# Patient Record
Sex: Female | Born: 1935 | Race: White | Hispanic: No | Smoking: Former smoker
Health system: Southern US, Community
[De-identification: ages and names within clinical notes are randomized; demographics above are authoritative.]

## PROBLEM LIST (undated history)

## (undated) DIAGNOSIS — K219 Gastro-esophageal reflux disease without esophagitis: Secondary | ICD-10-CM

## (undated) DIAGNOSIS — G609 Hereditary and idiopathic neuropathy, unspecified: Secondary | ICD-10-CM

## (undated) DIAGNOSIS — Z85828 Personal history of other malignant neoplasm of skin: Secondary | ICD-10-CM

## (undated) DIAGNOSIS — M1990 Unspecified osteoarthritis, unspecified site: Secondary | ICD-10-CM

## (undated) DIAGNOSIS — K819 Cholecystitis, unspecified: Secondary | ICD-10-CM

## (undated) DIAGNOSIS — H269 Unspecified cataract: Secondary | ICD-10-CM

## (undated) DIAGNOSIS — T4145XA Adverse effect of unspecified anesthetic, initial encounter: Secondary | ICD-10-CM

## (undated) DIAGNOSIS — I499 Cardiac arrhythmia, unspecified: Secondary | ICD-10-CM

## (undated) HISTORY — DX: Hereditary and idiopathic neuropathy, unspecified: G60.9

## (undated) HISTORY — DX: Gastro-esophageal reflux disease without esophagitis: K21.9

## (undated) HISTORY — DX: Personal history of other malignant neoplasm of skin: Z85.828

## (undated) HISTORY — DX: Unspecified osteoarthritis, unspecified site: M19.90

## (undated) HISTORY — PX: TONSILLECTOMY: SUR1361

## (undated) HISTORY — PX: ABDOMINAL HYSTERECTOMY: SHX81

## (undated) HISTORY — PX: BREAST CYST ASPIRATION: SHX578

---

## 1997-12-13 ENCOUNTER — Other Ambulatory Visit: Admission: RE | Admit: 1997-12-13 | Discharge: 1997-12-13 | Payer: Self-pay | Admitting: Gynecology

## 1998-12-19 ENCOUNTER — Other Ambulatory Visit: Admission: RE | Admit: 1998-12-19 | Discharge: 1998-12-19 | Payer: Self-pay | Admitting: Gynecology

## 1999-12-17 ENCOUNTER — Encounter: Payer: Self-pay | Admitting: Gynecology

## 1999-12-17 ENCOUNTER — Encounter: Admission: RE | Admit: 1999-12-17 | Discharge: 1999-12-17 | Payer: Self-pay | Admitting: Gynecology

## 1999-12-30 ENCOUNTER — Other Ambulatory Visit: Admission: RE | Admit: 1999-12-30 | Discharge: 1999-12-30 | Payer: Self-pay | Admitting: Gynecology

## 2000-12-19 ENCOUNTER — Encounter: Payer: Self-pay | Admitting: Gynecology

## 2000-12-19 ENCOUNTER — Encounter: Admission: RE | Admit: 2000-12-19 | Discharge: 2000-12-19 | Payer: Self-pay | Admitting: Gynecology

## 2001-01-04 ENCOUNTER — Other Ambulatory Visit: Admission: RE | Admit: 2001-01-04 | Discharge: 2001-01-04 | Payer: Self-pay | Admitting: Gynecology

## 2001-12-05 ENCOUNTER — Encounter: Admission: RE | Admit: 2001-12-05 | Discharge: 2001-12-05 | Payer: Self-pay | Admitting: Gynecology

## 2001-12-05 ENCOUNTER — Encounter: Payer: Self-pay | Admitting: Gynecology

## 2002-01-02 ENCOUNTER — Ambulatory Visit (HOSPITAL_COMMUNITY): Admission: RE | Admit: 2002-01-02 | Discharge: 2002-01-02 | Payer: Self-pay | Admitting: Gastroenterology

## 2002-01-08 ENCOUNTER — Other Ambulatory Visit: Admission: RE | Admit: 2002-01-08 | Discharge: 2002-01-08 | Payer: Self-pay | Admitting: Gynecology

## 2002-02-06 ENCOUNTER — Ambulatory Visit (HOSPITAL_COMMUNITY): Admission: RE | Admit: 2002-02-06 | Discharge: 2002-02-06 | Payer: Self-pay | Admitting: Gastroenterology

## 2002-12-11 ENCOUNTER — Encounter: Payer: Self-pay | Admitting: Gynecology

## 2002-12-11 ENCOUNTER — Encounter: Admission: RE | Admit: 2002-12-11 | Discharge: 2002-12-11 | Payer: Self-pay | Admitting: Gynecology

## 2002-12-14 ENCOUNTER — Encounter: Payer: Self-pay | Admitting: Gynecology

## 2002-12-14 ENCOUNTER — Encounter: Admission: RE | Admit: 2002-12-14 | Discharge: 2002-12-14 | Payer: Self-pay | Admitting: Gynecology

## 2003-06-21 ENCOUNTER — Encounter (INDEPENDENT_AMBULATORY_CARE_PROVIDER_SITE_OTHER): Payer: Self-pay | Admitting: Specialist

## 2003-06-21 ENCOUNTER — Observation Stay (HOSPITAL_COMMUNITY): Admission: RE | Admit: 2003-06-21 | Discharge: 2003-06-22 | Payer: Self-pay | Admitting: Gynecology

## 2004-01-02 ENCOUNTER — Encounter: Admission: RE | Admit: 2004-01-02 | Discharge: 2004-01-02 | Payer: Self-pay | Admitting: Gynecology

## 2004-01-30 ENCOUNTER — Other Ambulatory Visit: Admission: RE | Admit: 2004-01-30 | Discharge: 2004-01-30 | Payer: Self-pay | Admitting: Gynecology

## 2005-01-06 ENCOUNTER — Encounter: Admission: RE | Admit: 2005-01-06 | Discharge: 2005-01-06 | Payer: Self-pay | Admitting: Gynecology

## 2005-01-22 ENCOUNTER — Encounter: Admission: RE | Admit: 2005-01-22 | Discharge: 2005-01-22 | Payer: Self-pay | Admitting: Gynecology

## 2005-04-26 DIAGNOSIS — H269 Unspecified cataract: Secondary | ICD-10-CM

## 2005-04-26 HISTORY — DX: Unspecified cataract: H26.9

## 2005-04-26 HISTORY — PX: EYE SURGERY: SHX253

## 2005-07-28 ENCOUNTER — Encounter: Admission: RE | Admit: 2005-07-28 | Discharge: 2005-07-28 | Payer: Self-pay | Admitting: Gynecology

## 2006-01-18 ENCOUNTER — Encounter: Admission: RE | Admit: 2006-01-18 | Discharge: 2006-01-18 | Payer: Self-pay | Admitting: Gynecology

## 2006-02-08 ENCOUNTER — Other Ambulatory Visit: Admission: RE | Admit: 2006-02-08 | Discharge: 2006-02-08 | Payer: Self-pay | Admitting: Gynecology

## 2006-05-11 ENCOUNTER — Ambulatory Visit: Payer: Self-pay | Admitting: Internal Medicine

## 2007-01-24 ENCOUNTER — Encounter: Admission: RE | Admit: 2007-01-24 | Discharge: 2007-01-24 | Payer: Self-pay | Admitting: Gynecology

## 2007-01-31 ENCOUNTER — Ambulatory Visit: Payer: Self-pay | Admitting: Family Medicine

## 2007-01-31 DIAGNOSIS — H8110 Benign paroxysmal vertigo, unspecified ear: Secondary | ICD-10-CM | POA: Insufficient documentation

## 2008-01-30 ENCOUNTER — Encounter: Admission: RE | Admit: 2008-01-30 | Discharge: 2008-01-30 | Payer: Self-pay | Admitting: Gynecology

## 2008-03-20 ENCOUNTER — Encounter: Admission: RE | Admit: 2008-03-20 | Discharge: 2008-03-20 | Payer: Self-pay | Admitting: Gastroenterology

## 2008-04-11 ENCOUNTER — Ambulatory Visit: Payer: Self-pay | Admitting: Internal Medicine

## 2008-04-11 LAB — CONVERTED CEMR LAB
BUN: 11 mg/dL (ref 6–23)
CO2: 20 meq/L (ref 19–32)
Calcium: 9 mg/dL (ref 8.4–10.5)
Chloride: 101 meq/L (ref 96–112)
Creatinine, Ser: 0.71 mg/dL (ref 0.40–1.20)
Glucose, Bld: 101 mg/dL — ABNORMAL HIGH (ref 70–99)
Potassium: 4.9 meq/L (ref 3.5–5.3)
Sodium: 137 meq/L (ref 135–145)

## 2008-08-07 ENCOUNTER — Ambulatory Visit: Payer: Self-pay | Admitting: Family Medicine

## 2008-08-07 DIAGNOSIS — J209 Acute bronchitis, unspecified: Secondary | ICD-10-CM | POA: Insufficient documentation

## 2009-01-30 ENCOUNTER — Encounter: Admission: RE | Admit: 2009-01-30 | Discharge: 2009-01-30 | Payer: Self-pay | Admitting: Gynecology

## 2009-02-11 ENCOUNTER — Encounter: Payer: Self-pay | Admitting: Internal Medicine

## 2009-02-12 ENCOUNTER — Encounter: Payer: Self-pay | Admitting: *Deleted

## 2009-02-12 LAB — CONVERTED CEMR LAB
Cholesterol: 259 mg/dL
Direct LDL: 153 mg/dL
Triglycerides: 195 mg/dL

## 2009-02-20 ENCOUNTER — Encounter: Payer: Self-pay | Admitting: Internal Medicine

## 2009-02-26 ENCOUNTER — Encounter: Payer: Self-pay | Admitting: *Deleted

## 2009-06-27 ENCOUNTER — Encounter: Payer: Self-pay | Admitting: Internal Medicine

## 2009-07-14 ENCOUNTER — Encounter: Admission: RE | Admit: 2009-07-14 | Discharge: 2009-07-14 | Payer: Self-pay | Admitting: Gastroenterology

## 2009-09-09 ENCOUNTER — Telehealth: Payer: Self-pay | Admitting: Internal Medicine

## 2010-02-03 ENCOUNTER — Encounter: Admission: RE | Admit: 2010-02-03 | Discharge: 2010-02-03 | Payer: Self-pay | Admitting: Gynecology

## 2010-02-10 ENCOUNTER — Encounter: Admission: RE | Admit: 2010-02-10 | Discharge: 2010-02-10 | Payer: Self-pay | Admitting: Gynecology

## 2010-05-04 ENCOUNTER — Ambulatory Visit
Admission: RE | Admit: 2010-05-04 | Discharge: 2010-05-04 | Payer: Self-pay | Source: Home / Self Care | Attending: Internal Medicine | Admitting: Internal Medicine

## 2010-05-15 ENCOUNTER — Ambulatory Visit
Admission: RE | Admit: 2010-05-15 | Discharge: 2010-05-15 | Payer: Self-pay | Source: Home / Self Care | Attending: Internal Medicine | Admitting: Internal Medicine

## 2010-05-15 DIAGNOSIS — M6740 Ganglion, unspecified site: Secondary | ICD-10-CM | POA: Insufficient documentation

## 2010-05-15 DIAGNOSIS — M1990 Unspecified osteoarthritis, unspecified site: Secondary | ICD-10-CM | POA: Insufficient documentation

## 2010-05-15 DIAGNOSIS — M25676 Stiffness of unspecified foot, not elsewhere classified: Secondary | ICD-10-CM

## 2010-05-15 DIAGNOSIS — M25673 Stiffness of unspecified ankle, not elsewhere classified: Secondary | ICD-10-CM | POA: Insufficient documentation

## 2010-05-16 ENCOUNTER — Encounter: Payer: Self-pay | Admitting: Gynecology

## 2010-05-17 ENCOUNTER — Encounter: Payer: Self-pay | Admitting: Gynecology

## 2010-05-20 ENCOUNTER — Encounter: Payer: Self-pay | Admitting: Internal Medicine

## 2010-05-26 NOTE — Assessment & Plan Note (Signed)
Summary: pT GOT UP TODAY UNEASY ONLEGS/DIZZY/NN   Vital Signs:  Patient Profile:   77 Years Old Female Weight:      150 pounds Temp:     98.5 degrees F oral Pulse rate:   68 / minute Pulse rhythm:   regular BP sitting:   132 / 74  (left arm) Cuff size:   regular  Vitals Entered By: Alfred Levins, CMA (January 31, 2007 11:46 AM)                 Chief Complaint:  dizzy yesterday and today and some nausea.  History of Present Illness: 2 days of dizziness when she moves, is fine when she is still. Gets a little nauseated. No fever or HA.  Current Allergies: ! TERRAMYCIN     Review of Systems      See HPI   Physical Exam  General:     Well-developed,well-nourished,in no acute distress; alert,appropriate and cooperative throughout examination Head:     Normocephalic and atraumatic without obvious abnormalities. No apparent alopecia or balding. Eyes:     No corneal or conjunctival inflammation noted. EOMI. Perrla. Funduscopic exam benign, without hemorrhages, exudates or papilledema. Vision grossly normal. Ears:     External ear exam shows no significant lesions or deformities.  Otoscopic examination reveals clear canals, tympanic membranes are intact bilaterally without bulging, retraction, inflammation or discharge. Hearing is grossly normal bilaterally. Neurologic:     No cranial nerve deficits noted. Station and gait are normal. Plantar reflexes are down-going bilaterally. DTRs are symmetrical throughout. Sensory, motor and coordinative functions appear intact.    Impression & Recommendations:  Problem # 1:  VERTIGO, BENIGN PAROXYSMAL POSITION (ICD-386.11)  Complete Medication List: 1)  Celebrex 200 Mg Caps (Celecoxib) .Marland Kitchen.. 1 by mouth once daily 2)  Pepcid 40 Mg Tabs (Famotidine) .Marland Kitchen.. 1 by mouth two times a day 3)  Vivelle-dot 0.0375 Mg/24hr Pttw (Estradiol) .... Once daily 4)  Multivitamins Tabs (Multiple vitamin) .Marland Kitchen.. 1 by mouth once daily   Patient  Instructions: 1)  Please schedule a follow-up appointment as needed.    ]

## 2010-05-26 NOTE — Assessment & Plan Note (Signed)
Summary: coughing up yellow muscus/jls   Vital Signs:  Patient profile:   77 year old female Weight:      158 pounds Temp:     97.9 degrees F oral BP sitting:   128 / 80  (left arm) Cuff size:   regular  Vitals Entered By: Alfred Levins, CMA (August 07, 2008 11:13 AM) CC: cough, congestion   History of Present Illness: Here with her husband since they both have a had cough. They just returned from a cruise to the [place] yesterday. One week ago she developed this cough which produces yellow sputum. No chest pains or fever or ST.   Allergies: 1)  ! Terramycin  Past History:  Past Medical History:    Reviewed history from 12/12/2006 and no changes required:    UTI's  Review of Systems  The patient denies anorexia, fever, weight loss, weight gain, vision loss, decreased hearing, hoarseness, chest pain, syncope, dyspnea on exertion, peripheral edema, headaches, hemoptysis, abdominal pain, melena, hematochezia, severe indigestion/heartburn, hematuria, incontinence, genital sores, muscle weakness, suspicious skin lesions, transient blindness, difficulty walking, depression, unusual weight change, abnormal bleeding, enlarged lymph nodes, angioedema, breast masses, and testicular masses.    Physical Exam  General:  Well-developed,well-nourished,in no acute distress; alert,appropriate and cooperative throughout examination Head:  Normocephalic and atraumatic without obvious abnormalities. No apparent alopecia or balding. Eyes:  No corneal or conjunctival inflammation noted. EOMI. Perrla. Funduscopic exam benign, without hemorrhages, exudates or papilledema. Vision grossly normal. Ears:  External ear exam shows no significant lesions or deformities.  Otoscopic examination reveals clear canals, tympanic membranes are intact bilaterally without bulging, retraction, inflammation or discharge. Hearing is grossly normal bilaterally. Nose:  External nasal examination shows no deformity or  inflammation. Nasal mucosa are pink and moist without lesions or exudates. Mouth:  Oral mucosa and oropharynx without lesions or exudates.  Teeth in good repair. Neck:  No deformities, masses, or tenderness noted. Lungs:  Normal respiratory effort, chest expands symmetrically. Lungs are clear to auscultation, no crackles or wheezes.   Impression & Recommendations:  Problem # 1:  ACUTE BRONCHITIS (ICD-466.0)  Her updated medication list for this problem includes:    Zithromax Z-pak 250 Mg Tabs (Azithromycin) .Marland Kitchen... As directed    Tussionex Pennkinetic Er 8-10 Mg/16ml Lqcr (Chlorpheniramine-hydrocodone) .Marland Kitchen... 1 tsp q 4 hours as needed cough  Complete Medication List: 1)  Celebrex 200 Mg Caps (Celecoxib) .Marland Kitchen.. 1 by mouth once daily 2)  Pepcid 40 Mg Tabs (Famotidine) .Marland Kitchen.. 1 by mouth two times a day 3)  Vivelle-dot 0.0375 Mg/24hr Pttw (Estradiol) .... Once daily 4)  Multivitamins Tabs (Multiple vitamin) .Marland Kitchen.. 1 by mouth once daily 5)  Zithromax Z-pak 250 Mg Tabs (Azithromycin) .... As directed 6)  Tussionex Pennkinetic Er 8-10 Mg/55ml Lqcr (Chlorpheniramine-hydrocodone) .Marland Kitchen.. 1 tsp q 4 hours as needed cough  Patient Instructions: 1)  Please schedule a follow-up appointment as needed .  Prescriptions: TUSSIONEX PENNKINETIC ER 8-10 MG/5ML LQCR (CHLORPHENIRAMINE-HYDROCODONE) 1 tsp q 4 hours as needed cough  #240 ml x 0   Entered and Authorized by:   Nelwyn Salisbury MD   Signed by:   Nelwyn Salisbury MD on 08/07/2008   Method used:   Print then Give to Patient   RxID:   [id number] Z-PAK 250 MG TABS (AZITHROMYCIN) as directed  #1 x 0   Entered and Authorized by:   Nelwyn Salisbury MD   Signed by:   Nelwyn Salisbury MD on 08/07/2008   Method used:  Print then Give to Patient   RxID:   [id number]

## 2010-05-26 NOTE — Miscellaneous (Signed)
Summary: labs done by Dr. Nicholas Lose  Clinical Lists Changes  Observations: Added new observation of LDL DIR: 153 mg/dL (29/56/2130 8:65) Added new observation of TRIGLYC TOT: 195 mg/dL (78/46/9629 5:28) Added new observation of CHOLESTEROL: 259 mg/dL (41/32/4401 0:27)

## 2010-05-26 NOTE — Progress Notes (Signed)
Summary: cholesterol recommendations  Phone Note Call from Patient   Caller: Patient Call For: Bensimhon Summary of Call: Pt had cholesterol labwork drawn at Dr Nicholas Lose office and results faxed to [place] per Dr Bensimhon's request.  Pt unsure what Dr [name] wants pt to do, d/c Simvastating d/t leg cramping and aching.  Dr [name] requested lab results be faxed and he would monitor and rx medications.  Pt has not heard from anyone about these results.  Results scanned in EMR 06/27/09.  Please advise.   Initial call taken by: Cloyde Reams RN,  Sep 09, 2009 10:15 AM  Follow-up for Phone Call        LDL way up. unable to tolerate simva. start crestor 5. Dolores Patty, MD, [facility]  Sep 09, 2009 7:16 PM      Appended Document: cholesterol recommendations Attempted to contact pt and advise of Dr Bensimhon's recommendations. LMOM TCB.  EWJ  Appended Document: cholesterol recommendations Called swpoke with pt, aware of Dr Bensimhon's recommendations, will call Dr Nicholas Lose to get him to Rx.  EWJ

## 2010-05-26 NOTE — Letter (Signed)
Summary: Gynecology-Dr. Nicholas Lose  Gynecology-Dr. Lomax   Imported By: Maryln Gottron 02/27/2009 13:32:38  _____________________________________________________________________  External Attachment:    Type:   Image     Comment:   External Document

## 2010-05-28 NOTE — Letter (Signed)
Summary: PATIENT HX FORM  PATIENT HX FORM   Imported By: Georgian Co 05/20/2010 09:53:41  _____________________________________________________________________  External Attachment:    Type:   Image     Comment:   External Document

## 2010-05-28 NOTE — Assessment & Plan Note (Signed)
Summary: feet issues//ccm/pt rescd per dr//ccm   Vital Signs:  Patient profile:   77 year old female Height:      65 inches Weight:      155 pounds BMI:     25.89 Temp:     98.7 degrees F oral Pulse rate:   78 / minute BP sitting:   120 / 70  (left arm) Cuff size:   regular  Vitals Entered By: Romualdo Bolk, CMA (AAMA) (May 15, 2010 11:52 AM) CC: bilateral feet stiffness that is worse during the night x 6 months. Pt also has a cyst on rt wrist.   History of Present Illness: Susan Ford comes in today   as a SDA work in for the above problem. Her last visit was me was before 2008. She's been getting her routine care through Dr. Nicholas Lose her OB/GYN. She's been doing fairly well but wanted an opinion on her feet.  Lomax   treating her with Celebrex for arthritis fingers thum and back .Marland Kitchen...and has seen Dr Priscille Kluver for back  paoin  arthritis    She had an  MRI.  and was told there was no surgical problem. She has a remote history of injury to coccyx bones.  GERD: she takesPepcidAc  40 two times a day every day.  needs to take every day  HRT: she's on hormone replacement and slowly going down on the dose. She comes in today because of concern on the six-month problem with her feet and is gotten worse. She describes it as stiffness in her toes makes it difficult to walk when she gets up. However there is no balance problems specific numbness following injury or pain. No burning. She has no history of diabetes. she wears reasonably good's shoes and uses no treatment for this. Takes vitamins d c  Has non tender wrist on right wrist  nor months  no numbness or weakness.     Preventive Screening-Counseling & Management  Alcohol-Tobacco     Smoking Status: quit  Caffeine-Diet-Exercise     Does Patient Exercise: yes     Times/week: 3  Hep-HIV-STD-Contraception     Sun Exposure-Excessive: no  Safety-Violence-Falls     Firearms in the Home: no firearms in the home     Smoke  Detectors: yes     Fall Risk: no   Current Medications (verified): 1)  Celebrex 200 Mg  Caps (Celecoxib) .Marland Kitchen.. 1 By Mouth Once Daily 2)  Pepcid 40 Mg  Tabs (Famotidine) .Marland Kitchen.. 1 By Mouth Two Times A Day 3)  Vivelle-Dot 0.0375 Mg/24hr  Pttw (Estradiol) .... Once Daily 4)  Multivitamins   Tabs (Multiple Vitamin) .Marland Kitchen.. 1 By Mouth Once Daily 5)  Calcium 1500 Mg Tabs (Calcium Carbonate) 6)  Fish Oil 1000 Mg Caps (Omega-3 Fatty Acids) 7)  Vitamin D 400 Unit Tabs (Cholecalciferol) 8)  Vitamin C 500 Mg  Tabs (Ascorbic Acid)  Allergies (verified): 1)  ! Terramycin  Past History:  Family History: Last updated: 05/15/2010 Family History of Alcoholism/Addiction Family History of Cardiovascular disorder Mom died of Shy drager age 52 Father died compl cations of surgery age 61   Social History: Last updated: 05/15/2010 Retired Charity fundraiser  Married Former Smoker Alcohol use-yes Drug use-no Regular exercise-yes  Hhof 2 no pets.   Past Medical History: G3 P3  UTI's GERD  Endoscopy     no ulcer of barrets. Arthritis DJD  Past Surgical History: Hysterectomy benign reason  Past History:  Care Management: Gastroenterology:medoff.  Gynecology:Lomax  HAs been doing Primary care   Family History: Family History of Alcoholism/Addiction Family History of Cardiovascular disorder Mom died of Shy drager age 30 Father died compl cations of surgery age 97   Social History: Retired Charity fundraiser  Married Former Smoker Alcohol use-yes Drug use-no Regular exercise-yes  Hhof 2 no pets.  Fall Risk:  no  Sun Exposure-Excessive:  no  Review of Systems  The patient denies anorexia, fever, weight loss, weight gain, vision loss, decreased hearing, hoarseness, chest pain, syncope, dyspnea on exertion, peripheral edema, prolonged cough, headaches, hemoptysis, abdominal pain, melena, hematochezia, severe indigestion/heartburn, transient blindness, depression, abnormal bleeding, enlarged lymph nodes, and  angioedema.    Physical Exam  General:  Well-developed,well-nourished,in no acute distress; alert,appropriate and cooperative throughout examination Head:  normocephalic, atraumatic, and no abnormalities observed.   Eyes:  PERRL, EOMs full, conjunctiva clear  Ears:  R ear normal and L ear normal.   Neck:  No deformities, masses, or tenderness noted. Lungs:  Normal respiratory effort, chest expands symmetrically. Lungs are clear to auscultation, no crackles or wheezes. Heart:  Normal rate and regular rhythm. S1 and S2 normal without gallop, murmur, click, rub or other extra sounds. Msk:  righ tsrist with 1. 5 cm ganaglion wrist. Pulses:  pulses intact without delay   Extremities:  no clubbing cyanosis or edema  toes  middle toes with slight hammer deformity but flexible and nl rom and strength  Neurologic:  Pt is A&Ox3,affect,speech,memory,attention,&motor skills appear intact. alert & oriented X3, cranial nerves II-XII intact, strength normal in all extremities, gait normal, and DTRs symmetrical and normal.  sense to monofilament is normal. Decrease vibration sense in both distal feet  all toeds and distal foot    Skin:  no ulcers or unusual calluses on feet  Cervical Nodes:  No lymphadenopathy noted Psych:  Oriented X3, good eye contact, not anxious appearing, and not depressed appearing.   patient  entry hx updated and reviewed   Impression & Recommendations:  Problem # 1:  STIFFNESS OF JOINT NEC ANKLE AND FOOT (ICD-719.57) toes    with decreased  vibratory sense and poss early hammer toe issues .... no other neuropathy  issues  and rest of exam unrevealing ... she  has been cared for by gyne for her arthtritis and apparently  had rheumatic disease rulled out .  I dont have those records as this appt is a work in  request today.   need labs     and b12  to be done in the near future  anc consider consult with foot and ankle specialist   Problem # 2:  GANGLION CYST, WRIST, RIGHT  (ICD-727.41) observe    no symptoms   Complete Medication List: 1)  Celebrex 200 Mg Caps (Celecoxib) .Marland Kitchen.. 1 by mouth once daily 2)  Pepcid 40 Mg Tabs (Famotidine) .Marland Kitchen.. 1 by mouth two times a day 3)  Vivelle-dot 0.0375 Mg/24hr Pttw (Estradiol) .... Once daily 4)  Multivitamins Tabs (Multiple vitamin) .Marland Kitchen.. 1 by mouth once daily 5)  Calcium 1500 Mg Tabs (Calcium carbonate) 6)  Fish Oil 1000 Mg Caps (Omega-3 fatty acids) 7)  Vitamin D 400 Unit Tabs (Cholecalciferol) 8)  Vitamin C 500 Mg Tabs (Ascorbic acid)  Other Orders: Tdap => 79yrs IM (04540) Admin 1st Vaccine (98119)  Patient Instructions: 1)  sign a release to get a copy of the MRI report 2)  also  any labs done per Dr Nicholas Lose .    3)  request he make sure does  B12 level and folic acid level. as well as blood sugar to r/o diabetes.  and thyroid test.  4)  Consider seeing  foot specialist    ortho .    good foot wear.   5)  You also have a ganglion cyst  that  if not bothering you could leave alone.   Orders Added: 1)  Tdap => 48yrs IM [90715] 2)  Admin 1st Vaccine [90471] 3)  Est. Patient Level IV [04540]   Immunizations Administered:  Tetanus Vaccine:    Vaccine Type: Tdap    Site: right deltoid    Mfr: GlaxoSmithKline    Dose: 0.5 ml    Route: IM    Given by: Romualdo Bolk, CMA (AAMA)    Exp. Date: 02/13/2012    Lot #: [id number]   Immunizations Administered:  Tetanus Vaccine:    Vaccine Type: Tdap    Site: right deltoid    Mfr: GlaxoSmithKline    Dose: 0.5 ml    Route: IM    Given by: Romualdo Bolk, CMA (AAMA)    Exp. Date: 02/13/2012    Lot #: [id number]

## 2010-05-28 NOTE — Assessment & Plan Note (Signed)
Summary: COUGH AND  CONGESTION//SLM   Vital Signs:  Patient profile:   77 year old female Weight:      152 pounds Pulse rate:   74 / minute BP sitting:   118 / 78  (left arm)  Vitals Entered By: Kyung Rudd, CMA (May 04, 2010 10:12 AM) CC: productive cough x 1 week. Yellow phlem yesterday. been taking mucinex dm   CC:  productive cough x 1 week. Yellow phlem yesterday. been taking mucinex dm.  History of Present Illness: Patient presents to clinic as a workin for evaluation of cough. Notes 8d h/o cough productive for yellow sputum and nasal congestion/drainage. Attempting otc medication such as mucinex without significant improvement. +sick exposure. No alleviating or exacerbating factors.  Denies dyspnea, wheezing, f/c.   Current Medications (verified): 1)  Celebrex 200 Mg  Caps (Celecoxib) .Marland Kitchen.. 1 By Mouth Once Daily 2)  Pepcid 40 Mg  Tabs (Famotidine) .Marland Kitchen.. 1 By Mouth Two Times A Day 3)  Vivelle-Dot 0.0375 Mg/24hr  Pttw (Estradiol) .... Once Daily 4)  Multivitamins   Tabs (Multiple Vitamin) .Marland Kitchen.. 1 By Mouth Once Daily  Allergies (verified): 1)  ! Terramycin  Past History:  Past medical, surgical, family and social histories (including risk factors) reviewed for relevance to current acute and chronic problems.  Past Medical History: Reviewed history from 12/12/2006 and no changes required. UTI's  Past Surgical History: Reviewed history from 12/12/2006 and no changes required. Hysterectomy  Family History: Reviewed history from 12/12/2006 and no changes required. Family History of Alcoholism/Addiction Family History of Cardiovascular disorder  Social History: Reviewed history from 12/12/2006 and no changes required. Retired Married Former Smoker Alcohol use-yes Drug use-no Regular exercise-yes  Review of Systems      See HPI General:  Denies chills, fever, and sweats. Eyes:  Denies eye irritation, eye pain, and red eye. ENT:  Complains of nasal  congestion and postnasal drainage; denies ear discharge, earache, and sore throat. Resp:  Complains of cough and sputum productive; denies coughing up blood, shortness of breath, and wheezing.  Physical Exam  General:  Well-developed,well-nourished,in no acute distress; alert,appropriate and cooperative throughout examination Head:  Normocephalic and atraumatic without obvious abnormalities. No apparent alopecia or balding. Eyes:  pupils equal, pupils round, corneas and lenses clear, and no injection.   Ears:  External ear exam shows no significant lesions or deformities.  Otoscopic examination reveals clear canals, tympanic membranes are intact bilaterally without bulging, retraction, inflammation or discharge. Hearing is grossly normal bilaterally. Nose:  External nasal examination shows no deformity or inflammation. Nasal mucosa are pink and moist without lesions or exudates. Mouth:  Oral mucosa and oropharynx without lesions or exudates.  Teeth in good repair. Lungs:  Normal respiratory effort, chest expands symmetrically. Lungs are clear to auscultation, no crackles or wheezes. Heart:  Normal rate and regular rhythm. S1 and S2 normal without gallop, murmur, click, rub or other extra sounds. Skin:  turgor normal, color normal, and no rashes.     Impression & Recommendations:  Problem # 1:  URI (ICD-465.9) Assessment New Continue otc symptomatic tx (examples given.) Given abx prescription to hold. Begin if no improvement of symptoms after 48 hours. Followup if no improvement or worsening.  Complete Medication List: 1)  Celebrex 200 Mg Caps (Celecoxib) .Marland Kitchen.. 1 by mouth once daily 2)  Pepcid 40 Mg Tabs (Famotidine) .Marland Kitchen.. 1 by mouth two times a day 3)  Vivelle-dot 0.0375 Mg/24hr Pttw (Estradiol) .... Once daily 4)  Multivitamins Tabs (Multiple vitamin) .Marland KitchenMarland KitchenMarland Kitchen  1 by mouth once daily 5)  Zithromax Z-pak 250 Mg Tabs (Azithromycin) .... As directed Prescriptions: ZITHROMAX Z-PAK 250 MG TABS  (AZITHROMYCIN) as directed  #1 x 0   Entered and Authorized by:   Edwyna Perfect MD   Signed by:   Edwyna Perfect MD on 05/04/2010   Method used:   Print then Give to Patient   RxID:   [id number]    Orders Added: 1)  Est. Patient Level III [14782]

## 2010-06-02 ENCOUNTER — Telehealth: Payer: Self-pay | Admitting: Internal Medicine

## 2010-06-02 NOTE — Telephone Encounter (Signed)
Pt called and said that Dr Nicholas Lose is req pt to have extra test done. There are 4 test, b-12 lvl, folic acid lvl, blood sugar and thyroid test. Pt says that need code #'s for these test.  Pls call pt and give the codes or call Dr Nicholas Lose office [phone].

## 2010-06-02 NOTE — Telephone Encounter (Signed)
Per Dr. Fabian Sharp- Use the diagnosis of 356.9, 719.57, 715.90 and 386.11.

## 2010-06-02 NOTE — Telephone Encounter (Signed)
Rx sent to Dr. Alphonse Guild

## 2010-06-09 ENCOUNTER — Telehealth: Payer: Self-pay | Admitting: *Deleted

## 2010-06-09 NOTE — Telephone Encounter (Signed)
Dr. Nicholas Lose did not get the fax for the labs.  Please refax.

## 2010-06-09 NOTE — Telephone Encounter (Signed)
Rx faxed to Dr. Marolyn Hammock office

## 2010-07-06 ENCOUNTER — Ambulatory Visit (INDEPENDENT_AMBULATORY_CARE_PROVIDER_SITE_OTHER): Payer: Medicare Other | Admitting: Internal Medicine

## 2010-07-06 ENCOUNTER — Encounter: Payer: Self-pay | Admitting: Internal Medicine

## 2010-07-06 VITALS — BP 128/80 | HR 124 | Temp 98.4°F | Ht 65.0 in | Wt 150.0 lb

## 2010-07-06 DIAGNOSIS — S300XXA Contusion of lower back and pelvis, initial encounter: Secondary | ICD-10-CM

## 2010-07-06 DIAGNOSIS — R05 Cough: Secondary | ICD-10-CM

## 2010-07-06 DIAGNOSIS — R059 Cough, unspecified: Secondary | ICD-10-CM

## 2010-07-06 DIAGNOSIS — W19XXXA Unspecified fall, initial encounter: Secondary | ICD-10-CM

## 2010-07-06 DIAGNOSIS — R58 Hemorrhage, not elsewhere classified: Secondary | ICD-10-CM

## 2010-07-06 DIAGNOSIS — M79609 Pain in unspecified limb: Secondary | ICD-10-CM

## 2010-07-06 DIAGNOSIS — Z9181 History of falling: Secondary | ICD-10-CM

## 2010-07-06 NOTE — Patient Instructions (Signed)
Fall prevention  Cold and time if hip  Is an issue  Then see dr Steva Colder think the cough if related to the fall .  Call if fever etc.

## 2010-07-06 NOTE — Progress Notes (Signed)
Subjective:    Patient ID: Susan Ford, female    DOB: 1929-08-01, 77 y.o.   MRN: [id number]  HPI Patient comes in today with husband for acute visit. Over the weekend two days ago she tripped over a step because she wasn't paying attention and fell forward over the step onto her left side. She was able to get up with no loss of consciousness they had pain on her left arm hip area and left breast. She denied loss of consciousness chest pain or shortness of breath or arrhythmia. She used hydrocodone that she had left over as needed a half for pain however since that time she developed a dry cough and was horse per day and is wondering our opinion if it is related or important. She denies shortness of breath or pleurisy. Denies any popping in her rib area she did use heat on her bruise on her left thigh.   In regard to her arthritis she is seeing Dr. Renae Fickle who is recommended baclofen half a pill and to begin on gabapentin. She asked advice  About theses meds  didn't take this after her fall cushioned was unclear about whether she should or not.  Past Medical History  Diagnosis Date  . GERD (gastroesophageal reflux disease)     Had endoscopy no history of Barrett's  . Arthritis    Past Surgical History  Procedure Date  . Abdominal hysterectomy     Nine reasons    reports that she has quit smoking. She does not have any smokeless tobacco history on file. She reports that she drinks alcohol. She reports that she does not use illicit drugs. family history includes Alcohol abuse in an unspecified family member; Heart disease in an unspecified family member; and Neurodegenerative disease in her mother. Allergies  Allergen Reactions  . Oxytetracycline     Review of Systems New shortness of breath chest pain except near her bruise fever chills change in vision hearing hemoptysis bleeding lymph nodes inability to walk. She denies headache or head injury    Objective:   Physical Exam In nad   Wd wn   Able to get up on table without difficultyAnd as no assistance.  HEENT: Normocephalic ;atraumatic , Eyes;  PERRL, EOMs  Full, lids and conjunctiva clear,,Ears: no deformities, canals nl, TM landmarks normal, Nose: no deformity or discharge  Mouth : OP clear without lesion or edema . Chest:  Clear to A&P without wheezes rales or rhonchi CV:  S1-S2 no gallops or murmurs peripheral perfusion is normal  Perfusion appears normal and no clubbing cyanosis or demob extremities Fading bruise over left UOQ breast left and no bpny tenderness  LEft thigh  With a fairly large subcutaneous ecchymoses without fluctuated about 8 cm. Good range of motion and no antalgic gait back seems stable. Neuro no gross deficits. Alert oriented no tremor no weakness       Assessment & Plan:  Fall  This appeared to be a simple trip is not a neurological cardiovascular cause discussed fall prevention and distraction Contusion Left by buttocks. Discussed future use cold or nothing not eat for acute contusion Cough not related  To above. This is probably either allergy or a viral infection alarm symptoms discussed to return. Under rx for Back and  Now on low dose baclofen and to add    100 gabapentin .   Tonight  counseld with ?s    About potential side effects And sedation with medications.  More than 50%  of visit  Was spent in counseling  25 minutes

## 2010-07-09 ENCOUNTER — Encounter: Payer: Self-pay | Admitting: Internal Medicine

## 2010-07-21 ENCOUNTER — Encounter: Payer: Self-pay | Admitting: Internal Medicine

## 2010-08-14 ENCOUNTER — Encounter: Payer: Self-pay | Admitting: Internal Medicine

## 2010-09-11 NOTE — Op Note (Signed)
NAME:  Susan Ford, Susan Ford                     ACCOUNT NO.:  [id number]   MEDICAL RECORD NO.:  [id number]                   PATIENT TYPE:  AMB   LOCATION:  DAY                                  FACILITY:  [facility]   PHYSICIAN:  Gretta Cool, M.D.              DATE OF BIRTH:  [birth year]   DATE OF PROCEDURE:  06/21/2003  DATE OF DISCHARGE:                                 OPERATIVE REPORT   PREOPERATIVE DIAGNOSES:  Atypical endometrial hyperplasia.   POSTOPERATIVE DIAGNOSES:  Atypical endometrial hyperplasia.   PROCEDURE:  Laparoscopic assisted hysterectomy, bilateral salpingo-  oophorectomy.   SURGEON:  Gretta Cool, M.D.   ASSISTANT:  Andres Ege, M.D.   ANESTHESIA:  General oral tracheal.   PROCEDURE:  Laparoscopically assisted vaginal hysterectomy, bilateral  salpingo-oophorectomy.   DESCRIPTION OF PROCEDURE:  Under excellent general anesthesia with the  patient prepped and draped in lithotomy position in [place] stirrups with a  Hulka tenaculum applied to the cervix and her bladder drained, a  subumbilical incision was made and the Veress cannula introduced. After  adequate pneumoperitoneum, the laparoscope trocar was introduced and pelvic  organs visualized. There was no evidence of other intraabdominal pathology.  Both ovaries appeared quite atrophic.  The tubes were then elevated so as to  allow cautery of the infundibulopelvic vessels with tripolar forceps.  The  pedicle was progressively resected to eventually include the round ligament  and the parametrial structures down to the level of the uterine vessels. At  this point, attention was turned to the vaginal portion of the procedure.  The patient repositioned for that portion. The cervix was grasped with a  single tooth tenaculum and filtrated with Xylocaine 1% with epinephrine.  The mucosa was then incised and the bladder pushed off the lower uterine  segment. The cul-de-sac was then entered and the  uterosacral and cardinal  ligaments progressively clamped, cut, sutured and tied with #0 Vicryl.  At  this point, the vesicovaginal plica was entered and Deaver placed beneath  the bladder.  The uterine vessels were then clamped, cut, sutured and tied  with #0 Vicryl.  At this point, the vaginal portion of the procedure was  communicated to the previous transection by laparoscopic assisted technique  and the uterus removed.  At this point, the pedicles were examined and there  was no evidence of bleeding. The peritoneum was closed, anterior peritoneum,  lateral pedicles, cul-de-sac by pursestring closure. At this point, a  uterosacral cardinal colposuspension was performed so as to suspend the  vaginal cuff and secure the cuff fascia to the angle of the vagina.  The  remainder of the envelope of fascia anteriorly and posteriorly was then  sutured anterior to posterior with interrupted subcuticular closures of 2-0  Vicryl.  A moderate enterocele was corrected by elevation of the fascia  posteriorly and reattachment to the vaginal cuff and angles of the vagina.  At the end of the  procedure, the cuff was very  well supported and  completely approximated. The procedure was terminated without complication  and patient repositioned for the abdominal portion of the procedure again.  The abdomen was reinflated with carbon dioxide and the pelvis irrigated with  lactated Ringer's.  Next the pedicles were examined, there was no bleeding.  The gas was then allowed to escape and instruments removed.  The incisions  were then infiltrated with Marcaine 0.5% and the fascia closed with deep  suture of 6-0 Vicryl at the 10 mm port.  The skin was closed with  interrupted sutures of 5-0 Vicryl and Steri-Strips.  At this point, the  procedure was terminated without complications.  The patient returned to the  recovery room in excellent condition.                                               Gretta Cool, M.D.    CWL/MEDQ  D:  06/21/2003  T:  06/21/2003  Job:  [id number]

## 2010-09-11 NOTE — H&P (Signed)
NAME:  Susan Ford, Susan Ford                     ACCOUNT NO.:  [id number]   MEDICAL RECORD NO.:  [id number]                   PATIENT TYPE:  AMB   LOCATION:  DAY                                  FACILITY:  [facility]   PHYSICIAN:  Gretta Cool, M.D.              DATE OF BIRTH:  1929/11/19   DATE OF ADMISSION:  06/21/2003  DATE OF DISCHARGE:                                HISTORY & PHYSICAL   CHIEF COMPLAINT:  Persistent endometrial hyperplasia.   HISTORY OF PRESENT ILLNESS:  Ms. Seltzer is a 77 year old, G3, P3 on  hormone replacement with some thickening of endometrium and intermittent  spotting. On ultrasound examination, she was found to have a very thickened  endometrium. She subsequently underwent D&C in the office and was found to  have complex hyperplasia with focal atypia.  She is now admitted for  definitive therapy. She has had extensive sampling with complete D&C rather  than simple sampling with little risk that she may have more than the D&C  has already determined. She is now admitted for definitive therapy by  laparoscopy assisted hysterectomy versus total thermal bilateral salpingo-  oophorectomy.   PAST MEDICAL HISTORY:  Usual childhood disease without sequelae.  Medical  illnesses none.   CURRENT MEDICATIONS:  HRT only.   PAST SURGICAL HISTORY:  None.   HOSPITALIZATIONS:  Only for childbirth.   FAMILY HISTORY:  Father died after surgery for nephrectomy.  Mother died of  a neuromuscular disease of unknown origin.  One sister is living and well.  No other known familial tendency.   REVIEW OF SYMPTOMS:  HEENT:  Denies symptoms.  __________  RESPIRATORY:  Denies asthma, cough, bronchitis, shortness of breath. GI/GU:  Denies  frequency, urgency, dysuria, change in bowel habits, food intolerance.   PHYSICAL EXAMINATION:  GENERAL:  Well-developed, well-nourished, tall white  female approximately ideal weight.  HEENT:  Pupils equal and reactive to light and  accommodation. Fundi not  examined. Oropharynx clear.  NECK:  Supple without mass or thyroid enlargement.  CHEST:  Clear P to A.  BREASTS:  Without mass, nodes or nipple discharge.  ABDOMEN:  Soft, __________ without mass or organomegaly.  PELVIC:  External genitalia normal female, vagina clean and rugose with good  estrogen effect.  The cervix is parous clean.  Uterus is normal size, shape  and contour.  Adnexa clear. Her uterus is reasonably mobile and considered  reasonable for laparoscopy assisted approach.  Rectovaginal exam confirms.  She has some minimal enterocele.  EXTREMITIES:  Negative.  NEUROLOGIC:  Physiologic.   IMPRESSION:  Atypical endometrial hyperplasia. Precancerous risk.   PLAN:  1. Laparoscopy assisted hysterectomy and bilateral salpingo-oophorectomy     verus total abdominal hysterectomy/bilateral salpingo-oophorectomy.  2. Chronic constipation.  Gretta Cool, M.D.    CWL/MEDQ  D:  06/21/2003  T:  06/21/2003  Job:  [id number]

## 2010-09-11 NOTE — Assessment & Plan Note (Signed)
[facility] OFFICE NOTE   Susan Ford, Susan Ford                  MRN:          [id number]  DATE:05/11/2006                            DOB:          [birth year]    CHIEF COMPLAINT:  New patient.  Acute cough, shortness of breath,  fatigue.   HISTORY OF PRESENT ILLNESS:  Susan Ford is a 77 year old, remote ex-  smoker, married, retired Astronomer., who comes in today for a first-time  visit.  She does not have a primary care physician in this area, but Dr.  Timoteo Gaul sees her husband.  She was in her general state of reasonably good  health, when she was on a cruise last week, had the onset of cough and  upper respiratory infection type symptoms, was given Thera-Flu type  medicines and felt a little bit better.  Although she had a persistent  cough, a day or two ago, she started to feel better.  However,  yesterday, last night, she had extreme fatigue, myalgias and shortness  of breath, associated with her dry, hacking cough.  She has no nausea or  vomiting, but feels like she could with her cough and has tried over-the-  counters, such as Coricidin, without significant help.  She does have  some purulent nasal discharge, but no significant face pain, a little  bit of blood from the discharge at times, but does not really think she  has a sinus infection.   PAST MEDICAL HISTORY:  See data base.  Hysterectomy, 2006.  Chicken pox  as a child.  UTIs.  Degenerative joint disease, on Celebrex.  Gravida 3,  para 3.  Last Pap October 2007.  LMP 1980.  Colonoscopy 2006.   MEDICATIONS:  1. Celebrex 200 a day.  2. Pepcid AQ 400 b.i.d.  3. Multivitamins.  4. Calcium.  5. Vivelle-Dot 0.0375.   DRUG ALLERGIES:  Terramycin.   FAMILY HISTORY:  Positive for heart disease in father.  A parent with  alcohol/drug problem.  Otherwise noncontributory.   REVIEW OF SYSTEMS:  She does not have a history of asthma or COPD.  Review of  systems as above, or as HPI.   SOCIAL HISTORY:  Originally from [place].  Trained at [employer].  Social  alcohol.  No history of tobacco.  Stopped in the 60's after ten years of  smoking.  Rest, see data base.  She did not have a flu shot this year.   OBJECTIVE:  Weight 157 pounds, temperature 99.2, pulse 105 and regular,  pulse-ox 96% on room air.  Blood pressure 120/80.  This is a well-  developed, well-nourished, 77 year old, who appears mildly ill, in no  acute distress, but is moderately hoarse and looks like she feels tired.  There is no acute dyspnea, increased respiratory rate at rest or  increased work of breathing.  Her color is good.  HEENT:  Normocephalic.  TM clear.  Eyes anicteric.  Nares:  +2  turbinates, without obvious discharge.  Face is nontender.  NECK:  Without masses, thyromegaly or bruit.  CHEST:  Breath sounds equal, but coarse, but no specific  rales or  wheezes are noted.  HEART:  Regular rhythm, no gallops or murmurs.  EXTREMITIES:  Negative clubbing, cyanosis or edema of extremities.  NEUROLOGIC:  Grossly intact.  She is articulate, ambulatory.   IMPRESSION:  Cough, malaise, shortness of breath, associated with  respiratory tract infection.  I worry about secondary pneumonia, versus  Mycoplasma, especially in her age group.  Although I suppose this  certainly could be viral, I would have expected her to not be relapsing  over a week into the illness.  I have discussed this with her, have  empirically begun azithromycin, Hycotuss 6 ounces 1-2 teaspoons every 4-  6 hours p.r.n. cough, and we will send her to [facility] to  get a chest x-ray, call report and basically plan followup after that as  appropriate.     Neta Mends. Panosh, MD  Electronically Signed    WKP/MedQ  DD: 05/11/2006  DT: 05/11/2006  Job #: [id number]

## 2010-10-20 ENCOUNTER — Encounter: Payer: Self-pay | Admitting: Internal Medicine

## 2010-10-20 ENCOUNTER — Ambulatory Visit (INDEPENDENT_AMBULATORY_CARE_PROVIDER_SITE_OTHER): Payer: Medicare Other | Admitting: Internal Medicine

## 2010-10-20 VITALS — BP 120/70 | HR 66 | Wt 148.5 lb

## 2010-10-20 DIAGNOSIS — L089 Local infection of the skin and subcutaneous tissue, unspecified: Secondary | ICD-10-CM | POA: Insufficient documentation

## 2010-10-20 DIAGNOSIS — IMO0002 Reserved for concepts with insufficient information to code with codable children: Secondary | ICD-10-CM

## 2010-10-20 MED ORDER — CEPHALEXIN 500 MG PO CAPS: 500.0000 mg | ORAL_CAPSULE | Freq: Three times a day (TID) | ORAL | Status: AC

## 2010-10-20 NOTE — Progress Notes (Signed)
  Subjective:    Patient ID: Susan Ford, female    DOB: 17-Apr-1930, 77 y.o.   MRN: [id number]  HPI Patient comes in today with husband for an acute visit. Injury June 23rd   Ran into cart at [facility] because she didn't see it and had an abrasion on her  Left lower leg.  It bled a good bit and she Cleaned with etoh and covered and then antibiotic ointment. Not kept it covered the whole time. l   . Yesterday noted increasing redness and swelling. No fever or streaking.  Fever and vomiting with   Terramycin.  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  Her tetanus shot is up to date given January 2012  Review of Systems Negative fever swollen glands systemic symptoms history of resistant skin germs.  Past history family history social history reviewed in the electronic medical record.     Objective:   Physical Exam WDWN in nad looks well. Here with husband  Left leg with about 3 cm triangular abrasion avulsion lesion with some yellowish base. There is some redness about 2-3 cm around this with some slight swelling. No sharp edge or streaking. Her gait is within normal limits. Area was dressed with antibiotic ointment and Telfa pad.       Assessment & Plan:  Avulsion abrasion with secondary infection early.  Allergic to oxytetracycline.  Discussed local care keeping covered wound healing treatment for infection and follow up. She is not high risk except for age.

## 2010-10-20 NOTE — Patient Instructions (Signed)
Take antibiotic and if get nausea from this can decrease to 2 x per day Gentle cleaning and antibiotic ointment like polysporin and keep covered with non stick  Dressing like telfa. Healing may take weeks but redness and swelling should be improved within  3-5 days. Call if worse or fails to heal correctly

## 2010-12-02 ENCOUNTER — Other Ambulatory Visit: Payer: Self-pay | Admitting: Gynecology

## 2010-12-02 DIAGNOSIS — Z1231 Encounter for screening mammogram for malignant neoplasm of breast: Secondary | ICD-10-CM

## 2010-12-25 ENCOUNTER — Other Ambulatory Visit: Payer: Self-pay | Admitting: Neurological Surgery

## 2010-12-25 ENCOUNTER — Ambulatory Visit
Admission: RE | Admit: 2010-12-25 | Discharge: 2010-12-25 | Disposition: A | Discharge status: Home / Self Care | Payer: Medicare Other | Source: Ambulatory Visit | Attending: Neurological Surgery | Admitting: Neurological Surgery

## 2010-12-25 DIAGNOSIS — M48061 Spinal stenosis, lumbar region without neurogenic claudication: Secondary | ICD-10-CM

## 2011-01-29 ENCOUNTER — Emergency Department (HOSPITAL_COMMUNITY)
Admission: EM | Admit: 2011-01-29 | Discharge: 2011-01-29 | Disposition: A | Discharge status: Home / Self Care | Payer: Medicare Other | Attending: Emergency Medicine | Admitting: Emergency Medicine

## 2011-01-29 ENCOUNTER — Telehealth: Payer: Self-pay | Admitting: Family Medicine

## 2011-01-29 ENCOUNTER — Ambulatory Visit: Payer: Medicare Other | Admitting: Family Medicine

## 2011-01-29 DIAGNOSIS — E871 Hypo-osmolality and hyponatremia: Secondary | ICD-10-CM | POA: Insufficient documentation

## 2011-01-29 DIAGNOSIS — R339 Retention of urine, unspecified: Secondary | ICD-10-CM | POA: Insufficient documentation

## 2011-01-29 LAB — COMPREHENSIVE METABOLIC PANEL
ALT: 14 U/L (ref 0–35)
AST: 16 U/L (ref 0–37)
Albumin: 3.5 g/dL (ref 3.5–5.2)
Alkaline Phosphatase: 58 U/L (ref 39–117)
BUN: 11 mg/dL (ref 6–23)
CO2: 26 mEq/L (ref 19–32)
Calcium: 9.1 mg/dL (ref 8.4–10.5)
Chloride: 91 mEq/L — ABNORMAL LOW (ref 96–112)
Creatinine, Ser: 0.52 mg/dL (ref 0.50–1.10)
GFR calc Af Amer: >90 mL/min (ref >90)
GFR calc non Af Amer: 89 mL/min — ABNORMAL LOW (ref >90)
Glucose, Bld: 105 mg/dL — ABNORMAL HIGH (ref 70–99)
Potassium: 4 mEq/L (ref 3.5–5.1)
Sodium: 127 mEq/L — ABNORMAL LOW (ref 135–145)
Total Bilirubin: 0.5 mg/dL (ref 0.3–1.2)
Total Protein: 6.6 g/dL (ref 6.0–8.3)

## 2011-01-29 LAB — URINALYSIS, ROUTINE W REFLEX MICROSCOPIC
Bilirubin Urine: NEGATIVE
Glucose, UA: NEGATIVE mg/dL
Hgb urine dipstick: NEGATIVE
Ketones, ur: NEGATIVE mg/dL
Leukocytes, UA: NEGATIVE
Nitrite: NEGATIVE
Protein, ur: NEGATIVE mg/dL
Specific Gravity, Urine: 1.019 (ref 1.005–1.030)
Urobilinogen, UA: 0.2 mg/dL (ref 0.0–1.0)
pH: 6.5 (ref 5.0–8.0)

## 2011-01-29 LAB — DIFFERENTIAL
Basophils Absolute: 0.1 10*3/uL (ref 0.0–0.1)
Basophils Relative: 1 % (ref 0–1)
Eosinophils Absolute: 0.3 10*3/uL (ref 0.0–0.7)
Eosinophils Relative: 3 % (ref 0–5)
Lymphocytes Relative: 25 % (ref 12–46)
Lymphs Abs: 2 10*3/uL (ref 0.7–4.0)
Monocytes Absolute: 0.7 10*3/uL (ref 0.1–1.0)
Monocytes Relative: 8 % (ref 3–12)
Neutro Abs: 4.9 10*3/uL (ref 1.7–7.7)
Neutrophils Relative %: 62 % (ref 43–77)

## 2011-01-29 LAB — CBC
HCT: 35.8 % — ABNORMAL LOW (ref 36.0–46.0)
Hemoglobin: 12.3 g/dL (ref 12.0–15.0)
MCH: 31.1 pg (ref 26.0–34.0)
MCHC: 34.4 g/dL (ref 30.0–36.0)
MCV: 90.4 fL (ref 78.0–100.0)
Platelets: 243 10*3/uL (ref 150–400)
RBC: 3.96 MIL/uL (ref 3.87–5.11)
RDW: 12.3 % (ref 11.5–15.5)
WBC: 7.9 10*3/uL (ref 4.0–10.5)

## 2011-01-29 NOTE — Telephone Encounter (Signed)
I spoke with pt and advised to go to ER for this, per Dr. Clent Ridges.

## 2011-02-08 ENCOUNTER — Ambulatory Visit
Admission: RE | Admit: 2011-02-08 | Discharge: 2011-02-08 | Disposition: A | Discharge status: Home / Self Care | Payer: Medicare Other | Source: Ambulatory Visit | Attending: Gynecology | Admitting: Gynecology

## 2011-02-08 DIAGNOSIS — Z1231 Encounter for screening mammogram for malignant neoplasm of breast: Secondary | ICD-10-CM

## 2011-03-29 ENCOUNTER — Encounter: Payer: Self-pay | Admitting: Internal Medicine

## 2011-03-29 ENCOUNTER — Ambulatory Visit (INDEPENDENT_AMBULATORY_CARE_PROVIDER_SITE_OTHER): Payer: Medicare Other | Admitting: Internal Medicine

## 2011-03-29 VITALS — BP 130/80 | HR 108 | Temp 98.2°F | Wt 142.0 lb

## 2011-03-29 DIAGNOSIS — J22 Unspecified acute lower respiratory infection: Secondary | ICD-10-CM

## 2011-03-29 DIAGNOSIS — G629 Polyneuropathy, unspecified: Secondary | ICD-10-CM

## 2011-03-29 DIAGNOSIS — R05 Cough: Secondary | ICD-10-CM

## 2011-03-29 DIAGNOSIS — R059 Cough, unspecified: Secondary | ICD-10-CM

## 2011-03-29 MED ORDER — AZITHROMYCIN 250 MG PO TABS: 250.0000 mg | ORAL_TABLET | ORAL | Status: AC

## 2011-03-29 NOTE — Patient Instructions (Signed)
This could be a vrial bronchitis  But since you are getting worse at 8 days can treat empirically for bacterial  Cause. Either way should be better in a week. Call or seek care  if  persistent or progressive or alarm symptoms as discussed. fevr  Shortness of breath etc.

## 2011-03-29 NOTE — Progress Notes (Signed)
  Subjective:    Patient ID: Susan Ford, female    DOB: January 12, 1930, 77 y.o.   MRN: [id number]  HPI  Patient comes in today for SDA  For acute problem evaluation. She comes with her husband today Onset 8 days ago with  Cough  Dry to lose and now Getting worse and doesn't feel well.  NO fever byt myalagiafatigue .Marland KitchenCongested.  No facepain NVD  husbnad had a severe rti forlonged    No hemoptysis .    No med for this now .  Takes pain meds for neuropathy if needed,  Under care Dr Sandria Manly at present.    Review of Systems No fever sweats VD new rash falling uti sx  Allergy sx  Rest as per  hpi  Has neuropathy  Under rx   No bleeding syncope sob   Past history family history social history reviewed in the electronic medical record. Flu shot    Objective:   Physical Exam WDWN in NAD  quiet respirations; mildly congested  somewhat hoarse. Non toxic . HEENT: Normocephalic ;atraumatic , Eyes;  PERRL, EOMs  Full, lids and conjunctiva clear,,Ears: no deformities, canals nl, TM landmarks normal, Nose: no deformity or discharge but congested;face minimally tender Mouth : OP clear without lesion or edema . Neck: Supple without adenopathy or masses or bruits Chest:  Clear to A&P without wheezes rales or rhonchi CV:  S1-S2 no gallops or murmurs peripheral perfusion is normal Skin :nl perfusion and no acute rashes         Assessment & Plan:  Prolonged resp infection cough with some risk ? Viral vs atypicals   May benefit from added rx   Options  discussed   Can add antibiotic for above but    Risk benefit of medication discussed.  sx suppoti ve rx in the meantime  Call or seek care  if  persistent or progressive or alarm symptoms as discussed.

## 2011-04-03 DIAGNOSIS — R05 Cough: Secondary | ICD-10-CM | POA: Insufficient documentation

## 2011-04-03 DIAGNOSIS — J22 Unspecified acute lower respiratory infection: Secondary | ICD-10-CM | POA: Insufficient documentation

## 2011-04-03 DIAGNOSIS — R059 Cough, unspecified: Secondary | ICD-10-CM | POA: Insufficient documentation

## 2011-04-03 DIAGNOSIS — G629 Polyneuropathy, unspecified: Secondary | ICD-10-CM | POA: Insufficient documentation

## 2011-05-03 DIAGNOSIS — G609 Hereditary and idiopathic neuropathy, unspecified: Secondary | ICD-10-CM | POA: Diagnosis not present

## 2011-05-04 ENCOUNTER — Telehealth: Payer: Self-pay | Admitting: Internal Medicine

## 2011-05-04 NOTE — Telephone Encounter (Signed)
Need info from dr Sandria Manly and reason dx code for reason and then needs to be done at an office visit .

## 2011-05-04 NOTE — Telephone Encounter (Signed)
Pt called back to check on status of getting an EKG schd. Pls advise.

## 2011-05-04 NOTE — Telephone Encounter (Signed)
Spoke to pt- she has a referral form that states neuropathy, Pt will bring in form. Pt is aware that she will have to wait.

## 2011-05-04 NOTE — Telephone Encounter (Signed)
Pt stated she saw Dr Sandria Manly yesterday doc is recommending pt to have EKG due to medications she is currently taken (neurotin) Dr Sandria Manly will switch pt to nortriptyline. Can I sch pt for EKG only?

## 2011-05-05 ENCOUNTER — Encounter: Payer: Self-pay | Admitting: Internal Medicine

## 2011-05-05 ENCOUNTER — Ambulatory Visit (INDEPENDENT_AMBULATORY_CARE_PROVIDER_SITE_OTHER): Payer: Medicare Other | Admitting: Internal Medicine

## 2011-05-05 VITALS — BP 130/80 | HR 78 | Wt 150.0 lb

## 2011-05-05 DIAGNOSIS — Z79899 Other long term (current) drug therapy: Secondary | ICD-10-CM | POA: Diagnosis not present

## 2011-05-05 DIAGNOSIS — G609 Hereditary and idiopathic neuropathy, unspecified: Secondary | ICD-10-CM

## 2011-05-05 DIAGNOSIS — T797XXA Traumatic subcutaneous emphysema, initial encounter: Secondary | ICD-10-CM | POA: Diagnosis not present

## 2011-05-05 DIAGNOSIS — G629 Polyneuropathy, unspecified: Secondary | ICD-10-CM | POA: Insufficient documentation

## 2011-05-05 DIAGNOSIS — Z136 Encounter for screening for cardiovascular disorders: Secondary | ICD-10-CM

## 2011-05-05 NOTE — Patient Instructions (Addendum)
We'll send a copy of the EKG to Dr. Sandria Manly.  I am not sure why you had the swelling in your face.  The crunchiness in your neck feels like  Air   Or subcutaneous emphysema Unsure why you have this. Call tomorrow and seek care immediately if gets worse or any shortness of breath.

## 2011-05-05 NOTE — Progress Notes (Signed)
  Subjective:    Patient ID: Susan Ford, female    DOB: 07/21/1929, 78 y.o.   MRN: [id number]  HPI Patient comes in today with husband because she requires an EKG to be done for her neurologist Dr. love before he puts her on nortriptyline for her peripheral neuropathy. She is to be weaning on her Neurontin and change over period. She denies any chest pain shortness of breath or significant palpitations.  Upon questioning about health update she states that she did have a dental procedure done on her upper molars a crown days ago and was in the dentist chair for 3-4 hours. At that time her face swelled up without itching since that time has gone down but does have some neck soreness and throat soreness. There was no bleeding fever or unusual procedure otherwise. She had received local anesthesia.   Review of Systems No chest pain shortness of breath nausea vomiting coughing she does have slight hoarseness no fever no unusual rash no peripheral neuropathy as stated  Past history family history social history reviewed in the electronic medical record.     Objective:   Physical Exam WDWN in and Heent  [place] eys clear ears nl inspection. Op clear and no edema She has a faint look of edema on her lower face but no specific redness or rash Tongue is midline Neck is mildly tender a.c. area no nodes are noted there is a crunchy crepitus that goes along the sternocleidomastoid and jaw line down into the supraclavicular supra-sternal area. There is no significant edema redness or pain. Chest:  Clear to A&P without wheezes rales or rhonchi CV:  S1-S2 no gallops or murmurs peripheral perfusion is normal Abdomen:  Sof,t normal bowel sounds without hepatosplenomegaly, no guarding rebound or masses no CVA tenderness No clubbing cyanosis or edema EKG NSR intervals nor but R SR in v1 qrs is .9    Examined also with Dr Clent Ridges    Assessment & Plan:  High-risk medication peripheral neuropathy. EKG done  no alarm findings on this  Incidental finding probable subcutaneous emphysema in the jaw and neck area without associated pulmonary symptoms or cardiac symptoms.  Apparent ocurrence after a dental procedure. With local anesthesia.  No history of increased thoracic pressure events Cannot explain why she has these findings but she looks very clinically stable upon deep questioning this is improving but still persistent. Consideration of imaging however I'm not sure what this will tell us at this time. We'll follow her closely phone contact tomorrow if her swelling gets worse or any kind of cardiovascular or  pulmonary symptoms she should contact us or other care emergently. This information was shared with patient and her husband.

## 2011-05-06 ENCOUNTER — Telehealth: Payer: Self-pay | Admitting: *Deleted

## 2011-05-06 DIAGNOSIS — Z961 Presence of intraocular lens: Secondary | ICD-10-CM | POA: Diagnosis not present

## 2011-05-06 NOTE — Telephone Encounter (Signed)
Calling pt to see how she was doing. Left message for pt to call back.

## 2011-05-07 DIAGNOSIS — M25579 Pain in unspecified ankle and joints of unspecified foot: Secondary | ICD-10-CM | POA: Diagnosis not present

## 2011-05-07 NOTE — Telephone Encounter (Signed)
Pt called and her glands are fine.

## 2011-05-16 NOTE — Telephone Encounter (Signed)
i am assuming  You mean that the crunchiness in the neck and tenderness is better . Please have her follow up if  This is not the case

## 2011-05-17 NOTE — Telephone Encounter (Signed)
Called pt and pt states that the crunchiness in her neck and tenderness have improved a lot.  Pt states the crunchiness improved the following day after her appt.  Pt aware to follow up if needed.

## 2011-05-31 DIAGNOSIS — M25579 Pain in unspecified ankle and joints of unspecified foot: Secondary | ICD-10-CM | POA: Diagnosis not present

## 2011-06-07 DIAGNOSIS — M47817 Spondylosis without myelopathy or radiculopathy, lumbosacral region: Secondary | ICD-10-CM | POA: Diagnosis not present

## 2011-06-07 DIAGNOSIS — IMO0002 Reserved for concepts with insufficient information to code with codable children: Secondary | ICD-10-CM | POA: Diagnosis not present

## 2011-06-28 DIAGNOSIS — M25579 Pain in unspecified ankle and joints of unspecified foot: Secondary | ICD-10-CM | POA: Diagnosis not present

## 2011-06-28 DIAGNOSIS — M48061 Spinal stenosis, lumbar region without neurogenic claudication: Secondary | ICD-10-CM | POA: Diagnosis not present

## 2011-06-29 DIAGNOSIS — R7989 Other specified abnormal findings of blood chemistry: Secondary | ICD-10-CM | POA: Diagnosis not present

## 2011-07-26 DIAGNOSIS — M79609 Pain in unspecified limb: Secondary | ICD-10-CM | POA: Diagnosis not present

## 2011-07-26 DIAGNOSIS — M25579 Pain in unspecified ankle and joints of unspecified foot: Secondary | ICD-10-CM | POA: Diagnosis not present

## 2011-08-02 DIAGNOSIS — G609 Hereditary and idiopathic neuropathy, unspecified: Secondary | ICD-10-CM | POA: Diagnosis not present

## 2011-09-13 DIAGNOSIS — G544 Lumbosacral root disorders, not elsewhere classified: Secondary | ICD-10-CM | POA: Diagnosis not present

## 2011-09-13 DIAGNOSIS — G63 Polyneuropathy in diseases classified elsewhere: Secondary | ICD-10-CM | POA: Diagnosis not present

## 2011-09-23 DIAGNOSIS — M48061 Spinal stenosis, lumbar region without neurogenic claudication: Secondary | ICD-10-CM | POA: Diagnosis not present

## 2011-10-05 DIAGNOSIS — M47817 Spondylosis without myelopathy or radiculopathy, lumbosacral region: Secondary | ICD-10-CM | POA: Diagnosis not present

## 2011-10-05 DIAGNOSIS — M48061 Spinal stenosis, lumbar region without neurogenic claudication: Secondary | ICD-10-CM | POA: Diagnosis not present

## 2011-10-13 DIAGNOSIS — M48061 Spinal stenosis, lumbar region without neurogenic claudication: Secondary | ICD-10-CM | POA: Diagnosis not present

## 2011-11-08 ENCOUNTER — Emergency Department (HOSPITAL_COMMUNITY)
Admission: EM | Admit: 2011-11-08 | Discharge: 2011-11-08 | Disposition: A | Discharge status: Home / Self Care | Payer: Medicare Other | Attending: Emergency Medicine | Admitting: Emergency Medicine

## 2011-11-08 ENCOUNTER — Encounter (HOSPITAL_COMMUNITY): Payer: Self-pay | Admitting: Family Medicine

## 2011-11-08 DIAGNOSIS — K219 Gastro-esophageal reflux disease without esophagitis: Secondary | ICD-10-CM | POA: Insufficient documentation

## 2011-11-08 DIAGNOSIS — IMO0002 Reserved for concepts with insufficient information to code with codable children: Secondary | ICD-10-CM | POA: Diagnosis not present

## 2011-11-08 DIAGNOSIS — Z23 Encounter for immunization: Secondary | ICD-10-CM

## 2011-11-08 DIAGNOSIS — Z7982 Long term (current) use of aspirin: Secondary | ICD-10-CM | POA: Diagnosis not present

## 2011-11-08 DIAGNOSIS — Z79899 Other long term (current) drug therapy: Secondary | ICD-10-CM | POA: Insufficient documentation

## 2011-11-08 DIAGNOSIS — S80819A Abrasion, unspecified lower leg, initial encounter: Secondary | ICD-10-CM

## 2011-11-08 MED ORDER — TETANUS-DIPHTH-ACELL PERTUSSIS 5-2.5-18.5 LF-MCG/0.5 IM SUSP
0.5000 mL | Freq: Once | INTRAMUSCULAR | Status: AC
  Administered 2011-11-08: 0.5 mL via INTRAMUSCULAR
  Filled 2011-11-08: qty 0.5

## 2011-11-08 NOTE — ED Provider Notes (Signed)
Medical screening examination/treatment/procedure(s) were performed by non-physician practitioner and as supervising physician I was immediately available for consultation/collaboration.  Flint Melter, MD 11/08/11 [phone]

## 2011-11-08 NOTE — ED Provider Notes (Signed)
History     CSN: [id number]  Arrival date & time 11/08/11  1616   First MD Initiated Contact with Patient 11/08/11 1626      Chief Complaint  Patient presents with  . Extremity Laceration    (Consider location/radiation/quality/duration/timing/severity/associated sxs/prior treatment) HPI Comments: Patient comes in today requesting a tetanus shot.  Last tetanus was 14 years ago.  She reports that just prior to arrival she scraped her right lower leg on a wire fence.  She now has a 3cm abrasion to the right lower leg.  Area not actively bleeding.  She reports that she cleaned the area with soap and water and applied Neosporin prior to arrival.  She denies any numbness or tingling.  She denies pain.    The history is provided by the patient.    Past Medical History  Diagnosis Date  . GERD (gastroesophageal reflux disease)     Had endoscopy no history of Barrett's  . Arthritis     Past Surgical History  Procedure Date  . Abdominal hysterectomy     Nine reasons    Family History  Problem Relation Age of Onset  . Neurodegenerative disease Mother     Franco Nones Drager died at 92  . Alcohol abuse    . Heart disease      History  Substance Use Topics  . Smoking status: Former Games developer  . Smokeless tobacco: Not on file  . Alcohol Use: Yes    OB History    Grav Para Term Preterm Abortions TAB SAB Ect Mult Living                  Review of Systems  Constitutional: Negative for fever and chills.  Gastrointestinal: Negative for nausea and vomiting.  Musculoskeletal: Negative for gait problem.  Skin: Positive for wound.  Neurological: Negative for numbness.    Allergies  Oxytetracycline  Home Medications   Current Outpatient Rx  Name Route Sig Dispense Refill  . VITAMIN C 500 MG PO TABS Oral Take 500 mg by mouth daily.      . ASPIRIN 81 MG PO CHEW Oral Chew 81 mg by mouth daily.      Marland Kitchen CALCIUM CITRATE-VITAMIN D 315-200 MG-UNIT PO TABS Oral Take 1 tablet by mouth daily.       . CELECOXIB 200 MG PO CAPS Oral Take 200 mg by mouth daily.      Marland Kitchen ESTRADIOL 0.0375 MG/24HR TD PTTW Transdermal Place 1 patch onto the skin 2 (two) times a week.      Marland Kitchen FAMOTIDINE 40 MG PO TABS Oral Take 40 mg by mouth every morning.     Marland Kitchen OMEGA-3 FATTY ACIDS 1000 MG PO CAPS Oral Take 1 g by mouth daily.     Marland Kitchen GABAPENTIN 600 MG PO TABS Oral Take 900 mg by mouth 4 (four) times daily. Take 1 and 1/2 tablets by mouth 4 times daily.    Marland Kitchen ONE-DAILY MULTI VITAMINS PO TABS Oral Take 1 tablet by mouth daily.      Marland Kitchen SIMVASTATIN 40 MG PO TABS Oral Take 40 mg by mouth at bedtime.        BP 121/76  Pulse 108  Temp 98.3 F (36.8 C) (Oral)  Resp 18  SpO2 95%  Physical Exam  Nursing note and vitals reviewed. Constitutional: She appears well-developed and well-nourished. No distress.  HENT:  Head: Normocephalic and atraumatic.  Cardiovascular: Normal rate, regular rhythm and normal heart sounds.   Pulses:  Dorsalis pedis pulses are 2+ on the right side, and 2+ on the left side.  Pulmonary/Chest: Effort normal and breath sounds normal. No respiratory distress. She has no wheezes.  Musculoskeletal: Normal range of motion. She exhibits no edema and no tenderness.  Neurological: She is alert. No sensory deficit. Gait normal.  Skin: Skin is warm and dry. She is not diaphoretic. No erythema.     Psychiatric: She has a normal mood and affect.    ED Course  Procedures (including critical care time)  Labs Reviewed - No data to display No results found.   1. Abrasion of leg   2. Need for tetanus booster       MDM  Patient comes in today requesting a tetanus shot.  Last tetanus 14 years ago.  She has a small abrasion to her leg that did not require sutures or dermabond.  Neurovascularly intact.        Pascal Lux Christine, PA-C 11/08/11 1733

## 2011-11-08 NOTE — ED Notes (Signed)
Pt reports she was working in the yard today placing a wire fence around some flowers and scraped her right lower leg. Reports hasn't had a tetanus shot in over 14 years. NAD noted at this time.

## 2011-11-09 DIAGNOSIS — M79609 Pain in unspecified limb: Secondary | ICD-10-CM | POA: Diagnosis not present

## 2011-11-16 DIAGNOSIS — S82409A Unspecified fracture of shaft of unspecified fibula, initial encounter for closed fracture: Secondary | ICD-10-CM | POA: Diagnosis not present

## 2011-11-19 DIAGNOSIS — M79609 Pain in unspecified limb: Secondary | ICD-10-CM | POA: Diagnosis not present

## 2011-11-19 DIAGNOSIS — M25569 Pain in unspecified knee: Secondary | ICD-10-CM | POA: Diagnosis not present

## 2011-12-02 DIAGNOSIS — IMO0002 Reserved for concepts with insufficient information to code with codable children: Secondary | ICD-10-CM | POA: Diagnosis not present

## 2011-12-02 DIAGNOSIS — S8410XA Injury of peroneal nerve at lower leg level, unspecified leg, initial encounter: Secondary | ICD-10-CM | POA: Diagnosis not present

## 2011-12-02 DIAGNOSIS — M4800 Spinal stenosis, site unspecified: Secondary | ICD-10-CM | POA: Diagnosis not present

## 2011-12-02 DIAGNOSIS — G609 Hereditary and idiopathic neuropathy, unspecified: Secondary | ICD-10-CM | POA: Diagnosis not present

## 2011-12-24 DIAGNOSIS — G63 Polyneuropathy in diseases classified elsewhere: Secondary | ICD-10-CM | POA: Diagnosis not present

## 2011-12-24 DIAGNOSIS — G544 Lumbosacral root disorders, not elsewhere classified: Secondary | ICD-10-CM | POA: Diagnosis not present

## 2012-01-04 DIAGNOSIS — M25579 Pain in unspecified ankle and joints of unspecified foot: Secondary | ICD-10-CM | POA: Diagnosis not present

## 2012-01-06 DIAGNOSIS — M48061 Spinal stenosis, lumbar region without neurogenic claudication: Secondary | ICD-10-CM | POA: Diagnosis not present

## 2012-01-07 ENCOUNTER — Other Ambulatory Visit: Payer: Self-pay | Admitting: Neurological Surgery

## 2012-01-07 DIAGNOSIS — M48061 Spinal stenosis, lumbar region without neurogenic claudication: Secondary | ICD-10-CM

## 2012-01-10 ENCOUNTER — Other Ambulatory Visit: Payer: Self-pay | Admitting: Gynecology

## 2012-01-10 DIAGNOSIS — Z1231 Encounter for screening mammogram for malignant neoplasm of breast: Secondary | ICD-10-CM

## 2012-01-14 ENCOUNTER — Ambulatory Visit
Admission: RE | Admit: 2012-01-14 | Discharge: 2012-01-14 | Disposition: A | Discharge status: Home / Self Care | Payer: Medicare Other | Source: Ambulatory Visit | Attending: Neurological Surgery | Admitting: Neurological Surgery

## 2012-01-14 DIAGNOSIS — M48061 Spinal stenosis, lumbar region without neurogenic claudication: Secondary | ICD-10-CM | POA: Diagnosis not present

## 2012-01-18 DIAGNOSIS — Z23 Encounter for immunization: Secondary | ICD-10-CM | POA: Diagnosis not present

## 2012-01-18 DIAGNOSIS — M25579 Pain in unspecified ankle and joints of unspecified foot: Secondary | ICD-10-CM | POA: Diagnosis not present

## 2012-01-18 DIAGNOSIS — M48061 Spinal stenosis, lumbar region without neurogenic claudication: Secondary | ICD-10-CM | POA: Diagnosis not present

## 2012-01-19 DIAGNOSIS — IMO0002 Reserved for concepts with insufficient information to code with codable children: Secondary | ICD-10-CM | POA: Diagnosis not present

## 2012-01-19 DIAGNOSIS — M47817 Spondylosis without myelopathy or radiculopathy, lumbosacral region: Secondary | ICD-10-CM | POA: Diagnosis not present

## 2012-01-21 ENCOUNTER — Other Ambulatory Visit: Payer: Self-pay | Admitting: Neurological Surgery

## 2012-01-25 NOTE — Pre-Procedure Instructions (Signed)
20 Susan Ford  01/25/2012   Your procedure is scheduled on:  Tuesday, October 8th.  Report to [facility] at 7:15 AM.  Call this number if you have problems the morning of surgery: [phone]   Remember:   Do not eat food or drink any liquid:After Midnight.     Take these medicines the morning of surgery with A SIP OF WATER: Famotidine (Pepcid), Gabapentin (Neurontin).   Do not take any Aspirin, Coumadin , Plavgix, Effient, NSAIDs or Herbal medications.     Do not wear jewelry, make-up or nail polish.  Do not wear lotions, powders, or perfumes. You may wear deodorant.  Do not shave 48 hours prior to surgery. Men may shave face and neck.  Do not bring valuables to the hospital.  Contacts, dentures or bridgework may not be worn into surgery.  Leave suitcase in the car. After surgery it may be brought to your room.  For patients admitted to the hospital, checkout time is 11:00 AM the day of discharge.   Patients discharged the day of surgery will not be allowed to drive home.  Name and phone number of your driver: NA  Special Instructions: Shower using CHG 2 nights before surgery and the night before surgery.  If you shower the day of surgery use CHG.  Use special wash - you have one bottle of CHG for all showers.  You should use approximately 1/3 of the bottle for each shower.   Please read over the following fact sheets that you were given: Pain Booklet, Coughing and Deep Breathing and Surgical Site Infection Prevention

## 2012-01-26 ENCOUNTER — Encounter (HOSPITAL_COMMUNITY): Payer: Self-pay | Admitting: Vascular Surgery

## 2012-01-26 ENCOUNTER — Encounter (HOSPITAL_COMMUNITY)
Admission: RE | Admit: 2012-01-26 | Discharge: 2012-01-26 | Disposition: A | Discharge status: Home / Self Care | Payer: Medicare Other | Source: Ambulatory Visit | Attending: Neurological Surgery | Admitting: Neurological Surgery

## 2012-01-26 ENCOUNTER — Encounter (HOSPITAL_COMMUNITY): Payer: Self-pay

## 2012-01-26 DIAGNOSIS — Z01811 Encounter for preprocedural respiratory examination: Secondary | ICD-10-CM | POA: Diagnosis not present

## 2012-01-26 HISTORY — DX: Unspecified cataract: H26.9

## 2012-01-26 LAB — BASIC METABOLIC PANEL
BUN: 12 mg/dL (ref 6–23)
CO2: 25 mEq/L (ref 19–32)
Calcium: 9.4 mg/dL (ref 8.4–10.5)
Chloride: 98 mEq/L (ref 96–112)
Creatinine, Ser: 0.64 mg/dL (ref 0.50–1.10)
GFR calc Af Amer: >90 mL/min (ref >90)
GFR calc non Af Amer: 82 mL/min — ABNORMAL LOW (ref >90)
Glucose, Bld: 105 mg/dL — ABNORMAL HIGH (ref 70–99)
Potassium: 4.4 mEq/L (ref 3.5–5.1)
Sodium: 134 mEq/L — ABNORMAL LOW (ref 135–145)

## 2012-01-26 LAB — CBC
HCT: 39.4 % (ref 36.0–46.0)
Hemoglobin: 13.5 g/dL (ref 12.0–15.0)
MCH: 31.8 pg (ref 26.0–34.0)
MCHC: 34.3 g/dL (ref 30.0–36.0)
MCV: 92.7 fL (ref 78.0–100.0)
Platelets: 235 10*3/uL (ref 150–400)
RBC: 4.25 MIL/uL (ref 3.87–5.11)
RDW: 12.3 % (ref 11.5–15.5)
WBC: 7.3 10*3/uL (ref 4.0–10.5)

## 2012-01-26 LAB — SURGICAL PCR SCREEN
MRSA, PCR: NEGATIVE
Staphylococcus aureus: NEGATIVE

## 2012-01-26 MED ORDER — CEFAZOLIN SODIUM-DEXTROSE 2-3 GM-% IV SOLR: 2.0000 g | INTRAVENOUS | Status: DC

## 2012-01-26 NOTE — Consult Note (Signed)
Anesthesia Chart Review:  Patient is a 78 year old female scheduled for left L4-5, L5-S1 laminectomy, foraminotomy, microscope on 02/01/12.  History includes former smoker, GERD, arthritis, peripheral neuropathy (Dr. Sandria Manly), hysterectomy, tonsillectomy.  She is a retired Engineer, civil (consulting).  PCP is Dr. Berniece Andreas.  She had facial swelling following a dental procedure done under local anesthesia.  She saw her PCP who thought it was probably due to subcutaneous emphysema and did not require specific treatment.      She requests that Dr. Michelle Piper be her Anesthesiologist as he was recommended to her by two different individuals.  I have notified OR scheduling and will notify Dr. Michelle Piper.  EKG on 05/05/11 showed NSR, RSR in V1.   CXR on 01/26/12 showed no active disease.  Labs acceptable.  Of note, she has requested that her husband be allowed to stay overnight with her during her hospitalization.  He uses [facility].  Per Shanda Bumps at Dr. Verlee Rossetti office, she will likely go to [facility].  I have notified [facility].    Shonna Chock, PA-C

## 2012-02-01 ENCOUNTER — Inpatient Hospital Stay (HOSPITAL_COMMUNITY)
Admission: RE | Admit: 2012-02-01 | Discharge: 2012-02-02 | DRG: 491 | Disposition: A | Discharge status: Home / Self Care | Payer: Medicare Other | Source: Ambulatory Visit | Attending: Neurological Surgery | Admitting: Neurological Surgery

## 2012-02-01 ENCOUNTER — Encounter (HOSPITAL_COMMUNITY): Payer: Self-pay | Admitting: Vascular Surgery

## 2012-02-01 ENCOUNTER — Ambulatory Visit (HOSPITAL_COMMUNITY): Payer: Medicare Other | Admitting: Vascular Surgery

## 2012-02-01 ENCOUNTER — Encounter (HOSPITAL_COMMUNITY): Payer: Self-pay | Admitting: *Deleted

## 2012-02-01 ENCOUNTER — Encounter (HOSPITAL_COMMUNITY)
Admission: RE | Disposition: A | Discharge status: Home / Self Care | Payer: Self-pay | Source: Ambulatory Visit | Attending: Neurological Surgery

## 2012-02-01 ENCOUNTER — Ambulatory Visit (HOSPITAL_COMMUNITY): Payer: Medicare Other

## 2012-02-01 DIAGNOSIS — M5137 Other intervertebral disc degeneration, lumbosacral region: Secondary | ICD-10-CM | POA: Diagnosis not present

## 2012-02-01 DIAGNOSIS — K219 Gastro-esophageal reflux disease without esophagitis: Secondary | ICD-10-CM | POA: Diagnosis not present

## 2012-02-01 DIAGNOSIS — M5126 Other intervertebral disc displacement, lumbar region: Principal | ICD-10-CM | POA: Diagnosis present

## 2012-02-01 DIAGNOSIS — Z888 Allergy status to other drugs, medicaments and biological substances status: Secondary | ICD-10-CM | POA: Diagnosis not present

## 2012-02-01 DIAGNOSIS — Z79899 Other long term (current) drug therapy: Secondary | ICD-10-CM

## 2012-02-01 DIAGNOSIS — M47817 Spondylosis without myelopathy or radiculopathy, lumbosacral region: Secondary | ICD-10-CM | POA: Diagnosis not present

## 2012-02-01 DIAGNOSIS — G709 Myoneural disorder, unspecified: Secondary | ICD-10-CM | POA: Diagnosis not present

## 2012-02-01 DIAGNOSIS — M129 Arthropathy, unspecified: Secondary | ICD-10-CM | POA: Diagnosis present

## 2012-02-01 DIAGNOSIS — Z90710 Acquired absence of both cervix and uterus: Secondary | ICD-10-CM | POA: Diagnosis not present

## 2012-02-01 DIAGNOSIS — Z7982 Long term (current) use of aspirin: Secondary | ICD-10-CM | POA: Diagnosis not present

## 2012-02-01 DIAGNOSIS — H269 Unspecified cataract: Secondary | ICD-10-CM | POA: Diagnosis present

## 2012-02-01 DIAGNOSIS — Z9089 Acquired absence of other organs: Secondary | ICD-10-CM | POA: Diagnosis not present

## 2012-02-01 DIAGNOSIS — Z87891 Personal history of nicotine dependence: Secondary | ICD-10-CM

## 2012-02-01 DIAGNOSIS — M48061 Spinal stenosis, lumbar region without neurogenic claudication: Secondary | ICD-10-CM

## 2012-02-01 DIAGNOSIS — IMO0002 Reserved for concepts with insufficient information to code with codable children: Secondary | ICD-10-CM | POA: Diagnosis not present

## 2012-02-01 HISTORY — PX: LUMBAR LAMINECTOMY/DECOMPRESSION MICRODISCECTOMY: SHX5026

## 2012-02-01 SURGERY — LUMBAR LAMINECTOMY/DECOMPRESSION MICRODISCECTOMY 2 LEVELS
Anesthesia: General | Site: Back | Laterality: Left | Wound class: Clean

## 2012-02-01 MED ORDER — MIDAZOLAM HCL 5 MG/5ML IJ SOLN
INTRAMUSCULAR | Status: DC | PRN
  Administered 2012-02-01 (×2): 1 mg via INTRAVENOUS

## 2012-02-01 MED ORDER — ONDANSETRON HCL 4 MG/2ML IJ SOLN: 4.0000 mg | INTRAMUSCULAR | Status: DC | PRN

## 2012-02-01 MED ORDER — THROMBIN 5000 UNITS EX SOLR
CUTANEOUS | Status: DC | PRN
  Administered 2012-02-01 (×2): 5000 [IU] via TOPICAL

## 2012-02-01 MED ORDER — MEPERIDINE HCL 25 MG/ML IJ SOLN: 6.2500 mg | INTRAMUSCULAR | Status: DC | PRN

## 2012-02-01 MED ORDER — HYDROCODONE-ACETAMINOPHEN 5-325 MG PO TABS
1.0000 | ORAL_TABLET | ORAL | Status: DC | PRN
  Administered 2012-02-01 – 2012-02-02 (×2): 2 via ORAL
  Filled 2012-02-01 (×2): qty 2

## 2012-02-01 MED ORDER — KETOROLAC TROMETHAMINE 15 MG/ML IJ SOLN
15.0000 mg | Freq: Three times a day (TID) | INTRAMUSCULAR | Status: DC
  Administered 2012-02-01 – 2012-02-02 (×3): 15 mg via INTRAVENOUS
  Filled 2012-02-01 (×4): qty 1

## 2012-02-01 MED ORDER — DEXTROSE 5 % IV SOLN
INTRAVENOUS | Status: DC | PRN
  Administered 2012-02-01: 10:00:00 via INTRAVENOUS

## 2012-02-01 MED ORDER — HYDROMORPHONE HCL PF 1 MG/ML IJ SOLN: 0.2500 mg | INTRAMUSCULAR | Status: DC | PRN

## 2012-02-01 MED ORDER — PHENOL 1.4 % MT LIQD: 1.0000 | OROMUCOSAL | Status: DC | PRN

## 2012-02-01 MED ORDER — LIDOCAINE-EPINEPHRINE 1 %-1:100000 IJ SOLN
INTRAMUSCULAR | Status: DC | PRN
  Administered 2012-02-01: 5 mL via INTRADERMAL

## 2012-02-01 MED ORDER — SODIUM CHLORIDE 0.9 % IJ SOLN
3.0000 mL | Freq: Two times a day (BID) | INTRAMUSCULAR | Status: DC
  Administered 2012-02-01: 3 mL via INTRAVENOUS

## 2012-02-01 MED ORDER — NEOSTIGMINE METHYLSULFATE 1 MG/ML IJ SOLN
INTRAMUSCULAR | Status: DC | PRN
  Administered 2012-02-01: 4 mg via INTRAVENOUS

## 2012-02-01 MED ORDER — ALUM & MAG HYDROXIDE-SIMETH 200-200-20 MG/5ML PO SUSP: 30.0000 mL | Freq: Four times a day (QID) | ORAL | Status: DC | PRN

## 2012-02-01 MED ORDER — DOCUSATE SODIUM 100 MG PO CAPS
100.0000 mg | ORAL_CAPSULE | Freq: Two times a day (BID) | ORAL | Status: DC
  Administered 2012-02-01 (×2): 100 mg via ORAL
  Filled 2012-02-01 (×3): qty 1

## 2012-02-01 MED ORDER — ONDANSETRON HCL 4 MG/2ML IJ SOLN
INTRAMUSCULAR | Status: DC | PRN
  Administered 2012-02-01: 4 mg via INTRAVENOUS

## 2012-02-01 MED ORDER — OXYCODONE HCL 5 MG PO TABS: 5.0000 mg | ORAL_TABLET | Freq: Once | ORAL | Status: DC | PRN

## 2012-02-01 MED ORDER — LIDOCAINE HCL (CARDIAC) 20 MG/ML IV SOLN
INTRAVENOUS | Status: DC | PRN
  Administered 2012-02-01: 100 mg via INTRAVENOUS

## 2012-02-01 MED ORDER — ACETAMINOPHEN 325 MG PO TABS: 650.0000 mg | ORAL_TABLET | ORAL | Status: DC | PRN

## 2012-02-01 MED ORDER — LACTATED RINGERS IV SOLN
INTRAVENOUS | Status: DC | PRN
  Administered 2012-02-01 (×2): via INTRAVENOUS

## 2012-02-01 MED ORDER — BACITRACIN 50000 UNITS IM SOLR
INTRAMUSCULAR | Status: AC
  Filled 2012-02-01: qty 1

## 2012-02-01 MED ORDER — 0.9 % SODIUM CHLORIDE (POUR BTL) OPTIME
TOPICAL | Status: DC | PRN
  Administered 2012-02-01: 1000 mL

## 2012-02-01 MED ORDER — DIAZEPAM 5 MG PO TABS: 5.0000 mg | ORAL_TABLET | Freq: Four times a day (QID) | ORAL | Status: DC | PRN

## 2012-02-01 MED ORDER — MORPHINE SULFATE 2 MG/ML IJ SOLN
1.0000 mg | INTRAMUSCULAR | Status: DC | PRN
  Administered 2012-02-01: 2 mg via INTRAVENOUS
  Filled 2012-02-01: qty 1

## 2012-02-01 MED ORDER — GABAPENTIN 600 MG PO TABS
600.0000 mg | ORAL_TABLET | Freq: Four times a day (QID) | ORAL | Status: DC
  Administered 2012-02-01: 600 mg via ORAL
  Filled 2012-02-01 (×7): qty 1

## 2012-02-01 MED ORDER — GLYCOPYRROLATE 0.2 MG/ML IJ SOLN
INTRAMUSCULAR | Status: DC | PRN
Start: 2012-02-01 — End: 2012-02-01
  Administered 2012-02-01: 0.6 mg via INTRAVENOUS

## 2012-02-01 MED ORDER — SODIUM CHLORIDE 0.9 % IV SOLN: 250.0000 mL | INTRAVENOUS | Status: DC

## 2012-02-01 MED ORDER — SIMVASTATIN 40 MG PO TABS
40.0000 mg | ORAL_TABLET | Freq: Every day | ORAL | Status: DC
  Filled 2012-02-01 (×2): qty 1

## 2012-02-01 MED ORDER — ACETAMINOPHEN 650 MG RE SUPP: 650.0000 mg | RECTAL | Status: DC | PRN

## 2012-02-01 MED ORDER — MENTHOL 3 MG MT LOZG
1.0000 | LOZENGE | OROMUCOSAL | Status: DC | PRN
  Filled 2012-02-01: qty 9

## 2012-02-01 MED ORDER — CEFAZOLIN SODIUM-DEXTROSE 2-3 GM-% IV SOLR
INTRAVENOUS | Status: AC
  Administered 2012-02-01: 2 g via INTRAVENOUS
  Filled 2012-02-01: qty 50

## 2012-02-01 MED ORDER — FENTANYL CITRATE 0.05 MG/ML IJ SOLN
INTRAMUSCULAR | Status: DC | PRN
  Administered 2012-02-01 (×2): 50 ug via INTRAVENOUS
  Administered 2012-02-01: 100 ug via INTRAVENOUS

## 2012-02-01 MED ORDER — SODIUM CHLORIDE 0.9 % IR SOLN
Status: DC | PRN
  Administered 2012-02-01: 11:00:00

## 2012-02-01 MED ORDER — BUPIVACAINE HCL (PF) 0.5 % IJ SOLN
INTRAMUSCULAR | Status: DC | PRN
  Administered 2012-02-01: 20 mL
  Administered 2012-02-01: 5 mL

## 2012-02-01 MED ORDER — SODIUM CHLORIDE 0.9 % IJ SOLN: 3.0000 mL | INTRAMUSCULAR | Status: DC | PRN

## 2012-02-01 MED ORDER — HEMOSTATIC AGENTS (NO CHARGE) OPTIME
TOPICAL | Status: DC | PRN
  Administered 2012-02-01: 1 via TOPICAL

## 2012-02-01 MED ORDER — OXYCODONE HCL 5 MG/5ML PO SOLN: 5.0000 mg | Freq: Once | ORAL | Status: DC | PRN

## 2012-02-01 MED ORDER — MAGNESIUM HYDROXIDE 400 MG/5ML PO SUSP: 30.0000 mL | Freq: Every day | ORAL | Status: DC | PRN

## 2012-02-01 MED ORDER — ONDANSETRON HCL 4 MG/2ML IJ SOLN: 4.0000 mg | Freq: Once | INTRAMUSCULAR | Status: DC | PRN

## 2012-02-01 MED ORDER — SODIUM CHLORIDE 0.9 % IV SOLN
INTRAVENOUS | Status: AC
  Filled 2012-02-01: qty 500

## 2012-02-01 MED ORDER — ROCURONIUM BROMIDE 100 MG/10ML IV SOLN
INTRAVENOUS | Status: DC | PRN
  Administered 2012-02-01: 50 mg via INTRAVENOUS

## 2012-02-01 MED ORDER — PROPOFOL 10 MG/ML IV BOLUS
INTRAVENOUS | Status: DC | PRN
  Administered 2012-02-01: 130 mg via INTRAVENOUS

## 2012-02-01 MED ORDER — SENNA 8.6 MG PO TABS
1.0000 | ORAL_TABLET | Freq: Two times a day (BID) | ORAL | Status: DC
  Administered 2012-02-01 (×2): 8.6 mg via ORAL
  Filled 2012-02-01 (×3): qty 1

## 2012-02-01 MED ORDER — FAMOTIDINE 40 MG PO TABS
40.0000 mg | ORAL_TABLET | Freq: Every morning | ORAL | Status: DC
  Administered 2012-02-01: 40 mg via ORAL
  Filled 2012-02-01 (×2): qty 1

## 2012-02-01 SURGICAL SUPPLY — 51 items
ADH SKN CLS APL DERMABOND .7 (GAUZE/BANDAGES/DRESSINGS) ×1
BAG DECANTER FOR FLEXI CONT (MISCELLANEOUS) ×2 IMPLANT
BLADE SURG ROTATE 9660 (MISCELLANEOUS) IMPLANT
BUR ACORN 6.0 (BURR) ×1 IMPLANT
BUR MATCHSTICK NEURO 3.0 LAGG (BURR) ×2 IMPLANT
CANISTER SUCTION 2500CC (MISCELLANEOUS) ×2 IMPLANT
CLOTH BEACON ORANGE TIMEOUT ST (SAFETY) ×1 IMPLANT
CONT SPEC 4OZ CLIKSEAL STRL BL (MISCELLANEOUS) ×2 IMPLANT
DECANTER SPIKE VIAL GLASS SM (MISCELLANEOUS) ×2 IMPLANT
DERMABOND ADVANCED (GAUZE/BANDAGES/DRESSINGS) ×1
DERMABOND ADVANCED .7 DNX12 (GAUZE/BANDAGES/DRESSINGS) ×1 IMPLANT
DRAPE LAPAROTOMY 100X72X124 (DRAPES) ×2 IMPLANT
DRAPE MICROSCOPE LEICA (MISCELLANEOUS) ×1 IMPLANT
DRAPE POUCH INSTRU U-SHP 10X18 (DRAPES) ×2 IMPLANT
DRAPE PROXIMA HALF (DRAPES) IMPLANT
DURAPREP 26ML APPLICATOR (WOUND CARE) ×2 IMPLANT
ELECT REM PT RETURN 9FT ADLT (ELECTROSURGICAL) ×2
ELECTRODE REM PT RTRN 9FT ADLT (ELECTROSURGICAL) ×1 IMPLANT
GAUZE SPONGE 4X4 16PLY XRAY LF (GAUZE/BANDAGES/DRESSINGS) IMPLANT
GLOVE BIOGEL M 8.0 STRL (GLOVE) ×1 IMPLANT
GLOVE BIOGEL PI IND STRL 8.5 (GLOVE) ×1 IMPLANT
GLOVE BIOGEL PI INDICATOR 8.5 (GLOVE) ×2
GLOVE ECLIPSE 8.5 STRL (GLOVE) ×2 IMPLANT
GLOVE EXAM NITRILE LRG STRL (GLOVE) IMPLANT
GLOVE EXAM NITRILE MD LF STRL (GLOVE) IMPLANT
GLOVE EXAM NITRILE XL STR (GLOVE) IMPLANT
GLOVE EXAM NITRILE XS STR PU (GLOVE) IMPLANT
GLOVE SURG SS PI 8.0 STRL IVOR (GLOVE) ×2 IMPLANT
GOWN BRE IMP SLV AUR LG STRL (GOWN DISPOSABLE) ×1 IMPLANT
GOWN BRE IMP SLV AUR XL STRL (GOWN DISPOSABLE) IMPLANT
GOWN STRL REIN 2XL LVL4 (GOWN DISPOSABLE) ×3 IMPLANT
KIT BASIN OR (CUSTOM PROCEDURE TRAY) ×2 IMPLANT
KIT ROOM TURNOVER OR (KITS) ×2 IMPLANT
NDL SPNL 20GX3.5 QUINCKE YW (NEEDLE) IMPLANT
NEEDLE HYPO 22GX1.5 SAFETY (NEEDLE) ×2 IMPLANT
NEEDLE SPNL 20GX3.5 QUINCKE YW (NEEDLE) ×2 IMPLANT
NS IRRIG 1000ML POUR BTL (IV SOLUTION) ×2 IMPLANT
PACK LAMINECTOMY NEURO (CUSTOM PROCEDURE TRAY) ×2 IMPLANT
PAD ARMBOARD 7.5X6 YLW CONV (MISCELLANEOUS) ×6 IMPLANT
PATTIES SURGICAL .5 X1 (DISPOSABLE) ×1 IMPLANT
RUBBERBAND STERILE (MISCELLANEOUS) ×2 IMPLANT
SPONGE GAUZE 4X4 12PLY (GAUZE/BANDAGES/DRESSINGS) ×1 IMPLANT
SPONGE SURGIFOAM ABS GEL SZ50 (HEMOSTASIS) ×2 IMPLANT
SUT VIC AB 1 CT1 18XBRD ANBCTR (SUTURE) ×1 IMPLANT
SUT VIC AB 1 CT1 8-18 (SUTURE) ×2
SUT VIC AB 2-0 CP2 18 (SUTURE) ×2 IMPLANT
SUT VIC AB 3-0 SH 8-18 (SUTURE) ×2 IMPLANT
SYR 20ML ECCENTRIC (SYRINGE) ×2 IMPLANT
TOWEL OR 17X24 6PK STRL BLUE (TOWEL DISPOSABLE) ×2 IMPLANT
TOWEL OR 17X26 10 PK STRL BLUE (TOWEL DISPOSABLE) ×2 IMPLANT
WATER STERILE IRR 1000ML POUR (IV SOLUTION) ×2 IMPLANT

## 2012-02-01 NOTE — Anesthesia Postprocedure Evaluation (Signed)
Anesthesia Post Note  Patient: Susan Ford  Procedure(s) Performed: Procedure(s) (LRB): LUMBAR LAMINECTOMY/DECOMPRESSION MICRODISCECTOMY 2 LEVELS (Left)  Anesthesia type: general  Patient location: PACU  Post pain: Pain level controlled  Post assessment: Patient's Cardiovascular Status Stable  Last Vitals:  Filed Vitals:   02/01/12 1136  BP: 134/77  Pulse: 103  Temp:   Resp: 29    Post vital signs: Reviewed and stable  Level of consciousness: sedated  Complications: No apparent anesthesia complications

## 2012-02-01 NOTE — Op Note (Signed)
Preoperative diagnosis: Spondylosis L4-5, L5-S1 with left lumbar radiculopathy L4-L5 and S1 Postoperative diagnosis: Spondylosis L4-5 L5-S1 with left lumbar radiculopathy L4, L5 and S1 Procedure: Laminotomy and foraminotomies L4-5 L5-S1 left with operating microscope microdissection technique, discectomy L4-5 left Surgeon: Barnett Abu Assistant: Hilda Lias M.D. Anesthesia: Gen. endotracheal Indications: Patient is a 78 year old individual is had significant back and left lower stomach pain she is failed all manner of conservative treatment including epidural steroid injections physical therapy nonsteroidal anti-inflammatories. She's had progressive degenerative changes at L4-5 and L5-S1, there is been suggested that she may benefit from surgical decompression and she is now being taken to the operating room to undergo this procedure  Procedure: Patient was brought to the operating room supine on a stretcher after the smooth induction of general endotracheal anesthesia she was turned prone the back was shaved prepped with alcohol and DuraPrep and draped in a sterile fashion. Needle localization at L5 was performed with a singular x-ray being obtained and then a vertical incision was created over this region and carried down to the lumbar dorsal fascia. The lumbar dorsal fascia was opened on left side of the midline. Subperiosteal dissection was performed to expose the interlaminar space at L4-5 and L5-S1. A second localizing radiograph confirmed each of the spaces with a probe in each. Then laminotomies were created removing the inferior margin of the lamina of L4 out to and including the facet joint at L4-L5 similar process was carried out at L5-S1 yellow ligament was thickened and redundant and it was lifted in each of these areas to expose the common dural tube.  At L4-5 laminotomy was enlarged and the superior portion was dissected out to expose the exit foramen for the L4 nerve root superiorly  redundant ligamentous material was removed from this region to allow for decompression into the foramen for the L4 nerve root inferiorly there was noted to be a significant ridge of disc material. This was dissected in a foraminotomy was created below this for the L5 nerve root. The ridge of the disc however was quite prominent there are some adhesions in this region these were released. A discectomy at L4-5 was then performed. The disc space was not able to be entered. In the end however, the L5 nerve root was well decompressed.  Attention was then turned to L5-S1 were laminotomy was enlarged superiorly the path for the L5 nerve root was blocked by a portion of the superior articular process from S1. This was dissected using a 2 mm upgoing Kerrison punch. The path of the L5 nerve root in the foraminal zone was therefore decompressed. The S1 nerve root was decompressed and the foramen with a simple foraminotomies. Further exploration yielded a small ridge of the disc at L5-S1 but this did not appear to impede the path of the S1 nerve root. Hemostasis was then carefully obtained.  The retractor was removed the microscope was removed as all the former dissection was done with the use of the operating microscope and the help of Dr. Jeral Fruit who provided retraction and exposure of the exiting nerve roots. Lumbar dorsal fascia was closed with #1 Vicryl in an interrupted fashion 2-0 Vicryl was used in the subcutaneous tissues and 3-0 Vicryl was used subcuticularly blood loss was estimated at 50 cc. The patient was returned to the recovery room in stable condition.

## 2012-02-01 NOTE — Progress Notes (Signed)
Patient ID: Susan Ford, female   DOB: November 27, 1929, 78 y.o.   MRN: [id number] Vital signs are stable. Patient is ambulatory. Leg is feeling much better however she is having back soreness. We'll spend overnight in hospital. Incision is clean and dry.

## 2012-02-01 NOTE — Anesthesia Procedure Notes (Signed)
Procedure Name: Intubation Date/Time: 02/01/2012 10:02 AM Performed by: Darcey Nora B Pre-anesthesia Checklist: Patient identified, Emergency Drugs available, Suction available and Patient being monitored Patient Re-evaluated:Patient Re-evaluated prior to inductionOxygen Delivery Method: Circle system utilized Preoxygenation: Pre-oxygenation with 100% oxygen Intubation Type: IV induction Ventilation: Mask ventilation without difficulty Laryngoscope Size: Mac and 3 Grade View: Grade I Tube type: Oral Tube size: 7.5 mm Number of attempts: 1 Airway Equipment and Method: Stylet Placement Confirmation: ETT inserted through vocal cords under direct vision,  breath sounds checked- equal and bilateral and positive ETCO2 Secured at: 20 (cm at teeth) cm Tube secured with: Tape Dental Injury: Teeth and Oropharynx as per pre-operative assessment

## 2012-02-01 NOTE — Preoperative (Signed)
Beta Blockers   Reason not to administer Beta Blockers:Not Applicable 

## 2012-02-01 NOTE — H&P (Signed)
Susan Ford is an 78 y.o. female.   Chief Complaint: Back pain left lower extremity pain HPI: Azyiah Sallee   #[id number] DOB: 1929/12/03 Mac[place] returns to the office today. She has been having a persistent left lumbar radicular pain and a new MRI was performed. The MRI demonstrates that there has been some advancement of the degenerative process at L5-S1.  This involves the central canal and both lateral recesses; however, her symptoms are primarily and only left-sided.  I note that the L5 foramen on the left side along with the L4 foramen on the left side is severely stenotic secondary to the degenerative changes there.    I indicated to Makeda that to fix this fully would require a decompression and a fusion of the lower two lumbar spinal levels and this is a substantial undertaking; however, in a more limited fashion we could do foraminotomies for the L4 and the L5 nerve root on that left side alone.  The risk here is that it may destabilize the spine such that she starts to get symptoms on the opposite side.  I believe though a careful laminotomy and foraminotomy should go a long way to give her relief of her worst left-sided radicular pain, which I believe involves both left L4 and the L5 nerve roots.  I indicated that we would do this via a laminotomy and foraminotomy on the left side alone.  The main risks of the surgery are those as with any other surgery, those of spinal fluid leak and the potential for nerve damage; however, in terms of the caliber of the surgery, it is something that I believe that she should be able to tolerate with minimal risks otherwise. We will plan on scheduling the surgical intervention at the earliest convenience.  Jamine has not responded well to epidural steroid injections in the past and she has been suffering with this pain for a considerable length of time.  Past Medical History  Diagnosis Date  . GERD (gastroesophageal reflux disease)     Had endoscopy no  history of Barrett's  . Arthritis   . Neuromuscular disorder   . Cataract 2007    bilateral cat ext     Past Surgical History  Procedure Date  . Abdominal hysterectomy     Nine reasons  . Tonsillectomy     Family History  Problem Relation Age of Onset  . Neurodegenerative disease Mother     Franco Nones Drager died at 6  . Alcohol abuse    . Heart disease     Social History:  reports that she has quit smoking. She does not have any smokeless tobacco history on file. She reports that she drinks about 4.2 ounces of alcohol per week. She reports that she uses illicit drugs (Hydrocodone).  Allergies:  Allergies  Allergen Reactions  . Oxytetracycline     Medications Prior to Admission  Medication Sig Dispense Refill  . ascorbic acid (VITAMIN C) 1000 MG tablet Take 1,000 mg by mouth daily.      Marland Kitchen aspirin 81 MG chewable tablet Chew 81 mg by mouth daily.        . calcium citrate-vitamin D (CITRACAL+D) 315-200 MG-UNIT per tablet Take 1 tablet by mouth daily.        . celecoxib (CELEBREX) 200 MG capsule Take 200 mg by mouth daily.        Marland Kitchen estradiol (VIVELLE-DOT) 0.025 MG/24HR Place 1 patch onto the skin 2 (two) times a week.      Marland Kitchen  famotidine (PEPCID) 40 MG tablet Take 40 mg by mouth every morning.       . gabapentin (NEURONTIN) 600 MG tablet Take 600 mg by mouth 4 (four) times daily.       Marland Kitchen HYDROcodone-ibuprofen (VICOPROFEN) 7.5-200 MG per tablet Take 1 tablet by mouth every 8 (eight) hours as needed. For pain      . Multiple Vitamin (MULTIVITAMIN) tablet Take 1 tablet by mouth daily.        . simvastatin (ZOCOR) 40 MG tablet Take 40 mg by mouth at bedtime.          No results found for this or any previous visit (from the past 48 hour(s)). No results found.  Review of Systems  HENT: Negative.   Eyes: Negative.   Respiratory: Negative.   Cardiovascular: Negative.   Gastrointestinal: Negative.   Genitourinary: Negative.   Musculoskeletal: Positive for back pain.  Skin: Negative.    Neurological: Positive for sensory change, focal weakness and weakness.  Endo/Heme/Allergies: Negative.   Psychiatric/Behavioral: Negative.     Blood pressure 148/90, pulse 91, temperature 98 F (36.7 C), temperature source Oral, resp. rate 18, SpO2 97.00%. Physical Exam  Constitutional: She is oriented to person, place, and time. She appears well-developed and well-nourished.  HENT:  Head: Normocephalic and atraumatic.  Eyes: Conjunctivae normal and EOM are normal. Pupils are equal, round, and reactive to light.  Neck: Normal range of motion. Neck supple.  Cardiovascular: Normal rate, regular rhythm and normal heart sounds.   Respiratory: Effort normal and breath sounds normal.  GI: Soft. Bowel sounds are normal.  Musculoskeletal:       Mild weakness and gastroc and tibialis anterior on left positive straight leg raising on left side at 45  Neurological: She is alert and oriented to person, place, and time. She has normal reflexes. She displays normal reflexes. No cranial nerve deficit.  Skin: Skin is warm and dry.  Psychiatric: She has a normal mood and affect. Her behavior is normal. Judgment and thought content normal.     Assessment/Plan Spondylosis L4-5 L5-S1 with left lumbar radiculopathy  Laminotomies foraminotomies for L4 and L5 nerve roots on the left with operating  Duayne Brideau J 02/01/2012, 9:33 AM

## 2012-02-01 NOTE — Assessment & Plan Note (Signed)
Pt denies this problem

## 2012-02-01 NOTE — Transfer of Care (Signed)
Immediate Anesthesia Transfer of Care Note  Patient: Susan Ford  Procedure(s) Performed: Procedure(s) (LRB) with comments: LUMBAR LAMINECTOMY/DECOMPRESSION MICRODISCECTOMY 2 LEVELS (Left) - Left Lumbar Four-Five Lumbar Five-Sacral One Laminectomies/Foraminotomies/Microscope  Patient Location: PACU  Anesthesia Type: General  Level of Consciousness: awake, alert , oriented and patient cooperative  Airway & Oxygen Therapy: Patient Spontanous Breathing and Patient connected to nasal cannula oxygen  Post-op Assessment: Report given to PACU RN, Post -op Vital signs reviewed and stable and Patient moving all extremities  Post vital signs: Reviewed and stable  Complications: No apparent anesthesia complications

## 2012-02-01 NOTE — Assessment & Plan Note (Signed)
Pt denies this problem 

## 2012-02-01 NOTE — Anesthesia Preprocedure Evaluation (Addendum)
Anesthesia Evaluation  Patient identified by MRN, date of birth, ID band Patient awake    Reviewed: Allergy & Precautions, H&P , NPO status , Patient's Chart, lab work & pertinent test results, reviewed documented beta blocker date and time   Airway Mallampati: I TM Distance: >3 FB Neck ROM: Full    Dental  (+) Teeth Intact and Dental Advisory Given   Pulmonary          Cardiovascular     Neuro/Psych    GI/Hepatic GERD-  Medicated and Controlled,  Endo/Other    Renal/GU      Musculoskeletal   Abdominal   Peds  Hematology   Anesthesia Other Findings   Reproductive/Obstetrics                         Anesthesia Physical Anesthesia Plan  ASA: II  Anesthesia Plan: General   Post-op Pain Management:    Induction: Intravenous  Airway Management Planned: Oral ETT  Additional Equipment:   Intra-op Plan:   Post-operative Plan: Extubation in OR  Informed Consent: I have reviewed the patients History and Physical, chart, labs and discussed the procedure including the risks, benefits and alternatives for the proposed anesthesia with the patient or authorized representative who has indicated his/her understanding and acceptance.     Plan Discussed with: CRNA and Surgeon  Anesthesia Plan Comments:         Anesthesia Quick Evaluation

## 2012-02-02 DIAGNOSIS — IMO0002 Reserved for concepts with insufficient information to code with codable children: Secondary | ICD-10-CM | POA: Diagnosis not present

## 2012-02-02 DIAGNOSIS — M47817 Spondylosis without myelopathy or radiculopathy, lumbosacral region: Secondary | ICD-10-CM | POA: Diagnosis not present

## 2012-02-02 MED ORDER — OXYCODONE-ACETAMINOPHEN 5-325 MG PO TABS: 1.0000 | ORAL_TABLET | ORAL | Status: DC | PRN

## 2012-02-02 MED ORDER — DIAZEPAM 5 MG PO TABS: 5.0000 mg | ORAL_TABLET | Freq: Four times a day (QID) | ORAL | Status: DC | PRN

## 2012-02-02 NOTE — Discharge Summary (Signed)
Physician Discharge Summary  Patient ID: [name] MRN: [id number] DOB/AGE: [birth year] 78 y.o.  Admit date: 02/01/2012 Discharge date: 02/02/2012  Admission Diagnoses: Lumbar spondylosis and radiculopathy L4-5 and L5-S1 on left  Discharge Diagnoses: Susan Ford spondylosis and radiculopathy L4-5 L5-S1 on left, herniated nucleus pulposus L4-5 Active Problems:  * No active hospital problems. *    Discharged Condition: good  Hospital Course: Patient was admitted to undergo surgical decompression at L4-5 and L5-S1. She tolerated the surgery well. She's been ambulating and is doing so independently. She is discharged home.  Consults: None  Significant Diagnostic Studies: MRI of lumbar spine demonstrating spondylosis L4-5 and L5-S1  Treatments: surgery: Laminotomy foraminotomies L4-5 L5-S1 left microdiscectomy L4-5 left  Discharge Exam: Blood pressure 111/70, pulse 75, temperature 99 F (37.2 C), temperature source Oral, resp. rate 16, SpO2 92.00%. Motor function 5 out of 5 in iliopsoas quadriceps tibialis anterior and gastrocs on the left incision clean and dry  Disposition: 01-Home or Self Care  Discharge Orders    Future Appointments: Provider: Department: Dept Phone: Center:   02/15/2012 12:50 PM [facility] [phone] [facility]     Future Orders Please Complete By Expires   Diet - low sodium heart healthy      Increase activity slowly      Discharge instructions      Comments:   Okay to shower. Do not apply salves or appointments to incision. No heavy lifting with the upper extremities greater than 15 pounds. May resume driving when not requiring pain medication and patient feels comfortable with doing so.   Call MD for:  redness, tenderness, or signs of infection (pain, swelling, redness, odor or green/yellow discharge around incision site)      Call MD for:  severe uncontrolled pain      Call MD for:  temperature >100.4          Medication List       As of 02/02/2012  9:23 AM    TAKE these medications         ascorbic acid 1000 MG tablet   Commonly known as: VITAMIN C   Take 1,000 mg by mouth daily.      aspirin 81 MG chewable tablet   Chew 81 mg by mouth daily.      calcium citrate-vitamin D 315-200 MG-UNIT per tablet   Commonly known as: CITRACAL+D   Take 1 tablet by mouth daily.      celecoxib 200 MG capsule   Commonly known as: CELEBREX   Take 200 mg by mouth daily.      diazepam 5 MG tablet   Commonly known as: VALIUM   Take 1 tablet (5 mg total) by mouth every 6 (six) hours as needed (Muscle spasm).      estradiol 0.025 MG/24HR   Commonly known as: VIVELLE-DOT   Place 1 patch onto the skin 2 (two) times a week.      famotidine 40 MG tablet   Commonly known as: PEPCID   Take 40 mg by mouth every morning.      gabapentin 600 MG tablet   Commonly known as: NEURONTIN   Take 600 mg by mouth 4 (four) times daily.      HYDROcodone-ibuprofen 7.5-200 MG per tablet   Commonly known as: VICOPROFEN   Take 1 tablet by mouth every 8 (eight) hours as needed. For pain      multivitamin tablet   Take 1 tablet by mouth daily.  oxyCODONE-acetaminophen 5-325 MG per tablet   Commonly known as: PERCOCET/ROXICET   Take 1 tablet by mouth every 4 (four) hours as needed for pain.      simvastatin 40 MG tablet   Commonly known as: ZOCOR   Take 40 mg by mouth at bedtime.         SignedStefani Dama 02/02/2012, 9:23 AM

## 2012-02-02 NOTE — Progress Notes (Signed)
Pt given D/C instructions with Rx's, verbal understanding given. Pt D/C'd home via wheelchair @ 1020 per MD order. Vaughan Garfinkle, RN 

## 2012-02-02 NOTE — Evaluation (Signed)
Occupational Therapy Evaluation Patient Details Name: Susan Ford MRN: [id number] DOB: 1930/02/18 Today's Date: 02/02/2012 Time: 5621-3086 OT Time Calculation (min): 13 min  OT Assessment / Plan / Recommendation Clinical Impression  Pt s/p L4-S1 decompression/microdiscectomy. Able to perform ADLs and functional mobility at supervision/mod I level. All education completed. No further acute OT needs.    OT Assessment  Patient does not need any further OT services    Follow Up Recommendations  No OT follow up    Barriers to Discharge      Equipment Recommendations  None recommended by OT    Recommendations for Other Services    Frequency       Precautions / Restrictions Precautions Precautions: Back Precaution Booklet Issued: No Precaution Comments: Educated on 3/3 back precautions.   Pertinent Vitals/Pain See vitals    ADL  Eating/Feeding: Performed;Independent Where Assessed - Eating/Feeding: Edge of bed Upper Body Dressing: Performed;Independent Where Assessed - Upper Body Dressing: Unsupported sitting Lower Body Dressing: Performed;Modified independent Where Assessed - Lower Body Dressing: Unsupported sitting Toilet Transfer: Simulated;Supervision/safety Toilet Transfer Method: Sit to Barista:  (bed to ambulate in hallway and return to bed) Tub/Shower Transfer: Simulated;Supervision/safety Tub/Shower Transfer Method: Science writer: Walk in shower Transfers/Ambulation Related to ADLs: supervision for safety ADL Comments: Educated pt on back precautions and techniques to maintain precautions during ADL activity. Pt able to don/doff socks with ankles crossed over knees.     OT Diagnosis:    OT Problem List:   OT Treatment Interventions:     OT Goals    Visit Information  Last OT Received On: 02/02/12 Assistance Needed: +1    Subjective Data      Prior Functioning     Home Living Lives With:  Spouse Available Help at Discharge: Family;Available 24 hours/day Type of Home: House Home Access: Stairs to enter [employer] of Steps: 16 Entrance Stairs-Rails: Right Home Layout: One level Bathroom Shower/Tub: Walk-in shower;Door [employer] Toilet: Standard Home Adaptive Equipment: Straight cane;Shower chair with back Prior Function Level of Independence: Independent Able to Take Stairs?: Yes Driving: Yes Vocation: Retired Musician: No difficulties         Vision/Perception     Cognition  Overall Cognitive Status: Appears within functional limits for tasks assessed/performed Arousal/Alertness: Awake/alert Orientation Level: Appears intact for tasks assessed Behavior During Session: [facility] for tasks performed    Extremity/Trunk Assessment Right Upper Extremity Assessment RUE ROM/Strength/Tone: Within functional levels RUE Sensation: WFL - Light Touch;WFL - Proprioception RUE Coordination: WFL - gross/fine motor Left Upper Extremity Assessment LUE ROM/Strength/Tone: Within functional levels LUE Sensation: WFL - Light Touch;WFL - Proprioception LUE Coordination: WFL - gross/fine motor     Mobility Bed Mobility Bed Mobility: Rolling Right;Rolling Left;Right Sidelying to Sit;Sit to Sidelying Right Rolling Right: 6: Modified independent (Device/Increase time) Rolling Left: 6: Modified independent (Device/Increase time) Right Sidelying to Sit: 5: Supervision Sit to Sidelying Right: 5: Supervision Details for Bed Mobility Assistance: VC for technique and sequencing Transfers Transfers: Sit to Stand;Stand to Sit Sit to Stand: 6: Modified independent (Device/Increase time);From bed Stand to Sit: 6: Modified independent (Device/Increase time);To bed     Shoulder Instructions     Exercise     Balance     End of Session OT - End of Session Equipment Utilized During Treatment:  (none) Activity Tolerance: Patient tolerated treatment  well Patient left: in bed;with call bell/phone within reach;with family/visitor present Nurse Communication: Mobility status  GO    02/02/2012 Laural Benes,  Saverio Danker OTR/L Pager [phone] Office [phone]  Cipriano Mile 02/02/2012, 2:59 PM

## 2012-02-02 NOTE — Evaluation (Signed)
Physical Therapy Evaluation Patient Details Name: Susan Ford MRN: [id number] DOB: 01-11-1930 Today's Date: 02/02/2012 Time: 4098-1191 PT Time Calculation (min): 13 min  PT Assessment / Plan / Recommendation Clinical Impression  Pt is an 78 y/o female admitted s/p L4-S1 decompression/microdiscectomy. Pt at modified independent to supervision level for all mobility. No further PT needs identified with education complete. Signing off without follow-up needed. Thanks.    PT Assessment  Patent does not need any further PT services    Follow Up Recommendations  No PT follow up    Does the patient have the potential to tolerate intense rehabilitation      Barriers to Discharge        Equipment Recommendations  None recommended by PT    Recommendations for Other Services     Frequency      Precautions / Restrictions Precautions Precautions: Back Precaution Booklet Issued: No Precaution Comments: Educated on 3/3 back precautions. Restrictions Weight Bearing Restrictions: No   Pertinent Vitals/Pain None      Mobility  Bed Mobility Bed Mobility: Rolling Right;Rolling Left;Right Sidelying to Sit;Sit to Sidelying Right Rolling Right: 6: Modified independent (Device/Increase time) Rolling Left: 6: Modified independent (Device/Increase time) Right Sidelying to Sit: 5: Supervision;With rails;HOB flat Sit to Sidelying Right: 5: Supervision;With rail;HOB flat Details for Bed Mobility Assistance: Verbal cues for safest sequence to protect back. Transfers Transfers: Sit to Stand;Stand to Sit Sit to Stand: 6: Modified independent (Device/Increase time) Stand to Sit: 6: Modified independent (Device/Increase time) Ambulation/Gait Ambulation/Gait Assistance: 5: Supervision Ambulation Distance (Feet): 200 Feet Assistive device: None Ambulation/Gait Assistance Details: Verbal cues for safety and extended trunk to tall posture. Gait Pattern: Step-through pattern;Trunk  flexed Stairs: Yes Stairs Assistance: 5: Supervision Stairs Assistance Details (indicate cue type and reason): Verbal cues for safety and to take time to prevent falls. Stair Management Technique: One rail Right;Alternating pattern;Forwards Number of Stairs: 12  Wheelchair Mobility Wheelchair Mobility: No    Shoulder Instructions     Exercises     PT Diagnosis:    PT Problem List:   PT Treatment Interventions:     PT Goals    Visit Information  Last PT Received On: 02/02/12 Assistance Needed: +1 PT/OT Co-Evaluation/Treatment: Yes    Subjective Data  Subjective: "I get to go home today." Patient Stated Goal: Go home.   Prior Functioning  Home Living Lives With: Spouse Available Help at Discharge: Family;Available 24 hours/day Type of Home: House Home Access: Stairs to enter [employer] of Steps: 16 Entrance Stairs-Rails: Right Home Layout: One level Bathroom Shower/Tub: Walk-in shower;Door [employer] Toilet: Standard Home Adaptive Equipment: Straight cane Prior Function Level of Independence: Independent Able to Take Stairs?: Yes Driving: Yes Vocation: Retired Musician: No difficulties    Cognition  Overall Cognitive Status: Appears within functional limits for tasks assessed/performed Arousal/Alertness: Awake/alert Orientation Level: Appears intact for tasks assessed Behavior During Session: [facility] for tasks performed    Extremity/Trunk Assessment Right Upper Extremity Assessment RUE ROM/Strength/Tone: Within functional levels RUE Sensation: WFL - Light Touch RUE Coordination: WFL - gross/fine motor Left Upper Extremity Assessment LUE ROM/Strength/Tone: Within functional levels LUE Sensation: WFL - Light Touch LUE Coordination: WFL - gross/fine motor Right Lower Extremity Assessment RLE ROM/Strength/Tone: Within functional levels RLE Sensation: WFL - Light Touch RLE Coordination: WFL - gross/fine motor Left Lower Extremity  Assessment LLE ROM/Strength/Tone: Within functional levels LLE Sensation: WFL - Light Touch LLE Coordination: WFL - gross/fine motor Trunk Assessment Trunk Assessment: Kyphotic   Balance Balance Balance  Assessed: No  End of Session PT - End of Session Equipment Utilized During Treatment: Gait belt Activity Tolerance: Patient tolerated treatment well Patient left: in bed;with call bell/phone within reach;with family/visitor present Nurse Communication: Mobility status  GP     Cephus Shelling 02/02/2012, 9:38 AM  02/02/2012 Cephus Shelling, PT, DPT [phone]

## 2012-02-04 ENCOUNTER — Encounter (HOSPITAL_COMMUNITY): Payer: Self-pay | Admitting: Neurological Surgery

## 2012-02-04 ENCOUNTER — Ambulatory Visit (HOSPITAL_COMMUNITY)
Admission: RE | Admit: 2012-02-04 | Discharge: 2012-02-04 | Disposition: A | Discharge status: Home / Self Care | Payer: Medicare Other | Source: Ambulatory Visit | Attending: Neurological Surgery | Admitting: Neurological Surgery

## 2012-02-04 DIAGNOSIS — M79609 Pain in unspecified limb: Secondary | ICD-10-CM | POA: Diagnosis not present

## 2012-02-04 DIAGNOSIS — R52 Pain, unspecified: Secondary | ICD-10-CM | POA: Diagnosis not present

## 2012-02-04 DIAGNOSIS — M7989 Other specified soft tissue disorders: Secondary | ICD-10-CM | POA: Diagnosis not present

## 2012-02-04 DIAGNOSIS — R609 Edema, unspecified: Secondary | ICD-10-CM | POA: Insufficient documentation

## 2012-02-04 NOTE — Progress Notes (Signed)
Left:  No evidence of DVT, superficial thrombosis, or Baker's cyst.  Right:  Negative for DVT in the common femoral vein.  

## 2012-02-09 ENCOUNTER — Ambulatory Visit: Payer: Medicare Other

## 2012-02-15 ENCOUNTER — Ambulatory Visit
Admission: RE | Admit: 2012-02-15 | Discharge: 2012-02-15 | Disposition: A | Discharge status: Home / Self Care | Payer: Medicare Other | Source: Ambulatory Visit | Attending: Gynecology | Admitting: Gynecology

## 2012-02-15 DIAGNOSIS — Z1231 Encounter for screening mammogram for malignant neoplasm of breast: Secondary | ICD-10-CM

## 2012-02-29 DIAGNOSIS — S8010XA Contusion of unspecified lower leg, initial encounter: Secondary | ICD-10-CM | POA: Diagnosis not present

## 2012-02-29 DIAGNOSIS — M25579 Pain in unspecified ankle and joints of unspecified foot: Secondary | ICD-10-CM | POA: Diagnosis not present

## 2012-02-29 DIAGNOSIS — M48061 Spinal stenosis, lumbar region without neurogenic claudication: Secondary | ICD-10-CM | POA: Diagnosis not present

## 2012-03-13 DIAGNOSIS — M25579 Pain in unspecified ankle and joints of unspecified foot: Secondary | ICD-10-CM | POA: Diagnosis not present

## 2012-04-05 DIAGNOSIS — M25579 Pain in unspecified ankle and joints of unspecified foot: Secondary | ICD-10-CM | POA: Diagnosis not present

## 2012-04-06 DIAGNOSIS — Z0000 Encounter for general adult medical examination without abnormal findings: Secondary | ICD-10-CM | POA: Diagnosis not present

## 2012-04-06 DIAGNOSIS — R350 Frequency of micturition: Secondary | ICD-10-CM | POA: Diagnosis not present

## 2012-04-06 DIAGNOSIS — E7800 Pure hypercholesterolemia, unspecified: Secondary | ICD-10-CM | POA: Diagnosis not present

## 2012-04-06 DIAGNOSIS — R7989 Other specified abnormal findings of blood chemistry: Secondary | ICD-10-CM | POA: Diagnosis not present

## 2012-04-06 DIAGNOSIS — N8184 Pelvic muscle wasting: Secondary | ICD-10-CM | POA: Diagnosis not present

## 2012-04-13 DIAGNOSIS — IMO0002 Reserved for concepts with insufficient information to code with codable children: Secondary | ICD-10-CM | POA: Diagnosis not present

## 2012-05-03 DIAGNOSIS — M25579 Pain in unspecified ankle and joints of unspecified foot: Secondary | ICD-10-CM | POA: Diagnosis not present

## 2012-05-08 DIAGNOSIS — Z961 Presence of intraocular lens: Secondary | ICD-10-CM | POA: Diagnosis not present

## 2012-06-05 DIAGNOSIS — R350 Frequency of micturition: Secondary | ICD-10-CM | POA: Diagnosis not present

## 2012-06-07 DIAGNOSIS — M25579 Pain in unspecified ankle and joints of unspecified foot: Secondary | ICD-10-CM | POA: Diagnosis not present

## 2012-06-23 DIAGNOSIS — G609 Hereditary and idiopathic neuropathy, unspecified: Secondary | ICD-10-CM | POA: Diagnosis not present

## 2012-07-03 DIAGNOSIS — M25579 Pain in unspecified ankle and joints of unspecified foot: Secondary | ICD-10-CM | POA: Diagnosis not present

## 2012-07-07 ENCOUNTER — Other Ambulatory Visit: Payer: Self-pay | Admitting: Dermatology

## 2012-07-07 DIAGNOSIS — L851 Acquired keratosis [keratoderma] palmaris et plantaris: Secondary | ICD-10-CM | POA: Diagnosis not present

## 2012-07-07 DIAGNOSIS — L570 Actinic keratosis: Secondary | ICD-10-CM | POA: Diagnosis not present

## 2012-07-12 DIAGNOSIS — IMO0002 Reserved for concepts with insufficient information to code with codable children: Secondary | ICD-10-CM | POA: Diagnosis not present

## 2012-07-13 ENCOUNTER — Telehealth: Payer: Self-pay | Admitting: Neurology

## 2012-07-13 MED ORDER — GABAPENTIN 600 MG PO TABS: 600.0000 mg | ORAL_TABLET | Freq: Four times a day (QID) | ORAL | Status: DC

## 2012-07-13 NOTE — Telephone Encounter (Signed)
Pt called LM that she is having side effect of (nausea, belching, bloating), itching, abominal pain since yesterday evening after taking the tegretol 100mg  po tid (with meals).  She did not take this morning and states that she is better.  She stopped the gabapentin (after weaning down on 07-11-12).  She relayed that she does have spastic colon.  No fever.  Had taken lyrica and had nausea with this.  Please advise on what to do next.   She has appt with you 09-22-12.

## 2012-07-13 NOTE — Telephone Encounter (Signed)
I have called her, she complains of bilateral lower extremity and feet pain, she is off gabapentin now, she used to take 600 mg one and half tablets 4 times a day, carbamazepine causing itching, she is no longer taking it, I have advised her to continue hydrocodone half to one tablet as needed at previously prescribed,  I will just restart gabapentin, new prescription 600 mg, titrating as instructed, up to one tablet 4 times a day

## 2012-07-14 ENCOUNTER — Ambulatory Visit (INDEPENDENT_AMBULATORY_CARE_PROVIDER_SITE_OTHER): Payer: Medicare Other | Admitting: Internal Medicine

## 2012-07-14 ENCOUNTER — Encounter: Payer: Self-pay | Admitting: Internal Medicine

## 2012-07-14 VITALS — BP 124/76 | HR 78 | Temp 98.9°F | Wt 151.0 lb

## 2012-07-14 DIAGNOSIS — G609 Hereditary and idiopathic neuropathy, unspecified: Secondary | ICD-10-CM

## 2012-07-14 DIAGNOSIS — R110 Nausea: Secondary | ICD-10-CM | POA: Diagnosis not present

## 2012-07-14 DIAGNOSIS — R350 Frequency of micturition: Secondary | ICD-10-CM | POA: Diagnosis not present

## 2012-07-14 DIAGNOSIS — Z79899 Other long term (current) drug therapy: Secondary | ICD-10-CM | POA: Diagnosis not present

## 2012-07-14 DIAGNOSIS — G629 Polyneuropathy, unspecified: Secondary | ICD-10-CM

## 2012-07-14 LAB — CBC WITH DIFFERENTIAL/PLATELET
Basophils Absolute: 0.1 10*3/uL (ref 0.0–0.1)
Basophils Relative: 1.1 % (ref 0.0–3.0)
Eosinophils Absolute: 0.2 10*3/uL (ref 0.0–0.7)
Eosinophils Relative: 3.1 % (ref 0.0–5.0)
HCT: 39.5 % (ref 36.0–46.0)
Hemoglobin: 13.5 g/dL (ref 12.0–15.0)
Lymphocytes Relative: 18.4 % (ref 12.0–46.0)
Lymphs Abs: 1.2 10*3/uL (ref 0.7–4.0)
MCHC: 34.2 g/dL (ref 30.0–36.0)
MCV: 90.7 fl (ref 78.0–100.0)
Monocytes Absolute: 0.4 10*3/uL (ref 0.1–1.0)
Monocytes Relative: 5.8 % (ref 3.0–12.0)
Neutro Abs: 4.8 10*3/uL (ref 1.4–7.7)
Neutrophils Relative %: 71.6 % (ref 43.0–77.0)
Platelets: 235 10*3/uL (ref 150.0–400.0)
RBC: 4.35 Mil/uL (ref 3.87–5.11)
RDW: 12.6 % (ref 11.5–14.6)
WBC: 6.8 10*3/uL (ref 4.5–10.5)

## 2012-07-14 LAB — POCT URINALYSIS DIPSTICK
Bilirubin, UA: NEGATIVE
Blood, UA: NEGATIVE
Glucose, UA: NEGATIVE
Ketones, UA: NEGATIVE
Leukocytes, UA: NEGATIVE
Nitrite, UA: NEGATIVE
Protein, UA: NEGATIVE
Spec Grav, UA: 1.015
Urobilinogen, UA: 0.2
pH, UA: 7

## 2012-07-14 LAB — HEPATIC FUNCTION PANEL
ALT: 16 U/L (ref 0–35)
AST: 23 U/L (ref 0–37)
Albumin: 4.2 g/dL (ref 3.5–5.2)
Alkaline Phosphatase: 50 U/L (ref 39–117)
Bilirubin, Direct: 0.1 mg/dL (ref 0.0–0.3)
Total Bilirubin: 0.9 mg/dL (ref 0.3–1.2)
Total Protein: 6.9 g/dL (ref 6.0–8.3)

## 2012-07-14 LAB — BASIC METABOLIC PANEL
BUN: 11 mg/dL (ref 6–23)
CO2: 23 mEq/L (ref 19–32)
Calcium: 8.8 mg/dL (ref 8.4–10.5)
Chloride: 98 mEq/L (ref 96–112)
Creatinine, Ser: 0.6 mg/dL (ref 0.4–1.2)
GFR: 98.18 mL/min (ref >60.00)
Glucose, Bld: 108 mg/dL — ABNORMAL HIGH (ref 70–99)
Potassium: 4.2 mEq/L (ref 3.5–5.1)
Sodium: 129 mEq/L — ABNORMAL LOW (ref 135–145)

## 2012-07-14 LAB — TSH: TSH: 0.69 u[IU]/mL (ref 0.35–5.50)

## 2012-07-14 MED ORDER — PROMETHAZINE HCL 12.5 MG PO TABS: 12.5000 mg | ORAL_TABLET | Freq: Four times a day (QID) | ORAL | Status: DC | PRN

## 2012-07-14 NOTE — Progress Notes (Signed)
Chief Complaint  Patient presents with  . Nausea    Started on Tegretol on Wednesday.  Believes the nausea is coming from the new med.  Has not had any medication since Wednesday.  . Urinary Frequency    HPI: Patient comes in today for SDA for  new problem evaluation. Comes in today with husband because of the above problem. She had been weaning off the Neurontin her neurologist and begin Tegretol 2 days ago. However she began to have extreme nausea stopped the Tegretol but still has extreme nausea since that time. Has burping but no chest pain shortness of breath cough;does have some chills but no fever. She's also noticed most recently having urinary frequency with no dysuria or flank pain.Marland Kitchen  Neurologist has her going back on the Neurontin. Hasn't started it yet. ROS: See pertinent positives and negatives per HPI. No diarrhea and no active vomiting no specific abdominal pain neurologic changes or falling.  Past Medical History  Diagnosis Date  . GERD (gastroesophageal reflux disease)     Had endoscopy no history of Barrett's  . Arthritis   . Neuromuscular disorder   . Cataract 2007    bilateral cat ext     Family History  Problem Relation Age of Onset  . Neurodegenerative disease Mother     Franco Nones Drager died at 30  . Alcohol abuse    . Heart disease      History   Social History  . Marital Status: Married    Spouse Name: N/A    Number of Children: N/A  . Years of Education: N/A   Social History Main Topics  . Smoking status: Former Games developer  . Smokeless tobacco: None  . Alcohol Use: 4.2 oz/week    7 Glasses of wine per week  . Drug Use: Yes    Special: Hydrocodone  . Sexually Active: Yes   Other Topics Concern  . None   Social History Narrative   Retired Charity fundraiser    married   HH of 2    Regular exercise former smoker   [id number]    Outpatient Encounter Prescriptions as of 07/14/2012  Medication Sig Dispense Refill  . ascorbic acid (VITAMIN C) 1000 MG tablet Take 1,000  mg by mouth daily.      Marland Kitchen aspirin 81 MG chewable tablet Chew 81 mg by mouth daily.        . calcium citrate-vitamin D (CITRACAL+D) 315-200 MG-UNIT per tablet Take 1 tablet by mouth daily.        . celecoxib (CELEBREX) 200 MG capsule Take 200 mg by mouth daily.        Marland Kitchen estradiol (VIVELLE-DOT) 0.025 MG/24HR Place 1 patch onto the skin 2 (two) times a week.      . famotidine (PEPCID) 40 MG tablet Take 40 mg by mouth every morning.       Marland Kitchen HYDROcodone-ibuprofen (VICOPROFEN) 7.5-200 MG per tablet Take 1 tablet by mouth every 8 (eight) hours as needed. For pain      . Multiple Vitamin (MULTIVITAMIN) tablet Take 1 tablet by mouth daily.        . simvastatin (ZOCOR) 40 MG tablet Take 40 mg by mouth at bedtime.        . carbamazepine (TEGRETOL) 100 MG chewable tablet Chew 100 mg by mouth 3 (three) times daily.      . promethazine (PHENERGAN) 12.5 MG tablet Take 1 tablet (12.5 mg total) by mouth every 6 (six) hours as needed for nausea. Can take  25 mg if needed  20 tablet  0  . [DISCONTINUED] diazepam (VALIUM) 5 MG tablet Take 1 tablet (5 mg total) by mouth every 6 (six) hours as needed (Muscle spasm).  30 tablet  0  . [DISCONTINUED] gabapentin (NEURONTIN) 600 MG tablet Take 1 tablet (600 mg total) by mouth 4 (four) times daily.  120 tablet  6  . [DISCONTINUED] oxyCODONE-acetaminophen (ROXICET) 5-325 MG per tablet Take 1 tablet by mouth every 4 (four) hours as needed for pain.  40 tablet  0   No facility-administered encounter medications on file as of 07/14/2012.    EXAM:  BP 124/76  Pulse 78  Temp(Src) 98.9 F (37.2 C) (Oral)  Wt 151 lb (68.493 kg)  BMI 25.91 kg/m2  SpO2 98%  Body mass index is 25.91 kg/(m^2).  GENERAL: vitals reviewed and listed above, alert, oriented, appears well hydrated and in no acute distress  HEENT: atraumatic, conjunctiva  clear, no obvious abnormalities on inspection of external nose and ears OP : no lesion edema or exudate   NECK: no obvious masses on inspection  palpation no adenopathy  LUNGS: clear to auscultation bilaterally, no wheezes, rales or rhonchi, good air movement  CV: HRRR, no clubbing cyanosis or  peripheral edema nl cap refill  Abdomen soft no organomegaly guarding or rebound no masses are noted no CVA pain. Skin no acute color changes bleeding. MS: moves all extremities without noticeable focal  abnormality ambulatory walks with a cane to steady herself but is independent  PSYCH: pleasant and cooperative, no obvious depression or anxiety  ASSESSMENT AND PLAN:  Discussed the following assessment and plan:  Nausea alone - Plan: carbamazepine (TEGRETOL) 100 MG chewable tablet, POC Urinalysis Dipstick, Basic metabolic panel, CBC with Differential, Hepatic function panel, TSH, Urine culture, Urine culture  Urinary frequency - Plan: carbamazepine (TEGRETOL) 100 MG chewable tablet, POC Urinalysis Dipstick, Basic metabolic panel, CBC with Differential, Hepatic function panel, TSH, Urine culture, Urine culture  Neuropathy, peripheral - Plan: carbamazepine (TEGRETOL) 100 MG chewable tablet, POC Urinalysis Dipstick, Basic metabolic panel, CBC with Differential, Hepatic function panel, TSH  High risk medication use - Plan: carbamazepine (TEGRETOL) 100 MG chewable tablet, POC Urinalysis Dipstick, Basic metabolic panel, CBC with Differential, Hepatic function panel, TSH Expectant management caution with Phenergan low-dose contact them about labs. Followup if persistent and progressive without obvious cause. Supportive hydration. -Patient advised to return or notify health care team  if symptoms worsen or persist or new concerns arise.  Patient Instructions  This could be a stomach virus that runs its course and  Try to keep hydrated .   dont thibk its the tegretol after one dose  But agree with ou neurologist plan of going back on the gabapentin,  Can use phenergan  As needed with  Caution  Grogginess.   Will notify you  of labs when  available.    Neta Mends. Brieann Osinski M.D.

## 2012-07-14 NOTE — Patient Instructions (Signed)
This could be a stomach virus that runs its course and  Try to keep hydrated .   dont thibk its the tegretol after one dose  But agree with ou neurologist plan of going back on the gabapentin,  Can use phenergan  As needed with  Caution  Grogginess.   Will notify you  of labs when available.

## 2012-07-16 LAB — URINE CULTURE: Colony Count: 15000

## 2012-08-02 ENCOUNTER — Other Ambulatory Visit: Payer: Self-pay | Admitting: Family Medicine

## 2012-08-02 DIAGNOSIS — E871 Hypo-osmolality and hyponatremia: Secondary | ICD-10-CM

## 2012-08-07 ENCOUNTER — Other Ambulatory Visit (INDEPENDENT_AMBULATORY_CARE_PROVIDER_SITE_OTHER): Payer: Medicare Other

## 2012-08-07 ENCOUNTER — Other Ambulatory Visit: Payer: Medicare Other

## 2012-08-07 DIAGNOSIS — E871 Hypo-osmolality and hyponatremia: Secondary | ICD-10-CM

## 2012-08-07 LAB — BASIC METABOLIC PANEL
BUN: 9 mg/dL (ref 6–23)
CO2: 26 mEq/L (ref 19–32)
Calcium: 8.8 mg/dL (ref 8.4–10.5)
Chloride: 101 mEq/L (ref 96–112)
Creatinine, Ser: 0.6 mg/dL (ref 0.4–1.2)
GFR: 108.17 mL/min (ref >60.00)
Glucose, Bld: 93 mg/dL (ref 70–99)
Potassium: 4.6 mEq/L (ref 3.5–5.1)
Sodium: 134 mEq/L — ABNORMAL LOW (ref 135–145)

## 2012-09-22 ENCOUNTER — Ambulatory Visit (INDEPENDENT_AMBULATORY_CARE_PROVIDER_SITE_OTHER): Payer: Medicare Other | Admitting: Neurology

## 2012-09-22 ENCOUNTER — Encounter: Payer: Self-pay | Admitting: Neurology

## 2012-09-22 ENCOUNTER — Ambulatory Visit: Payer: Self-pay | Admitting: Neurology

## 2012-09-22 VITALS — BP 135/72 | HR 72 | Ht 63.0 in | Wt 148.0 lb

## 2012-09-22 DIAGNOSIS — G609 Hereditary and idiopathic neuropathy, unspecified: Secondary | ICD-10-CM | POA: Insufficient documentation

## 2012-09-22 MED ORDER — GABAPENTIN 600 MG PO TABS: 600.0000 mg | ORAL_TABLET | Freq: Four times a day (QID) | ORAL | Status: DC

## 2012-09-22 MED ORDER — HYDROCODONE-IBUPROFEN 7.5-200 MG PO TABS: 1.0000 | ORAL_TABLET | Freq: Three times a day (TID) | ORAL | Status: DC | PRN

## 2012-09-22 NOTE — Progress Notes (Signed)
HPI:  79year-old right-handed white married  female with a history of pain occurring in the toes for which he saw Dr. Milly Jakob 04/2010.She notes burning on the bottoms of her feet and her toes feel stiff.  She was a patient of Dr Sandria Manly,   MRI of the lumbar spine 12/25/2010 shows diffuse spondylitic changes and progression of degenerative changes at L3-4 and L4-5 and modest broad disc bulge at L5-S1. There is evidence of neuroforaminal  stenosis on the left at L4-5. She has pain in her feet and aching from her knees down. She has difficulty walking, but it  is better in the  mornings. The pain is better if she sits down and props up her legs. She is on hydrocodone once daily which causes constipation. She has to lie down because of fatigue later in the afternoon about 4:00 PM. She tries to do her exercises and work earlier in the morning. She does gardenening. Activity makes her pain worse. She denies symptoms in her hands.   She tried Lyrica which caused abdominal cramps,  Cymbalta which caused nausea. She had side effects from nortriptyline 20 mg per night. She is on gabapentin 600, 1-1/2  tablets 4 times per day and complains of memory loss on the medication and now questions any improvement in pain. She uses a cane, but not in her house.   She has no prior history of diabetes mellitus or family history of neuropathy. Glucose 87, hemoglobin A1c 5.7 06/29/2011. She denies postural dizziness and bowel or bladder symptomatology. Her pain is worse in the mornings. Her left leg aches when she stands on her feet  She has a history of injury to her left shin and  sharp, burning and stinging pain in her left leg at the site of the  injury extending up her leg. Dr. Renae Fickle  suspected a superficial peroneal nerve contusion or entrapment. When she is off her feet or sitting down it does not hurt.  She has no pain in the left leg at night when she lies down for 8 hours   It is made worse by walking and resolves in  approximately 1 hour after she sits down.  There is no change with cough, sneeze, or Valsalva maneuver. Her pain in  the feet and legs is  8/10,  She has no back pain radiating down her leg. She does have back pain.  She underwent an epidural steroid injection 10/2011 with improvement in her back pain.   She is independent in activities of daily living including driving a car, cooking,cleaning, shopping, and doing laundry. she believes gabapentin is no longer help. She is using hydrocodone acetaminophen 5/300. When she first gets out of bed in the morning she has shooting pains lasting seconds. The arch of her feet feel stiff and painful. She complains of loss of memory on gabapentin..   She underwent EMG/NCV 12/24/2011 showing progression of peripheral neuropathy versus a prior study 09/13/2011. There was also a  mild overlying acute L5 radiculopathy on the left. She underwent  surgical decompression at L4-5 and L5-S1 with bilateral foraminotomies and microdiscectomy on the left at L4-5 02/01/2012 Dr. Danielle Dess, Surgery helped her.  She still complains of pain at low back pain, waist line, going down her leg, at both leg.  She denies bowel/bladder incontinence.  UPDATE May 2014: Her symptoms has stable, she is tolerating current medications, including gabapentin 600 mg 4 times a day, Celexa 200 mg every day, hydrocodone 7.5/200  1-2 tablets  each day,  There was no significant side effect, she continued to have mild low back pain,  Review of Systems  Out of a complete 14 system review, the patient complains of only the following symptoms, and all other reviewed systems are negative.   Constitutional:   N/A Cardiovascular:  N/A Ear/Nose/Throat:  N/A Skin: N/A Eyes: N/A Respiratory: N/A Gastroitestinal: N/A    Hematology/Lymphatic:  N/A Endocrine:  N/A Musculoskeletal: Low back pain Allergy/Immunology: N/A Neurological: bilateral feet paresthesia Psychiatric:    N/A      Physical Exam   General:  Well developed  white female Neurologic Exam  Mental Status: Alert and oriented to time, place, and person.  Follows one, two and three step commands. Cranial Nerves: Visual fields are full to count fingers examination.  Discs flat.  Status post cataract surgery bilaterally. Extraocular movements full.    Hearing intact.  Air conduction greater than bone conduction.  No facial asymmetry.  Tongue midline, uvula midline, and gags present.  Sternocleidomastoid and trapezius testing normal. Motor: 5/5 strength proximally and distally in the upper and lower extremities except weakness in the intrinsic muscles of both feet  No evidence of proximal, pronator, or distal drift.  No focal atrophy and no fasciculations seen. Decreased strength in the intrinsic muscles of the toes. Hammertoes. Sensory: Decreased vibration,touch, joint position, and pinprick in the lower extremities. No focal decreased sensation in the left L5 dermatome. Joint position is worse in the right foot then the left. Decreased 2-point discrimination in both hands.  Coordination:  Outstretched hand and arm tremor.  Intact finger-to-nose, heel-to-shin, and rapid alternating movements.  Gait and Station: Can toe walk, heel walk, but cannot tandem gait. She leans forward, brace her lumbar paraspinal muscles. Reflexes:   DTRs 2+ and equal except absent ankle reflexes.  Plantar responses downgoing.  Assessment and plan:  79 years old Caucasian female, with past medical history of peripheral neuropathy, lumbar stenosis, radiculopathy, status post decompression at L4-5, L5-S1 October 2013,  1 continue current medications, 2. I have suggested physical therapy, she does not want to have it,.  moderate exercise, back stretching exercise, 3. RTC in one year,

## 2012-09-24 ENCOUNTER — Encounter: Payer: Self-pay | Admitting: Neurology

## 2012-10-05 DIAGNOSIS — M47817 Spondylosis without myelopathy or radiculopathy, lumbosacral region: Secondary | ICD-10-CM | POA: Diagnosis not present

## 2012-10-05 DIAGNOSIS — IMO0002 Reserved for concepts with insufficient information to code with codable children: Secondary | ICD-10-CM | POA: Diagnosis not present

## 2012-10-18 DIAGNOSIS — L821 Other seborrheic keratosis: Secondary | ICD-10-CM | POA: Diagnosis not present

## 2012-10-18 DIAGNOSIS — L570 Actinic keratosis: Secondary | ICD-10-CM | POA: Diagnosis not present

## 2012-10-18 DIAGNOSIS — L819 Disorder of pigmentation, unspecified: Secondary | ICD-10-CM | POA: Diagnosis not present

## 2012-10-18 DIAGNOSIS — Z85828 Personal history of other malignant neoplasm of skin: Secondary | ICD-10-CM | POA: Diagnosis not present

## 2012-11-20 DIAGNOSIS — M461 Sacroiliitis, not elsewhere classified: Secondary | ICD-10-CM | POA: Diagnosis not present

## 2013-01-01 DIAGNOSIS — IMO0002 Reserved for concepts with insufficient information to code with codable children: Secondary | ICD-10-CM | POA: Diagnosis not present

## 2013-01-08 ENCOUNTER — Other Ambulatory Visit: Payer: Self-pay

## 2013-01-08 DIAGNOSIS — Z1231 Encounter for screening mammogram for malignant neoplasm of breast: Secondary | ICD-10-CM

## 2013-01-12 DIAGNOSIS — Z23 Encounter for immunization: Secondary | ICD-10-CM | POA: Diagnosis not present

## 2013-02-19 ENCOUNTER — Ambulatory Visit
Admission: RE | Admit: 2013-02-19 | Discharge: 2013-02-19 | Disposition: A | Discharge status: Home / Self Care | Payer: Medicare Other | Source: Ambulatory Visit

## 2013-02-19 DIAGNOSIS — Z1231 Encounter for screening mammogram for malignant neoplasm of breast: Secondary | ICD-10-CM

## 2013-03-05 DIAGNOSIS — Z23 Encounter for immunization: Secondary | ICD-10-CM | POA: Diagnosis not present

## 2013-03-29 ENCOUNTER — Other Ambulatory Visit: Payer: Self-pay | Admitting: Neurology

## 2013-03-29 MED ORDER — HYDROCODONE-IBUPROFEN 7.5-200 MG PO TABS: 1.0000 | ORAL_TABLET | Freq: Three times a day (TID) | ORAL | Status: DC | PRN

## 2013-03-30 NOTE — Telephone Encounter (Signed)
Called patient to inform her that her prescription was ready to be picked up and if she has any other problems, questions or concerns to call the office. Patient verbalized understanding.

## 2013-05-04 DIAGNOSIS — E785 Hyperlipidemia, unspecified: Secondary | ICD-10-CM | POA: Diagnosis not present

## 2013-05-04 DIAGNOSIS — Z0000 Encounter for general adult medical examination without abnormal findings: Secondary | ICD-10-CM | POA: Diagnosis not present

## 2013-05-04 DIAGNOSIS — Z79890 Hormone replacement therapy (postmenopausal): Secondary | ICD-10-CM | POA: Diagnosis not present

## 2013-05-04 DIAGNOSIS — Z01419 Encounter for gynecological examination (general) (routine) without abnormal findings: Secondary | ICD-10-CM | POA: Diagnosis not present

## 2013-05-04 DIAGNOSIS — R82998 Other abnormal findings in urine: Secondary | ICD-10-CM | POA: Diagnosis not present

## 2013-05-04 DIAGNOSIS — N8189 Other female genital prolapse: Secondary | ICD-10-CM | POA: Diagnosis not present

## 2013-05-21 DIAGNOSIS — Z961 Presence of intraocular lens: Secondary | ICD-10-CM | POA: Diagnosis not present

## 2013-06-14 ENCOUNTER — Other Ambulatory Visit: Payer: Self-pay | Admitting: Neurology

## 2013-06-14 NOTE — Telephone Encounter (Signed)
Pt called in requesting that her hydrocodone prescription be refilled.  Please call when it is ready for pick up.  Thank you.

## 2013-06-15 MED ORDER — HYDROCODONE-IBUPROFEN 7.5-200 MG PO TABS: 1.0000 | ORAL_TABLET | Freq: Three times a day (TID) | ORAL | Status: DC | PRN

## 2013-06-18 ENCOUNTER — Telehealth: Payer: Self-pay | Admitting: Neurology

## 2013-06-18 NOTE — Telephone Encounter (Signed)
Called patient's son Ronalee Belts to inform him that the patient Rx was ready to be picked up at the front desk and if the patient has any other problems, questions or concerns to call the office. Patient's son verbalized understanding.

## 2013-06-18 NOTE — Telephone Encounter (Signed)
[name] called to req. of picking up pt's prescription HYDROcodone-ibuprofen (VICOPROFEN) 7.5-200 MG per tablet. They stated they called earlier but the prescription is not up front. They are at [facility], pt's husband passed away and while they were in town they would like to pick this up. Please call son Ronalee Belts at [phone] when this is ready for pick-up. Thanks

## 2013-06-18 NOTE — Telephone Encounter (Signed)
Called patient's son to inform him that the patient's Rx was ready to be picked up at the front desk and if the patient has any other problems, questions or concerns to call the office. Patient's son verbalized understanding.

## 2013-06-18 NOTE — Telephone Encounter (Signed)
[name] authorized this on Friday.  I will forward message to [facility] to follow up on request.  It may be pending a signature since written Rx is required.

## 2013-08-13 ENCOUNTER — Other Ambulatory Visit: Payer: Self-pay | Admitting: Neurology

## 2013-08-13 NOTE — Telephone Encounter (Signed)
Pt called to request a written refill on her HYDROcodone-ibuprofen (VICOPROFEN) 7.5-200 MG per tablet please call pt when this is ready for p/u. Thanks

## 2013-08-14 ENCOUNTER — Telehealth: Payer: Self-pay | Admitting: *Deleted

## 2013-08-14 MED ORDER — HYDROCODONE-IBUPROFEN 7.5-200 MG PO TABS: 1.0000 | ORAL_TABLET | Freq: Three times a day (TID) | ORAL | Status: DC | PRN

## 2013-08-14 NOTE — Telephone Encounter (Signed)
I called patient back earlier today and advised Rx was pending MD signature.  (Please see previous note.  I spoke with patient at 8:46 this morning).  I called back again.  Patient is aware we will call her when Rx is ready for pick up.

## 2013-08-14 NOTE — Telephone Encounter (Signed)
Patient calling to check on status of her hydrocodone refill, states she has not heard anything back yet. Please return call, patient states it is okay to leave detailed voicemail message.

## 2013-08-14 NOTE — Telephone Encounter (Signed)
Pt has called 4x regarding her refillHYDROcodone-ibuprofen (VICOPROFEN) 7.5-200 MG per tablet.  She's very upset and would like a return call asap...thanks

## 2013-08-15 ENCOUNTER — Telehealth: Payer: Self-pay | Admitting: Neurology

## 2013-08-15 NOTE — Telephone Encounter (Signed)
Called pt to inform her that her Rx was ready to be picked up at the front desk and if she has any other problems, questions or concerns to call the office. Pt verbalized understanding. °

## 2013-08-15 NOTE — Telephone Encounter (Signed)
Susan Ford has contacted the patient regarding Rx being ready this morning.

## 2013-08-15 NOTE — Telephone Encounter (Signed)
Pt has already been called to pick up her Rx and has been scheduled for f/u appt.

## 2013-08-15 NOTE — Telephone Encounter (Signed)
Pt has called back again demanding her medication and I advised her Janett Billow will be calling pt when ready for p/u. I did advise pt that she needed to schedule an annual, scheduled an apt with pt. Please call pt for p/u. Thanks

## 2013-09-03 ENCOUNTER — Other Ambulatory Visit: Payer: Self-pay | Admitting: Neurology

## 2013-09-07 ENCOUNTER — Other Ambulatory Visit: Payer: Self-pay | Admitting: Neurology

## 2013-10-04 ENCOUNTER — Telehealth: Payer: Self-pay | Admitting: Neurology

## 2013-10-04 NOTE — Telephone Encounter (Signed)
Called pt to explain to her that Dr. Krista Blue has not prescribed this medication for the pt and that Dr. Regis Bill has. I advised the pt to call her PCP to request a refill of the Celebrex 200 mg. I also advised the pt that if she has any other problems, questions or concerns to call the office. Pt verbalized understanding. FYI

## 2013-10-04 NOTE — Telephone Encounter (Signed)
Patient requesting Dr. Krista Blue prescribe her Celebrex 200 mg once daily (generic is okay) - Please call to advise.

## 2013-10-05 ENCOUNTER — Telehealth: Payer: Self-pay | Admitting: Family Medicine

## 2013-10-05 NOTE — Telephone Encounter (Signed)
Patient called and left a message on my machine for me to return her call.

## 2013-10-05 NOTE — Telephone Encounter (Signed)
Spoke to the pt.  She would like for Dr. Regis Bill to take over her Cymbalta prescription origanally prescribed by Dr. Ubaldo Glassing.  Advised the pt come in and talk to Dr. Regis Bill.  She has not been seen in over 1 year.  Transferred to scheduling to make a 30 minute appt.

## 2013-10-12 ENCOUNTER — Other Ambulatory Visit: Payer: Self-pay | Admitting: Neurology

## 2013-10-12 MED ORDER — HYDROCODONE-IBUPROFEN 7.5-200 MG PO TABS: 1.0000 | ORAL_TABLET | Freq: Three times a day (TID) | ORAL | Status: DC | PRN

## 2013-10-12 NOTE — Telephone Encounter (Signed)
Request forwarded to provider for approval  

## 2013-10-12 NOTE — Telephone Encounter (Signed)
Pt called to request a written prescription for her HYDROcodone-ibuprofen (VICOPROFEN) 7.5-200 MG per tablet, please call pt when ready for pickup. Thanks

## 2013-10-16 ENCOUNTER — Telehealth: Payer: Self-pay | Admitting: Neurology

## 2013-10-16 NOTE — Telephone Encounter (Signed)
Called pt to inform her that her Rx was ready to be picked up at the front desk and if she has any other problems, questions or concerns to call the office. Pt verbalized understanding. °

## 2013-10-16 NOTE — Telephone Encounter (Signed)
Pt called back very upset, states she called last Friday and this is Tuesday and wants to know when is her medicine going to be approved? I did advise pt this has been forwarded to Dr. Krista Blue for approval. Please call pt back when ready for pick up. Thanks

## 2013-10-23 ENCOUNTER — Encounter: Payer: Self-pay | Admitting: Internal Medicine

## 2013-10-23 ENCOUNTER — Ambulatory Visit (INDEPENDENT_AMBULATORY_CARE_PROVIDER_SITE_OTHER): Payer: Medicare Other | Admitting: Internal Medicine

## 2013-10-23 VITALS — BP 120/72 | HR 78 | Temp 98.4°F | Ht 63.0 in | Wt 142.0 lb

## 2013-10-23 DIAGNOSIS — Z23 Encounter for immunization: Secondary | ICD-10-CM

## 2013-10-23 DIAGNOSIS — E785 Hyperlipidemia, unspecified: Secondary | ICD-10-CM | POA: Insufficient documentation

## 2013-10-23 DIAGNOSIS — Z79899 Other long term (current) drug therapy: Secondary | ICD-10-CM | POA: Diagnosis not present

## 2013-10-23 DIAGNOSIS — Z634 Disappearance and death of family member: Secondary | ICD-10-CM

## 2013-10-23 DIAGNOSIS — Z789 Other specified health status: Secondary | ICD-10-CM

## 2013-10-23 DIAGNOSIS — G629 Polyneuropathy, unspecified: Secondary | ICD-10-CM

## 2013-10-23 DIAGNOSIS — M1990 Unspecified osteoarthritis, unspecified site: Secondary | ICD-10-CM

## 2013-10-23 DIAGNOSIS — M129 Arthropathy, unspecified: Secondary | ICD-10-CM | POA: Diagnosis not present

## 2013-10-23 DIAGNOSIS — G609 Hereditary and idiopathic neuropathy, unspecified: Secondary | ICD-10-CM

## 2013-10-23 HISTORY — DX: Hyperlipidemia, unspecified: E78.5

## 2013-10-23 LAB — HEPATIC FUNCTION PANEL
ALT: 14 U/L (ref 0–35)
AST: 22 U/L (ref 0–37)
Albumin: 4.1 g/dL (ref 3.5–5.2)
Alkaline Phosphatase: 54 U/L (ref 39–117)
Bilirubin, Direct: 0 mg/dL (ref 0.0–0.3)
Total Bilirubin: 0.6 mg/dL (ref 0.2–1.2)
Total Protein: 7.1 g/dL (ref 6.0–8.3)

## 2013-10-23 LAB — BASIC METABOLIC PANEL
BUN: 13 mg/dL (ref 6–23)
CO2: 24 mEq/L (ref 19–32)
Calcium: 9.2 mg/dL (ref 8.4–10.5)
Chloride: 100 mEq/L (ref 96–112)
Creatinine, Ser: 0.7 mg/dL (ref 0.4–1.2)
GFR: 87.97 mL/min (ref >60.00)
Glucose, Bld: 109 mg/dL — ABNORMAL HIGH (ref 70–99)
Potassium: 4.8 mEq/L (ref 3.5–5.1)
Sodium: 133 mEq/L — ABNORMAL LOW (ref 135–145)

## 2013-10-23 LAB — CBC WITH DIFFERENTIAL/PLATELET
Basophils Absolute: 0.1 10*3/uL (ref 0.0–0.1)
Basophils Relative: 1.3 % (ref 0.0–3.0)
Eosinophils Absolute: 0.4 10*3/uL (ref 0.0–0.7)
Eosinophils Relative: 5.1 % — ABNORMAL HIGH (ref 0.0–5.0)
HCT: 39.2 % (ref 36.0–46.0)
Hemoglobin: 13.4 g/dL (ref 12.0–15.0)
Lymphocytes Relative: 25.1 % (ref 12.0–46.0)
Lymphs Abs: 2.2 10*3/uL (ref 0.7–4.0)
MCHC: 34.1 g/dL (ref 30.0–36.0)
MCV: 91.2 fl (ref 78.0–100.0)
Monocytes Absolute: 0.5 10*3/uL (ref 0.1–1.0)
Monocytes Relative: 6.1 % (ref 3.0–12.0)
Neutro Abs: 5.4 10*3/uL (ref 1.4–7.7)
Neutrophils Relative %: 62.4 % (ref 43.0–77.0)
Platelets: 245 10*3/uL (ref 150.0–400.0)
RBC: 4.3 Mil/uL (ref 3.87–5.11)
RDW: 13.2 % (ref 11.5–15.5)
WBC: 8.6 10*3/uL (ref 4.0–10.5)

## 2013-10-23 LAB — LIPID PANEL
Cholesterol: 179 mg/dL (ref 0–200)
HDL: 70.1 mg/dL (ref >39.00)
LDL Cholesterol: 68 mg/dL (ref 0–99)
NonHDL: 108.9
Total CHOL/HDL Ratio: 3
Triglycerides: 203 mg/dL — ABNORMAL HIGH (ref 0.0–149.0)
VLDL: 40.6 mg/dL — ABNORMAL HIGH (ref 0.0–40.0)

## 2013-10-23 LAB — TSH: TSH: 0.76 u[IU]/mL (ref 0.35–4.50)

## 2013-10-23 MED ORDER — CELECOXIB 200 MG PO CAPS: 200.0000 mg | ORAL_CAPSULE | Freq: Every day | ORAL | Status: DC | PRN

## 2013-10-23 NOTE — Progress Notes (Signed)
Chief Complaint  Patient presents with  . Arthritis    medication management    HPI: Patient comes in today for  Visit   Last seen   40 11  80 years old Caucasian female, with past medical history of peripheral neuropathy, lumbar stenosis, radiculopathy, status post decompression at L4-5, L5-S1 October 2013,  Seen [name] in May and told to have pcp prescribe this medication.   Carried dx of peripheral neuropathy  And has been on celebrex  from her gyne dr Valorie Roosevelt has since retired . arthritis and hands and back .  Taking once a day 200 .mg  X 6 years.  No known side effects  Husband  Died in [place] and dealing with grief.   She was also caretaker.  Sons and daughter help belong to [employer].  On lipid med per gyne? On HRT per gyne   Health Maintenance  Topic Date Due  . Colonoscopy  06/29/1981  . Zostavax  06/30/1991  . Pneumococcal Polysaccharide Vaccine Age 7 And Over  06/29/1996  . Influenza Vaccine  11/24/2013  . Tetanus/tdap  11/07/2021   Health Maintenance Review ROS:  GEN/ HEENT: No fever, significant weight changes sweats headaches vision problems hearing changes, CV/ PULM; No chest pain shortness of breath cough, syncope,edema  change in exercise tolerance. GI /GU: No adominal pain, vomiting, change in bowel habits. No blood in the stool. No significant GU symptoms. SKIN/HEME: ,no acute skin rashes suspicious lesions or bleeding. No lymphadenopathy, nodules, masses.  NEURO/ PSYCH:  No neurologic signs such as weakness numbness. No depression anxiety. IMM/ Allergy: No unusual infections.  Allergy .   REST of 12 system review negative except as per HPI   Past Medical History  Diagnosis Date  . GERD (gastroesophageal reflux disease)     Had endoscopy no history of Barrett's  . Arthritis   . Cataract 2007    bilateral cat ext   . Unspecified hereditary and idiopathic peripheral neuropathy     Family History  Problem Relation Age of Onset  .  Neurodegenerative disease Mother     Mikey Bussing Drager died at 72  . Alcohol abuse    . Heart disease      History   Social History  . Marital Status: Married    Spouse Name: Gwyndolyn Saxon    Number of Children: N/A  . Years of Education: N/A   Occupational History  . Retired     Therapist, sports   Social History Main Topics  . Smoking status: Never Smoker   . Smokeless tobacco: None  . Alcohol Use: 4.2 oz/week    7 Glasses of wine per week  . Drug Use: No  . Sexual Activity: Yes   Other Topics Concern  . None   Social History Narrative   Retired Therapist, sports    married   Maria Antonia of 2    Regular exercise former smoker   [id number]          Outpatient Encounter Prescriptions as of 10/23/2013  Medication Sig  . ascorbic acid (VITAMIN C) 1000 MG tablet Take 1,000 mg by mouth daily.  Marland Kitchen aspirin 81 MG chewable tablet Chew 81 mg by mouth daily.    . calcium citrate-vitamin D (CITRACAL+D) 315-200 MG-UNIT per tablet Take 1 tablet by mouth daily.    . celecoxib (CELEBREX) 200 MG capsule Take 1 capsule (200 mg total) by mouth daily as needed (arthritis pain).  Marland Kitchen estradiol (VIVELLE-DOT) 0.025 MG/24HR Place 1 patch onto the  skin once a week.   . famotidine (PEPCID) 40 MG tablet Take 40 mg by mouth every morning.   . gabapentin (NEURONTIN) 600 MG tablet Take 1 tablet (600 mg total) by mouth 4 (four) times daily.  Marland Kitchen HYDROcodone-ibuprofen (VICOPROFEN) 7.5-200 MG per tablet Take 1 tablet by mouth every 8 (eight) hours as needed. For pain  . Multiple Vitamin (MULTIVITAMIN) tablet Take 1 tablet by mouth daily.    . simvastatin (ZOCOR) 20 MG tablet Take 20 mg by mouth at bedtime.  . [DISCONTINUED] celecoxib (CELEBREX) 200 MG capsule Take 200 mg by mouth daily.    . [DISCONTINUED] gabapentin (NEURONTIN) 600 MG tablet TAKE 1 TABLET (600 MG TOTAL) BY MOUTH 4 (FOUR) TIMES DAILY.  . [DISCONTINUED] gabapentin (NEURONTIN) 600 MG tablet TAKE 1 TABLET (600 MG TOTAL) BY MOUTH 4 (FOUR) TIMES DAILY.  . [DISCONTINUED] simvastatin (ZOCOR) 40 MG  tablet Take 40 mg by mouth at bedtime.      EXAM:  BP 120/72  Pulse 78  Temp(Src) 98.4 F (36.9 C) (Oral)  Ht 5\' 3"  (1.6 m)  Wt 142 lb (64.411 kg)  BMI 25.16 kg/m2  Body mass index is 25.16 kg/(m^2).  Physical Exam: Vital signs reviewed SHF:WYOV is a well-developed well-nourished alert cooperative    who appearsr stated age in no acute distress.  HEENT: normocephalic atraumatic , Eyes: PERRL EOM's full, conjunctiva clear, Nares: paten,t no deformity discharge or tenderness., Ears: no deformity EAC's clear TMs with normal landmarksNECK: supple without masses, thyromegaly or bruits. CHEST/PULM:  Clear to auscultation and percussion breath sounds equal no wheeze , rales or rhonchi.CV: PMI is nondisplaced, S1 S2 no gallops, murmurs, rubs. Peripheral pulses are presentnoo JVD .  ABDOMEN: Bowel sounds normal nontender  No guard or rebound, no hepato splenomegal no CVA tenderness.  Extremtities:  No clubbing cyanosis or edema, no acute joint swelling or redness no focal atrophy  OA changes  NEURO:  Oriented x3, cranial nerves 3-12 appear to be intact, no obvious focal weakness,gait within normal limit scan get u on table carefully  SKIN: No acute rashes normal turgor, color, no bruising or petechiae. PSYCH: Oriented, good eye contact, no obvious depression anxiety, cognition and judgment appear normal.  Appropriately  Affect when discussing husbands illness and death ( heart) LN: no cervical  adenopathy    ASSESSMENT AND PLAN:  Discussed the following assessment and plan:  Arthritis - Plan: Basic metabolic panel, TSH, CBC with Differential, Lipid panel, Hepatic function panel  Neuropathy, peripheral - Plan: Basic metabolic panel, TSH, CBC with Differential, Lipid panel, Hepatic function panel  High risk medication use - risk bewnefot  at thist time benefot more than risk and monitor renal functiontec.  - Plan: Basic metabolic panel, TSH, CBC with Differential, Lipid panel, Hepatic  function panel  DEGENERATIVE JOINT DISEASE - Plan: Basic metabolic panel, TSH, CBC with Differential, Lipid panel, Hepatic function panel  Hyperlipidemia - Plan: Basic metabolic panel, TSH, CBC with Differential, Lipid panel, Hepatic function panel  Need for prophylactic vaccination against Streptococcus pneumoniae (pneumococcus) - Plan: Pneumococcal conjugate vaccine 13-valent  Recent bereavement  Patient Care Team: Burnis Medin, MD as PCP - General Patient Instructions  celebrex has  Risk of bad effects   To kidneys but at this time continue if benefit of activities of you daily life are helpful. absolutely today  ROV in 6 months or as needed .   Standley Brooking. Cleatis Fandrich M.D.   Lab Results  Component Value Date   WBC 8.6 10/23/2013  HGB 13.4 10/23/2013   HCT 39.2 10/23/2013   PLT 245.0 10/23/2013   GLUCOSE 109* 10/23/2013   CHOL 179 10/23/2013   TRIG 203.0* 10/23/2013   HDL 70.10 10/23/2013   LDLDIRECT 153 02/12/2009   LDLCALC 68 10/23/2013   ALT 14 10/23/2013   AST 22 10/23/2013   NA 133* 10/23/2013   K 4.8 10/23/2013   CL 100 10/23/2013   CREATININE 0.7 10/23/2013   BUN 13 10/23/2013   CO2 24 10/23/2013   TSH 0.76 10/23/2013   Total visit 74mins > 50% spent counseling and coordinating care   Medication grief and loss etc   Expectant management.

## 2013-10-23 NOTE — Progress Notes (Signed)
Pre visit review using our clinic review tool, if applicable. No additional management support is needed unless otherwise documented below in the visit note. 

## 2013-10-23 NOTE — Patient Instructions (Addendum)
celebrex has  Risk of bad effects   To kidneys but at this time continue if benefit of activities of you daily life are helpful. absolutely today  ROV in 6 months or as needed .

## 2013-10-25 DIAGNOSIS — M1990 Unspecified osteoarthritis, unspecified site: Secondary | ICD-10-CM | POA: Insufficient documentation

## 2013-10-25 DIAGNOSIS — Z634 Disappearance and death of family member: Secondary | ICD-10-CM | POA: Insufficient documentation

## 2013-10-31 ENCOUNTER — Other Ambulatory Visit: Payer: Self-pay | Admitting: Dermatology

## 2013-10-31 DIAGNOSIS — D239 Other benign neoplasm of skin, unspecified: Secondary | ICD-10-CM | POA: Diagnosis not present

## 2013-10-31 DIAGNOSIS — Z85828 Personal history of other malignant neoplasm of skin: Secondary | ICD-10-CM | POA: Diagnosis not present

## 2013-10-31 DIAGNOSIS — L821 Other seborrheic keratosis: Secondary | ICD-10-CM | POA: Diagnosis not present

## 2013-10-31 DIAGNOSIS — L820 Inflamed seborrheic keratosis: Secondary | ICD-10-CM | POA: Diagnosis not present

## 2013-10-31 DIAGNOSIS — C44519 Basal cell carcinoma of skin of other part of trunk: Secondary | ICD-10-CM | POA: Diagnosis not present

## 2013-11-07 DIAGNOSIS — K6289 Other specified diseases of anus and rectum: Secondary | ICD-10-CM | POA: Diagnosis not present

## 2013-11-07 DIAGNOSIS — K602 Anal fissure, unspecified: Secondary | ICD-10-CM | POA: Diagnosis not present

## 2013-12-03 ENCOUNTER — Other Ambulatory Visit: Payer: Self-pay | Admitting: Neurology

## 2013-12-03 MED ORDER — HYDROCODONE-IBUPROFEN 7.5-200 MG PO TABS
1.0000 | ORAL_TABLET | Freq: Three times a day (TID) | ORAL | Status: DC | PRN
Start: 2013-12-03 — End: 2014-01-04

## 2013-12-03 NOTE — Telephone Encounter (Signed)
Called pt to inform her that her Rx was ready to be picked up at the front desk and if she has any other problems, questions or concerns to call the office. Pt verbalized understanding. °

## 2013-12-03 NOTE — Telephone Encounter (Signed)
Patient has an appt scheduled next month.  Request forwarded to provider for approval.

## 2013-12-03 NOTE — Telephone Encounter (Signed)
Patient requesting Rx refill for HYDROcodone-ibuprofen (VICOPROFEN) 7.5-200 MG per tablet.  Leaving for vacation Thursday and would like to pick up before Thursday.  Please call anytime when ready for pick up.  Thanks

## 2013-12-12 ENCOUNTER — Ambulatory Visit: Payer: PRIVATE HEALTH INSURANCE | Admitting: Neurology

## 2013-12-19 DIAGNOSIS — K5900 Constipation, unspecified: Secondary | ICD-10-CM | POA: Diagnosis not present

## 2014-01-04 ENCOUNTER — Ambulatory Visit (INDEPENDENT_AMBULATORY_CARE_PROVIDER_SITE_OTHER): Payer: Medicare Other | Admitting: Neurology

## 2014-01-04 ENCOUNTER — Encounter (INDEPENDENT_AMBULATORY_CARE_PROVIDER_SITE_OTHER): Payer: Self-pay

## 2014-01-04 ENCOUNTER — Encounter: Payer: Self-pay | Admitting: Neurology

## 2014-01-04 VITALS — BP 121/75 | HR 74 | Ht 63.0 in | Wt 136.0 lb

## 2014-01-04 DIAGNOSIS — E785 Hyperlipidemia, unspecified: Secondary | ICD-10-CM

## 2014-01-04 DIAGNOSIS — G589 Mononeuropathy, unspecified: Secondary | ICD-10-CM | POA: Diagnosis not present

## 2014-01-04 DIAGNOSIS — G629 Polyneuropathy, unspecified: Secondary | ICD-10-CM

## 2014-01-04 DIAGNOSIS — M48061 Spinal stenosis, lumbar region without neurogenic claudication: Secondary | ICD-10-CM

## 2014-01-04 MED ORDER — HYDROCODONE-IBUPROFEN 7.5-200 MG PO TABS: 1.0000 | ORAL_TABLET | Freq: Three times a day (TID) | ORAL | Status: DC | PRN

## 2014-01-04 MED ORDER — GABAPENTIN 600 MG PO TABS: 600.0000 mg | ORAL_TABLET | Freq: Four times a day (QID) | ORAL | Status: DC

## 2014-01-04 NOTE — Progress Notes (Signed)
HPI:  80 year-old right-handed white female, come in for yearly followup to refill her medications. Hydrocodone/ibuprofen 7.5/200 every 8 hours as needed, she gets 60 tablets each month, Neurontin 600 mg 4 times a day, she also taking Celebrex 200 mg daily, getting prescription from her primary care Dr. Regis Bill.  She was a patient of Dr. Erling Cruz, last visit was May 2014   She had a history of lumbar decompression surgery, presenting with chronic low back pain, mild gait difficulty due to pain.  MRI of the lumbar spine 12/25/2010 shows diffuse spondylitic changes and progression of degenerative changes at L3-4 and L4-5 and modest broad disc bulge at L5-S1. There is evidence of neuroforaminal  stenosis on the left at L4-5. She has pain in her feet and aching from her knees down.  EMG/NCV 12/24/2011 showing progression of peripheral neuropathy versus a prior study 09/13/2011. There was also a  mild overlying acute L5 radiculopathy on the left. She underwent  surgical decompression at L4-5 and L5-S1 with bilateral foraminotomies and microdiscectomy on the left at L4-5 02/01/2012 Dr. Ellene Route, Surgery helped her.  She still complains of pain at low back pain, waist line, going down her leg, at both leg.  She denies bowel/bladder incontinence   She has difficulty walking, because of low back pain, worst pain in the morning, she usually take her hydrocodone early morning, then napping for a while, then she can function again.  Previously she has tried Lyrica which caused abdominal cramps,  Cymbalta, caused nausea. She had side effects from nortriptyline 20 mg per night. She is on gabapentin 600, 1-1/2  tablets 4 times per day and complains of memory loss on the medication and now questions any improvement in pain. She uses a cane, but not in her house.   She underwent an epidural steroid injection 10/2011 with improvement in her back pain.   She is independent in activities of daily living including driving a car,  cooking,cleaning, shopping, and doing laundry.     Review of Systems  Out of a complete 14 system review, the patient complains of only the following symptoms, and all other reviewed systems are negative.   Chronic low back pain      Physical Exam  General:  Well developed  white female Neurologic Exam  Mental Status: Alert and oriented to time, place, and person.  Follows one, two and three step commands. Cranial Nerves: Visual fields are full to count fingers examination.  Discs flat.  Status post cataract surgery bilaterally. Extraocular movements full.    Hearing intact.  Air conduction greater than bone conduction.  No facial asymmetry.  Tongue midline, uvula midline, and gags present.  Sternocleidomastoid and trapezius testing normal. Motor: 5/5 strength proximally and distally in the upper and lower extremities except weakness in the intrinsic muscles of both feet  No evidence of proximal, pronator, or distal drift.  No focal atrophy and no fasciculations seen. Decreased strength in the intrinsic muscles of the toes. Hammertoes. Sensory: Decreased vibration,touch, joint position, and pinprick in the lower extremities. No focal decreased sensation in the left L5 dermatome. Joint position is worse in the right foot then the left. Decreased 2-point discrimination in both hands.  Coordination:  Outstretched hand and arm tremor.  Intact finger-to-nose, heel-to-shin, and rapid alternating movements.  Gait and Station: Can toe walk, heel walk, but cannot tandem gait. She leans forward, brace her lumbar paraspinal muscles. Reflexes:   DTRs 2+ and equal except absent ankle reflexes.  Plantar responses  downgoing.  Assessment and plan:  80 years old Caucasian female, with past medical history of peripheral neuropathy, lumbar stenosis, radiculopathy, status post decompression at L4-5, L5-S1 October 2013,  1 continue current medications, refill her hydrocodone/ibuprofen 7.5/200 mg 60 tablets each  month, gabapentin 600mg  130 tablets each month 2. I have suggested physical therapy, she does not want to have it,.  moderate exercise, back stretching exercise, 3. Return to clinic in one year

## 2014-01-10 DIAGNOSIS — Z23 Encounter for immunization: Secondary | ICD-10-CM | POA: Diagnosis not present

## 2014-01-17 ENCOUNTER — Other Ambulatory Visit: Payer: Self-pay

## 2014-01-17 DIAGNOSIS — Z1231 Encounter for screening mammogram for malignant neoplasm of breast: Secondary | ICD-10-CM

## 2014-02-11 ENCOUNTER — Other Ambulatory Visit: Payer: Self-pay | Admitting: Neurology

## 2014-02-11 NOTE — Telephone Encounter (Signed)
Patient requesting Rx refill for HYDROcodone-ibuprofen (VICOPROFEN) 7.5-200 MG per tablet.  Please call when ready for pick up.

## 2014-02-11 NOTE — Telephone Encounter (Signed)
Request forwarded to provider for approval  

## 2014-02-12 MED ORDER — HYDROCODONE-IBUPROFEN 7.5-200 MG PO TABS: 1.0000 | ORAL_TABLET | Freq: Three times a day (TID) | ORAL | Status: DC | PRN

## 2014-02-14 NOTE — Telephone Encounter (Signed)
I called the patient to let her know that Dr. Krista Blue is signing her Rx and that it is ready for her to pick up.  The patient states that she will be in the office first thing tomorrow morning to pick up her Rx.

## 2014-02-14 NOTE — Telephone Encounter (Signed)
Patient calling to check on the status of this message, please return call and advise, states that she only has one pill left for today.

## 2014-02-14 NOTE — Telephone Encounter (Signed)
I called back.  Got no answer.  Left message.  We will be sure to contact her as soon as written Rx has been signed and is ready for pick up.

## 2014-02-20 ENCOUNTER — Encounter (HOSPITAL_COMMUNITY): Payer: Self-pay | Admitting: Emergency Medicine

## 2014-02-20 ENCOUNTER — Telehealth: Payer: Self-pay | Admitting: Internal Medicine

## 2014-02-20 DIAGNOSIS — K8042 Calculus of bile duct with acute cholecystitis without obstruction: Secondary | ICD-10-CM | POA: Diagnosis not present

## 2014-02-20 DIAGNOSIS — I4891 Unspecified atrial fibrillation: Secondary | ICD-10-CM | POA: Diagnosis present

## 2014-02-20 DIAGNOSIS — M1990 Unspecified osteoarthritis, unspecified site: Secondary | ICD-10-CM | POA: Diagnosis present

## 2014-02-20 DIAGNOSIS — R1110 Vomiting, unspecified: Secondary | ICD-10-CM | POA: Diagnosis not present

## 2014-02-20 DIAGNOSIS — Z79899 Other long term (current) drug therapy: Secondary | ICD-10-CM | POA: Diagnosis not present

## 2014-02-20 DIAGNOSIS — I319 Disease of pericardium, unspecified: Secondary | ICD-10-CM | POA: Diagnosis not present

## 2014-02-20 DIAGNOSIS — I313 Pericardial effusion (noninflammatory): Secondary | ICD-10-CM | POA: Diagnosis present

## 2014-02-20 DIAGNOSIS — K810 Acute cholecystitis: Secondary | ICD-10-CM | POA: Diagnosis not present

## 2014-02-20 DIAGNOSIS — R1084 Generalized abdominal pain: Secondary | ICD-10-CM | POA: Diagnosis not present

## 2014-02-20 DIAGNOSIS — K8030 Calculus of bile duct with cholangitis, unspecified, without obstruction: Secondary | ICD-10-CM | POA: Diagnosis not present

## 2014-02-20 DIAGNOSIS — K8000 Calculus of gallbladder with acute cholecystitis without obstruction: Secondary | ICD-10-CM | POA: Diagnosis not present

## 2014-02-20 DIAGNOSIS — R932 Abnormal findings on diagnostic imaging of liver and biliary tract: Secondary | ICD-10-CM | POA: Diagnosis not present

## 2014-02-20 DIAGNOSIS — I4892 Unspecified atrial flutter: Secondary | ICD-10-CM | POA: Diagnosis not present

## 2014-02-20 DIAGNOSIS — N390 Urinary tract infection, site not specified: Secondary | ICD-10-CM | POA: Diagnosis present

## 2014-02-20 DIAGNOSIS — I483 Typical atrial flutter: Secondary | ICD-10-CM | POA: Diagnosis not present

## 2014-02-20 DIAGNOSIS — K219 Gastro-esophageal reflux disease without esophagitis: Secondary | ICD-10-CM | POA: Diagnosis present

## 2014-02-20 DIAGNOSIS — R109 Unspecified abdominal pain: Secondary | ICD-10-CM | POA: Diagnosis not present

## 2014-02-20 DIAGNOSIS — Z888 Allergy status to other drugs, medicaments and biological substances status: Secondary | ICD-10-CM

## 2014-02-20 DIAGNOSIS — K567 Ileus, unspecified: Secondary | ICD-10-CM | POA: Diagnosis not present

## 2014-02-20 DIAGNOSIS — K830 Cholangitis: Secondary | ICD-10-CM | POA: Diagnosis not present

## 2014-02-20 DIAGNOSIS — K8051 Calculus of bile duct without cholangitis or cholecystitis with obstruction: Secondary | ICD-10-CM | POA: Diagnosis not present

## 2014-02-20 DIAGNOSIS — R7989 Other specified abnormal findings of blood chemistry: Secondary | ICD-10-CM | POA: Diagnosis present

## 2014-02-20 DIAGNOSIS — D649 Anemia, unspecified: Secondary | ICD-10-CM | POA: Diagnosis present

## 2014-02-20 DIAGNOSIS — G609 Hereditary and idiopathic neuropathy, unspecified: Secondary | ICD-10-CM | POA: Diagnosis present

## 2014-02-20 DIAGNOSIS — R945 Abnormal results of liver function studies: Secondary | ICD-10-CM | POA: Diagnosis not present

## 2014-02-20 DIAGNOSIS — K859 Acute pancreatitis, unspecified: Secondary | ICD-10-CM | POA: Diagnosis present

## 2014-02-20 DIAGNOSIS — Z7982 Long term (current) use of aspirin: Secondary | ICD-10-CM

## 2014-02-20 DIAGNOSIS — K8050 Calculus of bile duct without cholangitis or cholecystitis without obstruction: Secondary | ICD-10-CM | POA: Diagnosis not present

## 2014-02-20 DIAGNOSIS — I484 Atypical atrial flutter: Secondary | ICD-10-CM | POA: Diagnosis not present

## 2014-02-20 DIAGNOSIS — R1013 Epigastric pain: Secondary | ICD-10-CM | POA: Diagnosis not present

## 2014-02-20 LAB — COMPREHENSIVE METABOLIC PANEL
ALT: 15 U/L (ref 0–35)
AST: 20 U/L (ref 0–37)
Albumin: 4.3 g/dL (ref 3.5–5.2)
Alkaline Phosphatase: 81 U/L (ref 39–117)
Anion gap: 17 — ABNORMAL HIGH (ref 5–15)
BUN: 18 mg/dL (ref 6–23)
CO2: 25 mEq/L (ref 19–32)
Calcium: 10.4 mg/dL (ref 8.4–10.5)
Chloride: 94 mEq/L — ABNORMAL LOW (ref 96–112)
Creatinine, Ser: 0.71 mg/dL (ref 0.50–1.10)
GFR calc Af Amer: >90 mL/min (ref >90)
GFR calc non Af Amer: 78 mL/min — ABNORMAL LOW (ref >90)
Glucose, Bld: 147 mg/dL — ABNORMAL HIGH (ref 70–99)
Potassium: 4 mEq/L (ref 3.7–5.3)
Sodium: 136 mEq/L — ABNORMAL LOW (ref 137–147)
Total Bilirubin: 0.4 mg/dL (ref 0.3–1.2)
Total Protein: 7.5 g/dL (ref 6.0–8.3)

## 2014-02-20 LAB — CBC WITH DIFFERENTIAL/PLATELET
Basophils Absolute: 0.1 10*3/uL (ref 0.0–0.1)
Basophils Relative: 1 % (ref 0–1)
Eosinophils Absolute: 0.3 10*3/uL (ref 0.0–0.7)
Eosinophils Relative: 3 % (ref 0–5)
HCT: 41.3 % (ref 36.0–46.0)
Hemoglobin: 14.3 g/dL (ref 12.0–15.0)
Lymphocytes Relative: 17 % (ref 12–46)
Lymphs Abs: 2.1 10*3/uL (ref 0.7–4.0)
MCH: 30.3 pg (ref 26.0–34.0)
MCHC: 34.6 g/dL (ref 30.0–36.0)
MCV: 87.5 fL (ref 78.0–100.0)
Monocytes Absolute: 0.7 10*3/uL (ref 0.1–1.0)
Monocytes Relative: 6 % (ref 3–12)
Neutro Abs: 9.2 10*3/uL — ABNORMAL HIGH (ref 1.7–7.7)
Neutrophils Relative %: 73 % (ref 43–77)
Platelets: 259 10*3/uL (ref 150–400)
RBC: 4.72 MIL/uL (ref 3.87–5.11)
RDW: 12.2 % (ref 11.5–15.5)
WBC: 12.4 10*3/uL — ABNORMAL HIGH (ref 4.0–10.5)

## 2014-02-20 LAB — I-STAT TROPONIN, ED: Troponin i, poc: 0 ng/mL (ref 0.00–0.08)

## 2014-02-20 LAB — LIPASE, BLOOD: Lipase: 66 U/L — ABNORMAL HIGH (ref 11–59)

## 2014-02-20 MED ORDER — PNEUMOCOCCAL 13-VAL CONJ VACC IM SUSP: 0.5000 mL | Freq: Once | INTRAMUSCULAR | Status: DC

## 2014-02-20 MED ORDER — FENTANYL CITRATE 0.05 MG/ML IJ SOLN
50.0000 ug | Freq: Once | INTRAMUSCULAR | Status: AC
  Administered 2014-02-20: 50 ug via INTRAVENOUS
  Filled 2014-02-20: qty 2

## 2014-02-20 NOTE — Telephone Encounter (Signed)
Left a message on home phone, per the pt, informing her that the rx has been sent to the pharmacy.

## 2014-02-20 NOTE — Telephone Encounter (Signed)
Pt needs rx for prevnar 13 vaccine fax to [phone]  [facility] [address] . Please call pt once rx has been fax ok to leave message on machine

## 2014-02-20 NOTE — ED Notes (Signed)
Pt. reports epigastric pain with emesis onset this evening , denies diarrhea. No fever or chills.

## 2014-02-21 ENCOUNTER — Encounter (HOSPITAL_COMMUNITY): Payer: Self-pay | Admitting: Radiology

## 2014-02-21 ENCOUNTER — Ambulatory Visit: Payer: Medicare Other

## 2014-02-21 ENCOUNTER — Observation Stay (HOSPITAL_COMMUNITY): Payer: Medicare Other

## 2014-02-21 ENCOUNTER — Encounter (HOSPITAL_COMMUNITY): Admission: EM | Disposition: A | Discharge status: Home Health | Payer: Self-pay | Source: Home / Self Care

## 2014-02-21 ENCOUNTER — Observation Stay (HOSPITAL_COMMUNITY): Payer: Medicare Other | Admitting: Anesthesiology

## 2014-02-21 ENCOUNTER — Inpatient Hospital Stay (HOSPITAL_COMMUNITY)
Admission: EM | Admit: 2014-02-21 | Discharge: 2014-02-27 | DRG: 417 | Disposition: A | Discharge status: Home Health | Payer: Medicare Other | Attending: General Surgery | Admitting: General Surgery

## 2014-02-21 ENCOUNTER — Encounter (HOSPITAL_COMMUNITY): Payer: Medicare Other | Admitting: Anesthesiology

## 2014-02-21 ENCOUNTER — Emergency Department (HOSPITAL_COMMUNITY): Payer: Medicare Other

## 2014-02-21 DIAGNOSIS — I4892 Unspecified atrial flutter: Secondary | ICD-10-CM | POA: Diagnosis not present

## 2014-02-21 DIAGNOSIS — K8066 Calculus of gallbladder and bile duct with acute and chronic cholecystitis without obstruction: Secondary | ICD-10-CM | POA: Diagnosis not present

## 2014-02-21 DIAGNOSIS — K8019 Calculus of gallbladder with other cholecystitis with obstruction: Secondary | ICD-10-CM | POA: Diagnosis not present

## 2014-02-21 DIAGNOSIS — M1990 Unspecified osteoarthritis, unspecified site: Secondary | ICD-10-CM | POA: Diagnosis not present

## 2014-02-21 DIAGNOSIS — K819 Cholecystitis, unspecified: Secondary | ICD-10-CM

## 2014-02-21 DIAGNOSIS — IMO0001 Reserved for inherently not codable concepts without codable children: Secondary | ICD-10-CM

## 2014-02-21 DIAGNOSIS — K8042 Calculus of bile duct with acute cholecystitis without obstruction: Secondary | ICD-10-CM | POA: Diagnosis not present

## 2014-02-21 DIAGNOSIS — I313 Pericardial effusion (noninflammatory): Secondary | ICD-10-CM | POA: Diagnosis not present

## 2014-02-21 DIAGNOSIS — K8012 Calculus of gallbladder with acute and chronic cholecystitis without obstruction: Secondary | ICD-10-CM | POA: Diagnosis not present

## 2014-02-21 DIAGNOSIS — R109 Unspecified abdominal pain: Secondary | ICD-10-CM

## 2014-02-21 DIAGNOSIS — K810 Acute cholecystitis: Secondary | ICD-10-CM | POA: Diagnosis not present

## 2014-02-21 DIAGNOSIS — K8030 Calculus of bile duct with cholangitis, unspecified, without obstruction: Secondary | ICD-10-CM | POA: Diagnosis not present

## 2014-02-21 DIAGNOSIS — T8859XA Other complications of anesthesia, initial encounter: Secondary | ICD-10-CM

## 2014-02-21 DIAGNOSIS — R1084 Generalized abdominal pain: Secondary | ICD-10-CM

## 2014-02-21 DIAGNOSIS — K859 Acute pancreatitis, unspecified: Secondary | ICD-10-CM | POA: Diagnosis not present

## 2014-02-21 DIAGNOSIS — I480 Paroxysmal atrial fibrillation: Secondary | ICD-10-CM

## 2014-02-21 DIAGNOSIS — I499 Cardiac arrhythmia, unspecified: Secondary | ICD-10-CM

## 2014-02-21 DIAGNOSIS — K219 Gastro-esophageal reflux disease without esophagitis: Secondary | ICD-10-CM | POA: Diagnosis not present

## 2014-02-21 DIAGNOSIS — K8000 Calculus of gallbladder with acute cholecystitis without obstruction: Secondary | ICD-10-CM | POA: Diagnosis present

## 2014-02-21 HISTORY — DX: Cholecystitis, unspecified: K81.9

## 2014-02-21 HISTORY — DX: Other complications of anesthesia, initial encounter: T88.59XA

## 2014-02-21 HISTORY — DX: Cardiac arrhythmia, unspecified: I49.9

## 2014-02-21 HISTORY — PX: CHOLECYSTECTOMY: SHX55

## 2014-02-21 LAB — URINE MICROSCOPIC-ADD ON

## 2014-02-21 LAB — CBC
HCT: 37.6 % (ref 36.0–46.0)
Hemoglobin: 13 g/dL (ref 12.0–15.0)
MCH: 30.5 pg (ref 26.0–34.0)
MCHC: 34.6 g/dL (ref 30.0–36.0)
MCV: 88.3 fL (ref 78.0–100.0)
Platelets: 223 10*3/uL (ref 150–400)
RBC: 4.26 MIL/uL (ref 3.87–5.11)
RDW: 12.5 % (ref 11.5–15.5)
WBC: 15.6 10*3/uL — ABNORMAL HIGH (ref 4.0–10.5)

## 2014-02-21 LAB — URINALYSIS, ROUTINE W REFLEX MICROSCOPIC
Bilirubin Urine: NEGATIVE
Glucose, UA: NEGATIVE mg/dL
Hgb urine dipstick: NEGATIVE
Ketones, ur: 15 mg/dL — AB
Nitrite: POSITIVE — AB
Protein, ur: NEGATIVE mg/dL
Specific Gravity, Urine: 1.013 (ref 1.005–1.030)
Urobilinogen, UA: 0.2 mg/dL (ref 0.0–1.0)
pH: 7.5 (ref 5.0–8.0)

## 2014-02-21 LAB — SURGICAL PCR SCREEN
MRSA, PCR: NEGATIVE
Staphylococcus aureus: NEGATIVE

## 2014-02-21 LAB — CREATININE, SERUM
Creatinine, Ser: 0.66 mg/dL (ref 0.50–1.10)
GFR calc Af Amer: >90 mL/min (ref >90)
GFR calc non Af Amer: 80 mL/min — ABNORMAL LOW (ref >90)

## 2014-02-21 SURGERY — LAPAROSCOPIC CHOLECYSTECTOMY WITH INTRAOPERATIVE CHOLANGIOGRAM
Anesthesia: General | Site: Abdomen

## 2014-02-21 MED ORDER — HYDROMORPHONE HCL 1 MG/ML IJ SOLN
0.5000 mg | INTRAMUSCULAR | Status: DC | PRN
  Administered 2014-02-21 – 2014-02-24 (×8): 1 mg via INTRAVENOUS
  Administered 2014-02-24 (×2): 2 mg via INTRAVENOUS
  Administered 2014-02-24 – 2014-02-25 (×3): 1 mg via INTRAVENOUS
  Filled 2014-02-21 (×2): qty 1
  Filled 2014-02-21: qty 2
  Filled 2014-02-21 (×8): qty 1
  Filled 2014-02-21: qty 2
  Filled 2014-02-21: qty 1

## 2014-02-21 MED ORDER — NEOSTIGMINE METHYLSULFATE 10 MG/10ML IV SOLN
INTRAVENOUS | Status: AC
  Filled 2014-02-21: qty 1

## 2014-02-21 MED ORDER — DEXTROSE 5 % IV SOLN
1.0000 g | Freq: Once | INTRAVENOUS | Status: AC
  Administered 2014-02-21: 1 g via INTRAVENOUS
  Filled 2014-02-21: qty 10

## 2014-02-21 MED ORDER — BUPIVACAINE-EPINEPHRINE (PF) 0.25% -1:200000 IJ SOLN
INTRAMUSCULAR | Status: AC
  Filled 2014-02-21: qty 30

## 2014-02-21 MED ORDER — GLYCOPYRROLATE 0.2 MG/ML IJ SOLN
INTRAMUSCULAR | Status: AC
  Filled 2014-02-21: qty 2

## 2014-02-21 MED ORDER — ONDANSETRON HCL 4 MG/2ML IJ SOLN
4.0000 mg | Freq: Four times a day (QID) | INTRAMUSCULAR | Status: DC | PRN
  Administered 2014-02-21 – 2014-02-24 (×3): 4 mg via INTRAVENOUS
  Filled 2014-02-21 (×4): qty 2

## 2014-02-21 MED ORDER — HYDROMORPHONE HCL 1 MG/ML IJ SOLN
0.2500 mg | INTRAMUSCULAR | Status: DC | PRN
  Administered 2014-02-21 (×2): 0.5 mg via INTRAVENOUS
  Administered 2014-02-21: 0.25 mg via INTRAVENOUS
  Administered 2014-02-21: 0.5 mg via INTRAVENOUS
  Administered 2014-02-21: 0.25 mg via INTRAVENOUS

## 2014-02-21 MED ORDER — CEFTRIAXONE SODIUM 1 G IJ SOLR
1.0000 g | Freq: Once | INTRAMUSCULAR | Status: AC
  Administered 2014-02-21: 1 g via INTRAVENOUS
  Filled 2014-02-21: qty 10

## 2014-02-21 MED ORDER — ROCURONIUM BROMIDE 100 MG/10ML IV SOLN
INTRAVENOUS | Status: DC | PRN
  Administered 2014-02-21: 30 mg via INTRAVENOUS

## 2014-02-21 MED ORDER — OXYCODONE HCL 5 MG/5ML PO SOLN: 5.0000 mg | Freq: Once | ORAL | Status: DC | PRN

## 2014-02-21 MED ORDER — SODIUM CHLORIDE 0.9 % IV SOLN
INTRAVENOUS | Status: DC | PRN
  Administered 2014-02-21: 11:00:00

## 2014-02-21 MED ORDER — BUPIVACAINE-EPINEPHRINE 0.25% -1:200000 IJ SOLN
INTRAMUSCULAR | Status: DC | PRN
  Administered 2014-02-21: 14 mL

## 2014-02-21 MED ORDER — ENOXAPARIN SODIUM 40 MG/0.4ML ~~LOC~~ SOLN
40.0000 mg | SUBCUTANEOUS | Status: DC
Start: 2014-02-22 — End: 2014-02-21
  Filled 2014-02-21: qty 0.4

## 2014-02-21 MED ORDER — ONDANSETRON HCL 4 MG/2ML IJ SOLN
4.0000 mg | Freq: Once | INTRAMUSCULAR | Status: AC
  Administered 2014-02-21: 4 mg via INTRAVENOUS
  Filled 2014-02-21: qty 2

## 2014-02-21 MED ORDER — LIDOCAINE HCL (CARDIAC) 20 MG/ML IV SOLN
INTRAVENOUS | Status: DC | PRN
  Administered 2014-02-21: 50 mg via INTRAVENOUS

## 2014-02-21 MED ORDER — FAMOTIDINE IN NACL 20-0.9 MG/50ML-% IV SOLN
20.0000 mg | Freq: Once | INTRAVENOUS | Status: AC
  Administered 2014-02-21: 20 mg via INTRAVENOUS
  Filled 2014-02-21: qty 50

## 2014-02-21 MED ORDER — HYDROMORPHONE HCL 1 MG/ML IJ SOLN
0.5000 mg | INTRAMUSCULAR | Status: DC | PRN
  Administered 2014-02-21 – 2014-02-23 (×2): 0.5 mg via INTRAVENOUS
  Filled 2014-02-21 (×2): qty 1

## 2014-02-21 MED ORDER — OXYCODONE HCL 5 MG PO TABS: 5.0000 mg | ORAL_TABLET | Freq: Once | ORAL | Status: DC | PRN

## 2014-02-21 MED ORDER — HEMOSTATIC AGENTS (NO CHARGE) OPTIME
TOPICAL | Status: DC | PRN
  Administered 2014-02-21: 1

## 2014-02-21 MED ORDER — SODIUM CHLORIDE 0.9 % IV BOLUS (SEPSIS)
1000.0000 mL | Freq: Once | INTRAVENOUS | Status: AC
  Administered 2014-02-21: 1000 mL via INTRAVENOUS

## 2014-02-21 MED ORDER — PROPOFOL 10 MG/ML IV BOLUS
INTRAVENOUS | Status: AC
  Filled 2014-02-21: qty 20

## 2014-02-21 MED ORDER — IOHEXOL 300 MG/ML  SOLN
25.0000 mL | Freq: Once | INTRAMUSCULAR | Status: AC | PRN
  Administered 2014-02-21: 25 mL via ORAL

## 2014-02-21 MED ORDER — PHENYLEPHRINE 40 MCG/ML (10ML) SYRINGE FOR IV PUSH (FOR BLOOD PRESSURE SUPPORT)
PREFILLED_SYRINGE | INTRAVENOUS | Status: AC
  Filled 2014-02-21: qty 20

## 2014-02-21 MED ORDER — ALUM & MAG HYDROXIDE-SIMETH 200-200-20 MG/5ML PO SUSP
30.0000 mL | Freq: Once | ORAL | Status: AC
  Administered 2014-02-21: 30 mL via ORAL
  Filled 2014-02-21: qty 30

## 2014-02-21 MED ORDER — ARTIFICIAL TEARS OP OINT
TOPICAL_OINTMENT | OPHTHALMIC | Status: AC
  Filled 2014-02-21: qty 3.5

## 2014-02-21 MED ORDER — SODIUM CHLORIDE 0.9 % IR SOLN
Status: DC | PRN
  Administered 2014-02-21 (×3): 1000 mL

## 2014-02-21 MED ORDER — ROCURONIUM BROMIDE 50 MG/5ML IV SOLN
INTRAVENOUS | Status: AC
  Filled 2014-02-21: qty 1

## 2014-02-21 MED ORDER — IOHEXOL 300 MG/ML  SOLN
100.0000 mL | Freq: Once | INTRAMUSCULAR | Status: AC | PRN
  Administered 2014-02-21: 100 mL via INTRAVENOUS

## 2014-02-21 MED ORDER — HYDROCODONE-ACETAMINOPHEN 5-325 MG PO TABS
1.0000 | ORAL_TABLET | ORAL | Status: DC | PRN
  Administered 2014-02-21 – 2014-02-22 (×3): 2 via ORAL
  Administered 2014-02-23: 1 via ORAL
  Filled 2014-02-21 (×2): qty 2
  Filled 2014-02-21: qty 1
  Filled 2014-02-21: qty 2

## 2014-02-21 MED ORDER — HYDROMORPHONE HCL 1 MG/ML IJ SOLN
INTRAMUSCULAR | Status: AC
  Filled 2014-02-21: qty 1

## 2014-02-21 MED ORDER — CIPROFLOXACIN IN D5W 400 MG/200ML IV SOLN
400.0000 mg | Freq: Two times a day (BID) | INTRAVENOUS | Status: DC
  Administered 2014-02-21 – 2014-02-22 (×2): 400 mg via INTRAVENOUS
  Filled 2014-02-21 (×4): qty 200

## 2014-02-21 MED ORDER — FENTANYL CITRATE 0.05 MG/ML IJ SOLN
INTRAMUSCULAR | Status: AC
  Filled 2014-02-21: qty 5

## 2014-02-21 MED ORDER — DEXTROSE 5 % IV SOLN
1.0000 g | INTRAVENOUS | Status: AC
  Administered 2014-02-21: 1 g via INTRAVENOUS
  Filled 2014-02-21: qty 1

## 2014-02-21 MED ORDER — DEXTROSE IN LACTATED RINGERS 5 % IV SOLN
INTRAVENOUS | Status: DC
  Administered 2014-02-22 – 2014-02-23 (×4): via INTRAVENOUS

## 2014-02-21 MED ORDER — LACTATED RINGERS IV SOLN
INTRAVENOUS | Status: DC | PRN
  Administered 2014-02-21: 1000 mL
  Administered 2014-02-21 (×2): via INTRAVENOUS

## 2014-02-21 MED ORDER — FENTANYL CITRATE 0.05 MG/ML IJ SOLN
INTRAMUSCULAR | Status: DC | PRN
  Administered 2014-02-21 (×5): 50 ug via INTRAVENOUS

## 2014-02-21 MED ORDER — MORPHINE SULFATE 4 MG/ML IJ SOLN
4.0000 mg | Freq: Once | INTRAMUSCULAR | Status: AC
  Administered 2014-02-21: 4 mg via INTRAVENOUS
  Filled 2014-02-21: qty 1

## 2014-02-21 MED ORDER — 0.9 % SODIUM CHLORIDE (POUR BTL) OPTIME
TOPICAL | Status: DC | PRN
  Administered 2014-02-21: 1000 mL

## 2014-02-21 MED ORDER — GLYCOPYRROLATE 0.2 MG/ML IJ SOLN
INTRAMUSCULAR | Status: DC | PRN
  Administered 2014-02-21: 0.4 mg via INTRAVENOUS

## 2014-02-21 MED ORDER — GABAPENTIN 600 MG PO TABS
600.0000 mg | ORAL_TABLET | Freq: Four times a day (QID) | ORAL | Status: DC
  Administered 2014-02-21 – 2014-02-27 (×18): 600 mg via ORAL
  Filled 2014-02-21 (×31): qty 1

## 2014-02-21 MED ORDER — ONDANSETRON HCL 4 MG/2ML IJ SOLN
INTRAMUSCULAR | Status: DC | PRN
Start: 2014-02-21 — End: 2014-02-21
  Administered 2014-02-21: 4 mg via INTRAVENOUS

## 2014-02-21 MED ORDER — PROMETHAZINE HCL 25 MG/ML IJ SOLN: 6.2500 mg | INTRAMUSCULAR | Status: DC | PRN

## 2014-02-21 MED ORDER — PROPOFOL 10 MG/ML IV BOLUS
INTRAVENOUS | Status: DC | PRN
Start: 2014-02-21 — End: 2014-02-21
  Administered 2014-02-21: 100 mg via INTRAVENOUS

## 2014-02-21 MED ORDER — NEOSTIGMINE METHYLSULFATE 10 MG/10ML IV SOLN
INTRAVENOUS | Status: DC | PRN
  Administered 2014-02-21: 3 mg via INTRAVENOUS

## 2014-02-21 MED ORDER — ONDANSETRON HCL 4 MG/2ML IJ SOLN
INTRAMUSCULAR | Status: AC
  Filled 2014-02-21: qty 2

## 2014-02-21 MED ORDER — DEXAMETHASONE SODIUM PHOSPHATE 4 MG/ML IJ SOLN
INTRAMUSCULAR | Status: DC | PRN
  Administered 2014-02-21: 4 mg via INTRAVENOUS

## 2014-02-21 SURGICAL SUPPLY — 47 items
ADH SKN CLS APL DERMABOND .7 (GAUZE/BANDAGES/DRESSINGS) ×1
APPLIER CLIP 5 13 M/L LIGAMAX5 (MISCELLANEOUS) ×2
APR CLP MED LRG 5 ANG JAW (MISCELLANEOUS) ×1
BAG SPEC RTRVL LRG 6X4 10 (ENDOMECHANICALS) ×1
CANISTER SUCTION 2500CC (MISCELLANEOUS) ×2 IMPLANT
CHLORAPREP W/TINT 26ML (MISCELLANEOUS) ×2 IMPLANT
CLIP APPLIE 5 13 M/L LIGAMAX5 (MISCELLANEOUS) IMPLANT
CLSR STERI-STRIP ANTIMIC 1/2X4 (GAUZE/BANDAGES/DRESSINGS) ×1 IMPLANT
COVER MAYO STAND STRL (DRAPES) ×2 IMPLANT
COVER SURGICAL LIGHT HANDLE (MISCELLANEOUS) ×2 IMPLANT
DERMABOND ADVANCED (GAUZE/BANDAGES/DRESSINGS) ×1
DERMABOND ADVANCED .7 DNX12 (GAUZE/BANDAGES/DRESSINGS) ×1 IMPLANT
DRAPE C-ARM 42X72 X-RAY (DRAPES) ×2 IMPLANT
DRAPE LAPAROSCOPIC ABDOMINAL (DRAPES) ×2 IMPLANT
DRAPE UTILITY 15X26 W/TAPE STR (DRAPE) ×4 IMPLANT
DRSG TEGADERM 2-3/8X2-3/4 SM (GAUZE/BANDAGES/DRESSINGS) ×5 IMPLANT
ELECT REM PT RETURN 9FT ADLT (ELECTROSURGICAL) ×2
ELECTRODE REM PT RTRN 9FT ADLT (ELECTROSURGICAL) ×1 IMPLANT
GLOVE BIO SURGEON STRL SZ 6.5 (GLOVE) ×3 IMPLANT
GLOVE BIO SURGEON STRL SZ7 (GLOVE) ×1 IMPLANT
GLOVE BIOGEL PI IND STRL 7.0 (GLOVE) IMPLANT
GLOVE BIOGEL PI IND STRL 8 (GLOVE) ×1 IMPLANT
GLOVE BIOGEL PI INDICATOR 7.0 (GLOVE) ×1
GLOVE BIOGEL PI INDICATOR 8 (GLOVE) ×1
GLOVE ECLIPSE 7.5 STRL STRAW (GLOVE) ×2 IMPLANT
GOWN STRL REUS W/ TWL LRG LVL3 (GOWN DISPOSABLE) ×3 IMPLANT
GOWN STRL REUS W/TWL 2XL LVL3 (GOWN DISPOSABLE) ×1 IMPLANT
GOWN STRL REUS W/TWL LRG LVL3 (GOWN DISPOSABLE) ×6
HEMOSTAT SNOW SURGICEL 2X4 (HEMOSTASIS) ×1 IMPLANT
KIT BASIN OR (CUSTOM PROCEDURE TRAY) ×2 IMPLANT
KIT ROOM TURNOVER OR (KITS) ×2 IMPLANT
NS IRRIG 1000ML POUR BTL (IV SOLUTION) ×2 IMPLANT
PAD ARMBOARD 7.5X6 YLW CONV (MISCELLANEOUS) ×2 IMPLANT
POUCH SPECIMEN RETRIEVAL 10MM (ENDOMECHANICALS) ×1 IMPLANT
SCISSORS LAP 5X35 DISP (ENDOMECHANICALS) ×2 IMPLANT
SET CHOLANGIOGRAPH 5 50 .035 (SET/KITS/TRAYS/PACK) ×2 IMPLANT
SET IRRIG TUBING LAPAROSCOPIC (IRRIGATION / IRRIGATOR) ×2 IMPLANT
SLEEVE ENDOPATH XCEL 5M (ENDOMECHANICALS) ×2 IMPLANT
SPECIMEN JAR SMALL (MISCELLANEOUS) ×2 IMPLANT
SUT MNCRL AB 4-0 PS2 18 (SUTURE) ×2 IMPLANT
TOWEL OR 17X24 6PK STRL BLUE (TOWEL DISPOSABLE) ×2 IMPLANT
TOWEL OR 17X26 10 PK STRL BLUE (TOWEL DISPOSABLE) ×2 IMPLANT
TRAY LAPAROSCOPIC (CUSTOM PROCEDURE TRAY) ×2 IMPLANT
TROCAR XCEL BLUNT TIP 100MML (ENDOMECHANICALS) ×2 IMPLANT
TROCAR XCEL NON-BLD 11X100MML (ENDOMECHANICALS) IMPLANT
TROCAR XCEL NON-BLD 5MMX100MML (ENDOMECHANICALS) ×2 IMPLANT
TUBING INSUFFLATION (TUBING) ×2 IMPLANT

## 2014-02-21 NOTE — ED Provider Notes (Signed)
CSN: [id number]     Arrival date & time 02/20/14  2244 History   First MD Initiated Contact with Patient 02/21/14 0013     Chief Complaint  Patient presents with  . 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 Patient states it is mid epigastric and constant. This began around 7 PM when she experienced indigestion. She took Pepcid and times without any relief. The passes normally relieves her pain. Patient describes it as a burning sensation without radiation. She's had emesis 5 nonbloody. She denies any other abdominal pain, changes in her urine or bowel movements. Had no fevers or recent infections. Nothing makes her pain worse. Patient denies any irregular vaginal bleeding. She has no further complaints.  10 Systems reviewed and are negative for acute change except as noted in the HPI.     Past Medical History  Diagnosis Date  . GERD (gastroesophageal reflux disease)     Had endoscopy no history of Barrett's  . Arthritis   . Cataract 2007    bilateral cat ext   . Unspecified hereditary and idiopathic peripheral neuropathy    Past Surgical History  Procedure Laterality Date  . Abdominal hysterectomy      Nine reasons  . Tonsillectomy    . Lumbar laminectomy/decompression microdiscectomy  02/01/2012    Procedure: LUMBAR LAMINECTOMY/DECOMPRESSION MICRODISCECTOMY 2 LEVELS;  Surgeon: Kristeen Miss, MD;  Location: [facility];  Service: Neurosurgery;  Laterality: Left;  Left Lumbar Four-Five Lumbar Five-Sacral One Laminectomies/Foraminotomies/Microscope   Family History  Problem Relation Age of Onset  . Neurodegenerative disease Mother     Mikey Bussing Drager died at 26  . Alcohol abuse    . Heart disease     History  Substance Use Topics  . Smoking status: Never Smoker   .  Smokeless tobacco: Never Used  . Alcohol Use: 4.2 oz/week    7 Glasses of wine per week   OB History   Grav Para Term Preterm Abortions TAB SAB Ect Mult Living                 Review of Systems    Allergies  Lyrica and Oxytetracycline  Home Medications   Prior to Admission medications   Medication Sig Start Date End Date Taking? Authorizing Provider  ascorbic acid (VITAMIN C) 1000 MG tablet Take 1,000 mg by mouth daily.    Historical Provider, MD  aspirin 81 MG tablet Take 81 mg by mouth daily.    Historical Provider, MD  calcium citrate-vitamin D (CITRACAL+D) 315-200 MG-UNIT per tablet Take 1 tablet by mouth daily.      Historical Provider, MD  celecoxib (CELEBREX) 200 MG capsule Take 1 capsule (200 mg total) by mouth daily as needed (arthritis pain). 10/23/13   Burnis Medin, MD  estradiol (VIVELLE-DOT) 0.025 MG/24HR Place 1 patch onto the skin once a week.     Historical Provider, MD  famotidine (PEPCID) 40 MG tablet Take 40 mg by mouth every morning.     Historical Provider, MD  gabapentin (NEURONTIN) 600 MG tablet Take 1 tablet (600 mg total) by mouth 4 (four) times daily. 01/04/14   Marcial Pacas, MD  HYDROcodone-ibuprofen (VICOPROFEN) 7.5-200 MG per tablet Take 1 tablet by mouth every 8 (eight) hours as needed. For pain 02/12/14   Marcial Pacas, MD  Multiple Vitamin (MULTIVITAMIN) tablet  Take 1 tablet by mouth daily.      Historical Provider, MD  pneumococcal 13-valent conjugate vaccine (PREVNAR 13) SUSP injection Inject 0.5 mLs into the muscle once. 02/20/14   Burnis Medin, MD  simvastatin (ZOCOR) 20 MG tablet Take 20 mg by mouth at bedtime.    Historical Provider, MD   BP 166/60  Pulse 56  Resp 18  SpO2 100% Physical Exam  Nursing note and vitals reviewed. Constitutional: She is oriented to person, place, and time. She appears well-developed and well-nourished. She appears distressed.  HENT:  Head: Normocephalic and atraumatic.  Nose: Nose normal.  Mouth/Throat: Oropharynx  is clear and moist. No oropharyngeal exudate.  Eyes: Conjunctivae and EOM are normal. Pupils are equal, round, and reactive to light. No scleral icterus.  Neck: Normal range of motion. Neck supple. No JVD present. No tracheal deviation present. No thyromegaly present.  Cardiovascular: Normal rate, regular rhythm and normal heart sounds.  Exam reveals no gallop and no friction rub.   No murmur heard. Pulmonary/Chest: Effort normal and breath sounds normal. No respiratory distress. She has no wheezes. She exhibits no tenderness.  Abdominal: Soft. Bowel sounds are normal. She exhibits no distension and no mass. There is tenderness. There is no rebound and no guarding.  Mid epigastric tenderness to palpation.  Musculoskeletal: Normal range of motion. She exhibits no edema and no tenderness.  Lymphadenopathy:    She has no cervical adenopathy.  Neurological: She is alert and oriented to person, place, and time. No cranial nerve deficit. She exhibits normal muscle tone.  Skin: Skin is warm and dry. No rash noted. She is not diaphoretic. No erythema. No pallor.    ED Course  Procedures (including critical care time) Labs Review Labs Reviewed  CBC WITH DIFFERENTIAL - Abnormal; Notable for the following:    WBC 12.4 (*)    Neutro Abs 9.2 (*)    All other components within normal limits  COMPREHENSIVE METABOLIC PANEL - Abnormal; Notable for the following:    Sodium 136 (*)    Chloride 94 (*)    Glucose, Bld 147 (*)    GFR calc non Af Amer 78 (*)    Anion gap 17 (*)    All other components within normal limits  LIPASE, BLOOD - Abnormal; Notable for the following:    Lipase 66 (*)    All other components within normal limits  URINALYSIS, ROUTINE W REFLEX MICROSCOPIC - Abnormal; Notable for the following:    APPearance CLOUDY (*)    Ketones, ur 15 (*)    Nitrite POSITIVE (*)    Leukocytes, UA SMALL (*)    All other components within normal limits  URINE MICROSCOPIC-ADD ON - Abnormal;  Notable for the following:    Bacteria, UA MANY (*)    Casts GRANULAR CAST (*)    All other components within normal limits  CBC - Abnormal; Notable for the following:    WBC 15.6 (*)    All other components within normal limits  CREATININE, SERUM - Abnormal; Notable for the following:    GFR calc non Af Amer 80 (*)    All other components within normal limits  URINE CULTURE  I-STAT TROPOININ, ED    Imaging Review Ct Abdomen Pelvis W Contrast  02/21/2014   CLINICAL DATA:  Epigastric pain with emesis.  EXAM: CT ABDOMEN AND PELVIS WITH CONTRAST  TECHNIQUE: Multidetector CT imaging of the abdomen and pelvis was performed using the standard protocol following bolus administration of  intravenous contrast.  CONTRAST:  130mL OMNIPAQUE IOHEXOL 300 MG/ML  SOLN  COMPARISON:  07/14/2009  FINDINGS: BODY WALL: Unremarkable.  LOWER CHEST: Small sliding-type hiatal hernia. There is nonspecific reticulation in the lower lungs.  ABDOMEN/PELVIS:  Liver: The liver has a lobulated surface and there is mild widening of the hepatic fissures. The surface nodularity is new or increased from 2011. No focal lesion.  Biliary: Gallbladder wall thickening and pericholecystic edema. There are stones layering within the gallbladder. The cystic duct has avid mucosal enhancement and likely contains stones. Best seen on reformatted imaging there is moderate suspicion for a 3 mm stone in the distal common bile duct.  Pancreas: No ductal enlargement or inflammatory change.  Spleen: Unremarkable.  Adrenals: Unremarkable.  Kidneys and ureters: No hydronephrosis or stone.  Bladder: Unremarkable.  Reproductive: Unremarkable.  Bowel: No bowel obstruction. Distal colonic diverticulosis. No pericecal inflammation. Small sliding-type hiatal hernia. Duodenal diverticula are present and uninflamed.  Retroperitoneum: No mass or adenopathy.  Peritoneum: No ascites or pneumoperitoneum.  Vascular: No acute abnormality.  OSSEOUS: Severe and diffuse  degenerative disc change Remote appearing T11 compression fracture.  IMPRESSION: 1. Findings consistent with acute cholecystitis. 2. Moderate suspicion for choledocholithiasis. 3. Morphologic changes suggesting cirrhosis. Correlate for risk factors.   Electronically Signed   By: Jorje Guild M.D.   On: 02/21/2014 03:38     EKG Interpretation None    MUSE not working: NSR, rate 70, normal intervals and axis, NSCLT  MDM   Final diagnoses:  None    Patient symptoms emergency department for abdominal pain, she states is consistent with her GERD. Medications she tried at home has not relieved her pain. She was given Zofran, famotidine, Maalox emergency department. Patient continues to have abdominal tenderness and will obtain CT of the abdomen for further evaluation.  CT scan reveals acute cholecystitis. Patient was given 2 g of IV ceftriaxone and was placed nothing by mouth. General surgery was consulted and will make the patient for operative treatment. Should and family are amenable with plan.   Everlene Balls, MD 02/21/14 0630

## 2014-02-21 NOTE — Anesthesia Postprocedure Evaluation (Signed)
  Anesthesia Post-op Note  Patient: Susan Ford  Procedure(s) Performed: Procedure(s): LAPAROSCOPIC CHOLECYSTECTOMY WITH INTRAOPERATIVE CHOLANGIOGRAM (N/A)  Patient Location: PACU  Anesthesia Type:General  Level of Consciousness: awake, alert  and oriented  Airway and Oxygen Therapy: Patient Spontanous Breathing  Post-op Pain: none  Post-op Assessment: Post-op Vital signs reviewed  Post-op Vital Signs: Reviewed  Last Vitals:  Filed Vitals:   02/21/14 1332  BP: 117/73  Pulse: 85  Temp:   Resp: 15    Complications: No apparent anesthesia complications

## 2014-02-21 NOTE — Op Note (Addendum)
OPERATIVE REPORT  DATE OF OPERATION: 02/21/2014  PATIENT:  Susan Ford  80 y.o. female  PRE-OPERATIVE DIAGNOSIS:  Cholecystitis  POST-OPERATIVE DIAGNOSIS:  Cholecystitis with CBD stones  PROCEDURE:  Procedure(s): LAPAROSCOPIC CHOLECYSTECTOMY WITH INTRAOPERATIVE CHOLANGIOGRAM  SURGEON:  Surgeon(s): Doreen Salvage, MD  ASSISTANT: None  ANESTHESIA:   general  EBL: 100 ml  BLOOD ADMINISTERED: none  DRAINS: none   SPECIMEN:  Source of Specimen:  Gallbladder and stones  COUNTS CORRECT:  YES  PROCEDURE DETAILS: The patient was taken to the operating room and placed on the table in the supine position.  After an adequate endotracheal anesthetic was administered, the patient was prepped with ChloroPrep, and then draped in the usual manner exposing the entire abdomen laterally, inferiorly and up  to the costal margins.  After a proper timeout was performed including identifying the patient and the procedure to be performed, a infraumbilical 9.7DZ midline incision was made using a #15 blade.  This was taken down to the fascia which was then incised with a #15 blade.  The edges of the fascia were tented up with Kocher clamps as the preperitoneal space was penetrated with a Kelly clamp into the peritoneum.  Once this was done, a pursestring suture of 0 Vicryl was passed around the fascial opening.  This was subsequently used to secure the [facility] cannula which was passed into the peritoneal cavity.  Once the [facility] cannula was in place, carbon dioxide gas was insufflated into the peritoneal cavity up to a maximal intra-abdominal pressure of 53mm Hg.The laparoscope, with attached camera and light source, was passed into the peritoneal cavity to visualize the direct insertion of two right upper quadrant 58mm cannulas, and a sup-xiphoid 40mm cannula.  Once all cannulas were in place, the dissection was begun.  Two ratcheted graspers were attached to the dome and infundibulum of the gallbladder  and retracted towards the anterior abdominal wall and the right upper quadrant.  Using cautery attached to a dissecting forceps, the peritoneum overlaying the triangle of Chalot and the hepatoduodenal triangle was dissected away exposing the cystic duct and the cystic artery.  The cystic artery was clipped proximally and distally then transected.  A clip was placed on the gallbladder side of the cystic duct, then a cholecystodochotomy made using the laparoscopic scissors.  Through the cholecystodochotomy a Cook catheter was passed to performed a cholangiogram.  The cholangiogram showed good proximal filling, no flow into the duodenum, distal CBD filling defects, but no ductal dilatation..  Once the cholangiogram was completed, the [facility] catheter was removed, and the distal cystic duct was clipped multiple times then transected between proximal and distal clips.  The gallbladder was then dissected out of the hepatic bed without event.  It was retrieved from the abdomen (using an EndoCatch bag) without event.  The liver capsul tore from adhesions while lifting the gallbladder.  This tear occurred on the inferior, right posterior surface of the live.  Bleeding was controlled with electrocautery and SurgiCel Snow.  Once the gallbladder was removed, the bed was inspected for hemostasis.  Once excellent hemostasis was obtained all gas and fluids were aspirated from above the liver, then the cannulas were removed.  The infraumbilical incision was closed using the pursestring suture which was in place.  0.25% bupivicaine with epinephrine was injected at all sites.  All 55mm or greater cannula sites were close using a running subcuticular stitch of 4-0 Monocryl.  5.25mm cannula sites were closed with Dermabond only.Steri-Strips and Tagaderm were used  to complete the dressings at all sites.  At this point all needle, sponge, and instrument counts were correct.The patient was awakened from anesthesia and taken to the  PACU in stable condition.      PATIENT DISPOSITION:  PACU - hemodynamically stable.   Nery Frappier, JAY 10/29/201512:23 PM

## 2014-02-21 NOTE — Consult Note (Signed)
[facility] CON[facility] Reason for consult: CBD stone Referring Physician: Dr. [name] is an 80 y.o. female.  HPI: she came to the emergency room with severe epigastric pain and CT scan showed thickened gallbladder with stones and did raise a question of possible CBD stone. She underwent laparoscopic cholecystectomy today. IOC showed stones in common.. Were asked to see her regarding possible ERCP. The patient's lab revealed totally normal liver test with the normal bilirubin alkaline phosphatase. Lipase was slightly elevated 66. Patient is currently still having some pain since she has returned from the recovery room.  Past Medical History  Diagnosis Date  . GERD (gastroesophageal reflux disease)     Had endoscopy no history of Barrett's  . Arthritis   . Cataract 2007    bilateral cat ext   . Unspecified hereditary and idiopathic peripheral neuropathy   . Cholecystitis     Past Surgical History  Procedure Laterality Date  . Abdominal hysterectomy      Nine reasons  . Tonsillectomy    . Lumbar laminectomy/decompression microdiscectomy  02/01/2012    Procedure: LUMBAR LAMINECTOMY/DECOMPRESSION MICRODISCECTOMY 2 LEVELS;  Surgeon: Kristeen Miss, MD;  Location: [facility];  Service: Neurosurgery;  Laterality: Left;  Left Lumbar Four-Five Lumbar Five-Sacral One Laminectomies/Foraminotomies/Microscope    Family History  Problem Relation Age of Onset  . Neurodegenerative disease Mother     Mikey Bussing Drager died at 66  . Alcohol abuse    . Heart disease      Social History:  reports that she has never smoked. She has never used smokeless tobacco. She reports that she drinks about 4.2 ounces of alcohol per week. She reports that she does not use illicit drugs.  Allergies:  Allergies  Allergen Reactions  . Lyrica [Pregabalin] Other (See Comments)    "High Fever"   . Oxytetracycline Other (See Comments)    "High Fever"    Medications; Prior to Admission  medications   Medication Sig Start Date End Date Taking? Authorizing Provider  ascorbic acid (VITAMIN C) 1000 MG tablet Take 1,000 mg by mouth daily.   Yes Historical Provider, MD  aspirin 81 MG tablet Take 81 mg by mouth daily.   Yes Historical Provider, MD  calcium citrate-vitamin D (CITRACAL+D) 315-200 MG-UNIT per tablet Take 1 tablet by mouth daily.     Yes Historical Provider, MD  celecoxib (CELEBREX) 200 MG capsule Take 1 capsule (200 mg total) by mouth daily as needed (arthritis pain). 10/23/13  Yes Burnis Medin, MD  estradiol (VIVELLE-DOT) 0.025 MG/24HR Place 1 patch onto the skin once a week. On Saturday   Yes Historical Provider, MD  famotidine (PEPCID) 40 MG tablet Take 40 mg by mouth every morning.    Yes Historical Provider, MD  gabapentin (NEURONTIN) 600 MG tablet Take 1 tablet (600 mg total) by mouth 4 (four) times daily. 01/04/14  Yes Marcial Pacas, MD  HYDROcodone-ibuprofen (VICOPROFEN) 7.5-200 MG per tablet Take 1 tablet by mouth every 8 (eight) hours as needed. For pain 02/12/14  Yes Marcial Pacas, MD  Multiple Vitamin (MULTIVITAMIN) tablet Take 1 tablet by mouth daily.    Yes Historical Provider, MD  simvastatin (ZOCOR) 20 MG tablet Take 20 mg by mouth at bedtime.   Yes Historical Provider, MD  pneumococcal 13-valent conjugate vaccine (PREVNAR 13) SUSP injection Inject 0.5 mLs into the muscle once. 02/20/14   Burnis Medin, MD   . ciprofloxacin  400 mg Intravenous Q12H  . gabapentin  600 mg Oral  QID   PRN Meds HYDROcodone-acetaminophen, HYDROmorphone (DILAUDID) injection, HYDROmorphone (DILAUDID) injection, ondansetron Results for orders placed during the hospital encounter of 02/21/14 (from the past 48 hour(s))  CBC WITH DIFFERENTIAL     Status: Abnormal   Collection Time    02/20/14 10:53 PM      Result Value Ref Range   WBC 12.4 (*) 4.0 - 10.5 K/uL   RBC 4.72  3.87 - 5.11 MIL/uL   Hemoglobin 14.3  12.0 - 15.0 g/dL   HCT 41.3  36.0 - 46.0 %   MCV 87.5  78.0 - 100.0 fL    MCH 30.3  26.0 - 34.0 pg   MCHC 34.6  30.0 - 36.0 g/dL   RDW 12.2  11.5 - 15.5 %   Platelets 259  150 - 400 K/uL   Neutrophils Relative % 73  43 - 77 %   Neutro Abs 9.2 (*) 1.7 - 7.7 K/uL   Lymphocytes Relative 17  12 - 46 %   Lymphs Abs 2.1  0.7 - 4.0 K/uL   Monocytes Relative 6  3 - 12 %   Monocytes Absolute 0.7  0.1 - 1.0 K/uL   Eosinophils Relative 3  0 - 5 %   Eosinophils Absolute 0.3  0.0 - 0.7 K/uL   Basophils Relative 1  0 - 1 %   Basophils Absolute 0.1  0.0 - 0.1 K/uL  COMPREHENSIVE METABOLIC PANEL     Status: Abnormal   Collection Time    02/20/14 10:53 PM      Result Value Ref Range   Sodium 136 (*) 137 - 147 mEq/L   Potassium 4.0  3.7 - 5.3 mEq/L   Chloride 94 (*) 96 - 112 mEq/L   CO2 25  19 - 32 mEq/L   Glucose, Bld 147 (*) 70 - 99 mg/dL   BUN 18  6 - 23 mg/dL   Creatinine, Ser 0.71  0.50 - 1.10 mg/dL   Calcium 10.4  8.4 - 10.5 mg/dL   Total Protein 7.5  6.0 - 8.3 g/dL   Albumin 4.3  3.5 - 5.2 g/dL   AST 20  0 - 37 U/L   ALT 15  0 - 35 U/L   Alkaline Phosphatase 81  39 - 117 U/L   Total Bilirubin 0.4  0.3 - 1.2 mg/dL   GFR calc non Af Amer 78 (*) >90 mL/min   GFR calc Af Amer >90  >90 mL/min   Comment: (NOTE)     The eGFR has been calculated using the CKD EPI equation.     This calculation has not been validated in all clinical situations.     eGFR's persistently <90 mL/min signify possible Chronic Kidney     Disease.   Anion gap 17 (*) 5 - 15  LIPASE, BLOOD     Status: Abnormal   Collection Time    02/20/14 10:53 PM      Result Value Ref Range   Lipase 66 (*) 11 - 59 U/L  I-STAT TROPOININ, ED     Status: None   Collection Time    02/20/14 11:10 PM      Result Value Ref Range   Troponin i, poc 0.00  0.00 - 0.08 ng/mL   Comment 3            Comment: Due to the release kinetics of cTnI,     a negative result within the first hours     of the onset of symptoms does  not rule out     myocardial infarction with certainty.     If myocardial infarction is  still suspected,     repeat the test at appropriate intervals.  URINALYSIS, ROUTINE W REFLEX MICROSCOPIC     Status: Abnormal   Collection Time    02/21/14 12:26 AM      Result Value Ref Range   Color, Urine YELLOW  YELLOW   APPearance CLOUDY (*) CLEAR   Specific Gravity, Urine 1.013  1.005 - 1.030   pH 7.5  5.0 - 8.0   Glucose, UA NEGATIVE  NEGATIVE mg/dL   Hgb urine dipstick NEGATIVE  NEGATIVE   Bilirubin Urine NEGATIVE  NEGATIVE   Ketones, ur 15 (*) NEGATIVE mg/dL   Protein, ur NEGATIVE  NEGATIVE mg/dL   Urobilinogen, UA 0.2  0.0 - 1.0 mg/dL   Nitrite POSITIVE (*) NEGATIVE   Leukocytes, UA SMALL (*) NEGATIVE  URINE MICROSCOPIC-ADD ON     Status: Abnormal   Collection Time    02/21/14 12:26 AM      Result Value Ref Range   Squamous Epithelial / LPF RARE  RARE   WBC, UA 7-10  <3 WBC/hpf   RBC / HPF 0-2  <3 RBC/hpf   Bacteria, UA MANY (*) RARE   Casts GRANULAR CAST (*) NEGATIVE  CBC     Status: Abnormal   Collection Time    02/21/14  5:05 AM      Result Value Ref Range   WBC 15.6 (*) 4.0 - 10.5 K/uL   RBC 4.26  3.87 - 5.11 MIL/uL   Hemoglobin 13.0  12.0 - 15.0 g/dL   HCT 37.6  36.0 - 46.0 %   MCV 88.3  78.0 - 100.0 fL   MCH 30.5  26.0 - 34.0 pg   MCHC 34.6  30.0 - 36.0 g/dL   RDW 12.5  11.5 - 15.5 %   Platelets 223  150 - 400 K/uL  CREATININE, SERUM     Status: Abnormal   Collection Time    02/21/14  5:05 AM      Result Value Ref Range   Creatinine, Ser 0.66  0.50 - 1.10 mg/dL   GFR calc non Af Amer 80 (*) >90 mL/min   GFR calc Af Amer >90  >90 mL/min   Comment: (NOTE)     The eGFR has been calculated using the CKD EPI equation.     This calculation has not been validated in all clinical situations.     eGFR's persistently <90 mL/min signify possible Chronic Kidney     Disease.  SURGICAL PCR SCREEN     Status: None   Collection Time    02/21/14  7:33 AM      Result Value Ref Range   MRSA, PCR NEGATIVE  NEGATIVE   Staphylococcus aureus NEGATIVE  NEGATIVE    Comment:            The Xpert SA Assay (FDA     approved for NASAL specimens     in patients over 14 years of age),     is one component of     a comprehensive surveillance     program.  Test performance has     been validated by [employer] for patients greater     than or equal to 37 year old.     It is not intended     to diagnose infection nor to     guide or  monitor treatment.    Dg Cholangiogram Operative  02/21/2014   CLINICAL DATA:  Cholecystitis.  EXAM: INTRAOPERATIVE CHOLANGIOGRAM  TECHNIQUE: Cholangiographic images from the C-arm fluoroscopic device were submitted for interpretation post-operatively. Please see the procedural report for the amount of contrast and the fluoroscopy time utilized.  COMPARISON:  CT scan of same day.  FINDINGS: Under fluoroscopy, contrast was injected through cannulated cystic duct remnant. Filling of the intrahepatic and extrahepatic biliary ducts is noted. Multiple rounded filling defects are noted in the distal common bile duct concerning for gallstones. These appear to be completely obstructive as no filling of the duodenum is noted.  IMPRESSION: Residual stones are noted in the distal common bile duct resulting in obstruction of the common bile duct. No filling of the duodenum is noted.   Electronically Signed   By: Sabino Dick M.D.   On: 02/21/2014 12:08   Ct Abdomen Pelvis W Contrast  02/21/2014   CLINICAL DATA:  Epigastric pain with emesis.  EXAM: CT ABDOMEN AND PELVIS WITH CONTRAST  TECHNIQUE: Multidetector CT imaging of the abdomen and pelvis was performed using the standard protocol following bolus administration of intravenous contrast.  CONTRAST:  165m OMNIPAQUE IOHEXOL 300 MG/ML  SOLN  COMPARISON:  07/14/2009  FINDINGS: BODY WALL: Unremarkable.  LOWER CHEST: Small sliding-type hiatal hernia. There is nonspecific reticulation in the lower lungs.  ABDOMEN/PELVIS:  Liver: The liver has a lobulated surface and there is mild widening of the  hepatic fissures. The surface nodularity is new or increased from 2011. No focal lesion.  Biliary: Gallbladder wall thickening and pericholecystic edema. There are stones layering within the gallbladder. The cystic duct has avid mucosal enhancement and likely contains stones. Best seen on reformatted imaging there is moderate suspicion for a 3 mm stone in the distal common bile duct.  Pancreas: No ductal enlargement or inflammatory change.  Spleen: Unremarkable.  Adrenals: Unremarkable.  Kidneys and ureters: No hydronephrosis or stone.  Bladder: Unremarkable.  Reproductive: Unremarkable.  Bowel: No bowel obstruction. Distal colonic diverticulosis. No pericecal inflammation. Small sliding-type hiatal hernia. Duodenal diverticula are present and uninflamed.  Retroperitoneum: No mass or adenopathy.  Peritoneum: No ascites or pneumoperitoneum.  Vascular: No acute abnormality.  OSSEOUS: Severe and diffuse degenerative disc change Remote appearing T11 compression fracture.  IMPRESSION: 1. Findings consistent with acute cholecystitis. 2. Moderate suspicion for choledocholithiasis. 3. Morphologic changes suggesting cirrhosis. Correlate for risk factors.   Electronically Signed   By: JJorje GuildM.D.   On: 02/21/2014 03:38               Blood pressure 141/78, pulse 105, temperature 97.5 F (36.4 C), temperature source Oral, resp. rate 18, height _0  (1.626 m), weight 63.322 kg (139 lb 9.6 oz), SpO2 93.00%.  Physical exam:   General--patient was quite sleepy and groggy postop Heart-- regular rate and rhythm without murmurs or gallops Lungs--clear Abdomen-- slightly distended and quiet with epigastric tenderness   Assessment: 1.  CBD stone. Suggested by CT as well as IOC. Liver test completely normal at this time. Stone does not appear to be impacted. She seems to be having the expected amount of postoperative pain.   Plan: the patient will need ERCP and stone extraction. She is somewhat  sleepy but her family is in the room with her and I explain the procedures to them all. Tomorrow schedule is full. We'll suggest repeating her labs in the morning and see how she is doing. She is feeling quite well in labs from  a normal we can arrange elective ERCP and stone removal next week. If her labs pop-up and she is still having pain, we will arrange ERCP late tomorrow or Saturday.    Susan Ford JR,Chandni Gagan L 02/21/2014, 5:41 PM

## 2014-02-21 NOTE — ED Notes (Signed)
Pt in CT.

## 2014-02-21 NOTE — Progress Notes (Signed)
Patient less symptomatic now, but has some RUQ pain.  NPO except sips with  Meds.  For surgery today.  Susan Ford. [name], MD, [place] [phone] [facility]

## 2014-02-21 NOTE — Progress Notes (Signed)
UR completed 

## 2014-02-21 NOTE — H&P (Signed)
Susan Ford is an 80 y.o. female.   Chief Complaint: abdominal pain HPI: Asked to see pt at request of Dr Ani for abdominal pain.  Location epigastrium  Severe started 24 hours ago constant ache.  Nothing brought it on. No radiation  Nothing made it better. CT shows thickened GB with stones and possible CBD stone.   Past Medical History  Diagnosis Date  . GERD (gastroesophageal reflux disease)     Had endoscopy no history of Barrett's  . Arthritis   . Cataract 2007    bilateral cat ext   . Unspecified hereditary and idiopathic peripheral neuropathy     Past Surgical History  Procedure Laterality Date  . Abdominal hysterectomy      Nine reasons  . Tonsillectomy    . Lumbar laminectomy/decompression microdiscectomy  02/01/2012    Procedure: LUMBAR LAMINECTOMY/DECOMPRESSION MICRODISCECTOMY 2 LEVELS;  Surgeon: Kristeen Miss, MD;  Location: [facility];  Service: Neurosurgery;  Laterality: Left;  Left Lumbar Four-Five Lumbar Five-Sacral One Laminectomies/Foraminotomies/Microscope    Family History  Problem Relation Age of Onset  . Neurodegenerative disease Mother     Mikey Bussing Drager died at 44  . Alcohol abuse    . Heart disease     Social History:  reports that she has never smoked. She has never used smokeless tobacco. She reports that she drinks about 4.2 ounces of alcohol per week. She reports that she does not use illicit drugs.  Allergies:  Allergies  Allergen Reactions  . Lyrica [Pregabalin] Other (See Comments)    "High Fever"   . Oxytetracycline Other (See Comments)    "High Fever"     (Not in a hospital admission)  Results for orders placed during the hospital encounter of 02/21/14 (from the past 48 hour(s))  CBC WITH DIFFERENTIAL     Status: Abnormal   Collection Time    02/20/14 10:53 PM      Result Value Ref Range   WBC 12.4 (*) 4.0 - 10.5 K/uL   RBC 4.72  3.87 - 5.11 MIL/uL   Hemoglobin 14.3  12.0 - 15.0 g/dL   HCT 41.3  36.0 - 46.0 %   MCV 87.5  78.0  - 100.0 fL   MCH 30.3  26.0 - 34.0 pg   MCHC 34.6  30.0 - 36.0 g/dL   RDW 12.2  11.5 - 15.5 %   Platelets 259  150 - 400 K/uL   Neutrophils Relative % 73  43 - 77 %   Neutro Abs 9.2 (*) 1.7 - 7.7 K/uL   Lymphocytes Relative 17  12 - 46 %   Lymphs Abs 2.1  0.7 - 4.0 K/uL   Monocytes Relative 6  3 - 12 %   Monocytes Absolute 0.7  0.1 - 1.0 K/uL   Eosinophils Relative 3  0 - 5 %   Eosinophils Absolute 0.3  0.0 - 0.7 K/uL   Basophils Relative 1  0 - 1 %   Basophils Absolute 0.1  0.0 - 0.1 K/uL  COMPREHENSIVE METABOLIC PANEL     Status: Abnormal   Collection Time    02/20/14 10:53 PM      Result Value Ref Range   Sodium 136 (*) 137 - 147 mEq/L   Potassium 4.0  3.7 - 5.3 mEq/L   Chloride 94 (*) 96 - 112 mEq/L   CO2 25  19 - 32 mEq/L   Glucose, Bld 147 (*) 70 - 99 mg/dL   BUN 18  6 - 23  mg/dL   Creatinine, Ser 0.71  0.50 - 1.10 mg/dL   Calcium 10.4  8.4 - 10.5 mg/dL   Total Protein 7.5  6.0 - 8.3 g/dL   Albumin 4.3  3.5 - 5.2 g/dL   AST 20  0 - 37 U/L   ALT 15  0 - 35 U/L   Alkaline Phosphatase 81  39 - 117 U/L   Total Bilirubin 0.4  0.3 - 1.2 mg/dL   GFR calc non Af Amer 78 (*) >90 mL/min   GFR calc Af Amer >90  >90 mL/min   Comment: (NOTE)     The eGFR has been calculated using the CKD EPI equation.     This calculation has not been validated in all clinical situations.     eGFR's persistently <90 mL/min signify possible Chronic Kidney     Disease.   Anion gap 17 (*) 5 - 15  LIPASE, BLOOD     Status: Abnormal   Collection Time    02/20/14 10:53 PM      Result Value Ref Range   Lipase 66 (*) 11 - 59 U/L  I-STAT TROPOININ, ED     Status: None   Collection Time    02/20/14 11:10 PM      Result Value Ref Range   Troponin i, poc 0.00  0.00 - 0.08 ng/mL   Comment 3            Comment: Due to the release kinetics of cTnI,     a negative result within the first hours     of the onset of symptoms does not rule out     myocardial infarction with certainty.     If myocardial  infarction is still suspected,     repeat the test at appropriate intervals.  URINALYSIS, ROUTINE W REFLEX MICROSCOPIC     Status: Abnormal   Collection Time    02/21/14 12:26 AM      Result Value Ref Range   Color, Urine YELLOW  YELLOW   APPearance CLOUDY (*) CLEAR   Specific Gravity, Urine 1.013  1.005 - 1.030   pH 7.5  5.0 - 8.0   Glucose, UA NEGATIVE  NEGATIVE mg/dL   Hgb urine dipstick NEGATIVE  NEGATIVE   Bilirubin Urine NEGATIVE  NEGATIVE   Ketones, ur 15 (*) NEGATIVE mg/dL   Protein, ur NEGATIVE  NEGATIVE mg/dL   Urobilinogen, UA 0.2  0.0 - 1.0 mg/dL   Nitrite POSITIVE (*) NEGATIVE   Leukocytes, UA SMALL (*) NEGATIVE  URINE MICROSCOPIC-ADD ON     Status: Abnormal   Collection Time    02/21/14 12:26 AM      Result Value Ref Range   Squamous Epithelial / LPF RARE  RARE   WBC, UA 7-10  <3 WBC/hpf   RBC / HPF 0-2  <3 RBC/hpf   Bacteria, UA MANY (*) RARE   Casts GRANULAR CAST (*) NEGATIVE   Ct Abdomen Pelvis W Contrast  02/21/2014   CLINICAL DATA:  Epigastric pain with emesis.  EXAM: CT ABDOMEN AND PELVIS WITH CONTRAST  TECHNIQUE: Multidetector CT imaging of the abdomen and pelvis was performed using the standard protocol following bolus administration of intravenous contrast.  CONTRAST:  157m OMNIPAQUE IOHEXOL 300 MG/ML  SOLN  COMPARISON:  07/14/2009  FINDINGS: BODY WALL: Unremarkable.  LOWER CHEST: Small sliding-type hiatal hernia. There is nonspecific reticulation in the lower lungs.  ABDOMEN/PELVIS:  Liver: The liver has a lobulated surface and there is mild widening of  the hepatic fissures. The surface nodularity is new or increased from 2011. No focal lesion.  Biliary: Gallbladder wall thickening and pericholecystic edema. There are stones layering within the gallbladder. The cystic duct has avid mucosal enhancement and likely contains stones. Best seen on reformatted imaging there is moderate suspicion for a 3 mm stone in the distal common bile duct.  Pancreas: No ductal  enlargement or inflammatory change.  Spleen: Unremarkable.  Adrenals: Unremarkable.  Kidneys and ureters: No hydronephrosis or stone.  Bladder: Unremarkable.  Reproductive: Unremarkable.  Bowel: No bowel obstruction. Distal colonic diverticulosis. No pericecal inflammation. Small sliding-type hiatal hernia. Duodenal diverticula are present and uninflamed.  Retroperitoneum: No mass or adenopathy.  Peritoneum: No ascites or pneumoperitoneum.  Vascular: No acute abnormality.  OSSEOUS: Severe and diffuse degenerative disc change Remote appearing T11 compression fracture.  IMPRESSION: 1. Findings consistent with acute cholecystitis. 2. Moderate suspicion for choledocholithiasis. 3. Morphologic changes suggesting cirrhosis. Correlate for risk factors.   Electronically Signed   By: Jorje Guild M.D.   On: 02/21/2014 03:38    Review of Systems  Constitutional: Negative.   HENT: Negative.   Eyes: Negative.   Respiratory: Negative.   Cardiovascular: Negative.   Gastrointestinal: Positive for nausea, vomiting and abdominal pain.  Genitourinary: Negative.   Musculoskeletal: Positive for back pain.  Skin: Negative.   Neurological: Negative.   Endo/Heme/Allergies: Negative.   Psychiatric/Behavioral: Negative.     Blood pressure 138/63, pulse 90, temperature 97.3 F (36.3 C), temperature source Oral, resp. rate 18, SpO2 96.00%. Physical Exam  Constitutional: She is oriented to person, place, and time. She appears well-developed and well-nourished. No distress.  HENT:  Head: Normocephalic and atraumatic.  Eyes: Pupils are equal, round, and reactive to light. No scleral icterus.  Neck: Normal range of motion. Neck supple.  Cardiovascular: Normal rate and regular rhythm.   Respiratory: Effort normal and breath sounds normal.  GI: There is tenderness in the right upper quadrant and epigastric area.  Musculoskeletal: Normal range of motion.  Neurological: She is alert and oriented to person, place, and  time.  Skin: Skin is warm and dry.  Psychiatric: She has a normal mood and affect. Her behavior is normal. Judgment and thought content normal.     Assessment/Plan Acute cholecystitis Mild GS pancreatitis Possible CBD stone Mild UTI Admit for ABX.  Cholecystectomy per Dr Esmond Plants A. 02/21/2014, 5:07 AM

## 2014-02-21 NOTE — ED Notes (Signed)
Dr.Oni aware of pain

## 2014-02-21 NOTE — ED Notes (Signed)
Called CT - pt finished contrast 

## 2014-02-21 NOTE — Anesthesia Preprocedure Evaluation (Addendum)
Anesthesia Evaluation  Patient identified by MRN, date of birth, ID band Patient awake    Reviewed: Allergy & Precautions, H&P , NPO status , Patient's Chart, lab work & pertinent test results  Airway Mallampati: III  TM Distance: >3 FB Neck ROM: Full    Dental  (+) Teeth Intact, Dental Advisory Given   Pulmonary neg pulmonary ROS,          Cardiovascular negative cardio ROS      Neuro/Psych negative neurological ROS  negative psych ROS   GI/Hepatic Neg liver ROS, GERD-  ,  Endo/Other  negative endocrine ROS  Renal/GU negative Renal ROS     Musculoskeletal  (+) Arthritis -,   Abdominal   Peds  Hematology negative hematology ROS (+)   Anesthesia Other Findings   Reproductive/Obstetrics                            Anesthesia Physical Anesthesia Plan  ASA: II  Anesthesia Plan: General   Post-op Pain Management:    Induction: Intravenous  Airway Management Planned: Oral ETT  Additional Equipment:   Intra-op Plan:   Post-operative Plan: Extubation in OR  Informed Consent: I have reviewed the patients History and Physical, chart, labs and discussed the procedure including the risks, benefits and alternatives for the proposed anesthesia with the patient or authorized representative who has indicated his/her understanding and acceptance.   Dental advisory given  Plan Discussed with: CRNA and Surgeon  Anesthesia Plan Comments:         Anesthesia Quick Evaluation

## 2014-02-21 NOTE — ED Notes (Signed)
Pt has returned from CT.  

## 2014-02-21 NOTE — Transfer of Care (Signed)
Immediate Anesthesia Transfer of Care Note  Patient: Susan Ford  Procedure(s) Performed: Procedure(s): LAPAROSCOPIC CHOLECYSTECTOMY WITH INTRAOPERATIVE CHOLANGIOGRAM (N/A)  Patient Location: PACU  Anesthesia Type:General  Level of Consciousness: awake, alert  and oriented  Airway & Oxygen Therapy: Patient Spontanous Breathing and Patient connected to face mask oxygen  Post-op Assessment: Report given to PACU RN  Post vital signs: Reviewed and stable  Complications: No apparent anesthesia complications

## 2014-02-22 ENCOUNTER — Encounter (HOSPITAL_COMMUNITY): Payer: Self-pay | Admitting: General Surgery

## 2014-02-22 DIAGNOSIS — K8000 Calculus of gallbladder with acute cholecystitis without obstruction: Secondary | ICD-10-CM | POA: Diagnosis not present

## 2014-02-22 DIAGNOSIS — I313 Pericardial effusion (noninflammatory): Secondary | ICD-10-CM | POA: Diagnosis not present

## 2014-02-22 DIAGNOSIS — R1084 Generalized abdominal pain: Secondary | ICD-10-CM | POA: Diagnosis not present

## 2014-02-22 DIAGNOSIS — K8030 Calculus of bile duct with cholangitis, unspecified, without obstruction: Secondary | ICD-10-CM | POA: Diagnosis not present

## 2014-02-22 DIAGNOSIS — M1990 Unspecified osteoarthritis, unspecified site: Secondary | ICD-10-CM | POA: Diagnosis present

## 2014-02-22 DIAGNOSIS — R932 Abnormal findings on diagnostic imaging of liver and biliary tract: Secondary | ICD-10-CM | POA: Diagnosis not present

## 2014-02-22 DIAGNOSIS — K859 Acute pancreatitis, unspecified: Secondary | ICD-10-CM | POA: Diagnosis not present

## 2014-02-22 DIAGNOSIS — K830 Cholangitis: Secondary | ICD-10-CM | POA: Diagnosis not present

## 2014-02-22 DIAGNOSIS — I484 Atypical atrial flutter: Secondary | ICD-10-CM | POA: Diagnosis not present

## 2014-02-22 DIAGNOSIS — K8050 Calculus of bile duct without cholangitis or cholecystitis without obstruction: Secondary | ICD-10-CM | POA: Diagnosis not present

## 2014-02-22 DIAGNOSIS — R945 Abnormal results of liver function studies: Secondary | ICD-10-CM | POA: Diagnosis not present

## 2014-02-22 DIAGNOSIS — Z7982 Long term (current) use of aspirin: Secondary | ICD-10-CM | POA: Diagnosis not present

## 2014-02-22 DIAGNOSIS — R7989 Other specified abnormal findings of blood chemistry: Secondary | ICD-10-CM | POA: Diagnosis present

## 2014-02-22 DIAGNOSIS — Z79899 Other long term (current) drug therapy: Secondary | ICD-10-CM | POA: Diagnosis not present

## 2014-02-22 DIAGNOSIS — K8042 Calculus of bile duct with acute cholecystitis without obstruction: Secondary | ICD-10-CM | POA: Diagnosis not present

## 2014-02-22 DIAGNOSIS — K219 Gastro-esophageal reflux disease without esophagitis: Secondary | ICD-10-CM | POA: Diagnosis present

## 2014-02-22 DIAGNOSIS — R109 Unspecified abdominal pain: Secondary | ICD-10-CM | POA: Diagnosis present

## 2014-02-22 DIAGNOSIS — Z888 Allergy status to other drugs, medicaments and biological substances status: Secondary | ICD-10-CM | POA: Diagnosis not present

## 2014-02-22 DIAGNOSIS — I483 Typical atrial flutter: Secondary | ICD-10-CM | POA: Diagnosis not present

## 2014-02-22 DIAGNOSIS — I319 Disease of pericardium, unspecified: Secondary | ICD-10-CM | POA: Diagnosis not present

## 2014-02-22 DIAGNOSIS — N390 Urinary tract infection, site not specified: Secondary | ICD-10-CM | POA: Diagnosis present

## 2014-02-22 DIAGNOSIS — D649 Anemia, unspecified: Secondary | ICD-10-CM | POA: Diagnosis present

## 2014-02-22 DIAGNOSIS — K8051 Calculus of bile duct without cholangitis or cholecystitis with obstruction: Secondary | ICD-10-CM | POA: Diagnosis not present

## 2014-02-22 DIAGNOSIS — I4892 Unspecified atrial flutter: Secondary | ICD-10-CM | POA: Diagnosis not present

## 2014-02-22 DIAGNOSIS — G609 Hereditary and idiopathic neuropathy, unspecified: Secondary | ICD-10-CM | POA: Diagnosis present

## 2014-02-22 DIAGNOSIS — K567 Ileus, unspecified: Secondary | ICD-10-CM | POA: Diagnosis not present

## 2014-02-22 DIAGNOSIS — I4891 Unspecified atrial fibrillation: Secondary | ICD-10-CM | POA: Diagnosis present

## 2014-02-22 LAB — CBC WITH DIFFERENTIAL/PLATELET
Basophils Absolute: 0 10*3/uL (ref 0.0–0.1)
Basophils Relative: 0 % (ref 0–1)
Eosinophils Absolute: 0 10*3/uL (ref 0.0–0.7)
Eosinophils Relative: 0 % (ref 0–5)
HCT: 36.1 % (ref 36.0–46.0)
Hemoglobin: 12.1 g/dL (ref 12.0–15.0)
Lymphocytes Relative: 8 % — ABNORMAL LOW (ref 12–46)
Lymphs Abs: 1.2 10*3/uL (ref 0.7–4.0)
MCH: 30 pg (ref 26.0–34.0)
MCHC: 33.5 g/dL (ref 30.0–36.0)
MCV: 89.6 fL (ref 78.0–100.0)
Monocytes Absolute: 0.6 10*3/uL (ref 0.1–1.0)
Monocytes Relative: 4 % (ref 3–12)
Neutro Abs: 13.5 10*3/uL — ABNORMAL HIGH (ref 1.7–7.7)
Neutrophils Relative %: 88 % — ABNORMAL HIGH (ref 43–77)
Platelets: 194 10*3/uL (ref 150–400)
RBC: 4.03 MIL/uL (ref 3.87–5.11)
RDW: 12.9 % (ref 11.5–15.5)
WBC: 15.3 10*3/uL — ABNORMAL HIGH (ref 4.0–10.5)

## 2014-02-22 LAB — COMPREHENSIVE METABOLIC PANEL
ALT: 302 U/L — ABNORMAL HIGH (ref 0–35)
AST: 257 U/L — ABNORMAL HIGH (ref 0–37)
Albumin: 2.9 g/dL — ABNORMAL LOW (ref 3.5–5.2)
Alkaline Phosphatase: 83 U/L (ref 39–117)
Anion gap: 12 (ref 5–15)
BUN: 12 mg/dL (ref 6–23)
CO2: 23 mEq/L (ref 19–32)
Calcium: 8.2 mg/dL — ABNORMAL LOW (ref 8.4–10.5)
Chloride: 99 mEq/L (ref 96–112)
Creatinine, Ser: 0.57 mg/dL (ref 0.50–1.10)
GFR calc Af Amer: >90 mL/min (ref >90)
GFR calc non Af Amer: 84 mL/min — ABNORMAL LOW (ref >90)
Glucose, Bld: 160 mg/dL — ABNORMAL HIGH (ref 70–99)
Potassium: 4 mEq/L (ref 3.7–5.3)
Sodium: 134 mEq/L — ABNORMAL LOW (ref 137–147)
Total Bilirubin: 0.7 mg/dL (ref 0.3–1.2)
Total Protein: 5.9 g/dL — ABNORMAL LOW (ref 6.0–8.3)

## 2014-02-22 LAB — LIPASE, BLOOD: Lipase: 13 U/L (ref 11–59)

## 2014-02-22 LAB — PROTIME-INR
INR: 1.21 (ref 0.00–1.49)
Prothrombin Time: 15.4 seconds — ABNORMAL HIGH (ref 11.6–15.2)

## 2014-02-22 MED ORDER — CEFTRIAXONE SODIUM 2 G IJ SOLR
2.0000 g | INTRAMUSCULAR | Status: DC
  Administered 2014-02-22 – 2014-02-26 (×4): 2 g via INTRAVENOUS
  Filled 2014-02-22 (×7): qty 2

## 2014-02-22 NOTE — Progress Notes (Signed)
[facility] PROGRESS NOTE Subjective patient still having pain in nausea. Still afebrile pulse rate elevated  Objective: Vital signs in last 24 hours: Temp:  [97.5 F (36.4 C)-99 F (37.2 C)] 99 F (37.2 C) (10/30 0559) Pulse Rate:  [62-106] 106 (10/30 0559) Resp:  [12-21] 18 (10/29 1722) BP: (116-151)/(56-79) 122/63 mmHg (10/30 0559) SpO2:  [79 %-97 %] 92 % (10/30 0559) Last BM Date: 02/20/14  Intake/Output from previous day: 10/29 0701 - 10/30 0700 In: 3233 [P.O.:310; I.V.:2923] Out: 275 [Urine:250; Blood:25] Intake/Output this shift:    PE: General--patient appears uncomfortable Heart-- regular rate and rhythm without murmurs or gallops Lungs--clear Abdomen-- few bowel sounds still quite tender  Lab Results:  Recent Labs  02/20/14 2253 02/21/14 0505 02/22/14 0428  WBC 12.4* 15.6* 15.3*  HGB 14.3 13.0 12.1  HCT 41.3 37.6 36.1  PLT 259 223 194   BMET  Recent Labs  02/20/14 2253 02/21/14 0505 02/22/14 0428  NA 136*  --  134*  K 4.0  --  4.0  CL 94*  --  99  CO2 25  --  23  CREATININE 0.71 0.66 0.57   LFT  Recent Labs  02/20/14 2253 02/22/14 0428  PROT 7.5 5.9*  AST 20 257*  ALT 15 302*  ALKPHOS 81 83  BILITOT 0.4 0.7   PT/INR No results found for this basename: LABPROT, INR,  in the last 72 hours PANCREAS  Recent Labs  02/20/14 2253 02/22/14 0428  LIPASE 66* 13         Studies/Results: Dg Cholangiogram Operative  02/21/2014   CLINICAL DATA:  Cholecystitis.  EXAM: INTRAOPERATIVE CHOLANGIOGRAM  TECHNIQUE: Cholangiographic images from the C-arm fluoroscopic device were submitted for interpretation post-operatively. Please see the procedural report for the amount of contrast and the fluoroscopy time utilized.  COMPARISON:  CT scan of same day.  FINDINGS: Under fluoroscopy, contrast was injected through cannulated cystic duct remnant. Filling of the intrahepatic and extrahepatic biliary ducts is noted. Multiple rounded filling  defects are noted in the distal common bile duct concerning for gallstones. These appear to be completely obstructive as no filling of the duodenum is noted.  IMPRESSION: Residual stones are noted in the distal common bile duct resulting in obstruction of the common bile duct. No filling of the duodenum is noted.   Electronically Signed   By: Sabino Dick M.D.   On: 02/21/2014 12:08   Ct Abdomen Pelvis W Contrast  02/21/2014   CLINICAL DATA:  Epigastric pain with emesis.  EXAM: CT ABDOMEN AND PELVIS WITH CONTRAST  TECHNIQUE: Multidetector CT imaging of the abdomen and pelvis was performed using the standard protocol following bolus administration of intravenous contrast.  CONTRAST:  139mL OMNIPAQUE IOHEXOL 300 MG/ML  SOLN  COMPARISON:  07/14/2009  FINDINGS: BODY WALL: Unremarkable.  LOWER CHEST: Small sliding-type hiatal hernia. There is nonspecific reticulation in the lower lungs.  ABDOMEN/PELVIS:  Liver: The liver has a lobulated surface and there is mild widening of the hepatic fissures. The surface nodularity is new or increased from 2011. No focal lesion.  Biliary: Gallbladder wall thickening and pericholecystic edema. There are stones layering within the gallbladder. The cystic duct has avid mucosal enhancement and likely contains stones. Best seen on reformatted imaging there is moderate suspicion for a 3 mm stone in the distal common bile duct.  Pancreas: No ductal enlargement or inflammatory change.  Spleen: Unremarkable.  Adrenals: Unremarkable.  Kidneys and ureters: No hydronephrosis or stone.  Bladder: Unremarkable.  Reproductive: Unremarkable.  Bowel: No bowel obstruction. Distal colonic diverticulosis. No pericecal inflammation. Small sliding-type hiatal hernia. Duodenal diverticula are present and uninflamed.  Retroperitoneum: No mass or adenopathy.  Peritoneum: No ascites or pneumoperitoneum.  Vascular: No acute abnormality.  OSSEOUS: Severe and diffuse degenerative disc change Remote appearing  T11 compression fracture.  IMPRESSION: 1. Findings consistent with acute cholecystitis. 2. Moderate suspicion for choledocholithiasis. 3. Morphologic changes suggesting cirrhosis. Correlate for risk factors.   Electronically Signed   By: Jorje Guild M.D.   On: 02/21/2014 03:38    Medications: I have reviewed the patient's current medications.  Assessment/Plan: 1. CBD stone with elevated liver tests and WBC elevated. Appears patient clearly is developing biliary obstruction. She is on IV Cipro and will continue that. Long discussion with her about ERCP. We discussed this yesterday with her family and discussed again this morning. We will keep her NPO except for ice chips and hopefully get this scheduled today or 1st thing in the morning. We're trying to coordinate with anesthesia. Dr. Paulita Fujita will be performing the procedure. All of these things including the risk of bleeding pancreatitis etc. have been discussed with the patient.   Ruford Dudzinski JR,Vashon Riordan L 02/22/2014, 8:44 AM

## 2014-02-22 NOTE — Progress Notes (Signed)
[facility] Progress Note  1 Day Post-Op  Subjective: Pt doing much better, more alert.  Son and daughter in law at bedside.  No N/V, NPO for possible ERCP today.  Mobilizing OOB.    Objective: Vital signs in last 24 hours: Temp:  [97.5 F (36.4 C)-99 F (37.2 C)] 99 F (37.2 C) (10/30 0559) Pulse Rate:  [62-106] 106 (10/30 0559) Resp:  [12-21] 18 (10/29 1722) BP: (116-151)/(56-79) 122/63 mmHg (10/30 0559) SpO2:  [79 %-97 %] 92 % (10/30 0559) Last BM Date: 02/20/14  Intake/Output from previous day: 10/29 0701 - 10/30 0700 In: 3233 [P.O.:310; I.V.:2923] Out: 275 [Urine:250; Blood:25] Intake/Output this shift:    PE: Gen:  Alert, NAD, pleasant Abd: Soft, tender in epigastrium/RUQ, +BS, no HSM, incisions C/D/I,    Lab Results:   Recent Labs  02/21/14 0505 02/22/14 0428  WBC 15.6* 15.3*  HGB 13.0 12.1  HCT 37.6 36.1  PLT 223 194   BMET  Recent Labs  02/20/14 2253 02/21/14 0505 02/22/14 0428  NA 136*  --  134*  K 4.0  --  4.0  CL 94*  --  99  CO2 25  --  23  GLUCOSE 147*  --  160*  BUN 18  --  12  CREATININE 0.71 0.66 0.57  CALCIUM 10.4  --  8.2*   PT/INR No results found for this basename: LABPROT, INR,  in the last 72 hours CMP     Component Value Date/Time   NA 134* 02/22/2014 0428   K 4.0 02/22/2014 0428   CL 99 02/22/2014 0428   CO2 23 02/22/2014 0428   GLUCOSE 160* 02/22/2014 0428   BUN 12 02/22/2014 0428   CREATININE 0.57 02/22/2014 0428   CALCIUM 8.2* 02/22/2014 0428   PROT 5.9* 02/22/2014 0428   ALBUMIN 2.9* 02/22/2014 0428   AST 257* 02/22/2014 0428   ALT 302* 02/22/2014 0428   ALKPHOS 83 02/22/2014 0428   BILITOT 0.7 02/22/2014 0428   GFRNONAA 84* 02/22/2014 0428   GFRAA >90 02/22/2014 0428   Lipase     Component Value Date/Time   LIPASE 13 02/22/2014 0428       Studies/Results: Dg Cholangiogram Operative  02/21/2014   CLINICAL DATA:  Cholecystitis.  EXAM: INTRAOPERATIVE CHOLANGIOGRAM  TECHNIQUE:  Cholangiographic images from the C-arm fluoroscopic device were submitted for interpretation post-operatively. Please see the procedural report for the amount of contrast and the fluoroscopy time utilized.  COMPARISON:  CT scan of same day.  FINDINGS: Under fluoroscopy, contrast was injected through cannulated cystic duct remnant. Filling of the intrahepatic and extrahepatic biliary ducts is noted. Multiple rounded filling defects are noted in the distal common bile duct concerning for gallstones. These appear to be completely obstructive as no filling of the duodenum is noted.  IMPRESSION: Residual stones are noted in the distal common bile duct resulting in obstruction of the common bile duct. No filling of the duodenum is noted.   Electronically Signed   By: Sabino Dick M.D.   On: 02/21/2014 12:08   Ct Abdomen Pelvis W Contrast  02/21/2014   CLINICAL DATA:  Epigastric pain with emesis.  EXAM: CT ABDOMEN AND PELVIS WITH CONTRAST  TECHNIQUE: Multidetector CT imaging of the abdomen and pelvis was performed using the standard protocol following bolus administration of intravenous contrast.  CONTRAST:  14mL OMNIPAQUE IOHEXOL 300 MG/ML  SOLN  COMPARISON:  07/14/2009  FINDINGS: BODY WALL: Unremarkable.  LOWER CHEST: Small sliding-type hiatal hernia. There is nonspecific reticulation in  the lower lungs.  ABDOMEN/PELVIS:  Liver: The liver has a lobulated surface and there is mild widening of the hepatic fissures. The surface nodularity is new or increased from 2011. No focal lesion.  Biliary: Gallbladder wall thickening and pericholecystic edema. There are stones layering within the gallbladder. The cystic duct has avid mucosal enhancement and likely contains stones. Best seen on reformatted imaging there is moderate suspicion for a 3 mm stone in the distal common bile duct.  Pancreas: No ductal enlargement or inflammatory change.  Spleen: Unremarkable.  Adrenals: Unremarkable.  Kidneys and ureters: No  hydronephrosis or stone.  Bladder: Unremarkable.  Reproductive: Unremarkable.  Bowel: No bowel obstruction. Distal colonic diverticulosis. No pericecal inflammation. Small sliding-type hiatal hernia. Duodenal diverticula are present and uninflamed.  Retroperitoneum: No mass or adenopathy.  Peritoneum: No ascites or pneumoperitoneum.  Vascular: No acute abnormality.  OSSEOUS: Severe and diffuse degenerative disc change Remote appearing T11 compression fracture.  IMPRESSION: 1. Findings consistent with acute cholecystitis. 2. Moderate suspicion for choledocholithiasis. 3. Morphologic changes suggesting cirrhosis. Correlate for risk factors.   Electronically Signed   By: Jorje Guild M.D.   On: 02/21/2014 03:38    Anti-infectives: Anti-infectives   Start     Dose/Rate Route Frequency Ordered Stop   02/21/14 0800  [MAR Hold]  cefOXitin (MEFOXIN) 1 g in dextrose 5 % 50 mL IVPB     (On MAR Hold since 02/21/14 0945)   1 g 100 mL/hr over 30 Minutes Intravenous On call to O.R. 02/21/14 0748 02/21/14 1034   02/21/14 0600  ciprofloxacin (CIPRO) IVPB 400 mg     400 mg 200 mL/hr over 60 Minutes Intravenous Every 12 hours 02/21/14 0513     02/21/14 0345  cefTRIAXone (ROCEPHIN) 1 g in dextrose 5 % 50 mL IVPB     1 g 100 mL/hr over 30 Minutes Intravenous  Once 02/21/14 0337 02/21/14 0558   02/21/14 0115  cefTRIAXone (ROCEPHIN) 1 g in dextrose 5 % 50 mL IVPB     1 g 100 mL/hr over 30 Minutes Intravenous  Once 02/21/14 0111 02/21/14 0315       Assessment/Plan Cholecystitis with CBD stone POD #1 s/p lap chole with IOC Choledocholithiasis Leukocytosis Transaminitis  Plan: 1.  GI's Dr. Oletta Lamas following, Dr. Paulita Fujita planning ERCP today or tomorrow am 2.  NPO, pain control, antiemetics 3.  IV antibiotics (Cipro Day 2), consider switching to Rocephin which has better coverage for e.coli 4.  Ambulate and IS 5.  SCD's and dvt proph on hold for possible ERCP today    LOS: 1 day    DORT,  Shariq Puig 02/22/2014, 8:55 AM Pager: [phone]

## 2014-02-22 NOTE — Progress Notes (Signed)
Ercp scheduled at 8 am tomorrow tenatively  Will broaden AB coverage due to cholangitis

## 2014-02-22 NOTE — Progress Notes (Signed)
For ERCP today.  Susan Ford. [name], MD, [place] [phone] [facility]

## 2014-02-23 ENCOUNTER — Encounter (HOSPITAL_COMMUNITY): Payer: Self-pay | Admitting: Certified Registered Nurse Anesthetist

## 2014-02-23 ENCOUNTER — Encounter (HOSPITAL_COMMUNITY): Admission: EM | Disposition: A | Discharge status: Home Health | Payer: Medicare Other | Source: Home / Self Care

## 2014-02-23 ENCOUNTER — Inpatient Hospital Stay (HOSPITAL_COMMUNITY): Payer: Medicare Other | Admitting: Certified Registered Nurse Anesthetist

## 2014-02-23 ENCOUNTER — Encounter (HOSPITAL_COMMUNITY): Payer: Medicare Other | Admitting: Certified Registered Nurse Anesthetist

## 2014-02-23 DIAGNOSIS — I4892 Unspecified atrial flutter: Secondary | ICD-10-CM | POA: Diagnosis not present

## 2014-02-23 LAB — CBC WITH DIFFERENTIAL/PLATELET
Basophils Absolute: 0 10*3/uL (ref 0.0–0.1)
Basophils Relative: 0 % (ref 0–1)
Eosinophils Absolute: 0 10*3/uL (ref 0.0–0.7)
Eosinophils Relative: 0 % (ref 0–5)
HCT: 35.9 % — ABNORMAL LOW (ref 36.0–46.0)
Hemoglobin: 12.1 g/dL (ref 12.0–15.0)
Lymphocytes Relative: 8 % — ABNORMAL LOW (ref 12–46)
Lymphs Abs: 1 10*3/uL (ref 0.7–4.0)
MCH: 30.5 pg (ref 26.0–34.0)
MCHC: 33.7 g/dL (ref 30.0–36.0)
MCV: 90.4 fL (ref 78.0–100.0)
Monocytes Absolute: 0.8 10*3/uL (ref 0.1–1.0)
Monocytes Relative: 7 % (ref 3–12)
Neutro Abs: 10.6 10*3/uL — ABNORMAL HIGH (ref 1.7–7.7)
Neutrophils Relative %: 85 % — ABNORMAL HIGH (ref 43–77)
Platelets: 198 10*3/uL (ref 150–400)
RBC: 3.97 MIL/uL (ref 3.87–5.11)
RDW: 13.1 % (ref 11.5–15.5)
WBC: 12.4 10*3/uL — ABNORMAL HIGH (ref 4.0–10.5)

## 2014-02-23 LAB — COMPREHENSIVE METABOLIC PANEL
ALT: 147 U/L — ABNORMAL HIGH (ref 0–35)
AST: 55 U/L — ABNORMAL HIGH (ref 0–37)
Albumin: 2.5 g/dL — ABNORMAL LOW (ref 3.5–5.2)
Alkaline Phosphatase: 115 U/L (ref 39–117)
Anion gap: 12 (ref 5–15)
BUN: 11 mg/dL (ref 6–23)
CO2: 26 mEq/L (ref 19–32)
Calcium: 8.4 mg/dL (ref 8.4–10.5)
Chloride: 95 mEq/L — ABNORMAL LOW (ref 96–112)
Creatinine, Ser: 0.57 mg/dL (ref 0.50–1.10)
GFR calc Af Amer: >90 mL/min (ref >90)
GFR calc non Af Amer: 84 mL/min — ABNORMAL LOW (ref >90)
Glucose, Bld: 124 mg/dL — ABNORMAL HIGH (ref 70–99)
Potassium: 3.8 mEq/L (ref 3.7–5.3)
Sodium: 133 mEq/L — ABNORMAL LOW (ref 137–147)
Total Bilirubin: 0.4 mg/dL (ref 0.3–1.2)
Total Protein: 5.9 g/dL — ABNORMAL LOW (ref 6.0–8.3)

## 2014-02-23 LAB — LIPASE, BLOOD: Lipase: 20 U/L (ref 11–59)

## 2014-02-23 LAB — URINE CULTURE: Colony Count: >=100000

## 2014-02-23 SURGERY — Surgical Case

## 2014-02-23 MED ORDER — DILTIAZEM HCL 100 MG IV SOLR
5.0000 mg/h | INTRAVENOUS | Status: DC
  Administered 2014-02-23 – 2014-02-25 (×3): 5 mg/h via INTRAVENOUS
  Filled 2014-02-23 (×5): qty 100

## 2014-02-23 MED ORDER — DILTIAZEM LOAD VIA INFUSION
10.0000 mg | Freq: Once | INTRAVENOUS | Status: AC
  Administered 2014-02-23: 10 mg via INTRAVENOUS
  Filled 2014-02-23: qty 10

## 2014-02-23 MED ORDER — SODIUM CHLORIDE 0.9 % IV SOLN: INTRAVENOUS | Status: DC

## 2014-02-23 NOTE — Progress Notes (Signed)
Patient ID: Susan Ford, female   DOB: 1929/06/29, 80 y.o.   MRN: [id number]  General Surgery - [facility] - Progress Note  POD# 2  Subjective: Patient awake, NPO this AM.  Awaiting ERCP per GI this AM.  Mild abdominal pain.  Objective: Vital signs in last 24 hours: Temp:  [98.4 F (36.9 C)-99.6 F (37.6 C)] 98.4 F (36.9 C) (10/31 0619) Pulse Rate:  [88-104] 88 (10/31 0619) Resp:  [17-18] 17 (10/31 0619) BP: (123-132)/(60-70) 128/70 mmHg (10/31 0619) SpO2:  [90 %-93 %] 93 % (10/31 0619) Last BM Date: 02/20/14  Intake/Output from previous day: 10/30 0701 - 10/31 0700 In: 1650 [I.V.:1600; IV Piggyback:50] Out: -   Exam: HEENT - clear, not icteric Neck - soft Chest - clear bilaterally Cor - RRR, no murmur Abd - mild distension; wounds clear and dry with dressings Ext - no significant edema Neuro - grossly intact, no focal deficits  Lab Results:   Recent Labs  02/21/14 0505 02/22/14 0428  WBC 15.6* 15.3*  HGB 13.0 12.1  HCT 37.6 36.1  PLT 223 194     Recent Labs  02/20/14 2253 02/21/14 0505 02/22/14 0428  NA 136*  --  134*  K 4.0  --  4.0  CL 94*  --  99  CO2 25  --  23  GLUCOSE 147*  --  160*  BUN 18  --  12  CREATININE 0.71 0.66 0.57  CALCIUM 10.4  --  8.2*    Studies/Results: Dg Cholangiogram Operative  02/21/2014   CLINICAL DATA:  Cholecystitis.  EXAM: INTRAOPERATIVE CHOLANGIOGRAM  TECHNIQUE: Cholangiographic images from the C-arm fluoroscopic device were submitted for interpretation post-operatively. Please see the procedural report for the amount of contrast and the fluoroscopy time utilized.  COMPARISON:  CT scan of same day.  FINDINGS: Under fluoroscopy, contrast was injected through cannulated cystic duct remnant. Filling of the intrahepatic and extrahepatic biliary ducts is noted. Multiple rounded filling defects are noted in the distal common bile duct concerning for gallstones. These appear to be completely  obstructive as no filling of the duodenum is noted.  IMPRESSION: Residual stones are noted in the distal common bile duct resulting in obstruction of the common bile duct. No filling of the duodenum is noted.   Electronically Signed   By: Sabino Dick M.D.   On: 02/21/2014 12:08    Assessment / Plan: 1.  Status post lap chole  ERCP today for CBD stones per GI  NPO for procedure  Abx per GI  Earnstine Regal, MD, [facility] Office: [phone]  02/23/2014

## 2014-02-23 NOTE — Progress Notes (Signed)
Procedure canceled per DR Ola Spurr and Dr Paulita Fujita.

## 2014-02-23 NOTE — Consult Note (Signed)
CARDIOLOGY CONSULT NOTE   Patient ID: Susan Ford MRN: [id number], DOB/AGE: 1929-06-12   Admit date: 02/21/2014 Date of Consult: 02/23/2014   Primary Physician: Lottie Dawson, MD Primary Cardiologist: none  Pt. Profile  80 year old woman went to atrial flutter fibrillation with rapid ventricular response earlier today while awaiting ERCP  Problem List  Past Medical History  Diagnosis Date  . GERD (gastroesophageal reflux disease)     Had endoscopy no history of Barrett's  . Arthritis   . Cataract 2007    bilateral cat ext   . Unspecified hereditary and idiopathic peripheral neuropathy   . Cholecystitis     Past Surgical History  Procedure Laterality Date  . Abdominal hysterectomy      Nine reasons  . Tonsillectomy    . Lumbar laminectomy/decompression microdiscectomy  02/01/2012    Procedure: LUMBAR LAMINECTOMY/DECOMPRESSION MICRODISCECTOMY 2 LEVELS;  Surgeon: Kristeen Miss, MD;  Location: [facility];  Service: Neurosurgery;  Laterality: Left;  Left Lumbar Four-Five Lumbar Five-Sacral One Laminectomies/Foraminotomies/Microscope  . Cholecystectomy N/A 02/21/2014    Procedure: LAPAROSCOPIC CHOLECYSTECTOMY WITH INTRAOPERATIVE CHOLANGIOGRAM;  Surgeon: Doreen Salvage, MD;  Location: [place];  Service: General;  Laterality: N/A;     Allergies  Allergies  Allergen Reactions  . Lyrica [Pregabalin] Other (See Comments)    "High Fever"   . Oxytetracycline Other (See Comments)    "High Fever"    HPI   This pleasant 80 year old woman went into atrial flutter fibrillation while in the endoscopy suite awaiting ERCP.  The patient does not have any prior history of known atrial flutter fibrillation.  She states that today she was not aware of her heart being out of rhythm.  The patient does not have any history of ischemic heart disease.  She denies any history of diabetes or hypertension or previous stroke. She is familiar with atrial fibrillation because her husband  died of what she referred to as amiodarone toxicity of the lung.  Inpatient Medications  . cefTRIAXone (ROCEPHIN)  IV  2 g Intravenous Q24H  . gabapentin  600 mg Oral QID    Family History Family History  Problem Relation Age of Onset  . Neurodegenerative disease Mother     Mikey Bussing Drager died at 75  . Alcohol abuse    . Heart disease       Social History History   Social History  . Marital Status: Married    Spouse Name: Gwyndolyn Saxon    Number of Children: N/A  . Years of Education: N/A   Occupational History  . Retired     Therapist, sports   Social History Main Topics  . Smoking status: Never Smoker   . Smokeless tobacco: Never Used  . Alcohol Use: 4.2 oz/week    7 Glasses of wine per week  . Drug Use: No  . Sexual Activity: Yes   Other Topics Concern  . Not on file   Social History Narrative   Retired Therapist, sports    married   [place] of 2    Regular exercise former smoker   G3P3           Review of Systems  General:  No chills, fever, night sweats or weight changes.  Cardiovascular:  No chest pain, dyspnea on exertion, edema, orthopnea, palpitations, paroxysmal nocturnal dyspnea. Dermatological: No rash, lesions/masses Respiratory: No cough, dyspnea Urologic: No hematuria, dysuria Abdominal:   No nausea, vomiting, diarrhea, bright red blood per rectum, melena, or hematemesis Neurologic:  No visual changes, wkns, changes in mental  status. All other systems reviewed and are otherwise negative except as noted above.  Physical Exam  Blood pressure 120/58, pulse 101, temperature 99.5 F (37.5 C), temperature source Oral, resp. rate 16, height 5\' 4"  (1.626 m), weight 139 lb 9.6 oz (63.322 kg), SpO2 93.00%.  General: Pleasant, NAD Psych: Normal affect. Neuro: Alert and oriented X 3. Moves all extremities spontaneously. HEENT: Normal  Neck: Supple without bruits or JVD. Lungs:  Resp regular and unlabored, CTA. Heart: RRR no s3, s4, or murmurs. Abdomen: Soft, mildly tender,  non-distended, BS + x 4.  Laparoscopy incisions appear to be healing well. Extremities: No clubbing, cyanosis or edema. DP/PT/Radials 2+ and equal bilaterally.  Labs  No results found for this basename: CKTOTAL, CKMB, TROPONINI,  in the last 72 hours Lab Results  Component Value Date   WBC 12.4* 02/23/2014   HGB 12.1 02/23/2014   HCT 35.9* 02/23/2014   MCV 90.4 02/23/2014   PLT 198 02/23/2014     Recent Labs Lab 02/23/14 1313  NA 133*  K 3.8  CL 95*  CO2 26  BUN 11  CREATININE 0.57  CALCIUM 8.4  PROT 5.9*  BILITOT 0.4  ALKPHOS 115  ALT 147*  AST 55*  GLUCOSE 124*   Lab Results  Component Value Date   CHOL 179 10/23/2013   HDL 70.10 10/23/2013   LDLCALC 68 10/23/2013   TRIG 203.0* 10/23/2013   No results found for this basename: DDIMER    Radiology/Studies  Dg Cholangiogram Operative  02/21/2014   CLINICAL DATA:  Cholecystitis.  EXAM: INTRAOPERATIVE CHOLANGIOGRAM  TECHNIQUE: Cholangiographic images from the C-arm fluoroscopic device were submitted for interpretation post-operatively. Please see the procedural report for the amount of contrast and the fluoroscopy time utilized.  COMPARISON:  CT scan of same day.  FINDINGS: Under fluoroscopy, contrast was injected through cannulated cystic duct remnant. Filling of the intrahepatic and extrahepatic biliary ducts is noted. Multiple rounded filling defects are noted in the distal common bile duct concerning for gallstones. These appear to be completely obstructive as no filling of the duodenum is noted.  IMPRESSION: Residual stones are noted in the distal common bile duct resulting in obstruction of the common bile duct. No filling of the duodenum is noted.   Electronically Signed   By: Sabino Dick M.D.   On: 02/21/2014 12:08   Ct Abdomen Pelvis W Contrast  02/21/2014   CLINICAL DATA:  Epigastric pain with emesis.  EXAM: CT ABDOMEN AND PELVIS WITH CONTRAST  TECHNIQUE: Multidetector CT imaging of the abdomen and pelvis was  performed using the standard protocol following bolus administration of intravenous contrast.  CONTRAST:  13mL OMNIPAQUE IOHEXOL 300 MG/ML  SOLN  COMPARISON:  07/14/2009  FINDINGS: BODY WALL: Unremarkable.  LOWER CHEST: Small sliding-type hiatal hernia. There is nonspecific reticulation in the lower lungs.  ABDOMEN/PELVIS:  Liver: The liver has a lobulated surface and there is mild widening of the hepatic fissures. The surface nodularity is new or increased from 2011. No focal lesion.  Biliary: Gallbladder wall thickening and pericholecystic edema. There are stones layering within the gallbladder. The cystic duct has avid mucosal enhancement and likely contains stones. Best seen on reformatted imaging there is moderate suspicion for a 3 mm stone in the distal common bile duct.  Pancreas: No ductal enlargement or inflammatory change.  Spleen: Unremarkable.  Adrenals: Unremarkable.  Kidneys and ureters: No hydronephrosis or stone.  Bladder: Unremarkable.  Reproductive: Unremarkable.  Bowel: No bowel obstruction. Distal colonic diverticulosis. No pericecal inflammation.  Small sliding-type hiatal hernia. Duodenal diverticula are present and uninflamed.  Retroperitoneum: No mass or adenopathy.  Peritoneum: No ascites or pneumoperitoneum.  Vascular: No acute abnormality.  OSSEOUS: Severe and diffuse degenerative disc change Remote appearing T11 compression fracture.  IMPRESSION: 1. Findings consistent with acute cholecystitis. 2. Moderate suspicion for choledocholithiasis. 3. Morphologic changes suggesting cirrhosis. Correlate for risk factors.   Electronically Signed   By: Jorje Guild M.D.   On: 02/21/2014 03:38    ECG  Atrial flutter with variable A-V block Cannot rule out Anterior infarct , age undetermined Abnormal ECG Normal sinus rhythm NO LONGER PRESENT Confirmed by Claiborne Billings MD, Marcello Moores ([id number]) on 02/23/2014 12:17:40 PM  ASSESSMENT AND PLAN  1.  Paroxysmal atrial flutter fibrillation 2.  Recent  laparoscopic cholecystectomy with retained common duct stones awaiting ERCP 3.  Elevated LFTs  Disposition: The patient has gone back into normal sinus rhythm on IV diltiazem.  We will continue IV diltiazem overnight and while undergoing ERCP and then postoperatively transition her to oral diltiazem.  We will check an echocardiogram for left ventricular function. Okay to plan for ERCP on 02/24/14 morning. Her chadssvasc score is 63 (female sex, age greater than 42) but with no other vascular or cardiac risk factors we will not commit her to long-term anticoagulation at this point.  After discharge home she would benefit from a 30 day event monitor to look for additional evidence of atrial fibrillation.  Today's episode may have been secondary to preoperative stress.   Signed, Darlin Coco, MD  02/23/2014, 3:07 PM

## 2014-02-23 NOTE — Progress Notes (Signed)
Patient ID: Susan Ford, female   DOB: 1929/12/15, 80 y.o.   MRN: [id number]  [facility]  Patient with new onset of atrial flutter in holding area of endoscopy prior to ERCP.  No previous history of arrhythmia.  Patient comfortable in holding area.  Awake and oriented.  Family at bedside.  On monitor with rate 148.  Will order cardizem infusion stat.  Transfer to telemetry unit.  Cardiology consult requested.  Earnstine Regal, MD, [facility] Office: [phone]

## 2014-02-23 NOTE — Progress Notes (Signed)
Subjective: New onset tachycardia. Mild abdominal pain.  Objective: Vital signs in last 24 hours: Temp:  [98.3 F (36.8 C)-99.6 F (37.6 C)] 98.3 F (36.8 C) (10/31 0940) Pulse Rate:  [88-104] 88 (10/31 0619) Resp:  [17-18] 17 (10/31 0619) BP: (119-132)/(60-70) 119/68 mmHg (10/31 0940) SpO2:  [88 %-93 %] 88 % (10/31 0940) Weight change:  Last BM Date: 02/21/14  PE: GEN:  NAD HEENT:  Sclera anicteric ABD:  Mild diffuse tenderness, active bowel sounds.  Lab Results: CBC    Component Value Date/Time   WBC 15.3* 02/22/2014 0428   RBC 4.03 02/22/2014 0428   HGB 12.1 02/22/2014 0428   HCT 36.1 02/22/2014 0428   PLT 194 02/22/2014 0428   MCV 89.6 02/22/2014 0428   MCH 30.0 02/22/2014 0428   MCHC 33.5 02/22/2014 0428   RDW 12.9 02/22/2014 0428   LYMPHSABS 1.2 02/22/2014 0428   MONOABS 0.6 02/22/2014 0428   EOSABS 0.0 02/22/2014 0428   BASOSABS 0.0 02/22/2014 0428   CMP     Component Value Date/Time   NA 134* 02/22/2014 0428   K 4.0 02/22/2014 0428   CL 99 02/22/2014 0428   CO2 23 02/22/2014 0428   GLUCOSE 160* 02/22/2014 0428   BUN 12 02/22/2014 0428   CREATININE 0.57 02/22/2014 0428   CALCIUM 8.2* 02/22/2014 0428   PROT 5.9* 02/22/2014 0428   ALBUMIN 2.9* 02/22/2014 0428   AST 257* 02/22/2014 0428   ALT 302* 02/22/2014 0428   ALKPHOS 83 02/22/2014 0428   BILITOT 0.7 02/22/2014 0428   GFRNONAA 84* 02/22/2014 0428   GFRAA >90 02/22/2014 0428   Assessment:  1.  Bile duct stones. 2.  Elevated LFTs. 3.  New onset atrial flutter.  Plan:  1.  Continue antibiotics and analgesics as needed. 2.  Hold off on ERCP for today, given her new onset rapid atrial fibrillation/flutter. 3.  Cardiology consult requested for management of atrial fibrillation. 4.  Repeat LFTs. 5.  Clear liquids OK, NPO after midnight. 6.  Tentative plan to retry ERCP tomorrow morning 02/24/14, if her cardiac condition stabilizes.   Landry Dyke 02/23/2014, 10:50 AM

## 2014-02-24 ENCOUNTER — Encounter (HOSPITAL_COMMUNITY): Admission: EM | Disposition: A | Discharge status: Home Health | Payer: Self-pay | Source: Home / Self Care

## 2014-02-24 ENCOUNTER — Inpatient Hospital Stay (HOSPITAL_COMMUNITY): Payer: Medicare Other

## 2014-02-24 ENCOUNTER — Inpatient Hospital Stay (HOSPITAL_COMMUNITY): Payer: Medicare Other | Admitting: Anesthesiology

## 2014-02-24 ENCOUNTER — Encounter (HOSPITAL_COMMUNITY): Payer: Self-pay | Admitting: Anesthesiology

## 2014-02-24 DIAGNOSIS — I484 Atypical atrial flutter: Secondary | ICD-10-CM

## 2014-02-24 DIAGNOSIS — I319 Disease of pericardium, unspecified: Secondary | ICD-10-CM

## 2014-02-24 DIAGNOSIS — K8000 Calculus of gallbladder with acute cholecystitis without obstruction: Secondary | ICD-10-CM

## 2014-02-24 HISTORY — PX: ERCP: SHX5425

## 2014-02-24 SURGERY — ERCP, WITH INTERVENTION IF INDICATED
Anesthesia: General

## 2014-02-24 MED ORDER — LIDOCAINE HCL (CARDIAC) 20 MG/ML IV SOLN
INTRAVENOUS | Status: DC | PRN
  Administered 2014-02-24: 20 mg via INTRAVENOUS

## 2014-02-24 MED ORDER — MIDAZOLAM HCL 2 MG/2ML IJ SOLN: 0.5000 mg | Freq: Once | INTRAMUSCULAR | Status: DC | PRN

## 2014-02-24 MED ORDER — CIPROFLOXACIN IN D5W 400 MG/200ML IV SOLN
INTRAVENOUS | Status: AC
  Filled 2014-02-24: qty 200

## 2014-02-24 MED ORDER — OXYCODONE HCL 5 MG/5ML PO SOLN: 5.0000 mg | Freq: Once | ORAL | Status: DC | PRN

## 2014-02-24 MED ORDER — PROPOFOL 10 MG/ML IV BOLUS
INTRAVENOUS | Status: DC | PRN
  Administered 2014-02-24: 100 mg via INTRAVENOUS

## 2014-02-24 MED ORDER — ONDANSETRON HCL 4 MG/2ML IJ SOLN
INTRAMUSCULAR | Status: DC | PRN
  Administered 2014-02-24: 4 mg via INTRAVENOUS

## 2014-02-24 MED ORDER — PROMETHAZINE HCL 25 MG/ML IJ SOLN: 6.2500 mg | INTRAMUSCULAR | Status: DC | PRN

## 2014-02-24 MED ORDER — CIPROFLOXACIN IN D5W 400 MG/200ML IV SOLN
INTRAVENOUS | Status: DC | PRN
  Administered 2014-02-24: 400 mg via INTRAVENOUS

## 2014-02-24 MED ORDER — GLUCAGON HCL RDNA (DIAGNOSTIC) 1 MG IJ SOLR
INTRAMUSCULAR | Status: AC
  Filled 2014-02-24: qty 1

## 2014-02-24 MED ORDER — SUCCINYLCHOLINE CHLORIDE 20 MG/ML IJ SOLN
INTRAMUSCULAR | Status: DC | PRN
  Administered 2014-02-24: 100 mg via INTRAVENOUS

## 2014-02-24 MED ORDER — DEXAMETHASONE SODIUM PHOSPHATE 4 MG/ML IJ SOLN
INTRAMUSCULAR | Status: DC | PRN
  Administered 2014-02-24: 4 mg via INTRAVENOUS

## 2014-02-24 MED ORDER — SODIUM CHLORIDE 0.9 % IV SOLN
INTRAVENOUS | Status: DC | PRN
  Administered 2014-02-24: 09:00:00 via INTRAVENOUS

## 2014-02-24 MED ORDER — GLUCAGON HCL RDNA (DIAGNOSTIC) 1 MG IJ SOLR
INTRAMUSCULAR | Status: DC | PRN
  Administered 2014-02-24 (×4): .5 mg via INTRAVENOUS

## 2014-02-24 MED ORDER — POTASSIUM CHLORIDE IN NACL 20-0.9 MEQ/L-% IV SOLN
INTRAVENOUS | Status: DC
  Administered 2014-02-25: 16:00:00 via INTRAVENOUS
  Filled 2014-02-24 (×3): qty 1000

## 2014-02-24 MED ORDER — FENTANYL CITRATE 0.05 MG/ML IJ SOLN: 25.0000 ug | INTRAMUSCULAR | Status: DC | PRN

## 2014-02-24 MED ORDER — OXYCODONE HCL 5 MG PO TABS: 5.0000 mg | ORAL_TABLET | Freq: Once | ORAL | Status: DC | PRN

## 2014-02-24 MED ORDER — MEPERIDINE HCL 100 MG/ML IJ SOLN: 6.2500 mg | INTRAMUSCULAR | Status: DC | PRN

## 2014-02-24 MED ORDER — DEXTROSE 5 % IV SOLN
10.0000 mg | INTRAVENOUS | Status: DC | PRN
  Administered 2014-02-24: 25 ug/min via INTRAVENOUS

## 2014-02-24 MED ORDER — SODIUM CHLORIDE 0.9 % IV SOLN
INTRAVENOUS | Status: DC | PRN
  Administered 2014-02-24: 11:00:00

## 2014-02-24 MED ORDER — FENTANYL CITRATE 0.05 MG/ML IJ SOLN
INTRAMUSCULAR | Status: DC | PRN
  Administered 2014-02-24: 100 ug via INTRAVENOUS
  Administered 2014-02-24 (×2): 50 ug via INTRAVENOUS

## 2014-02-24 SURGICAL SUPPLY — 2 items
Avanix biliary stent 8.5F-5 cm ×1 IMPLANT
pancreatic stent 5fr-3cm Hobb Medical ×1 IMPLANT

## 2014-02-24 NOTE — Progress Notes (Signed)
1 Day Post-Op  Subjective: She looks tired and is a little bloated on O2.  I can't tell what her real I/O is but she has some rales and fluid is saying she is 5.9 liters up.    Objective: Vital signs in last 24 hours: Temp:  [98.3 F (36.8 C)-99.5 F (37.5 C)] 98.8 F (37.1 C) (11/01 0456) Pulse Rate:  [101-138] 108 (11/01 0456) Resp:  [16-18] 18 (11/01 0456) BP: (110-139)/(58-74) 139/74 mmHg (11/01 0456) SpO2:  [88 %-94 %] 94 % (11/01 0456) Last BM Date: 02/21/14 PO 360 recorded Afebrile, VSS HR down in the 80's currently/irregular  Improving LFT's WBC improving IOC  -  Residual stones in distal CBD Intake/Output from previous day: 10/31 0701 - 11/01 0700 In: 410 [P.O.:360; IV Piggyback:50] Out: -  Intake/Output this shift:    General appearance: alert, cooperative and no distress Resp: rales in base  GI: soft, but distended some.  sites are OK  Lab Results:   Recent Labs  02/22/14 0428 02/23/14 1313  WBC 15.3* 12.4*  HGB 12.1 12.1  HCT 36.1 35.9*  PLT 194 198    BMET  Recent Labs  02/22/14 0428 02/23/14 1313  NA 134* 133*  K 4.0 3.8  CL 99 95*  CO2 23 26  GLUCOSE 160* 124*  BUN 12 11  CREATININE 0.57 0.57  CALCIUM 8.2* 8.4   PT/INR  Recent Labs  02/22/14 1140  LABPROT 15.4*  INR 1.21     Recent Labs Lab 02/20/14 2253 02/22/14 0428 02/23/14 1313  AST 20 257* 55*  ALT 15 302* 147*  ALKPHOS 81 83 115  BILITOT 0.4 0.7 0.4  PROT 7.5 5.9* 5.9*  ALBUMIN 4.3 2.9* 2.5*     Lipase     Component Value Date/Time   LIPASE 20 02/23/2014 1313     Studies/Results: No results found.  Medications: . cefTRIAXone (ROCEPHIN)  IV  2 g Intravenous Q24H  . gabapentin  600 mg Oral QID   . sodium chloride    . dextrose 5% lactated ringers 100 mL/hr at 02/23/14 0552  . diltiazem (CARDIZEM) infusion 5 mg/hr (02/24/14 0159)    Assessment/Plan 1.  Cholecystitis with CBD stones LAPAROSCOPIC CHOLECYSTECTOMY WITH INTRAOPERATIVE CHOLANGIOGRAM,  02/21/2014, Susan Salvage, MD.   [place] showing CBD stones 2.  Aflutter with RVR prior to ERCP 3.  GERD 4.  Unspecified hereditary and idiopathic peripheral neuropathy   Plan:  She looks like she is back in SR.  Still on  cardizem low dose.  She also looks a bit wet to me.  I will defer this to Cardiology, for now.  i will slow down her IV fluids.  She has a 2D echo ordered but I don't see a result yet.  Hopefull we can get the ERCP today.  She is still on Rocephin.     LOS: 3 days    Susan Ford 02/24/2014

## 2014-02-24 NOTE — H&P (View-Only) (Signed)
Subjective: New onset tachycardia. Mild abdominal pain.  Objective: Vital signs in last 24 hours: Temp:  [98.3 F (36.8 C)-99.6 F (37.6 C)] 98.3 F (36.8 C) (10/31 0940) Pulse Rate:  [88-104] 88 (10/31 0619) Resp:  [17-18] 17 (10/31 0619) BP: (119-132)/(60-70) 119/68 mmHg (10/31 0940) SpO2:  [88 %-93 %] 88 % (10/31 0940) Weight change:  Last BM Date: 02/21/14  PE: GEN:  NAD HEENT:  Sclera anicteric ABD:  Mild diffuse tenderness, active bowel sounds.  Lab Results: CBC    Component Value Date/Time   WBC 15.3* 02/22/2014 0428   RBC 4.03 02/22/2014 0428   HGB 12.1 02/22/2014 0428   HCT 36.1 02/22/2014 0428   PLT 194 02/22/2014 0428   MCV 89.6 02/22/2014 0428   MCH 30.0 02/22/2014 0428   MCHC 33.5 02/22/2014 0428   RDW 12.9 02/22/2014 0428   LYMPHSABS 1.2 02/22/2014 0428   MONOABS 0.6 02/22/2014 0428   EOSABS 0.0 02/22/2014 0428   BASOSABS 0.0 02/22/2014 0428   CMP     Component Value Date/Time   NA 134* 02/22/2014 0428   K 4.0 02/22/2014 0428   CL 99 02/22/2014 0428   CO2 23 02/22/2014 0428   GLUCOSE 160* 02/22/2014 0428   BUN 12 02/22/2014 0428   CREATININE 0.57 02/22/2014 0428   CALCIUM 8.2* 02/22/2014 0428   PROT 5.9* 02/22/2014 0428   ALBUMIN 2.9* 02/22/2014 0428   AST 257* 02/22/2014 0428   ALT 302* 02/22/2014 0428   ALKPHOS 83 02/22/2014 0428   BILITOT 0.7 02/22/2014 0428   GFRNONAA 84* 02/22/2014 0428   GFRAA >90 02/22/2014 0428   Assessment:  1.  Bile duct stones. 2.  Elevated LFTs. 3.  New onset atrial flutter.  Plan:  1.  Continue antibiotics and analgesics as needed. 2.  Hold off on ERCP for today, given her new onset rapid atrial fibrillation/flutter. 3.  Cardiology consult requested for management of atrial fibrillation. 4.  Repeat LFTs. 5.  Clear liquids OK, NPO after midnight. 6.  Tentative plan to retry ERCP tomorrow morning 02/24/14, if her cardiac condition stabilizes.   Landry Dyke 02/23/2014, 10:50 AM

## 2014-02-24 NOTE — Progress Notes (Signed)
  Echocardiogram 2D Echocardiogram has been performed.  Darlina Sicilian M 02/24/2014, 3:46 PM

## 2014-02-24 NOTE — Plan of Care (Signed)
Problem: Discharge Progression Outcomes Goal: Pain controlled with appropriate interventions Outcome: Progressing     

## 2014-02-24 NOTE — Progress Notes (Signed)
Patient ID: Susan Ford, female   DOB: 01/14/1930, 80 y.o.   MRN: [id number]   Patient Name: Susan Ford Date of Encounter: 02/24/2014     Principal Problem:   Acute calculous cholecystitis Active Problems:   Atrial flutter    SUBJECTIVE  S/p ERCP doing well and maintaining NSR.  CURRENT MEDS . cefTRIAXone (ROCEPHIN)  IV  2 g Intravenous Q24H  . gabapentin  600 mg Oral QID    OBJECTIVE  Filed Vitals:   02/24/14 1115 02/24/14 1130 02/24/14 1145 02/24/14 1200  BP: 129/56 136/66 130/69   Pulse: 96 92 97   Temp:    98.2 F (36.8 C)  TempSrc:      Resp: 17 17 21 14   Height:      Weight:      SpO2: 97% 96% 93%     Intake/Output Summary (Last 24 hours) at 02/24/14 1246 Last data filed at 02/24/14 1200  Gross per 24 hour  Intake   1160 ml  Output    250 ml  Net    910 ml   Filed Weights   02/21/14 0700  Weight: 139 lb 9.6 oz (63.322 kg)    PHYSICAL EXAM  General: Pleasant, NAD. Neuro: Alert and oriented X 3. Moves all extremities spontaneously. Psych: Normal affect. HEENT:  Normal  Neck: Supple without bruits or JVD. Lungs:  Resp regular and unlabored, CTA. Heart: RRR no s3, s4, or murmurs. Abdomen: Soft, non-tender, non-distended, BS + x 4.  Extremities: No clubbing, cyanosis or edema. DP/PT/Radials 2+ and equal bilaterally.  Accessory Clinical Findings  CBC  Recent Labs  02/22/14 0428 02/23/14 1313  WBC 15.3* 12.4*  NEUTROABS 13.5* 10.6*  HGB 12.1 12.1  HCT 36.1 35.9*  MCV 89.6 90.4  PLT 194 923   Basic Metabolic Panel  Recent Labs  02/22/14 0428 02/23/14 1313  NA 134* 133*  K 4.0 3.8  CL 99 95*  CO2 23 26  GLUCOSE 160* 124*  BUN 12 11  CREATININE 0.57 0.57  CALCIUM 8.2* 8.4   Liver Function Tests  Recent Labs  02/22/14 0428 02/23/14 1313  AST 257* 55*  ALT 302* 147*  ALKPHOS 83 115  BILITOT 0.7 0.4  PROT 5.9* 5.9*  ALBUMIN 2.9* 2.5*    Recent Labs  02/22/14 0428 02/23/14 1313  LIPASE 13 20   Cardiac  Enzymes No results for input(s): CKTOTAL, CKMB, CKMBINDEX, TROPONINI in the last 72 hours. BNP Invalid input(s): POCBNP D-Dimer No results for input(s): DDIMER in the last 72 hours. Hemoglobin A1C No results for input(s): HGBA1C in the last 72 hours. Fasting Lipid Panel No results for input(s): CHOL, HDL, LDLCALC, TRIG, CHOLHDL, LDLDIRECT in the last 72 hours. Thyroid Function Tests No results for input(s): TSH, T4TOTAL, T3FREE, THYROIDAB in the last 72 hours.  Invalid input(s): FREET3  TELE  NSR/ST with PAC's  Radiology/Studies  Dg Cholangiogram Operative  02/21/2014   CLINICAL DATA:  Cholecystitis.  EXAM: INTRAOPERATIVE CHOLANGIOGRAM  TECHNIQUE: Cholangiographic images from the C-arm fluoroscopic device were submitted for interpretation post-operatively. Please see the procedural report for the amount of contrast and the fluoroscopy time utilized.  COMPARISON:  CT scan of same day.  FINDINGS: Under fluoroscopy, contrast was injected through cannulated cystic duct remnant. Filling of the intrahepatic and extrahepatic biliary ducts is noted. Multiple rounded filling defects are noted in the distal common bile duct concerning for gallstones. These appear to be completely obstructive as no filling of the duodenum is noted.  IMPRESSION:  Residual stones are noted in the distal common bile duct resulting in obstruction of the common bile duct. No filling of the duodenum is noted.   Electronically Signed   By: Sabino Dick M.D.   On: 02/21/2014 12:08   Ct Abdomen Pelvis W Contrast  02/21/2014   CLINICAL DATA:  Epigastric pain with emesis.  EXAM: CT ABDOMEN AND PELVIS WITH CONTRAST  TECHNIQUE: Multidetector CT imaging of the abdomen and pelvis was performed using the standard protocol following bolus administration of intravenous contrast.  CONTRAST:  130mL OMNIPAQUE IOHEXOL 300 MG/ML  SOLN  COMPARISON:  07/14/2009  FINDINGS: BODY WALL: Unremarkable.  LOWER CHEST: Small sliding-type hiatal  hernia. There is nonspecific reticulation in the lower lungs.  ABDOMEN/PELVIS:  Liver: The liver has a lobulated surface and there is mild widening of the hepatic fissures. The surface nodularity is new or increased from 2011. No focal lesion.  Biliary: Gallbladder wall thickening and pericholecystic edema. There are stones layering within the gallbladder. The cystic duct has avid mucosal enhancement and likely contains stones. Best seen on reformatted imaging there is moderate suspicion for a 3 mm stone in the distal common bile duct.  Pancreas: No ductal enlargement or inflammatory change.  Spleen: Unremarkable.  Adrenals: Unremarkable.  Kidneys and ureters: No hydronephrosis or stone.  Bladder: Unremarkable.  Reproductive: Unremarkable.  Bowel: No bowel obstruction. Distal colonic diverticulosis. No pericecal inflammation. Small sliding-type hiatal hernia. Duodenal diverticula are present and uninflamed.  Retroperitoneum: No mass or adenopathy.  Peritoneum: No ascites or pneumoperitoneum.  Vascular: No acute abnormality.  OSSEOUS: Severe and diffuse degenerative disc change Remote appearing T11 compression fracture.  IMPRESSION: 1. Findings consistent with acute cholecystitis. 2. Moderate suspicion for choledocholithiasis. 3. Morphologic changes suggesting cirrhosis. Correlate for risk factors.   Electronically Signed   By: Jorje Guild M.D.   On: 02/21/2014 03:38   Dg Ercp Biliary & Pancreatic Ducts  02/24/2014   CLINICAL DATA:  Choledocholithiasis.  EXAM: ERCP  TECHNIQUE: Multiple spot images obtained with the fluoroscopic device and submitted for interpretation post-procedure.  COMPARISON:  02/21/2014  FINDINGS: Images show passage of a guidewire and balloon catheter within the common bile duct. Final images show placement of a stent within the common bile duct.  IMPRESSION: Balloon extraction of common duct calculi, with subsequent placement of common bile duct stent.  These images were submitted for  radiologic interpretation only. Please see the procedural report for the amount of contrast and the fluoroscopy time utilized.   Electronically Signed   By: Earle Gell M.D.   On: 02/24/2014 12:02    ASSESSMENT AND PLAN 1.atrial fib with an RVR 2. S/p ERCP and prior chole Rec: continue IV cardizem until she is taking solids, then would reevaluate the need for any meds for atrial fib prevention.  Rithika Seel,M.D.  02/24/2014 12:46 PM

## 2014-02-24 NOTE — Interval H&P Note (Signed)
History and Physical Interval Note:  02/24/2014 8:16 AM  Susan Ford  has presented today for surgery, with the diagnosis of cholangitis  The various methods of treatment have been discussed with the patient and family. After consideration of risks, benefits and other options for treatment, the patient has consented to  Procedure(s): ENDOSCOPIC RETROGRADE CHOLANGIOPANCREATOGRAPHY (ERCP) (N/A) as a surgical intervention .  The patient's history has been reviewed, patient examined, no change in status, stable for surgery.  I have reviewed the patient's chart and labs.  Questions were answered to the patient's satisfaction.     Meredeth Furber M  Assessment:  1.  Bile duct stones. 2.  Elevated LFTs, downtrending.  Plan:  1.  ERCP for anticipated bile duct stone removal. 2.  Risks (up to and including bleeding, infection, perforation, pancreatitis that can be complicated by infected necrosis and death), benefits (removal of stones, alleviating blockage, decreasing risk of cholangitis or choledocholithiasis-related pancreatitis), and alternatives (watchful waiting, percutaneous transhepatic cholangiography) of ERCP were explained to patient/family in detail and patient elects to proceed.

## 2014-02-24 NOTE — Transfer of Care (Signed)
Immediate Anesthesia Transfer of Care Note  Patient: Susan Ford  Procedure(s) Performed: Procedure(s): ENDOSCOPIC RETROGRADE CHOLANGIOPANCREATOGRAPHY (ERCP) (N/A)  Patient Location: PACU  Anesthesia Type:General  Level of Consciousness: awake, alert , oriented and patient cooperative  Airway & Oxygen Therapy: Patient Spontanous Breathing and Patient connected to nasal cannula oxygen  Post-op Assessment: Report given to PACU RN, Post -op Vital signs reviewed and stable and Patient moving all extremities  Post vital signs: Reviewed and stable  Complications: No apparent anesthesia complications

## 2014-02-24 NOTE — Anesthesia Preprocedure Evaluation (Addendum)
Anesthesia Evaluation  Patient identified by MRN, date of birth, ID band Patient awake    Reviewed: Allergy & Precautions, H&P , NPO status , Patient's Chart, lab work & pertinent test results  History of Anesthesia Complications Negative for: history of anesthetic complications  Airway Mallampati: II  TM Distance: >3 FB Neck ROM: Full    Dental  (+) Teeth Intact, Dental Advisory Given   Pulmonary former smoker,  breath sounds clear to auscultation        Cardiovascular + dysrhythmias (converted by diltiazem) Atrial Fibrillation Rhythm:Regular Rate:Tachycardia  Cardiology consult yesterday for new onset Afib/flutter: converted with diltiazem, OK for surgery    Neuro/Psych Chronic back pain vertigo    GI/Hepatic Neg liver ROS, GERD-  Medicated and Controlled,  Endo/Other  negative endocrine ROS  Renal/GU negative Renal ROS     Musculoskeletal   Abdominal   Peds  Hematology negative hematology ROS (+)   Anesthesia Other Findings   Reproductive/Obstetrics                            Anesthesia Physical Anesthesia Plan  ASA: III  Anesthesia Plan: General   Post-op Pain Management:    Induction: Intravenous  Airway Management Planned: Oral ETT  Additional Equipment:   Intra-op Plan:   Post-operative Plan: Extubation in OR  Informed Consent: I have reviewed the patients History and Physical, chart, labs and discussed the procedure including the risks, benefits and alternatives for the proposed anesthesia with the patient or authorized representative who has indicated his/her understanding and acceptance.   Dental advisory given  Plan Discussed with: CRNA and Surgeon  Anesthesia Plan Comments: (Plan routine monitors, GETA)        Anesthesia Quick Evaluation

## 2014-02-24 NOTE — Plan of Care (Signed)
Problem: Phase II Progression Outcomes Goal: Discharge plan established Outcome: Completed/Met Date Met:  02/24/14

## 2014-02-24 NOTE — Op Note (Signed)
[facility] [place], [id number]   ERCP PROCEDURE REPORT        EXAM DATE: 02/24/2014  PATIENT NAME:          Susan Ford, Susan Ford          MR #: [id number] BIRTHDATE:       05-25-1929     VISIT #:     [id number] ATTENDING:     Arta Silence, MD     STATUS:     inpatient REFERRING MD:       [facility]  INDICATIONS:  The patient is a 80 yr old female here for an ERCP due to abnormal intraoperative cholangiogram (CBD stones); elevated LFTs. PROCEDURE PERFORMED:     ERCP with sphincterotomy/papillotomy ERCP with stent placement MEDICATIONS:     General endotracheal anesthesia; Cipro 400 mg IV; glucagon IV  CONSENT: The patient understands the risks and benefits of the procedure and understands that these risks include, but are not limited to: sedation, allergic reaction, infection, perforation and/or bleeding. Alternative means of evaluation and treatment include, among others: physical exam, x-rays, and/or surgical intervention. The patient elects to proceed with this endoscopic procedure.  DESCRIPTION OF PROCEDURE and FINDINGS: Informed was verified, confirmed and timeout was successfully executed by the treatment team. With the patient in left semi-prone position, medications were administered intravenously.The Pentax Ercp Scope [id number] was passed from the mouth into the esophagus and further advanced from the esophagus into the stomach. From stomach scope was directed to the second portion of the duodenum.  Major papilla was aligned with the duodenoscope. The scope position was confirmed fluoroscopically.  Ampulla was located along the margin of a large diverticulum.  Wire advancement initially only to pancreatic duct; accordingly a 5Fr, 3cm pigtail pancreatic duct stent was placed.  With time, deep biliary cannulation was achieved over the stent; this was challenging since the wire preferentially went into the  cystic duct remnant and since the patient's distal CBD was very corkscrew-shaped.  Eventually however deep biliary access was achieved.  Bile duct about 15mm.  Distal bile duct had what appeared to be a couple wedged distal stones.  Biliary sphincterotomy was performed.  Using balloon and basket catheters, I was unable to remove these wedged stones.  As a result, a 8.5 Fr, 5-cm long plastic biliary stent was placed to complete the procedure.      ADVERSE EVENT:     None IMPRESSIONS:     As above.  PD stent placed.  Wedged distal bile duct stones, unable to be removed, CBD stent placed.   RECOMMENDATIONS:     1.  Watch for potential complications of procedure. 2.  Avoid NSAID x 3 days post-procedure. 3.  Clear liquids post-procedure, advancing as tolerated. 4.  Repeat ERCP in a couple months, with cholangioscopy available, with hopes of definitive duct clearance. 5.  [facility] will follow. REPEAT EXAM:   ___________________________________ Arta Silence, MD eSigned:  Arta Silence, MD 02/24/2014 10:49 AM   cc:

## 2014-02-24 NOTE — Anesthesia Postprocedure Evaluation (Signed)
  Anesthesia Post-op Note  Patient: Susan Ford  Procedure(s) Performed: Procedure(s): ENDOSCOPIC RETROGRADE CHOLANGIOPANCREATOGRAPHY (ERCP) (N/A)  Patient Location: PACU  Anesthesia Type:General  Level of Consciousness: awake, alert , oriented and patient cooperative  Airway and Oxygen Therapy: Patient Spontanous Breathing  Post-op Pain: none  Post-op Assessment: Post-op Vital signs reviewed, Patient's Cardiovascular Status Stable, Respiratory Function Stable, Patent Airway, No signs of Nausea or vomiting and Pain level controlled  Post-op Vital Signs: Reviewed and stable  Last Vitals:  Filed Vitals:   02/24/14 1200  BP:   Pulse:   Temp: 36.8 C  Resp: 14    Complications: possible eye injury/irritation of Right eye, ketorolac drops prescribed

## 2014-02-25 ENCOUNTER — Encounter (HOSPITAL_COMMUNITY): Payer: Self-pay | Admitting: Gastroenterology

## 2014-02-25 DIAGNOSIS — I483 Typical atrial flutter: Secondary | ICD-10-CM

## 2014-02-25 LAB — COMPREHENSIVE METABOLIC PANEL
ALT: 79 U/L — ABNORMAL HIGH (ref 0–35)
AST: 27 U/L (ref 0–37)
Albumin: 2.4 g/dL — ABNORMAL LOW (ref 3.5–5.2)
Alkaline Phosphatase: 131 U/L — ABNORMAL HIGH (ref 39–117)
Anion gap: 11 (ref 5–15)
BUN: 12 mg/dL (ref 6–23)
CO2: 24 mEq/L (ref 19–32)
Calcium: 7.9 mg/dL — ABNORMAL LOW (ref 8.4–10.5)
Chloride: 99 mEq/L (ref 96–112)
Creatinine, Ser: 0.56 mg/dL (ref 0.50–1.10)
GFR calc Af Amer: >90 mL/min (ref >90)
GFR calc non Af Amer: 85 mL/min — ABNORMAL LOW (ref >90)
Glucose, Bld: 128 mg/dL — ABNORMAL HIGH (ref 70–99)
Potassium: 4.2 mEq/L (ref 3.7–5.3)
Sodium: 134 mEq/L — ABNORMAL LOW (ref 137–147)
Total Bilirubin: 0.4 mg/dL (ref 0.3–1.2)
Total Protein: 5.8 g/dL — ABNORMAL LOW (ref 6.0–8.3)

## 2014-02-25 LAB — LIPASE, BLOOD: Lipase: 24 U/L (ref 11–59)

## 2014-02-25 LAB — CBC
HCT: 32.4 % — ABNORMAL LOW (ref 36.0–46.0)
Hemoglobin: 10.9 g/dL — ABNORMAL LOW (ref 12.0–15.0)
MCH: 29.8 pg (ref 26.0–34.0)
MCHC: 33.6 g/dL (ref 30.0–36.0)
MCV: 88.5 fL (ref 78.0–100.0)
Platelets: 239 10*3/uL (ref 150–400)
RBC: 3.66 MIL/uL — ABNORMAL LOW (ref 3.87–5.11)
RDW: 12.7 % (ref 11.5–15.5)
WBC: 7.2 10*3/uL (ref 4.0–10.5)

## 2014-02-25 MED ORDER — HYDROCODONE-ACETAMINOPHEN 7.5-325 MG PO TABS
1.0000 | ORAL_TABLET | Freq: Four times a day (QID) | ORAL | Status: DC | PRN
  Administered 2014-02-25 – 2014-02-27 (×4): 1 via ORAL
  Filled 2014-02-25 (×4): qty 1

## 2014-02-25 MED ORDER — HYDROMORPHONE HCL 1 MG/ML IJ SOLN: 0.5000 mg | INTRAMUSCULAR | Status: DC | PRN

## 2014-02-25 MED ORDER — ACETAMINOPHEN 325 MG PO TABS: 650.0000 mg | ORAL_TABLET | Freq: Four times a day (QID) | ORAL | Status: DC | PRN

## 2014-02-25 NOTE — Evaluation (Signed)
Physical Therapy Evaluation Patient Details Name: Susan Ford MRN: [id number] DOB: 11/30/1929 Today's Date: 02/25/2014   History of Present Illness  Pt admitted with cholecystitis s/p ERCP  Clinical Impression  Pt with decreased ability with transfers, gait, functional mobility and balance. Pt requires assist for transfer and gait at this time with use of RW. Pt states son can assist at home and if he confirms then home with HHPT is viable but if not then ST-SNF recommended. Pt will benefit from acute therapy to maximize mobility, balance, gait and safety to decrease burden of care.     Follow Up Recommendations Home health PT;Supervision for mobility/OOB    Equipment Recommendations  None recommended by PT    Recommendations for Other Services       Precautions / Restrictions Precautions Precautions: Fall      Mobility  Bed Mobility Overal bed mobility: Needs Assistance Bed Mobility: Sit to Supine       Sit to supine: Min guard   General bed mobility comments: cues for sequence with increased time  Transfers Overall transfer level: Needs assistance   Transfers: Sit to/from Stand Sit to Stand: Min assist         General transfer comment: cues for hand placement with assist for anterior translation and elevation from surface  Ambulation/Gait Ambulation/Gait assistance: Min assist Ambulation Distance (Feet): 200 Feet Assistive device: Rolling walker (2 wheeled) Gait Pattern/deviations: Step-through pattern;Decreased stride length;Trunk flexed   Gait velocity interpretation: Below normal speed for age/gender General Gait Details: mod cues throughout for posture, position in RW and looking up  Stairs            Wheelchair Mobility    Modified Rankin (Stroke Patients Only)       Balance Overall balance assessment: Needs assistance   Sitting balance-Leahy Scale: Good       Standing balance-Leahy Scale: Poor                                Pertinent Vitals/Pain Pain Assessment: No/denies pain    Home Living Family/patient expects to be discharged to:: Private residence Living Arrangements: Alone Available Help at Discharge: Family;Available 24 hours/day Type of Home: House Home Access: Stairs to enter   [employer] of Steps: pt has steps in with stair lift Home Layout: One level Home Equipment: Walker - 2 wheels;Cane - single point;Bedside commode;Shower seat;Wheelchair - manual Additional Comments: walk in shower. Pt spouse passed in February 2015 and she was the caregiver for him prior    Prior Function Level of Independence: Independent               Hand Dominance        Extremity/Trunk Assessment   Upper Extremity Assessment: Generalized weakness           Lower Extremity Assessment: Generalized weakness      Cervical / Trunk Assessment: Kyphotic  Communication   Communication: No difficulties  Cognition Arousal/Alertness: Awake/alert Behavior During Therapy: WFL for tasks assessed/performed Overall Cognitive Status: Within Functional Limits for tasks assessed                      General Comments      Exercises        Assessment/Plan    PT Assessment Patient needs continued PT services  PT Diagnosis Generalized weakness;Difficulty walking   PT Problem List Decreased strength;Decreased activity tolerance;Decreased balance;Decreased mobility;Decreased knowledge of  use of DME  PT Treatment Interventions Gait training;DME instruction;Functional mobility training;Therapeutic activities;Therapeutic exercise;Patient/family education   PT Goals (Current goals can be found in the Care Plan section) Acute Rehab PT Goals Patient Stated Goal: be able to care for myself PT Goal Formulation: With patient Time For Goal Achievement: 03/11/14 Potential to Achieve Goals: Good    Frequency Min 3X/week   Barriers to discharge   pt states son can come and stay  with her 24hrs/day    Co-evaluation               End of Session   Activity Tolerance: Patient tolerated treatment well Patient left: in bed;with call bell/phone within reach;with family/visitor present Nurse Communication: Mobility status         Time: 7588-3254 PT Time Calculation (min): 22 min   Charges:   PT Evaluation $Initial PT Evaluation Tier I: 1 Procedure PT Treatments $Gait Training: 8-22 mins   PT G CodesMelford Aase 02/25/2014, 1:50 PM Elwyn Reach, [place]

## 2014-02-25 NOTE — Progress Notes (Signed)
    Subjective: Some pain  Objective: Vital signs in last 24 hours: Temp:  [97.8 F (36.6 C)-98.6 F (37 C)] 98.6 F (37 C) (11/02 0556) Pulse Rate:  [88-110] 88 (11/02 0556) Resp:  [13-21] 18 (11/02 0556) BP: (123-136)/(56-72) 130/65 mmHg (11/02 0556) SpO2:  [90 %-99 %] 93 % (11/02 0556) Last BM Date: 02/21/14  Intake/Output from previous day: 11/01 0701 - 11/02 0700 In: 1830 [P.O.:480; I.V.:1350] Out: 250 [Urine:250] Intake/Output this shift:    Medications Current Facility-Administered Medications  Medication Dose Route Frequency Provider Last Rate Last Dose  . 0.9 % NaCl with KCl 20 mEq/ L  infusion   Intravenous Continuous Earnstine Regal, PA-C 50 mL/hr at 02/25/14 0600    . acetaminophen (TYLENOL) tablet 650 mg  650 mg Oral Q6H PRN Earnstine Regal, PA-C      . cefTRIAXone (ROCEPHIN) 2 g in dextrose 5 % 50 mL IVPB  2 g Intravenous Q24H Winfield Cunas., MD   2 g at 02/23/14 1235  . diltiazem (CARDIZEM) 100 mg in dextrose 5 % 100 mL (1 mg/mL) infusion  5-15 mg/hr Intravenous Continuous Armandina Gemma, MD 5 mL/hr at 02/25/14 0600 5 mg/hr at 02/25/14 0600  . gabapentin (NEURONTIN) tablet 600 mg  600 mg Oral QID Doreen Salvage, MD   600 mg at 02/24/14 2008  . HYDROcodone-acetaminophen (NORCO) 7.5-325 MG per tablet 1 tablet  1 tablet Oral Q6H PRN Earnstine Regal, PA-C      . HYDROmorphone (DILAUDID) injection 0.5-1 mg  0.5-1 mg Intravenous Q3H PRN Earnstine Regal, PA-C      . ondansetron [facility]) injection 4 mg  4 mg Intravenous Q6H PRN Erroll Luna, MD   4 mg at 02/24/14 2008    PE: General appearance: alert, cooperative and no distress Lungs: clear to auscultation bilaterally Heart: regular rate and rhythm, S1, S2 normal, no murmur, click, rub or gallop Abdomen: +BS Extremities: No LEE Pulses: 2+ and symmetric Skin: Warm and dry Neurologic: Grossly normal  Lab Results:   Recent Labs  02/23/14 1313 02/25/14 0358  WBC 12.4* 7.2  HGB 12.1 10.9*  HCT 35.9* 32.4*    PLT 198 239   BMET  Recent Labs  02/23/14 1313 02/25/14 0358  NA 133* 134*  K 3.8 4.2  CL 95* 99  CO2 26 24  GLUCOSE 124* 128*  BUN 11 12  CREATININE 0.57 0.56  CALCIUM 8.4 7.9*   PT/INR  Recent Labs  02/22/14 1140  LABPROT 15.4*  INR 1.21    Assessment/Plan  Active Problems:   Acute calculous cholecystitis  POD #1 ERCP with sphincterotomy/papillotomy. ERCP with stent placement   Atrial fib Maintaining NSR.  On IV cardizem 5mg /hr.  Change to PO when ok with surgery.  Per Dr. Mare Ferrari, no anticoagulation.  Chadsvac 3.  30 day event monitor after DC.  EF 55-60% with normal wall motion.  Moderate TR.  PA pressure 64mmHg. Small pericardial effusion.  May need repeat echo in a month.    Anemia  Hgb dropped from 12.1 to 10.9.     LOS: 4 days    HAGER, BRYAN PA-C 02/25/2014 8:47 AM  Patient examined chart reviewed Agree with above  Oral cardizem when taking PO Follow HB  Jenkins Rouge

## 2014-02-25 NOTE — Progress Notes (Signed)
Pt was educated regarding benefits of ambulation, however continues to refuse to ambulate in hall.  Will continue to monitor.

## 2014-02-25 NOTE — Progress Notes (Signed)
[facility] Progress Note  Subjective: Patient having mild abdominal pain, tolerating soft diet  Objective: Vital signs in last 24 hours: Temp:  [97.8 F (36.6 C)-98.6 F (37 C)] 98.6 F (37 C) (11/02 0556) Pulse Rate:  [88-110] 88 (11/02 0556) Resp:  [13-21] 18 (11/02 0556) BP: (123-136)/(56-72) 130/65 mmHg (11/02 0556) SpO2:  [90 %-99 %] 93 % (11/02 0556) Weight change:    PF:XTKWIOX soft, no icterus  Lab Results: Results for orders placed or performed during the hospital encounter of 02/21/14 (from the past 24 hour(s))  CBC     Status: Abnormal   Collection Time: 02/25/14  3:58 AM  Result Value Ref Range   WBC 7.2 4.0 - 10.5 K/uL   RBC 3.66 (L) 3.87 - 5.11 MIL/uL   Hemoglobin 10.9 (L) 12.0 - 15.0 g/dL   HCT 32.4 (L) 36.0 - 46.0 %   MCV 88.5 78.0 - 100.0 fL   MCH 29.8 26.0 - 34.0 pg   MCHC 33.6 30.0 - 36.0 g/dL   RDW 12.7 11.5 - 15.5 %   Platelets 239 150 - 400 K/uL  Comprehensive metabolic panel     Status: Abnormal   Collection Time: 02/25/14  3:58 AM  Result Value Ref Range   Sodium 134 (L) 137 - 147 mEq/L   Potassium 4.2 3.7 - 5.3 mEq/L   Chloride 99 96 - 112 mEq/L   CO2 24 19 - 32 mEq/L   Glucose, Bld 128 (H) 70 - 99 mg/dL   BUN 12 6 - 23 mg/dL   Creatinine, Ser 0.56 0.50 - 1.10 mg/dL   Calcium 7.9 (L) 8.4 - 10.5 mg/dL   Total Protein 5.8 (L) 6.0 - 8.3 g/dL   Albumin 2.4 (L) 3.5 - 5.2 g/dL   AST 27 0 - 37 U/L   ALT 79 (H) 0 - 35 U/L   Alkaline Phosphatase 131 (H) 39 - 117 U/L   Total Bilirubin 0.4 0.3 - 1.2 mg/dL   GFR calc non Af Amer 85 (L) >90 mL/min   GFR calc Af Amer >90 >90 mL/min   Anion gap 11 5 - 15  Lipase, blood     Status: None   Collection Time: 02/25/14  3:58 AM  Result Value Ref Range   Lipase 24 11 - 59 U/L    Studies/Results: Dg Ercp Biliary & Pancreatic Ducts  02/24/2014   CLINICAL DATA:  Choledocholithiasis.  EXAM: ERCP  TECHNIQUE: Multiple spot images obtained with the fluoroscopic device and submitted for  interpretation post-procedure.  COMPARISON:  02/21/2014  FINDINGS: Images show passage of a guidewire and balloon catheter within the common bile duct. Final images show placement of a stent within the common bile duct.  IMPRESSION: Balloon extraction of common duct calculi, with subsequent placement of common bile duct stent.  These images were submitted for radiologic interpretation only. Please see the procedural report for the amount of contrast and the fluoroscopy time utilized.   Electronically Signed   By: Earle Gell M.D.   On: 02/24/2014 12:02      Assessment: 1. Common bile duct stone status post ERCP with stent placement with retained stones.LFTs nearly normal 2. New-onset atrial fibrillation  Plan: 1. Will leave stent in 2 or 3 months, allow sphincterotomy to heal, and then repeat ERCP attempt possibly using a spyglass technology to fragment stones as needed. This will need to be arranged with Dr. Paulita Fujita at the time of discharge. We'll continue to follow with you.    BDZHG,DJME  C 02/25/2014, 8:55 AM

## 2014-02-25 NOTE — Progress Notes (Signed)
1 Day Post-Op  Subjective: Telem looks like sinus.  She is still using dilaudid for pain relief and feel pretty weak. Has not been up and around except to go to [place].  She lives alone, but has some children who are going to help with her care after she goes home.  Objective: Vital signs in last 24 hours: Temp:  [97.8 F (36.6 C)-98.6 F (37 C)] 98.6 F (37 C) (11/02 0556) Pulse Rate:  [88-110] 88 (11/02 0556) Resp:  [13-21] 18 (11/02 0556) BP: (123-144)/(56-89) 130/65 mmHg (11/02 0556) SpO2:  [90 %-99 %] 93 % (11/02 0556) Last BM Date: 02/21/14 480 PO Diet: soft Afebrile, VSS LFT's improving WBC is normal Intake/Output from previous day: 11/01 0701 - 11/02 0700 In: 1830 [P.O.:480; I.V.:1350] Out: 250 [Urine:250] Intake/Output this shift:    General appearance: alert, cooperative and no distress Resp: clear to auscultation bilaterally GI: soft,  sore, brusing on abdomen.  Tolerating diet.  Lab Results:   Recent Labs  02/23/14 1313 02/25/14 0358  WBC 12.4* 7.2  HGB 12.1 10.9*  HCT 35.9* 32.4*  PLT 198 239    BMET  Recent Labs  02/23/14 1313 02/25/14 0358  NA 133* 134*  K 3.8 4.2  CL 95* 99  CO2 26 24  GLUCOSE 124* 128*  BUN 11 12  CREATININE 0.57 0.56  CALCIUM 8.4 7.9*   PT/INR  Recent Labs  02/22/14 1140  LABPROT 15.4*  INR 1.21     Recent Labs Lab 02/20/14 2253 02/22/14 0428 02/23/14 1313 02/25/14 0358  AST 20 257* 55* 27  ALT 15 302* 147* 79*  ALKPHOS 81 83 115 131*  BILITOT 0.4 0.7 0.4 0.4  PROT 7.5 5.9* 5.9* 5.8*  ALBUMIN 4.3 2.9* 2.5* 2.4*     Lipase     Component Value Date/Time   LIPASE 24 02/25/2014 0358     Studies/Results: Dg Ercp Biliary & Pancreatic Ducts  02/24/2014   CLINICAL DATA:  Choledocholithiasis.  EXAM: ERCP  TECHNIQUE: Multiple spot images obtained with the fluoroscopic device and submitted for interpretation post-procedure.  COMPARISON:  02/21/2014  FINDINGS: Images show passage of a guidewire and balloon  catheter within the common bile duct. Final images show placement of a stent within the common bile duct.  IMPRESSION: Balloon extraction of common duct calculi, with subsequent placement of common bile duct stent.  These images were submitted for radiologic interpretation only. Please see the procedural report for the amount of contrast and the fluoroscopy time utilized.   Electronically Signed   By: Earle Gell M.D.   On: 02/24/2014 12:02    Medications: . cefTRIAXone (ROCEPHIN)  IV  2 g Intravenous Q24H  . gabapentin  600 mg Oral QID   . 0.9 % NaCl with KCl 20 mEq / L 50 mL/hr at 02/25/14 0600  . diltiazem (CARDIZEM) infusion 5 mg/hr (02/25/14 0600)   Prior to Admission medications   Medication Sig Start Date End Date Taking? Authorizing Provider  ascorbic acid (VITAMIN C) 1000 MG tablet Take 1,000 mg by mouth daily.   Yes Historical Provider, MD  aspirin 81 MG tablet Take 81 mg by mouth daily.   Yes Historical Provider, MD  calcium citrate-vitamin D (CITRACAL+D) 315-200 MG-UNIT per tablet Take 1 tablet by mouth daily.     Yes Historical Provider, MD  celecoxib (CELEBREX) 200 MG capsule Take 1 capsule (200 mg total) by mouth daily as needed (arthritis pain). 10/23/13  Yes Burnis Medin, MD  estradiol (VIVELLE-DOT) 0.025  MG/24HR Place 1 patch onto the skin once a week. On Saturday   Yes Historical Provider, MD  famotidine (PEPCID) 40 MG tablet Take 40 mg by mouth every morning.    Yes Historical Provider, MD  gabapentin (NEURONTIN) 600 MG tablet Take 1 tablet (600 mg total) by mouth 4 (four) times daily. 01/04/14  Yes Marcial Pacas, MD  HYDROcodone-ibuprofen (VICOPROFEN) 7.5-200 MG per tablet Take 1 tablet by mouth every 8 (eight) hours as needed. For pain 02/12/14  Yes Marcial Pacas, MD  Multiple Vitamin (MULTIVITAMIN) tablet Take 1 tablet by mouth daily.    Yes Historical Provider, MD  simvastatin (ZOCOR) 20 MG tablet Take 20 mg by mouth at bedtime.   Yes Historical Provider, MD  pneumococcal  13-valent conjugate vaccine (PREVNAR 13) SUSP injection Inject 0.5 mLs into the muscle once. 02/20/14   Burnis Medin, MD    Assessment/Plan 1. Cholecystitis with CBD stones  A)  LAPAROSCOPIC CHOLECYSTECTOMY WITH INTRAOPERATIVE  CHOLANGIOGRAM, 02/21/2014, Doreen Salvage, MD.   [facility] showing CBD stones  B)  S/p  ERCP with sphincterotomy/papillotomy, ERCP with stent placement, Arta Silence, MD, 02/24/2014 . 2. Aflutter with RVR prior to ERCP 3. GERD 4. Unspecified hereditary and idiopathic peripheral neuropathy 5.  Chronic back and lower extremity neuropathy.   Plan:  Decrease fluids, work on mobility and pain control with PO meds.  I don't think the I/O is right. She is still on Cardizem drip, and will defer to them to adjust her medicines to PO's. Dr. Paulita Fujita does not want her on NSAIDS for 3 day.  She has chronic pain and as the list above shows she takes a fair amount of this at home before surgery. I have increased her Norco to the 7.5/325 tablets, and ask PT to help and be sure she is safe to go home. Check weight.    LOS: 4 days    Angy Swearengin 02/25/2014

## 2014-02-26 LAB — COMPREHENSIVE METABOLIC PANEL
ALT: 59 U/L — ABNORMAL HIGH (ref 0–35)
AST: 20 U/L (ref 0–37)
Albumin: 2.5 g/dL — ABNORMAL LOW (ref 3.5–5.2)
Alkaline Phosphatase: 110 U/L (ref 39–117)
Anion gap: 11 (ref 5–15)
BUN: 11 mg/dL (ref 6–23)
CO2: 25 mEq/L (ref 19–32)
Calcium: 8.3 mg/dL — ABNORMAL LOW (ref 8.4–10.5)
Chloride: 101 mEq/L (ref 96–112)
Creatinine, Ser: 0.54 mg/dL (ref 0.50–1.10)
GFR calc Af Amer: >90 mL/min (ref >90)
GFR calc non Af Amer: 86 mL/min — ABNORMAL LOW (ref >90)
Glucose, Bld: 103 mg/dL — ABNORMAL HIGH (ref 70–99)
Potassium: 3.6 mEq/L — ABNORMAL LOW (ref 3.7–5.3)
Sodium: 137 mEq/L (ref 137–147)
Total Bilirubin: 0.4 mg/dL (ref 0.3–1.2)
Total Protein: 5.8 g/dL — ABNORMAL LOW (ref 6.0–8.3)

## 2014-02-26 LAB — CBC
HCT: 34.2 % — ABNORMAL LOW (ref 36.0–46.0)
Hemoglobin: 11.7 g/dL — ABNORMAL LOW (ref 12.0–15.0)
MCH: 30.2 pg (ref 26.0–34.0)
MCHC: 34.2 g/dL (ref 30.0–36.0)
MCV: 88.4 fL (ref 78.0–100.0)
Platelets: 286 10*3/uL (ref 150–400)
RBC: 3.87 MIL/uL (ref 3.87–5.11)
RDW: 13 % (ref 11.5–15.5)
WBC: 8.3 10*3/uL (ref 4.0–10.5)

## 2014-02-26 MED ORDER — BISACODYL 10 MG RE SUPP
10.0000 mg | Freq: Once | RECTAL | Status: AC
Start: 2014-02-26 — End: 2014-02-26
  Administered 2014-02-26: 10 mg via RECTAL
  Filled 2014-02-26: qty 1

## 2014-02-26 MED ORDER — POLYETHYLENE GLYCOL 3350 17 G PO PACK
17.0000 g | PACK | Freq: Every day | ORAL | Status: DC
  Administered 2014-02-26: 17 g via ORAL
  Filled 2014-02-26 (×2): qty 1

## 2014-02-26 MED ORDER — KETOROLAC TROMETHAMINE 0.5 % OP SOLN
1.0000 [drp] | Freq: Four times a day (QID) | OPHTHALMIC | Status: DC | PRN
  Filled 2014-02-26: qty 3

## 2014-02-26 MED ORDER — DILTIAZEM HCL ER COATED BEADS 120 MG PO CP24
120.0000 mg | ORAL_CAPSULE | Freq: Every day | ORAL | Status: DC
  Administered 2014-02-26 – 2014-02-27 (×2): 120 mg via ORAL
  Filled 2014-02-26 (×2): qty 1

## 2014-02-26 NOTE — Progress Notes (Signed)
Patient ID: Susan Ford, female   DOB: 14-Sep-1929, 80 y.o.   MRN: [id number] 2 Days Post-Op  Subjective: Pt states she is passing flatus, but no BM since last Wednesday.  Tolerating solid diet with no nausea.  Pain controlled.    Objective: Vital signs in last 24 hours: Temp:  [98.1 F (36.7 C)-98.3 F (36.8 C)] 98.3 F (36.8 C) (11/03 0352) Pulse Rate:  [85-94] 94 (11/03 0352) Resp:  [18] 18 (11/03 0352) BP: (123-149)/(65-80) 149/80 mmHg (11/03 0352) SpO2:  [95 %] 95 % (11/03 0352) Weight:  [143 lb 9.6 oz (65.137 kg)] 143 lb 9.6 oz (65.137 kg) (11/03 0500) Last BM Date: 02/21/14  Intake/Output from previous day: 11/02 0701 - 11/03 0700 In: 240 [P.O.:240] Out: -  Intake/Output this shift:    PE: Abd: soft, minimal distention, +BS, appropriately tender, incisions c/d/i   Lab Results:   Recent Labs  02/25/14 0358 02/26/14 0508  WBC 7.2 8.3  HGB 10.9* 11.7*  HCT 32.4* 34.2*  PLT 239 286   BMET  Recent Labs  02/25/14 0358 02/26/14 0508  NA 134* 137  K 4.2 3.6*  CL 99 101  CO2 24 25  GLUCOSE 128* 103*  BUN 12 11  CREATININE 0.56 0.54  CALCIUM 7.9* 8.3*   PT/INR No results for input(s): LABPROT, INR in the last 72 hours. CMP     Component Value Date/Time   NA 137 02/26/2014 0508   K 3.6* 02/26/2014 0508   CL 101 02/26/2014 0508   CO2 25 02/26/2014 0508   GLUCOSE 103* 02/26/2014 0508   BUN 11 02/26/2014 0508   CREATININE 0.54 02/26/2014 0508   CALCIUM 8.3* 02/26/2014 0508   PROT 5.8* 02/26/2014 0508   ALBUMIN 2.5* 02/26/2014 0508   AST 20 02/26/2014 0508   ALT 59* 02/26/2014 0508   ALKPHOS 110 02/26/2014 0508   BILITOT 0.4 02/26/2014 0508   GFRNONAA 86* 02/26/2014 0508   GFRAA >90 02/26/2014 0508   Lipase     Component Value Date/Time   LIPASE 24 02/25/2014 0358       Studies/Results: Dg Ercp Biliary & Pancreatic Ducts  02/24/2014   CLINICAL DATA:  Choledocholithiasis.  EXAM: ERCP  TECHNIQUE: Multiple spot images obtained with the  fluoroscopic device and submitted for interpretation post-procedure.  COMPARISON:  02/21/2014  FINDINGS: Images show passage of a guidewire and balloon catheter within the common bile duct. Final images show placement of a stent within the common bile duct.  IMPRESSION: Balloon extraction of common duct calculi, with subsequent placement of common bile duct stent.  These images were submitted for radiologic interpretation only. Please see the procedural report for the amount of contrast and the fluoroscopy time utilized.   Electronically Signed   By: Earle Gell M.D.   On: 02/24/2014 12:02    Anti-infectives: Anti-infectives    Start     Dose/Rate Route Frequency Ordered Stop   02/22/14 1200  cefTRIAXone (ROCEPHIN) 2 g in dextrose 5 % 50 mL IVPB     2 g100 mL/hr over 30 Minutes Intravenous Every 24 hours 02/22/14 1133     02/21/14 0800  [MAR Hold]  cefOXitin (MEFOXIN) 1 g in dextrose 5 % 50 mL IVPB     (MAR Hold since 02/21/14 0945)   1 g100 mL/hr over 30 Minutes Intravenous On call to O.R. 02/21/14 0748 02/21/14 1034   02/21/14 0600  ciprofloxacin (CIPRO) IVPB 400 mg  Status:  Discontinued     400 mg200 mL/hr over  60 Minutes Intravenous Every 12 hours 02/21/14 0513 02/22/14 1637   02/21/14 0345  cefTRIAXone (ROCEPHIN) 1 g in dextrose 5 % 50 mL IVPB     1 g100 mL/hr over 30 Minutes Intravenous  Once 02/21/14 0337 02/21/14 0558   02/21/14 0115  cefTRIAXone (ROCEPHIN) 1 g in dextrose 5 % 50 mL IVPB     1 g100 mL/hr over 30 Minutes Intravenous  Once 02/21/14 0111 02/21/14 0315       Assessment/Plan  1. POD 5, s/p lap chole 2. S/p ERCP with stent placement for choledocholithiasis 3. New onset a flutter 4. Deconditioning  Plan: 1. HH PT arranged for patient 2. cardizem drip to stop today and convert to oral  3. Patient wants to have a BM prior to going home.  Will give miralax today as as well as a dulcolax suppository.  Plan for dc home tomorrow.  LOS: 5 days    Bartt Gonzaga  E 02/26/2014, 9:27 AM Pager: [phone]

## 2014-02-26 NOTE — Progress Notes (Signed)
Patient ID: Susan Ford, female   DOB: 25-Apr-1930, 80 y.o.   MRN: [id number]    Subjective: Some pain but better taking PO well   Objective: Vital signs in last 24 hours: Temp:  [98.1 F (36.7 C)-98.3 F (36.8 C)] 98.3 F (36.8 C) (11/03 0352) Pulse Rate:  [85-94] 94 (11/03 0352) Resp:  [18] 18 (11/03 0352) BP: (123-149)/(65-80) 149/80 mmHg (11/03 0352) SpO2:  [95 %] 95 % (11/03 0352) Weight:  [65.137 kg (143 lb 9.6 oz)] 65.137 kg (143 lb 9.6 oz) (11/03 0500) Last BM Date: 02/21/14  Intake/Output from previous day: 11/02 0701 - 11/03 0700 In: 240 [P.O.:240] Out: -  Intake/Output this shift:    Medications Current Facility-Administered Medications  Medication Dose Route Frequency Provider Last Rate Last Dose  . 0.9 % NaCl with KCl 20 mEq/ L  infusion   Intravenous Continuous Earnstine Regal, PA-C 10 mL/hr at 02/25/14 1616    . acetaminophen (TYLENOL) tablet 650 mg  650 mg Oral Q6H PRN Earnstine Regal, PA-C      . cefTRIAXone (ROCEPHIN) 2 g in dextrose 5 % 50 mL IVPB  2 g Intravenous Q24H Winfield Cunas., MD   2 g at 02/25/14 1156  . diltiazem (CARDIZEM) 100 mg in dextrose 5 % 100 mL (1 mg/mL) infusion  5-15 mg/hr Intravenous Continuous Armandina Gemma, MD 5 mL/hr at 02/25/14 1647 5 mg/hr at 02/25/14 1647  . gabapentin (NEURONTIN) tablet 600 mg  600 mg Oral QID Doreen Salvage, MD   600 mg at 02/25/14 2014  . HYDROcodone-acetaminophen (NORCO) 7.5-325 MG per tablet 1 tablet  1 tablet Oral Q6H PRN Earnstine Regal, PA-C   1 tablet at 02/25/14 2014  . HYDROmorphone (DILAUDID) injection 0.5-1 mg  0.5-1 mg Intravenous Q3H PRN Earnstine Regal, PA-C      . ketorolac (ACULAR) 0.5 % ophthalmic solution 1 drop  1 drop Both Eyes QID PRN E. Annye Asa, MD      . ondansetron [facility]) injection 4 mg  4 mg Intravenous Q6H PRN Erroll Luna, MD   4 mg at 02/24/14 2008    PE: General appearance: alert, cooperative and no distress Lungs: clear to auscultation bilaterally Heart:  regular rate and rhythm, S1, S2 normal, no murmur, click, rub or gallop Abdomen: +BS Extremities: No LEE Pulses: 2+ and symmetric Skin: Warm and dry Neurologic: Grossly normal  Lab Results:   Recent Labs  02/23/14 1313 02/25/14 0358 02/26/14 0508  WBC 12.4* 7.2 8.3  HGB 12.1 10.9* 11.7*  HCT 35.9* 32.4* 34.2*  PLT 198 239 286   BMET  Recent Labs  02/23/14 1313 02/25/14 0358 02/26/14 0508  NA 133* 134* 137  K 3.8 4.2 3.6*  CL 95* 99 101  CO2 26 24 25   GLUCOSE 124* 128* 103*  BUN 11 12 11   CREATININE 0.57 0.56 0.54  CALCIUM 8.4 7.9* 8.3*   PT/INR No results for input(s): LABPROT, INR in the last 72 hours.  Assessment/Plan  Active Problems:   Acute calculous cholecystitis   ERCP with sphincterotomy/papillotomy. ERCP with stent placement   Atrial fib Maintaining NSR.  Change to PO cardizem   Per Dr. Mare Ferrari, no anticoagulation.  Chadsvac 3.  30 day event monitor after DC.  EF 55-60% with normal wall motion.  Moderate TR.  PA pressure 81mmHg. Small pericardial effusion.  May need repeat echo in a month.    Anemia  Hgb dropped from 12.1 to 10.9.     LOS: 5 days  Jenkins Rouge

## 2014-02-26 NOTE — Care Management Note (Signed)
    Page 1 of 2   02/27/2014     2:13:41 PM CARE MANAGEMENT NOTE 02/27/2014  Patient:  Susan Ford, Susan Ford   Account Number:  [id number]  Date Initiated:  02/26/2014  Documentation initiated by:  Donna Snooks  Subjective/Objective Assessment:   Pt adm on 02/20/14 with Afib, cholecystitis.  PTA, pt resided at home alone and was independent.     Action/Plan:   Pt plans to dc home with son and dtr in law in [place] until Monday of next week.   Anticipated DC Date:  02/27/2014   Anticipated DC Plan:  [place]  CM consult      [facility] Choice  HOME HEALTH   Choice offered to / List presented to:  C-1 Patient        [place] arranged  HH-2 PT      [place]   Status of service:  Completed, signed off Medicare Important Message given?  YES (If response is "NO", the following Medicare IM given date fields will be blank) Date Medicare IM given:  02/25/2014 Medicare IM given by:  Sada Mazzoni Date Additional Medicare IM given:   Additional Medicare IM given by:    Discharge Disposition:  [place]  Per UR Regulation:  Reviewed for med. necessity/level of care/duration of stay  If discussed at [place] of Stay Meetings, dates discussed:   02/26/2014    Comments:  02/27/14 Ellan Lambert, RN,BSN [phone] Pt for dc home today.  She chooses [place] for [facility] needs; referral to [place], per pt choice.  Start of care 11/9 or 11/10, as pt will be out of town until then.  02/26/14 Ellan Lambert, RN, BSN [phone] PT recommending [place] follow up at dc, and order written by MD.  Pt agreeable to this, but states she does not want services to start until she returns to [facility] on Monday, 11/9. She states she has a RW, and will be walking with family while in [place].  Pt given list of [place] providers for [place] for review.  Will follow up with pt later today or in AM with her [facility] choice.

## 2014-02-26 NOTE — Plan of Care (Signed)
Problem: Phase II Progression Outcomes Goal: Tolerating diet Outcome: Progressing     

## 2014-02-27 ENCOUNTER — Ambulatory Visit: Payer: Medicare Other

## 2014-02-27 MED ORDER — HYDROCODONE-ACETAMINOPHEN 7.5-325 MG PO TABS: 1.0000 | ORAL_TABLET | Freq: Four times a day (QID) | ORAL | Status: DC | PRN

## 2014-02-27 MED ORDER — FLEET ENEMA 7-19 GM/118ML RE ENEM
1.0000 | ENEMA | Freq: Once | RECTAL | Status: DC
  Filled 2014-02-27: qty 1

## 2014-02-27 MED ORDER — DILTIAZEM HCL ER COATED BEADS 120 MG PO CP24: 120.0000 mg | ORAL_CAPSULE | Freq: Every day | ORAL | Status: DC

## 2014-02-27 NOTE — Discharge Summary (Signed)
[facility] Discharge Summary   Patient ID: MACAYLA EKDAHL MRN: [id number] DOB/AGE: August 01, 1929 80 y.o.  Admit date: 02/21/2014 Discharge date: 02/27/2014   Admitting Diagnosis: Cholelithiasis Choledocholithiasis Cholecystitis New onset A.Flutter   Discharge Diagnosis Patient Active Problem List   Diagnosis Date Noted  . Atrial flutter 02/23/2014  . Acute calculous cholecystitis 02/21/2014  . Spinal stenosis of lumbar region 01/04/2014  . Recent bereavement 10/25/2013  . Arthritis 10/25/2013  . Hyperlipidemia 10/23/2013  . Unspecified hereditary and idiopathic peripheral neuropathy   . Nausea alone 07/14/2012  . Neuropathy, peripheral 05/05/2011  . High risk medication use 05/05/2011  . Subcutaneous emphysema 05/05/2011  . Acute respiratory infection 04/03/2011  . Cough 04/03/2011  . Neuropathy 04/03/2011  . DEGENERATIVE JOINT DISEASE 05/15/2010  . STIFFNESS OF JOINT NEC ANKLE AND FOOT 05/15/2010  . GANGLION CYST, WRIST, RIGHT 05/15/2010  . VERTIGO, BENIGN PAROXYSMAL POSITION 01/31/2007    Consultants Dr. Mare Ferrari  Imaging: No results found.  Procedures Dr. Hulen Skains (02/21/14) - Laparoscopic Cholecystectomy with [facility]   Hospital Course:  80 y/o white female presented to [facility] with abdominal pain. Location epigastrium Severe started 24 hours ago constant ache. Nothing brought it on. No radiation Nothing made it better. CT shows thickened GB with stones and possible CBD stone.   Patient was admitted and underwent procedure listed above.  Tolerated procedure well and was transferred to the floor.  During her surgery she was found to have choledocholithiasis thus GI was consulted and they completed an ERCP on 02/24/14.  No stones were removed thus a sphincterotomy/papillotomy and stent placement was done.  Her LFT's have trended down to normal or near normal.  Diet was advanced as tolerated.  She had a mild post-operative ileus, which soon resolved.  On  POD #5, the patient was voiding well, tolerating diet, ambulating well, pain well controlled, vital signs stable, incisions c/d/i and felt stable for discharge home.  Patient will follow up in our office in 2-3 weeks and knows to call with questions or concerns.  She will need follow up with Dr. Paulita Fujita since she currently has a biliary stent placed.  Also she will need close follow up with Dr. Mare Ferrari her cardiologist.  They have recommended that she not be sent home on any anticoagulation.  She will have a 30 day event monitor, and will likely need repeat echo in 1 month.  She will discharge home on cardizem PO for her AFIB.      Physical Exam: General:  Alert, NAD, pleasant, comfortable Abd:  Soft, ND, mild tenderness, incisions C/D/I     Medication List    STOP taking these medications        HYDROcodone-ibuprofen 7.5-200 MG per tablet  Commonly known as:  VICOPROFEN     pneumococcal 13-valent conjugate vaccine Susp injection  Commonly known as:  PREVNAR 13      TAKE these medications        ascorbic acid 1000 MG tablet  Commonly known as:  VITAMIN C  Take 1,000 mg by mouth daily.     aspirin 81 MG tablet  Take 81 mg by mouth daily.     calcium citrate-vitamin D 315-200 MG-UNIT per tablet  Commonly known as:  CITRACAL+D  Take 1 tablet by mouth daily.     celecoxib 200 MG capsule  Commonly known as:  CELEBREX  Take 1 capsule (200 mg total) by mouth daily as needed (arthritis pain).     diltiazem 120 MG 24 hr capsule  Commonly known as:  CARDIZEM CD  Take 1 capsule (120 mg total) by mouth daily.     estradiol 0.025 MG/24HR  Commonly known as:  VIVELLE-DOT  Place 1 patch onto the skin once a week. On Saturday     famotidine 40 MG tablet  Commonly known as:  PEPCID  Take 40 mg by mouth every morning.     gabapentin 600 MG tablet  Commonly known as:  NEURONTIN  Take 1 tablet (600 mg total) by mouth 4 (four) times daily.     HYDROcodone-acetaminophen 7.5-325 MG per  tablet  Commonly known as:  NORCO  Take 1 tablet by mouth every 6 (six) hours as needed for moderate pain.     multivitamin tablet  Take 1 tablet by mouth daily.     simvastatin 20 MG tablet  Commonly known as:  ZOCOR  Take 20 mg by mouth at bedtime.         Follow-up Information    Follow up with WYATT, JAY, MD. Schedule an appointment as soon as possible for a visit in 3 weeks.   Specialty:  General Surgery   Why:  For post-operation check, the office should call to set up an appointment time/date.   Contact information:   [address] [place] [address] [phone]       Follow up with Lottie Dawson, MD.   Specialties:  Internal Medicine, Pediatrics   Why:  For post-hospital follow up   Contact information:   [place] [address] [phone]       Follow up with Darlin Coco, MD In 1 week.   Specialty:  Cardiology   Why:  For post-hospital follow up   Contact information:   [address] [place] [address] [phone]       Follow up with Landry Dyke, MD. Schedule an appointment as soon as possible for a visit in 2 weeks.   Specialty:  Gastroenterology   Why:  For post-procedure check   Contact information:   [address] [place] [address]       Signed: Excell Seltzer [facility] [phone]  02/27/2014, 11:18 AM

## 2014-02-27 NOTE — Discharge Instructions (Signed)

## 2014-02-27 NOTE — Progress Notes (Signed)
IV and tele monitor d/c at this time; pt given d/c instructions and prescriptions; pt to d/c home with son; will cont. To monitor.

## 2014-02-27 NOTE — Progress Notes (Signed)
[facility] Progress Note  Subjective: Patient without abdominal pain LFTs normal  Objective: Vital signs in last 24 hours: Temp:  [97.7 F (36.5 C)-99 F (37.2 C)] 97.7 F (36.5 C) (11/04 0939) Pulse Rate:  [67-90] 76 (11/04 0939) Resp:  [18] 18 (11/04 0939) BP: (135-152)/(66-74) 149/74 mmHg (11/04 0939) SpO2:  [95 %-97 %] 97 % (11/04 0939) Weight:  [64.501 kg (142 lb 3.2 oz)] 64.501 kg (142 lb 3.2 oz) (11/04 0540) Weight change: -0.635 kg (-1 lb 6.4 oz)   MM:[id number]  Lab Results: No results found for this or any previous visit (from the past 24 hour(s)).  Studies/Results: No results found.    Assessment: Common bile duct stones, that is post sphincterotomy and stent placement Plan: Patient states she is to be discharged today. Follow-up arranged with Dr. Paulita Fujita for eventual repeat ERCP and attempt at removing remaining stones.Marland Kitchen    Susan Ford C 02/27/2014, 11:34 AM

## 2014-03-05 DIAGNOSIS — M4806 Spinal stenosis, lumbar region: Secondary | ICD-10-CM | POA: Diagnosis not present

## 2014-03-05 DIAGNOSIS — M1990 Unspecified osteoarthritis, unspecified site: Secondary | ICD-10-CM | POA: Diagnosis not present

## 2014-03-05 DIAGNOSIS — G609 Hereditary and idiopathic neuropathy, unspecified: Secondary | ICD-10-CM | POA: Diagnosis not present

## 2014-03-05 DIAGNOSIS — I4892 Unspecified atrial flutter: Secondary | ICD-10-CM | POA: Diagnosis not present

## 2014-03-05 DIAGNOSIS — K8080 Other cholelithiasis without obstruction: Secondary | ICD-10-CM | POA: Diagnosis not present

## 2014-03-05 DIAGNOSIS — Z48815 Encounter for surgical aftercare following surgery on the digestive system: Secondary | ICD-10-CM | POA: Diagnosis not present

## 2014-03-07 ENCOUNTER — Telehealth: Payer: Self-pay | Admitting: Internal Medicine

## 2014-03-07 NOTE — Telephone Encounter (Signed)
Pharm needs a diagnostic code for pt's prevnar 13 in order to administer. [facility]/ [address]

## 2014-03-08 DIAGNOSIS — I4892 Unspecified atrial flutter: Secondary | ICD-10-CM | POA: Diagnosis not present

## 2014-03-08 DIAGNOSIS — M1990 Unspecified osteoarthritis, unspecified site: Secondary | ICD-10-CM | POA: Diagnosis not present

## 2014-03-08 DIAGNOSIS — G609 Hereditary and idiopathic neuropathy, unspecified: Secondary | ICD-10-CM | POA: Diagnosis not present

## 2014-03-08 DIAGNOSIS — M4806 Spinal stenosis, lumbar region: Secondary | ICD-10-CM | POA: Diagnosis not present

## 2014-03-08 DIAGNOSIS — Z48815 Encounter for surgical aftercare following surgery on the digestive system: Secondary | ICD-10-CM | POA: Diagnosis not present

## 2014-03-08 DIAGNOSIS — K8080 Other cholelithiasis without obstruction: Secondary | ICD-10-CM | POA: Diagnosis not present

## 2014-03-11 ENCOUNTER — Encounter: Payer: Self-pay | Admitting: Cardiology

## 2014-03-11 ENCOUNTER — Ambulatory Visit (INDEPENDENT_AMBULATORY_CARE_PROVIDER_SITE_OTHER): Payer: Medicare Other | Admitting: Cardiology

## 2014-03-11 VITALS — BP 110/68 | HR 92 | Ht 64.0 in | Wt 136.5 lb

## 2014-03-11 DIAGNOSIS — Z48815 Encounter for surgical aftercare following surgery on the digestive system: Secondary | ICD-10-CM | POA: Diagnosis not present

## 2014-03-11 DIAGNOSIS — Z23 Encounter for immunization: Secondary | ICD-10-CM | POA: Diagnosis not present

## 2014-03-11 DIAGNOSIS — K8080 Other cholelithiasis without obstruction: Secondary | ICD-10-CM | POA: Diagnosis not present

## 2014-03-11 DIAGNOSIS — I4892 Unspecified atrial flutter: Secondary | ICD-10-CM

## 2014-03-11 DIAGNOSIS — M1990 Unspecified osteoarthritis, unspecified site: Secondary | ICD-10-CM | POA: Diagnosis not present

## 2014-03-11 DIAGNOSIS — M4806 Spinal stenosis, lumbar region: Secondary | ICD-10-CM | POA: Diagnosis not present

## 2014-03-11 DIAGNOSIS — G609 Hereditary and idiopathic neuropathy, unspecified: Secondary | ICD-10-CM | POA: Diagnosis not present

## 2014-03-11 NOTE — Addendum Note (Signed)
Addended by: Janett Labella A on: 03/11/2014 04:25 PM   Modules accepted: Orders

## 2014-03-11 NOTE — Telephone Encounter (Signed)
Spoke to the pt.  Informed the pt that she has had the Prevnar vaccine on 10/23/13.  She informed me that she also had one today at [facility] on [address].  Noted in the patient's chart.

## 2014-03-11 NOTE — Patient Instructions (Signed)
Your physician has recommended that you wear an event monitor for 30 days . Event monitors are medical devices that record the heart's electrical activity. Doctors most often [place] these monitors to diagnose arrhythmias. Arrhythmias are problems with the speed or rhythm of the heartbeat. The monitor is a small, portable device. You can wear one while you do your normal daily activities. This is usually used to diagnose what is causing palpitations/syncope (passing out).  Your physician recommends that you schedule a follow-up appointment in: 5 weeks with Dr. Mare Ferrari or an extender.

## 2014-03-11 NOTE — Progress Notes (Signed)
03/11/2014 Susan Ford   1929/04/29  [id number]  Primary Physician Lottie Dawson, MD Primary Cardiologist: Dr.Brackbill  HPI:  The patient is an 80 year old female, fairly new to our practice. She was seen recently by Dr. Mare Ferrari in consultation during a recent admission to [facility] where she was treated for choledocholithiasis. While undergoing treatment, she went into atrial flutter with a rapid ventricular response. She was placed on IV diltiazem and converted to normal sinus rhythm. A 2-D echocardiogram was obtained revealing normal systolic function with an estimated ejection fraction of 55-60%. Wall motion was normal without abnormality. She was eventually transitioned to PO Cardizem. Despite an elevated CHADS VASC score of 3 (female sex and age > 89), it was felt that she would not need long-term anticoagulation, as it was felt that her arrhythmia was likely subsequent to her acute illness. However, prior to discharge, it was recommended that she be sent home with a 30 day event monitor to evaluate for recurrent arrhythmias (this was not arranged). She was discharged home on 120 mg of diltiazem and 81 mg of aspirin.  She presents today for post hospital follow-up. She states that she has done fairly well since being discharged from the hospital. She denies any awareness of palpitations, however she states that she was also completely asymptomatic during the time that she was in atrial fibrillation while in the hospital. She denies chest pain, dyspnea, syncope/near-syncope. She has had no further abdominal pain after undergoing cholecystectomy. She reports full medication compliance. Her EKG today demonstrates NSR. HR 92 bpm.    Current Outpatient Prescriptions  Medication Sig Dispense Refill  . ascorbic acid (VITAMIN C) 1000 MG tablet Take 1,000 mg by mouth daily.    Marland Kitchen aspirin 81 MG tablet Take 81 mg by mouth daily.    . calcium citrate-vitamin D (CITRACAL+D)  315-200 MG-UNIT per tablet Take 1 tablet by mouth daily.      . celecoxib (CELEBREX) 200 MG capsule Take 1 capsule (200 mg total) by mouth daily as needed (arthritis pain). 30 capsule 6  . diltiazem (CARDIZEM CD) 120 MG 24 hr capsule Take 1 capsule (120 mg total) by mouth daily. 30 capsule 0  . estradiol (VIVELLE-DOT) 0.025 MG/24HR Place 1 patch onto the skin once a week. On Saturday    . famotidine (PEPCID) 40 MG tablet Take 40 mg by mouth every morning.     . gabapentin (NEURONTIN) 600 MG tablet Take 1 tablet (600 mg total) by mouth 4 (four) times daily. 130 tablet 11  . HYDROcodone-acetaminophen (NORCO) 7.5-325 MG per tablet Take 1 tablet by mouth every 6 (six) hours as needed for moderate pain. 40 tablet 0  . Multiple Vitamin (MULTIVITAMIN) tablet Take 1 tablet by mouth daily.     . simvastatin (ZOCOR) 20 MG tablet Take 20 mg by mouth at bedtime.     No current facility-administered medications for this visit.    Allergies  Allergen Reactions  . Lyrica [Pregabalin] Other (See Comments)    "High Fever"   . Oxytetracycline Other (See Comments)    "High Fever"    History   Social History  . Marital Status: Married    Spouse Name: Gwyndolyn Saxon    Number of Children: N/A  . Years of Education: N/A   Occupational History  . Retired     Therapist, sports   Social History Main Topics  . Smoking status: Never Smoker   . Smokeless tobacco: Never Used  . Alcohol Use: 4.2 oz/week  7 Glasses of wine per week  . Drug Use: No  . Sexual Activity: Yes   Other Topics Concern  . Not on file   Social History Narrative   Retired Therapist, sports    married   [place] of 2    Regular exercise former smoker   G3P3           Review of Systems: General: negative for chills, fever, night sweats or weight changes.  Cardiovascular: negative for chest pain, dyspnea on exertion, edema, orthopnea, palpitations, paroxysmal nocturnal dyspnea or shortness of breath Dermatological: negative for rash Respiratory: negative for  cough or wheezing Urologic: negative for hematuria Abdominal: negative for nausea, vomiting, diarrhea, bright red blood per rectum, melena, or hematemesis Neurologic: negative for visual changes, syncope, or dizziness All other systems reviewed and are otherwise negative except as noted above.    Blood pressure 110/68, pulse 92, height 5\' 4"  (1.626 m), weight 136 lb 8 oz (61.916 kg).  General appearance: alert, cooperative and no distress Neck: no carotid bruit and no JVD Lungs: clear to auscultation bilaterally Heart: regular rate and rhythm, S1, S2 normal, no murmur, click, rub or gallop Extremities: no LEE Pulses: 2+ and symmetric Skin: warm and dry Neurologic: Grossly normal  EKG Normal sinus rhythm. Heart rate 92 bpm.  ASSESSMENT AND PLAN:   1. Atrial flutter: patient was recently documented to have atrial flutter with RVR in the setting of acute illness (choledocholithiasis). She converted to normal sinus rhythm in the hospital with IV Cardizem. Her EKG today demonstrates normal sinus rhythm. She denies any awareness of arrhythmias since discharge, however she also admits that she was also completely asymptomatic at the time that she was noted to be in atrial flutter. It was recommended prior to discharge that she go home with a 30 day event monitor to evaluate for recurrent arrhythmias, however this was not arranged. We will arrange for a 30 day event monitor today. For now, continue current medical regimen, 120 mg of Cardizem daily as well as 81 mg of aspirin daily. If cardiac event monitoring captures any recurrent atrial flutter/fibrillation, she will need to be fully anticoagulated with either warfarin or a NOAC, given a CHADS VASC score of 3 (female sex and age > 13).  PLAN  Continue daily Cardizem and aspirin. Will arrange for 30 day event monitor. Follow-up with either Dr. Mare Ferrari or an extender in 4-5 weeks for reassessment.  SIMMONS, BRITTAINYPA-C 03/11/2014 3:09 PM

## 2014-03-15 DIAGNOSIS — G609 Hereditary and idiopathic neuropathy, unspecified: Secondary | ICD-10-CM | POA: Diagnosis not present

## 2014-03-15 DIAGNOSIS — K8080 Other cholelithiasis without obstruction: Secondary | ICD-10-CM | POA: Diagnosis not present

## 2014-03-15 DIAGNOSIS — M1990 Unspecified osteoarthritis, unspecified site: Secondary | ICD-10-CM | POA: Diagnosis not present

## 2014-03-15 DIAGNOSIS — M4806 Spinal stenosis, lumbar region: Secondary | ICD-10-CM | POA: Diagnosis not present

## 2014-03-15 DIAGNOSIS — Z48815 Encounter for surgical aftercare following surgery on the digestive system: Secondary | ICD-10-CM | POA: Diagnosis not present

## 2014-03-15 DIAGNOSIS — I4892 Unspecified atrial flutter: Secondary | ICD-10-CM | POA: Diagnosis not present

## 2014-03-26 ENCOUNTER — Other Ambulatory Visit: Payer: Self-pay | Admitting: Cardiology

## 2014-03-28 DIAGNOSIS — K8050 Calculus of bile duct without cholangitis or cholecystitis without obstruction: Secondary | ICD-10-CM | POA: Diagnosis not present

## 2014-04-01 ENCOUNTER — Telehealth: Payer: Self-pay | Admitting: Cardiology

## 2014-04-01 ENCOUNTER — Ambulatory Visit: Payer: Medicare Other | Admitting: Internal Medicine

## 2014-04-01 NOTE — Telephone Encounter (Signed)
Patient called, needed clarification on whether to put event monitor in mail slot or UPS box. Instructed pt to put in UPS box. No other concerns stated by pt.

## 2014-04-01 NOTE — Telephone Encounter (Signed)
Pt wants to know what to do with her monitor? She said the nurse said to drop it in the mail box,instruction on the bag said send by UPS.Which one is correct?

## 2014-04-01 NOTE — Telephone Encounter (Signed)
Called, no answer.

## 2014-04-02 DIAGNOSIS — M47817 Spondylosis without myelopathy or radiculopathy, lumbosacral region: Secondary | ICD-10-CM | POA: Diagnosis not present

## 2014-04-08 ENCOUNTER — Ambulatory Visit (INDEPENDENT_AMBULATORY_CARE_PROVIDER_SITE_OTHER): Payer: Medicare Other | Admitting: Internal Medicine

## 2014-04-08 ENCOUNTER — Encounter: Payer: Self-pay | Admitting: Internal Medicine

## 2014-04-08 VITALS — BP 124/74 | Temp 98.3°F | Ht 63.0 in | Wt 136.1 lb

## 2014-04-08 DIAGNOSIS — Z79899 Other long term (current) drug therapy: Secondary | ICD-10-CM

## 2014-04-08 DIAGNOSIS — E785 Hyperlipidemia, unspecified: Secondary | ICD-10-CM | POA: Diagnosis not present

## 2014-04-08 DIAGNOSIS — D649 Anemia, unspecified: Secondary | ICD-10-CM | POA: Insufficient documentation

## 2014-04-08 DIAGNOSIS — G629 Polyneuropathy, unspecified: Secondary | ICD-10-CM

## 2014-04-08 DIAGNOSIS — Z634 Disappearance and death of family member: Secondary | ICD-10-CM | POA: Diagnosis not present

## 2014-04-08 DIAGNOSIS — F4321 Adjustment disorder with depressed mood: Secondary | ICD-10-CM | POA: Diagnosis not present

## 2014-04-08 DIAGNOSIS — M1990 Unspecified osteoarthritis, unspecified site: Secondary | ICD-10-CM

## 2014-04-08 NOTE — Patient Instructions (Addendum)
Condolences on loss of  Husband . Check into shingles vaccine ( Zostavax) reimbursement or cost to you  and can return at any time if call ahead for injection.  No labs needed today    Nl renal function .   No change in other meds   Lab Results  Component Value Date   CREATININE 0.54 02/26/2014   Wellness visit  And meds in 6 months or as needed

## 2014-04-08 NOTE — Progress Notes (Signed)
Pre visit review using our clinic review tool, if applicable. No additional management support is needed unless otherwise documented below in the visit note.  Chief Complaint  Patient presents with  . Follow-up    HPI: Susan Ford 80 y.o.  Fu meds  But  recnetly had acute illness  Had cholycystectomy and very bad    Here for fu of this . Remove this   Stent  Had  Aflutter  In hospital and  On cardizem to decide   On rx ,  Doing better now  Has event monitor on  ROS: See pertinent positives and negatives per HPI.no cp sob feels sad  Husband  Had pulm  Se of amiodarone.  collapsed suddenly witnessed and she did CPS And died from this se. passes away this year.  Past Medical History  Diagnosis Date  . GERD (gastroesophageal reflux disease)     Had endoscopy no history of Barrett's  . Arthritis   . Cataract 2007    bilateral cat ext   . Unspecified hereditary and idiopathic peripheral neuropathy   . Cholecystitis     Family History  Problem Relation Age of Onset  . Neurodegenerative disease Mother     Mikey Bussing Drager died at 72  . Alcohol abuse    . Heart disease      History   Social History  . Marital Status: Married    Spouse Name: Gwyndolyn Saxon    Number of Children: N/A  . Years of Education: N/A   Occupational History  . Retired     Therapist, sports   Social History Main Topics  . Smoking status: Never Smoker   . Smokeless tobacco: Never Used  . Alcohol Use: 4.2 oz/week    7 Glasses of wine per week  . Drug Use: No  . Sexual Activity: Yes   Other Topics Concern  . Not on file   Social History Narrative   Retired Therapist, sports    widowed since    Spring  15    [facility]    Regular exercise former smoker   G3P3   From [place] originally           Outpatient Encounter Prescriptions as of 04/08/2014  Medication Sig  . ascorbic acid (VITAMIN C) 1000 MG tablet Take 1,000 mg by mouth daily.  Marland Kitchen aspirin 81 MG tablet Take 81 mg by mouth daily.  . calcium citrate-vitamin D  (CITRACAL+D) 315-200 MG-UNIT per tablet Take 1 tablet by mouth daily.    . celecoxib (CELEBREX) 200 MG capsule Take 1 capsule (200 mg total) by mouth daily as needed (arthritis pain).  Marland Kitchen diltiazem (CARDIZEM CD) 120 MG 24 hr capsule TAKE ONE CAPSULE BY MOUTH EVERY DAY  . estradiol (VIVELLE-DOT) 0.025 MG/24HR Place 1 patch onto the skin once a week. On Saturday  . famotidine (PEPCID) 40 MG tablet Take 40 mg by mouth every morning.   . gabapentin (NEURONTIN) 600 MG tablet Take 1 tablet (600 mg total) by mouth 4 (four) times daily.  Marland Kitchen HYDROcodone-acetaminophen (NORCO) 7.5-325 MG per tablet Take 1 tablet by mouth every 6 (six) hours as needed for moderate pain.  . Multiple Vitamin (MULTIVITAMIN) tablet Take 1 tablet by mouth daily.   . simvastatin (ZOCOR) 20 MG tablet Take 20 mg by mouth at bedtime.    EXAM:  BP 124/74 mmHg  Temp(Src) 98.3 F (36.8 C) (Oral)  Ht 5\' 3"  (1.6 m)  Wt 136 lb 1.6 oz (61.735 kg)  BMI 24.12  kg/m2  Body mass index is 24.12 kg/(m^2).  GENERAL: vitals reviewed and listed above, alert, oriented, appears well hydrated and in no acute distress HEENT: atraumatic, conjunctiva  clear, no obvious abnormalities on inspection of external nose and ears OP : no lesion edema or exudate  NECK: no obvious masses on inspection palpation  LUNGS: clear to auscultation bilaterally, no wheezes, rales or rhonchi, good air movement CV: HRRR, no clubbing cyanosis or  peripheral edema nl cap refill  Abdomen:  Sof,t normal bowel sounds without hepatosplenomegaly, no guarding rebound or masses no CVA tenderness MS: moves all extremities without noticeable focal  abnormality PSYCH: pleasant and cooperative, no obvious depression or anxiety appropriate for the situation Lab Results  Component Value Date   WBC 8.3 02/26/2014   HGB 11.7* 02/26/2014   HCT 34.2* 02/26/2014   PLT 286 02/26/2014   GLUCOSE 103* 02/26/2014   CHOL 179 10/23/2013   TRIG 203.0* 10/23/2013   HDL 70.10 10/23/2013    LDLDIRECT 153 02/12/2009   LDLCALC 68 10/23/2013   ALT 59* 02/26/2014   AST 20 02/26/2014   NA 137 02/26/2014   K 3.6* 02/26/2014   CL 101 02/26/2014   CREATININE 0.54 02/26/2014   BUN 11 02/26/2014   CO2 25 02/26/2014   TSH 0.76 10/23/2013   INR 1.21 02/22/2014   BP Readings from Last 3 Encounters:  04/08/14 124/74  03/11/14 110/68  02/27/14 149/74    ASSESSMENT AND PLAN:  Discussed the following assessment and plan:  Arthritis  High risk medication use  Neuropathy, peripheral  Bereavement due to life event  Death of husband  Hyperlipidemia Disc med risk benefot and  Counseled. About loss  meds etc  Good support system  -Patient advised to return or notify health care team  if symptoms worsen ,persist or new concerns arise.  Patient Instructions   Condolences on loss of  Husband . Check into shingles vaccine ( Zostavax) reimbursement or cost to you  and can return at any time if call ahead for injection.  No labs needed today    Nl renal function .   No change in other meds   Lab Results  Component Value Date   CREATININE 0.54 02/26/2014   Wellness visit  And meds in 6 months or as needed    Standley Brooking. [name] M.D.  Total visit 39mins > 50% spent counseling and coordinating care

## 2014-04-09 ENCOUNTER — Telehealth: Payer: Self-pay | Admitting: Cardiology

## 2014-04-09 NOTE — Telephone Encounter (Signed)
Returned call to patient she stated she has been wearing monitor since 03/06/14.Advised her 30 days is up she can mail monitor back.

## 2014-04-09 NOTE — Telephone Encounter (Signed)
Pt said she had a monitor put on here.She have been wearing it 5 weeks,she wants to know if she can take it off? Please call before 11:00 if possible.She is Dr Mare Ferrari pt,but she said she had her monitor put on here.

## 2014-04-11 ENCOUNTER — Encounter: Payer: Self-pay | Admitting: Internal Medicine

## 2014-04-11 DIAGNOSIS — Z634 Disappearance and death of family member: Secondary | ICD-10-CM | POA: Insufficient documentation

## 2014-04-16 ENCOUNTER — Ambulatory Visit: Payer: Medicare Other | Admitting: Physician Assistant

## 2014-04-25 ENCOUNTER — Other Ambulatory Visit: Payer: Self-pay | Admitting: Neurology

## 2014-04-30 DIAGNOSIS — M545 Low back pain: Secondary | ICD-10-CM | POA: Diagnosis not present

## 2014-04-30 DIAGNOSIS — M47817 Spondylosis without myelopathy or radiculopathy, lumbosacral region: Secondary | ICD-10-CM | POA: Diagnosis not present

## 2014-05-06 ENCOUNTER — Encounter: Payer: Self-pay | Admitting: Cardiology

## 2014-05-06 ENCOUNTER — Ambulatory Visit (INDEPENDENT_AMBULATORY_CARE_PROVIDER_SITE_OTHER): Payer: Medicare Other | Admitting: Cardiology

## 2014-05-06 VITALS — BP 110/66 | HR 104 | Ht 64.0 in | Wt 135.0 lb

## 2014-05-06 DIAGNOSIS — I4892 Unspecified atrial flutter: Secondary | ICD-10-CM

## 2014-05-06 DIAGNOSIS — G629 Polyneuropathy, unspecified: Secondary | ICD-10-CM

## 2014-05-06 DIAGNOSIS — M545 Low back pain: Secondary | ICD-10-CM | POA: Diagnosis not present

## 2014-05-06 DIAGNOSIS — M47817 Spondylosis without myelopathy or radiculopathy, lumbosacral region: Secondary | ICD-10-CM | POA: Diagnosis not present

## 2014-05-06 NOTE — Progress Notes (Signed)
Susan Ford Date of Birth:  1929/12/28 [name] [address] 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  I had seen her during her recent hospitalization for cholelithiasis.  While hospitalized she had brief atrial flutter which occurred postoperatively and resolved while she was in the hospital.  She has no prior history of any atrial arrhythmias.  She did not require anticoagulation at discharge.  She had a subsequent outpatient 30 day monitor which did not show any recurrent atrial flutter or fibrillation.  She remains in normal sinus rhythm.  She denies any chest pain or shortness of breath.  Current Outpatient Prescriptions  Medication Sig Dispense Refill  . ascorbic acid (VITAMIN C) 1000 MG tablet Take 1,000 mg by mouth daily.    Marland Kitchen aspirin 81 MG tablet Take 81 mg by mouth daily.    . calcium citrate-vitamin D (CITRACAL+D) 315-200 MG-UNIT per tablet Take 1 tablet by mouth daily.      . celecoxib (CELEBREX) 200 MG capsule Take 1 capsule (200 mg total) by mouth daily as needed (arthritis pain). 30 capsule 6  . diltiazem (CARDIZEM CD) 120 MG 24 hr capsule TAKE ONE CAPSULE BY MOUTH EVERY DAY 30 capsule 1  . estradiol (VIVELLE-DOT) 0.025 MG/24HR Place 1 patch onto the skin once a week. On Saturday    . famotidine (PEPCID) 40 MG tablet Take 40 mg by mouth every morning.     . gabapentin (NEURONTIN) 600 MG tablet TAKE 1 TABLET BY MOUTH 4 TIMES A DAY 360 tablet 2  . HYDROcodone-acetaminophen (NORCO) 7.5-325 MG per tablet Take 1 tablet by mouth every 6 (six) hours as needed for moderate pain. 40 tablet 0  . Multiple Vitamin (MULTIVITAMIN) tablet Take 1 tablet by mouth daily.     . simvastatin (ZOCOR) 20 MG tablet Take 20 mg by mouth at bedtime.     No current facility-administered medications for this visit.    Allergies    Allergen Reactions  . Lyrica [Pregabalin] Other (See Comments)    "High Fever"   . Oxytetracycline Other (See Comments)    "High Fever"    Patient Active Problem List   Diagnosis Date Noted  . Death of husband 07-May-2014  . Bereavement due to life event 07-May-2014  . Absolute anemia 04/08/2014  . Atrial flutter 02/23/2014  . Acute calculous cholecystitis 02/21/2014  . Spinal stenosis of lumbar region 01/04/2014  . Recent bereavement 10/25/2013  . Arthritis 10/25/2013  . Hyperlipidemia 10/23/2013  . Unspecified hereditary and idiopathic peripheral neuropathy   . Nausea alone 07/14/2012  . Neuropathy, peripheral 05/05/2011  . High risk medication use 05/05/2011  . Subcutaneous emphysema 05/05/2011  . Acute respiratory infection 04/03/2011  . Cough 04/03/2011  . Neuropathy 04/03/2011  . DEGENERATIVE JOINT DISEASE 05/15/2010  . STIFFNESS OF JOINT NEC ANKLE AND FOOT 05/15/2010  . GANGLION CYST, WRIST, RIGHT 05/15/2010  . VERTIGO, BENIGN PAROXYSMAL POSITION 01/31/2007    History  Smoking status  . Never Smoker   Smokeless tobacco  . Never Used    History  Alcohol Use  . 4.2 oz/week  . 7 Glasses of wine per week    Family History  Problem Relation Age of Onset  . Neurodegenerative disease Mother     Mikey Bussing Drager died at 52  .  Alcohol abuse    . Heart disease      Review of Systems: Constitutional: no fever chills diaphoresis or fatigue or change in weight.  Head and neck: no hearing loss, no epistaxis, no photophobia or visual disturbance. Respiratory: No cough, shortness of breath or wheezing. Cardiovascular: No chest pain peripheral edema, palpitations. Gastrointestinal: No abdominal distention, no abdominal pain, no change in bowel habits hematochezia or melena. Genitourinary: No dysuria, no frequency, no urgency, no nocturia. Musculoskeletal:No arthralgias, no back pain, no gait disturbance or myalgias. Neurological: No dizziness, no headaches, no numbness,  no seizures, no syncope, no weakness, no tremors. Hematologic: No lymphadenopathy, no easy bruising. Psychiatric: No confusion, no hallucinations, no sleep disturbance.   Wt Readings from Last 3 Encounters:  05/06/14 135 lb (61.236 kg)  04/08/14 136 lb 1.6 oz (61.735 kg)  03/11/14 136 lb 8 oz (61.916 kg)    Physical Exam: Filed Vitals:   05/06/14 1536  BP: 110/66  Pulse: 104   the general appearance reveals a well-developed well-nourished woman in no distress. The patient appears to be in no distress.  Head and neck exam reveals that the pupils are equal and reactive.  The extraocular movements are full.  There is no scleral icterus.  Mouth and pharynx are benign.  No lymphadenopathy.  No carotid bruits.  The jugular venous pressure is normal.  Thyroid is not enlarged or tender.  Chest is clear to percussion and auscultation.  No rales or rhonchi.  Expansion of the chest is symmetrical.  Heart reveals no abnormal lift or heave.  First and second heart sounds are normal.  There is no murmur gallop rub or click.  The abdomen is soft and nontender.  Bowel sounds are normoactive.  There is no hepatosplenomegaly or mass.  There are no abdominal bruits.  Extremities reveal no phlebitis or edema.  Pedal pulses are good.  There is no cyanosis or clubbing.  Neurologic exam is normal strength and no lateralizing weakness.  No sensory deficits.  Integument reveals no rash  EKG today shows normal sinus rhythm at 100/min  and no ischemic changes.  Assessment / Plan: Perioperative atrial flutter, resolved Severe idiopathic peripheral neuropathy, on gabapentin Sinus tachycardia Bereavement due to death of husband from side effects of amiodarone.  Disposition: Continue current medication.  Recheck in one year for follow-up office visit.  I have recommended that she continue to take diltiazem CD 120 mg one daily and she is tolerating it without side effects.

## 2014-05-06 NOTE — Patient Instructions (Signed)
Your physician recommends that you continue on your current medications as directed. Please refer to the Current Medication list given to you today.  Your physician wants you to follow-up in: 1 year ov You will receive a reminder letter in the mail two months in advance. If you don't receive a letter, please call our office to schedule the follow-up appointment.  

## 2014-05-07 ENCOUNTER — Ambulatory Visit
Admission: RE | Admit: 2014-05-07 | Discharge: 2014-05-07 | Disposition: A | Discharge status: Home / Self Care | Payer: Medicare Other | Source: Ambulatory Visit

## 2014-05-07 DIAGNOSIS — Z1231 Encounter for screening mammogram for malignant neoplasm of breast: Secondary | ICD-10-CM | POA: Diagnosis not present

## 2014-05-08 ENCOUNTER — Other Ambulatory Visit: Payer: Self-pay | Admitting: Obstetrics and Gynecology

## 2014-05-08 DIAGNOSIS — R928 Other abnormal and inconclusive findings on diagnostic imaging of breast: Secondary | ICD-10-CM

## 2014-05-15 DIAGNOSIS — N8189 Other female genital prolapse: Secondary | ICD-10-CM | POA: Diagnosis not present

## 2014-05-15 DIAGNOSIS — Z79890 Hormone replacement therapy (postmenopausal): Secondary | ICD-10-CM | POA: Diagnosis not present

## 2014-05-15 DIAGNOSIS — Z01419 Encounter for gynecological examination (general) (routine) without abnormal findings: Secondary | ICD-10-CM | POA: Diagnosis not present

## 2014-05-15 DIAGNOSIS — Z0000 Encounter for general adult medical examination without abnormal findings: Secondary | ICD-10-CM | POA: Diagnosis not present

## 2014-05-21 ENCOUNTER — Encounter (HOSPITAL_COMMUNITY): Payer: Self-pay | Admitting: *Deleted

## 2014-05-21 DIAGNOSIS — M545 Low back pain: Secondary | ICD-10-CM | POA: Diagnosis not present

## 2014-05-21 DIAGNOSIS — M47817 Spondylosis without myelopathy or radiculopathy, lumbosacral region: Secondary | ICD-10-CM | POA: Diagnosis not present

## 2014-05-24 ENCOUNTER — Ambulatory Visit
Admission: RE | Admit: 2014-05-24 | Discharge: 2014-05-24 | Disposition: A | Discharge status: Home / Self Care | Payer: Medicare Other | Source: Ambulatory Visit | Attending: Obstetrics and Gynecology | Admitting: Obstetrics and Gynecology

## 2014-05-24 ENCOUNTER — Other Ambulatory Visit: Payer: Self-pay | Admitting: Cardiology

## 2014-05-24 DIAGNOSIS — R921 Mammographic calcification found on diagnostic imaging of breast: Secondary | ICD-10-CM | POA: Diagnosis not present

## 2014-05-24 DIAGNOSIS — R928 Other abnormal and inconclusive findings on diagnostic imaging of breast: Secondary | ICD-10-CM

## 2014-05-27 ENCOUNTER — Other Ambulatory Visit: Payer: Self-pay | Admitting: Physical Medicine and Rehabilitation

## 2014-05-27 DIAGNOSIS — M545 Low back pain: Secondary | ICD-10-CM

## 2014-05-28 ENCOUNTER — Other Ambulatory Visit: Payer: Self-pay | Admitting: Gastroenterology

## 2014-05-28 DIAGNOSIS — K8050 Calculus of bile duct without cholangitis or cholecystitis without obstruction: Secondary | ICD-10-CM | POA: Diagnosis not present

## 2014-05-28 NOTE — Addendum Note (Signed)
Addended by: Catalea Labrecque on: 05/28/2014 12:24 PM   Modules accepted: Orders  

## 2014-05-29 ENCOUNTER — Encounter (HOSPITAL_COMMUNITY)
Admission: RE | Disposition: A | Discharge status: Home / Self Care | Payer: Self-pay | Source: Ambulatory Visit | Attending: Gastroenterology

## 2014-05-29 ENCOUNTER — Ambulatory Visit (HOSPITAL_COMMUNITY): Payer: Medicare Other | Admitting: Anesthesiology

## 2014-05-29 ENCOUNTER — Encounter (HOSPITAL_COMMUNITY): Payer: Self-pay

## 2014-05-29 ENCOUNTER — Ambulatory Visit (HOSPITAL_COMMUNITY): Payer: Medicare Other

## 2014-05-29 ENCOUNTER — Ambulatory Visit (HOSPITAL_COMMUNITY)
Admission: RE | Admit: 2014-05-29 | Discharge: 2014-05-29 | Disposition: A | Discharge status: Home / Self Care | Payer: Medicare Other | Source: Ambulatory Visit | Attending: Gastroenterology | Admitting: Gastroenterology

## 2014-05-29 DIAGNOSIS — K219 Gastro-esophageal reflux disease without esophagitis: Secondary | ICD-10-CM | POA: Insufficient documentation

## 2014-05-29 DIAGNOSIS — Z87891 Personal history of nicotine dependence: Secondary | ICD-10-CM | POA: Insufficient documentation

## 2014-05-29 DIAGNOSIS — K828 Other specified diseases of gallbladder: Secondary | ICD-10-CM | POA: Diagnosis not present

## 2014-05-29 DIAGNOSIS — K8050 Calculus of bile duct without cholangitis or cholecystitis without obstruction: Secondary | ICD-10-CM | POA: Diagnosis not present

## 2014-05-29 DIAGNOSIS — R933 Abnormal findings on diagnostic imaging of other parts of digestive tract: Secondary | ICD-10-CM | POA: Diagnosis not present

## 2014-05-29 DIAGNOSIS — I4891 Unspecified atrial fibrillation: Secondary | ICD-10-CM | POA: Diagnosis not present

## 2014-05-29 DIAGNOSIS — M1990 Unspecified osteoarthritis, unspecified site: Secondary | ICD-10-CM | POA: Diagnosis not present

## 2014-05-29 HISTORY — DX: Adverse effect of unspecified anesthetic, initial encounter: T41.45XA

## 2014-05-29 HISTORY — PX: SPYGLASS CHOLANGIOSCOPY: SHX5441

## 2014-05-29 HISTORY — DX: Cardiac arrhythmia, unspecified: I49.9

## 2014-05-29 HISTORY — PX: ENDOSCOPIC RETROGRADE CHOLANGIOPANCREATOGRAPHY (ERCP) WITH PROPOFOL: SHX5810

## 2014-05-29 SURGERY — ENDOSCOPIC RETROGRADE CHOLANGIOPANCREATOGRAPHY (ERCP) WITH PROPOFOL
Anesthesia: General

## 2014-05-29 MED ORDER — PROPOFOL 10 MG/ML IV BOLUS
INTRAVENOUS | Status: DC | PRN
  Administered 2014-05-29: 130 mg via INTRAVENOUS

## 2014-05-29 MED ORDER — PROPOFOL 10 MG/ML IV BOLUS
INTRAVENOUS | Status: AC
  Filled 2014-05-29: qty 20

## 2014-05-29 MED ORDER — GLUCAGON HCL RDNA (DIAGNOSTIC) 1 MG IJ SOLR
INTRAMUSCULAR | Status: AC
  Filled 2014-05-29: qty 1

## 2014-05-29 MED ORDER — FENTANYL CITRATE 0.05 MG/ML IJ SOLN
INTRAMUSCULAR | Status: DC | PRN
  Administered 2014-05-29: 25 ug via INTRAVENOUS

## 2014-05-29 MED ORDER — LIDOCAINE HCL (CARDIAC) 20 MG/ML IV SOLN
INTRAVENOUS | Status: AC
  Filled 2014-05-29: qty 5

## 2014-05-29 MED ORDER — FENTANYL CITRATE 0.05 MG/ML IJ SOLN
INTRAMUSCULAR | Status: AC
  Filled 2014-05-29: qty 2

## 2014-05-29 MED ORDER — PHENYLEPHRINE HCL 10 MG/ML IJ SOLN
INTRAMUSCULAR | Status: DC | PRN
  Administered 2014-05-29: 80 ug via INTRAVENOUS

## 2014-05-29 MED ORDER — CIPROFLOXACIN IN D5W 400 MG/200ML IV SOLN
400.0000 mg | Freq: Two times a day (BID) | INTRAVENOUS | Status: DC
  Administered 2014-05-29: 400 mg via INTRAVENOUS

## 2014-05-29 MED ORDER — SODIUM CHLORIDE 0.9 % IV SOLN: INTRAVENOUS | Status: DC

## 2014-05-29 MED ORDER — CIPROFLOXACIN IN D5W 400 MG/200ML IV SOLN
INTRAVENOUS | Status: AC
  Filled 2014-05-29: qty 200

## 2014-05-29 MED ORDER — SODIUM CHLORIDE 0.9 % IV SOLN
INTRAVENOUS | Status: DC | PRN
  Administered 2014-05-29: 20 mL

## 2014-05-29 MED ORDER — SUCCINYLCHOLINE CHLORIDE 20 MG/ML IJ SOLN
INTRAMUSCULAR | Status: DC | PRN
  Administered 2014-05-29: 100 mg via INTRAVENOUS

## 2014-05-29 MED ORDER — ONDANSETRON HCL 4 MG/2ML IJ SOLN
INTRAMUSCULAR | Status: DC | PRN
  Administered 2014-05-29: 4 mg via INTRAVENOUS

## 2014-05-29 MED ORDER — LIDOCAINE HCL (CARDIAC) 20 MG/ML IV SOLN
INTRAVENOUS | Status: DC | PRN
  Administered 2014-05-29: 50 mg via INTRAVENOUS

## 2014-05-29 MED ORDER — LACTATED RINGERS IV SOLN
INTRAVENOUS | Status: DC
  Administered 2014-05-29: 1000 mL via INTRAVENOUS

## 2014-05-29 MED ORDER — PHENYLEPHRINE 40 MCG/ML (10ML) SYRINGE FOR IV PUSH (FOR BLOOD PRESSURE SUPPORT)
PREFILLED_SYRINGE | INTRAVENOUS | Status: AC
  Filled 2014-05-29: qty 10

## 2014-05-29 MED ORDER — DEXAMETHASONE SODIUM PHOSPHATE 10 MG/ML IJ SOLN
INTRAMUSCULAR | Status: DC | PRN
  Administered 2014-05-29: 10 mg via INTRAVENOUS

## 2014-05-29 NOTE — Discharge Instructions (Signed)
Endoscopic Retrograde Cholangiopancreatography (ERCP), Care After Refer to this sheet in the next few weeks. These instructions provide you with information on caring for yourself after your procedure. Your health care provider may also give you more specific instructions. Your treatment has been planned according to current medical practices, but problems sometimes occur. Call your health care provider if you have any problems or questions after your procedure.  WHAT TO EXPECT AFTER THE PROCEDURE  After your procedure, it is typical to feel:   Soreness in your throat.   Sick to your stomach (nauseous).   Bloated.  Dizzy.   Fatigued. HOME CARE INSTRUCTIONS  Have a friend or family member stay with you for the first 24 hours after your procedure.  Start taking your usual medicines and eating normally as soon as you feel well enough to do so or as directed by your health care provider. SEEK MEDICAL CARE IF:  You have abdominal pain.   You develop signs of infection, such as:   Chills.   Feeling unwell.  SEEK IMMEDIATE MEDICAL CARE IF:  You have difficulty swallowing.  You have worsening throat, chest, or abdominal pain.  You vomit.  You have bloody or very black stools.  You have a fever. Document Released: 01/31/2013 Document Reviewed: 01/31/2013 [facility] Patient Information 2015 [place], [place]. This information is not intended to replace advice given to you by your health care provider. Make sure you discuss any questions you have with your health care provider.    General Anesthesia, Care After Refer to this sheet in the next few weeks. These instructions provide you with information on caring for yourself after your procedure. Your health care provider may also give you more specific instructions. Your treatment has been planned according to current medical practices, but problems sometimes occur. Call your health care provider if you have any problems or  questions after your procedure. WHAT TO EXPECT AFTER THE PROCEDURE After the procedure, it is typical to experience:  Sleepiness.  Nausea and vomiting. HOME CARE INSTRUCTIONS  For the first 24 hours after general anesthesia:  Have a responsible person with you.  Do not drive a car. If you are alone, do not take public transportation.  Do not drink alcohol.  Do not take medicine that has not been prescribed by your health care provider.  Do not sign important papers or make important decisions.  You may resume a normal diet and activities as directed by your health care provider.  Change bandages (dressings) as directed.  If you have questions or problems that seem related to general anesthesia, call the hospital and ask for the anesthetist or anesthesiologist on call. SEEK MEDICAL CARE IF:  You have nausea and vomiting that continue the day after anesthesia.  You develop a rash. SEEK IMMEDIATE MEDICAL CARE IF:   You have difficulty breathing.  You have chest pain.  You have any allergic problems. Document Released: 07/19/2000 Document Revised: 04/17/2013 Document Reviewed: 10/26/2012 [facility] Patient Information 2015 [place], [place]. This information is not intended to replace advice given to you by your health care provider. Make sure you discuss any questions you have with your health care provider.

## 2014-05-29 NOTE — Anesthesia Procedure Notes (Signed)
Procedure Name: Intubation Date/Time: 05/29/2014 11:26 AM Performed by: Dimas Millin, Thamar Holik F Pre-anesthesia Checklist: Patient identified, Emergency Drugs available, Suction available, Patient being monitored and Timeout performed Patient Re-evaluated:Patient Re-evaluated prior to inductionOxygen Delivery Method: Circle system utilized Preoxygenation: Pre-oxygenation with 100% oxygen Intubation Type: IV induction Ventilation: Mask ventilation without difficulty Laryngoscope Size: Mac and 3 Grade View: Grade I Tube type: Oral Tube size: 7.0 mm Number of attempts: 1 Placement Confirmation: ETT inserted through vocal cords under direct vision,  positive ETCO2 and breath sounds checked- equal and bilateral Secured at: 22 cm Tube secured with: Tape Dental Injury: Teeth and Oropharynx as per pre-operative assessment

## 2014-05-29 NOTE — Op Note (Signed)
[facility] [place], [id number]   ERCP PROCEDURE REPORT        EXAM DATE: 05/29/2014  PATIENT NAME:          Susan, Ford          MR #: [id number] BIRTHDATE:       Apr 24, 1930     VISIT #:     [phone] ATTENDING:     Arta Silence, MD     STATUS:     outpatient REFERRING MD:       Burnis Medin, M.D.  INDICATIONS:  The patient is a 81 yr old female here for an ERCP due to bile duct stones. PROCEDURE PERFORMED:     ERCP with foreign body removal ERCP with sphincterotomy/papillotomy MEDICATIONS:     General endotracheal anesthesia and ciprofloxacin 400 mg IV  CONSENT: The patient understands the risks and benefits of the procedure and understands that these risks include, but are not limited to: sedation, allergic reaction, infection, perforation and/or bleeding. Alternative means of evaluation and treatment include, among others: physical exam, x-rays, and/or surgical intervention. The patient elects to proceed with this endoscopic procedure.  DESCRIPTION OF PROCEDURE: Informed was verified, confirmed and timeout was successfully executed by the treatment team. With the patient in left semi-prone position, medications were administered intravenously.The side-viewing duodenoscope      was passed from the mouth into the esophagus and further advanced from the esophagus into the stomach. From stomach scope was directed to the second portion of the duodenum.  Major papilla was aligned with the duodenoscope. The scope position was confirmed fluoroscopically.  Medium-sized periampullary diverticulum was seen.  Previously placed pancreatic stent had migrated out since last procedure, as was the intention. Previoulsy placed bile duct stent was in appropriate position, and was removed with snare.  Deep biliary access was readily obtained. Bile duct was about 49mm in maximal diameter.  Post-cholecystectomy. Possible residual distal  filling defect was seen, so the biliary sphincterotomy was slightly extended with blended current without immediate complication.  Balloon and basket catheters trawled the duct several times, without stone extraction.  Post-occlusion cholangiogram showed no obvious residual filling defects.  Bile flow through the ampulla was brisk post-procedure.  Pancreatogram was not obtained, intentionally.      ADVERSE EVENT:     None IMPRESSIONS:     As above.  No evidence of residual bile duct stones.  RECOMMENDATONS:     1.  Watch for potential complications. 2.  Advance diet as tolerated. 3.  Follow-up with [facility] on as-needed basis.    _________________________________ Arta Silence, MD eSigned:  Arta Silence, MD 05/29/2014 12:19 PM  cc:

## 2014-05-29 NOTE — Transfer of Care (Signed)
Immediate Anesthesia Transfer of Care Note  Patient: Susan Ford  Procedure(s) Performed: Procedure(s): ENDOSCOPIC RETROGRADE CHOLANGIOPANCREATOGRAPHY (ERCP) WITH PROPOFOL (N/A) SPYGLASS CHOLANGIOSCOPY (N/A)  Patient Location: PACU  Anesthesia Type:General  Level of Consciousness: sedated  Airway & Oxygen Therapy: Patient Spontanous Breathing and Patient connected to face mask oxygen  Post-op Assessment: Report given to RN and Post -op Vital signs reviewed and stable  Post vital signs: Reviewed and stable  Last Vitals:  Filed Vitals:   05/29/14 1004  BP: 126/71  Pulse: 94  Temp: 36.6 C  Resp: 14    Complications: No apparent anesthesia complications

## 2014-05-29 NOTE — Anesthesia Postprocedure Evaluation (Signed)
  Anesthesia Post-op Note  Patient: Susan Ford  Procedure(s) Performed: Procedure(s) (LRB): ENDOSCOPIC RETROGRADE CHOLANGIOPANCREATOGRAPHY (ERCP) WITH PROPOFOL (N/A) SPYGLASS CHOLANGIOSCOPY (N/A)  Patient Location: PACU  Anesthesia Type: General  Level of Consciousness: awake and alert   Airway and Oxygen Therapy: Patient Spontanous Breathing  Post-op Pain: mild  Post-op Assessment: Post-op Vital signs reviewed, Patient's Cardiovascular Status Stable, Respiratory Function Stable, Patent Airway and No signs of Nausea or vomiting  Last Vitals:  Filed Vitals:   05/29/14 1250  BP: 129/68  Pulse: 71  Temp:   Resp: 14    Post-op Vital Signs: stable   Complications: No apparent anesthesia complications

## 2014-05-29 NOTE — Anesthesia Preprocedure Evaluation (Signed)
Anesthesia Evaluation  Patient identified by MRN, date of birth, ID band Patient awake    Reviewed: Allergy & Precautions, NPO status , Patient's Chart, lab work & pertinent test results  History of Anesthesia Complications (+) history of anesthetic complications  Airway Mallampati: II  TM Distance: >3 FB Neck ROM: Full    Dental no notable dental hx.    Pulmonary neg pulmonary ROS, former smoker,  breath sounds clear to auscultation  Pulmonary exam normal       Cardiovascular + dysrhythmias Atrial Fibrillation Rhythm:Regular Rate:Normal     Neuro/Psych  Neuromuscular disease negative psych ROS   GI/Hepatic Neg liver ROS, GERD-  Medicated,  Endo/Other  negative endocrine ROS  Renal/GU negative Renal ROS  negative genitourinary   Musculoskeletal  (+) Arthritis -,   Abdominal   Peds negative pediatric ROS (+)  Hematology  (+) anemia ,   Anesthesia Other Findings   Reproductive/Obstetrics negative OB ROS                             Anesthesia Physical Anesthesia Plan  ASA: III  Anesthesia Plan: General   Post-op Pain Management:    Induction: Intravenous  Airway Management Planned: Oral ETT  Additional Equipment:   Intra-op Plan:   Post-operative Plan: Extubation in OR  Informed Consent: I have reviewed the patients History and Physical, chart, labs and discussed the procedure including the risks, benefits and alternatives for the proposed anesthesia with the patient or authorized representative who has indicated his/her understanding and acceptance.   Dental advisory given  Plan Discussed with: CRNA  Anesthesia Plan Comments:         Anesthesia Quick Evaluation

## 2014-05-29 NOTE — H&P (Signed)
Patient interval history reviewed.  Patient examined again.  There has been no change from documented H/P dated 05/28/14 (scanned into chart from our office) except as documented above.  Assessment:  1.  Bile duct stones.  Plan:  1.  Endoscopic retrograde cholangiopancreatography with attempted bile duct stone clearance. 2.  Risks (up to and including bleeding, infection, perforation, pancreatitis that can be complicated by infected necrosis and death), benefits (removal of stones, alleviating blockage, decreasing risk of cholangitis or choledocholithiasis-related pancreatitis), and alternatives (watchful waiting, percutaneous transhepatic cholangiography) of ERCP were explained to patient/family in detail and patient elects to proceed.

## 2014-05-30 ENCOUNTER — Encounter (HOSPITAL_COMMUNITY): Payer: Self-pay | Admitting: Gastroenterology

## 2014-06-05 ENCOUNTER — Telehealth: Payer: Self-pay | Admitting: Internal Medicine

## 2014-06-05 MED ORDER — SIMVASTATIN 20 MG PO TABS: 20.0000 mg | ORAL_TABLET | Freq: Every day | ORAL | Status: DC

## 2014-06-05 NOTE — Telephone Encounter (Signed)
Sent to the pharmacy by e-scribe. 

## 2014-06-05 NOTE — Telephone Encounter (Signed)
Pt request refill of the following: simvastatin (ZOCOR) 20 MG tablet      Phamacy: [facility] [address]

## 2014-06-06 ENCOUNTER — Ambulatory Visit
Admission: RE | Admit: 2014-06-06 | Discharge: 2014-06-06 | Disposition: A | Discharge status: Home / Self Care | Payer: Medicare Other | Source: Ambulatory Visit | Attending: Physical Medicine and Rehabilitation | Admitting: Physical Medicine and Rehabilitation

## 2014-06-06 DIAGNOSIS — M4806 Spinal stenosis, lumbar region: Secondary | ICD-10-CM | POA: Diagnosis not present

## 2014-06-06 DIAGNOSIS — M545 Low back pain: Secondary | ICD-10-CM

## 2014-06-06 DIAGNOSIS — M47816 Spondylosis without myelopathy or radiculopathy, lumbar region: Secondary | ICD-10-CM | POA: Diagnosis not present

## 2014-06-06 DIAGNOSIS — M5126 Other intervertebral disc displacement, lumbar region: Secondary | ICD-10-CM | POA: Diagnosis not present

## 2014-06-10 ENCOUNTER — Other Ambulatory Visit: Payer: Self-pay | Admitting: Internal Medicine

## 2014-06-10 NOTE — Telephone Encounter (Signed)
Sent to the pharmacy by e-scribe. 

## 2014-06-11 DIAGNOSIS — Z961 Presence of intraocular lens: Secondary | ICD-10-CM | POA: Diagnosis not present

## 2014-06-13 DIAGNOSIS — M47817 Spondylosis without myelopathy or radiculopathy, lumbosacral region: Secondary | ICD-10-CM | POA: Diagnosis not present

## 2014-06-13 DIAGNOSIS — M545 Low back pain: Secondary | ICD-10-CM | POA: Diagnosis not present

## 2014-06-21 DIAGNOSIS — M4306 Spondylolysis, lumbar region: Secondary | ICD-10-CM | POA: Diagnosis not present

## 2014-06-21 DIAGNOSIS — M545 Low back pain: Secondary | ICD-10-CM | POA: Diagnosis not present

## 2014-06-24 DIAGNOSIS — M545 Low back pain: Secondary | ICD-10-CM | POA: Diagnosis not present

## 2014-06-24 DIAGNOSIS — M4306 Spondylolysis, lumbar region: Secondary | ICD-10-CM | POA: Diagnosis not present

## 2014-06-27 DIAGNOSIS — M4306 Spondylolysis, lumbar region: Secondary | ICD-10-CM | POA: Diagnosis not present

## 2014-06-27 DIAGNOSIS — M545 Low back pain: Secondary | ICD-10-CM | POA: Diagnosis not present

## 2014-07-04 DIAGNOSIS — M4306 Spondylolysis, lumbar region: Secondary | ICD-10-CM | POA: Diagnosis not present

## 2014-07-04 DIAGNOSIS — M545 Low back pain: Secondary | ICD-10-CM | POA: Diagnosis not present

## 2014-07-08 DIAGNOSIS — M545 Low back pain: Secondary | ICD-10-CM | POA: Diagnosis not present

## 2014-07-08 DIAGNOSIS — M4306 Spondylolysis, lumbar region: Secondary | ICD-10-CM | POA: Diagnosis not present

## 2014-07-10 DIAGNOSIS — M4306 Spondylolysis, lumbar region: Secondary | ICD-10-CM | POA: Diagnosis not present

## 2014-07-10 DIAGNOSIS — M545 Low back pain: Secondary | ICD-10-CM | POA: Diagnosis not present

## 2014-07-15 DIAGNOSIS — M545 Low back pain: Secondary | ICD-10-CM | POA: Diagnosis not present

## 2014-07-15 DIAGNOSIS — M961 Postlaminectomy syndrome, not elsewhere classified: Secondary | ICD-10-CM | POA: Diagnosis not present

## 2014-07-15 DIAGNOSIS — M4726 Other spondylosis with radiculopathy, lumbar region: Secondary | ICD-10-CM | POA: Diagnosis not present

## 2014-07-15 DIAGNOSIS — M4306 Spondylolysis, lumbar region: Secondary | ICD-10-CM | POA: Diagnosis not present

## 2014-07-17 DIAGNOSIS — M545 Low back pain: Secondary | ICD-10-CM | POA: Diagnosis not present

## 2014-07-17 DIAGNOSIS — M4306 Spondylolysis, lumbar region: Secondary | ICD-10-CM | POA: Diagnosis not present

## 2014-07-25 DIAGNOSIS — M4306 Spondylolysis, lumbar region: Secondary | ICD-10-CM | POA: Diagnosis not present

## 2014-07-25 DIAGNOSIS — M545 Low back pain: Secondary | ICD-10-CM | POA: Diagnosis not present

## 2014-08-12 DIAGNOSIS — M545 Low back pain: Secondary | ICD-10-CM | POA: Diagnosis not present

## 2014-08-12 DIAGNOSIS — G609 Hereditary and idiopathic neuropathy, unspecified: Secondary | ICD-10-CM | POA: Diagnosis not present

## 2014-08-12 DIAGNOSIS — M47816 Spondylosis without myelopathy or radiculopathy, lumbar region: Secondary | ICD-10-CM | POA: Diagnosis not present

## 2014-08-14 ENCOUNTER — Ambulatory Visit (INDEPENDENT_AMBULATORY_CARE_PROVIDER_SITE_OTHER): Payer: Medicare Other | Admitting: Neurology

## 2014-08-14 ENCOUNTER — Encounter: Payer: Self-pay | Admitting: Neurology

## 2014-08-14 VITALS — BP 129/72 | HR 92 | Ht 64.0 in | Wt 136.0 lb

## 2014-08-14 DIAGNOSIS — M4806 Spinal stenosis, lumbar region: Secondary | ICD-10-CM | POA: Diagnosis not present

## 2014-08-14 DIAGNOSIS — G609 Hereditary and idiopathic neuropathy, unspecified: Secondary | ICD-10-CM | POA: Diagnosis not present

## 2014-08-14 DIAGNOSIS — G589 Mononeuropathy, unspecified: Secondary | ICD-10-CM | POA: Diagnosis not present

## 2014-08-14 DIAGNOSIS — G629 Polyneuropathy, unspecified: Secondary | ICD-10-CM | POA: Diagnosis not present

## 2014-08-14 DIAGNOSIS — E785 Hyperlipidemia, unspecified: Secondary | ICD-10-CM | POA: Diagnosis not present

## 2014-08-14 DIAGNOSIS — D649 Anemia, unspecified: Secondary | ICD-10-CM

## 2014-08-14 DIAGNOSIS — M48061 Spinal stenosis, lumbar region without neurogenic claudication: Secondary | ICD-10-CM

## 2014-08-14 NOTE — Progress Notes (Signed)
PATIENT: Susan Ford DOB: [birth year]  HISTORICAL  Susan Ford is a 81 year-old right-handed white female,She was a patient of Dr. Erling Cruz, last visit was May 2014   She had a history of lumbar decompression surgery, presenting with chronic low back pain, mild gait difficulty due to pain.  MRI of the lumbar spine 12/25/2010 shows diffuse spondylitic changes and progression of degenerative changes at L3-4 and L4-5 and modest broad disc bulge at L5-S1. There is evidence of neuroforaminal  stenosis on the left at L4-5. She has pain in her feet and aching from her knees down.  EMG/NCV 12/24/2011 showing progression of peripheral neuropathy versus a prior study 09/13/2011. There was also a  mild overlying acute L5 radiculopathy on the left. She underwent  surgical decompression at L4-5 and L5-S1 with bilateral foraminotomies and microdiscectomy on the left at L4-5 02/01/2012 by Dr. Ellene Route, Surgery helped her.  She still complains of pain at low back pain, waist line, going down her leg, at both leg.  She denies bowel/bladder incontinence   She has difficulty walking, because of low back pain, worst pain in the morning, she usually take her hydrocodone early morning, then napping for a while, then she can function again.  Previously she has tried Lyrica which caused abdominal cramps,  Cymbalta, caused nausea. She had side effects from nortriptyline 20 mg per night. She is on gabapentin 600, 1-1/2  tablets 4 times per day and complains of memory loss on the medication and now questions any improvement in pain. She uses a cane, but not in her house.   She underwent an epidural steroid injection 10/2011 with improvement in her back pain.   She is independent in activities of daily living including driving a car, cooking,cleaning, shopping, and doing laundry.     UPDATE April 20th 2016: Last epidural injection was in 2015, she still has a lot of low back pain, she was put on nortriptyline 25  mg qhs, neurontin 600mg  qid, hydrocodone prn, celebrex 200mg  daily.  We have reviewed MRI lumbar in Feb 2016: Multilevel degenerative disc disease, with mild spinal stenosis, facet arthropathy, mild to moderate foraminal stenosis at different level She complains of constant low back pian, bilatera legs achy pain, triggered by standing for just 10 minutes, she has to sit down resting after standing or walking for short period of time  She is taking Gabapentin 600 mg 4 times a day, Celexa 200 mg every day, recently started nortriptyline 25 mg every night hydrocodone as needed previously tried Cymbalta, without helping her symptoms, untolerable side effect  REVIEW OF SYSTEMS: Full 14 system review of systems performed and notable only for achy muscles, bilateral lower extremity deep achy pain  ALLERGIES: Allergies  Allergen Reactions  . Lyrica [Pregabalin] Other (See Comments)    "High Fever"   . Oxytetracycline Other (See Comments)    "High Fever" caused by terramycin    HOME MEDICATIONS: Current Outpatient Prescriptions  Medication Sig Dispense Refill  . ascorbic acid (VITAMIN C) 1000 MG tablet Take 1,000 mg by mouth daily after lunch.     Marland Kitchen aspirin 81 MG tablet Take 81 mg by mouth daily after lunch.     . calcium citrate-vitamin D (CITRACAL+D) 315-200 MG-UNIT per tablet Take 1 tablet by mouth 2 (two) times daily.     . celecoxib (CELEBREX) 200 MG capsule TAKE 1 CAPSULE (200 MG TOTAL) BY MOUTH DAILY AS NEEDED (ARTHRITIS PAIN). 30 capsule 3  . diltiazem (CARDIZEM CD)  120 MG 24 hr capsule TAKE ONE CAPSULE BY MOUTH EVERY DAY 30 capsule 11  . estradiol (VIVELLE-DOT) 0.025 MG/24HR Place 1 patch onto the skin once a week. On Saturday    . famotidine (PEPCID) 40 MG tablet Take 40 mg by mouth daily at 3 pm.     . gabapentin (NEURONTIN) 600 MG tablet TAKE 1 TABLET BY MOUTH 4 TIMES A DAY 360 tablet 2  . HYDROcodone-acetaminophen (NORCO) 7.5-325 MG per tablet Take 1 tablet by mouth every 6 (six)  hours as needed for moderate pain. 40 tablet 0  . Multiple Vitamin (MULTIVITAMIN) tablet Take 1 tablet by mouth daily after lunch.     . nortriptyline (PAMELOR) 25 MG capsule Take 25 mg by mouth at bedtime.    . simvastatin (ZOCOR) 20 MG tablet Take 1 tablet (20 mg total) by mouth at bedtime. 30 tablet 4   No current facility-administered medications for this visit.    PAST MEDICAL HISTORY: Past Medical History  Diagnosis Date  . GERD (gastroesophageal reflux disease)     Had endoscopy no history of Barrett's  . Arthritis   . Cataract 2007    bilateral cat ext   . Unspecified hereditary and idiopathic peripheral neuropathy   . Cholecystitis   . Dysrhythmia 02-21-2014    after cholecystectomy had atrial fib for a few hours, none since  . Complication of anesthesia Feb 21, 2014    atrial fib after cholecystectomy, lasted a few hours, none since    PAST SURGICAL HISTORY: Past Surgical History  Procedure Laterality Date  . Lumbar laminectomy/decompression microdiscectomy  02/01/2012    Procedure: LUMBAR LAMINECTOMY/DECOMPRESSION MICRODISCECTOMY 2 LEVELS;  Surgeon: Kristeen Miss, MD;  Location: [place] [facility];  Service: Neurosurgery;  Laterality: Left;  Left Lumbar Four-Five Lumbar Five-Sacral One Laminectomies/Foraminotomies/Microscope  . Cholecystectomy N/A 02/21/2014    Procedure: LAPAROSCOPIC CHOLECYSTECTOMY WITH INTRAOPERATIVE CHOLANGIOGRAM;  Surgeon: Doreen Salvage, MD;  Location: [place];  Service: General;  Laterality: N/A;  . Ercp N/A 02/24/2014    Procedure: ENDOSCOPIC RETROGRADE CHOLANGIOPANCREATOGRAPHY (ERCP);  Surgeon: Arta Silence, MD;  Location: [facility];  Service: Endoscopy;  Laterality: N/A;  . Eye surgery Bilateral 2007    both eyes cataracts with lens replacments  . Tonsillectomy  age 28  . Abdominal hysterectomy  15 yrs ago    complete  . Endoscopic retrograde cholangiopancreatography (ercp) with propofol N/A 05/29/2014    Procedure: ENDOSCOPIC RETROGRADE  CHOLANGIOPANCREATOGRAPHY (ERCP) WITH PROPOFOL;  Surgeon: Arta Silence, MD;  Location: [facility];  Service: Endoscopy;  Laterality: N/A;  . Spyglass cholangioscopy N/A 05/29/2014    Procedure: UUVOZDGU CHOLANGIOSCOPY;  Surgeon: Arta Silence, MD;  Location: [facility];  Service: Endoscopy;  Laterality: N/A;    FAMILY HISTORY: Family History  Problem Relation Age of Onset  . Neurodegenerative disease Mother     Mikey Bussing Drager died at 63  . Alcohol abuse    . Heart disease      SOCIAL HISTORY:  History   Social History  . Marital Status: Widowed    Spouse Name: Gwyndolyn Saxon  . Number of Children: N/A  . Years of Education: N/A   Occupational History  . Retired     Therapist, sports   Social History Main Topics  . Smoking status: Former Smoker -- 0.25 packs/day for 7 years    Types: Cigarettes    Quit date: 04/27/1963  . Smokeless tobacco: Never Used  . Alcohol Use: 4.2 oz/week    7 Glasses of wine per week  Comment: 1 glass of wine daily  . Drug Use: No  . Sexual Activity: Yes   Other Topics Concern  . Not on file   Social History Narrative   Retired Therapist, sports    widowed since    Spring  15    [facility] of [facility]    Regular exercise former smoker   G3P3   From [place] originally            PHYSICAL EXAM   Filed Vitals:   08/14/14 1050  BP: 129/72  Pulse: 92  Height: 5\' 4"  (1.626 m)  Weight: 136 lb (61.689 kg)    Not recorded      Body mass index is 23.33 kg/(m^2).  PHYSICAL EXAMNIATION:  Gen: NAD, conversant, well nourised, obese, well groomed                     Cardiovascular: Regular rate rhythm, no peripheral edema, warm, nontender. Eyes: Conjunctivae clear without exudates or hemorrhage Neck: Supple, no carotid bruise. Pulmonary: Clear to auscultation bilaterally   NEUROLOGICAL EXAM:  MENTAL STATUS: Speech:    Speech is normal; fluent and spontaneous with normal comprehension.  Cognition:    The patient is oriented to person, place, and time;     recent and  remote memory intact;     language fluent;     normal attention, concentration,     fund of knowledge.  CRANIAL NERVES: CN II: Visual fields are full to confrontation. Fundoscopic exam is normal with sharp discs and no vascular changes. Venous pulsations are present bilaterally. Pupils are 4 mm and briskly reactive to light. Visual acuity is 20/20 bilaterally. CN III, IV, VI: extraocular movement are normal. No ptosis. CN V: Facial sensation is intact to pinprick in all 3 divisions bilaterally. Corneal responses are intact.  CN VII: Face is symmetric with normal eye closure and smile. CN VIII: Hearing is normal to rubbing fingers CN IX, X: Palate elevates symmetrically. Phonation is normal. CN XI: Head turning and shoulder shrug are intact CN XII: Tongue is midline with normal movements and no atrophy.  MOTOR: There is no pronator drift of out-stretched arms. Muscle bulk and tone are normal. Muscle strength is normal.   Shoulder abduction Shoulder external rotation Elbow flexion Elbow extension Wrist flexion Wrist extension Finger abduction Hip flexion Knee flexion Knee extension Ankle dorsi flexion Ankle plantar flexion  R 5 5 5 5 5 5 5 5 5 5 5 5   L 5 5 5 5 5 5 5 5 5 5 5 5     REFLEXES: Reflexes are 2+ and symmetric at the biceps, triceps, knees, and ankles. Plantar responses are flexor.  SENSORY: Light touch, pinprick, position sense, and vibration sense are intact in fingers and toes.  COORDINATION: Rapid alternating movements and fine finger movements are intact. There is no dysmetria on finger-to-nose and heel-knee-shin. There are no abnormal or extraneous movements.   GAIT/STANCE: Mild antalgic, cautious Romberg is absent.   DIAGNOSTIC DATA (LABS, IMAGING, TESTING) - I reviewed patient records, labs, notes, testing and imaging myself where available.  Lab Results  Component Value Date   WBC 8.3 02/26/2014   HGB 11.7* 02/26/2014   HCT 34.2* 02/26/2014   MCV 88.4  02/26/2014   PLT 286 02/26/2014      Component Value Date/Time   NA 137 02/26/2014 0508   K 3.6* 02/26/2014 0508   CL 101 02/26/2014 0508   CO2 25 02/26/2014 0508   GLUCOSE 103* 02/26/2014 1941  BUN 11 02/26/2014 0508   CREATININE 0.54 02/26/2014 0508   CALCIUM 8.3* 02/26/2014 0508   PROT 5.8* 02/26/2014 0508   ALBUMIN 2.5* 02/26/2014 0508   AST 20 02/26/2014 0508   ALT 59* 02/26/2014 0508   ALKPHOS 110 02/26/2014 0508   BILITOT 0.4 02/26/2014 0508   GFRNONAA 86* 02/26/2014 0508   GFRAA >90 02/26/2014 0508   Lab Results  Component Value Date   CHOL 179 10/23/2013   HDL 70.10 10/23/2013   LDLCALC 68 10/23/2013   LDLDIRECT 153 02/12/2009   TRIG 203.0* 10/23/2013   CHOLHDL 3 10/23/2013   No results found for: HGBA1C No results found for: VITAMINB12 Lab Results  Component Value Date   TSH 0.76 10/23/2013      ASSESSMENT AND PLAN  ICESS BERTONI is a 81 y.o. 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 Return to clinic in 3 months      Marcial Pacas, M.D. Ph.D.  [facility] [address], [place], [place] [address] Ph: [phone] Fax: [phone]

## 2014-08-18 LAB — IRON AND TIBC
Iron Saturation: 47 % (ref 15–55)
Iron: 133 ug/dL (ref 27–139)
Total Iron Binding Capacity: 285 ug/dL (ref 250–450)
UIBC: 152 ug/dL (ref 118–369)

## 2014-08-18 LAB — VITAMIN B12: Vitamin B-12: 541 pg/mL (ref 211–946)

## 2014-08-18 LAB — FERRITIN: Ferritin: 103 ng/mL (ref 15–150)

## 2014-08-18 LAB — VITAMIN D 1,25 DIHYDROXY
Vitamin D 1, 25 (OH)2 Total: 22 pg/mL
Vitamin D2 1, 25 (OH)2: <10 pg/mL
Vitamin D3 1, 25 (OH)2: 22 pg/mL

## 2014-09-09 ENCOUNTER — Telehealth: Payer: Self-pay | Admitting: Internal Medicine

## 2014-09-09 NOTE — Telephone Encounter (Signed)
Error/njr °

## 2014-09-10 DIAGNOSIS — G609 Hereditary and idiopathic neuropathy, unspecified: Secondary | ICD-10-CM | POA: Diagnosis not present

## 2014-09-10 DIAGNOSIS — M545 Low back pain: Secondary | ICD-10-CM | POA: Diagnosis not present

## 2014-09-10 DIAGNOSIS — M47816 Spondylosis without myelopathy or radiculopathy, lumbar region: Secondary | ICD-10-CM | POA: Diagnosis not present

## 2014-10-08 ENCOUNTER — Other Ambulatory Visit: Payer: Self-pay | Admitting: Internal Medicine

## 2014-10-08 DIAGNOSIS — M545 Low back pain: Secondary | ICD-10-CM | POA: Diagnosis not present

## 2014-10-08 DIAGNOSIS — M79606 Pain in leg, unspecified: Secondary | ICD-10-CM | POA: Diagnosis not present

## 2014-10-08 DIAGNOSIS — M47816 Spondylosis without myelopathy or radiculopathy, lumbar region: Secondary | ICD-10-CM | POA: Diagnosis not present

## 2014-10-08 DIAGNOSIS — G609 Hereditary and idiopathic neuropathy, unspecified: Secondary | ICD-10-CM | POA: Diagnosis not present

## 2014-10-08 NOTE — Telephone Encounter (Signed)
Sent to the pharmacy by e-scribe. 

## 2014-10-15 DIAGNOSIS — M961 Postlaminectomy syndrome, not elsewhere classified: Secondary | ICD-10-CM | POA: Diagnosis not present

## 2014-10-15 DIAGNOSIS — M545 Low back pain: Secondary | ICD-10-CM | POA: Diagnosis not present

## 2014-11-14 DIAGNOSIS — M545 Low back pain: Secondary | ICD-10-CM | POA: Diagnosis not present

## 2014-11-14 DIAGNOSIS — M961 Postlaminectomy syndrome, not elsewhere classified: Secondary | ICD-10-CM | POA: Diagnosis not present

## 2014-11-14 DIAGNOSIS — G609 Hereditary and idiopathic neuropathy, unspecified: Secondary | ICD-10-CM | POA: Diagnosis not present

## 2014-12-10 ENCOUNTER — Other Ambulatory Visit: Payer: Self-pay | Admitting: Neurology

## 2014-12-12 DIAGNOSIS — M961 Postlaminectomy syndrome, not elsewhere classified: Secondary | ICD-10-CM | POA: Diagnosis not present

## 2014-12-12 DIAGNOSIS — M545 Low back pain: Secondary | ICD-10-CM | POA: Diagnosis not present

## 2014-12-20 DIAGNOSIS — Z85828 Personal history of other malignant neoplasm of skin: Secondary | ICD-10-CM | POA: Diagnosis not present

## 2014-12-20 DIAGNOSIS — D2272 Melanocytic nevi of left lower limb, including hip: Secondary | ICD-10-CM | POA: Diagnosis not present

## 2014-12-20 DIAGNOSIS — C44519 Basal cell carcinoma of skin of other part of trunk: Secondary | ICD-10-CM | POA: Diagnosis not present

## 2014-12-20 DIAGNOSIS — D1801 Hemangioma of skin and subcutaneous tissue: Secondary | ICD-10-CM | POA: Diagnosis not present

## 2014-12-20 DIAGNOSIS — L570 Actinic keratosis: Secondary | ICD-10-CM | POA: Diagnosis not present

## 2014-12-20 DIAGNOSIS — L821 Other seborrheic keratosis: Secondary | ICD-10-CM | POA: Diagnosis not present

## 2014-12-20 DIAGNOSIS — D2271 Melanocytic nevi of right lower limb, including hip: Secondary | ICD-10-CM | POA: Diagnosis not present

## 2015-01-08 ENCOUNTER — Ambulatory Visit: Payer: Medicare Other | Admitting: Neurology

## 2015-01-14 ENCOUNTER — Ambulatory Visit (INDEPENDENT_AMBULATORY_CARE_PROVIDER_SITE_OTHER): Payer: Medicare Other | Admitting: Neurology

## 2015-01-14 ENCOUNTER — Encounter: Payer: Self-pay | Admitting: Neurology

## 2015-01-14 ENCOUNTER — Other Ambulatory Visit: Payer: Self-pay | Admitting: Internal Medicine

## 2015-01-14 VITALS — BP 129/71 | HR 99 | Ht 64.0 in | Wt 138.0 lb

## 2015-01-14 DIAGNOSIS — R269 Unspecified abnormalities of gait and mobility: Secondary | ICD-10-CM | POA: Diagnosis not present

## 2015-01-14 DIAGNOSIS — G629 Polyneuropathy, unspecified: Secondary | ICD-10-CM | POA: Diagnosis not present

## 2015-01-14 DIAGNOSIS — M4806 Spinal stenosis, lumbar region: Secondary | ICD-10-CM | POA: Diagnosis not present

## 2015-01-14 DIAGNOSIS — M48061 Spinal stenosis, lumbar region without neurogenic claudication: Secondary | ICD-10-CM

## 2015-01-14 NOTE — Progress Notes (Signed)
Chief Complaint  Patient presents with  . Peripheral Neuropathy    Says her bilateral feet feel stiff, especially at night.  . Back Pain    Reports back pain is tolerable with limited physical activities and frequent rest breaks.      PATIENT: Susan Ford DOB: [birth year]  HISTORICAL  Susan Ford is a 81 year-old right-handed white female,She was a patient of Dr. Erling Cruz, last visit was May 2014   She had a history of lumbar decompression surgery, presenting with chronic low back pain, mild gait difficulty due to pain.  MRI of the lumbar spine 12/25/2010 shows diffuse spondylitic changes and progression of degenerative changes at L3-4 and L4-5 and modest broad disc bulge at L5-S1. There is evidence of neuroforaminal  stenosis on the left at L4-5. She has pain in her feet and aching from her knees down.  EMG/NCV 12/24/2011 showing progression of peripheral neuropathy versus a prior study 09/13/2011. There was also a  mild overlying acute L5 radiculopathy on the left. She underwent  surgical decompression at L4-5 and L5-S1 with bilateral foraminotomies and microdiscectomy on the left at L4-5 02/01/2012 by Dr. Ellene Route, Surgery helped her.  She still complains of pain at low back pain, waist line, going down her leg, at both leg.  She denies bowel/bladder incontinence   She has difficulty walking, because of low back pain, worst pain in the morning, she usually take her hydrocodone early morning, then napping for a while, then she can function again.  Previously she has tried Lyrica which caused abdominal cramps,  Cymbalta, caused nausea. She had side effects from nortriptyline 20 mg per night. She is on gabapentin 600, 1-1/2  tablets 4 times per day and complains of memory loss on the medication and now questions any improvement in pain. She uses a cane, but not in her house.   She underwent an epidural steroid injection 10/2011 with improvement in her back pain.   She is independent  in activities of daily living including driving a car, cooking,cleaning, shopping, and doing laundry.     UPDATE April 20th 2016: Last epidural injection was in 2015, she still has a lot of low back pain, she was put on nortriptyline 25 mg qhs, neurontin 600mg  qid, hydrocodone prn, celebrex 200mg  daily.  We have reviewed MRI lumbar in Feb 2016: Multilevel degenerative disc disease, with mild spinal stenosis, facet arthropathy, mild to moderate foraminal stenosis at different level She complains of constant low back pian, bilatera legs achy pain, triggered by standing for just 10 minutes, she has to sit down resting after standing or walking for short period of time  She is taking Gabapentin 600 mg 4 times a day, Celexa 200 mg every day, recently started nortriptyline 25 mg every night hydrocodone as needed previously tried Cymbalta, without helping her symptoms, untolerable side effect  UPDATE Jan 14 2015: She is alone at visit, she still takes celebrex 200mg  bid, Neurontin 600 mg 4 times a day, she could stand for 10 mintes, then has to sit down because of her low back pain, she also complains of intermittent bilateral lower extremity deep achy pain,  She rarely needs narcotics now, denies bowel and bladder incontinence, no bilateral lower extremity numbness, no significant weakness.    REVIEW OF SYSTEMS: Full 14 system review of systems performed and notable only for achy muscles, bilateral lower extremity deep achy pain  ALLERGIES: Allergies  Allergen Reactions  . Lyrica [Pregabalin] Other (See Comments)    "  High Fever"   . Oxytetracycline Other (See Comments)    "High Fever" caused by terramycin    HOME MEDICATIONS: Current Outpatient Prescriptions  Medication Sig Dispense Refill  . ascorbic acid (VITAMIN C) 1000 MG tablet Take 1,000 mg by mouth daily after lunch.     Marland Kitchen aspirin 81 MG tablet Take 81 mg by mouth daily after lunch.     . calcium citrate-vitamin D (CITRACAL+D) 315-200  MG-UNIT per tablet Take 1 tablet by mouth 2 (two) times daily.     . celecoxib (CELEBREX) 200 MG capsule TAKE 1 CAPSULE (200 MG TOTAL) BY MOUTH DAILY AS NEEDED (ARTHRITIS PAIN). (Patient taking differently: Taking one capsule twice daily.) 30 capsule 3  . diltiazem (CARDIZEM CD) 120 MG 24 hr capsule TAKE ONE CAPSULE BY MOUTH EVERY DAY 30 capsule 11  . estradiol (VIVELLE-DOT) 0.025 MG/24HR Place 1 patch onto the skin once a week. On Saturday    . famotidine (PEPCID) 40 MG tablet Take 40 mg by mouth daily at 3 pm.     . gabapentin (NEURONTIN) 600 MG tablet TAKE 1 TABLET BY MOUTH 4 TIMES A DAY 360 tablet 0  . HYDROcodone-acetaminophen (NORCO) 7.5-325 MG per tablet Take 1 tablet by mouth every 6 (six) hours as needed for moderate pain. 40 tablet 0  . Multiple Vitamin (MULTIVITAMIN) tablet Take 1 tablet by mouth daily after lunch.     . simvastatin (ZOCOR) 20 MG tablet Take 1 tablet (20 mg total) by mouth at bedtime. 30 tablet 4   No current facility-administered medications for this visit.    PAST MEDICAL HISTORY: Past Medical History  Diagnosis Date  . GERD (gastroesophageal reflux disease)     Had endoscopy no history of Barrett's  . Arthritis   . Cataract 2007    bilateral cat ext   . Unspecified hereditary and idiopathic peripheral neuropathy   . Cholecystitis   . Dysrhythmia 02-21-2014    after cholecystectomy had atrial fib for a few hours, none since  . Complication of anesthesia Feb 21, 2014    atrial fib after cholecystectomy, lasted a few hours, none since    PAST SURGICAL HISTORY: Past Surgical History  Procedure Laterality Date  . Lumbar laminectomy/decompression microdiscectomy  02/01/2012    Procedure: LUMBAR LAMINECTOMY/DECOMPRESSION MICRODISCECTOMY 2 LEVELS;  Surgeon: Kristeen Miss, MD;  Location: [place] [facility];  Service: Neurosurgery;  Laterality: Left;  Left Lumbar Four-Five Lumbar Five-Sacral One Laminectomies/Foraminotomies/Microscope  . Cholecystectomy N/A 02/21/2014      Procedure: LAPAROSCOPIC CHOLECYSTECTOMY WITH INTRAOPERATIVE CHOLANGIOGRAM;  Surgeon: Doreen Salvage, MD;  Location: [facility];  Service: General;  Laterality: N/A;  . Ercp N/A 02/24/2014    Procedure: ENDOSCOPIC RETROGRADE CHOLANGIOPANCREATOGRAPHY (ERCP);  Surgeon: Arta Silence, MD;  Location: [facility];  Service: Endoscopy;  Laterality: N/A;  . Eye surgery Bilateral 2007    both eyes cataracts with lens replacments  . Tonsillectomy  age 16  . Abdominal hysterectomy  15 yrs ago    complete  . Endoscopic retrograde cholangiopancreatography (ercp) with propofol N/A 05/29/2014    Procedure: ENDOSCOPIC RETROGRADE CHOLANGIOPANCREATOGRAPHY (ERCP) WITH PROPOFOL;  Surgeon: Arta Silence, MD;  Location: [facility];  Service: Endoscopy;  Laterality: N/A;  . Spyglass cholangioscopy N/A 05/29/2014    Procedure: [id number] CHOLANGIOSCOPY;  Surgeon: Arta Silence, MD;  Location: [facility];  Service: Endoscopy;  Laterality: N/A;    FAMILY HISTORY: Family History  Problem Relation Age of Onset  . Neurodegenerative disease Mother     Mikey Bussing Drager died at 61  .  Alcohol abuse    . Heart disease      SOCIAL HISTORY:  Social History   Social History  . Marital Status: Widowed    Spouse Name: Gwyndolyn Saxon  . Number of Children: N/A  . Years of Education: N/A   Occupational History  . Retired     Therapist, sports   Social History Main Topics  . Smoking status: Former Smoker -- 0.25 packs/day for 7 years    Types: Cigarettes    Quit date: 04/27/1963  . Smokeless tobacco: Never Used  . Alcohol Use: 4.2 oz/week    7 Glasses of wine per week     Comment: 1 glass of wine daily  . Drug Use: No  . Sexual Activity: Yes   Other Topics Concern  . Not on file   Social History Narrative   Retired Therapist, sports    widowed since    Spring  15    [facility] of [facility]    Regular exercise former smoker   G3P3   From [place] originally            PHYSICAL EXAM   Filed Vitals:   01/14/15 1417  BP: 129/71  Pulse: 99  Height: 5\' 4"   (1.626 m)  Weight: 138 lb (62.596 kg)    Not recorded      Body mass index is 23.68 kg/(m^2).  PHYSICAL EXAMNIATION:  Gen: NAD, conversant, well nourised, obese, well groomed                     Cardiovascular: Regular rate rhythm, no peripheral edema, warm, nontender. Eyes: Conjunctivae clear without exudates or hemorrhage Neck: Supple, no carotid bruise. Pulmonary: Clear to auscultation bilaterally   NEUROLOGICAL EXAM:  MENTAL STATUS: Speech:    Speech is normal; fluent and spontaneous with normal comprehension.  Cognition:    The patient is oriented to person, place, and time;     recent and remote memory intact;     language fluent;     normal attention, concentration,     fund of knowledge.  CRANIAL NERVES: CN II: Visual fields are full to confrontation. Pupil equal round reactive to light CN III, IV, VI: extraocular movement are normal. No ptosis. CN V: Facial sensation is intact to pinprick in all 3 divisions bilaterally. Corneal responses are intact.  CN VII: Face is symmetric with normal eye closure and smile. CN VIII: Hearing is normal to rubbing fingers CN IX, X: Palate elevates symmetrically. Phonation is normal. CN XI: Head turning and shoulder shrug are intact CN XII: Tongue is midline with normal movements and no atrophy.  MOTOR: There is no pronator drift of out-stretched arms. Muscle bulk and tone are normal. Muscle strength is normal.   Shoulder abduction Shoulder external rotation Elbow flexion Elbow extension Wrist flexion Wrist extension Finger abduction Hip flexion Knee flexion Knee extension Ankle dorsi flexion Ankle plantar flexion  R 5 5 5 5 5 5 5 5 5 5 5 5   L 5 5 5 5 5 5 5 5 5 5 5 5     REFLEXES: Reflexes are 2+ and symmetric at the biceps, triceps, knees, and ankles. Plantar responses are flexor.  SENSORY: Light touch, pinprick, position sense, and vibration sense are intact in fingers and toes.  COORDINATION: Rapid alternating movements  and fine finger movements are intact. There is no dysmetria on finger-to-nose and heel-knee-shin.   GAIT/STANCE: Mild antalgic, cautious, scoliosis Romberg is absent.   DIAGNOSTIC DATA (LABS, IMAGING, TESTING) - I  reviewed patient records, labs, notes, testing and imaging myself where available.  Lab Results  Component Value Date   WBC 8.3 02/26/2014   HGB 11.7* 02/26/2014   HCT 34.2* 02/26/2014   MCV 88.4 02/26/2014   PLT 286 02/26/2014      Component Value Date/Time   NA 137 02/26/2014 0508   K 3.6* 02/26/2014 0508   CL 101 02/26/2014 0508   CO2 25 02/26/2014 0508   GLUCOSE 103* 02/26/2014 0508   BUN 11 02/26/2014 0508   CREATININE 0.54 02/26/2014 0508   CALCIUM 8.3* 02/26/2014 0508   PROT 5.8* 02/26/2014 0508   ALBUMIN 2.5* 02/26/2014 0508   AST 20 02/26/2014 0508   ALT 59* 02/26/2014 0508   ALKPHOS 110 02/26/2014 0508   BILITOT 0.4 02/26/2014 0508   GFRNONAA 86* 02/26/2014 0508   GFRAA >90 02/26/2014 0508   Lab Results  Component Value Date   CHOL 179 10/23/2013   HDL 70.10 10/23/2013   LDLCALC 68 10/23/2013   LDLDIRECT 153 02/12/2009   TRIG 203.0* 10/23/2013   CHOLHDL 3 10/23/2013   No results found for: HGBA1C Lab Results  Component Value Date   [id number] 941 08/14/2014   Lab Results  Component Value Date   TSH 0.76 10/23/2013      ASSESSMENT AND PLAN  SARAIH LORTON is a 81 y.o. female  with past medical history of lumbar degenerative disc disease, status post decompression, now with persistent low back pain, bilateral lower extremity deep achy pain, on polypharmacy treatment,Celebrex 200 mg a day, hydrocodone as needed, gabapentin 600 mg 4 times   Chronic low back pain, lumbar radiculopathy  Continue gabapentin 600 mg 4 times a day  Celebrex 200 mg twice a day  Return to clinic as needed   Marcial Pacas, M.D. Ph.D.  [facility] [address] [place], [place] [address] Ph: [phone] Fax: [phone]

## 2015-01-14 NOTE — Telephone Encounter (Signed)
Sent to the pharmacy by e-scribe. 

## 2015-01-15 DIAGNOSIS — Z23 Encounter for immunization: Secondary | ICD-10-CM | POA: Diagnosis not present

## 2015-01-29 ENCOUNTER — Ambulatory Visit (INDEPENDENT_AMBULATORY_CARE_PROVIDER_SITE_OTHER): Payer: Medicare Other | Admitting: Internal Medicine

## 2015-01-29 ENCOUNTER — Encounter: Payer: Self-pay | Admitting: Internal Medicine

## 2015-01-29 VITALS — BP 134/76 | Temp 98.4°F | Ht 61.5 in | Wt 138.2 lb

## 2015-01-29 DIAGNOSIS — Z79899 Other long term (current) drug therapy: Secondary | ICD-10-CM | POA: Diagnosis not present

## 2015-01-29 DIAGNOSIS — E785 Hyperlipidemia, unspecified: Secondary | ICD-10-CM

## 2015-01-29 DIAGNOSIS — D509 Iron deficiency anemia, unspecified: Secondary | ICD-10-CM | POA: Diagnosis not present

## 2015-01-29 DIAGNOSIS — Z0000 Encounter for general adult medical examination without abnormal findings: Secondary | ICD-10-CM | POA: Diagnosis not present

## 2015-01-29 DIAGNOSIS — M48061 Spinal stenosis, lumbar region without neurogenic claudication: Secondary | ICD-10-CM

## 2015-01-29 DIAGNOSIS — G629 Polyneuropathy, unspecified: Secondary | ICD-10-CM | POA: Diagnosis not present

## 2015-01-29 DIAGNOSIS — M4806 Spinal stenosis, lumbar region: Secondary | ICD-10-CM

## 2015-01-29 DIAGNOSIS — Z13820 Encounter for screening for osteoporosis: Secondary | ICD-10-CM

## 2015-01-29 DIAGNOSIS — E2839 Other primary ovarian failure: Secondary | ICD-10-CM

## 2015-01-29 DIAGNOSIS — Z8679 Personal history of other diseases of the circulatory system: Secondary | ICD-10-CM

## 2015-01-29 LAB — HEPATIC FUNCTION PANEL
ALT: 11 U/L (ref 0–35)
AST: 16 U/L (ref 0–37)
Albumin: 4.1 g/dL (ref 3.5–5.2)
Alkaline Phosphatase: 54 U/L (ref 39–117)
Bilirubin, Direct: 0.1 mg/dL (ref 0.0–0.3)
Total Bilirubin: 0.6 mg/dL (ref 0.2–1.2)
Total Protein: 6.3 g/dL (ref 6.0–8.3)

## 2015-01-29 LAB — LIPID PANEL
Cholesterol: 159 mg/dL (ref 0–200)
HDL: 67.9 mg/dL (ref >39.00)
LDL Cholesterol: 70 mg/dL (ref 0–99)
NonHDL: 91.06
Total CHOL/HDL Ratio: 2
Triglycerides: 105 mg/dL (ref 0.0–149.0)
VLDL: 21 mg/dL (ref 0.0–40.0)

## 2015-01-29 LAB — BASIC METABOLIC PANEL
BUN: 11 mg/dL (ref 6–23)
CO2: 28 mEq/L (ref 19–32)
Calcium: 9.5 mg/dL (ref 8.4–10.5)
Chloride: 101 mEq/L (ref 96–112)
Creatinine, Ser: 0.68 mg/dL (ref 0.40–1.20)
GFR: 87.7 mL/min (ref >60.00)
Glucose, Bld: 95 mg/dL (ref 70–99)
Potassium: 5.2 mEq/L — ABNORMAL HIGH (ref 3.5–5.1)
Sodium: 136 mEq/L (ref 135–145)

## 2015-01-29 LAB — CBC WITH DIFFERENTIAL/PLATELET
Basophils Absolute: 0.1 10*3/uL (ref 0.0–0.1)
Basophils Relative: 1.2 % (ref 0.0–3.0)
Eosinophils Absolute: 0.5 10*3/uL (ref 0.0–0.7)
Eosinophils Relative: 6.4 % — ABNORMAL HIGH (ref 0.0–5.0)
HCT: 39.7 % (ref 36.0–46.0)
Hemoglobin: 12.9 g/dL (ref 12.0–15.0)
Lymphocytes Relative: 27 % (ref 12.0–46.0)
Lymphs Abs: 2 10*3/uL (ref 0.7–4.0)
MCHC: 32.5 g/dL (ref 30.0–36.0)
MCV: 94.9 fl (ref 78.0–100.0)
Monocytes Absolute: 0.5 10*3/uL (ref 0.1–1.0)
Monocytes Relative: 6.9 % (ref 3.0–12.0)
Neutro Abs: 4.4 10*3/uL (ref 1.4–7.7)
Neutrophils Relative %: 58.5 % (ref 43.0–77.0)
Platelets: 274 10*3/uL (ref 150.0–400.0)
RBC: 4.18 Mil/uL (ref 3.87–5.11)
RDW: 12.9 % (ref 11.5–15.5)
WBC: 7.5 10*3/uL (ref 4.0–10.5)

## 2015-01-29 LAB — TSH: TSH: 1.04 u[IU]/mL (ref 0.35–4.50)

## 2015-01-29 MED ORDER — CELECOXIB 200 MG PO CAPS: ORAL_CAPSULE | ORAL | Status: DC

## 2015-01-29 NOTE — Patient Instructions (Addendum)
Will notify you  of labs when available.  Advise every 6 months lab monitoring for kidney function because of taking daily celebrex   Get dexa scan appt  in the next year to look for fracture risk   yearly flu vaccine   Attention to fall prevention  It appears that you may have  had the prevnar 13 (Baby pneumococcal vaccine) x 2   Assume you have pneumovax at age 81  Confirm with [facility] what you had there.    Adult vaccines due  Topic Date Due  . TETANUS/TDAP  11/07/2021  . ZOSTAVAX  Completed   'ROV in 6 months  Bmp pre visit  Or at visit   Health Maintenance, Female Adopting a healthy lifestyle and getting preventive care can go a long way to promote health and wellness. Talk with your health care provider about what schedule of regular examinations is right for you. This is a good chance for you to check in with your provider about disease prevention and staying healthy. In between checkups, there are plenty of things you can do on your own. Experts have done a lot of research about which lifestyle changes and preventive measures are most likely to keep you healthy. Ask your health care provider for more information. WEIGHT AND DIET  Eat a healthy diet  Be sure to include plenty of vegetables, fruits, low-fat dairy products, and lean protein.  Do not eat a lot of foods high in solid fats, added sugars, or salt.  Get regular exercise. This is one of the most important things you can do for your health.  Most adults should exercise for at least 150 minutes each week. The exercise should increase your heart rate and make you sweat (moderate-intensity exercise).  Most adults should also do strengthening exercises at least twice a week. This is in addition to the moderate-intensity exercise.  Maintain a healthy weight  Body mass index (BMI) is a measurement that can be used to identify possible weight problems. It estimates body fat based on height and weight. Your health care  provider can help determine your BMI and help you achieve or maintain a healthy weight.  For females 72 years of age and older:   A BMI below 18.5 is considered underweight.  A BMI of 18.5 to 24.9 is normal.  A BMI of 25 to 29.9 is considered overweight.  A BMI of 30 and above is considered obese.  Watch levels of cholesterol and blood lipids  You should start having your blood tested for lipids and cholesterol at 79 years of age, then have this test every 5 years.  You may need to have your cholesterol levels checked more often if:  Your lipid or cholesterol levels are high.  You are older than 79 years of age.  You are at high risk for heart disease.  CANCER SCREENING   Lung Cancer  Lung cancer screening is recommended for adults 31-22 years old who are at high risk for lung cancer because of a history of smoking.  A yearly low-dose CT scan of the lungs is recommended for people who:  Currently smoke.  Have quit within the past 15 years.  Have at least a 30-pack-year history of smoking. A pack year is smoking an average of one pack of cigarettes a day for 1 year.  Yearly screening should continue until it has been 15 years since you quit.  Yearly screening should stop if you develop a health problem that  would prevent you from having lung cancer treatment.  Breast Cancer  Practice breast self-awareness. This means understanding how your breasts normally appear and feel.  It also means doing regular breast self-exams. Let your health care provider know about any changes, no matter how small.  If you are in your 20s or 30s, you should have a clinical breast exam (CBE) by a health care provider every 1-3 years as part of a regular health exam.  If you are 80 or older, have a CBE every year. Also consider having a breast X-ray (mammogram) every year.  If you have a family history of breast cancer, talk to your health care provider about genetic screening.  If you  are at high risk for breast cancer, talk to your health care provider about having an MRI and a mammogram every year.  Breast cancer gene (BRCA) assessment is recommended for women who have family members with BRCA-related cancers. BRCA-related cancers include:  Breast.  Ovarian.  Tubal.  Peritoneal cancers.  Results of the assessment will determine the need for genetic counseling and BRCA1 and BRCA2 testing. Cervical Cancer Your health care provider may recommend that you be screened regularly for cancer of the pelvic organs (ovaries, uterus, and vagina). This screening involves a pelvic examination, including checking for microscopic changes to the surface of your cervix (Pap test). You may be encouraged to have this screening done every 3 years, beginning at age 81.  For women ages 80-65, health care providers may recommend pelvic exams and Pap testing every 3 years, or they may recommend the Pap and pelvic exam, combined with testing for human papilloma virus (HPV), every 5 years. Some types of HPV increase your risk of cervical cancer. Testing for HPV may also be done on women of any age with unclear Pap test results.  Other health care providers may not recommend any screening for nonpregnant women who are considered low risk for pelvic cancer and who do not have symptoms. Ask your health care provider if a screening pelvic exam is right for you.  If you have had past treatment for cervical cancer or a condition that could lead to cancer, you need Pap tests and screening for cancer for at least 20 years after your treatment. If Pap tests have been discontinued, your risk factors (such as having a new sexual partner) need to be reassessed to determine if screening should resume. Some women have medical problems that increase the chance of getting cervical cancer. In these cases, your health care provider may recommend more frequent screening and Pap tests. Colorectal Cancer  This type of  cancer can be detected and often prevented.  Routine colorectal cancer screening usually begins at 79 years of age and continues through 79 years of age.  Your health care provider may recommend screening at an earlier age if you have risk factors for colon cancer.  Your health care provider may also recommend using home test kits to check for hidden blood in the stool.  A small camera at the end of a tube can be used to examine your colon directly (sigmoidoscopy or colonoscopy). This is done to check for the earliest forms of colorectal cancer.  Routine screening usually begins at age 66.  Direct examination of the colon should be repeated every 5-10 years through 79 years of age. However, you may need to be screened more often if early forms of precancerous polyps or small growths are found. Skin Cancer  Check your skin from  head to toe regularly.  Tell your health care provider about any new moles or changes in moles, especially if there is a change in a mole's shape or color.  Also tell your health care provider if you have a mole that is larger than the size of a pencil eraser.  Always use sunscreen. Apply sunscreen liberally and repeatedly throughout the day.  Protect yourself by wearing long sleeves, pants, a wide-brimmed hat, and sunglasses whenever you are outside. HEART DISEASE, DIABETES, AND HIGH BLOOD PRESSURE   High blood pressure causes heart disease and increases the risk of stroke. High blood pressure is more likely to develop in:  People who have blood pressure in the high end of the normal range (130-139/85-89 mm Hg).  People who are overweight or obese.  People who are African American.  If you are 60-72 years of age, have your blood pressure checked every 3-5 years. If you are 44 years of age or older, have your blood pressure checked every year. You should have your blood pressure measured twice--once when you are at a hospital or clinic, and once when you are  not at a hospital or clinic. Record the average of the two measurements. To check your blood pressure when you are not at a hospital or clinic, you can use:  An automated blood pressure machine at a pharmacy.  A home blood pressure monitor.  If you are between 44 years and 82 years old, ask your health care provider if you should take aspirin to prevent strokes.  Have regular diabetes screenings. This involves taking a blood sample to check your fasting blood sugar level.  If you are at a normal weight and have a low risk for diabetes, have this test once every three years after 79 years of age.  If you are overweight and have a high risk for diabetes, consider being tested at a younger age or more often. PREVENTING INFECTION  Hepatitis B  If you have a higher risk for hepatitis B, you should be screened for this virus. You are considered at high risk for hepatitis B if:  You were born in a country where hepatitis B is common. Ask your health care provider which countries are considered high risk.  Your parents were born in a high-risk country, and you have not been immunized against hepatitis B (hepatitis B vaccine).  You have HIV or AIDS.  You use needles to inject street drugs.  You live with someone who has hepatitis B.  You have had sex with someone who has hepatitis B.  You get hemodialysis treatment.  You take certain medicines for conditions, including cancer, organ transplantation, and autoimmune conditions. Hepatitis C  Blood testing is recommended for:  Everyone born from [birth year] through 1963.  Anyone with known risk factors for hepatitis C. Sexually transmitted infections (STIs)  You should be screened for sexually transmitted infections (STIs) including gonorrhea and chlamydia if:  You are sexually active and are younger than 79 years of age.  You are older than 79 years of age and your health care provider tells you that you are at risk for this type of  infection.  Your sexual activity has changed since you were last screened and you are at an increased risk for chlamydia or gonorrhea. Ask your health care provider if you are at risk.  If you do not have HIV, but are at risk, it may be recommended that you take a prescription medicine daily to prevent HIV  infection. This is called pre-exposure prophylaxis (PrEP). You are considered at risk if:  You are sexually active and do not regularly use condoms or know the HIV status of your partner(s).  You take drugs by injection.  You are sexually active with a partner who has HIV. Talk with your health care provider about whether you are at high risk of being infected with HIV. If you choose to begin PrEP, you should first be tested for HIV. You should then be tested every 3 months for as long as you are taking PrEP.  PREGNANCY   If you are premenopausal and you may become pregnant, ask your health care provider about preconception counseling.  If you may become pregnant, take 400 to 800 micrograms (mcg) of folic acid every day.  If you want to prevent pregnancy, talk to your health care provider about birth control (contraception). OSTEOPOROSIS AND MENOPAUSE   Osteoporosis is a disease in which the bones lose minerals and strength with aging. This can result in serious bone fractures. Your risk for osteoporosis can be identified using a bone density scan.  If you are 85 years of age or older, or if you are at risk for osteoporosis and fractures, ask your health care provider if you should be screened.  Ask your health care provider whether you should take a calcium or vitamin D supplement to lower your risk for osteoporosis.  Menopause may have certain physical symptoms and risks.  Hormone replacement therapy may reduce some of these symptoms and risks. Talk to your health care provider about whether hormone replacement therapy is right for you.  HOME CARE INSTRUCTIONS   Schedule regular  health, dental, and eye exams.  Stay current with your immunizations.   Do not use any tobacco products including cigarettes, chewing tobacco, or electronic cigarettes.  If you are pregnant, do not drink alcohol.  If you are breastfeeding, limit how much and how often you drink alcohol.  Limit alcohol intake to no more than 1 drink per day for nonpregnant women. One drink equals 12 ounces of beer, 5 ounces of wine, or 1 ounces of hard liquor.  Do not use street drugs.  Do not share needles.  Ask your health care provider for help if you need support or information about quitting drugs.  Tell your health care provider if you often feel depressed.  Tell your health care provider if you have ever been abused or do not feel safe at home.   This information is not intended to replace advice given to you by your health care provider. Make sure you discuss any questions you have with your health care provider.   Document Released: 10/26/2010 Document Revised: 05/03/2014 Document Reviewed: 03/14/2013 [employer] Interactive Patient Education [employer].

## 2015-01-29 NOTE — Progress Notes (Signed)
Pre visit review using our clinic review tool, if applicable. No additional management support is needed unless otherwise documented below in the visit note.  Chief Complaint  Patient presents with  . Medicare Wellness    HPI: Susan Ford 81 y.o. comes in today for Preventive Medicare wellness visit . And Chronic disease management medication Since last visit.  She has seen neurology diagnosed with neuropathy and lumbar degenerative disc disease status post decompression. microcytic anemia given Celebrex as needed hydrocodone and gabapentin. Last  Hydrocodone months .   Once a day  celebrex seems to help.  hasnt tried off   Takes pepcid every day to help gi.  Lipids med no se of med   To continue   On Cardizem:  For 1  Year so far  Because of  1 hours  For post   hosp [facility] sugery  To see cards tpo decide dc med or not    No fracture or  fam hx of osteoporosis    No dexa in recnet past  Used to see dr Ubaldo Glassing and then dr Mesinger last few years   Ask for [place] to take over any gyne needs meds etc. .   Had skin cancer back to have fu   Health Maintenance  Topic Date Due  . DEXA SCAN  06/29/1996  . PNA vac Low Risk Adult (2 of 2 - PPSV23) 03/12/2015  . INFLUENZA VACCINE  11/25/2015  . TETANUS/TDAP  11/07/2021  . ZOSTAVAX  Completed   Health Maintenance Review LIFESTYLE:  TADno Sugar beverages:no Sleep:7 hours     MEDICARE DOCUMENT QUESTIONS  TO SCAN   Hearing:   Vision:  No limitations at present . Last eye check UTD  Safety:  Has smoke detector and wears seat belts.  No firearms. No excess sun exposure. Sees dentist regularly.  Falls: no  Advance directive :  Reviewed  Has one.  Memory: Felt to be good  , no concern from her or her family.  Depression: No anhedonia unusual crying or depressive symptoms  Nutrition: Eats well balanced diet; adequate calcium and vitamin D. No swallowing chewing problems.  Injury: no major injuries in the last six  months.  Other healthcare providers:  Reviewed today .  Social: widowed . No pets.   Preventive parameters: up-to-date  Reviewed   ADLS:   There are no problems or need for assistance  driving, feeding, obtaining food, dressing, toileting and bathing, managing money using phone. She is independent.  EXERCISE/ HABITS  Per week   No tobacco    etoh   ROS: having buzzing in ears   GEN/ HEENT: No fever, significant weight changes sweats headaches vision problems hearing changes, CV/ PULM; No chest pain shortness of breath cough, syncope,edema  change in exercise tolerance. GI /GU: No adominal pain, vomiting, change in bowel habits. No blood in the stool. No significant GU symptoms. SKIN/HEME: ,no acute skin  or bleeding. No lymphadenopathy, nodules, masses.  NEURO/ PSYCH:  Neuro[pathy stable wals falt footed . No depression anxiety. IMM/ Allergy: No unusual infections.  Allergy .   REST of 12 system review negative except as per HPI   Past Medical History  Diagnosis Date  . GERD (gastroesophageal reflux disease)     Had endoscopy no history of Barrett's  . Arthritis   . Cataract 2007    bilateral cat ext   . Unspecified hereditary and idiopathic peripheral neuropathy   . Cholecystitis   . Dysrhythmia 02-21-2014  after cholecystectomy had atrial fib for a few hours, none since  . Complication of anesthesia Feb 21, 2014    atrial fib after cholecystectomy, lasted a few hours, none since  . Hx of nonmelanoma skin cancer     Family History  Problem Relation Age of Onset  . Neurodegenerative disease Mother     Mikey Bussing Drager died at 58  . Alcohol abuse    . Heart disease      Social History   Social History  . Marital Status: Widowed    Spouse Name: Gwyndolyn Saxon  . Number of Children: N/A  . Years of Education: N/A   Occupational History  . Retired     Therapist, sports   Social History Main Topics  . Smoking status: Former Smoker -- 0.25 packs/day for 7 years    Types: Cigarettes     Quit date: 04/27/1963  . Smokeless tobacco: Never Used  . Alcohol Use: 4.2 oz/week    7 Glasses of wine per week     Comment: 1 glass of wine daily  . Drug Use: No  . Sexual Activity: Yes   Other Topics Concern  . None   Social History Narrative   Retired Therapist, sports    widowed since    Spring  15    [facility]    Regular exercise former smoker   G3P3   From [place] originally           Outpatient Encounter Prescriptions as of 01/29/2015  Medication Sig  . ascorbic acid (VITAMIN C) 1000 MG tablet Take 1,000 mg by mouth daily after lunch.   Marland Kitchen aspirin 81 MG tablet Take 81 mg by mouth daily after lunch.   . baclofen (LIORESAL) 10 MG tablet Take 10 mg by mouth daily.  . calcium citrate-vitamin D (CITRACAL+D) 315-200 MG-UNIT per tablet Take 1 tablet by mouth 2 (two) times daily.   . celecoxib (CELEBREX) 200 MG capsule TAKE 1 CAPSULE (200 MG TOTAL) BY MOUTH DAILY AS NEEDED (ARTHRITIS PAIN).  Marland Kitchen diltiazem (CARDIZEM CD) 120 MG 24 hr capsule TAKE ONE CAPSULE BY MOUTH EVERY DAY  . estradiol (VIVELLE-DOT) 0.025 MG/24HR Place 1 patch onto the skin once a week. On Saturday  . famotidine (PEPCID) 40 MG tablet Take 40 mg by mouth daily at 3 pm.   . gabapentin (NEURONTIN) 600 MG tablet TAKE 1 TABLET BY MOUTH 4 TIMES A DAY  . HYDROcodone-acetaminophen (NORCO) 7.5-325 MG per tablet Take 1 tablet by mouth every 6 (six) hours as needed for moderate pain.  . Multiple Vitamin (MULTIVITAMIN) tablet Take 1 tablet by mouth daily after lunch.   . simvastatin (ZOCOR) 20 MG tablet TAKE 1 TABLET (20 MG TOTAL) BY MOUTH AT BEDTIME.  . [DISCONTINUED] celecoxib (CELEBREX) 200 MG capsule TAKE 1 CAPSULE (200 MG TOTAL) BY MOUTH DAILY AS NEEDED (ARTHRITIS PAIN). (Patient taking differently: Taking one capsule twice daily.)   No facility-administered encounter medications on file as of 01/29/2015.    EXAM:  BP 134/76 mmHg  Temp(Src) 98.4 F (36.9 C) (Oral)  Ht 5' 1.5" (1.562 m)  Wt 138 lb 3.2 oz (62.687 kg)  BMI 25.69  kg/m2  Body mass index is 25.69 kg/(m^2).  Physical Exam: Vital signs reviewed RDE:YCXK is a well-developed well-nourished alert cooperative   who appears stated age in no acute distress.  Walks flat footed  HEENT: normocephalic atraumatic , Eyes: PERRL EOM's full, conjunctiva clear, Nares: paten,t no deformity discharge or tenderness., Ears: no deformity EAC's clear TMs with  normal landmarks. Mouth: clear OP, no lesions, edema.  Moist mucous membranes. Dentition in adequate repair. NECK: supple without masses, thyromegaly or bruits. CHEST/PULM:  Clear to auscultation and percussion breath sounds equal no wheeze , rales or rhonchi. No chest wall deformities or tenderness.Breast: normal by inspection . No dimpling, discharge, masses, tenderness or discharge . CV: PMI is nondisplaced, S1 S2 no gallops, murmurs, rubs. Peripheral pulses are full without delay.No JVD .  ABDOMEN: Bowel sounds normal nontender  No guard or rebound, no hepato splenomegal no CVA tenderness.   Extremtities:  No clubbing cyanosis or edema, no acute joint swelling or redness no focal atrophy NEURO:  Oriented x3, cranial nerves 3-12 appear to be intact, no obvious focal weakness,gait  Flat footed  limits  SKIN: No acute rashes normal turgor, color, no bruising or petechiae. healeing skin bx siter  PSYCH: Oriented, good eye contact, no obvious depression anxiety, cognition and judgment appear normal. LN: no cervical axillary inguinal adenopathy No noted deficits in memory, attention, and speech.   Lab Results  Component Value Date   WBC 8.3 02/26/2014   HGB 11.7* 02/26/2014   HCT 34.2* 02/26/2014   PLT 286 02/26/2014   GLUCOSE 103* 02/26/2014   CHOL 179 10/23/2013   TRIG 203.0* 10/23/2013   HDL 70.10 10/23/2013   LDLDIRECT 153 02/12/2009   LDLCALC 68 10/23/2013   ALT 59* 02/26/2014   AST 20 02/26/2014   NA 137 02/26/2014   K 3.6* 02/26/2014   CL 101 02/26/2014   CREATININE 0.54 02/26/2014   BUN 11 02/26/2014    CO2 25 02/26/2014   TSH 0.76 10/23/2013   INR 1.21 02/22/2014   BP Readings from Last 3 Encounters:  01/29/15 134/76  01/14/15 129/71  08/14/14 129/72    ASSESSMENT AND PLAN:  Discussed the following assessment and plan:  Medicare annual wellness visit, subsequent  Hyperlipidemia - Plan: Basic metabolic panel, Hepatic function panel, Lipid panel, TSH, CBC with Differential/Platelet  Neuropathy ([place]) - Plan: Basic metabolic panel, Hepatic function panel, Lipid panel, TSH, CBC with Differential/Platelet  Spinal stenosis of lumbar region - Plan: Basic metabolic panel, Hepatic function panel, Lipid panel, TSH, CBC with Differential/Platelet  Microcytic anemia - Plan: Basic metabolic panel, Hepatic function panel, Lipid panel, TSH, CBC with Differential/Platelet  Medication management - Plan: Basic metabolic panel, Hepatic function panel, Lipid panel, TSH, CBC with Differential/Platelet  Osteoporosis screening - Plan: DG Bone Density  Estrogen deficiency - Plan: DG Bone Density  Hx of atrial flutter - peri op on cardizem   no sx fu with cards   High risk medication use anemai nl iron studies   Cox 2 high risk med advise every 6 months  Renal function limit use if needed  Get hearing checked elsewhere  Fu if buzzing progressive  Patient Care Team: Burnis Medin, MD as PCP - General Amy Martinique, MD as Consulting Physician (Dermatology) Marcial Pacas, MD as Consulting Physician (Neurology)  Patient Instructions    Will notify you  of labs when available.  Advise every 6 months lab monitoring for kidney function because of taking daily celebrex   Get dexa scan appt  in the next year to look for fracture risk   yearly flu vaccine   Attention to fall prevention  It appears that you may have  had the prevnar 13 (Baby pneumococcal vaccine) x 2   Assume you have pneumovax at age 5  [facility].    Adult vaccines due  Topic Date Due  . TETANUS/TDAP   11/07/2021  . ZOSTAVAX  Completed   'ROV in 6 months  Bmp pre visit  Or at visit   Health Maintenance, Female Adopting a healthy lifestyle and getting preventive care can go a long way to promote health and wellness. Talk with your health care provider about what schedule of regular examinations is right for you. This is a good chance for you to check in with your provider about disease prevention and staying healthy. In between checkups, there are plenty of things you can do on your own. Experts have done a lot of research about which lifestyle changes and preventive measures are most likely to keep you healthy. Ask your health care provider for more information. WEIGHT AND DIET  Eat a healthy diet  Be sure to include plenty of vegetables, fruits, low-fat dairy products, and lean protein.  Do not eat a lot of foods high in solid fats, added sugars, or salt.  Get regular exercise. This is one of the most important things you can do for your health.  Most adults should exercise for at least 150 minutes each week. The exercise should increase your heart rate and make you sweat (moderate-intensity exercise).  Most adults should also do strengthening exercises at least twice a week. This is in addition to the moderate-intensity exercise.  Maintain a healthy weight  Body mass index (BMI) is a measurement that can be used to identify possible weight problems. It estimates body fat based on height and weight. Your health care provider can help determine your BMI and help you achieve or maintain a healthy weight.  For females 84 years of age and older:   A BMI below 18.5 is considered underweight.  A BMI of 18.5 to 24.9 is normal.  A BMI of 25 to 29.9 is considered overweight.  A BMI of 30 and above is considered obese.  Watch levels of cholesterol and blood lipids  You should start having your blood tested for lipids and cholesterol at 79 years of age, then have this test every 5  years.  You may need to have your cholesterol levels checked more often if:  Your lipid or cholesterol levels are high.  You are older than 79 years of age.  You are at high risk for heart disease.  CANCER SCREENING   Lung Cancer  Lung cancer screening is recommended for adults 88-56 years old who are at high risk for lung cancer because of a history of smoking.  A yearly low-dose CT scan of the lungs is recommended for people who:  Currently smoke.  Have quit within the past 15 years.  Have at least a 30-pack-year history of smoking. A pack year is smoking an average of one pack of cigarettes a day for 1 year.  Yearly screening should continue until it has been 15 years since you quit.  Yearly screening should stop if you develop a health problem that would prevent you from having lung cancer treatment.  Breast Cancer  Practice breast self-awareness. This means understanding how your breasts normally appear and feel.  It also means doing regular breast self-exams. Let your health care provider know about any changes, no matter how small.  If you are in your 20s or 30s, you should have a clinical breast exam (CBE) by a health care provider every 1-3 years as part of a regular health exam.  If you are 48 or older, have a CBE every year. Also  consider having a breast X-ray (mammogram) every year.  If you have a family history of breast cancer, talk to your health care provider about genetic screening.  If you are at high risk for breast cancer, talk to your health care provider about having an MRI and a mammogram every year.  Breast cancer gene (BRCA) assessment is recommended for women who have family members with BRCA-related cancers. BRCA-related cancers include:  Breast.  Ovarian.  Tubal.  Peritoneal cancers.  Results of the assessment will determine the need for genetic counseling and BRCA1 and BRCA2 testing. Cervical Cancer Your health care provider may  recommend that you be screened regularly for cancer of the pelvic organs (ovaries, uterus, and vagina). This screening involves a pelvic examination, including checking for microscopic changes to the surface of your cervix (Pap test). You may be encouraged to have this screening done every 3 years, beginning at age 53.  For women ages 66-65, health care providers may recommend pelvic exams and Pap testing every 3 years, or they may recommend the Pap and pelvic exam, combined with testing for human papilloma virus (HPV), every 5 years. Some types of HPV increase your risk of cervical cancer. Testing for HPV may also be done on women of any age with unclear Pap test results.  Other health care providers may not recommend any screening for nonpregnant women who are considered low risk for pelvic cancer and who do not have symptoms. Ask your health care provider if a screening pelvic exam is right for you.  If you have had past treatment for cervical cancer or a condition that could lead to cancer, you need Pap tests and screening for cancer for at least 20 years after your treatment. If Pap tests have been discontinued, your risk factors (such as having a new sexual partner) need to be reassessed to determine if screening should resume. Some women have medical problems that increase the chance of getting cervical cancer. In these cases, your health care provider may recommend more frequent screening and Pap tests. Colorectal Cancer  This type of cancer can be detected and often prevented.  Routine colorectal cancer screening usually begins at 79 years of age and continues through 79 years of age.  Your health care provider may recommend screening at an earlier age if you have risk factors for colon cancer.  Your health care provider may also recommend using home test kits to check for hidden blood in the stool.  A small camera at the end of a tube can be used to examine your colon directly  (sigmoidoscopy or colonoscopy). This is done to check for the earliest forms of colorectal cancer.  Routine screening usually begins at age 12.  Direct examination of the colon should be repeated every 5-10 years through 79 years of age. However, you may need to be screened more often if early forms of precancerous polyps or small growths are found. Skin Cancer  Check your skin from head to toe regularly.  Tell your health care provider about any new moles or changes in moles, especially if there is a change in a mole's shape or color.  Also tell your health care provider if you have a mole that is larger than the size of a pencil eraser.  Always use sunscreen. Apply sunscreen liberally and repeatedly throughout the day.  Protect yourself by wearing long sleeves, pants, a wide-brimmed hat, and sunglasses whenever you are outside. HEART DISEASE, DIABETES, AND HIGH BLOOD PRESSURE   High  blood pressure causes heart disease and increases the risk of stroke. High blood pressure is more likely to develop in:  People who have blood pressure in the high end of the normal range (130-139/85-89 mm Hg).  People who are overweight or obese.  People who are African American.  If you are 72-47 years of age, have your blood pressure checked every 3-5 years. If you are 72 years of age or older, have your blood pressure checked every year. You should have your blood pressure measured twice--once when you are at a hospital or clinic, and once when you are not at a hospital or clinic. Record the average of the two measurements. To check your blood pressure when you are not at a hospital or clinic, you can use:  An automated blood pressure machine at a pharmacy.  A home blood pressure monitor.  If you are between 65 years and 4 years old, ask your health care provider if you should take aspirin to prevent strokes.  Have regular diabetes screenings. This involves taking a blood sample to check your  fasting blood sugar level.  If you are at a normal weight and have a low risk for diabetes, have this test once every three years after 79 years of age.  If you are overweight and have a high risk for diabetes, consider being tested at a younger age or more often. PREVENTING INFECTION  Hepatitis B  If you have a higher risk for hepatitis B, you should be screened for this virus. You are considered at high risk for hepatitis B if:  You were born in a country where hepatitis B is common. Ask your health care provider which countries are considered high risk.  Your parents were born in a high-risk country, and you have not been immunized against hepatitis B (hepatitis B vaccine).  You have HIV or AIDS.  You use needles to inject street drugs.  You live with someone who has hepatitis B.  You have had sex with someone who has hepatitis B.  You get hemodialysis treatment.  You take certain medicines for conditions, including cancer, organ transplantation, and autoimmune conditions. Hepatitis C  Blood testing is recommended for:  Everyone born from [birth year] through 1963.  Anyone with known risk factors for hepatitis C. Sexually transmitted infections (STIs)  You should be screened for sexually transmitted infections (STIs) including gonorrhea and chlamydia if:  You are sexually active and are younger than 79 years of age.  You are older than 79 years of age and your health care provider tells you that you are at risk for this type of infection.  Your sexual activity has changed since you were last screened and you are at an increased risk for chlamydia or gonorrhea. Ask your health care provider if you are at risk.  If you do not have HIV, but are at risk, it may be recommended that you take a prescription medicine daily to prevent HIV infection. This is called pre-exposure prophylaxis (PrEP). You are considered at risk if:  You are sexually active and do not regularly use condoms or  know the HIV status of your partner(s).  You take drugs by injection.  You are sexually active with a partner who has HIV. Talk with your health care provider about whether you are at high risk of being infected with HIV. If you choose to begin PrEP, you should first be tested for HIV. You should then be tested every 3 months for as long  as you are taking PrEP.  PREGNANCY   If you are premenopausal and you may become pregnant, ask your health care provider about preconception counseling.  If you may become pregnant, take 400 to 800 micrograms (mcg) of folic acid every day.  If you want to prevent pregnancy, talk to your health care provider about birth control (contraception). OSTEOPOROSIS AND MENOPAUSE   Osteoporosis is a disease in which the bones lose minerals and strength with aging. This can result in serious bone fractures. Your risk for osteoporosis can be identified using a bone density scan.  If you are 34 years of age or older, or if you are at risk for osteoporosis and fractures, ask your health care provider if you should be screened.  Ask your health care provider whether you should take a calcium or vitamin D supplement to lower your risk for osteoporosis.  Menopause may have certain physical symptoms and risks.  Hormone replacement therapy may reduce some of these symptoms and risks. Talk to your health care provider about whether hormone replacement therapy is right for you.  HOME CARE INSTRUCTIONS   Schedule regular health, dental, and eye exams.  Stay current with your immunizations.   Do not use any tobacco products including cigarettes, chewing tobacco, or electronic cigarettes.  If you are pregnant, do not drink alcohol.  If you are breastfeeding, limit how much and how often you drink alcohol.  Limit alcohol intake to no more than 1 drink per day for nonpregnant women. One drink equals 12 ounces of beer, 5 ounces of wine, or 1 ounces of hard liquor.  Do  not use street drugs.  Do not share needles.  Ask your health care provider for help if you need support or information about quitting drugs.  Tell your health care provider if you often feel depressed.  Tell your health care provider if you have ever been abused or do not feel safe at home.   This information is not intended to replace advice given to you by your health care provider. Make sure you discuss any questions you have with your health care provider.   Document Released: 10/26/2010 Document Revised: 05/03/2014 Document Reviewed: 03/14/2013 Elsevier Interactive Patient Education 2016 [place]. Laniqua Torrens M.D.

## 2015-02-10 ENCOUNTER — Other Ambulatory Visit: Payer: Self-pay | Admitting: Internal Medicine

## 2015-02-10 ENCOUNTER — Ambulatory Visit (INDEPENDENT_AMBULATORY_CARE_PROVIDER_SITE_OTHER)
Admission: RE | Admit: 2015-02-10 | Discharge: 2015-02-10 | Disposition: A | Discharge status: Home / Self Care | Payer: Medicare Other | Source: Ambulatory Visit | Attending: Internal Medicine | Admitting: Internal Medicine

## 2015-02-10 ENCOUNTER — Telehealth: Payer: Self-pay | Admitting: Internal Medicine

## 2015-02-10 DIAGNOSIS — E2839 Other primary ovarian failure: Secondary | ICD-10-CM | POA: Diagnosis not present

## 2015-02-10 DIAGNOSIS — Z13820 Encounter for screening for osteoporosis: Secondary | ICD-10-CM

## 2015-02-10 NOTE — Telephone Encounter (Signed)
Left a message for a return call.

## 2015-02-10 NOTE — Telephone Encounter (Signed)
Sent to the pharmacy by e-scribe. 

## 2015-02-10 NOTE — Telephone Encounter (Signed)
Spoke to the pt.  Informed her that her prescription is at the pharmacy but it is too early to fill.  Too get an exact date on when it will be able to be filled, she will need to call and speak to the pharmacist.  Pt stated she is leaving out of town on the 20th and needs to pick up her prescription.  Advised she call and speak to the pharmacist.

## 2015-02-10 NOTE — Telephone Encounter (Signed)
Ok to refill x 1 year 

## 2015-02-10 NOTE — Telephone Encounter (Signed)
Pt needs refill on celebrex 200 mg [facility] [address]

## 2015-04-04 ENCOUNTER — Other Ambulatory Visit: Payer: Self-pay | Admitting: *Deleted

## 2015-04-04 ENCOUNTER — Telehealth: Payer: Self-pay | Admitting: Cardiology

## 2015-04-04 MED ORDER — DILTIAZEM HCL ER COATED BEADS 120 MG PO CP24: 120.0000 mg | ORAL_CAPSULE | Freq: Every day | ORAL | Status: DC

## 2015-04-04 NOTE — Telephone Encounter (Signed)
cardizem refilled sent to [facility] for advice on baclofen.

## 2015-04-04 NOTE — Telephone Encounter (Signed)
New message   *STAT* If patient is at the pharmacy, call can be transferred to refill team.   1. Which medications need to be refilled? (please list name of each medication and dose if known)   1. diltiazem (CARDIZEM CD) 120 MG 24 hr capsule 2. baclofen (LIORESAL) 10 MG tablet   2. Which pharmacy/location (including street and city if local pharmacy) is medication to be sent to? [facility] on [address] [phone]  3. Do they need a 30 day or 90 day supply? 30 day scripts

## 2015-04-04 NOTE — Telephone Encounter (Signed)
Spoke with patient and  Dr. Mare Ferrari did not Rx Baclofen, advised to contact prescribing physician

## 2015-04-07 ENCOUNTER — Other Ambulatory Visit: Payer: Self-pay | Admitting: Neurology

## 2015-04-29 ENCOUNTER — Other Ambulatory Visit: Payer: Self-pay

## 2015-04-29 DIAGNOSIS — Z1231 Encounter for screening mammogram for malignant neoplasm of breast: Secondary | ICD-10-CM

## 2015-05-12 ENCOUNTER — Other Ambulatory Visit: Payer: Self-pay | Admitting: Cardiology

## 2015-05-14 ENCOUNTER — Ambulatory Visit: Payer: Medicare Other

## 2015-05-28 ENCOUNTER — Telehealth: Payer: Self-pay | Admitting: Internal Medicine

## 2015-05-28 NOTE — Telephone Encounter (Signed)
Patient said her pharmacist said she should only be taking 10mg  of Simvastatin rather than 20mg  because she takes Celebrex.  She want's to know if the new prescription can be sent to her pharmacist.

## 2015-05-28 NOTE — Telephone Encounter (Signed)
There can be theoretical med interaction with simvastatin and  cardizem not celebrex   Can  send in simvastatin 10 mg per day disp90 refill x 2

## 2015-05-29 MED ORDER — SIMVASTATIN 10 MG PO TABS: 10.0000 mg | ORAL_TABLET | Freq: Every day | ORAL | Status: DC

## 2015-05-29 NOTE — Telephone Encounter (Signed)
Pt is aware.  

## 2015-05-29 NOTE — Telephone Encounter (Signed)
New medication sent to the pharmacy.  Left a message for a return call.  Pt needs to be notified.

## 2015-05-30 ENCOUNTER — Ambulatory Visit
Admission: RE | Admit: 2015-05-30 | Discharge: 2015-05-30 | Disposition: A | Discharge status: Home / Self Care | Payer: Medicare Other | Source: Ambulatory Visit

## 2015-05-30 DIAGNOSIS — Z1231 Encounter for screening mammogram for malignant neoplasm of breast: Secondary | ICD-10-CM

## 2015-06-04 ENCOUNTER — Telehealth: Payer: Self-pay | Admitting: Internal Medicine

## 2015-06-04 MED ORDER — ESTRADIOL 0.025 MG/24HR TD PTTW: 1.0000 | MEDICATED_PATCH | TRANSDERMAL | Status: DC

## 2015-06-04 NOTE — Telephone Encounter (Signed)
Ok to refill  Until next visit

## 2015-06-04 NOTE — Telephone Encounter (Signed)
Pt request refill  estradiol (VIVELLE-DOT) 0.025 MG/24HR  Pt would like Dr Regis Bill to take this med over for her. Records were sent from Dr Willis Modena, but she no  longer needs a OBGYN.  [place]

## 2015-06-04 NOTE — Telephone Encounter (Signed)
Sent to the pharmacy by e-scribe. 

## 2015-06-06 ENCOUNTER — Encounter: Payer: Self-pay | Admitting: Cardiology

## 2015-06-06 ENCOUNTER — Ambulatory Visit (INDEPENDENT_AMBULATORY_CARE_PROVIDER_SITE_OTHER): Payer: Medicare Other | Admitting: Cardiology

## 2015-06-06 VITALS — BP 126/74 | HR 84 | Ht 64.0 in | Wt 139.1 lb

## 2015-06-06 DIAGNOSIS — M545 Low back pain, unspecified: Secondary | ICD-10-CM

## 2015-06-06 DIAGNOSIS — G609 Hereditary and idiopathic neuropathy, unspecified: Secondary | ICD-10-CM

## 2015-06-06 DIAGNOSIS — I4892 Unspecified atrial flutter: Secondary | ICD-10-CM

## 2015-06-06 NOTE — Patient Instructions (Signed)
Medication Instructions:  STOP DILTIAZEM   Labwork: NONE  Testing/Procedures: NONE  Follow-Up: Your physician wants you to follow-up in: [place] will receive a reminder letter in the mail two months in advance. If you don't receive a letter, please call our office to schedule the follow-up appointment.  If you need a refill on your cardiac medications before your next appointment, please call your pharmacy.

## 2015-06-06 NOTE — Progress Notes (Signed)
Cardiology Office Note   Date:  06/06/2015   ID:  Susan Ford, DOB 19-Jun-1929, MRN [id number]  PCP:  Lottie Dawson, MD  Cardiologist: Darlin Coco MD  No chief complaint on file.     History of Present Illness: Susan Ford is a 82 y.o. female who presents for One-year follow-up office visit  She is a widow.  Her husband died of amiodarone toxicity.  She is a retired Equities trader.  She worked in Teacher, English as a foreign language. I had seen her during A previous hospitalization for cholelithiasis. While hospitalized she had brief atrial flutter which occurred postoperatively and resolved while she was in the hospital. She has no prior history of any atrial arrhythmias. She did not require anticoagulation at discharge. She had a subsequent outpatient 30 day monitor which did not show any recurrent atrial flutter or fibrillation. She remains in normal sinus rhythm. She denies any chest pain or shortness of breath.We had kept her on low dose diltiazem CD 120 mg daily for the past year.  She reminds me that we had told her that we would stop the diltiazem today if she remained in normal sinus rhythm.  She reports that she has had no arrhythmias over the past year.  She has been feeling well.  No chest pain.  No palpitations. She is troubled with low back pain secondary to 2 bulging disks.  She has seen Dr. Ellene Route.  She has had surgery and has continued to have difficulty.  Dr. Ellene Route has offered her a much or extensive operation which would last 7 hours and she has declined that.  She has received some injections by Dr. Ernestina Patches. 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  Past Medical History  Diagnosis Date  . GERD (gastroesophageal reflux disease)     Had endoscopy no history of Barrett's  . Arthritis   . Cataract 2007    bilateral cat ext   . Unspecified hereditary and idiopathic peripheral neuropathy   . Cholecystitis   . Dysrhythmia 02-21-2014   after cholecystectomy had atrial fib for a few hours, none since  . Complication of anesthesia Feb 21, 2014    atrial fib after cholecystectomy, lasted a few hours, none since  . Hx of nonmelanoma skin cancer     Past Surgical History  Procedure Laterality Date  . Lumbar laminectomy/decompression microdiscectomy  02/01/2012    Procedure: LUMBAR LAMINECTOMY/DECOMPRESSION MICRODISCECTOMY 2 LEVELS;  Surgeon: Kristeen Miss, MD;  Location: [facility];  Service: Neurosurgery;  Laterality: Left;  Left Lumbar Four-Five Lumbar Five-Sacral One Laminectomies/Foraminotomies/Microscope  . Cholecystectomy N/A 02/21/2014    Procedure: LAPAROSCOPIC CHOLECYSTECTOMY WITH INTRAOPERATIVE CHOLANGIOGRAM;  Surgeon: Doreen Salvage, MD;  Location: [facility];  Service: General;  Laterality: N/A;  . Ercp N/A 02/24/2014    Procedure: ENDOSCOPIC RETROGRADE CHOLANGIOPANCREATOGRAPHY (ERCP);  Surgeon: Arta Silence, MD;  Location: [facility];  Service: Endoscopy;  Laterality: N/A;  . Eye surgery Bilateral 2007    both eyes cataracts with lens replacments  . Tonsillectomy  age 43  . Abdominal hysterectomy  15 yrs ago    complete  . Endoscopic retrograde cholangiopancreatography (ercp) with propofol N/A 05/29/2014    Procedure: ENDOSCOPIC RETROGRADE CHOLANGIOPANCREATOGRAPHY (ERCP) WITH PROPOFOL;  Surgeon: Arta Silence, MD;  Location: [facility];  Service: Endoscopy;  Laterality: N/A;  . Spyglass cholangioscopy N/A 05/29/2014    Procedure: [id number] CHOLANGIOSCOPY;  Surgeon: Arta Silence, MD;  Location: [facility];  Service: Endoscopy;  Laterality: N/A;     Current  Outpatient Prescriptions  Medication Sig Dispense Refill  . ascorbic acid (VITAMIN C) 1000 MG tablet Take 1,000 mg by mouth daily after lunch.     Marland Kitchen aspirin 81 MG tablet Take 81 mg by mouth daily after lunch.     . calcium citrate-vitamin D (CITRACAL+D) 315-200 MG-UNIT per tablet Take 1 tablet by mouth daily.     . celecoxib (CELEBREX) 200 MG capsule Take 200 mg  by mouth daily.    Marland Kitchen estradiol (VIVELLE-DOT) 0.025 MG/24HR Place 1 patch onto the skin once a week. On Saturday 8 patch 2  . famotidine (PEPCID) 40 MG tablet Take 40 mg by mouth daily at 3 pm.     . gabapentin (NEURONTIN) 600 MG tablet TAKE 1 TABLET BY MOUTH 4 TIMES A DAY 360 tablet 1  . HYDROcodone-acetaminophen (NORCO) 7.5-325 MG per tablet Take 1 tablet by mouth every 6 (six) hours as needed for moderate pain. 40 tablet 0  . Multiple Vitamin (MULTIVITAMIN) tablet Take 1 tablet by mouth daily after lunch.     . simvastatin (ZOCOR) 10 MG tablet Take 1 tablet (10 mg total) by mouth at bedtime. 90 tablet 2   No current facility-administered medications for this visit.    Allergies:   Lyrica and Oxytetracycline    Social History:  The patient  reports that she quit smoking about 52 years ago. Her smoking use included Cigarettes. She has a 1.75 pack-year smoking history. She has never used smokeless tobacco. She reports that she drinks about 4.2 oz of alcohol per week. She reports that she does not use illicit drugs.   Family History:  The patient's family history includes Neurodegenerative disease in her mother.    ROS:  Please see the history of present illness.   Otherwise, review of systems are positive for none.   All other systems are reviewed and negative.    PHYSICAL EXAM: VS:  BP 126/74 mmHg  Pulse 84  Ht 5\' 4"  (1.626 m)  Wt 139 lb 1.9 oz (63.104 kg)  BMI 23.87 kg/m2 , BMI Body mass index is 23.87 kg/(m^2). GEN: Well nourished, well developed, in no acute distress HEENT: normal Neck: no JVD, carotid bruits, or masses Cardiac: RRR; no murmurs, rubs, or gallops,no edema  Respiratory:  clear to auscultation bilaterally, normal work of breathing GI: soft, nontender, nondistended, + BS MS: no deformity or atrophy Skin: warm and dry, no rash Neuro:  Strength and sensation are intact Psych: euthymic mood, full affect   EKG:  EKG is ordered today. The ekg ordered today  demonstrates Normal sinus rhythm at 84 bpm.  No ischemic changes.  Incomplete right bundle branch block.  Since prior tracing of 05/06/14, heart rate is slower.   Recent Labs: 01/29/2015: ALT 11; BUN 11; Creatinine, Ser 0.68; Hemoglobin 12.9; Platelets 274.0; Potassium 5.2*; Sodium 136; TSH 1.04    Lipid Panel    Component Value Date/Time   CHOL 159 01/29/2015 1033   TRIG 105.0 01/29/2015 1033   HDL 67.90 01/29/2015 1033   CHOLHDL 2 01/29/2015 1033   VLDL 21.0 01/29/2015 1033   LDLCALC 70 01/29/2015 1033   LDLDIRECT 153 02/12/2009      Wt Readings from Last 3 Encounters:  06/06/15 139 lb 1.9 oz (63.104 kg)  01/29/15 138 lb 3.2 oz (62.687 kg)  01/14/15 138 lb (62.596 kg)         ASSESSMENT AND PLAN:  1. Perioperative atrial flutter, resolved.No recurrent arrhythmias over the past year. 2. Severe idiopathic peripheral neuropathy,  on gabapentin 3. Sinus tachycardia,Resolved 4.  Severe low back pain followed by Dr. Ellene Route      Current medicines are reviewed at length with the patient today.  The patient has concerns regarding medicines.  The following changes have been made:  She may stop her diltiazem CD now.  She will monitor her blood pressure at home.  If her blood pressure rises, she may need to go back on diltiazem or another antihypertensive.  Labs/ tests ordered today include:  No orders of the defined types were placed in this encounter.     Disposition:   Return in one year for follow-up office visit and EKG with Dr. Marlou Porch.  Berna Spare MD 06/06/2015 12:55 PM    [address], [place], [place]  [address] Phone: [phone]; Fax: [phone]

## 2015-06-11 ENCOUNTER — Other Ambulatory Visit: Payer: Self-pay | Admitting: Cardiology

## 2015-06-13 DIAGNOSIS — Z961 Presence of intraocular lens: Secondary | ICD-10-CM | POA: Diagnosis not present

## 2015-07-17 DIAGNOSIS — R2681 Unsteadiness on feet: Secondary | ICD-10-CM | POA: Diagnosis not present

## 2015-07-17 DIAGNOSIS — M47816 Spondylosis without myelopathy or radiculopathy, lumbar region: Secondary | ICD-10-CM | POA: Diagnosis not present

## 2015-07-17 DIAGNOSIS — M545 Low back pain: Secondary | ICD-10-CM | POA: Diagnosis not present

## 2015-07-17 DIAGNOSIS — G609 Hereditary and idiopathic neuropathy, unspecified: Secondary | ICD-10-CM | POA: Diagnosis not present

## 2015-07-22 DIAGNOSIS — M545 Low back pain: Secondary | ICD-10-CM | POA: Diagnosis not present

## 2015-07-22 DIAGNOSIS — M4306 Spondylolysis, lumbar region: Secondary | ICD-10-CM | POA: Diagnosis not present

## 2015-07-24 DIAGNOSIS — M545 Low back pain: Secondary | ICD-10-CM | POA: Diagnosis not present

## 2015-07-24 DIAGNOSIS — M4306 Spondylolysis, lumbar region: Secondary | ICD-10-CM | POA: Diagnosis not present

## 2015-07-30 DIAGNOSIS — M545 Low back pain: Secondary | ICD-10-CM | POA: Diagnosis not present

## 2015-07-30 DIAGNOSIS — M4306 Spondylolysis, lumbar region: Secondary | ICD-10-CM | POA: Diagnosis not present

## 2015-08-01 DIAGNOSIS — M4306 Spondylolysis, lumbar region: Secondary | ICD-10-CM | POA: Diagnosis not present

## 2015-08-01 DIAGNOSIS — M545 Low back pain: Secondary | ICD-10-CM | POA: Diagnosis not present

## 2015-08-05 DIAGNOSIS — M4306 Spondylolysis, lumbar region: Secondary | ICD-10-CM | POA: Diagnosis not present

## 2015-08-05 DIAGNOSIS — M545 Low back pain: Secondary | ICD-10-CM | POA: Diagnosis not present

## 2015-08-13 DIAGNOSIS — M4306 Spondylolysis, lumbar region: Secondary | ICD-10-CM | POA: Diagnosis not present

## 2015-08-13 DIAGNOSIS — M545 Low back pain: Secondary | ICD-10-CM | POA: Diagnosis not present

## 2015-08-15 DIAGNOSIS — M545 Low back pain: Secondary | ICD-10-CM | POA: Diagnosis not present

## 2015-08-15 DIAGNOSIS — M4306 Spondylolysis, lumbar region: Secondary | ICD-10-CM | POA: Diagnosis not present

## 2015-08-21 ENCOUNTER — Ambulatory Visit: Payer: Medicare Other | Admitting: Neurology

## 2015-09-27 ENCOUNTER — Other Ambulatory Visit: Payer: Self-pay | Admitting: Internal Medicine

## 2015-09-30 ENCOUNTER — Other Ambulatory Visit: Payer: Self-pay | Admitting: General Practice

## 2015-10-01 ENCOUNTER — Other Ambulatory Visit: Payer: Self-pay | Admitting: General Practice

## 2015-10-01 DIAGNOSIS — M545 Low back pain, unspecified: Secondary | ICD-10-CM

## 2015-10-01 MED ORDER — CELECOXIB 200 MG PO CAPS: 200.0000 mg | ORAL_CAPSULE | Freq: Every day | ORAL | Status: DC

## 2015-10-10 ENCOUNTER — Other Ambulatory Visit: Payer: Self-pay | Admitting: Neurology

## 2015-10-15 ENCOUNTER — Ambulatory Visit (INDEPENDENT_AMBULATORY_CARE_PROVIDER_SITE_OTHER): Payer: Medicare Other | Admitting: Nurse Practitioner

## 2015-10-15 ENCOUNTER — Telehealth: Payer: Self-pay | Admitting: *Deleted

## 2015-10-15 ENCOUNTER — Encounter: Payer: Self-pay | Admitting: Nurse Practitioner

## 2015-10-15 VITALS — BP 108/64 | HR 89 | Ht 64.0 in | Wt 135.0 lb

## 2015-10-15 DIAGNOSIS — R413 Other amnesia: Secondary | ICD-10-CM

## 2015-10-15 DIAGNOSIS — M4806 Spinal stenosis, lumbar region: Secondary | ICD-10-CM | POA: Diagnosis not present

## 2015-10-15 DIAGNOSIS — G609 Hereditary and idiopathic neuropathy, unspecified: Secondary | ICD-10-CM | POA: Diagnosis not present

## 2015-10-15 DIAGNOSIS — E785 Hyperlipidemia, unspecified: Secondary | ICD-10-CM | POA: Diagnosis not present

## 2015-10-15 DIAGNOSIS — M48061 Spinal stenosis, lumbar region without neurogenic claudication: Secondary | ICD-10-CM

## 2015-10-15 NOTE — Telephone Encounter (Signed)
I attempted to call pt back.  Could not get thru.  I attempted several times since and busy signal.

## 2015-10-15 NOTE — Progress Notes (Signed)
[facility]  PATIENT: Susan Ford DOB: 04/17/1930   REASON FOR VISIT: Follow-up for peripheral neuropathy, spinal stenosis lumbar region abnormality of gait HISTORY FROM: Patient    HISTORY OF PRESENT ILLNESS:Susan Ford is a 82 year-old right-handed white female,She was a patient of Dr. Erling Cruz, last visit was May 2014   She had a history of lumbar decompression surgery, presenting with chronic low back pain, mild gait difficulty due to pain.  MRI of the lumbar spine 12/25/2010 shows diffuse spondylitic changes and progression of degenerative changes at L3-4 and L4-5 and modest broad disc bulge at L5-S1. There is evidence of neuroforaminal stenosis on the left at L4-5. She has pain in her feet and aching from her knees down.  EMG/NCV 12/24/2011 showing progression of peripheral neuropathy versus a prior study 09/13/2011. There was also a mild overlying acute L5 radiculopathy on the left. She underwent surgical decompression at L4-5 and L5-S1 with bilateral foraminotomies and microdiscectomy on the left at L4-5 02/01/2012 by Dr. Ellene Route, Surgery helped her. She still complains of pain at low back pain, waist line, going down her leg, at both leg.  She denies bowel/bladder incontinence  She has difficulty walking, because of low back pain, worst pain in the morning, she usually take her hydrocodone early morning, then napping for a while, then she can function again.  Previously she has tried Lyrica which caused abdominal cramps, Cymbalta, caused nausea. She had side effects from nortriptyline 20 mg per night. She is on gabapentin 600, 1-1/2 tablets 4 times per day and complains of memory loss on the medication and now questions any improvement in pain. She uses a cane, but not in her house.   She underwent an epidural steroid injection 10/2011 with improvement in her back pain.   She is independent in activities of daily living including driving a car,  cooking,cleaning, shopping, and doing laundry.   UPDATE April 20th 2016:YY Last epidural injection was in 2015, she still has a lot of low back pain, she was put on nortriptyline 25 mg qhs, neurontin 600mg  qid, hydrocodone prn, celebrex 200mg  daily.  We have reviewed MRI lumbar in Feb 2016: Multilevel degenerative disc disease, with mild spinal stenosis, facet arthropathy, mild to moderate foraminal stenosis at different level She complains of constant low back pian, bilatera legs achy pain, triggered by standing for just 10 minutes, she has to sit down resting after standing or walking for short period of time  She is taking Gabapentin 600 mg 4 times a day, Celexa 200 mg every day, recently started nortriptyline 25 mg every night hydrocodone as needed previously tried Cymbalta, without helping her symptoms, untolerable side effect  UPDATE Jan 14 2015:YY She is alone at visit, she still takes celebrex 200mg  bid, Neurontin 600 mg 4 times a day, she could stand for 10 mintes, then has to sit down because of her low back pain, she also complains of intermittent bilateral lower extremity deep achy pain, She rarely needs narcotics now, denies bowel and bladder incontinence, no bilateral lower extremity numbness UPDATE 10/15/2015. Susan Ford is a 82 y.o. year old female who has new complaint of  memory issues for about 6 months.  The patient does do the finances in the home.  The patient does drive. She denies any difficulty with this and no motor vehicle accidents in the recent years.  The patient does  cook.  No safety issues identified. The patient is able to perform own ADL's.  The patient is able to distribute her own medications.  The patients bladder and bowel are  under good control.   There have been no hallucinations. She complains with short-term memory loss, misplacing her keys and forgetting where they are. She has misplaced other things. She claims her sons has started noticing things  as well and they come in from [place]. She is concerned regarding this. In addition she returns for follow-up of her spinal stenosis and neuropathy. She remains on gabapentin and Celebrex. She rarely takes narcotics. She has failed Cymbalta in the past. She claims she can only stand for about 10 minutes and down because of her low back pain. She returns for reevaluation   REVIEW OF SYSTEMS: Full 14 system review of systems performed and notable only for those listed, all others are neg:  Constitutional: neg  Cardiovascular: neg Ear/Nose/Throat: neg  Skin: neg Eyes: neg Respiratory: neg Gastroitestinal: neg  Hematology/Lymphatic: neg  Endocrine: neg Musculoskeletal: Chronic back pain Allergy/Immunology: neg Neurological: Memory loss Psychiatric: neg Sleep : neg   ALLERGIES: Allergies  Allergen Reactions  . Lyrica [Pregabalin] Other (See Comments)    "High Fever"   . Oxytetracycline Other (See Comments)    "High Fever" caused by terramycin    HOME MEDICATIONS: Outpatient Prescriptions Prior to Visit  Medication Sig Dispense Refill  . ascorbic acid (VITAMIN C) 1000 MG tablet Take 1,000 mg by mouth daily after lunch.     Marland Kitchen aspirin 81 MG tablet Take 81 mg by mouth daily after lunch.     . calcium citrate-vitamin D (CITRACAL+D) 315-200 MG-UNIT per tablet Take 1 tablet by mouth daily.     . celecoxib (CELEBREX) 200 MG capsule Take 1 capsule (200 mg total) by mouth daily. 90 capsule 0  . estradiol (VIVELLE-DOT) 0.025 MG/24HR Place 1 patch onto the skin once a week. On Saturday 8 patch 2  . famotidine (PEPCID) 40 MG tablet Take 40 mg by mouth daily at 3 pm.     . gabapentin (NEURONTIN) 600 MG tablet TAKE 1 TABLET BY MOUTH 4 TIMES A DAY 360 tablet 1  . HYDROcodone-acetaminophen (NORCO) 7.5-325 MG per tablet Take 1 tablet by mouth every 6 (six) hours as needed for moderate pain. 40 tablet 0  . Multiple Vitamin (MULTIVITAMIN) tablet Take 1 tablet by mouth daily after lunch.     .  simvastatin (ZOCOR) 10 MG tablet Take 1 tablet (10 mg total) by mouth at bedtime. 90 tablet 2   No facility-administered medications prior to visit.    PAST MEDICAL HISTORY: Past Medical History  Diagnosis Date  . GERD (gastroesophageal reflux disease)     Had endoscopy no history of Barrett's  . Arthritis   . Cataract 2007    bilateral cat ext   . Unspecified hereditary and idiopathic peripheral neuropathy   . Cholecystitis   . Dysrhythmia 02-21-2014    after cholecystectomy had atrial fib for a few hours, none since  . Complication of anesthesia Feb 21, 2014    atrial fib after cholecystectomy, lasted a few hours, none since  . Hx of nonmelanoma skin cancer     PAST SURGICAL HISTORY: Past Surgical History  Procedure Laterality Date  . Lumbar laminectomy/decompression microdiscectomy  02/01/2012    Procedure: LUMBAR LAMINECTOMY/DECOMPRESSION MICRODISCECTOMY 2 LEVELS;  Surgeon: Kristeen Miss, MD;  Location: [facility];  Service: Neurosurgery;  Laterality: Left;  Left Lumbar Four-Five Lumbar Five-Sacral One Laminectomies/Foraminotomies/Microscope  . Cholecystectomy N/A 02/21/2014    Procedure: LAPAROSCOPIC CHOLECYSTECTOMY WITH INTRAOPERATIVE  CHOLANGIOGRAM;  Surgeon: Doreen Salvage, MD;  Location: [facility];  Service: General;  Laterality: N/A;  . Ercp N/A 02/24/2014    Procedure: ENDOSCOPIC RETROGRADE CHOLANGIOPANCREATOGRAPHY (ERCP);  Surgeon: Arta Silence, MD;  Location: [facility] END[facility]COPY;  Service: Endoscopy;  Laterality: N/A;  . Eye surgery Bilateral 2007    both eyes cataracts with lens replacments  . Tonsillectomy  age 17  . Abdominal hysterectomy  15 yrs ago    complete  . Endoscopic retrograde cholangiopancreatography (ercp) with propofol N/A 05/29/2014    Procedure: ENDOSCOPIC RETROGRADE CHOLANGIOPANCREATOGRAPHY (ERCP) WITH PROPOFOL;  Surgeon: Arta Silence, MD;  Location: [facility];  Service: Endoscopy;  Laterality: N/A;  . Spyglass cholangioscopy N/A 05/29/2014    Procedure:  [id number] CHOLANGIOSCOPY;  Surgeon: Arta Silence, MD;  Location: [facility];  Service: Endoscopy;  Laterality: N/A;    FAMILY HISTORY: Family History  Problem Relation Age of Onset  . Neurodegenerative disease Mother     Mikey Bussing Drager died at 25  . Alcohol abuse    . Heart disease      SOCIAL HISTORY: Social History   Social History  . Marital Status: Widowed    Spouse Name: Gwyndolyn Saxon  . Number of Children: N/A  . Years of Education: N/A   Occupational History  . Retired     Therapist, sports   Social History Main Topics  . Smoking status: Former Smoker -- 0.25 packs/day for 7 years    Types: Cigarettes    Quit date: 04/27/1963  . Smokeless tobacco: Never Used  . Alcohol Use: 4.2 oz/week    7 Glasses of wine per week     Comment: 1 glass of wine daily  . Drug Use: No  . Sexual Activity: Yes   Other Topics Concern  . Not on file   Social History Narrative   Retired Therapist, sports    widowed since    Spring  17    [facility] of [facility]    Regular exercise former smoker   G3P3   From [place] originally            PHYSICAL EXAM  Filed Vitals:   10/15/15 1337  BP: 108/64  Pulse: 89  Height: 5\' 4"  (1.626 m)  Weight: 135 lb (61.236 kg)   Body mass index is 23.16 kg/(m^2). Gen: NAD, conversant, well nourised, obese, well groomed  Cardiovascular: Regular rate rhythm,  Neck: Supple, no carotid bruit. NEUROLOGICAL EXAM:  MENTAL STATUS: Speech:  Speech is normal; fluent and spontaneous with normal comprehension.  Cognition:MMSE 26/30  The patient is oriented to person, place, and time;   missed 2 of 3 recall items;   language fluent;   missed 2 items in calculation CRANIAL NERVES: CN II: Visual fields are full to confrontation. Pupil equal round reactive to light CN III, IV, VI: extraocular movement are normal. No ptosis. CN V: Facial sensation is intact to pinprick in all 3 divisions bilaterally.   CN VII: Face is symmetric with normal eye closure and  smile. CN VIII: Hearing is normal to rubbing fingers CN IX, X: Palate elevates symmetrically. Phonation is normal. CN XI: Head turning and shoulder shrug are intact CN XII: Tongue is midline with normal movements and no atrophy. Motor: normal bulk and tone, full strength in the BUE, BLE, fine finger movements normal, no pronator drift. No focal weakness Sensory: normal and symmetric to light touch, pinprick, and  Vibration,   Coordination: finger-nose-finger, heel-to-shin bilaterally, no dysmetria Reflexes: Symmetric upper and lower plantar responses were flexor bilaterally. Gait  and Station: Mild antalgic cautious gait, no assistive device, Romberg absent DIAGNOSTIC DATA (LABS, IMAGING, TESTING) - I reviewed patient records, labs, notes, testing and imaging myself where available.  Lab Results  Component Value Date   WBC 7.5 01/29/2015   HGB 12.9 01/29/2015   HCT 39.7 01/29/2015   MCV 94.9 01/29/2015   PLT 274.0 01/29/2015      Component Value Date/Time   NA 136 01/29/2015 1033   K 5.2* 01/29/2015 1033   CL 101 01/29/2015 1033   CO2 28 01/29/2015 1033   GLUCOSE 95 01/29/2015 1033   BUN 11 01/29/2015 1033   CREATININE 0.68 01/29/2015 1033   CALCIUM 9.5 01/29/2015 1033   PROT 6.3 01/29/2015 1033   ALBUMIN 4.1 01/29/2015 1033   AST 16 01/29/2015 1033   ALT 11 01/29/2015 1033   ALKPHOS 54 01/29/2015 1033   BILITOT 0.6 01/29/2015 1033   GFRNONAA 86* 02/26/2014 0508   GFRAA >90 02/26/2014 0508   Lab Results  Component Value Date   CHOL 159 01/29/2015   HDL 67.90 01/29/2015   LDLCALC 70 01/29/2015   LDLDIRECT 153 02/12/2009   TRIG 105.0 01/29/2015   CHOLHDL 2 01/29/2015    Lab Results  Component Value Date   VITAMINB12 541 08/14/2014   Lab Results  Component Value Date   TSH 1.04 01/29/2015      ASSESSMENT AND PLAN EULINE MCCORY is a 82 y.o. female with past medical history of lumbar degenerative disc disease, status post decompression, now with  persistent low back pain, bilateral lower extremity deep achy pain, on polypharmacy treatment,Celebrex 200 mg a day, hydrocodone as needed, gabapentin 600 mg 4 times . She has a new complaint of short-term memory loss going on for about 6 months now.  Continue gabapentin 600 mg 4 times a day for now Celebrex 200 mg twice a day Labs for memory loss today MRI of the brain to be scheduled F/U in 2 months with Dr. Krista Blue to review resultsVst time 81 min Susan Ford, [place], [facility], [facility] [address], [place], [place] [address] [phone]

## 2015-10-15 NOTE — Patient Instructions (Signed)
Continue gabapentin 600 mg 4 times a day for now Celebrex 200 mg twice a day Labs for memory loss MRI of the brain to be scheduled F/U in 2 months with Dr. Krista Blue to review results

## 2015-10-15 NOTE — Telephone Encounter (Signed)
Pt called, has a question about the Celebrex 200 mg twice a day.   Please advise [phone]

## 2015-10-16 LAB — BASIC METABOLIC PANEL
BUN/Creatinine Ratio: 18 (ref 12–28)
BUN: 12 mg/dL (ref 8–27)
CO2: 25 mmol/L (ref 18–29)
Calcium: 9.4 mg/dL (ref 8.7–10.3)
Chloride: 96 mmol/L (ref 96–106)
Creatinine, Ser: 0.66 mg/dL (ref 0.57–1.00)
GFR calc Af Amer: 94 mL/min/{1.73_m2} (ref >59)
GFR calc non Af Amer: 81 mL/min/{1.73_m2} (ref >59)
Glucose: 109 mg/dL — ABNORMAL HIGH (ref 65–99)
Potassium: 4.7 mmol/L (ref 3.5–5.2)
Sodium: 134 mmol/L (ref 134–144)

## 2015-10-16 LAB — DEMENTIA PANEL
Homocysteine: 10.7 umol/L (ref 0.0–15.0)
RPR Ser Ql: NONREACTIVE
TSH: 1.21 u[IU]/mL (ref 0.450–4.500)
Vitamin B-12: 495 pg/mL (ref 211–946)

## 2015-10-16 LAB — SEDIMENTATION RATE: Sed Rate: 3 mm/hr (ref 0–40)

## 2015-10-16 LAB — C-REACTIVE PROTEIN: CRP: 0.6 mg/L (ref 0.0–4.9)

## 2015-10-16 NOTE — Telephone Encounter (Signed)
Attempted to call pt again.  Could not get thru.

## 2015-10-16 NOTE — Telephone Encounter (Signed)
Med sheet is current

## 2015-10-16 NOTE — Telephone Encounter (Signed)
I called and (it asked for robo call code).  I placed code , then just beeped, (almost like fax).  I could not leave message.

## 2015-10-16 NOTE — Telephone Encounter (Signed)
I called and spoke to pt.   On her AVS it stated celebrex 200mg  po bid.  She thought this might be an error.  She has been taking only once daily, and wants to keep it that way.  FYI.

## 2015-10-16 NOTE — Progress Notes (Signed)
I have reviewed and agreed above plan. 

## 2015-10-17 ENCOUNTER — Telehealth: Payer: Self-pay | Admitting: *Deleted

## 2015-10-17 ENCOUNTER — Encounter: Payer: Self-pay | Admitting: *Deleted

## 2015-10-17 NOTE — Telephone Encounter (Signed)
I spoke to pt after made letter re: her lab results.   I mailed copy of results as well as telling her about results.

## 2015-10-17 NOTE — Telephone Encounter (Signed)
I called pt, she has robo caller block.  I could not get thru and LM.  Will mail results.

## 2015-10-17 NOTE — Telephone Encounter (Signed)
-----   Message from Dennie Bible, NP sent at 10/16/2015  4:48 PM EDT ----- Labs drawn for reversiable causes of memory loss returned normal. Please call

## 2015-10-31 ENCOUNTER — Ambulatory Visit
Admission: RE | Admit: 2015-10-31 | Discharge: 2015-10-31 | Disposition: A | Discharge status: Home / Self Care | Payer: Medicare Other | Source: Ambulatory Visit | Attending: Nurse Practitioner | Admitting: Nurse Practitioner

## 2015-10-31 ENCOUNTER — Telehealth: Payer: Self-pay | Admitting: Neurology

## 2015-10-31 DIAGNOSIS — R2689 Other abnormalities of gait and mobility: Secondary | ICD-10-CM | POA: Diagnosis not present

## 2015-10-31 DIAGNOSIS — R413 Other amnesia: Secondary | ICD-10-CM

## 2015-10-31 NOTE — Telephone Encounter (Signed)
Patient paged the oncall said we have been trying to call her, I do not see any recent phone messages in the notes. Please call her back on Monday thanks.

## 2015-11-03 NOTE — Telephone Encounter (Signed)
Pt called back today, said she had several calls on Friday after the office closed from the clinis. Sts she had MRI on Friday and is concerned it was regarding the results. Sts her phone is programmed not to take nuisance call. I called her number and it will not take the clinic number. She is asking if she can be called from a cell phone number. There is not documentation that a call has made to her and she is aware of this.

## 2015-11-03 NOTE — Telephone Encounter (Signed)
Attempted to reach patient - unable to connect call.

## 2015-11-03 NOTE — Telephone Encounter (Signed)
Spoke to patient - she is aware of lab results and will wait to hear back about her MRI results.

## 2015-11-03 NOTE — Telephone Encounter (Signed)
Pt called said she disconnected the "nuisiance calls" program so there will not be a problem in the future to call her.

## 2015-11-04 NOTE — Telephone Encounter (Signed)
Spoke to patient - she would like to get her MRI results.  She will be away from her phone tomorrow from 11-2 but it is okay to leave a detailed message on her phone or call outside of those hours.

## 2015-11-04 NOTE — Telephone Encounter (Signed)
Patient called back regarding results. Please call [phone].

## 2015-11-05 ENCOUNTER — Telehealth: Payer: Self-pay | Admitting: *Deleted

## 2015-11-05 NOTE — Telephone Encounter (Signed)
I called and spoke to pt and relayed that her MRI was without acute findings.  She verbalized understanding.  Has f/u appt with dr. Krista Blue on 12-31-15 and will go over more specifics.

## 2015-11-05 NOTE — Telephone Encounter (Signed)
-----   Message from Dennie Bible, NP sent at 10/31/2015 12:26 PM EDT ----- MRI of the brain without acute findings. Please follow up with Dr. Krista Blue as planned

## 2015-11-15 ENCOUNTER — Other Ambulatory Visit: Payer: Self-pay | Admitting: Internal Medicine

## 2015-11-17 ENCOUNTER — Telehealth: Payer: Self-pay | Admitting: Family Medicine

## 2015-11-17 NOTE — Telephone Encounter (Signed)
Pt needs a medicare wellness in Oct 2017.  Please help the pt to make this fasting appointment.  Thanks!

## 2015-11-17 NOTE — Telephone Encounter (Signed)
Sent to the pharmacy by e-scribe.  Message sent to scheduling.  Pt due for medicare wellness in Oct. 17.

## 2015-11-18 NOTE — Telephone Encounter (Signed)
lmom for pt to call back

## 2015-11-26 NOTE — Telephone Encounter (Signed)
lmom for pt to call back

## 2015-11-28 NOTE — Telephone Encounter (Signed)
Pt has been sch

## 2015-12-31 ENCOUNTER — Ambulatory Visit (INDEPENDENT_AMBULATORY_CARE_PROVIDER_SITE_OTHER): Payer: Medicare Other | Admitting: Neurology

## 2015-12-31 ENCOUNTER — Encounter: Payer: Self-pay | Admitting: Neurology

## 2015-12-31 VITALS — BP 121/71 | HR 97 | Ht 64.0 in | Wt 135.0 lb

## 2015-12-31 DIAGNOSIS — R413 Other amnesia: Secondary | ICD-10-CM | POA: Diagnosis not present

## 2015-12-31 DIAGNOSIS — G629 Polyneuropathy, unspecified: Secondary | ICD-10-CM

## 2015-12-31 MED ORDER — MEMANTINE HCL 10 MG PO TABS
10.0000 mg | ORAL_TABLET | Freq: Two times a day (BID) | ORAL | 11 refills | Status: DC
Start: 2015-12-31 — End: 2016-01-29

## 2015-12-31 NOTE — Progress Notes (Signed)
[facility]  PATIENT: Susan Ford DOB: [birth year]   REASON FOR VISIT: Follow-up for peripheral neuropathy, spinal stenosis lumbar region abnormality of gait HISTORY FROM: Patient    HISTORY OF PRESENT ILLNESS:Susan Ford is a 82 year-old right-handed white female,She was a patient of Dr. Erling Cruz, last visit was May 2014   She had a history of lumbar decompression surgery, presenting with chronic low back pain, mild gait difficulty due to pain.  MRI of the lumbar spine 12/25/2010 shows diffuse spondylitic changes and progression of degenerative changes at L3-4 and L4-5 and modest broad disc bulge at L5-S1. There is evidence of neuroforaminal stenosis on the left at L4-5. She has pain in her feet and aching from her knees down.  EMG/NCV 12/24/2011 showing progression of peripheral neuropathy versus a prior study 09/13/2011. There was also a mild overlying acute L5 radiculopathy on the left. She underwent surgical decompression at L4-5 and L5-S1 with bilateral foraminotomies and microdiscectomy on the left at L4-5 02/01/2012 by Dr. Ellene Route, Surgery helped her. She still complains of pain at low back pain, waist line, going down her leg, at both leg.  She denies bowel/bladder incontinence  She has difficulty walking, because of low back pain, worst pain in the morning, she usually take her hydrocodone early morning, then napping for a while, then she can function again.  Previously she has tried Lyrica which caused abdominal cramps, Cymbalta, caused nausea. She had side effects from nortriptyline 20 mg per night. She is on gabapentin 600, 1-1/2 tablets 4 times per day and complains of memory loss on the medication and now questions any improvement in pain. She uses a cane, but not in her house.   She underwent an epidural steroid injection 10/2011 with improvement in her back pain.   She is independent in activities of daily living including driving a car,  cooking,cleaning, shopping, and doing laundry.   UPDATE April 20th 2016:YY Last epidural injection was in 2015, she still has a lot of low back pain, she was put on nortriptyline 25 mg qhs, neurontin 61m qid, hydrocodone prn, celebrex 2087mdaily.  We have reviewed MRI lumbar in Feb 2016: Multilevel degenerative disc disease, with mild spinal stenosis, facet arthropathy, mild to moderate foraminal stenosis at different level She complains of constant low back pian, bilatera legs achy pain, triggered by standing for just 10 minutes, she has to sit down resting after standing or walking for short period of time  She is taking Gabapentin 600 mg 4 times a day, Celexa 200 mg every day, recently started nortriptyline 25 mg every night hydrocodone as needed previously tried Cymbalta, without helping her symptoms, untolerable side effect  UPDATE Jan 14 2015:  She is alone at visit, she still takes celebrex 20050mid, Neurontin 600 mg 4 times a day, she could stand for 10 mintes, then has to sit down because of her low back pain, she also complains of intermittent bilateral lower extremity deep achy pain, She rarely needs narcotics now, denies bowel and bladder incontinence, no bilateral lower extremity numbness  UPDATE Sept 6th 2017: Last clinical visit was with CarHoyle Sauer June 2017, she is concerned about her memory loss, Laboratory evaluation in June 2017 showed normal TSH, B12, ESR, negative RPR, mild elevated glucose 109 on BMP, I have personally reviewed MRI of the brain in July 2017, moderate generalized atrophy, mild supratentorium small vessel disease,  She lives alone, still drives, but reported she got lost sometimes, today's Mini-Mental status  examination 27 out of 30  REVIEW OF SYSTEMS: Full 14 system review of systems performed and notable only for those listed, all others are neg:  Memory loss   ALLERGIES: Allergies  Allergen Reactions  . Lyrica [Pregabalin] Other (See Comments)     "High Fever"   . Oxytetracycline Other (See Comments)    "High Fever" caused by terramycin    HOME MEDICATIONS: Outpatient Medications Prior to Visit  Medication Sig Dispense Refill  . ascorbic acid (VITAMIN C) 1000 MG tablet Take 1,000 mg by mouth daily after lunch.     Marland Kitchen aspirin 81 MG tablet Take 81 mg by mouth daily after lunch.     . calcium citrate-vitamin D (CITRACAL+D) 315-200 MG-UNIT per tablet Take 1 tablet by mouth daily.     . celecoxib (CELEBREX) 200 MG capsule Take 1 capsule (200 mg total) by mouth daily. 90 capsule 0  . estradiol (CLIMARA - DOSED IN MG/24 HR) 0.025 mg/24hr patch PLACE 1 PATCH ONTO THE SKIN ONCE A WEEK. ON SATURDAY 8 patch 1  . gabapentin (NEURONTIN) 600 MG tablet TAKE 1 TABLET BY MOUTH 4 TIMES A DAY 360 tablet 1  . HYDROcodone-acetaminophen (NORCO) 7.5-325 MG per tablet Take 1 tablet by mouth every 6 (six) hours as needed for moderate pain. 40 tablet 0  . Multiple Vitamin (MULTIVITAMIN) tablet Take 1 tablet by mouth daily after lunch.     . simvastatin (ZOCOR) 10 MG tablet Take 1 tablet (10 mg total) by mouth at bedtime. 90 tablet 2  . famotidine (PEPCID) 40 MG tablet Take 40 mg by mouth daily at 3 pm.      No facility-administered medications prior to visit.     PAST MEDICAL HISTORY: Past Medical History:  Diagnosis Date  . Arthritis   . Cataract 2007   bilateral cat ext   . Cholecystitis   . Complication of anesthesia Feb 21, 2014   atrial fib after cholecystectomy, lasted a few hours, none since  . Dysrhythmia 02-21-2014   after cholecystectomy had atrial fib for a few hours, none since  . GERD (gastroesophageal reflux disease)    Had endoscopy no history of Barrett's  . Hx of nonmelanoma skin cancer   . Unspecified hereditary and idiopathic peripheral neuropathy     PAST SURGICAL HISTORY: Past Surgical History:  Procedure Laterality Date  . ABDOMINAL HYSTERECTOMY  15 yrs ago   complete  . CHOLECYSTECTOMY N/A 02/21/2014   Procedure:  LAPAROSCOPIC CHOLECYSTECTOMY WITH INTRAOPERATIVE CHOLANGIOGRAM;  Surgeon: Doreen Salvage, MD;  Location: [place];  Service: General;  Laterality: N/A;  . ENDOSCOPIC RETROGRADE CHOLANGIOPANCREATOGRAPHY (ERCP) WITH PROPOFOL N/A 05/29/2014   Procedure: ENDOSCOPIC RETROGRADE CHOLANGIOPANCREATOGRAPHY (ERCP) WITH PROPOFOL;  Surgeon: Arta Silence, MD;  Location: [facility];  Service: Endoscopy;  Laterality: N/A;  . ERCP N/A 02/24/2014   Procedure: ENDOSCOPIC RETROGRADE CHOLANGIOPANCREATOGRAPHY (ERCP);  Surgeon: Arta Silence, MD;  Location: [facility];  Service: Endoscopy;  Laterality: N/A;  . EYE SURGERY Bilateral 2007   both eyes cataracts with lens replacments  . LUMBAR LAMINECTOMY/DECOMPRESSION MICRODISCECTOMY  02/01/2012   Procedure: LUMBAR LAMINECTOMY/DECOMPRESSION MICRODISCECTOMY 2 LEVELS;  Surgeon: Kristeen Miss, MD;  Location: [facility];  Service: Neurosurgery;  Laterality: Left;  Left Lumbar Four-Five Lumbar Five-Sacral One Laminectomies/Foraminotomies/Microscope  . SPYGLASS CHOLANGIOSCOPY N/A 05/29/2014   Procedure: SPYGLASS CHOLANGIOSCOPY;  Surgeon: Arta Silence, MD;  Location: [facility];  Service: Endoscopy;  Laterality: N/A;  . TONSILLECTOMY  age 46    FAMILY HISTORY: Family History  Problem Relation Age of Onset  .  Neurodegenerative disease Mother     Mikey Bussing Drager died at 58  . Alcohol abuse    . Heart disease      SOCIAL HISTORY: Social History   Social History  . Marital status: Widowed    Spouse name: Gwyndolyn Saxon  . Number of children: N/A  . Years of education: N/A   Occupational History  . Retired     Therapist, sports   Social History Main Topics  . Smoking status: Former Smoker    Packs/day: 0.25    Years: 7.00    Types: Cigarettes    Quit date: 04/27/1963  . Smokeless tobacco: Never Used  . Alcohol use 4.2 oz/week    7 Glasses of wine per week     Comment: 1 glass of wine daily  . Drug use: No  . Sexual activity: Yes   Other Topics Concern  . Not on file   Social History  Narrative   Retired Therapist, sports    widowed since    Spring  15    [facility] of 2    Regular exercise former smoker   G3P3   From [place] originally            PHYSICAL EXAM  Vitals:   12/31/15 1435  BP: 121/71  Pulse: 97  Weight: 135 lb (61.2 kg)  Height: '5\' 4"'  (1.626 m)   Body mass index is 23.17 kg/m. Gen: NAD, conversant, well nourised, obese, well groomed  Cardiovascular: Regular rate rhythm,  Neck: Supple, no carotid bruit. NEUROLOGICAL EXAM:  MENTAL STATUS: Speech:    Speech is normal; fluent and spontaneous with normal comprehension.  Cognition: Mini-Mental Status Examination 27/30     Orientation to time, place and person     Recent and remote memory: She missed 3/3 recalls     Normal Attention span and concentration     Normal Language, naming, repeating,spontaneous speech     Fund of knowledge    CRANIAL NERVES: CN II: Visual fields are full to confrontation. Pupil equal round reactive to light CN III, IV, VI: extraocular movement are normal. No ptosis. CN V: Facial sensation is intact to pinprick in all 3 divisions bilaterally.   CN VII: Face is symmetric with normal eye closure and smile. CN VIII: Hearing is normal to rubbing fingers CN IX, X: Palate elevates symmetrically. Phonation is normal. CN XI: Head turning and shoulder shrug are intact CN XII: Tongue is midline with normal movements and no atrophy. Motor: normal bulk and tone, full strength in the BUE, BLE, fine finger movements normal, no pronator drift. No focal weakness Sensory: normal and symmetric to light touch, pinprick, and  Vibration,   Coordination: finger-nose-finger, heel-to-shin bilaterally, no dysmetria Reflexes: Symmetric upper and lower plantar responses were flexor bilaterally. Gait and Station: Mild antalgic cautious gait, no assistive device, Romberg absent  DIAGNOSTIC DATA (LABS, IMAGING, TESTING) - I reviewed patient records, labs, notes, testing and imaging  myself where available.  Lab Results  Component Value Date   WBC 7.5 01/29/2015   HGB 12.9 01/29/2015   HCT 39.7 01/29/2015   MCV 94.9 01/29/2015   PLT 274.0 01/29/2015      Component Value Date/Time   NA 134 10/15/2015 1432   K 4.7 10/15/2015 1432   CL 96 10/15/2015 1432   CO2 25 10/15/2015 1432   GLUCOSE 109 (H) 10/15/2015 1432   GLUCOSE 95 01/29/2015 1033   BUN 12 10/15/2015 1432   CREATININE 0.66 10/15/2015 1432   CALCIUM 9.4 10/15/2015 1432  PROT 6.3 01/29/2015 1033   ALBUMIN 4.1 01/29/2015 1033   AST 16 01/29/2015 1033   ALT 11 01/29/2015 1033   ALKPHOS 54 01/29/2015 1033   BILITOT 0.6 01/29/2015 1033   GFRNONAA 81 10/15/2015 1432   GFRAA 94 10/15/2015 1432   Lab Results  Component Value Date   CHOL 159 01/29/2015   HDL 67.90 01/29/2015   LDLCALC 70 01/29/2015   LDLDIRECT 153 02/12/2009   TRIG 105.0 01/29/2015   CHOLHDL 2 01/29/2015    Lab Results  Component Value Date   HIPRKSYS57 334 10/15/2015   Lab Results  Component Value Date   TSH 1.210 10/15/2015      ASSESSMENT AND PLAN CELESTER MORGAN is a 82y.o. female Mild cognitive impairment  Mini-Mental Status Examination 27/30 today  Evidence of at least moderate generalized atrophy, mild supratentorium small vessel disease  I have encouraged her continue have moderate exercise,  Keep daily aspirin  Add on Namenda 10 mg twice a day  Follow-up appointment with her child in 6 months,  Chronic low back pain, lumbar radiculopathy, mild gait abnormality  She has been on gabapentin 600 mg 4 times a day for a while she may consider cutting back to lower dose,  Susan Ford, M.D. Ph.D.  [facility] [place], [place] [address] Phone: [phone] Fax:      [phone]

## 2016-01-05 ENCOUNTER — Telehealth: Payer: Self-pay | Admitting: Neurology

## 2016-01-05 NOTE — Telephone Encounter (Signed)
Patient is calling stating she is having problems with medication memantine (NAMENDA) 10 MG tablet. She has been dizzy since taking this medication and is not very stable walking. Please calll and discuss.

## 2016-01-05 NOTE — Telephone Encounter (Signed)
Spoke to patient - she started feeling dizzy after starting Namenda 10mg , BID.  She is going to try 5mg , BID for 1-2 weeks then attempt to increase the dose again.  She is hopeful this medication will be beneficial.  She will call back if symptoms persist.

## 2016-01-05 NOTE — Telephone Encounter (Signed)
Reviewed, agree with the change

## 2016-01-06 ENCOUNTER — Other Ambulatory Visit: Payer: Self-pay | Admitting: *Deleted

## 2016-01-06 MED ORDER — MEMANTINE HCL 5 MG PO TABS: 5.0000 mg | ORAL_TABLET | Freq: Two times a day (BID) | ORAL | 0 refills | Status: DC

## 2016-01-06 NOTE — Telephone Encounter (Signed)
Pt called said she tried to cut the mediation in half as instructed but it falls apart. She is wanting to know if she could get RX for 5mg  sent to [facility]. She is wanting to pick it up today, she is going out of town today. She is requesting a phone call when RX is called to pharmacy

## 2016-01-06 NOTE — Telephone Encounter (Signed)
Lower dose Namenda sent to pharmacy. She is taking Namenda 5mg , BID with the goal of titrating her dose to 10mg , BID.

## 2016-01-12 ENCOUNTER — Other Ambulatory Visit: Payer: Self-pay | Admitting: Internal Medicine

## 2016-01-12 DIAGNOSIS — M545 Low back pain, unspecified: Secondary | ICD-10-CM

## 2016-01-13 DIAGNOSIS — Z23 Encounter for immunization: Secondary | ICD-10-CM | POA: Diagnosis not present

## 2016-01-14 NOTE — Telephone Encounter (Signed)
Sent to the pharmacy by e-scribe for 90 days.  Pt has upcoming cpx on 02/18/16

## 2016-01-15 ENCOUNTER — Ambulatory Visit: Payer: Medicare Other | Admitting: Nurse Practitioner

## 2016-01-28 NOTE — Progress Notes (Signed)
Pre visit review using our clinic review tool, if applicable. No additional management support is needed unless otherwise documented below in the visit note.  Chief Complaint  Patient presents with  . Rash    Itchy/Trunk of body, hand and scalp    HPI: Susan Ford 82 y.o.  Problem based appt  Itching and rash   Onset of namenda a month ago and stopped after days cause of side effect   Itching started  7- 10 days later  Off med .   Now on trunk body hips and under arms.  Used benadryl cream    .   Last a bit .  Nothing else.  No hx of same   .  Began on back Has been persistent and been difficult using topical over-the-counter Benadryl with minimal help. No swelling cough no new medicines otherwise uses Dove soap no other topicals.  Being seen neuro for neuropathy  Sx  And mild ci doesn't think her cognitive impairment is significant enough at her age to be on the medicine. ROS: See pertinent positives and negatives per HPI. No cough fever.  Past Medical History:  Diagnosis Date  . Arthritis   . Cataract 2007   bilateral cat ext   . Cholecystitis   . Complication of anesthesia Feb 21, 2014   atrial fib after cholecystectomy, lasted a few hours, none since  . Dysrhythmia 02-21-2014   after cholecystectomy had atrial fib for a few hours, none since  . GERD (gastroesophageal reflux disease)    Had endoscopy no history of Barrett's  . Hx of nonmelanoma skin cancer   . Unspecified hereditary and idiopathic peripheral neuropathy     Family History  Problem Relation Age of Onset  . Neurodegenerative disease Mother     Mikey Bussing Drager died at 56  . Alcohol abuse    . Heart disease      Social History   Social History  . Marital status: Widowed    Spouse name: Gwyndolyn Saxon  . Number of children: N/A  . Years of education: N/A   Occupational History  . Retired     Therapist, sports   Social History Main Topics  . Smoking status: Former Smoker    Packs/day: 0.25    Years: 7.00   Types: Cigarettes    Quit date: 04/27/1963  . Smokeless tobacco: Never Used  . Alcohol use 4.2 oz/week    7 Glasses of wine per week     Comment: 1 glass of wine daily  . Drug use: No  . Sexual activity: Yes   Other Topics Concern  . 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 ascorbic acid (VITAMIN C) 1000 MG tablet Take 1,000 mg by mouth daily after lunch.     Marland Kitchen aspirin 81 MG tablet Take 81 mg by mouth daily after lunch.     . calcium citrate-vitamin D (CITRACAL+D) 315-200 MG-UNIT per tablet Take 1 tablet by mouth daily.     . celecoxib (CELEBREX) 200 MG capsule TAKE 1 CAPSULE (200 MG TOTAL) BY MOUTH DAILY. 90 capsule 0  . estradiol (CLIMARA - DOSED IN MG/24 HR) 0.025 mg/24hr patch PLACE 1 PATCH  ONTO THE SKIN ONCE A WEEK. ON SATURDAY 8 patch 1  . famotidine (PEPCID) 20 MG tablet Take 20 mg by mouth daily.    Marland Kitchen gabapentin (NEURONTIN) 600 MG tablet TAKE 1 TABLET BY MOUTH 4 TIMES A DAY 360 tablet 1  . Multiple Vitamin (MULTIVITAMIN) tablet Take 1 tablet by mouth daily after lunch.     . simvastatin (ZOCOR) 10 MG tablet Take 1 tablet (10 mg total) by mouth at bedtime. 90 tablet 2  . HYDROcodone-acetaminophen (NORCO) 7.5-325 MG per tablet Take 1 tablet by mouth every 6 (six) hours as needed for moderate pain. 40 tablet 0  . memantine (NAMENDA) 10 MG tablet Take 1 tablet (10 mg total) by mouth 2 (two) times daily. 60 tablet 11  . memantine (NAMENDA) 5 MG tablet Take 1 tablet (5 mg total) by mouth 2 (two) times daily. 60 tablet 0   No facility-administered medications prior to visit.      EXAM:  BP 138/74 (BP Location: Right Arm, Patient Position: Sitting, Cuff Size: Normal)   Temp 98.3 F (36.8 C) (Oral)   Ht 5\' 4"  (1.626 m)   Wt 134 lb 6.4 oz (61 kg)   BMI 23.07 kg/m   Body mass  index is 23.07 kg/m.  GENERAL: vitals reviewed and listed above, alert, oriented, appears well hydrated and in no acute distress  HEENT: atraumatic, conjunctiva  clear, no obvious abnormalities on inspection of external nose and ears OP : no lesion edema or exudate  NECK: no obvious masses on inspection palpation  LUNGS: clear to auscultation bilaterally, no wheezes, rales or rhonchi, good air movement CV: HRRR, no clubbing cyanosis or  peripheral edema nl cap refill  skin dry excoriated dermatographia scratches upper back under breasts right lateral pelvis not on face or hands. No vesicles or crusting. No pustules or satellite lesions. MS: moves all extremities without noticeable focal  abnormality PSYCH: pleasant and cooperative, no obvious depression or anxiety BP Readings from Last 3 Encounters:  01/29/16 138/74  12/31/15 121/71  10/15/15 108/64    ASSESSMENT AND PLAN:  Discussed the following assessment and plan:  Pruritic rash I doubt this is from [place] but it was correct to stop the medicine. Reminds me of intertrigo and winter eczema although in the truncal region. No systemic symptoms obvious. Short-term antihistamine prednisone taper local care follow-up if persistent progressive. Cautioned side effects of medicines discussed. -Patient advised to return or notify health care team  if symptoms worsen ,persist or new concerns arise.  Patient Instructions  You can take the prednisone taper to decrease itching and allergic reaction. If you're improving you can taper more quickly than the instructions. Use non-irritating moisturizer may help the dry skin and thus the itching. Avoid scratching if this makes itching worse. Cool to lukewarm bath showers are better than hot which will make the itching worse. Until better try taking over-the-counter Zyrtec plain generic  certrizine which is an antihistamine to decrease itching also. Stronger cortisone cream for local areas not on the  face.   Let us know if you're not improving over the next number of days. Or if it's recurring.     Standley Brooking. Catalia Massett M.D.

## 2016-01-29 ENCOUNTER — Encounter: Payer: Self-pay | Admitting: Internal Medicine

## 2016-01-29 ENCOUNTER — Ambulatory Visit (INDEPENDENT_AMBULATORY_CARE_PROVIDER_SITE_OTHER): Payer: Medicare Other | Admitting: Internal Medicine

## 2016-01-29 VITALS — BP 138/74 | Temp 98.3°F | Ht 64.0 in | Wt 134.4 lb

## 2016-01-29 DIAGNOSIS — L282 Other prurigo: Secondary | ICD-10-CM

## 2016-01-29 MED ORDER — PREDNISONE 10 MG PO TABS: ORAL_TABLET | ORAL | 0 refills | Status: DC

## 2016-01-29 MED ORDER — TRIAMCINOLONE ACETONIDE 0.1 % EX CREA: 1.0000 "application " | TOPICAL_CREAM | Freq: Two times a day (BID) | CUTANEOUS | 0 refills | Status: DC

## 2016-01-29 NOTE — Patient Instructions (Addendum)
You can take the prednisone taper to decrease itching and allergic reaction. If you're improving you can taper more quickly than the instructions. Use non-irritating moisturizer may help the dry skin and thus the itching. Avoid scratching if this makes itching worse. Cool to lukewarm bath showers are better than hot which will make the itching worse. Until better try taking over-the-counter Zyrtec plain generic  certrizine which is an antihistamine to decrease itching also. Stronger cortisone cream for local areas not on the face.   Let us know if you're not improving over the next number of days. Or if it's recurring.

## 2016-02-11 DIAGNOSIS — L270 Generalized skin eruption due to drugs and medicaments taken internally: Secondary | ICD-10-CM | POA: Diagnosis not present

## 2016-02-11 DIAGNOSIS — D692 Other nonthrombocytopenic purpura: Secondary | ICD-10-CM | POA: Diagnosis not present

## 2016-02-11 DIAGNOSIS — H61001 Unspecified perichondritis of right external ear: Secondary | ICD-10-CM | POA: Diagnosis not present

## 2016-02-11 DIAGNOSIS — L570 Actinic keratosis: Secondary | ICD-10-CM | POA: Diagnosis not present

## 2016-02-11 DIAGNOSIS — Z85828 Personal history of other malignant neoplasm of skin: Secondary | ICD-10-CM | POA: Diagnosis not present

## 2016-02-11 DIAGNOSIS — L821 Other seborrheic keratosis: Secondary | ICD-10-CM | POA: Diagnosis not present

## 2016-02-17 NOTE — Progress Notes (Signed)
Pre visit review using our clinic review tool, if applicable. No additional management support is needed unless otherwise documented below in the visit note.  Chief Complaint  Patient presents with  . Medicare Wellness    HPI: Susan Ford 82 y.o. comes in today for Preventive Medicare wellness visit .Since last visit. Gall bladder    Last year.  nrutopsthy  On gabapentin  celebrex  Daily  :     orig [place]  Back arhtiritsi  And hands .  pepcid once a day  Careful foods  Does have urinary frequency but no specific UTI symptoms.  Health Maintenance  Topic Date Due  . TETANUS/TDAP  11/07/2021  . INFLUENZA VACCINE  Addressed  . DEXA SCAN  Completed  . ZOSTAVAX  Completed  . PNA vac Low Risk Adult  Completed   Health Maintenance Review LIFESTYLE:  TAD  ocass  Wine  No tobacco rd  Sugar beverages: ocass once a day sweet   Tea   Sleep:   About 6    MEDICARE DOCUMENT QUESTIONS  TO SCAN   Hearing:  Ok  Little off   Vision:  No limitations at present . Last eye check UTD  Safety:  Has smoke detector and wears seat belts.  No firearms. No excess sun exposure. Sees dentist regularly.  Falls:  no  Advance directive :  Reviewed  Has one.  Memory: Felt to be good  , no concern from her or her family. So so   Depression: No anhedonia unusual crying or depressive symptoms  Nutrition: Eats well balanced diet; adequate calcium and vitamin D. No swallowing chewing problems.  Injury: no major injuries in the last six months.  Other healthcare providers:  Reviewed today .  Social:  hh of 1  Widowed gets out social No pets.   Preventive parameters: up-to-date  Reviewed   ADLS:   There are no problems or need for assistance  driving, feeding, obtaining food, dressing, toileting and bathing, managing money using phone. She is independent.doesn drive at night    ROS:  GEN/ HEENT: No fever, significant weight changes sweats headaches vision problems hearing changes, CV/  PULM; No chest pain shortness of breath cough, syncope,edema  change in exercise tolerance. GI /GU: No adominal pain, vomiting, change in bowel habits. No blood in the stool. No significant GU symptoms. SKIN/HEME: ,no acute skin rashes suspicious lesions or bleeding. No lymphadenopathy, nodules, masses.  NEURO/ PSYCH:  No neurologic signs such as weakness numbness. No depression anxiety. IMM/ Allergy: No unusual infections.  Allergy .   REST of 12 system review negative except as per HPI   Past Medical History:  Diagnosis Date  . Arthritis   . Cataract 2007   bilateral cat ext   . Cholecystitis   . Complication of anesthesia Feb 21, 2014   atrial fib after cholecystectomy, lasted a few hours, none since  . Dysrhythmia 02-21-2014   after cholecystectomy had atrial fib for a few hours, none since  . GERD (gastroesophageal reflux disease)    Had endoscopy no history of Barrett's  . Hx of nonmelanoma skin cancer   . Unspecified hereditary and idiopathic peripheral neuropathy     Family History  Problem Relation Age of Onset  . Neurodegenerative disease Mother     Mikey Bussing Drager died at 23  . Alcohol abuse    . Heart disease      Social History   Social History  . Marital status: Widowed  Spouse name: Gwyndolyn Saxon  . Number of children: N/A  . Years of education: N/A   Occupational History  . Retired     Therapist, sports   Social History Main Topics  . Smoking status: Former Smoker    Packs/day: 0.25    Years: 7.00    Types: Cigarettes    Quit date: 04/27/1963  . Smokeless tobacco: Never Used  . Alcohol use 4.2 oz/week    7 Glasses of wine per week     Comment: 1 glass of wine daily  . Drug use: No  . Sexual activity: Yes   Other Topics Concern  . Not on file   Social History Narrative   Retired Therapist, sports    widowed since    Spring  15    [facility] of 2    Regular exercise former smoker   G3P3   From [place] originally           Outpatient Encounter Prescriptions as of 02/18/2016    Medication Sig  . ascorbic acid (VITAMIN C) 1000 MG tablet Take 1,000 mg by mouth daily after lunch.   Marland Kitchen aspirin 81 MG tablet Take 81 mg by mouth daily after lunch.   . calcium citrate-vitamin D (CITRACAL+D) 315-200 MG-UNIT per tablet Take 1 tablet by mouth daily.   . celecoxib (CELEBREX) 200 MG capsule TAKE 1 CAPSULE (200 MG TOTAL) BY MOUTH DAILY.  Marland Kitchen estradiol (CLIMARA - DOSED IN MG/24 HR) 0.025 mg/24hr patch PLACE 1 PATCH ONTO THE SKIN ONCE A WEEK. ON SATURDAY  . famotidine (PEPCID) 20 MG tablet Take 20 mg by mouth daily.  Marland Kitchen gabapentin (NEURONTIN) 600 MG tablet TAKE 1 TABLET BY MOUTH 4 TIMES A DAY  . Multiple Vitamin (MULTIVITAMIN) tablet Take 1 tablet by mouth daily after lunch.   . simvastatin (ZOCOR) 10 MG tablet Take 1 tablet (10 mg total) by mouth at bedtime.  . triamcinolone cream (KENALOG) 0.1 % Apply 1 application topically 2 (two) times daily.  . [DISCONTINUED] predniSONE (DELTASONE) 10 MG tablet Take pills per day,6,6,6,4,4,4,2,2,2,1,1,1   No facility-administered encounter medications on file as of 02/18/2016.     EXAM:  BP 140/72 (BP Location: Left Arm, Patient Position: Sitting, Cuff Size: Normal)   Temp 98.1 F (36.7 C) (Oral)   Ht 5' 1.5" (1.562 m)   Wt 136 lb 3.2 oz (61.8 kg)   BMI 25.32 kg/m   Body mass index is 25.32 kg/m.  Physical Exam: Vital signs reviewed [id number] is a well-developed well-nourished alert cooperative   who appears stated age in no acute distress.  HEENT: normocephalic atraumatic , Eyes: PERRL EOM's full, conjunctiva clear, Nares: paten,t no deformity discharge or tenderness., Ears: no deformity EAC's clear TMs with normal landmarks. Mouth: clear OP, no lesions, edema.  Moist mucous membranes. Dentition in adequate repair. NECK: supple without masses, thyromegaly or bruits. CHEST/PULM:  Clear to auscultation and percussion breath sounds equal no wheeze , rales or rhonchi. No chest wall deformities or tenderness.mild kyphosis Breast: normal by  inspection . No dimpling, discharge, masses, tenderness or discharge . CV: PMI is nondisplaced, S1 S2 no gallops, murmurs, rubs. Peripheral pulses are full without delay.No JVD .  ABDOMEN: Bowel sounds normal nontender  No guard or rebound, no hepato splenomegal no CVA tenderness.   Extremtities:  No clubbing cyanosis or edema, no acute joint swelling or redness no focal atrophy NEURO:  Oriented x3, cranial nerves 3-12 appear to be intact, no obvious focal weakness,gait within normal limits no abnormal reflexes or asymmetrical  feet look good   SKIN: No acute rashes normal turgor, color, no bruising or petechiae. PSYCH: Oriented, good eye contact, no obvious depression anxiety, cognition and judgment appear normal. LN: no cervical axillary inguinal adenopathy No noted deficits in memory, attention, and speech. Obvious   ASSESSMENT AND PLAN:  Discussed the following assessment and plan:  Medicare annual wellness visit, subsequent  Medication management  Nocturia - Plan: Basic metabolic panel, Lipid panel, TSH, Hepatic function panel, CBC with Differential/Platelet, POCT Urinalysis Dipstick (Automated), Hemoglobin A1c, CANCELED: CBC with Differential/Platelet  Spinal stenosis of lumbar region, unspecified whether neurogenic claudication present  Arthritis - celebrex helps  hasnt tried to limit yet  - Plan: Basic metabolic panel, Lipid panel, TSH, Hepatic function panel, CBC with Differential/Platelet, POCT Urinalysis Dipstick (Automated), Hemoglobin A1c, CANCELED: CBC with Differential/Platelet  Neuropathy (HCC) - Plan: Basic metabolic panel, Lipid panel, TSH, Hepatic function panel, CBC with Differential/Platelet, POCT Urinalysis Dipstick (Automated), Hemoglobin A1c, CANCELED: CBC with Differential/Platelet  Hyperlipidemia, unspecified hyperlipidemia type - Plan: Basic metabolic panel, Lipid panel, TSH, Hepatic function panel, CBC with Differential/Platelet, POCT Urinalysis Dipstick  (Automated), Hemoglobin A1c, CANCELED: CBC with Differential/Platelet  Hyperglycemia - Plan: Basic metabolic panel, Lipid panel, TSH, Hepatic function panel, CBC with Differential/Platelet, POCT Urinalysis Dipstick (Automated), Hemoglobin A1c, CANCELED: CBC with Differential/Platelet  Need for 23-polyvalent pneumococcal polysaccharide vaccine - Plan: Pneumococcal polysaccharide vaccine 23-valent greater than or equal to 2yo subcutaneous/IM Discussed risk benefit of medication including low-dose HRT and Celebrex. She is aware and will limit use as she feels needed. She appears to be functioning quite well this time with her current medication profile. Patient Care Team: Burnis Medin, MD as PCP - General Amy Martinique, MD as Consulting Physician (Dermatology) Marcial Pacas, MD as Consulting Physician (Neurology)  Patient Instructions  Continue lifestyle intervention healthy eating and activity  . Will notify you  of labs when available.  Try decreasing celebrex use to as needed 1-2 weeks at a time or other Get adequate calcium vit d in diet or supplements .   Continue fall prevention.  ROV in 6 months med check     .  Susan Ford M.D.

## 2016-02-18 ENCOUNTER — Ambulatory Visit (INDEPENDENT_AMBULATORY_CARE_PROVIDER_SITE_OTHER): Payer: Medicare Other | Admitting: Internal Medicine

## 2016-02-18 ENCOUNTER — Encounter: Payer: Self-pay | Admitting: Internal Medicine

## 2016-02-18 VITALS — BP 140/72 | Temp 98.1°F | Ht 61.5 in | Wt 136.2 lb

## 2016-02-18 DIAGNOSIS — M48061 Spinal stenosis, lumbar region without neurogenic claudication: Secondary | ICD-10-CM

## 2016-02-18 DIAGNOSIS — E785 Hyperlipidemia, unspecified: Secondary | ICD-10-CM

## 2016-02-18 DIAGNOSIS — M1990 Unspecified osteoarthritis, unspecified site: Secondary | ICD-10-CM | POA: Diagnosis not present

## 2016-02-18 DIAGNOSIS — R739 Hyperglycemia, unspecified: Secondary | ICD-10-CM

## 2016-02-18 DIAGNOSIS — Z79899 Other long term (current) drug therapy: Secondary | ICD-10-CM

## 2016-02-18 DIAGNOSIS — Z0000 Encounter for general adult medical examination without abnormal findings: Secondary | ICD-10-CM

## 2016-02-18 DIAGNOSIS — G629 Polyneuropathy, unspecified: Secondary | ICD-10-CM

## 2016-02-18 DIAGNOSIS — R351 Nocturia: Secondary | ICD-10-CM

## 2016-02-18 DIAGNOSIS — Z23 Encounter for immunization: Secondary | ICD-10-CM

## 2016-02-18 LAB — CBC WITH DIFFERENTIAL/PLATELET
Basophils Absolute: 0.1 10*3/uL (ref 0.0–0.1)
Basophils Relative: 0.8 % (ref 0.0–3.0)
Eosinophils Absolute: 0.4 10*3/uL (ref 0.0–0.7)
Eosinophils Relative: 6.1 % — ABNORMAL HIGH (ref 0.0–5.0)
HCT: 38.5 % (ref 36.0–46.0)
Hemoglobin: 12.8 g/dL (ref 12.0–15.0)
Lymphocytes Relative: 23.9 % (ref 12.0–46.0)
Lymphs Abs: 1.5 10*3/uL (ref 0.7–4.0)
MCHC: 33.3 g/dL (ref 30.0–36.0)
MCV: 93.8 fl (ref 78.0–100.0)
Monocytes Absolute: 0.4 10*3/uL (ref 0.1–1.0)
Monocytes Relative: 6.1 % (ref 3.0–12.0)
Neutro Abs: 4.1 10*3/uL (ref 1.4–7.7)
Neutrophils Relative %: 63.1 % (ref 43.0–77.0)
Platelets: 218 10*3/uL (ref 150.0–400.0)
RBC: 4.11 Mil/uL (ref 3.87–5.11)
RDW: 13.5 % (ref 11.5–15.5)
WBC: 6.5 10*3/uL (ref 4.0–10.5)

## 2016-02-18 LAB — BASIC METABOLIC PANEL
BUN: 10 mg/dL (ref 6–23)
CO2: 26 mEq/L (ref 19–32)
Calcium: 9 mg/dL (ref 8.4–10.5)
Chloride: 102 mEq/L (ref 96–112)
Creatinine, Ser: 0.61 mg/dL (ref 0.40–1.20)
GFR: 99.16 mL/min (ref >60.00)
Glucose, Bld: 87 mg/dL (ref 70–99)
Potassium: 4.4 mEq/L (ref 3.5–5.1)
Sodium: 136 mEq/L (ref 135–145)

## 2016-02-18 LAB — POC URINALSYSI DIPSTICK (AUTOMATED)
Bilirubin, UA: NEGATIVE
Blood, UA: NEGATIVE
Glucose, UA: NEGATIVE
Ketones, UA: NEGATIVE
Nitrite, UA: NEGATIVE
Protein, UA: NEGATIVE
Spec Grav, UA: 1.015
Urobilinogen, UA: 0.2
pH, UA: 7

## 2016-02-18 LAB — HEPATIC FUNCTION PANEL
ALT: 14 U/L (ref 0–35)
AST: 15 U/L (ref 0–37)
Albumin: 3.9 g/dL (ref 3.5–5.2)
Alkaline Phosphatase: 55 U/L (ref 39–117)
Bilirubin, Direct: 0.1 mg/dL (ref 0.0–0.3)
Total Bilirubin: 0.7 mg/dL (ref 0.2–1.2)
Total Protein: 6.3 g/dL (ref 6.0–8.3)

## 2016-02-18 LAB — LIPID PANEL
Cholesterol: 186 mg/dL (ref 0–200)
HDL: 80 mg/dL (ref >39.00)
LDL Cholesterol: 81 mg/dL (ref 0–99)
NonHDL: 105.61
Total CHOL/HDL Ratio: 2
Triglycerides: 123 mg/dL (ref 0.0–149.0)
VLDL: 24.6 mg/dL (ref 0.0–40.0)

## 2016-02-18 LAB — HEMOGLOBIN A1C: Hgb A1c MFr Bld: 5.6 % (ref 4.6–6.5)

## 2016-02-18 LAB — TSH: TSH: 0.81 u[IU]/mL (ref 0.35–4.50)

## 2016-02-18 NOTE — Patient Instructions (Addendum)
Continue lifestyle intervention healthy eating and activity  . Will notify you  of labs when available.  Try decreasing celebrex use to as needed 1-2 weeks at a time or other Get adequate calcium vit d in diet or supplements .   Continue fall prevention.  ROV in 6 months med check     .

## 2016-02-26 ENCOUNTER — Other Ambulatory Visit: Payer: Self-pay | Admitting: Family Medicine

## 2016-02-26 ENCOUNTER — Other Ambulatory Visit (INDEPENDENT_AMBULATORY_CARE_PROVIDER_SITE_OTHER): Payer: Medicare Other

## 2016-02-26 DIAGNOSIS — R8290 Unspecified abnormal findings in urine: Secondary | ICD-10-CM

## 2016-02-26 LAB — URINALYSIS, ROUTINE W REFLEX MICROSCOPIC
Bilirubin Urine: NEGATIVE
Hgb urine dipstick: NEGATIVE
Ketones, ur: NEGATIVE
Nitrite: NEGATIVE
RBC / HPF: NONE SEEN (ref 0–?)
Specific Gravity, Urine: <=1.005 — AB (ref 1.000–1.030)
Total Protein, Urine: NEGATIVE
Urine Glucose: NEGATIVE
Urobilinogen, UA: 0.2 (ref 0.0–1.0)
pH: 7.5 (ref 5.0–8.0)

## 2016-02-28 LAB — URINE CULTURE

## 2016-03-02 ENCOUNTER — Other Ambulatory Visit: Payer: Self-pay | Admitting: Family Medicine

## 2016-03-02 MED ORDER — CEPHALEXIN 500 MG PO CAPS: 500.0000 mg | ORAL_CAPSULE | Freq: Three times a day (TID) | ORAL | 0 refills | Status: DC

## 2016-03-03 ENCOUNTER — Other Ambulatory Visit: Payer: Self-pay | Admitting: Internal Medicine

## 2016-03-03 NOTE — Telephone Encounter (Signed)
Sent to the pharmacy by e-scribe for 1 year. 

## 2016-03-04 ENCOUNTER — Telehealth: Payer: Self-pay | Admitting: Internal Medicine

## 2016-03-04 MED ORDER — SULFAMETHOXAZOLE-TRIMETHOPRIM 800-160 MG PO TABS: 1.0000 | ORAL_TABLET | Freq: Two times a day (BID) | ORAL | 0 refills | Status: DC

## 2016-03-04 NOTE — Telephone Encounter (Signed)
Please have triage  Or clinical contact patient and get more information   This med is unuusal to case  Dizziness  So need  Documentation  Before switching to  Another med.

## 2016-03-04 NOTE — Telephone Encounter (Signed)
Spoke to the pt.  She no longer wanted to take the Keflex.  Sent in Septra per directed by [facility].  Added Keflex to allergy list under intolerance.

## 2016-03-04 NOTE — Telephone Encounter (Signed)
Misty pt is calling again stating if Dr. Regis Bill is going to call her something else in she can have her neighbor go and pick it up.  Pt state the medication she was given is really making her very very dizzy and need something to help her out very soon.

## 2016-03-04 NOTE — Telephone Encounter (Signed)
Spoke to the pt and the dizziness started when she got out of bed this morning.  Pt still has frequency but no other sx.  Denied fever.  Medication was started on 03/03/16.  Had 3 doses of medication.  Last dose taken at 8 PM last night.  Looked on RxList and dizziness is a side effect of the medication.

## 2016-03-04 NOTE — Telephone Encounter (Signed)
Pt would like a call back concerning her medication.

## 2016-03-04 NOTE — Telephone Encounter (Signed)
Option one    Decrease to taking  Just  Twice a day  abd if not dizzy then take 7 days total  Option 2   Change to  Septra ds 1 po bid for  5 days disp 10

## 2016-03-06 ENCOUNTER — Other Ambulatory Visit: Payer: Self-pay | Admitting: Internal Medicine

## 2016-03-08 NOTE — Telephone Encounter (Signed)
Sent to the pharmacy by e-scribe.  Pt has follow up scheduled for 08/19/16.

## 2016-03-13 IMAGING — CT CT ABD-PELV W/ CM
2 of 5 series · 16 of 46 positions shown, 18 images · IV contrast (Omni 300)
Comparison: 07/14/2009

CLINICAL DATA: Epigastric pain with emesis.

EXAM:
CT ABDOMEN AND PELVIS WITH CONTRAST
TECHNIQUE: Multidetector CT imaging of the abdomen and pelvis was performed
using the standard protocol following bolus administration of
intravenous contrast.
CONTRAST:  100mL OMNIPAQUE IOHEXOL 300 MG/ML  SOLN

[Series 2: abd/ pelvis 5.0 i30f 1 · axial · 0.66mm/px · z∈[-567,-202]mm · 13 of 83 slices shown, 15 images]
[im 5/83  soft-tissue]
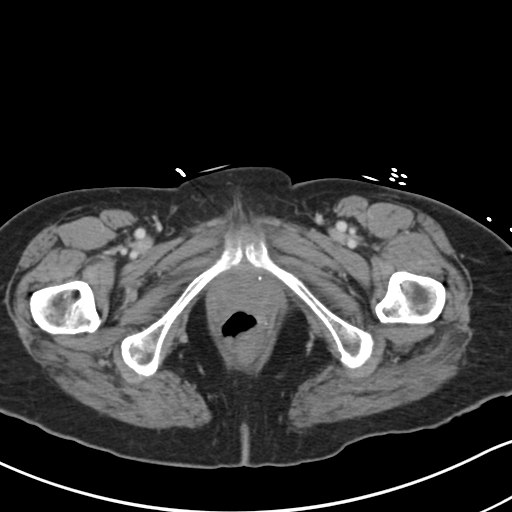
[im 5/83  bone]
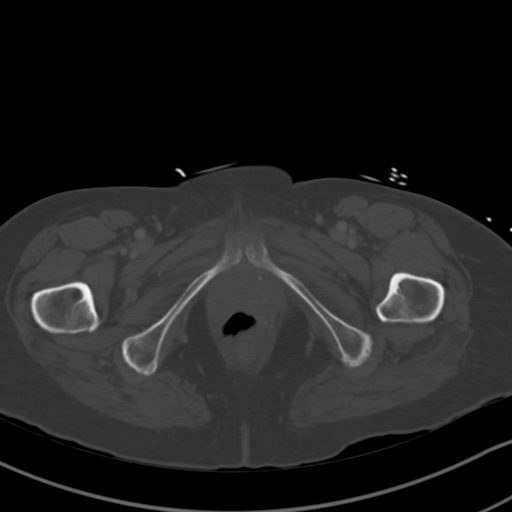
[im 10/83  soft-tissue]
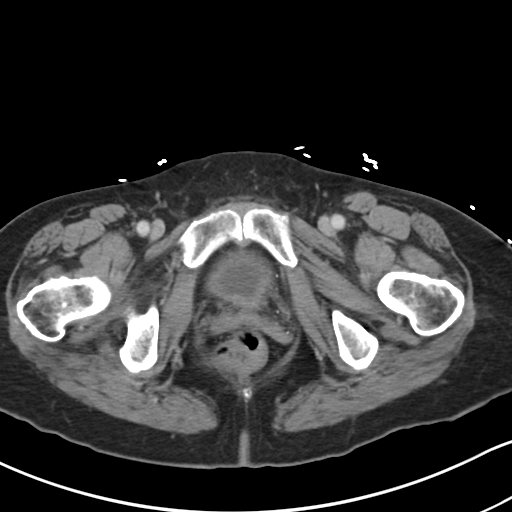
[im 19/83  soft-tissue]
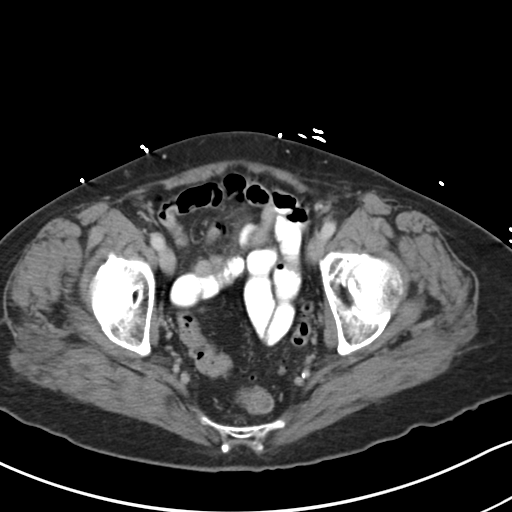
[im 23/83  soft-tissue]
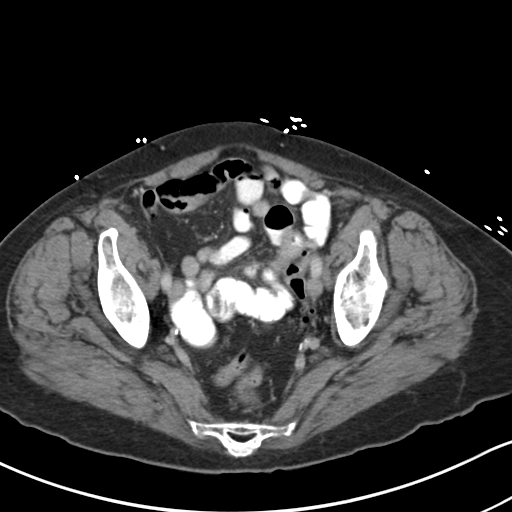
[im 28/83  soft-tissue]
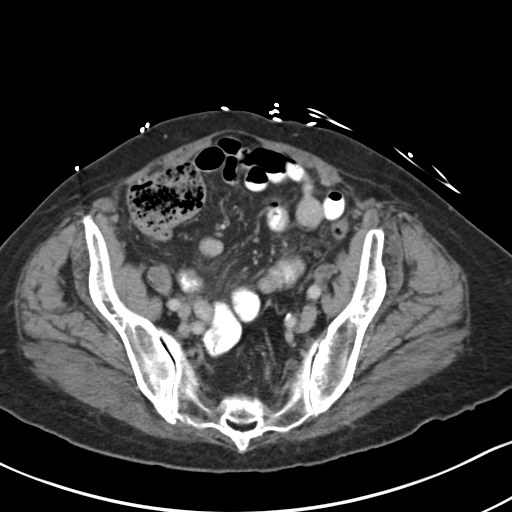
[im 37/83  soft-tissue]
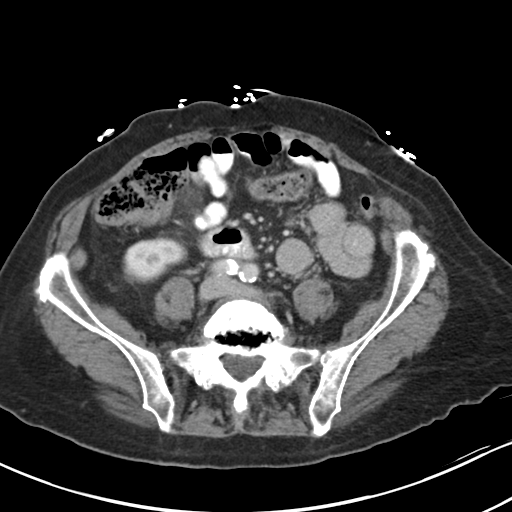
[im 42/83  soft-tissue]
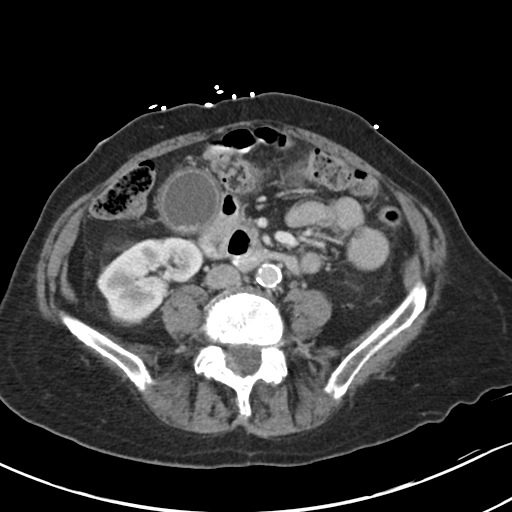
[im 46/83  soft-tissue]
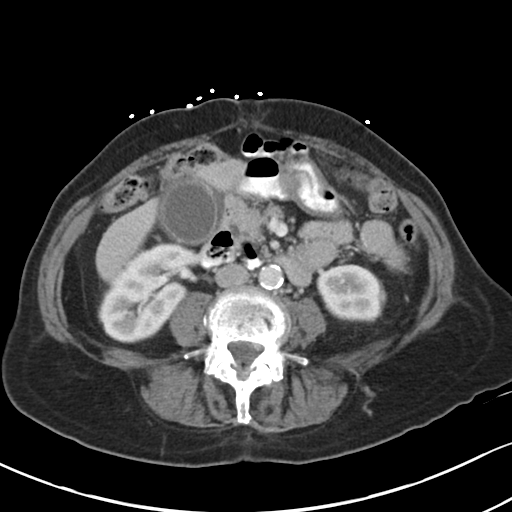
[im 55/83  soft-tissue]
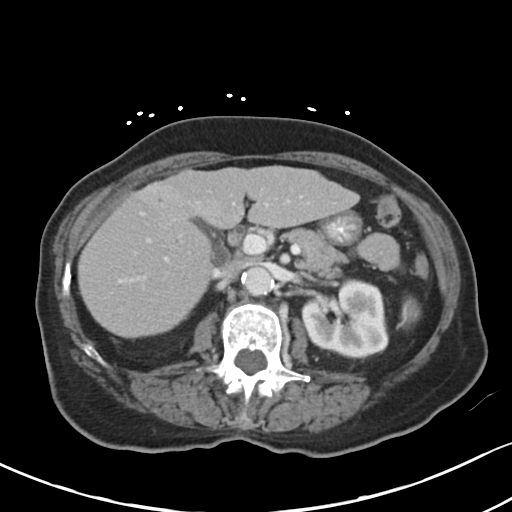
[im 55/83  bone]
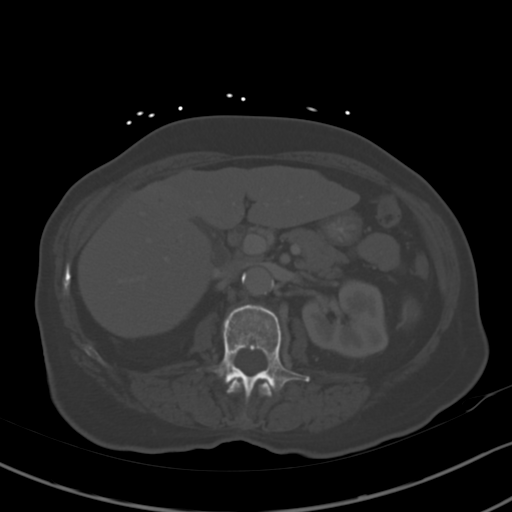
[im 60/83  soft-tissue]
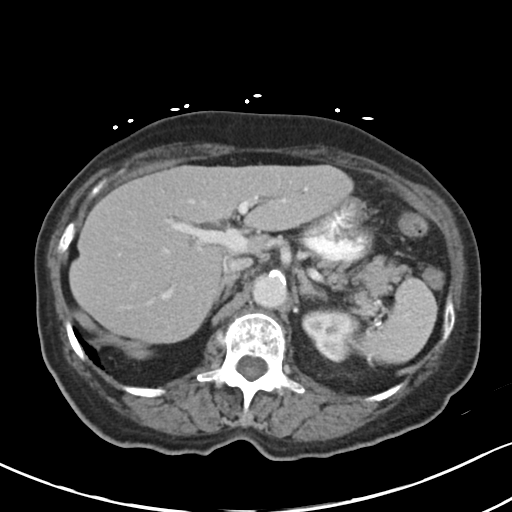
[im 64/83  soft-tissue]
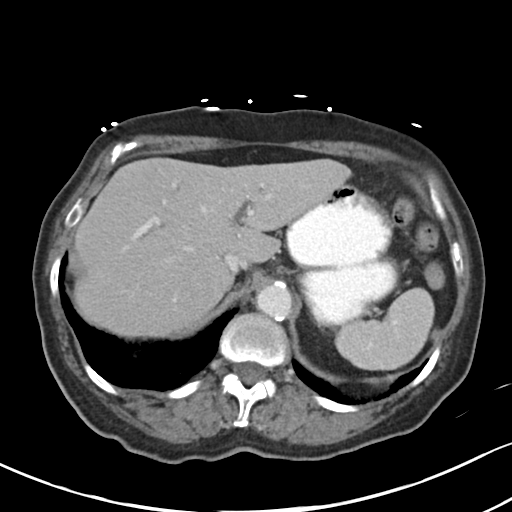
[im 73/83  soft-tissue]
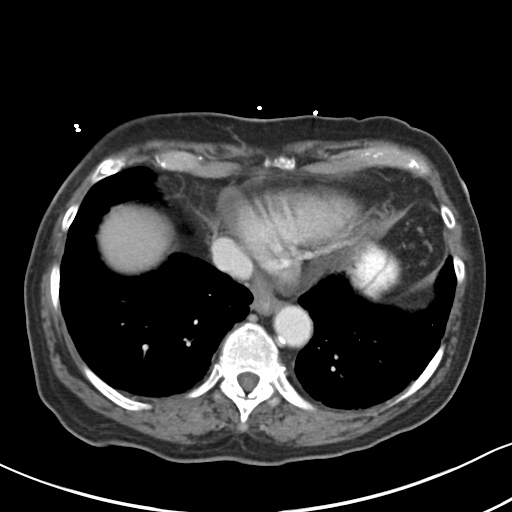
[im 78/83  soft-tissue]
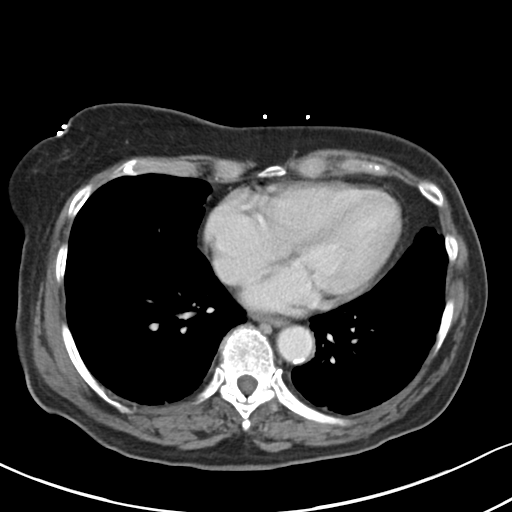

[Series 5: coronals · coronal · 0.70mm/px · 3 of 113 slices shown]
[im 38/113  soft-tissue]
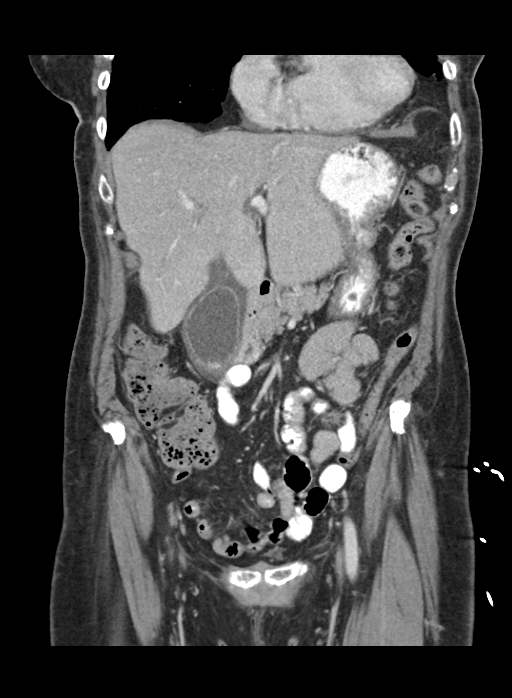
[im 50/113  soft-tissue]
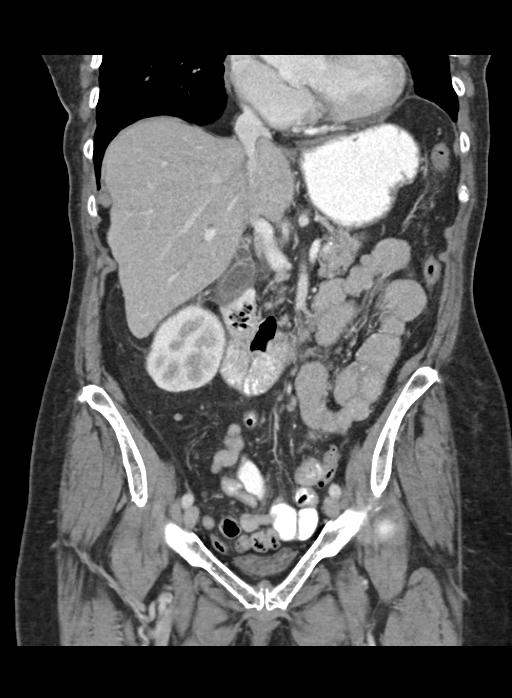
[im 63/113  soft-tissue]
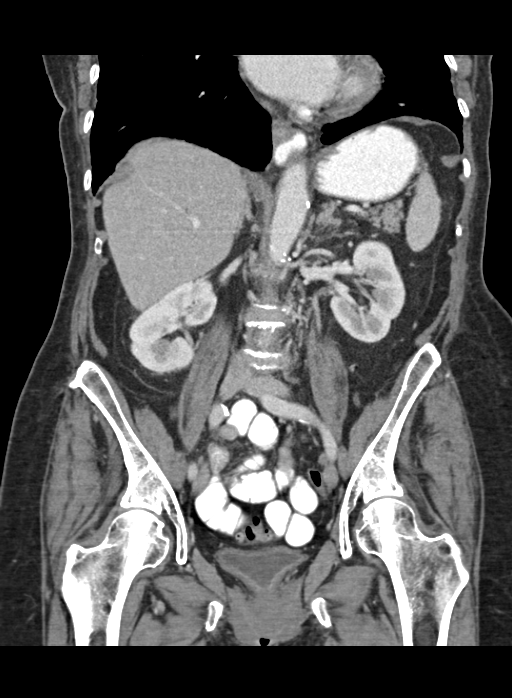

[16 of 46 positions shown; findings below may reference images not displayed]

FINDINGS: BODY WALL: Unremarkable.

LOWER CHEST: Small sliding-type hiatal hernia. There is nonspecific
reticulation in the lower lungs.

ABDOMEN/PELVIS:

Liver: The liver has a lobulated surface and there is mild widening
of the hepatic fissures. The surface nodularity is new or increased
from 8300. No focal lesion.

Biliary: Gallbladder wall thickening and pericholecystic edema.
There are stones layering within the gallbladder. The cystic duct
has avid mucosal enhancement and likely contains stones. Best seen
on reformatted imaging there is moderate suspicion for a 3 mm stone
in the distal common bile duct.

Pancreas: No ductal enlargement or inflammatory change.

Spleen: Unremarkable.

Adrenals: Unremarkable.

Kidneys and ureters: No hydronephrosis or stone.

Bladder: Unremarkable.

Reproductive: Unremarkable.

Bowel: No bowel obstruction. Distal colonic diverticulosis. No
pericecal inflammation. Small sliding-type hiatal hernia. Duodenal
diverticula are present and uninflamed.

Retroperitoneum: No mass or adenopathy.

Peritoneum: No ascites or pneumoperitoneum.

Vascular: No acute abnormality.

OSSEOUS: Severe and diffuse degenerative disc change Remote
appearing T11 compression fracture.
IMPRESSION: 1. Findings consistent with acute cholecystitis.
2. Moderate suspicion for choledocholithiasis.
3. Morphologic changes suggesting cirrhosis. Correlate for risk
factors.

## 2016-03-13 IMAGING — RF DG CHOLANGIOGRAM OPERATIVE
1 series · 10 of 10 positions shown · non-contrast
Comparison: CT scan of same day.

CLINICAL DATA: Cholecystitis.

EXAM:
INTRAOPERATIVE CHOLANGIOGRAM
TECHNIQUE: Cholangiographic images from the C-arm fluoroscopic device were
submitted for interpretation post-operatively. Please see the
procedural report for the amount of contrast and the fluoroscopy
time utilized.

[Series 1: run · 4 acquisitions, 10 frames shown]
[im 1/4]
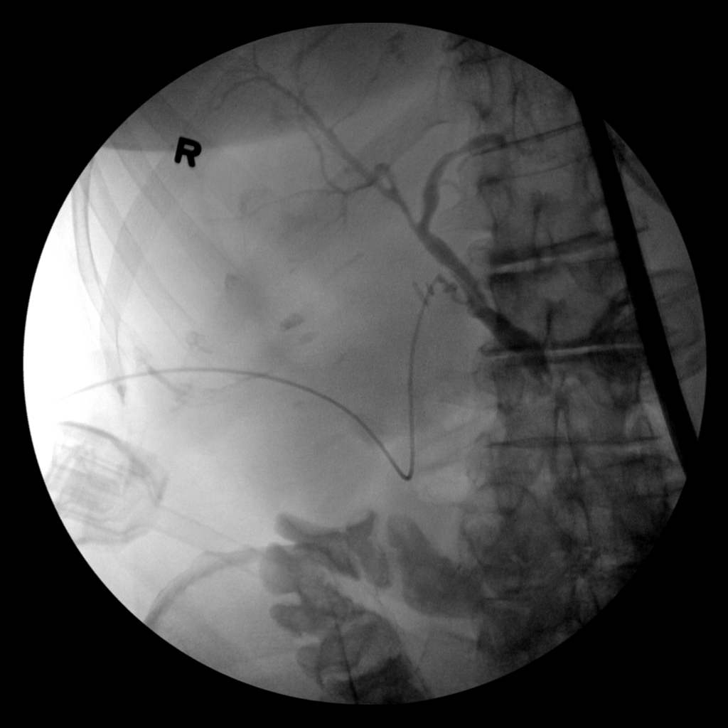
[im 1/4]
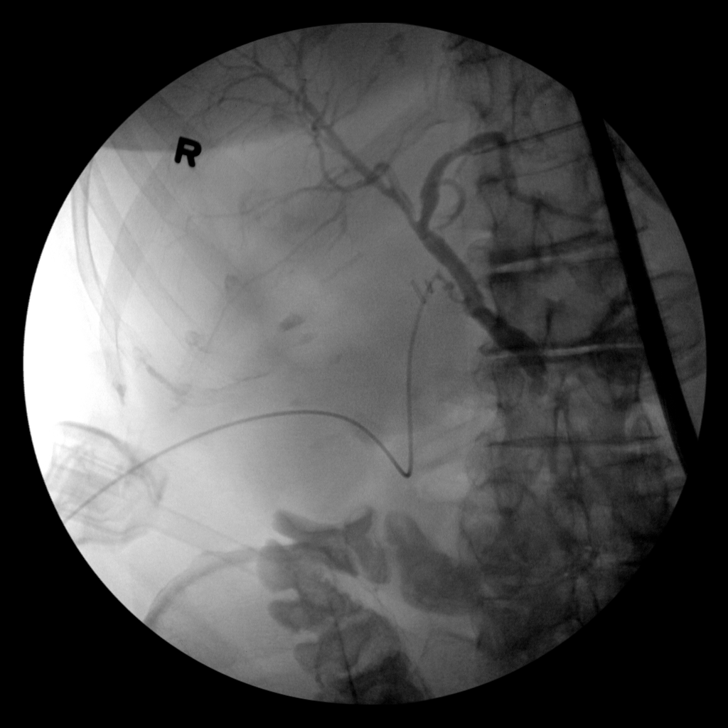
[im 1/4]
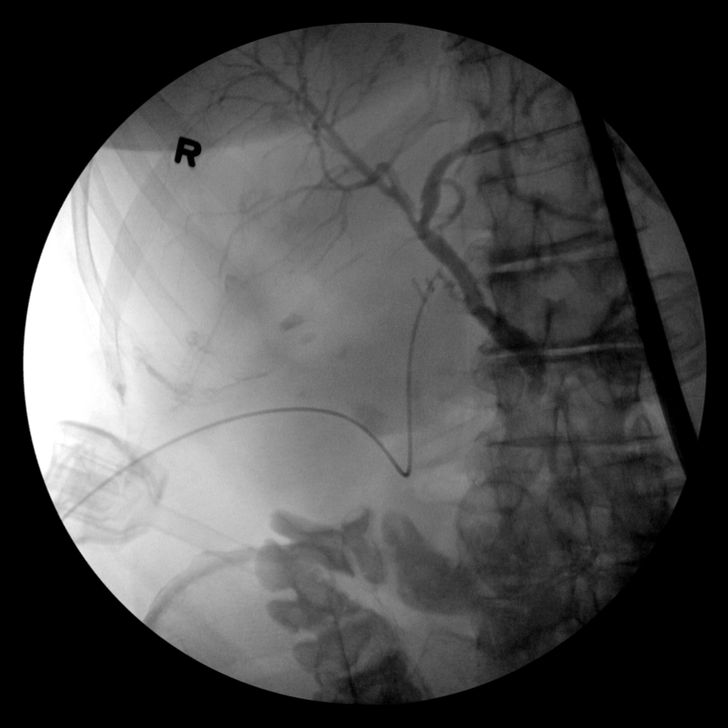
[im 1/4]
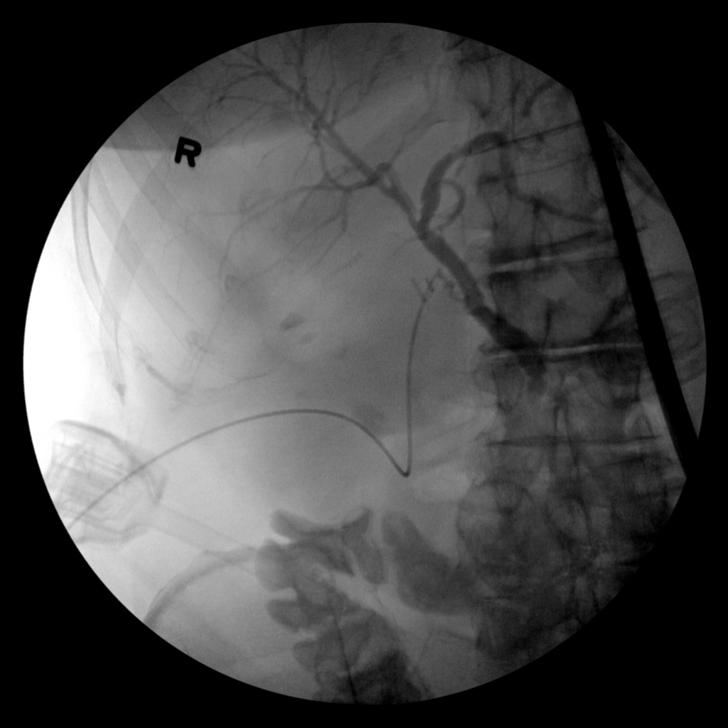
[im 2/4]
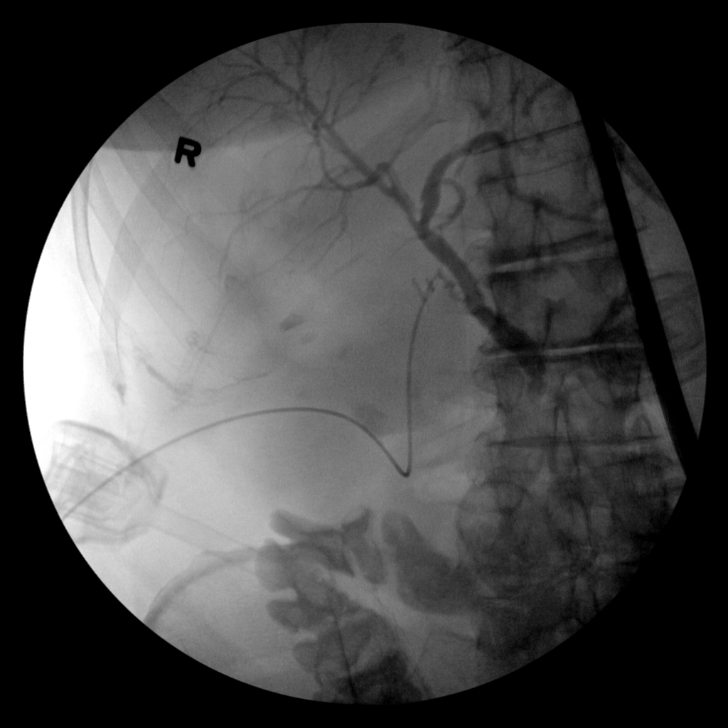
[im 3/4]
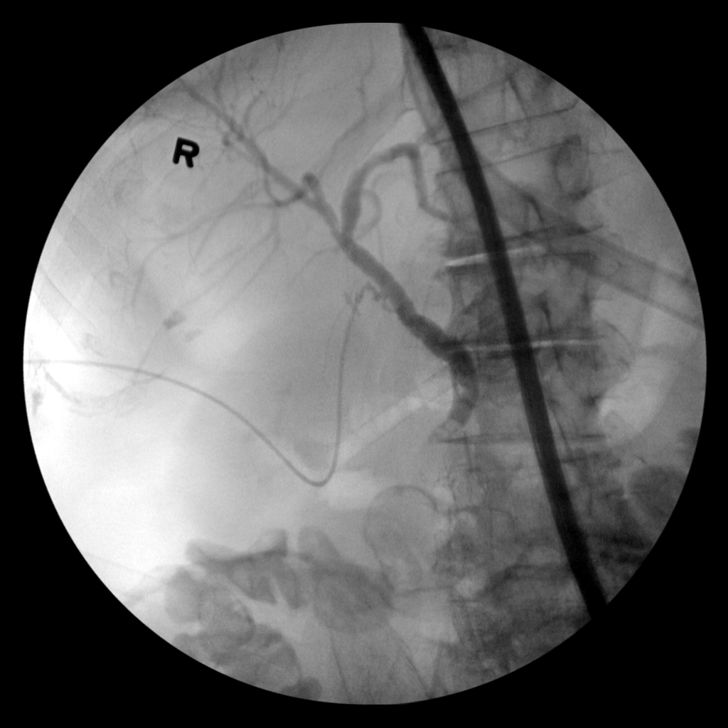
[im 3/4]
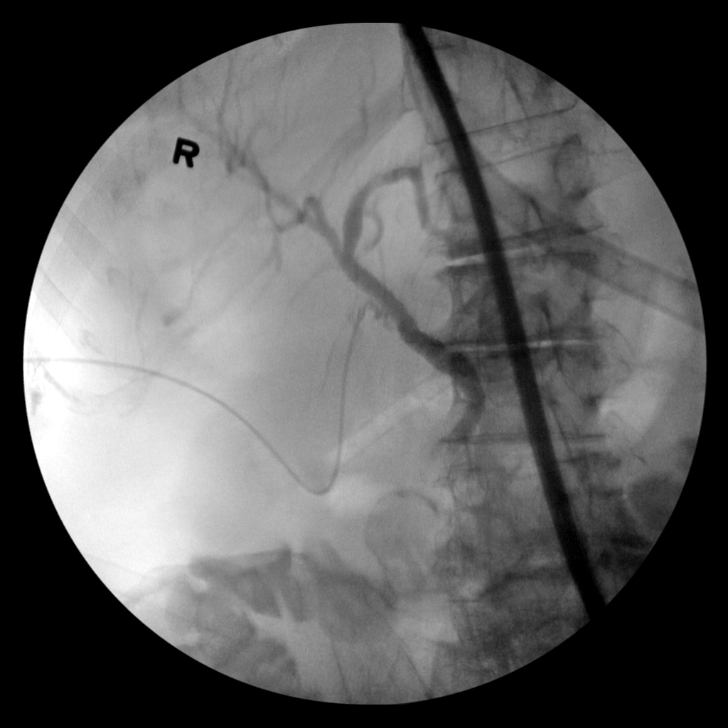
[im 3/4]
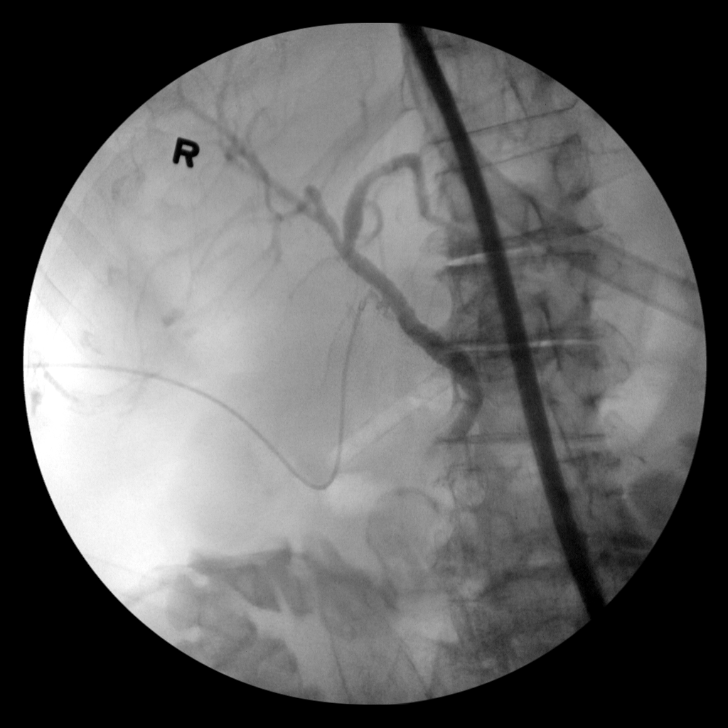
[im 3/4]
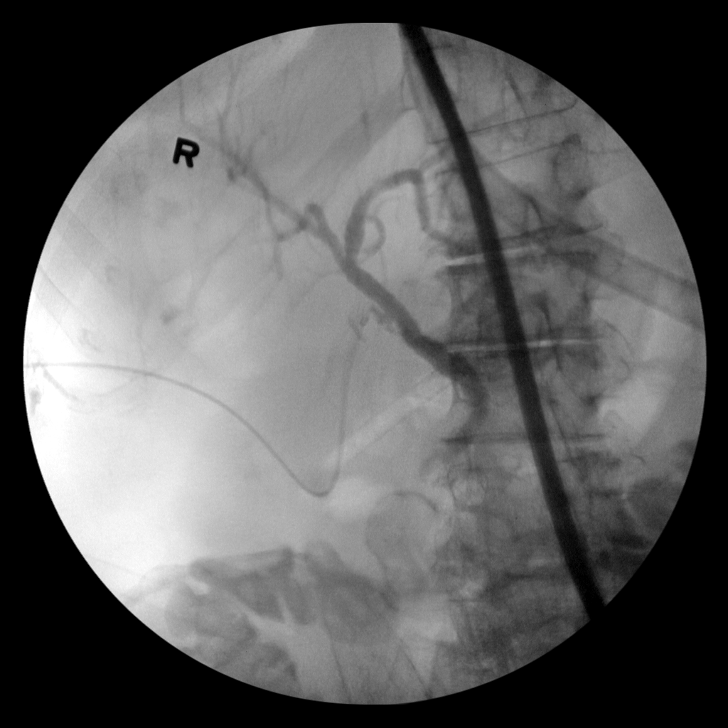
[im 4/4]
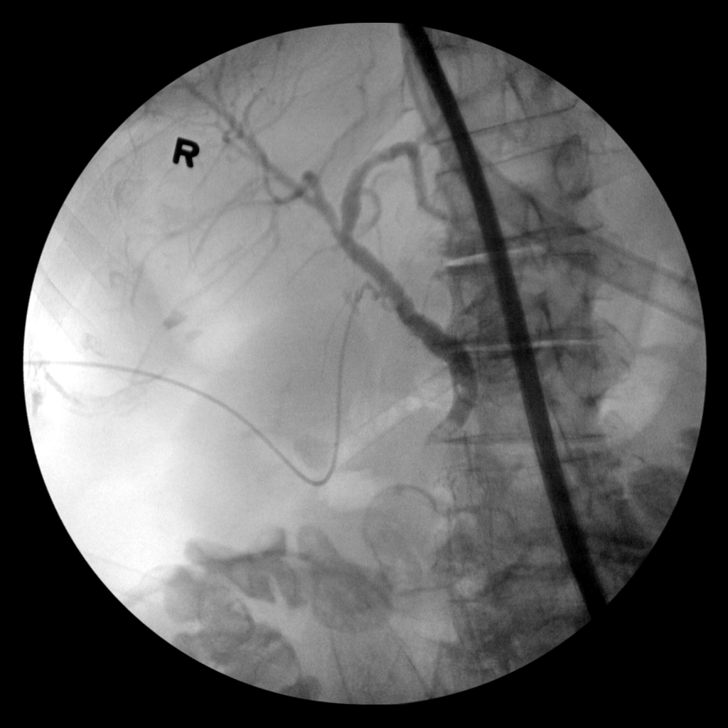

[10 of 10 positions shown; findings below may reference images not displayed]

FINDINGS: Under fluoroscopy, contrast was injected through cannulated cystic
duct remnant. Filling of the intrahepatic and extrahepatic biliary
ducts is noted. Multiple rounded filling defects are noted in the
distal common bile duct concerning for gallstones. These appear to
be completely obstructive as no filling of the duodenum is noted.
IMPRESSION: Residual stones are noted in the distal common bile duct resulting
in obstruction of the common bile duct. No filling of the duodenum
is noted.

## 2016-03-22 ENCOUNTER — Telehealth: Payer: Self-pay | Admitting: Internal Medicine

## 2016-03-22 MED ORDER — PREDNISONE 10 MG PO TABS: ORAL_TABLET | ORAL | 0 refills | Status: DC

## 2016-03-22 NOTE — Telephone Encounter (Signed)
reviewed  Record  And if  As before can refill the prednisone  If recurring I would like her to see a dermatologist .

## 2016-03-22 NOTE — Telephone Encounter (Signed)
Confirmed with the pt that no new sx and rash is the same as before.  Rx sent to the pharmacy.  Pt agreed to see dermatology if reoccurs.

## 2016-03-22 NOTE — Telephone Encounter (Signed)
Pt is itching and requesting a refill on prednisone call into [facility] [address]

## 2016-03-22 NOTE — Telephone Encounter (Signed)
Spoke to the pt.  She now has a rash under her right and left arm.  Also itching on rt forearm.  Most of the time the rash is on the trunk of her body.  Sometimes on her scalp.  Denies fever and trouble breathing..  Pt states the rash looks like "little tiny pimples."  Has no new drugs and is not using any new products.  Would like [facility]one [facility] to [facility] [address]  Please advise.  Thanks!!

## 2016-04-05 ENCOUNTER — Ambulatory Visit (INDEPENDENT_AMBULATORY_CARE_PROVIDER_SITE_OTHER): Payer: Medicare Other | Admitting: Family Medicine

## 2016-04-05 ENCOUNTER — Encounter: Payer: Self-pay | Admitting: Family Medicine

## 2016-04-05 VITALS — BP 118/78 | HR 86 | Temp 97.5°F | Wt 137.0 lb

## 2016-04-05 DIAGNOSIS — R238 Other skin changes: Secondary | ICD-10-CM | POA: Diagnosis not present

## 2016-04-05 DIAGNOSIS — W57XXXA Bitten or stung by nonvenomous insect and other nonvenomous arthropods, initial encounter: Secondary | ICD-10-CM | POA: Diagnosis not present

## 2016-04-05 NOTE — Progress Notes (Signed)
Subjective:    Patient ID: Susan Ford, female    DOB: 1930-03-06, 82 y.o.   MRN: [id number]  HPI  Susan Ford is an 82 year old female who presents today with history of a 2 tick bites. First tick bite with removal occurred 2 weeks ago.  Second tick bite with removal occurred 3 days ago. Associated mild irritation of area after tick removed which has improved. She removed the ticks and reported that the full tick was intact.  She denies that the tick was  engorged, fever, chills, sweats, myalgias, stiff neck, headache or rash.   She reports working outside and noted ticks after working in the yard; no pets.  Review of Systems  Constitutional: Negative for chills, fatigue and fever.  HENT: Negative for congestion, postnasal drip, rhinorrhea, sinus pain, sinus pressure, sneezing and sore throat.   Respiratory: Negative for cough, shortness of breath and wheezing.   Cardiovascular: Negative for chest pain and palpitations.  Gastrointestinal: Negative for abdominal pain, constipation, diarrhea, nausea and vomiting.  Skin: Negative for rash.  Neurological: Negative for dizziness, light-headedness and headaches.   Past Medical History:  Diagnosis Date  . Arthritis   . Cataract 2007   bilateral cat ext   . Cholecystitis   . Complication of anesthesia Feb 21, 2014   atrial fib after cholecystectomy, lasted a few hours, none since  . Dysrhythmia 02-21-2014   after cholecystectomy had atrial fib for a few hours, none since  . GERD (gastroesophageal reflux disease)    Had endoscopy no history of Barrett's  . Hx of nonmelanoma skin cancer   . Unspecified hereditary and idiopathic peripheral neuropathy      Social History   Social History  . Marital status: Widowed    Spouse name: Gwyndolyn Saxon  . Number of children: N/A  . Years of education: N/A   Occupational History  . Retired     Therapist, sports   Social History Main Topics  . Smoking status: Former Smoker    Packs/day: 0.25   Years: 7.00    Types: Cigarettes    Quit date: 04/27/1963  . Smokeless tobacco: Never Used  . Alcohol use 4.2 oz/week    7 Glasses of wine per week     Comment: 1 glass of wine daily  . Drug use: No  . Sexual activity: Yes   Other Topics Concern  . 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 ABDOMINAL HYSTERECTOMY  15 yrs ago   complete  . CHOLECYSTECTOMY N/A 02/21/2014   Procedure: LAPAROSCOPIC CHOLECYSTECTOMY WITH INTRAOPERATIVE CHOLANGIOGRAM;  Surgeon: Doreen Salvage, MD;  Location: [place];  Service: General;  Laterality: N/A;  . ENDOSCOPIC RETROGRADE CHOLANGIOPANCREATOGRAPHY (ERCP) WITH PROPOFOL N/A 05/29/2014   Procedure: ENDOSCOPIC RETROGRADE CHOLANGIOPANCREATOGRAPHY (ERCP) WITH PROPOFOL;  Surgeon: Arta Silence, MD;  Location: [facility];  Service: Endoscopy;  Laterality: N/A;  . ERCP N/A 02/24/2014   Procedure: ENDOSCOPIC RETROGRADE CHOLANGIOPANCREATOGRAPHY (ERCP);  Surgeon: Arta Silence, MD;  Location: [facility] ENDOSCOPY;  Service: Endoscopy;  Laterality: N/A;  . EYE SURGERY Bilateral 2007   both eyes cataracts with lens replacments  . LUMBAR LAMINECTOMY/DECOMPRESSION MICRODISCECTOMY  02/01/2012   Procedure: LUMBAR LAMINECTOMY/DECOMPRESSION MICRODISCECTOMY 2  LEVELS;  Surgeon: Kristeen Miss, MD;  Location: [place] [facility];  Service: Neurosurgery;  Laterality: Left;  Left Lumbar Four-Five Lumbar Five-Sacral One Laminectomies/Foraminotomies/Microscope  . SPYGLASS CHOLANGIOSCOPY N/A 05/29/2014   Procedure: SPYGLASS CHOLANGIOSCOPY;  Surgeon: Arta Silence, MD;  Location: [facility];  Service: Endoscopy;  Laterality: N/A;  . TONSILLECTOMY  age 55    Family History  Problem Relation Age of Onset  . Neurodegenerative disease Mother     Mikey Bussing Drager died at 88  . Alcohol abuse    . Heart disease       Allergies  Allergen Reactions  . Keflex [Cephalexin]     Dizziness  . Lyrica [Pregabalin] Other (See Comments)    "High Fever"   . Oxytetracycline Other (See Comments)    "High Fever" caused by terramycin    Current Outpatient Prescriptions on File Prior to Visit  Medication Sig Dispense Refill  . ascorbic acid (VITAMIN C) 1000 MG tablet Take 1,000 mg by mouth daily after lunch.     Marland Kitchen aspirin 81 MG tablet Take 81 mg by mouth daily after lunch.     . calcium citrate-vitamin D (CITRACAL+D) 315-200 MG-UNIT per tablet Take 1 tablet by mouth daily.     . celecoxib (CELEBREX) 200 MG capsule TAKE 1 CAPSULE (200 MG TOTAL) BY MOUTH DAILY. 90 capsule 0  . estradiol (CLIMARA - DOSED IN MG/24 HR) 0.025 mg/24hr patch PLACE 1 PATCH ONTO THE SKIN ONCE A WEEK. ON SATURDAY 8 patch 2  . famotidine (PEPCID) 20 MG tablet Take 20 mg by mouth daily.    Marland Kitchen gabapentin (NEURONTIN) 600 MG tablet TAKE 1 TABLET BY MOUTH 4 TIMES A DAY 360 tablet 1  . Multiple Vitamin (MULTIVITAMIN) tablet Take 1 tablet by mouth daily after lunch.     . simvastatin (ZOCOR) 10 MG tablet TAKE 1 TABLET (10 MG TOTAL) BY MOUTH AT BEDTIME. 90 tablet 3  . triamcinolone cream (KENALOG) 0.1 % Apply 1 application topically 2 (two) times daily. (Patient not taking: Reported on 04/05/2016) 30 g 0   No current facility-administered medications on file prior to visit.     BP 118/78 (BP Location: Left Arm, Patient Position: Sitting, Cuff Size: Normal)   Pulse 86   Temp 97.5 F (36.4 C) (Oral)   Wt 137 lb (62.1 kg)   SpO2 95%   BMI 25.47 kg/m        Objective:   Physical Exam  Constitutional: She appears well-developed and well-nourished.  Eyes: Pupils are equal, round, and reactive to light. No scleral icterus.  Neck: Neck supple.  Cardiovascular: Normal rate and regular rhythm.   Pulmonary/Chest: Effort normal and breath sounds normal. She has no wheezes. She has no rales.  Lymphadenopathy:    She has no cervical adenopathy.   Skin: Skin is warm and dry. No rash noted.  Pinpoint area of eschar on auricle of left ear and pinpoint area of eschar on inner left thigh without erythema, drainage, or warmth that are healing        Assessment & Plan:  1. Tick bite, initial encounter Healing tick bites; exam and history are reassuring; she does not meet criteria for prophylactic antibiotic therapy at this time. Provided verbal an written instructions for signs and symptoms for further evaluation if needed.  Follow up if needed;   Delano Metz, FNP-C

## 2016-04-05 NOTE — Progress Notes (Signed)
Pre visit review using our clinic review tool, if applicable. No additional management support is needed unless otherwise documented below in the visit note. 

## 2016-04-05 NOTE — Patient Instructions (Addendum)
It was a pleasure to meet you today! Please see information below for signs and symptoms to follow up for further evaluation if needed.    Lyme Disease Lyme disease is an infection that affects many parts of the body, including the skin, joints, and nervous system. It is a bacterial infection that starts from the bite of an infected tick. The infection can spread, and some of the symptoms are similar to the flu. If Lyme disease is not treated, it may cause joint pain, swelling, numbness, problems thinking, fatigue, muscle weakness, and other problems. What are the causes? This condition is caused by bacteria called Borrelia burgdorferi. You can get Lyme disease by being bitten by an infected tick. The tick must be attached to your skin to pass along the infection. Deer often carry infected ticks. What increases the risk? The following factors may make you more likely to develop this condition:  Living in or visiting these areas in the [place]  [place].  The [place].  The [place].  Spending time in wooded or grassy areas.  Being outdoors with exposed skin.  Camping, gardening, hiking, fishing, or hunting outdoors.  Failing to remove a tick from your skin within 3-4 days. What are the signs or symptoms? Symptoms of this condition include:  A round, red rash that surrounds the center of the tick bite. This is the first sign of infection. The center of the rash may be blood colored or have tiny blisters.  Fatigue.  Headache.  Chills and fever.  General achiness.  Joint pain, often in the knees.  Muscle pain.  Swollen lymph glands.  Stiff neck. How is this diagnosed? This condition is diagnosed based on:  Your symptoms and medical history.  A physical exam.  A blood test. How is this treated? The main treatment for this condition is antibiotic medicine, which is usually taken by mouth (orally). The length of treatment depends on how soon after a  tick bite you begin taking the medicine. In some cases, treatment is necessary for several weeks. If the infection is severe, antibiotics may need to be given through an IV tube that is inserted into one of your veins. Follow these instructions at home:  Take your antibiotic medicine as told by your health care provider. Do not stop taking the antibiotic even if you start to feel better.  Ask your health care provider about takinga probiotic in between doses of your antibiotic to help avoid stomach upset or diarrhea.  Check with your health care provider before supplementing your treatment. Many alternative therapies have not been proven and may be harmful to you.  Keep all follow-up visits as told by your health care provider. This is important. How is this prevented? You can become reinfected if you get another tick bite from an infected tick. Take these steps to help prevent an infection:  Cover your skin with light-colored clothing when you are outdoors in the spring and summer months.  Spray clothing and skin with bug spray. The spray should be 20-30% DEET.  Avoid wooded, grassy, and shaded areas.  Remove yard litter, brush, trash, and plants that attract deer and rodents.  Check yourself for ticks when you come indoors.  Wash clothing worn each day.  Check your pets for ticks before they come inside.  If you find a tick:  Remove it with tweezers.  Clean your hands and the bite area with rubbing alcohol or soap and water. Pregnant women should take  special care to avoid tick bites because the infection can be passed along to the fetus. Contact a health care provider if:  You have symptoms after treatment.  You have removed a tick and want to bring it to your health care provider for testing. Get help right away if:  You have an irregular heartbeat.  You have nerve pain.  Your face feels numb. This information is not intended to replace advice given to you by your  health care provider. Make sure you discuss any questions you have with your health care provider. Document Released: 07/19/2000 Document Revised: 12/02/2015 Document Reviewed: 12/02/2015 Elsevier Interactive Patient Education  2017 [employer].

## 2016-04-09 ENCOUNTER — Other Ambulatory Visit: Payer: Self-pay | Admitting: Neurology

## 2016-04-15 ENCOUNTER — Other Ambulatory Visit: Payer: Self-pay | Admitting: Internal Medicine

## 2016-05-03 ENCOUNTER — Other Ambulatory Visit: Payer: Self-pay | Admitting: Internal Medicine

## 2016-05-03 DIAGNOSIS — Z1231 Encounter for screening mammogram for malignant neoplasm of breast: Secondary | ICD-10-CM

## 2016-05-14 ENCOUNTER — Ambulatory Visit: Payer: Medicare Other | Admitting: Podiatry

## 2016-05-19 ENCOUNTER — Ambulatory Visit (INDEPENDENT_AMBULATORY_CARE_PROVIDER_SITE_OTHER): Payer: Medicare Other | Admitting: Podiatry

## 2016-05-19 ENCOUNTER — Encounter: Payer: Self-pay | Admitting: Podiatry

## 2016-05-19 VITALS — BP 137/84 | HR 87 | Resp 16 | Ht 64.0 in | Wt 134.0 lb

## 2016-05-19 DIAGNOSIS — B351 Tinea unguium: Secondary | ICD-10-CM

## 2016-05-19 DIAGNOSIS — L03031 Cellulitis of right toe: Secondary | ICD-10-CM | POA: Diagnosis not present

## 2016-05-19 NOTE — Progress Notes (Signed)
   Subjective:    Patient ID: Susan Ford, female    DOB: 04-09-1930, 83 y.o.   MRN: [id number]  HPI Chief Complaint  Patient presents with  . Nail Problem    Right foot; great toe; nail discoloration & thickened nail; pt stated, "Toe feels sore when they touch their nail; started a month ago"      Review of Systems  All other systems reviewed and are negative.      Objective:   Physical Exam        Assessment & Plan:

## 2016-05-19 NOTE — Progress Notes (Signed)
Subjective:     Patient ID: Susan Ford, female   DOB: 1929-08-30, 83 y.o.   MRN: [id number]  HPI patient presents stating her big toenail right has been thickened and dystrophic and painful over the last couple months and she does not remember injury   Review of Systems  All other systems reviewed and are negative.      Objective:   Physical Exam  Constitutional: She is oriented to person, place, and time.  Cardiovascular: Intact distal pulses.   Musculoskeletal: Normal range of motion.  Neurological: She is oriented to person, place, and time.  Skin: Skin is warm and dry.  Nursing note and vitals reviewed.  neurovascular status intact with muscle strength adequate and patient noted to have irritation of the distal right hallux with discoloration in thickness of the nailbed in the distal one third of the nail. It is mildly loose and painful when pressed     Assessment:     Damaged nailbed with possibility for mild vascular insult secondary to probable cold exposure    Plan:     H&P conditions reviewed to go ahead and reduce the inflamed tissue and I infiltrated the right hallux 60 mg like Marcaine mixture and after sterile prep I debrided and remove distal tissue and necrotic tissue medial lateral side with no active drainage noted. I did discuss that there may be a very mild soft tissue infection we'll begin soaks but I do not recommend current antibiotics. Hopefully this will heal uneventfully and she is to come back if any issues should occur

## 2016-05-21 DIAGNOSIS — L245 Irritant contact dermatitis due to other chemical products: Secondary | ICD-10-CM | POA: Diagnosis not present

## 2016-05-21 DIAGNOSIS — Z85828 Personal history of other malignant neoplasm of skin: Secondary | ICD-10-CM | POA: Diagnosis not present

## 2016-05-21 DIAGNOSIS — L308 Other specified dermatitis: Secondary | ICD-10-CM | POA: Diagnosis not present

## 2016-05-21 DIAGNOSIS — L298 Other pruritus: Secondary | ICD-10-CM | POA: Diagnosis not present

## 2016-05-31 ENCOUNTER — Ambulatory Visit
Admission: RE | Admit: 2016-05-31 | Discharge: 2016-05-31 | Disposition: A | Discharge status: Home / Self Care | Payer: Medicare Other | Source: Ambulatory Visit | Attending: Internal Medicine | Admitting: Internal Medicine

## 2016-05-31 DIAGNOSIS — Z1231 Encounter for screening mammogram for malignant neoplasm of breast: Secondary | ICD-10-CM | POA: Diagnosis not present

## 2016-06-02 ENCOUNTER — Other Ambulatory Visit: Payer: Self-pay | Admitting: Internal Medicine

## 2016-06-02 DIAGNOSIS — R928 Other abnormal and inconclusive findings on diagnostic imaging of breast: Secondary | ICD-10-CM

## 2016-06-03 ENCOUNTER — Encounter: Payer: Self-pay | Admitting: Cardiology

## 2016-06-07 ENCOUNTER — Ambulatory Visit (INDEPENDENT_AMBULATORY_CARE_PROVIDER_SITE_OTHER): Payer: Medicare Other | Admitting: Cardiology

## 2016-06-07 ENCOUNTER — Encounter: Payer: Self-pay | Admitting: Cardiology

## 2016-06-07 VITALS — BP 126/84 | HR 72 | Ht 64.0 in | Wt 135.4 lb

## 2016-06-07 DIAGNOSIS — G8929 Other chronic pain: Secondary | ICD-10-CM

## 2016-06-07 DIAGNOSIS — I4892 Unspecified atrial flutter: Secondary | ICD-10-CM | POA: Diagnosis not present

## 2016-06-07 DIAGNOSIS — M545 Low back pain, unspecified: Secondary | ICD-10-CM

## 2016-06-07 DIAGNOSIS — G609 Hereditary and idiopathic neuropathy, unspecified: Secondary | ICD-10-CM

## 2016-06-07 NOTE — Patient Instructions (Signed)
Medication Instructions:  The current medical regimen is effective;  continue present plan and medications.  Follow-Up: Follow up as needed with Dr Skains.  If you need a refill on your cardiac medications before your next appointment, please call your pharmacy.  Thank you for choosing [facility]!!     

## 2016-06-07 NOTE — Progress Notes (Signed)
Cardiology Office Note   Date:  06/07/2016   ID:  Susan Ford, DOB [birth year], MRN [id number]  PCP:  Lottie Dawson, MD  Cardiologist: Candee Furbish MD former Darlin Coco MD      History of Present Illness: Susan Ford is a 83 y.o. female who presents for One-year follow-up office visit  She is a widow.  Her husband died of amiodarone toxicity.  She is a retired Equities trader.  She worked in Teacher, English as a foreign language. I had seen her during A previous hospitalization for cholelithiasis. While hospitalized she had brief atrial flutter which occurred postoperatively and resolved while she was in the hospital. She has no prior history of any atrial arrhythmias. She did not require anticoagulation at discharge. She had a subsequent outpatient 30 day monitor which did not show any recurrent atrial flutter or fibrillation. She remains in normal sinus rhythm. She denies any chest pain or shortness of breath. Stopped low dose diltiazem CD 120 mg 2017.     She reports that she has had no arrhythmias over the past year.  She has been feeling well.  No chest pain.  No palpitations.  She is troubled with low back pain secondary to 2 bulging disks.  She has seen Dr. Ellene Route.  She has had surgery and has continued to have difficulty.  Dr. Ellene Route has offered her a much or extensive operation which would last 7 hours and she has declined that.  She has received  injections by Dr. Ernestina Patches.  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 Dr. Krista Blue. Her leg neuropathy seems to be her main complaint.  She spent some time telling me about her husband who died at age 44 with amiodarone toxicity.   Past Medical History:  Diagnosis Date  . Arthritis   . Cataract 2007   bilateral cat ext   . Cholecystitis   . Complication of anesthesia Feb 21, 2014   atrial fib after cholecystectomy, lasted a few hours, none since  . Dysrhythmia 02-21-2014   after cholecystectomy had  atrial fib for a few hours, none since  . GERD (gastroesophageal reflux disease)    Had endoscopy no history of Barrett's  . Hx of nonmelanoma skin cancer   . Unspecified hereditary and idiopathic peripheral neuropathy     Past Surgical History:  Procedure Laterality Date  . ABDOMINAL HYSTERECTOMY  15 yrs ago   complete  . BREAST CYST ASPIRATION    . CHOLECYSTECTOMY N/A 02/21/2014   Procedure: LAPAROSCOPIC CHOLECYSTECTOMY WITH INTRAOPERATIVE CHOLANGIOGRAM;  Surgeon: Doreen Salvage, MD;  Location: [facility];  Service: General;  Laterality: N/A;  . ENDOSCOPIC RETROGRADE CHOLANGIOPANCREATOGRAPHY (ERCP) WITH PROPOFOL N/A 05/29/2014   Procedure: ENDOSCOPIC RETROGRADE CHOLANGIOPANCREATOGRAPHY (ERCP) WITH PROPOFOL;  Surgeon: Arta Silence, MD;  Location: [facility];  Service: Endoscopy;  Laterality: N/A;  . ERCP N/A 02/24/2014   Procedure: ENDOSCOPIC RETROGRADE CHOLANGIOPANCREATOGRAPHY (ERCP);  Surgeon: Arta Silence, MD;  [facility] ENDOSCOPY;  Service: Endoscopy;  Laterality: N/A;  . EYE SURGERY Bilateral 2007   both eyes cataracts with lens replacments  . LUMBAR LAMINECTOMY/DECOMPRESSION MICRODISCECTOMY  02/01/2012   Procedure: LUMBAR LAMINECTOMY/DECOMPRESSION MICRODISCECTOMY 2 LEVELS;  Surgeon: Kristeen Miss, MD;  Location: [facility];  Service: Neurosurgery;  Laterality: Left;  Left Lumbar Four-Five Lumbar Five-Sacral One Laminectomies/Foraminotomies/Microscope  . SPYGLASS CHOLANGIOSCOPY N/A 05/29/2014   Procedure: SPYGLASS CHOLANGIOSCOPY;  Surgeon: Arta Silence, MD;  Location: [facility] ENDOSCOPY;  Service: Endoscopy;  Laterality: N/A;  . TONSILLECTOMY  age 51  Current Outpatient Prescriptions  Medication Sig Dispense Refill  . ascorbic acid (VITAMIN C) 1000 MG tablet Take 1,000 mg by mouth daily after lunch.     Marland Kitchen aspirin 81 MG tablet Take 81 mg by mouth daily after lunch.     . calcium citrate-vitamin D (CITRACAL+D) 315-200 MG-UNIT per tablet Take 1 tablet by mouth daily.     . celecoxib  (CELEBREX) 200 MG capsule TAKE 1 CAPSULE (200 MG TOTAL) BY MOUTH DAILY. 90 capsule 0  . estradiol (CLIMARA - DOSED IN MG/24 HR) 0.025 mg/24hr patch PLACE 1 PATCH ONTO THE SKIN ONCE A WEEK. ON SATURDAY 8 patch 2  . famotidine (PEPCID) 20 MG tablet Take 20 mg by mouth daily.    Marland Kitchen gabapentin (NEURONTIN) 600 MG tablet TAKE 1 TABLET BY MOUTH 4 TIMES A DAY 360 tablet 1  . Multiple Vitamin (MULTIVITAMIN) tablet Take 1 tablet by mouth daily after lunch.     . simvastatin (ZOCOR) 10 MG tablet TAKE 1 TABLET (10 MG TOTAL) BY MOUTH AT BEDTIME. 90 tablet 3   No current facility-administered medications for this visit.     Allergies:   Keflex [cephalexin]; Lyrica [pregabalin]; and Oxytetracycline    Social History:  The patient  reports that she quit smoking about 53 years ago. Her smoking use included Cigarettes. She has a 1.75 pack-year smoking history. She has never used smokeless tobacco. She reports that she drinks about 4.2 oz of alcohol per week . She reports that she does not use drugs.   Family History:  The patient's family history includes Neurodegenerative disease in her mother.    ROS:  Please see the history of present illness.   Otherwise, review of systems are positive for none.   All other systems are reviewed and negative.    PHYSICAL EXAM: VS:  BP 126/84   Pulse 72   Ht 5\' 4"  (1.626 m)   Wt 135 lb 6.4 oz (61.4 kg)   BMI 23.24 kg/m  , BMI Body mass index is 23.24 kg/m. GEN: Well nourished, well developed, in no acute distress  HEENT: normal  Neck: no JVD, carotid bruits, or masses Cardiac: RRR; no murmurs, rubs, or gallops,no edema  Respiratory:  clear to auscultation bilaterally, normal work of breathing GI: soft, nontender, nondistended, + BS MS: no deformity or atrophy  Skin: warm and dry, no rash Neuro:  Strength and sensation are intact Psych: euthymic mood, full affect   EKG:  EKG is ordered today. The ekg ordered today demonstrates Normal sinus rhythm at 72 bpm.   No ischemic changes.Prior EKG showed sinus rhythm with  Incomplete right bundle branch block.  Since prior tracing of 05/06/14, heart rate is slower.   Recent Labs: 02/18/2016: ALT 14; BUN 10; Creatinine, Ser 0.61; Hemoglobin 12.8; Platelets 218.0; Potassium 4.4; Sodium 136; TSH 0.81    Lipid Panel    Component Value Date/Time   CHOL 186 02/18/2016 1126   TRIG 123.0 02/18/2016 1126   HDL 80.00 02/18/2016 1126   CHOLHDL 2 02/18/2016 1126   VLDL 24.6 02/18/2016 1126   LDLCALC 81 02/18/2016 1126   LDLDIRECT 153 02/12/2009      Wt Readings from Last 3 Encounters:  06/07/16 135 lb 6.4 oz (61.4 kg)  05/19/16 134 lb (60.8 kg)  04/05/16 137 lb (62.1 kg)         ASSESSMENT AND PLAN:  1. Perioperative atrial flutter, resolved. No recurrent arrhythmias over the past few years. 2. Severe idiopathic peripheral neuropathy, on gabapentin.  Dr. Krista Blue 3. Sinus tachycardia,Resolved 4. Severe low back pain followed by Dr. Ellene Route.   I'm comfortable at this point letting her come back on an as-needed basis. She is doing great him a cardiac perspective.      Current medicines are reviewed at length with the patient today.  The patient has concerns regarding medicines.  The following changes have been made:  She may stop her diltiazem CD now.  She will monitor her blood pressure at home.  If her blood pressure rises, she may need to go back on diltiazem or another antihypertensive.  Labs/ tests ordered today include:   Orders Placed This Encounter  Procedures  . EKG 12-Lead     Disposition:   Return in one year for follow-up office visit and EKG with Dr. Marlou Porch.  Signed, Candee Furbish, MD 06/07/2016 2:50 PM    [facility] [address], [place], [place]  [address] Phone: [phone]; Fax: [phone]

## 2016-06-11 ENCOUNTER — Ambulatory Visit
Admission: RE | Admit: 2016-06-11 | Discharge: 2016-06-11 | Disposition: A | Discharge status: Home / Self Care | Payer: Medicare Other | Source: Ambulatory Visit | Attending: Internal Medicine | Admitting: Internal Medicine

## 2016-06-11 DIAGNOSIS — N6002 Solitary cyst of left breast: Secondary | ICD-10-CM | POA: Diagnosis not present

## 2016-06-11 DIAGNOSIS — R928 Other abnormal and inconclusive findings on diagnostic imaging of breast: Secondary | ICD-10-CM

## 2016-06-14 DIAGNOSIS — Z85828 Personal history of other malignant neoplasm of skin: Secondary | ICD-10-CM | POA: Diagnosis not present

## 2016-06-14 DIAGNOSIS — L298 Other pruritus: Secondary | ICD-10-CM | POA: Diagnosis not present

## 2016-06-14 DIAGNOSIS — L308 Other specified dermatitis: Secondary | ICD-10-CM | POA: Diagnosis not present

## 2016-06-23 DIAGNOSIS — Z6825 Body mass index (BMI) 25.0-25.9, adult: Secondary | ICD-10-CM | POA: Diagnosis not present

## 2016-06-23 DIAGNOSIS — M545 Low back pain: Secondary | ICD-10-CM | POA: Diagnosis not present

## 2016-06-23 DIAGNOSIS — R030 Elevated blood-pressure reading, without diagnosis of hypertension: Secondary | ICD-10-CM | POA: Diagnosis not present

## 2016-06-24 ENCOUNTER — Telehealth: Payer: Self-pay | Admitting: Internal Medicine

## 2016-06-24 ENCOUNTER — Telehealth: Payer: Self-pay

## 2016-06-24 NOTE — Telephone Encounter (Signed)
Received PA request from [facility] for Estradiol. PA submitted & is pending. Key: [id number]

## 2016-06-24 NOTE — Telephone Encounter (Signed)
Pt picked up refill today for celecoxib (CELEBREX) 200 MG capsule  This refill was from 04/15/2017  30 w/ 5 refills  Pt picked up 30 tabs today, but would like to cancel the remaining 5 refills and change her script to a 90 day.  Pt will need rx 07/25/2016.  [id number] - [place], [place].

## 2016-06-25 ENCOUNTER — Telehealth: Payer: Self-pay | Admitting: Internal Medicine

## 2016-06-25 DIAGNOSIS — M545 Low back pain, unspecified: Secondary | ICD-10-CM

## 2016-06-25 NOTE — Telephone Encounter (Signed)
Pt need new Rx for estradiol   Pharm:  [place]

## 2016-06-28 MED ORDER — ESTRADIOL 0.025 MG/24HR TD PTWK: MEDICATED_PATCH | TRANSDERMAL | 4 refills | Status: DC

## 2016-06-28 NOTE — Telephone Encounter (Signed)
Noted  

## 2016-06-28 NOTE — Telephone Encounter (Signed)
Refill sent in to pharmacy 

## 2016-06-28 NOTE — Telephone Encounter (Signed)
It is okay to refill 

## 2016-06-28 NOTE — Telephone Encounter (Signed)
Ok to refill through October  2018

## 2016-06-28 NOTE — Telephone Encounter (Signed)
PA approved, form faxed back to pharmacy. 

## 2016-06-29 ENCOUNTER — Ambulatory Visit (INDEPENDENT_AMBULATORY_CARE_PROVIDER_SITE_OTHER): Payer: Medicare Other | Admitting: Neurology

## 2016-06-29 ENCOUNTER — Encounter: Payer: Self-pay | Admitting: Neurology

## 2016-06-29 VITALS — BP 132/72 | HR 83 | Ht 64.0 in | Wt 136.0 lb

## 2016-06-29 DIAGNOSIS — R269 Unspecified abnormalities of gait and mobility: Secondary | ICD-10-CM

## 2016-06-29 DIAGNOSIS — G8929 Other chronic pain: Secondary | ICD-10-CM | POA: Insufficient documentation

## 2016-06-29 DIAGNOSIS — M545 Low back pain, unspecified: Secondary | ICD-10-CM

## 2016-06-29 MED ORDER — GABAPENTIN 600 MG PO TABS: 600.0000 mg | ORAL_TABLET | Freq: Four times a day (QID) | ORAL | 4 refills | Status: DC

## 2016-06-29 NOTE — Progress Notes (Addendum)
[facility]  PATIENT: Susan Ford DOB: 10/10/1929   REASON FOR VISIT: Follow-up for peripheral neuropathy, spinal stenosis lumbar region abnormality of gait HISTORY FROM: Patient    HISTORY OF PRESENT ILLNESS:Susan Ford is a 83 year-old right-handed white female,She was a patient of Dr. Erling Cruz, last visit was May 2014   She had a history of lumbar decompression surgery, presenting with chronic low back pain, mild gait difficulty due to pain.  MRI of the lumbar spine 12/25/2010 shows diffuse spondylitic changes and progression of degenerative changes at L3-4 and L4-5 and modest broad disc bulge at L5-S1. There is evidence of neuroforaminal stenosis on the left at L4-5. She has pain in her feet and aching from her knees down.  EMG/NCV 12/24/2011 showing progression of peripheral neuropathy versus a prior study 09/13/2011. There was also a mild overlying acute L5 radiculopathy on the left. She underwent surgical decompression at L4-5 and L5-S1 with bilateral foraminotomies and microdiscectomy on the left at L4-5 02/01/2012 by Dr. Ellene Route, Surgery helped her. She still complains of pain at low back pain, waist line, going down her leg, at both leg.  She denies bowel/bladder incontinence  She has difficulty walking, because of low back pain, worst pain in the morning, she usually take her hydrocodone early morning, then napping for a while, then she can function again.  Previously she has tried Lyrica which caused abdominal cramps, Cymbalta, caused nausea. She had side effects from nortriptyline 20 mg per night. She is on gabapentin 600, 1-1/2 tablets 4 times per day and complains of memory loss on the medication and now questions any improvement in pain. She uses a cane, but not in her house.   She underwent an epidural steroid injection 10/2011 with improvement in her back pain.   She is independent in activities of daily living including driving a car,  cooking,cleaning, shopping, and doing laundry.   UPDATE April 20th 2016:YY Last epidural injection was in 2015, she still has a lot of low back pain, she was put on nortriptyline 25 mg qhs, neurontin 662m qid, hydrocodone prn, celebrex 2049mdaily.  We have reviewed MRI lumbar in Feb 2016: Multilevel degenerative disc disease, with mild spinal stenosis, facet arthropathy, mild to moderate foraminal stenosis at different level She complains of constant low back pian, bilatera legs achy pain, triggered by standing for just 10 minutes, she has to sit down resting after standing or walking for short period of time  She is taking Gabapentin 600 mg 4 times a day, Celexa 200 mg every day, recently started nortriptyline 25 mg every night hydrocodone as needed previously tried Cymbalta, without helping her symptoms, untolerable side effect  UPDATE Jan 14 2015:  She is alone at visit, she still takes celebrex 20034mid, Neurontin 600 mg 4 times a day, she could stand for 10 mintes, then has to sit down because of her low back pain, she also complains of intermittent bilateral lower extremity deep achy pain, She rarely needs narcotics now, denies bowel and bladder incontinence, no bilateral lower extremity numbness  UPDATE Sept 6th 2017: Last clinical visit was with CarHoyle Sauer June 2017, she is concerned about her memory loss, Laboratory evaluation in June 2017 showed normal TSH, B12, ESR, negative RPR, mild elevated glucose 109 on BMP, I have personally reviewed MRI of the brain in July 2017, moderate generalized atrophy, mild supratentorium small vessel disease,  She lives alone, still drives, but reported she got lost sometimes, today's Mini-Mental status  examination 27 out of 30  UPDATE June 29 2016: She is alone at today's clinical visit, we have personally reviewed MRI of the brain, moderate generalized atrophy, small supratentorium small vessel disease, today she denies significant memory loss,  lives alone at her house, still driving,  Her low back pain has improved, stay at low back, denies radiating pain.  MRI of lumbar spine demonstrate multilevel degenerative changes, moderate bilateral foraminal stenosis at L5-S1, moderate stenosis at L 4-5, prior left L5 laminectomy  REVIEW OF SYSTEMS: Full 14 system review of systems performed and notable only for those listed, all others are neg:  Memory loss   ALLERGIES: Allergies  Allergen Reactions  . Keflex [Cephalexin]     Dizziness  . Lyrica [Pregabalin] Other (See Comments)    "High Fever"   . Oxytetracycline Other (See Comments)    "High Fever" caused by terramycin  . Memantine Rash    HOME MEDICATIONS: Outpatient Medications Prior to Visit  Medication Sig Dispense Refill  . ascorbic acid (VITAMIN C) 1000 MG tablet Take 1,000 mg by mouth daily after lunch.     Marland Kitchen aspirin 81 MG tablet Take 81 mg by mouth daily after lunch.     . calcium citrate-vitamin D (CITRACAL+D) 315-200 MG-UNIT per tablet Take 1 tablet by mouth daily.     . celecoxib (CELEBREX) 200 MG capsule TAKE 1 CAPSULE (200 MG TOTAL) BY MOUTH DAILY. 90 capsule 0  . estradiol (CLIMARA - DOSED IN MG/24 HR) 0.025 mg/24hr patch PLACE 1 PATCH ONTO THE SKIN ONCE A WEEK. ON SATURDAY 8 patch 4  . famotidine (PEPCID) 20 MG tablet Take 20 mg by mouth daily.    Marland Kitchen gabapentin (NEURONTIN) 600 MG tablet TAKE 1 TABLET BY MOUTH 4 TIMES A DAY 360 tablet 1  . Multiple Vitamin (MULTIVITAMIN) tablet Take 1 tablet by mouth daily after lunch.     . simvastatin (ZOCOR) 10 MG tablet TAKE 1 TABLET (10 MG TOTAL) BY MOUTH AT BEDTIME. 90 tablet 3   No facility-administered medications prior to visit.     PAST MEDICAL HISTORY: Past Medical History:  Diagnosis Date  . Arthritis   . Cataract 2007   bilateral cat ext   . Cholecystitis   . Complication of anesthesia Feb 21, 2014   atrial fib after cholecystectomy, lasted a few hours, none since  . Dysrhythmia 02-21-2014   after  cholecystectomy had atrial fib for a few hours, none since  . GERD (gastroesophageal reflux disease)    Had endoscopy no history of Barrett's  . Hx of nonmelanoma skin cancer   . Unspecified hereditary and idiopathic peripheral neuropathy     PAST SURGICAL HISTORY: Past Surgical History:  Procedure Laterality Date  . ABDOMINAL HYSTERECTOMY  15 yrs ago   complete  . BREAST CYST ASPIRATION    . CHOLECYSTECTOMY N/A 02/21/2014   Procedure: LAPAROSCOPIC CHOLECYSTECTOMY WITH INTRAOPERATIVE CHOLANGIOGRAM;  Surgeon: Doreen Salvage, MD;  Location: [facility];  Service: General;  Laterality: N/A;  . ENDOSCOPIC RETROGRADE CHOLANGIOPANCREATOGRAPHY (ERCP) WITH PROPOFOL N/A 05/29/2014   Procedure: ENDOSCOPIC RETROGRADE CHOLANGIOPANCREATOGRAPHY (ERCP) WITH PROPOFOL;  Surgeon: Arta Silence, MD;  Location: [facility];  Service: Endoscopy;  Laterality: N/A;  . ERCP N/A 02/24/2014   Procedure: ENDOSCOPIC RETROGRADE CHOLANGIOPANCREATOGRAPHY (ERCP);  Surgeon: Arta Silence, MD;  Location: [facility];  Service: Endoscopy;  Laterality: N/A;  . EYE SURGERY Bilateral 2007   both eyes cataracts with lens replacments  . LUMBAR LAMINECTOMY/DECOMPRESSION MICRODISCECTOMY  02/01/2012   Procedure: LUMBAR LAMINECTOMY/DECOMPRESSION MICRODISCECTOMY 2  LEVELS;  Surgeon: Kristeen Miss, MD;  Location: [facility];  Service: Neurosurgery;  Laterality: Left;  Left Lumbar Four-Five Lumbar Five-Sacral One Laminectomies/Foraminotomies/Microscope  . SPYGLASS CHOLANGIOSCOPY N/A 05/29/2014   Procedure: SPYGLASS CHOLANGIOSCOPY;  Surgeon: Arta Silence, MD;  Location: [facility];  Service: Endoscopy;  Laterality: N/A;  . TONSILLECTOMY  age 26    FAMILY HISTORY: Family History  Problem Relation Age of Onset  . Neurodegenerative disease Mother     Mikey Bussing Drager died at 68  . Alcohol abuse    . Heart disease      SOCIAL HISTORY: Social History   Social History  . Marital status: Widowed    Spouse name: Gwyndolyn Saxon  . Number of children:  N/A  . Years of education: N/A   Occupational History  . Retired     Therapist, sports   Social History Main Topics  . Smoking status: Former Smoker    Packs/day: 0.25    Years: 7.00    Types: Cigarettes    Quit date: 04/27/1963  . Smokeless tobacco: Never Used  . Alcohol use 4.2 oz/week    7 Glasses of wine per week     Comment: 1 glass of wine daily  . Drug use: No  . Sexual activity: Yes   Other Topics Concern  . Not on file   Social History Narrative   Retired Therapist, sports    widowed since    Spring  15    [facility] of 2    Regular exercise former smoker   G3P3   From [place] originally            PHYSICAL EXAM  Vitals:   06/29/16 1126  BP: 132/72  Pulse: 83  Weight: 136 lb (61.7 kg)  Height: '5\' 4"'  (1.626 m)   Body mass index is 23.34 kg/m. Gen: NAD, conversant, well nourised, obese, well groomed  Cardiovascular: Regular rate rhythm,  Neck: Supple, no carotid bruit. NEUROLOGICAL EXAM:  MENTAL STATUS: Speech:    Speech is normal; fluent and spontaneous with normal comprehension.  Cognition: Mini-Mental Status Examination 30/30, animal naming 12     Orientation to time, place and person,      Normal Recent and remote memory:       Normal Attention span and concentration     Normal Language, naming, repeating,spontaneous speech     Fund of knowledge   CRANIAL NERVES: CN II: Visual fields are full to confrontation. Pupil equal round reactive to light CN III, IV, VI: extraocular movement are normal. No ptosis. CN V: Facial sensation is intact to pinprick in all 3 divisions bilaterally.   CN VII: Face is symmetric with normal eye closure and smile. CN VIII: Hearing is normal to rubbing fingers CN IX, X: Palate elevates symmetrically. Phonation is normal. CN XI: Head turning and shoulder shrug are intact CN XII: Tongue is midline with normal movements and no atrophy. Motor: normal bulk and tone, full strength in the BUE, BLE, fine finger movements normal, no  pronator drift. No focal weakness Sensory: normal and symmetric to light touch, pinprick, and  Vibration,   Coordination: finger-nose-finger, heel-to-shin bilaterally, no dysmetria Reflexes: Symmetric upper and lower plantar responses were flexor bilaterally. Gait and Station: Mild antalgic cautious gait, no assistive device, Romberg absent  DIAGNOSTIC DATA (LABS, IMAGING, TESTING) - I reviewed patient records, labs, notes, testing and imaging myself where available.  Lab Results  Component Value Date   WBC 6.5 02/18/2016   HGB 12.8 02/18/2016   HCT 38.5  02/18/2016   MCV 93.8 02/18/2016   PLT 218.0 02/18/2016      Component Value Date/Time   NA 136 02/18/2016 1126   NA 134 10/15/2015 1432   K 4.4 02/18/2016 1126   CL 102 02/18/2016 1126   CO2 26 02/18/2016 1126   GLUCOSE 87 02/18/2016 1126   BUN 10 02/18/2016 1126   BUN 12 10/15/2015 1432   CREATININE 0.61 02/18/2016 1126   CALCIUM 9.0 02/18/2016 1126   PROT 6.3 02/18/2016 1126   ALBUMIN 3.9 02/18/2016 1126   AST 15 02/18/2016 1126   ALT 14 02/18/2016 1126   ALKPHOS 55 02/18/2016 1126   BILITOT 0.7 02/18/2016 1126   GFRNONAA 81 10/15/2015 1432   GFRAA 94 10/15/2015 1432   Lab Results  Component Value Date   CHOL 186 02/18/2016   HDL 80.00 02/18/2016   LDLCALC 81 02/18/2016   LDLDIRECT 153 02/12/2009   TRIG 123.0 02/18/2016   CHOLHDL 2 02/18/2016    Lab Results  Component Value Date   SJGGEZMO29 476 10/15/2015   Lab Results  Component Value Date   TSH 0.81 02/18/2016      ASSESSMENT AND PLAN Susan Ford is a 83y.o. female Mild cognitive impairment  Mini-Mental Status Examination was 27/30    Evidence of at least moderate generalized atrophy, mild supratentorium small vessel disease  I have encouraged her continue have moderate exercise,  Keep daily aspirin  She could not tolerate Namenda 10 mg twice a day  Follow-up appointment with her child in 6 months,  Chronic low back pain, lumbar  radiculopathy, mild gait abnormality  She has been on gabapentin 600 mg 4 times a day , I have encourage her to cut back to lower dose decreased to side effect, refilled prescription today  Marcial Pacas, M.D. Ph.D.  [facility] [place], [place] [address] Phone: [phone] Fax:      [phone]  CC: Burnis Medin, MD

## 2016-07-02 DIAGNOSIS — M4726 Other spondylosis with radiculopathy, lumbar region: Secondary | ICD-10-CM | POA: Diagnosis not present

## 2016-07-02 DIAGNOSIS — M48061 Spinal stenosis, lumbar region without neurogenic claudication: Secondary | ICD-10-CM | POA: Diagnosis not present

## 2016-07-02 DIAGNOSIS — M5136 Other intervertebral disc degeneration, lumbar region: Secondary | ICD-10-CM | POA: Diagnosis not present

## 2016-07-02 DIAGNOSIS — M5416 Radiculopathy, lumbar region: Secondary | ICD-10-CM | POA: Diagnosis not present

## 2016-07-05 DIAGNOSIS — Z961 Presence of intraocular lens: Secondary | ICD-10-CM | POA: Diagnosis not present

## 2016-07-08 ENCOUNTER — Telehealth: Payer: Self-pay | Admitting: *Deleted

## 2016-07-09 NOTE — Telephone Encounter (Signed)
Please call patient

## 2016-07-12 ENCOUNTER — Telehealth: Payer: Self-pay | Admitting: Neurology

## 2016-07-12 NOTE — Telephone Encounter (Signed)
Pt is returning the call to [facility], she will be at home all day awaiting call back

## 2016-07-12 NOTE — Telephone Encounter (Signed)
Pt is returning the call to [facility] she will be at home all day awaiting call back.

## 2016-07-12 NOTE — Telephone Encounter (Signed)
Left message for a return call

## 2016-07-12 NOTE — Telephone Encounter (Signed)
Duplicate task - see other phone note.

## 2016-07-12 NOTE — Telephone Encounter (Signed)
Patient is coming by our office on Wednesday prior to noon to pick up her medical records.  She is requesting her office notes from 12/31/15 and 06/29/16 so she can send them to her insurance agent.  She will need to sign a release form prior to being provided these records.

## 2016-07-14 NOTE — Telephone Encounter (Signed)
Patient called to let Sharyn Lull know she will not be coming to pick up medical records today, but would like a returned call.  Please call

## 2016-07-16 DIAGNOSIS — L304 Erythema intertrigo: Secondary | ICD-10-CM | POA: Diagnosis not present

## 2016-07-16 DIAGNOSIS — Z85828 Personal history of other malignant neoplasm of skin: Secondary | ICD-10-CM | POA: Diagnosis not present

## 2016-07-16 DIAGNOSIS — L298 Other pruritus: Secondary | ICD-10-CM | POA: Diagnosis not present

## 2016-07-16 DIAGNOSIS — L820 Inflamed seborrheic keratosis: Secondary | ICD-10-CM | POA: Diagnosis not present

## 2016-07-16 DIAGNOSIS — L308 Other specified dermatitis: Secondary | ICD-10-CM | POA: Diagnosis not present

## 2016-07-16 DIAGNOSIS — L853 Xerosis cutis: Secondary | ICD-10-CM | POA: Diagnosis not present

## 2016-07-16 DIAGNOSIS — L299 Pruritus, unspecified: Secondary | ICD-10-CM | POA: Diagnosis not present

## 2016-07-16 DIAGNOSIS — Z79899 Other long term (current) drug therapy: Secondary | ICD-10-CM | POA: Diagnosis not present

## 2016-07-16 DIAGNOSIS — L282 Other prurigo: Secondary | ICD-10-CM | POA: Diagnosis not present

## 2016-07-16 LAB — BASIC METABOLIC PANEL
BUN: 16 mg/dL (ref 4–21)
Creatinine: 0.7 mg/dL (ref 0.5–1.1)
Glucose: 108 mg/dL
Potassium: 4.2 mmol/L (ref 3.4–5.3)
Sodium: 133 mmol/L — AB (ref 137–147)

## 2016-07-16 LAB — HEPATIC FUNCTION PANEL
ALT: 10 U/L (ref 7–35)
AST: 16 U/L (ref 13–35)
Alkaline Phosphatase: 60 U/L (ref 25–125)
Bilirubin, Total: 0.4 mg/dL

## 2016-07-16 LAB — CBC AND DIFFERENTIAL
HCT: 38 % (ref 36–46)
Hemoglobin: 12.8 g/dL (ref 12.0–16.0)
Neutrophils Absolute: 5135 /uL
Platelets: 262 10*3/uL (ref 150–399)
WBC: 7.9 10^3/mL

## 2016-07-16 LAB — TSH: TSH: 0.89 u[IU]/mL (ref 0.41–5.90)

## 2016-07-21 DIAGNOSIS — M545 Low back pain: Secondary | ICD-10-CM | POA: Diagnosis not present

## 2016-07-29 DIAGNOSIS — M545 Low back pain: Secondary | ICD-10-CM | POA: Diagnosis not present

## 2016-08-04 DIAGNOSIS — Z6825 Body mass index (BMI) 25.0-25.9, adult: Secondary | ICD-10-CM | POA: Diagnosis not present

## 2016-08-04 DIAGNOSIS — R030 Elevated blood-pressure reading, without diagnosis of hypertension: Secondary | ICD-10-CM | POA: Diagnosis not present

## 2016-08-04 DIAGNOSIS — M47816 Spondylosis without myelopathy or radiculopathy, lumbar region: Secondary | ICD-10-CM | POA: Diagnosis not present

## 2016-08-09 DIAGNOSIS — L298 Other pruritus: Secondary | ICD-10-CM | POA: Diagnosis not present

## 2016-08-09 DIAGNOSIS — Z85828 Personal history of other malignant neoplasm of skin: Secondary | ICD-10-CM | POA: Diagnosis not present

## 2016-08-09 DIAGNOSIS — L853 Xerosis cutis: Secondary | ICD-10-CM | POA: Diagnosis not present

## 2016-08-11 ENCOUNTER — Encounter: Payer: Self-pay | Admitting: Family Medicine

## 2016-08-13 DIAGNOSIS — M48061 Spinal stenosis, lumbar region without neurogenic claudication: Secondary | ICD-10-CM | POA: Diagnosis not present

## 2016-08-13 DIAGNOSIS — M5416 Radiculopathy, lumbar region: Secondary | ICD-10-CM | POA: Diagnosis not present

## 2016-08-18 NOTE — Progress Notes (Signed)
Chief Complaint  Patient presents with  . Follow-up    6 month f/u     HPI: Susan Ford 83 y.o. come in for Chronic disease management  l;ast pv wv 10 17  Has had   Seen cards    Hs of peroperative a flutter   Estradiol? Wants to remain on this she feels that it helps her and her age once to continue. 10 days ago her leg ran into a dishwasher and gouged it out. She has cleaned it with antibiotic ointment and covered it up no fever or increasing pain or discharge but wants it looked at. She saw Dr. Ellene Route and he recommended she increase her Celebrex to twice a day because of the serious back pain that she has from arthritis. She just started this. She did have her lab work done last month. Her son has been very sick recently but is not getting better. This causes stress. She is also seen dermatologist in regard to itching.  Dr [place] [facility] office They did bunch of lab work and could not find anything and use creams and topicals which helped a little bit but then came back. When she had an injection of triamcinolone into her spine for back pain her itching went away this was recent. Wonders if she should see an allergist if it returns.  ROS: See pertinent positives and negatives per HPI. No cp sob falling neuopathy the same on gabapentin   Past Medical History:  Diagnosis Date  . Arthritis   . Cataract 2007   bilateral cat ext   . Cholecystitis   . Complication of anesthesia Feb 21, 2014   atrial fib after cholecystectomy, lasted a few hours, none since  . Dysrhythmia 02-21-2014   after cholecystectomy had atrial fib for a few hours, none since  . GERD (gastroesophageal reflux disease)    Had endoscopy no history of Barrett's  . Hx of nonmelanoma skin cancer   . Unspecified hereditary and idiopathic peripheral neuropathy     Family History  Problem Relation Age of Onset  . Neurodegenerative disease Mother     Mikey Bussing Drager died at 15  . Alcohol abuse    . Heart disease       Social History   Social History  . Marital status: Widowed    Spouse name: Gwyndolyn Saxon  . Number of children: N/A  . Years of education: N/A   Occupational History  . Retired     Therapist, sports   Social History Main Topics  . Smoking status: Former Smoker    Packs/day: 0.25    Years: 7.00    Types: Cigarettes    Quit date: 04/27/1963  . Smokeless tobacco: Never Used  . Alcohol use 4.2 oz/week    7 Glasses of wine per week     Comment: 1 glass of wine daily  . Drug use: No  . Sexual activity: Yes   Other Topics Concern  . None   Social History Narrative   Retired Therapist, sports    widowed since    Spring  17    [facility] of 2    Regular exercise former smoker   G3P3   From [place] originally           Outpatient Medications Prior to Visit  Medication Sig Dispense Refill  . ascorbic acid (VITAMIN C) 1000 MG tablet Take 1,000 mg by mouth daily after lunch.     Marland Kitchen aspirin 81 MG tablet Take 81 mg by mouth  daily after lunch.     . calcium citrate-vitamin D (CITRACAL+D) 315-200 MG-UNIT per tablet Take 1 tablet by mouth daily.     . celecoxib (CELEBREX) 200 MG capsule TAKE 1 CAPSULE (200 MG TOTAL) BY MOUTH DAILY. (Patient taking differently: Take 200 mg by mouth 2 (two) times daily. ) 90 capsule 0  . estradiol (CLIMARA - DOSED IN MG/24 HR) 0.025 mg/24hr patch PLACE 1 PATCH ONTO THE SKIN ONCE A WEEK. ON SATURDAY 8 patch 4  . famotidine (PEPCID) 20 MG tablet Take 20 mg by mouth daily.    Marland Kitchen gabapentin (NEURONTIN) 600 MG tablet Take 1 tablet (600 mg total) by mouth 4 (four) times daily. 360 tablet 4  . Multiple Vitamin (MULTIVITAMIN) tablet Take 1 tablet by mouth daily after lunch.     . simvastatin (ZOCOR) 10 MG tablet TAKE 1 TABLET (10 MG TOTAL) BY MOUTH AT BEDTIME. 90 tablet 3   No facility-administered medications prior to visit.      EXAM:  BP 119/60 (BP Location: Right Arm, Patient Position: Sitting, Cuff Size: Normal)   Pulse 74   Temp 98.5 F (36.9 C) (Oral)   Ht 5\' 4"  (1.626 m)   Wt 136  lb 6.4 oz (61.9 kg)   BMI 23.41 kg/m   Body mass index is 23.41 kg/m.  GENERAL: vitals reviewed and listed above, alert, oriented, appears well hydrated and in no acute distress HEENT: atraumatic, conjunctiva  clear, no obvious abnormalities on inspection of external nose and ears NECK: no obvious masses on inspection palpation  LUNGS: clear to auscultation bilaterally, no wheezes, rales or rhonchi, good air movement CV: HRRR, no clubbing cyanosis or  peripheral edema nl cap refill  MS: moves all extremities without noticeable focal  Abnormality  Right lower leg showed large avulstion  Injury with open fatty tissue   About 3 + cm irreg   Lateral bruisng no weeping infection and no redness or infection.  PSYCH: pleasant and cooperative, no obvious depression or anxiety Lab Results  Component Value Date   WBC 7.9 07/16/2016   HGB 12.8 07/16/2016   HCT 38 07/16/2016   PLT 262 07/16/2016   GLUCOSE 87 02/18/2016   CHOL 186 02/18/2016   TRIG 123.0 02/18/2016   HDL 80.00 02/18/2016   LDLDIRECT 153 02/12/2009   LDLCALC 81 02/18/2016   ALT 10 07/16/2016   AST 16 07/16/2016   NA 133 (A) 07/16/2016   K 4.2 07/16/2016   CL 102 02/18/2016   CREATININE 0.7 07/16/2016   BUN 16 07/16/2016   CO2 26 02/18/2016   TSH 0.89 07/16/2016   INR 1.21 02/22/2014   HGBA1C 5.6 02/18/2016   BP Readings from Last 3 Encounters:  08/19/16 119/60  06/29/16 132/72  06/07/16 126/84  lab from derm nl except slight elevation eos 553   ASSESSMENT AND PLAN:  Discussed the following assessment and plan:  Wound of right lower extremity, initial encounter - large avulsion  over a week old no infection at thsi time   wound care andfu if needed   Medication management  Neuropathy  Estrogen deficiency  High risk medication use  Itching - better after back inj will foll poss allerg is ongoing derm didi labs  Arthritis Reviewed laboratory studies she is aware of the risk-benefit Celebrex twice a day and  estrogen patch. Wound on her right lower extremity is quite significant but about 11 days old without signs of infection will take quite a while to heal. Continue local care she  is an Therapist, sports keep covered clean antibiotic ointment and follow-up if any signs of infection increasing redness were not healing. Injury prevention in the future she is aware. In regard to the itching glad she is better on and injected steroid or returns consider seeing allergist she did have some increase in eosinophils on her blood count that was done at the dermatology office. It is also always possible that one of her medications could be causing this although not clearcut. -Patient advised to return or notify health care team  if  new concerns arise.  Patient Instructions  Your kidney function is stable. We'll continue to follow about every 6 months. In regard to the injury to your leg keep it covered with clean nonstick dressing clean with gentle school soaking of sterile saline or such Antibiotic ointment as you are doing It may take weeks and weeks to heal. If you're getting signs of infection with increased redness swelling around the area contact us for recheck. Keep the area padded so it avoid reinjury. Take measures to avoid reinjury  Wound care Deep Skin Avulsion A deep skin avulsion is a type of open wound. It often results from a severe injury (trauma) that tears away all layers of the skin or an entire body part. The areas of the body that are most often affected by a deep skin avulsion include the face, lips, ears, nose, and fingers. A deep skin avulsion may make structures below the skin become visible. You may be able to see muscle, bone, nerves, and blood vessels. A deep skin avulsion can also damage important structures beneath the skin. These include tendons, ligaments, nerves, or blood vessels. What are the causes? Injuries that often cause a deep skin avulsion include:  Being crushed.  Falling  against a jagged surface.  Animal bites.  Gunshot wounds.  Severe burns.  Injuries that involve being dragged, such as bicycle or motorcycle accidents. What are the signs or symptoms? Symptoms of a deep skin avulsion include:  Pain.  Numbness.  Swelling.  A misshapen body part.  Bleeding, which may be heavy.  Fluid leaking from the wound. How is this diagnosed? This condition may be diagnosed with a medical history and physical exam. You may also have X-rays done. How is this treated? The treatment that is chosen for a deep skin avulsion depends on how large and deep the wound is and where it is located. Treatment for all types of avulsions usually starts with:  Controlling the bleeding.  Washing out the wound with a germ-free (sterile) salt-water solution.  Removing dead tissue from the wound. A wound may be closed or left open to heal. This depends on the size and location of the wound and whether it is likely to become infected. Wounds are usually covered or closed if they expose blood vessels, nerves, bone, or cartilage.  Wounds that are small and clean may be closed with stitches (sutures).  Wounds that cannot be closed with sutures may be covered with a piece of skin (graft) or a skin flap. Skin may be taken from on or near the wound, from another part of the body, or from a donor.  Wounds may be left open if they are hard to close or they may become infected. These wounds heal over time from the bottom up. You may also receive medicine. This may include:  Antibiotics.  A tetanus shot.  Rabies vaccine. Follow these instructions at home: Medicines   Take or apply over-the-counter and  prescription medicines only as told by your health care provider.  If you were prescribed an antibiotic, take or apply it as told by your health care provider. Do not stop taking the antibiotic even if your condition improves.  You may get anti-itch medicine while your wound is  healing. Use it only as told by your health care provider. Wound care   There are many ways to close and cover a wound. For example, a wound can be covered with sutures, skin glue, or adhesive strips. Follow instructions from your health care provider about:  How to take care of your wound.  When and how you should change your bandage (dressing).  When you should remove your dressing.  Removing whatever was used to close your wound.  Keep the dressing dry as told by your health care provider. Do not take baths, swim, use a hot tub, or do anything that would put your wound underwater until your health care provider approves.  Clean the wound each day or as told by your health care provider.  Wash the wound with mild soap and water.  Rinse the wound with water to remove all soap.  Pat the wound dry with a clean towel. Do not rub it.  Do not scratch or pick at the wound.  Check your wound every day for signs of infection. Watch for:  Redness, swelling, or pain.  Fluid, blood, or pus. General instructions   Raise (elevate) the injured area above the level of your heart while you are sitting or lying down.  Keep all follow-up visits as told by your health care provider. This is important. Contact a health care provider if:  You received a tetanus shot and you have swelling, severe pain, redness, or bleeding at the injection site.  You have a fever.  Your pain is not controlled with medicine.  You have increased redness, swelling, or pain at the site of your wound.  You have fluid, blood, or pus coming from your wound.  You notice a bad smell coming from your wound or your dressing.  A wound that was closed breaks open.  You notice something coming out of the wound, such as wood or glass.  You notice a change in the color of your skin near your wound.  You develop a new rash.  You need to change the dressing frequently due to fluid, blood, or pus draining from the  wound. Get help right away if:  Your pain suddenly increases and is severe.  You develop severe swelling around the wound.  You develop numbness around the wound.  You have nausea and vomiting that does not go away after 24 hours.  You feel light-headed, weak, or faint.  You develop chest pain.  You have trouble breathing.  Your wound is on your hand or foot and you cannot properly move a finger or toe.  The wound is on your hand or foot and you notice that your fingers or toes look pale or bluish.  You have a red streak going away from your wound. This information is not intended to replace advice given to you by your health care provider. Make sure you discuss any questions you have with your health care provider. Document Released: 06/08/2006 Document Revised: 09/18/2015 Document Reviewed: 04/17/2014 Elsevier Interactive Patient Education  2017 [place] K. Panosh M.D.

## 2016-08-19 ENCOUNTER — Encounter: Payer: Self-pay | Admitting: Internal Medicine

## 2016-08-19 ENCOUNTER — Ambulatory Visit (INDEPENDENT_AMBULATORY_CARE_PROVIDER_SITE_OTHER): Payer: Medicare Other | Admitting: Internal Medicine

## 2016-08-19 VITALS — BP 119/60 | HR 74 | Temp 98.5°F | Ht 64.0 in | Wt 136.4 lb

## 2016-08-19 DIAGNOSIS — E2839 Other primary ovarian failure: Secondary | ICD-10-CM | POA: Diagnosis not present

## 2016-08-19 DIAGNOSIS — M1990 Unspecified osteoarthritis, unspecified site: Secondary | ICD-10-CM | POA: Diagnosis not present

## 2016-08-19 DIAGNOSIS — Z79899 Other long term (current) drug therapy: Secondary | ICD-10-CM | POA: Diagnosis not present

## 2016-08-19 DIAGNOSIS — L299 Pruritus, unspecified: Secondary | ICD-10-CM

## 2016-08-19 DIAGNOSIS — G629 Polyneuropathy, unspecified: Secondary | ICD-10-CM

## 2016-08-19 DIAGNOSIS — S81801A Unspecified open wound, right lower leg, initial encounter: Secondary | ICD-10-CM | POA: Diagnosis not present

## 2016-08-19 NOTE — Patient Instructions (Addendum)
Your kidney function is stable. We'll continue to follow about every 6 months. In regard to the injury to your leg keep it covered with clean nonstick dressing clean with gentle school soaking of sterile saline or such Antibiotic ointment as you are doing It may take weeks and weeks to heal. If you're getting signs of infection with increased redness swelling around the area contact us for recheck. Keep the area padded so it avoid reinjury. Take measures to avoid reinjury  Wound care Deep Skin Avulsion A deep skin avulsion is a type of open wound. It often results from a severe injury (trauma) that tears away all layers of the skin or an entire body part. The areas of the body that are most often affected by a deep skin avulsion include the face, lips, ears, nose, and fingers. A deep skin avulsion may make structures below the skin become visible. You may be able to see muscle, bone, nerves, and blood vessels. A deep skin avulsion can also damage important structures beneath the skin. These include tendons, ligaments, nerves, or blood vessels. What are the causes? Injuries that often cause a deep skin avulsion include:  Being crushed.  Falling against a jagged surface.  Animal bites.  Gunshot wounds.  Severe burns.  Injuries that involve being dragged, such as bicycle or motorcycle accidents. What are the signs or symptoms? Symptoms of a deep skin avulsion include:  Pain.  Numbness.  Swelling.  A misshapen body part.  Bleeding, which may be heavy.  Fluid leaking from the wound. How is this diagnosed? This condition may be diagnosed with a medical history and physical exam. You may also have X-rays done. How is this treated? The treatment that is chosen for a deep skin avulsion depends on how large and deep the wound is and where it is located. Treatment for all types of avulsions usually starts with:  Controlling the bleeding.  Washing out the wound with a germ-free  (sterile) salt-water solution.  Removing dead tissue from the wound. A wound may be closed or left open to heal. This depends on the size and location of the wound and whether it is likely to become infected. Wounds are usually covered or closed if they expose blood vessels, nerves, bone, or cartilage.  Wounds that are small and clean may be closed with stitches (sutures).  Wounds that cannot be closed with sutures may be covered with a piece of skin (graft) or a skin flap. Skin may be taken from on or near the wound, from another part of the body, or from a donor.  Wounds may be left open if they are hard to close or they may become infected. These wounds heal over time from the bottom up. You may also receive medicine. This may include:  Antibiotics.  A tetanus shot.  Rabies vaccine. Follow these instructions at home: Medicines   Take or apply over-the-counter and prescription medicines only as told by your health care provider.  If you were prescribed an antibiotic, take or apply it as told by your health care provider. Do not stop taking the antibiotic even if your condition improves.  You may get anti-itch medicine while your wound is healing. Use it only as told by your health care provider. Wound care   There are many ways to close and cover a wound. For example, a wound can be covered with sutures, skin glue, or adhesive strips. Follow instructions from your health care provider about:  How to take  care of your wound.  When and how you should change your bandage (dressing).  When you should remove your dressing.  Removing whatever was used to close your wound.  Keep the dressing dry as told by your health care provider. Do not take baths, swim, use a hot tub, or do anything that would put your wound underwater until your health care provider approves.  Clean the wound each day or as told by your health care provider.  Wash the wound with mild soap and water.  Rinse  the wound with water to remove all soap.  Pat the wound dry with a clean towel. Do not rub it.  Do not scratch or pick at the wound.  Check your wound every day for signs of infection. Watch for:  Redness, swelling, or pain.  Fluid, blood, or pus. General instructions   Raise (elevate) the injured area above the level of your heart while you are sitting or lying down.  Keep all follow-up visits as told by your health care provider. This is important. Contact a health care provider if:  You received a tetanus shot and you have swelling, severe pain, redness, or bleeding at the injection site.  You have a fever.  Your pain is not controlled with medicine.  You have increased redness, swelling, or pain at the site of your wound.  You have fluid, blood, or pus coming from your wound.  You notice a bad smell coming from your wound or your dressing.  A wound that was closed breaks open.  You notice something coming out of the wound, such as wood or glass.  You notice a change in the color of your skin near your wound.  You develop a new rash.  You need to change the dressing frequently due to fluid, blood, or pus draining from the wound. Get help right away if:  Your pain suddenly increases and is severe.  You develop severe swelling around the wound.  You develop numbness around the wound.  You have nausea and vomiting that does not go away after 24 hours.  You feel light-headed, weak, or faint.  You develop chest pain.  You have trouble breathing.  Your wound is on your hand or foot and you cannot properly move a finger or toe.  The wound is on your hand or foot and you notice that your fingers or toes look pale or bluish.  You have a red streak going away from your wound. This information is not intended to replace advice given to you by your health care provider. Make sure you discuss any questions you have with your health care provider. Document Released:  06/08/2006 Document Revised: 09/18/2015 Document Reviewed: 04/17/2014 Elsevier Interactive Patient Education  2017 [employer].

## 2016-08-21 ENCOUNTER — Other Ambulatory Visit: Payer: Self-pay | Admitting: Internal Medicine

## 2016-08-23 NOTE — Telephone Encounter (Signed)
Denied.  Filled on 06/28/16 for 5 months.  Message sent to the pharmacy to have them check file.

## 2016-09-01 ENCOUNTER — Ambulatory Visit (INDEPENDENT_AMBULATORY_CARE_PROVIDER_SITE_OTHER): Payer: Medicare Other | Admitting: Podiatry

## 2016-09-01 ENCOUNTER — Encounter: Payer: Self-pay | Admitting: Podiatry

## 2016-09-01 DIAGNOSIS — L600 Ingrowing nail: Secondary | ICD-10-CM

## 2016-09-01 NOTE — Patient Instructions (Addendum)

## 2016-09-01 NOTE — Progress Notes (Signed)
Subjective:    Patient ID: Susan Ford, female   DOB: 83 y.o.   MRN: [id number]   HPI patient states the right big toe is been very tender in the border and making it hard to wear shoes and the previous treatment helped temporarily    ROS      Objective:  Physical Exam patient has incurvation medial border right hallux and I did note that neurovascular status is intact with good cap fill time to the digit    Assessment:    Ingrown toenail deformity right hallux medial border     Plan:    Recommended correction and explained procedure to the patient. Explain risk and today I infiltrated the right hallux 60 mg like Marcaine mixture remove the medial border exposed matrix and applied phenol 3 applications 30 seconds followed by alcohol lavage

## 2016-09-06 ENCOUNTER — Other Ambulatory Visit: Payer: Self-pay | Admitting: Emergency Medicine

## 2016-09-06 MED ORDER — ESTRADIOL 0.025 MG/24HR TD PTWK: MEDICATED_PATCH | TRANSDERMAL | 4 refills | Status: DC

## 2016-09-07 ENCOUNTER — Other Ambulatory Visit: Payer: Self-pay | Admitting: Emergency Medicine

## 2016-09-07 MED ORDER — CELECOXIB 200 MG PO CAPS: 200.0000 mg | ORAL_CAPSULE | Freq: Every day | ORAL | 0 refills | Status: DC

## 2016-10-21 DIAGNOSIS — M47816 Spondylosis without myelopathy or radiculopathy, lumbar region: Secondary | ICD-10-CM | POA: Diagnosis not present

## 2016-11-08 DIAGNOSIS — M5136 Other intervertebral disc degeneration, lumbar region: Secondary | ICD-10-CM | POA: Diagnosis not present

## 2016-11-08 DIAGNOSIS — M4726 Other spondylosis with radiculopathy, lumbar region: Secondary | ICD-10-CM | POA: Diagnosis not present

## 2016-11-08 DIAGNOSIS — M48061 Spinal stenosis, lumbar region without neurogenic claudication: Secondary | ICD-10-CM | POA: Diagnosis not present

## 2016-11-08 DIAGNOSIS — M5416 Radiculopathy, lumbar region: Secondary | ICD-10-CM | POA: Diagnosis not present

## 2016-11-23 DIAGNOSIS — L298 Other pruritus: Secondary | ICD-10-CM | POA: Diagnosis not present

## 2016-11-23 DIAGNOSIS — Z85828 Personal history of other malignant neoplasm of skin: Secondary | ICD-10-CM | POA: Diagnosis not present

## 2016-12-23 ENCOUNTER — Other Ambulatory Visit: Payer: Self-pay | Admitting: Emergency Medicine

## 2016-12-23 ENCOUNTER — Telehealth: Payer: Self-pay | Admitting: Internal Medicine

## 2016-12-23 DIAGNOSIS — L299 Pruritus, unspecified: Secondary | ICD-10-CM

## 2016-12-23 NOTE — Telephone Encounter (Signed)
Please advise 

## 2016-12-23 NOTE — Telephone Encounter (Signed)
  Ok to refer,

## 2016-12-23 NOTE — Telephone Encounter (Signed)
Referral has been placed. 

## 2016-12-23 NOTE — Telephone Encounter (Signed)
° ° °  Pt call to ask for a referral to see a Allergy doctor she said her body itch all the time. She saw a dermatologist and they gave her something and it worked for a while

## 2016-12-29 DIAGNOSIS — J3089 Other allergic rhinitis: Secondary | ICD-10-CM | POA: Diagnosis not present

## 2016-12-29 DIAGNOSIS — L299 Pruritus, unspecified: Secondary | ICD-10-CM | POA: Diagnosis not present

## 2017-01-07 DIAGNOSIS — L299 Pruritus, unspecified: Secondary | ICD-10-CM | POA: Diagnosis not present

## 2017-01-13 DIAGNOSIS — M47816 Spondylosis without myelopathy or radiculopathy, lumbar region: Secondary | ICD-10-CM | POA: Diagnosis not present

## 2017-01-13 DIAGNOSIS — M5416 Radiculopathy, lumbar region: Secondary | ICD-10-CM | POA: Diagnosis not present

## 2017-01-14 ENCOUNTER — Encounter: Payer: Self-pay | Admitting: Internal Medicine

## 2017-01-19 DIAGNOSIS — Z23 Encounter for immunization: Secondary | ICD-10-CM | POA: Diagnosis not present

## 2017-01-28 DIAGNOSIS — M4726 Other spondylosis with radiculopathy, lumbar region: Secondary | ICD-10-CM | POA: Diagnosis not present

## 2017-02-26 ENCOUNTER — Other Ambulatory Visit: Payer: Self-pay | Admitting: Internal Medicine

## 2017-03-03 NOTE — Progress Notes (Signed)
Chief Complaint  Patient presents with  . YEARLY VISIT    No new concerns    HPI: Susan Ford 83 y.o. comes in today for yearly  Visit  .Since last visit. Medicine management medication management.  She is having spinal disease arthritis and neuropathy separate problems.  Sees Dr. Ellene Route and Dr. Benjamin Stain.  Injections not helping .    Neurologist   Gabapentin.  And celbrex   Lipids   No se .  Of medication.  She has an abrasion laceration on her left lower extremity that she keeps covered she keeps getting them from bumping into the dishwasher when it is open.  She does her home housework but has to stop and rest because of her back and neuropathy pain.  Does have dry skin uses lots of lotion.  No ulcers on her feet has seen a podiatrist.  Health Maintenance  Topic Date Due  . TETANUS/TDAP  11/07/2021  . INFLUENZA VACCINE  Completed  . DEXA SCAN  Completed  . PNA vac Low Risk Adult  Completed   Health Maintenance Review LIFESTYLE:  Exercise:   Not much cuase of neuropathy in leges and back  Elsner     adls and sitting  Some help  Evaluation .  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   Hearing: ok  Vision:  No limitations at present . Last eye check UTD  Safety:  Has smoke detector and wears seat belts.   No excess sun exposure. Sees dentist regularly.  Falls:  no  Advance directive :   Has one.  Memory: Felt to be good  , no major changes  Depression: No anhedonia unusual crying or depressive symptoms  Nutrition: Eats well balanced diet; adequate calcium and vitamin D. No swallowing chewing problems.  Injury: no major injuries in the last six months.  Other healthcare providers:  Reviewed today .  Social:  Livesalone  No pets   Preventive parameters: up-to-date     ADLS:   There are no problems or need for  assistance  driving, feeding, obtaining food, dressing, toileting and bathing, managing money using phone. She is independent.   ROS:  GEN/ HEENT: No fever, significant weight changes sweats headaches vision problems hearing changes, CV/ PULM; No chest pain shortness of breath cough, syncope,edema  change in exercise tolerance. GI /GU: No adominal pain, vomiting, change in bowel habits. No blood in the stool. No significant GU symptoms. SKIN/HEME: ,no acute skin rashes suspicious lesions or bleeding. No lymphadenopathy, nodules, masses.  NEURO/ PSYCH: See above. No depression anxiety. IMM/ Allergy: No unusual infections.  Allergy .   REST of 12 system review negative except as per HPI   Past Medical History:  Diagnosis Date  . Arthritis   . Cataract 2007   bilateral cat ext   . Cholecystitis   . Complication of anesthesia Feb 21, 2014   atrial fib after cholecystectomy, lasted a few hours, none since  . Dysrhythmia 02-21-2014   after cholecystectomy had atrial fib for a few hours, none since  . GERD (gastroesophageal reflux disease)    Had endoscopy no history of Barrett's  . Hx of nonmelanoma skin cancer   . Unspecified hereditary and idiopathic peripheral neuropathy     Family History  Problem Relation Age of Onset  . Neurodegenerative disease Mother  Shy Drager died at 2  . Alcohol abuse Unknown   . Heart disease Unknown     Social History   Socioeconomic History  . Marital status: Widowed    Spouse name: Gwyndolyn Saxon  . Number of children: None  . Years of education: None  . Highest education level: None  Social Needs  . Financial resource strain: None  . Food insecurity - worry: None  . Food insecurity - inability: None  . Transportation needs - medical: None  . Transportation needs - non-medical: None  Occupational History  . Occupation: Retired    Comment: Therapist, sports  Tobacco Use  . Smoking status: Former Smoker    Packs/day: 0.25    Years: 7.00    Pack  years: 1.75    Types: Cigarettes    Last attempt to quit: 04/27/1963    Years since quitting: 53.8  . Smokeless tobacco: Never Used  Substance and Sexual Activity  . Alcohol use: Yes    Alcohol/week: 4.2 oz    Types: 7 Glasses of wine per week    Comment: 1 glass of wine daily  . Drug use: No  . Sexual activity: Yes  Other Topics Concern  . None  Social History Narrative   Retired Therapist, sports    widowed since    Spring  17    [facility]    Regular exercise former smoker   G3P3   From [place] originally        Outpatient Encounter Medications as of 03/04/2017  Medication Sig  . ascorbic acid (VITAMIN C) 1000 MG tablet Take 1,000 mg by mouth daily after lunch.   Marland Kitchen aspirin 81 MG tablet Take 81 mg by mouth daily after lunch.   . calcium citrate-vitamin D (CITRACAL+D) 315-200 MG-UNIT per tablet Take 1 tablet by mouth daily.   . celecoxib (CELEBREX) 200 MG capsule Take 1 capsule (200 mg total) by mouth daily.  Marland Kitchen estradiol (CLIMARA - DOSED IN MG/24 HR) 0.025 mg/24hr patch PLACE 1 PATCH ONTO THE SKIN ONCE A WEEK. ON SATURDAY  . famotidine (PEPCID) 20 MG tablet Take 20 mg by mouth daily.  Marland Kitchen gabapentin (NEURONTIN) 600 MG tablet Take 1 tablet (600 mg total) by mouth 4 (four) times daily.  Marland Kitchen levocetirizine (XYZAL) 5 MG tablet Take 1 tablet daily by mouth.  . loratadine (CLARITIN) 10 MG tablet Take 10 mg daily by mouth.  . Multiple Vitamin (MULTIVITAMIN) tablet Take 1 tablet by mouth daily after lunch.   . simvastatin (ZOCOR) 10 MG tablet TAKE 1 TABLET (10 MG TOTAL) BY MOUTH AT BEDTIME.   No facility-administered encounter medications on file as of 03/04/2017.     EXAM:  BP 122/78 (BP Location: Left Arm, Patient Position: Sitting, Cuff Size: Normal)   Pulse 81   Temp 97.8 F (36.6 C) (Oral)   Ht 5\' 1"  (1.549 m)   Wt 130 lb (59 kg)   BMI 24.56 kg/m   Body mass index is 24.56 kg/m.  Physical Exam: Vital signs reviewed PIR:JJOA is a well-developed well-nourished alert cooperative   who  appears stated age in no acute distress.  Ambulatory some kyphosis HEENT: normocephalic atraumatic , Eyes: PERRL EOM's full, conjunctiva clear, Nares: paten,t no deformity discharge or tenderness., Ears: no deformity EAC's clear TMs with normal landmarks. Mouth: clear OP, no lesions, edema.  Moist mucous membranes. Dentition in adequate repair. NECK: supple without masses, thyromegaly or bruits. CHEST/PULM:  Clear to auscultation and percussion breath sounds equal no wheeze , rales  or rhonchi. No chest wall deformities or tenderness.  Rest exam no discharge or dimpling. CV: PMI is nondisplaced, S1 S2 no gallops, murmurs, rubs. Peripheral pulses are full without delay.No JVD .  ABDOMEN: Bowel sounds normal nontender  No guard or rebound, no hepato splenomegal no CVA tenderness.   Extremtities:  No clubbing cyanosis or edema, no acute joint swelling or redness no focal atrophy osteoarthritic changes no effusions. NEURO:  Oriented x3, cranial nerves 3-12 appear to be intact, no obvious focal weakness,gait forward and somewhat flat-footed like neuropathic gait sKIN: No acute rashes normal turgor, color, dry skin legs there is an avulsion superficial skin tear 1.5 cm left shin no infection discharge or serious bruising.  Thickened toenails.  No ulcers on feet. PSYCH: Oriented, good eye contact, no obvious depression anxiety, cognition and judgment appear normal. LN: no cervical axillaryadenopathy No noted deficits in memory, attention, and speech.   Lab Results  Component Value Date   WBC 7.9 07/16/2016   HGB 12.8 07/16/2016   HCT 38 07/16/2016   PLT 262 07/16/2016   GLUCOSE 87 02/18/2016   CHOL 186 02/18/2016   TRIG 123.0 02/18/2016   HDL 80.00 02/18/2016   LDLDIRECT 153 02/12/2009   LDLCALC 81 02/18/2016   ALT 10 07/16/2016   AST 16 07/16/2016   NA 133 (A) 07/16/2016   K 4.2 07/16/2016   CL 102 02/18/2016   CREATININE 0.7 07/16/2016   BUN 16 07/16/2016   CO2 26 02/18/2016   TSH 0.89  07/16/2016   INR 1.21 02/22/2014   HGBA1C 5.6 02/18/2016    ASSESSMENT AND PLAN:  Discussed the following assessment and plan:  Hyperlipidemia, unspecified hyperlipidemia type - Plan: Basic metabolic panel, Hepatic function panel, Lipid panel  Medication management - Plan: Basic metabolic panel, Hepatic function panel, Lipid panel  High risk medication use - cox 2  - Plan: Basic metabolic panel, Hepatic function panel, Lipid panel  Neuropathy - Plan: Basic metabolic panel, Hepatic function panel, Lipid panel  Hyperglycemia - hasd been stable - Plan: Basic metabolic panel, Hepatic function panel, Lipid panel  Avulsion of skin - healing left shin  disc protection and prevention  Spinal stenosis of lumbar region, unspecified whether neurogenic claudication present  disc  shingrix vaccine    Advised  Patient Care Team: Burnis Medin, MD as PCP - General Martinique, Amy, MD as Consulting Physician (Dermatology) Marcial Pacas, MD as Consulting Physician (Neurology) Kristeen Miss, MD as Consulting Physician (Neurosurgery)  Patient Instructions  Will notify you  of labs when available.   No change in meds .  Can try tegaderm if not infected on wound.  Continued protection and fall prevention   Standley Brooking. Tyrone Pautsch M.D.

## 2017-03-04 ENCOUNTER — Ambulatory Visit (INDEPENDENT_AMBULATORY_CARE_PROVIDER_SITE_OTHER): Payer: Medicare Other | Admitting: Internal Medicine

## 2017-03-04 ENCOUNTER — Encounter: Payer: Self-pay | Admitting: Internal Medicine

## 2017-03-04 VITALS — BP 122/78 | HR 81 | Temp 97.8°F | Ht 61.0 in | Wt 130.0 lb

## 2017-03-04 DIAGNOSIS — R739 Hyperglycemia, unspecified: Secondary | ICD-10-CM

## 2017-03-04 DIAGNOSIS — T148XXA Other injury of unspecified body region, initial encounter: Secondary | ICD-10-CM | POA: Diagnosis not present

## 2017-03-04 DIAGNOSIS — E785 Hyperlipidemia, unspecified: Secondary | ICD-10-CM | POA: Diagnosis not present

## 2017-03-04 DIAGNOSIS — G629 Polyneuropathy, unspecified: Secondary | ICD-10-CM

## 2017-03-04 DIAGNOSIS — Z79899 Other long term (current) drug therapy: Secondary | ICD-10-CM | POA: Diagnosis not present

## 2017-03-04 DIAGNOSIS — M48061 Spinal stenosis, lumbar region without neurogenic claudication: Secondary | ICD-10-CM | POA: Diagnosis not present

## 2017-03-04 LAB — LIPID PANEL
Cholesterol: 185 mg/dL (ref 0–200)
HDL: 75.1 mg/dL (ref >39.00)
LDL Cholesterol: 87 mg/dL (ref 0–99)
NonHDL: 109.94
Total CHOL/HDL Ratio: 2
Triglycerides: 116 mg/dL (ref 0.0–149.0)
VLDL: 23.2 mg/dL (ref 0.0–40.0)

## 2017-03-04 LAB — BASIC METABOLIC PANEL
BUN: 15 mg/dL (ref 6–23)
CO2: 28 mEq/L (ref 19–32)
Calcium: 9.2 mg/dL (ref 8.4–10.5)
Chloride: 101 mEq/L (ref 96–112)
Creatinine, Ser: 0.65 mg/dL (ref 0.40–1.20)
GFR: 91.93 mL/min (ref >60.00)
Glucose, Bld: 96 mg/dL (ref 70–99)
Potassium: 4.2 mEq/L (ref 3.5–5.1)
Sodium: 136 mEq/L (ref 135–145)

## 2017-03-04 LAB — HEPATIC FUNCTION PANEL
ALT: 13 U/L (ref 0–35)
AST: 16 U/L (ref 0–37)
Albumin: 4 g/dL (ref 3.5–5.2)
Alkaline Phosphatase: 46 U/L (ref 39–117)
Bilirubin, Direct: 0.2 mg/dL (ref 0.0–0.3)
Total Bilirubin: 0.8 mg/dL (ref 0.2–1.2)
Total Protein: 6.3 g/dL (ref 6.0–8.3)

## 2017-03-04 NOTE — Patient Instructions (Signed)
Will notify you  of labs when available.   No change in meds .  Can try tegaderm if not infected on wound.  Continued protection and fall prevention

## 2017-03-07 ENCOUNTER — Telehealth: Payer: Self-pay | Admitting: Neurology

## 2017-03-07 NOTE — Telephone Encounter (Signed)
Patient having worsening of neuropathy in her legs. She thinks she needs to be seen.

## 2017-03-07 NOTE — Telephone Encounter (Signed)
Spoke to patient - she has been schedule an appt on 03/22/17 with Dr. Krista Blue.

## 2017-03-16 DIAGNOSIS — M545 Low back pain: Secondary | ICD-10-CM | POA: Diagnosis not present

## 2017-03-22 ENCOUNTER — Ambulatory Visit (INDEPENDENT_AMBULATORY_CARE_PROVIDER_SITE_OTHER): Payer: Medicare Other | Admitting: Neurology

## 2017-03-22 ENCOUNTER — Encounter: Payer: Self-pay | Admitting: Neurology

## 2017-03-22 VITALS — BP 131/80 | HR 56 | Ht 61.0 in | Wt 134.0 lb

## 2017-03-22 DIAGNOSIS — M545 Low back pain, unspecified: Secondary | ICD-10-CM

## 2017-03-22 DIAGNOSIS — G8929 Other chronic pain: Secondary | ICD-10-CM | POA: Diagnosis not present

## 2017-03-22 DIAGNOSIS — G629 Polyneuropathy, unspecified: Secondary | ICD-10-CM | POA: Diagnosis not present

## 2017-03-22 MED ORDER — DULOXETINE HCL 30 MG PO CPEP: 30.0000 mg | ORAL_CAPSULE | Freq: Every day | ORAL | 11 refills | Status: DC

## 2017-03-22 NOTE — Progress Notes (Signed)
[facility]  PATIENT: Susan Ford DOB: 24-Jan-1930   REASON FOR VISIT: Follow-up for peripheral neuropathy, spinal stenosis lumbar region abnormality of gait HISTORY FROM: Patient    HISTORY OF PRESENT ILLNESS:Susan Ford is a 83 year-old right-handed white female,She was a patient of Dr. Erling Cruz, last visit was May 2014   She had a history of lumbar decompression surgery, presenting with chronic low back pain, mild gait difficulty due to pain.  MRI of the lumbar spine 12/25/2010 shows diffuse spondylitic changes and progression of degenerative changes at L3-4 and L4-5 and modest broad disc bulge at L5-S1. There is evidence of neuroforaminal stenosis on the left at L4-5. She has pain in her feet and aching from her knees down.  EMG/NCV 12/24/2011 showing progression of peripheral neuropathy versus a prior study 09/13/2011. There was also a mild overlying acute L5 radiculopathy on the left. She underwent surgical decompression at L4-5 and L5-S1 with bilateral foraminotomies and microdiscectomy on the left at L4-5 02/01/2012 by Dr. Ellene Route, Surgery helped her. She still complains of pain at low back pain, waist line, going down her leg, at both leg.  She denies bowel/bladder incontinence  She has difficulty walking, because of low back pain, worst pain in the morning, she usually take her hydrocodone early morning, then napping for a while, then she can function again.  Previously she has tried Lyrica which caused abdominal cramps, Cymbalta, caused nausea. She had side effects from nortriptyline 20 mg per night. She is on gabapentin 600, 1-1/2 tablets 4 times per day and complains of memory loss on the medication and now questions any improvement in pain. She uses a cane, but not in her house.   She underwent an epidural steroid injection 10/2011 with improvement in her back pain.   She is independent in activities of daily living including driving a car,  cooking,cleaning, shopping, and doing laundry.   UPDATE April 20th 2016:YY Last epidural injection was in 2015, she still has a lot of low back pain, she was put on nortriptyline 25 mg qhs, neurontin 667m qid, hydrocodone prn, celebrex 2049mdaily.  We have reviewed MRI lumbar in Feb 2016: Multilevel degenerative disc disease, with mild spinal stenosis, facet arthropathy, mild to moderate foraminal stenosis at different level She complains of constant low back pian, bilatera legs achy pain, triggered by standing for just 10 minutes, she has to sit down resting after standing or walking for short period of time  She is taking Gabapentin 600 mg 4 times a day, Celexa 200 mg every day, recently started nortriptyline 25 mg every night hydrocodone as needed previously tried Cymbalta, without helping her symptoms, untolerable side effect  UPDATE Jan 14 2015:  She is alone at visit, she still takes celebrex 20060mid, Neurontin 600 mg 4 times a day, she could stand for 10 mintes, then has to sit down because of her low back pain, she also complains of intermittent bilateral lower extremity deep achy pain, She rarely needs narcotics now, denies bowel and bladder incontinence, no bilateral lower extremity numbness  UPDATE Sept 6th 2017: Last clinical visit was with CarHoyle Sauer June 2017, she is concerned about her memory loss, Laboratory evaluation in June 2017 showed normal TSH, B12, ESR, negative RPR, mild elevated glucose 109 on BMP, I have personally reviewed MRI of the brain in July 2017, moderate generalized atrophy, mild supratentorium small vessel disease,  She lives alone, still drives, but reported she got lost sometimes, today's Mini-Mental status  examination 27 out of 30  UPDATE June 29 2016: She is alone at today's clinical visit, we have personally reviewed MRI of the brain, moderate generalized atrophy, small supratentorium small vessel disease, today she denies significant memory loss,  lives alone at her house, still driving,  Her low back pain has improved, stay at low back, denies radiating pain.  MRI of lumbar spine demonstrate multilevel degenerative changes, moderate bilateral foraminal stenosis at L5-S1, moderate stenosis at L 4-5, prior left L5 laminectomy  UPDATE Mar 22 2017: She complains of worsening low back pain and bilateral foot, distal deep achy pain,  She is now taking gabapentin 655m qid, celebrex 2028mqday, she sleeps well, worsening pain after bearing weight,    REVIEW OF SYSTEMS: Full 14 system review of systems performed and notable only for those listed, all others are neg:  Memory loss   ALLERGIES: Allergies  Allergen Reactions  . Keflex [Cephalexin]     Dizziness  . Lyrica [Pregabalin] Other (See Comments)    "High Fever"   . Oxytetracycline Other (See Comments)    "High Fever" caused by terramycin  . Memantine Rash    HOME MEDICATIONS: Outpatient Medications Prior to Visit  Medication Sig Dispense Refill  . ascorbic acid (VITAMIN C) 1000 MG tablet Take 1,000 mg by mouth daily after lunch.     . Marland Kitchenspirin 81 MG tablet Take 81 mg by mouth daily after lunch.     . calcium citrate-vitamin D (CITRACAL+D) 315-200 MG-UNIT per tablet Take 1 tablet by mouth daily.     . celecoxib (CELEBREX) 200 MG capsule Take 1 capsule (200 mg total) by mouth daily. 90 capsule 0  . estradiol (CLIMARA - DOSED IN MG/24 HR) 0.025 mg/24hr patch PLACE 1 PATCH ONTO THE SKIN ONCE A WEEK. ON SATURDAY 8 patch 4  . famotidine (PEPCID) 20 MG tablet Take 20 mg by mouth daily.    . Marland Kitchenabapentin (NEURONTIN) 600 MG tablet Take 1 tablet (600 mg total) by mouth 4 (four) times daily. 360 tablet 4  . levocetirizine (XYZAL) 5 MG tablet Take 1 tablet daily by mouth.  3  . loratadine (CLARITIN) 10 MG tablet Take 10 mg daily by mouth.    . Multiple Vitamin (MULTIVITAMIN) tablet Take 1 tablet by mouth daily after lunch.     . simvastatin (ZOCOR) 10 MG tablet TAKE 1 TABLET (10 MG  TOTAL) BY MOUTH AT BEDTIME. 90 tablet 0   No facility-administered medications prior to visit.     PAST MEDICAL HISTORY: Past Medical History:  Diagnosis Date  . Arthritis   . Cataract 2007   bilateral cat ext   . Cholecystitis   . Complication of anesthesia Feb 21, 2014   atrial fib after cholecystectomy, lasted a few hours, none since  . Dysrhythmia 02-21-2014   after cholecystectomy had atrial fib for a few hours, none since  . GERD (gastroesophageal reflux disease)    Had endoscopy no history of Barrett's  . Hx of nonmelanoma skin cancer   . Unspecified hereditary and idiopathic peripheral neuropathy     PAST SURGICAL HISTORY: Past Surgical History:  Procedure Laterality Date  . ABDOMINAL HYSTERECTOMY  15 yrs ago   complete  . BREAST CYST ASPIRATION    . CHOLECYSTECTOMY N/A 02/21/2014   Procedure: LAPAROSCOPIC CHOLECYSTECTOMY WITH INTRAOPERATIVE CHOLANGIOGRAM;  Surgeon: JaDoreen SalvageMD;  [facility] Service: General;  Laterality: N/A;  . ENDOSCOPIC RETROGRADE CHOLANGIOPANCREATOGRAPHY (ERCP) WITH PROPOFOL N/A 05/29/2014   Procedure: ENDOSCOPIC RETROGRADE CHOLANGIOPANCREATOGRAPHY (  ERCP) WITH PROPOFOL;  Surgeon: Arta Silence, MD;  Location: [facility];  Service: Endoscopy;  Laterality: N/A;  . ERCP N/A 02/24/2014   Procedure: ENDOSCOPIC RETROGRADE CHOLANGIOPANCREATOGRAPHY (ERCP);  Surgeon: Arta Silence, MD;  Location: [facility]COPY;  Service: Endoscopy;  Laterality: N/A;  . EYE SURGERY Bilateral 2007   both eyes cataracts with lens replacments  . LUMBAR LAMINECTOMY/DECOMPRESSION MICRODISCECTOMY  02/01/2012   Procedure: LUMBAR LAMINECTOMY/DECOMPRESSION MICRODISCECTOMY 2 LEVELS;  Surgeon: Kristeen Miss, MD;  Location: [facility];  Service: Neurosurgery;  Laterality: Left;  Left Lumbar Four-Five Lumbar Five-Sacral One Laminectomies/Foraminotomies/Microscope  . SPYGLASS CHOLANGIOSCOPY N/A 05/29/2014   Procedure: SPYGLASS CHOLANGIOSCOPY;  Surgeon: Arta Silence, MD;  Location:  [facility];  Service: Endoscopy;  Laterality: N/A;  . TONSILLECTOMY  age 36    FAMILY HISTORY: Family History  Problem Relation Age of Onset  . Neurodegenerative disease Mother        Mikey Bussing Drager died at 86  . Alcohol abuse Unknown   . Heart disease Unknown     SOCIAL HISTORY: Social History   Socioeconomic History  . Marital status: Widowed    Spouse name: Gwyndolyn Saxon  . Number of children: Not on file  . Years of education: Not on file  . Highest education level: Not on file  Social Needs  . Financial resource strain: Not on file  . Food insecurity - worry: Not on file  . Food insecurity - inability: Not on file  . Transportation needs - medical: Not on file  . Transportation needs - non-medical: Not on file  Occupational History  . Occupation: Retired    Comment: Therapist, sports  Tobacco Use  . Smoking status: Former Smoker    Packs/day: 0.25    Years: 7.00    Pack years: 1.75    Types: Cigarettes    Last attempt to quit: 04/27/1963    Years since quitting: 53.9  . Smokeless tobacco: Never Used  Substance and Sexual Activity  . Alcohol use: Yes    Alcohol/week: 4.2 oz    Types: 7 Glasses of wine per week    Comment: 1 glass of wine daily  . Drug use: No  . Sexual activity: Yes  Other Topics Concern  . Not on file  Social History Narrative   Retired Therapist, sports    widowed since    Spring  15    [facility]    Regular exercise former smoker   G3P3   From [place] originally         PHYSICAL EXAM  Vitals:   03/22/17 0812  BP: 131/80  Pulse: (!) 56  Weight: 134 lb (60.8 kg)  Height: '5\' 1"'  (1.549 m)   Body mass index is 25.32 kg/m. Gen: NAD, conversant, well nourised, obese, well groomed  Cardiovascular: Regular rate rhythm,  Neck: Supple, no carotid bruit. NEUROLOGICAL EXAM:  MENTAL STATUS: Speech:    Speech is normal; fluent and spontaneous with normal comprehension.  Cognition: Mini-Mental Status Examination 30/30, animal naming 12      Orientation to time, place and person,      Normal Recent and remote memory:       Normal Attention span and concentration     Normal Language, naming, repeating,spontaneous speech     Fund of knowledge   CRANIAL NERVES: CN II: Visual fields are full to confrontation. Pupil equal round reactive to light CN III, IV, VI: extraocular movement are normal. No ptosis. CN V: Facial sensation is intact to pinprick in all 3 divisions  bilaterally.   CN VII: Face is symmetric with normal eye closure and smile. CN VIII: Hearing is normal to rubbing fingers CN IX, X: Palate elevates symmetrically. Phonation is normal. CN XI: Head turning and shoulder shrug are intact CN XII: Tongue is midline with normal movements and no atrophy. Motor: normal bulk and tone, full strength in the BUE, BLE, fine finger movements normal, no pronator drift. No focal weakness Sensory: normal and symmetric to light touch, pinprick, and  Vibration,   Coordination: finger-nose-finger, heel-to-shin bilaterally, no dysmetria Reflexes: Symmetric upper and lower plantar responses were flexor bilaterally. Gait and Station: Mild antalgic cautious gait, no assistive device, Romberg absent  DIAGNOSTIC DATA (LABS, IMAGING, TESTING) - I reviewed patient records, labs, notes, testing and imaging myself where available.  Lab Results  Component Value Date   WBC 7.9 07/16/2016   HGB 12.8 07/16/2016   HCT 38 07/16/2016   MCV 93.8 02/18/2016   PLT 262 07/16/2016      Component Value Date/Time   NA 136 03/04/2017 0949   NA 133 (A) 07/16/2016   K 4.2 03/04/2017 0949   CL 101 03/04/2017 0949   CO2 28 03/04/2017 0949   GLUCOSE 96 03/04/2017 0949   BUN 15 03/04/2017 0949   BUN 16 07/16/2016   CREATININE 0.65 03/04/2017 0949   CALCIUM 9.2 03/04/2017 0949   PROT 6.3 03/04/2017 0949   ALBUMIN 4.0 03/04/2017 0949   AST 16 03/04/2017 0949   ALT 13 03/04/2017 0949   ALKPHOS 46 03/04/2017 0949   BILITOT 0.8 03/04/2017 0949    GFRNONAA 81 10/15/2015 1432   GFRAA 94 10/15/2015 1432   Lab Results  Component Value Date   CHOL 185 03/04/2017   HDL 75.10 03/04/2017   LDLCALC 87 03/04/2017   LDLDIRECT 153 02/12/2009   TRIG 116.0 03/04/2017   CHOLHDL 2 03/04/2017    Lab Results  Component Value Date   VITAMINB12 495 10/15/2015   Lab Results  Component Value Date   TSH 0.89 07/16/2016      ASSESSMENT AND PLAN HALYNN REITANO is a 83y.o. female Mild cognitive impairment  Mini-Mental Status Examination was 27/30    Evidence of at least moderate generalized atrophy, mild supratentorium small vessel disease  I have encouraged her continue have moderate exercise,  Keep daily aspirin  She could not tolerate Namenda 10 mg twice a day   Chronic low back pain, lumbar radiculopathy, mild gait abnormality  She has been on gabapentin 600 mg 4 times a day   She complains of worsening bilateral distal leg paresthesia, deep achy pain, Cymbalta 30 mg every morning  Marcial Pacas, M.D. Ph.D.  [facility] [place], [place] [address] Phone: [phone] Fax:      [phone]  CC: Burnis Medin, MD

## 2017-03-23 ENCOUNTER — Telehealth: Payer: Self-pay | Admitting: Neurology

## 2017-03-23 NOTE — Telephone Encounter (Signed)
Spoke to patient - she is aware that duloxetine is being prescribed for her back and leg pain.  She is agreeable to try the medication.

## 2017-03-23 NOTE — Telephone Encounter (Signed)
Pt states her pharmacist informed her that DULoxetine (CYMBALTA) 30 MG capsule is mostly for depression and she is not suffering from depression.  Pt is asking for a call please

## 2017-04-14 ENCOUNTER — Other Ambulatory Visit: Payer: Self-pay | Admitting: *Deleted

## 2017-04-14 MED ORDER — DULOXETINE HCL 30 MG PO CPEP: 30.0000 mg | ORAL_CAPSULE | Freq: Every day | ORAL | 3 refills | Status: DC

## 2017-04-20 ENCOUNTER — Other Ambulatory Visit: Payer: Self-pay | Admitting: *Deleted

## 2017-04-20 MED ORDER — GABAPENTIN 600 MG PO TABS: 600.0000 mg | ORAL_TABLET | Freq: Four times a day (QID) | ORAL | 4 refills | Status: DC

## 2017-05-02 ENCOUNTER — Ambulatory Visit (INDEPENDENT_AMBULATORY_CARE_PROVIDER_SITE_OTHER): Payer: Medicare Other | Admitting: Internal Medicine

## 2017-05-02 ENCOUNTER — Encounter: Payer: Self-pay | Admitting: Internal Medicine

## 2017-05-02 VITALS — BP 124/68 | HR 84 | Temp 98.1°F | Wt 132.4 lb

## 2017-05-02 DIAGNOSIS — M961 Postlaminectomy syndrome, not elsewhere classified: Secondary | ICD-10-CM | POA: Diagnosis not present

## 2017-05-02 DIAGNOSIS — J069 Acute upper respiratory infection, unspecified: Secondary | ICD-10-CM | POA: Diagnosis not present

## 2017-05-02 DIAGNOSIS — M47816 Spondylosis without myelopathy or radiculopathy, lumbar region: Secondary | ICD-10-CM | POA: Diagnosis not present

## 2017-05-02 NOTE — Patient Instructions (Signed)
You r exam is reassuring.  mucinex dm at night   Plain mucinex in day and hot  Tea  Fluids  .    Stay hydrated   And let us know if not continue to get better    In the next few days.

## 2017-05-02 NOTE — Progress Notes (Signed)
Chief Complaint  Patient presents with  . Acute Visit    Cough, scartchy throat, congestion, body aches, runny nose and fatigue x 10 days- worsening. Pt denied fever.       HPI: Susan Ford 84 y.o. SDA   Went to  md for x mas and had cough  And then getting worse  Over time. And last night felt horrible     Better this apm .  No fever  Mucous is so dark yellow out of nose and thick .     D/c   Cough not increase at night  .  But does when she lays down    no sob and no wheezing.  Fever  But did feel tired this am  ROS: See pertinent positives and negatives per HPI. No rmeds no face pain   Past Medical History:  Diagnosis Date  . Arthritis   . Cataract 2007   bilateral cat ext   . Cholecystitis   . Complication of anesthesia Feb 21, 2014   atrial fib after cholecystectomy, lasted a few hours, none since  . Dysrhythmia 02-21-2014   after cholecystectomy had atrial fib for a few hours, none since  . GERD (gastroesophageal reflux disease)    Had endoscopy no history of Barrett's  . Hx of nonmelanoma skin cancer   . Unspecified hereditary and idiopathic peripheral neuropathy     Family History  Problem Relation Age of Onset  . Neurodegenerative disease Mother        Mikey Bussing Drager died at 36  . Alcohol abuse Unknown   . Heart disease Unknown     Social History   Socioeconomic History  . Marital status: Widowed    Spouse name: Gwyndolyn Saxon  . Number of children: None  . Years of education: None  . Highest education level: None  Social Needs  . Financial resource strain: None  . Food insecurity - worry: None  . Food insecurity - inability: None  . Transportation needs - medical: None  . Transportation needs - non-medical: None  Occupational History  . Occupation: Retired    Comment: Therapist, sports  Tobacco Use  . Smoking status: Former Smoker    Packs/day: 0.25    Years: 7.00    Pack years: 1.75    Types: Cigarettes    Last attempt to quit: 04/27/1963    Years since  quitting: 54.0  . Smokeless tobacco: Never Used  Substance and Sexual Activity  . Alcohol use: Yes    Alcohol/week: 4.2 oz    Types: 7 Glasses of wine per week    Comment: 1 glass of wine daily  . Drug use: No  . Sexual activity: Yes  Other Topics Concern  . None  Social History Narrative   Retired Therapist, sports    widowed since    Spring  15    [facility] of [facility]    Regular exercise former smoker   G3P3   From [place] originally        Outpatient Medications Prior to Visit  Medication Sig Dispense Refill  . ascorbic acid (VITAMIN C) 1000 MG tablet Take 1,000 mg by mouth daily after lunch.     Marland Kitchen aspirin 81 MG tablet Take 81 mg by mouth daily after lunch.     . calcium citrate-vitamin D (CITRACAL+D) 315-200 MG-UNIT per tablet Take 1 tablet by mouth daily.     . celecoxib (CELEBREX) 200 MG capsule Take 1 capsule (200 mg total) by mouth daily. [place]  capsule 0  . estradiol (CLIMARA - DOSED IN MG/24 HR) 0.025 mg/24hr patch PLACE 1 PATCH ONTO THE SKIN ONCE A WEEK. ON SATURDAY 8 patch 4  . famotidine (PEPCID) 20 MG tablet Take 20 mg by mouth daily.    Marland Kitchen gabapentin (NEURONTIN) 600 MG tablet Take 1 tablet (600 mg total) by mouth 4 (four) times daily. 360 tablet 4  . levocetirizine (XYZAL) 5 MG tablet Take 1 tablet daily by mouth.  3  . loratadine (CLARITIN) 10 MG tablet Take 10 mg daily by mouth.    . Multiple Vitamin (MULTIVITAMIN) tablet Take 1 tablet by mouth daily after lunch.     . simvastatin (ZOCOR) 10 MG tablet TAKE 1 TABLET (10 MG TOTAL) BY MOUTH AT BEDTIME. 90 tablet 0  . DULoxetine (CYMBALTA) 30 MG capsule Take 1 capsule (30 mg total) by mouth daily. (Patient not taking: Reported on 05/02/2017) 90 capsule 3   No facility-administered medications prior to visit.      EXAM:  BP 124/68 (BP Location: Left Arm, Patient Position: Sitting, Cuff Size: Normal)   Pulse 84   Temp 98.1 F (36.7 C) (Oral)   Wt 132 lb 6.4 oz (60.1 kg)   SpO2 97%   BMI 25.02 kg/m   Body mass index is 25.02  kg/m.  GENERAL: vitals reviewed and listed above, alert, oriented, appears well hydrated and in no acute distress congestion  Looks well non toxic HEENT: atraumatic, conjunctiva  clear, no obvious abnormalities on inspection of external nose and ears tm clearnares cong  Neg sinus pain OP : no lesion edema or exudate  NECK: no obvious masses on inspection palpation  LUNGS: clear to auscultation bilaterally, no wheezes, rales or rhonchi, good air movement CV: HRRR, no clubbing cyanosis or  peripheral edema nl cap refill  MS: moves all extremities without noticeable focal  abnormality PSYCH: pleasant and cooperative, no obvious depression or anxiety  ASSESSMENT AND PLAN:  Discussed the following assessment and plan:  Acute upper respiratory infection of multiple sites Appears uncomplicated by exam risk factors  Is age no hxof underlying sig pulm disease otherwise  I f    persistent or progressive consider rx for sinusisit  -Patient advised to return or notify health care team  if symptoms worsen ,persist or new concerns arise.  Patient Instructions  You r exam is reassuring.  mucinex dm at night   Plain mucinex in day and hot  Tea  Fluids  .    Stay hydrated   And let us know if not continue to get better    In the next few days.        Standley Brooking. Panosh M.D.

## 2017-05-10 NOTE — Progress Notes (Addendum)
Subjective:   Susan Ford is a 84 y.o. female who presents for Medicare Annual (Subsequent) preventive examination.  Psychosocial  Married 2nd time  Married x 16 years  Widowed since 2015 Retired Therapist, sports - pediatric nurse Pain in back due to bulging disc Dr. Brien Few will try a procedure next week  Dr. Ellene Route neurosurgeon  Diet Breakfast - slim fast Lunch; have cup of yogurt; couple of crackers Supper; usually has pasta with protein with salad Sometimes eats a vegetables. Pineapple salad   BMI 22   Exercise Maintaining the home Volunteers at church Has to vacuum 5 minute intervals  Mammogram 05/2016 Bone density 10/21016 - -0.9 Does not want to repeat  Educated regarding the shingrix vaccine but states she rec'd it and will get the records Continues with mammogram's     Cardiac Risk Factors include: advanced age (>20men, >81 women)  There are no preventive care reminders to display for this patient.        Objective:     Vitals: BP 110/70   Pulse 73   Ht 5\' 4"  (1.626 m)   Wt 133 lb 7 oz (60.5 kg)   SpO2 97%   BMI 22.90 kg/m   Body mass index is 22.9 kg/m.  Advanced Directives 05/11/2017 05/11/2017 05/29/2014 05/21/2014 02/21/2014 01/04/2014 01/26/2012  Does Patient Have a Medical Advance Directive? Yes Yes Yes Yes Yes Yes Patient has advance directive, copy not in chart  Type of Advance Directive - - [place];Living will [place];Living will Healthcare Power of [place] of [place];Living will  Does patient want to make changes to medical advance directive? - - - - No - Patient declined - -  Copy of [place] in Chart? - - No - copy requested No - copy requested Yes No - copy requested -    Tobacco Social History   Tobacco Use  Smoking Status Former Smoker  . Packs/day: 0.25  . Years: 7.00  . Pack years: 1.75  . Types: Cigarettes  . Last attempt  to quit: 04/27/1963  . Years since quitting: 54.0  Smokeless Tobacco Never Used     Counseling given: Yes   Clinical Intake:     Past Medical History:  Diagnosis Date  . Arthritis   . Cataract 2007   bilateral cat ext   . Cholecystitis   . Complication of anesthesia Feb 21, 2014   atrial fib after cholecystectomy, lasted a few hours, none since  . Dysrhythmia 02-21-2014   after cholecystectomy had atrial fib for a few hours, none since  . GERD (gastroesophageal reflux disease)    Had endoscopy no history of Barrett's  . Hx of nonmelanoma skin cancer   . Unspecified hereditary and idiopathic peripheral neuropathy    Past Surgical History:  Procedure Laterality Date  . ABDOMINAL HYSTERECTOMY  15 yrs ago   complete  . BREAST CYST ASPIRATION    . CHOLECYSTECTOMY N/A 02/21/2014   Procedure: LAPAROSCOPIC CHOLECYSTECTOMY WITH INTRAOPERATIVE CHOLANGIOGRAM;  Surgeon: Doreen Salvage, MD;  Location: [facility];  Service: General;  Laterality: N/A;  . ENDOSCOPIC RETROGRADE CHOLANGIOPANCREATOGRAPHY (ERCP) WITH PROPOFOL N/A 05/29/2014   Procedure: ENDOSCOPIC RETROGRADE CHOLANGIOPANCREATOGRAPHY (ERCP) WITH PROPOFOL;  Surgeon: Arta Silence, MD;  Location: [facility];  Service: Endoscopy;  Laterality: N/A;  . ERCP N/A 02/24/2014   Procedure: ENDOSCOPIC RETROGRADE CHOLANGIOPANCREATOGRAPHY (ERCP);  Surgeon: Arta Silence, MD;  Location[facility];  Service: Endoscopy;  Laterality: N/A;  .  EYE SURGERY Bilateral 2007   both eyes cataracts with lens replacments  . LUMBAR LAMINECTOMY/DECOMPRESSION MICRODISCECTOMY  02/01/2012   Procedure: LUMBAR LAMINECTOMY/DECOMPRESSION MICRODISCECTOMY 2 LEVELS;  Surgeon: Kristeen Miss, MD;  Location: [facility];  Service: Neurosurgery;  Laterality: Left;  Left Lumbar Four-Five Lumbar Five-Sacral One Laminectomies/Foraminotomies/Microscope  . SPYGLASS CHOLANGIOSCOPY N/A 05/29/2014   Procedure: SPYGLASS CHOLANGIOSCOPY;  Surgeon: Arta Silence, MD;  Location: [facility];   Service: Endoscopy;  Laterality: N/A;  . TONSILLECTOMY  age 33   Family History  Problem Relation Age of Onset  . Neurodegenerative disease Mother        Mikey Bussing Drager died at 83  . Alcohol abuse Unknown   . Heart disease Unknown    Social History   Socioeconomic History  . Marital status: Widowed    Spouse name: Gwyndolyn Saxon  . Number of children: Not on file  . Years of education: Not on file  . Highest education level: Not on file  Social Needs  . Financial resource strain: Not on file  . Food insecurity - worry: Not on file  . Food insecurity - inability: Not on file  . Transportation needs - medical: Not on file  . Transportation needs - non-medical: Not on file  Occupational History  . Occupation: Retired    Comment: Therapist, sports  Tobacco Use  . Smoking status: Former Smoker    Packs/day: 0.25    Years: 7.00    Pack years: 1.75    Types: Cigarettes    Last attempt to quit: 04/27/1963    Years since quitting: 54.0  . Smokeless tobacco: Never Used  Substance and Sexual Activity  . Alcohol use: Yes    Alcohol/week: 4.2 oz    Types: 7 Glasses of wine per week    Comment: 1 glass of wine daily  . Drug use: No  . Sexual activity: Yes  Other Topics Concern  . Not on file  Social History Narrative   Retired Therapist, sports    widowed since    Spring  15    [facility] of 2    Regular exercise former smoker   G3P3   From [place] originally        Outpatient Encounter Medications as of 05/11/2017  Medication Sig  . ascorbic acid (VITAMIN C) 1000 MG tablet Take 1,000 mg by mouth daily after lunch.   Marland Kitchen aspirin 81 MG tablet Take 81 mg by mouth daily after lunch.   . calcium citrate-vitamin D (CITRACAL+D) 315-200 MG-UNIT per tablet Take 1 tablet by mouth daily.   . celecoxib (CELEBREX) 200 MG capsule Take 1 capsule (200 mg total) by mouth daily.  Marland Kitchen estradiol (CLIMARA - DOSED IN MG/24 HR) 0.025 mg/24hr patch PLACE 1 PATCH ONTO THE SKIN ONCE A WEEK. ON SATURDAY  . famotidine (PEPCID) 20 MG tablet Take 20  mg by mouth daily.  Marland Kitchen gabapentin (NEURONTIN) 600 MG tablet Take 1 tablet (600 mg total) by mouth 4 (four) times daily.  Marland Kitchen levocetirizine (XYZAL) 5 MG tablet Take 1 tablet daily by mouth.  . loratadine (CLARITIN) 10 MG tablet Take 10 mg daily by mouth.  . Multiple Vitamin (MULTIVITAMIN) tablet Take 1 tablet by mouth daily after lunch.   . simvastatin (ZOCOR) 10 MG tablet TAKE 1 TABLET (10 MG TOTAL) BY MOUTH AT BEDTIME.   No facility-administered encounter medications on file as of 05/11/2017.     Activities of Daily Living In your present state of health, do you have any difficulty performing the following activities:  05/11/2017  Hearing? N  Vision? N  Difficulty concentrating or making decisions? N  Walking or climbing stairs? N  Dressing or bathing? N  Doing errands, shopping? N  Preparing Food and eating ? N  Using the Toilet? N  In the past six months, have you accidently leaked urine? N  Do you have problems with loss of bowel control? N  Managing your Medications? N  Managing your Finances? N  Housekeeping or managing your Housekeeping? N  Some recent data might be hidden    Patient Care Team: Panosh, Standley Brooking, MD as PCP - General [place], Amy, MD as Consulting Physician (Dermatology) Marcial Pacas, MD as Consulting Physician (Neurology) Kristeen Miss, MD as Consulting Physician (Neurosurgery)    Assessment:   This is a routine wellness examination for [place].  Exercise Activities and Dietary recommendations Current Exercise Habits: Home exercise routine, Time (Minutes): 60(general exercise), Frequency (Times/Week): 5, Weekly Exercise (Minutes/Week): 300, Intensity: Mild  Goals    . Patient Stated     To maintain your health     . Patient Stated     Stay engaged with others;        Fall Risk Fall Risk  05/11/2017 02/18/2016 01/29/2015  Falls in the past year? No No No   Home is one level, like a ranch Garage under home so takes stairs up  Uses the lift now  [employer]	[place] and working in the yard  Good support in the neighborhood Has lived her x 50 years and does not want to move out of the area   Has neuropathy in legs; feels her feet  Aching in ankles  Sons come in to visit  dtr in [place]  Sister is [place], [place]   Depression Screen PHQ 2/9 Scores 05/11/2017 02/18/2016 01/29/2015  PHQ - 2 Score 0 0 0     Cognitive Function MMSE - Mini Mental State Exam 05/11/2017 06/29/2016 12/31/2015  Not completed: (No Data) - -  Orientation to time - 5 5  Orientation to Place - 5 5  Registration - 3 3  Attention/ Calculation - 5 5  Recall - 3 0  Language- name 2 objects - 2 2  Language- repeat - 1 1  Language- follow 3 step command - 3 3  Language- read & follow direction - 1 1  Write a sentence - 1 1  Copy design - 1 1  Total score - 30 27     Ad8 score reviewed for issues:  Issues making decisions:  Less interest in hobbies / activities:  Repeats questions, stories (family complaining):  Trouble using ordinary gadgets (microwave, computer, phone):  Forgets the month or year:   Mismanaging finances:   Remembering appts:  Daily problems with thinking and/or memory: Ad8 score is=0  States at times she will forget what apt she has next week. Mother died of neuromuscular disease  Had a discussion about memory; no failures of independent living and a very good historian.     Immunization History  Administered Date(s) Administered  . Influenza Split 02/25/2012  . Influenza, High Dose Seasonal PF 01/13/2016, 01/19/2017  . Influenza-Unspecified 01/31/2014  . Pneumococcal Conjugate-13 10/23/2013, 03/11/2014  . Pneumococcal Polysaccharide-23 02/18/2016  . Td 05/15/2010  . Tdap 11/08/2011  . Zoster 04/09/2014    Qualifies for Shingles Vaccine?completed these and will get the record  Screening Tests Health Maintenance  Topic Date Due  . TETANUS/TDAP  11/07/2021  . INFLUENZA VACCINE  Completed  . DEXA SCAN  Completed  .  PNA vac Low Risk Adult  Completed        Plan:      PCP Notes   Health Maintenance Has taken her shingrix but will confirm dates with pharmacy   Bone density 10/21016   -0.9 Does not want to repeat  Exercise limited due to back pain but having an injection next week   Abnormal Screens  none  Referrals  none  Patient concerns; none  Nurse Concerns; As noted  Next PCP apt 03/07/18 or acute visit      I have personally reviewed and noted the following in the patient's chart:   . Medical and social history . Use of alcohol, tobacco or illicit drugs  . Current medications and supplements . Functional ability and status . Nutritional status . Physical activity . Advanced directives . List of other physicians . Hospitalizations, surgeries, and ER visits in previous 12 months . Vitals . Screenings to include cognitive, depression, and falls . Referrals and appointments  In addition, I have reviewed and discussed with patient certain preventive protocols, quality metrics, and best practice recommendations. A written personalized care plan for preventive services as well as general preventive health recommendations were provided to patient.     EVQWQ,VLDKC, RN  05/11/2017   Above noted reviewed and agree. Shanon Ace, MD

## 2017-05-11 ENCOUNTER — Ambulatory Visit (INDEPENDENT_AMBULATORY_CARE_PROVIDER_SITE_OTHER): Payer: Medicare Other

## 2017-05-11 VITALS — BP 110/70 | HR 73 | Ht 64.0 in | Wt 133.4 lb

## 2017-05-11 DIAGNOSIS — Z0000 Encounter for general adult medical examination without abnormal findings: Secondary | ICD-10-CM

## 2017-05-11 NOTE — Patient Instructions (Addendum)
Susan Ford , Thank you for taking time to come for your Medicare Wellness Visit. I appreciate your ongoing commitment to your health goals. Please review the following plan we discussed and let me know if I can assist you in the future.   Shingrix is a vaccine for the prevention of Shingles in Adults 50 and older.  If you are on Medicare, you can request a prescription from your doctor to be filled at a pharmacy.  Please check with your benefits regarding applicable copays or out of pocket expenses.  The Shingrix is given in 2 vaccines approx 8 weeks apart. You must receive the 2nd dose prior to 6 months from receipt of the firs Will fup on dates of your shingrix so we can document in the record    These are the goals we discussed: Goals    . Patient Stated     To maintain your health     . Patient Stated     Stay engaged with others;        This is a list of the screening recommended for you and due dates:  Health Maintenance  Topic Date Due  . Tetanus Vaccine  11/07/2021  . Flu Shot  Completed  . DEXA scan (bone density measurement)  Completed  . Pneumonia vaccines  Completed    Prevention of falls: Remove rugs or any tripping hazards in the home Use Non slip mats in bathtubs and showers Placing grab bars next to the toilet and or shower Placing handrails on both sides of the stair way Adding extra lighting in the home.   Personal safety issues reviewed:  1. Consider starting a community watch program per [facility] 2.  Changes batteries is smoke detector and/or carbon monoxide detector  3.  If you have firearms; keep them in a safe place 4.  Wear protection when in the sun; Always wear sunscreen or a hat; It is good to have your doctor check your skin annually or review any new areas of concern 5. Driving safety; Keep in the right lane; stay 3 car lengths behind the car in front of you on the highway; look 3 times prior to pulling out; carry your cell phone  everywhere you go!    Learn about the Yellow Dot program:  The program allows first responders at your emergency to have access to who your physician is, as well as your medications and medical conditions.  Citizens requesting the Yellow Dot Packages should contact Master Corporal Nunzio Cobbs at the [facility] [phone] for the first week of the program and beginning the week after Easter citizens should contact their Scientist, physiological.      Fall Prevention in the Home Falls can cause injuries. They can happen to people of all ages. There are many things you can do to make your home safe and to help prevent falls. What can I do on the outside of my home?  Regularly fix the edges of walkways and driveways and fix any cracks.  Remove anything that might make you trip as you walk through a door, such as a raised step or threshold.  Trim any bushes or trees on the path to your home.  Use bright outdoor lighting.  Clear any walking paths of anything that might make someone trip, such as rocks or tools.  Regularly check to see if handrails are loose or broken. Make sure that both sides of any steps have handrails.  Any raised decks and porches should have guardrails on the edges.  Have any leaves, snow, or ice cleared regularly.  Use sand or salt on walking paths during winter.  Clean up any spills in your garage right away. This includes oil or grease spills. What can I do in the bathroom?  Use night lights.  Install grab bars by the toilet and in the tub and shower. Do not use towel bars as grab bars.  Use non-skid mats or decals in the tub or shower.  If you need to sit down in the shower, use a plastic, non-slip stool.  Keep the floor dry. Clean up any water that spills on the floor as soon as it happens.  Remove soap buildup in the tub or shower regularly.  Attach bath mats securely with double-sided non-slip rug tape.  Do not  have throw rugs and other things on the floor that can make you trip. What can I do in the bedroom?  Use night lights.  Make sure that you have a light by your bed that is easy to reach.  Do not use any sheets or blankets that are too big for your bed. They should not hang down onto the floor.  Have a firm chair that has side arms. You can use this for support while you get dressed.  Do not have throw rugs and other things on the floor that can make you trip. What can I do in the kitchen?  Clean up any spills right away.  Avoid walking on wet floors.  Keep items that you use a lot in easy-to-reach places.  If you need to reach something above you, use a strong step stool that has a grab bar.  Keep electrical cords out of the way.  Do not use floor polish or wax that makes floors slippery. If you must use wax, use non-skid floor wax.  Do not have throw rugs and other things on the floor that can make you trip. What can I do with my stairs?  Do not leave any items on the stairs.  Make sure that there are handrails on both sides of the stairs and use them. Fix handrails that are broken or loose. Make sure that handrails are as long as the stairways.  Check any carpeting to make sure that it is firmly attached to the stairs. Fix any carpet that is loose or worn.  Avoid having throw rugs at the top or bottom of the stairs. If you do have throw rugs, attach them to the floor with carpet tape.  Make sure that you have a light switch at the top of the stairs and the bottom of the stairs. If you do not have them, ask someone to add them for you. What else can I do to help prevent falls?  Wear shoes that: ? Do not have high heels. ? Have rubber bottoms. ? Are comfortable and fit you well. ? Are closed at the toe. Do not wear sandals.  If you use a stepladder: ? Make sure that it is fully opened. Do not climb a closed stepladder. ? Make sure that both sides of the stepladder are  locked into place. ? Ask someone to hold it for you, if possible.  Clearly mark and make sure that you can see: ? Any grab bars or handrails. ? First and last steps. ? Where the edge of each step is.  Use tools that help you move around (mobility aids) if  they are needed. These include: ? Canes. ? Walkers. ? Scooters. ? Crutches.  Turn on the lights when you go into a dark area. Replace any light bulbs as soon as they burn out.  Set up your furniture so you have a clear path. Avoid moving your furniture around.  If any of your floors are uneven, fix them.  If there are any pets around you, be aware of where they are.  Review your medicines with your doctor. Some medicines can make you feel dizzy. This can increase your chance of falling. Ask your doctor what other things that you can do to help prevent falls. This information is not intended to replace advice given to you by your health care provider. Make sure you discuss any questions you have with your health care provider. Document Released: 02/06/2009 Document Revised: 09/18/2015 Document Reviewed: 05/17/2014 Elsevier Interactive Patient Education  2018 [employer] Maintenance, Female Adopting a healthy lifestyle and getting preventive care can go a long way to promote health and wellness. Talk with your health care provider about what schedule of regular examinations is right for you. This is a good chance for you to check in with your provider about disease prevention and staying healthy. In between checkups, there are plenty of things you can do on your own. Experts have done a lot of research about which lifestyle changes and preventive measures are most likely to keep you healthy. Ask your health care provider for more information. Weight and diet Eat a healthy diet  Be sure to include plenty of vegetables, fruits, low-fat dairy products, and lean protein.  Do not eat a lot of foods high in solid fats, added  sugars, or salt.  Get regular exercise. This is one of the most important things you can do for your health. ? Most adults should exercise for at least 150 minutes each week. The exercise should increase your heart rate and make you sweat (moderate-intensity exercise). ? Most adults should also do strengthening exercises at least twice a week. This is in addition to the moderate-intensity exercise.  Maintain a healthy weight  Body mass index (BMI) is a measurement that can be used to identify possible weight problems. It estimates body fat based on height and weight. Your health care provider can help determine your BMI and help you achieve or maintain a healthy weight.  For females 76 years of age and older: ? A BMI below 18.5 is considered underweight. ? A BMI of 18.5 to 24.9 is normal. ? A BMI of 25 to 29.9 is considered overweight. ? A BMI of 30 and above is considered obese.  Watch levels of cholesterol and blood lipids  You should start having your blood tested for lipids and cholesterol at 82 years of age, then have this test every 5 years.  You may need to have your cholesterol levels checked more often if: ? Your lipid or cholesterol levels are high. ? You are older than 82 years of age. ? You are at high risk for heart disease.  Cancer screening Lung Cancer  Lung cancer screening is recommended for adults 34-35 years old who are at high risk for lung cancer because of a history of smoking.  A yearly low-dose CT scan of the lungs is recommended for people who: ? Currently smoke. ? Have quit within the past 15 years. ? Have at least a 30-pack-year history of smoking. A pack year is smoking an average of one pack of  cigarettes a day for 1 year.  Yearly screening should continue until it has been 15 years since you quit.  Yearly screening should stop if you develop a health problem that would prevent you from having lung cancer treatment.  Breast Cancer  Practice breast  self-awareness. This means understanding how your breasts normally appear and feel.  It also means doing regular breast self-exams. Let your health care provider know about any changes, no matter how small.  If you are in your 20s or 30s, you should have a clinical breast exam (CBE) by a health care provider every 1-3 years as part of a regular health exam.  If you are 41 or older, have a CBE every year. Also consider having a breast X-ray (mammogram) every year.  If you have a family history of breast cancer, talk to your health care provider about genetic screening.  If you are at high risk for breast cancer, talk to your health care provider about having an MRI and a mammogram every year.  Breast cancer gene (BRCA) assessment is recommended for women who have family members with BRCA-related cancers. BRCA-related cancers include: ? Breast. ? Ovarian. ? Tubal. ? Peritoneal cancers.  Results of the assessment will determine the need for genetic counseling and BRCA1 and BRCA2 testing.  Cervical Cancer Your health care provider may recommend that you be screened regularly for cancer of the pelvic organs (ovaries, uterus, and vagina). This screening involves a pelvic examination, including checking for microscopic changes to the surface of your cervix (Pap test). You may be encouraged to have this screening done every 3 years, beginning at age 32.  For women ages 17-65, health care providers may recommend pelvic exams and Pap testing every 3 years, or they may recommend the Pap and pelvic exam, combined with testing for human papilloma virus (HPV), every 5 years. Some types of HPV increase your risk of cervical cancer. Testing for HPV may also be done on women of any age with unclear Pap test results.  Other health care providers may not recommend any screening for nonpregnant women who are considered low risk for pelvic cancer and who do not have symptoms. Ask your health care provider if a  screening pelvic exam is right for you.  If you have had past treatment for cervical cancer or a condition that could lead to cancer, you need Pap tests and screening for cancer for at least 20 years after your treatment. If Pap tests have been discontinued, your risk factors (such as having a new sexual partner) need to be reassessed to determine if screening should resume. Some women have medical problems that increase the chance of getting cervical cancer. In these cases, your health care provider may recommend more frequent screening and Pap tests.  Colorectal Cancer  This type of cancer can be detected and often prevented.  Routine colorectal cancer screening usually begins at 82 years of age and continues through 82 years of age.  Your health care provider may recommend screening at an earlier age if you have risk factors for colon cancer.  Your health care provider may also recommend using home test kits to check for hidden blood in the stool.  A small camera at the end of a tube can be used to examine your colon directly (sigmoidoscopy or colonoscopy). This is done to check for the earliest forms of colorectal cancer.  Routine screening usually begins at age 45.  Direct examination of the colon should be repeated every  5-10 years through 82 years of age. However, you may need to be screened more often if early forms of precancerous polyps or small growths are found.  Skin Cancer  Check your skin from head to toe regularly.  Tell your health care provider about any new moles or changes in moles, especially if there is a change in a mole's shape or color.  Also tell your health care provider if you have a mole that is larger than the size of a pencil eraser.  Always use sunscreen. Apply sunscreen liberally and repeatedly throughout the day.  Protect yourself by wearing long sleeves, pants, a wide-brimmed hat, and sunglasses whenever you are outside.  Heart disease, diabetes, and  high blood pressure  High blood pressure causes heart disease and increases the risk of stroke. High blood pressure is more likely to develop in: ? People who have blood pressure in the high end of the normal range (130-139/85-89 mm Hg). ? People who are overweight or obese. ? People who are African American.  If you are 59-49 years of age, have your blood pressure checked every 3-5 years. If you are 8 years of age or older, have your blood pressure checked every year. You should have your blood pressure measured twice-once when you are at a hospital or clinic, and once when you are not at a hospital or clinic. Record the average of the two measurements. To check your blood pressure when you are not at a hospital or clinic, you can use: ? An automated blood pressure machine at a pharmacy. ? A home blood pressure monitor.  If you are between 54 years and 32 years old, ask your health care provider if you should take aspirin to prevent strokes.  Have regular diabetes screenings. This involves taking a blood sample to check your fasting blood sugar level. ? If you are at a normal weight and have a low risk for diabetes, have this test once every three years after 82 years of age. ? If you are overweight and have a high risk for diabetes, consider being tested at a younger age or more often. Preventing infection Hepatitis B  If you have a higher risk for hepatitis B, you should be screened for this virus. You are considered at high risk for hepatitis B if: ? You were born in a country where hepatitis B is common. Ask your health care provider which countries are considered high risk. ? Your parents were born in a high-risk country, and you have not been immunized against hepatitis B (hepatitis B vaccine). ? You have HIV or AIDS. ? You use needles to inject street drugs. ? You live with someone who has hepatitis B. ? You have had sex with someone who has hepatitis B. ? You get hemodialysis  treatment. ? You take certain medicines for conditions, including cancer, organ transplantation, and autoimmune conditions.  Hepatitis C  Blood testing is recommended for: ? Everyone born from [birth year] through 1963. ? Anyone with known risk factors for hepatitis C.  Sexually transmitted infections (STIs)  You should be screened for sexually transmitted infections (STIs) including gonorrhea and chlamydia if: ? You are sexually active and are younger than 82 years of age. ? You are older than 82 years of age and your health care provider tells you that you are at risk for this type of infection. ? Your sexual activity has changed since you were last screened and you are at an increased risk for chlamydia or  gonorrhea. Ask your health care provider if you are at risk.  If you do not have HIV, but are at risk, it may be recommended that you take a prescription medicine daily to prevent HIV infection. This is called pre-exposure prophylaxis (PrEP). You are considered at risk if: ? You are sexually active and do not regularly use condoms or know the HIV status of your partner(s). ? You take drugs by injection. ? You are sexually active with a partner who has HIV.  Talk with your health care provider about whether you are at high risk of being infected with HIV. If you choose to begin PrEP, you should first be tested for HIV. You should then be tested every 3 months for as long as you are taking PrEP. Pregnancy  If you are premenopausal and you may become pregnant, ask your health care provider about preconception counseling.  If you may become pregnant, take 400 to 800 micrograms (mcg) of folic acid every day.  If you want to prevent pregnancy, talk to your health care provider about birth control (contraception). Osteoporosis and menopause  Osteoporosis is a disease in which the bones lose minerals and strength with aging. This can result in serious bone fractures. Your risk for osteoporosis  can be identified using a bone density scan.  If you are 51 years of age or older, or if you are at risk for osteoporosis and fractures, ask your health care provider if you should be screened.  Ask your health care provider whether you should take a calcium or vitamin D supplement to lower your risk for osteoporosis.  Menopause may have certain physical symptoms and risks.  Hormone replacement therapy may reduce some of these symptoms and risks. Talk to your health care provider about whether hormone replacement therapy is right for you. Follow these instructions at home:  Schedule regular health, dental, and eye exams.  Stay current with your immunizations.  Do not use any tobacco products including cigarettes, chewing tobacco, or electronic cigarettes.  If you are pregnant, do not drink alcohol.  If you are breastfeeding, limit how much and how often you drink alcohol.  Limit alcohol intake to no more than 1 drink per day for nonpregnant women. One drink equals 12 ounces of beer, 5 ounces of wine, or 1 ounces of hard liquor.  Do not use street drugs.  Do not share needles.  Ask your health care provider for help if you need support or information about quitting drugs.  Tell your health care provider if you often feel depressed.  Tell your health care provider if you have ever been abused or do not feel safe at home. This information is not intended to replace advice given to you by your health care provider. Make sure you discuss any questions you have with your health care provider. Document Released: 10/26/2010 Document Revised: 09/18/2015 Document Reviewed: 01/14/2015 Elsevier Interactive Patient Education  [employer].

## 2017-05-18 DIAGNOSIS — R030 Elevated blood-pressure reading, without diagnosis of hypertension: Secondary | ICD-10-CM | POA: Diagnosis not present

## 2017-05-18 DIAGNOSIS — M47816 Spondylosis without myelopathy or radiculopathy, lumbar region: Secondary | ICD-10-CM | POA: Diagnosis not present

## 2017-05-23 DIAGNOSIS — L298 Other pruritus: Secondary | ICD-10-CM | POA: Diagnosis not present

## 2017-05-23 DIAGNOSIS — L853 Xerosis cutis: Secondary | ICD-10-CM | POA: Diagnosis not present

## 2017-05-23 DIAGNOSIS — Z85828 Personal history of other malignant neoplasm of skin: Secondary | ICD-10-CM | POA: Diagnosis not present

## 2017-05-23 DIAGNOSIS — L821 Other seborrheic keratosis: Secondary | ICD-10-CM | POA: Diagnosis not present

## 2017-05-23 DIAGNOSIS — L304 Erythema intertrigo: Secondary | ICD-10-CM | POA: Diagnosis not present

## 2017-06-08 DIAGNOSIS — M47816 Spondylosis without myelopathy or radiculopathy, lumbar region: Secondary | ICD-10-CM | POA: Diagnosis not present

## 2017-06-08 DIAGNOSIS — R030 Elevated blood-pressure reading, without diagnosis of hypertension: Secondary | ICD-10-CM | POA: Diagnosis not present

## 2017-06-15 ENCOUNTER — Other Ambulatory Visit: Payer: Self-pay | Admitting: Internal Medicine

## 2017-06-17 NOTE — Telephone Encounter (Signed)
Copied from [place] [id number]. Topic: Quick Communication - Rx Refill/Question >> Jun 17, 2017 10:39 AM Percell Belt A wrote: Medication simvastatin (ZOCOR) 10 MG tablet [Pharmacy Med Name   Has the patient contacted their pharmacy? yes   (Agent: If no, request that the patient contact the pharmacy for the refill.)   Preferred Pharmacy (with phone number or street name): [facility] [address] / pt is completely out    Agent: Please be advised that RX refills may take up to 3 business days. We ask that you follow-up with your pharmacy.

## 2017-06-17 NOTE — Telephone Encounter (Signed)
Pt called about refill - [facility] [address]  pharmacy has not heard back about medication

## 2017-07-05 ENCOUNTER — Emergency Department (HOSPITAL_COMMUNITY)
Admission: EM | Admit: 2017-07-05 | Discharge: 2017-07-05 | Disposition: A | Discharge status: Home / Self Care | Payer: Medicare Other | Attending: Emergency Medicine | Admitting: Emergency Medicine

## 2017-07-05 ENCOUNTER — Encounter (HOSPITAL_COMMUNITY): Payer: Self-pay | Admitting: Emergency Medicine

## 2017-07-05 ENCOUNTER — Emergency Department (HOSPITAL_COMMUNITY): Payer: Medicare Other

## 2017-07-05 DIAGNOSIS — R531 Weakness: Secondary | ICD-10-CM | POA: Insufficient documentation

## 2017-07-05 DIAGNOSIS — R479 Unspecified speech disturbances: Secondary | ICD-10-CM

## 2017-07-05 DIAGNOSIS — Z79899 Other long term (current) drug therapy: Secondary | ICD-10-CM | POA: Insufficient documentation

## 2017-07-05 DIAGNOSIS — G8929 Other chronic pain: Secondary | ICD-10-CM | POA: Insufficient documentation

## 2017-07-05 DIAGNOSIS — R471 Dysarthria and anarthria: Secondary | ICD-10-CM

## 2017-07-05 DIAGNOSIS — Z87891 Personal history of nicotine dependence: Secondary | ICD-10-CM | POA: Insufficient documentation

## 2017-07-05 DIAGNOSIS — M47816 Spondylosis without myelopathy or radiculopathy, lumbar region: Secondary | ICD-10-CM | POA: Diagnosis not present

## 2017-07-05 DIAGNOSIS — G459 Transient cerebral ischemic attack, unspecified: Secondary | ICD-10-CM | POA: Insufficient documentation

## 2017-07-05 DIAGNOSIS — R4781 Slurred speech: Secondary | ICD-10-CM | POA: Diagnosis not present

## 2017-07-05 DIAGNOSIS — I6789 Other cerebrovascular disease: Secondary | ICD-10-CM | POA: Diagnosis not present

## 2017-07-05 DIAGNOSIS — R4789 Other speech disturbances: Secondary | ICD-10-CM | POA: Diagnosis not present

## 2017-07-05 LAB — COMPREHENSIVE METABOLIC PANEL
ALT: 14 U/L (ref 14–54)
AST: 26 U/L (ref 15–41)
Albumin: 3.9 g/dL (ref 3.5–5.0)
Alkaline Phosphatase: 58 U/L (ref 38–126)
Anion gap: 10 (ref 5–15)
BUN: 12 mg/dL (ref 6–20)
CO2: 24 mmol/L (ref 22–32)
Calcium: 9.1 mg/dL (ref 8.9–10.3)
Chloride: 103 mmol/L (ref 101–111)
Creatinine, Ser: 0.79 mg/dL (ref 0.44–1.00)
GFR calc Af Amer: >60 mL/min (ref >60)
GFR calc non Af Amer: >60 mL/min (ref >60)
Glucose, Bld: 101 mg/dL — ABNORMAL HIGH (ref 65–99)
Potassium: 4.3 mmol/L (ref 3.5–5.1)
Sodium: 137 mmol/L (ref 135–145)
Total Bilirubin: 0.5 mg/dL (ref 0.3–1.2)
Total Protein: 6.2 g/dL — ABNORMAL LOW (ref 6.5–8.1)

## 2017-07-05 LAB — URINALYSIS, ROUTINE W REFLEX MICROSCOPIC
Bilirubin Urine: NEGATIVE
Glucose, UA: NEGATIVE mg/dL
Hgb urine dipstick: NEGATIVE
Ketones, ur: 5 mg/dL — AB
Leukocytes, UA: NEGATIVE
Nitrite: POSITIVE — AB
Protein, ur: NEGATIVE mg/dL
Specific Gravity, Urine: 1.01 (ref 1.005–1.030)
pH: 7 (ref 5.0–8.0)

## 2017-07-05 LAB — I-STAT CHEM 8, ED
BUN: 15 mg/dL (ref 6–20)
Calcium, Ion: 1.2 mmol/L (ref 1.15–1.40)
Chloride: 102 mmol/L (ref 101–111)
Creatinine, Ser: 0.7 mg/dL (ref 0.44–1.00)
Glucose, Bld: 99 mg/dL (ref 65–99)
HCT: 40 % (ref 36.0–46.0)
Hemoglobin: 13.6 g/dL (ref 12.0–15.0)
Potassium: 4.4 mmol/L (ref 3.5–5.1)
Sodium: 138 mmol/L (ref 135–145)
TCO2: 24 mmol/L (ref 22–32)

## 2017-07-05 LAB — RAPID URINE DRUG SCREEN, HOSP PERFORMED
Amphetamines: NOT DETECTED
Barbiturates: NOT DETECTED
Benzodiazepines: NOT DETECTED
Cocaine: NOT DETECTED
Opiates: NOT DETECTED
Tetrahydrocannabinol: NOT DETECTED

## 2017-07-05 LAB — DIFFERENTIAL
Basophils Absolute: 0.1 10*3/uL (ref 0.0–0.1)
Basophils Relative: 1 %
Eosinophils Absolute: 0.8 10*3/uL — ABNORMAL HIGH (ref 0.0–0.7)
Eosinophils Relative: 10 %
Lymphocytes Relative: 21 %
Lymphs Abs: 1.6 10*3/uL (ref 0.7–4.0)
Monocytes Absolute: 0.4 10*3/uL (ref 0.1–1.0)
Monocytes Relative: 6 %
Neutro Abs: 4.8 10*3/uL (ref 1.7–7.7)
Neutrophils Relative %: 62 %

## 2017-07-05 LAB — CBC
HCT: 39.4 % (ref 36.0–46.0)
Hemoglobin: 13.3 g/dL (ref 12.0–15.0)
MCH: 31.7 pg (ref 26.0–34.0)
MCHC: 33.8 g/dL (ref 30.0–36.0)
MCV: 94 fL (ref 78.0–100.0)
Platelets: 249 10*3/uL (ref 150–400)
RBC: 4.19 MIL/uL (ref 3.87–5.11)
RDW: 12.6 % (ref 11.5–15.5)
WBC: 7.6 10*3/uL (ref 4.0–10.5)

## 2017-07-05 LAB — I-STAT TROPONIN, ED: Troponin i, poc: 0 ng/mL (ref 0.00–0.08)

## 2017-07-05 LAB — PROTIME-INR
INR: 0.91
Prothrombin Time: 12.2 seconds (ref 11.4–15.2)

## 2017-07-05 LAB — ETHANOL: Alcohol, Ethyl (B): <10 mg/dL (ref <10)

## 2017-07-05 LAB — APTT: aPTT: 28 seconds (ref 24–36)

## 2017-07-05 NOTE — ED Notes (Signed)
Per MRI, pt to be transported in approx 20 mins, will notify pt

## 2017-07-05 NOTE — ED Triage Notes (Signed)
Pt was at [facility] having a procedure done for back pt according to pt was having a injection in spine for lower back pain. During injection pt began having trouble speaking and "had a weird feeling over her whole body". On ems arrival pt slurred speech with left leg weakness. Before transporting pt resolved and back to baseline.

## 2017-07-05 NOTE — ED Notes (Signed)
Verified with MG Tegler that he intentionally did not order a CT at this point awaiting to hear from MD doing procedure. PT continues to have no symptoms.

## 2017-07-05 NOTE — ED Provider Notes (Signed)
[facility]ERGENC[facility] DEPARTMENT Provider Note   CSN: [id number] Arrival date & time: 07/05/17  1652     History   Chief Complaint Chief Complaint  Patient presents with  . Transient Ischemic Attack    HPI Susan Ford is a 84 y.o. female.  The history is provided by the patient and medical records. No language interpreter was used.  Neurologic Problem  This is a new problem. The current episode started 1 to 2 hours ago. The problem occurs rarely. The problem has been resolved. Pertinent negatives include no chest pain, no abdominal pain, no headaches and no shortness of breath. Nothing aggravates the symptoms. Nothing relieves the symptoms. She has tried nothing for the symptoms. The treatment provided no relief.    Past Medical History:  Diagnosis Date  . Arthritis   . Cataract 2007   bilateral cat ext   . Cholecystitis   . Complication of anesthesia Feb 21, 2014   atrial fib after cholecystectomy, lasted a few hours, none since  . Dysrhythmia 02-21-2014   after cholecystectomy had atrial fib for a few hours, none since  . GERD (gastroesophageal reflux disease)    Had endoscopy no history of Barrett's  . Hx of nonmelanoma skin cancer   . Unspecified hereditary and idiopathic peripheral neuropathy     Patient Active Problem List   Diagnosis Date Noted  . Gait abnormality 06/29/2016  . Chronic bilateral low back pain without sciatica 06/29/2016  . Memory loss 2015/10/31  . Death of husband 04-27-14  . Bereavement due to life event Apr 27, 2014  . Absolute anemia 04/08/2014  . Atrial flutter ([place]) 02/23/2014  . Acute calculous cholecystitis 02/21/2014  . Spinal stenosis of lumbar region 01/04/2014  . Recent bereavement 10/25/2013  . Arthritis 10/25/2013  . Hyperlipidemia 10/23/2013  . Hereditary and idiopathic peripheral neuropathy   . Nausea alone 07/14/2012  . Neuropathy, peripheral 05/05/2011  . High risk medication use 05/05/2011    . Subcutaneous emphysema ([place]) 05/05/2011  . Acute respiratory infection 04/03/2011  . Cough 04/03/2011  . Neuropathy 04/03/2011  . Osteoarthritis 05/15/2010  . STIFFNESS OF JOINT NEC ANKLE AND FOOT 05/15/2010  . GANGLION CYST, WRIST, RIGHT 05/15/2010  . VERTIGO, BENIGN PAROXYSMAL POSITION 01/31/2007    Past Surgical History:  Procedure Laterality Date  . ABDOMINAL HYSTERECTOMY  15 yrs ago   complete  . BREAST CYST ASPIRATION    . CHOLECYSTECTOMY N/A 02/21/2014   Procedure: LAPAROSCOPIC CHOLECYSTECTOMY WITH INTRAOPERATIVE CHOLANGIOGRAM;  Surgeon: Doreen Salvage, MD;  Location: [facility];  Service: General;  Laterality: N/A;  . ENDOSCOPIC RETROGRADE CHOLANGIOPANCREATOGRAPHY (ERCP) WITH PROPOFOL N/A 05/29/2014   Procedure: ENDOSCOPIC RETROGRADE CHOLANGIOPANCREATOGRAPHY (ERCP) WITH PROPOFOL;  Surgeon: Arta Silence, MD;  Location: [facility] ENDOS[facility];  Service: Endoscopy;  Laterality: N/A;  . ERCP N/A 02/24/2014   Procedure: ENDOSCOPIC RETROGRADE CHOLANGIOPANCREATOGRAPHY (ERCP);  Surgeon: Arta Silence, MD;  Location: [facility] ENDOSCOPY;  Service: Endoscopy;  Laterality: N/A;  . EYE SURGERY Bilateral 2007   both eyes cataracts with lens replacments  . LUMBAR LAMINECTOMY/DECOMPRESSION MICRODISCECTOMY  02/01/2012   Procedure: LUMBAR LAMINECTOMY/DECOMPRESSION MICRODISCECTOMY 2 LEVELS;  Surgeon: Kristeen Miss, MD;  Location: [facility];  Service: Neurosurgery;  Laterality: Left;  Left Lumbar Four-Five Lumbar Five-Sacral One Laminectomies/Foraminotomies/Microscope  . SPYGLASS CHOLANGIOSCOPY N/A 05/29/2014   Procedure: SPYGLASS CHOLANGIOSCOPY;  Surgeon: Arta Silence, MD;  Location: [facility] ENDOSCOPY;  Service: Endoscopy;  Laterality: N/A;  . TONSILLECTOMY  age 35    OB History    No data available  Home Medications    Prior to Admission medications   Medication Sig Start Date End Date Taking? Authorizing Provider  ascorbic acid (VITAMIN C) 1000 MG tablet Take 1,000 mg by mouth daily after lunch.      [provider]  aspirin 81 MG tablet Take 81 mg by mouth daily after lunch.     [provider]  calcium citrate-vitamin D (CITRACAL+D) 315-200 MG-UNIT per tablet Take 1 tablet by mouth daily.     [provider]  celecoxib (CELEBREX) 200 MG capsule Take 1 capsule (200 mg total) by mouth daily. 09/07/16   Panosh, Standley Brooking, MD  estradiol (CLIMARA - DOSED IN MG/24 HR) 0.025 mg/24hr patch PLACE 1 PATCH ONTO THE SKIN ONCE A WEEK. ON SATURDAY 09/06/16   Panosh, Standley Brooking, MD  famotidine (PEPCID) 20 MG tablet Take 20 mg by mouth daily.    [provider]  gabapentin (NEURONTIN) 600 MG tablet Take 1 tablet (600 mg total) by mouth 4 (four) times daily. 04/20/17   Marcial Pacas, MD  levocetirizine (XYZAL) 5 MG tablet Take 1 tablet daily by mouth. 01/24/17   [provider]  loratadine (CLARITIN) 10 MG tablet Take 10 mg daily by mouth.    [provider]  Multiple Vitamin (MULTIVITAMIN) tablet Take 1 tablet by mouth daily after lunch.     [provider]  simvastatin (ZOCOR) 10 MG tablet TAKE 1 TABLET (10 MG TOTAL) BY MOUTH AT BEDTIME. 06/17/17   Panosh, Standley Brooking, MD    Family History Family History  Problem Relation Age of Onset  . Neurodegenerative disease Mother        Mikey Bussing Drager died at 43  . Alcohol abuse Unknown   . Heart disease Unknown     Social History Social History   Tobacco Use  . Smoking status: Former Smoker    Packs/day: 0.25    Years: 7.00    Pack years: 1.75    Types: Cigarettes    Last attempt to quit: 04/27/1963    Years since quitting: 54.2  . Smokeless tobacco: Never Used  Substance Use Topics  . Alcohol use: Yes    Alcohol/week: 4.2 oz    Types: 7 Glasses of wine per week    Comment: 1 glass of wine daily  . Drug use: No     Allergies   Keflex [cephalexin]; Lyrica [pregabalin]; Oxytetracycline; and Memantine   Review of Systems Review of Systems  Constitutional: Negative for chills and diaphoresis.    HENT: Negative for congestion.   Eyes: Negative for visual disturbance.  Respiratory: Negative for chest tightness, shortness of breath, wheezing and stridor.   Cardiovascular: Negative for chest pain, palpitations and leg swelling.  Gastrointestinal: Negative for abdominal pain, constipation, diarrhea, nausea and vomiting.  Genitourinary: Negative for dysuria, flank pain and frequency.  Musculoskeletal: Negative for back pain, neck pain and neck stiffness.  Skin: Negative for rash and wound.  Neurological: Positive for speech difficulty and weakness. Negative for dizziness, facial asymmetry, light-headedness, numbness and headaches.  Psychiatric/Behavioral: Negative for agitation and confusion.  All other systems reviewed and are negative.    Physical Exam Updated Vital Signs Ht 5\' 4"  (1.626 m)   Wt 59.9 kg (132 lb)   BMI 22.66 kg/m   Physical Exam  Constitutional: She is oriented to person, place, and time. She appears well-developed and well-nourished. No distress.  HENT:  Mouth/Throat: Oropharynx is clear and moist. No oropharyngeal exudate.  Eyes: Conjunctivae and EOM are normal. Pupils  are equal, round, and reactive to light.  Neck: Normal range of motion.  Cardiovascular: Normal rate and intact distal pulses.  No murmur heard. Pulmonary/Chest: Effort normal. No respiratory distress. She has no wheezes. She exhibits no tenderness.  Abdominal: Soft. Bowel sounds are normal. She exhibits no distension. There is no tenderness.  Musculoskeletal: She exhibits no edema or tenderness.  Neurological: She is alert and oriented to person, place, and time. No cranial nerve deficit or sensory deficit. She exhibits normal muscle tone. Coordination normal.  Skin: Capillary refill takes less than 2 seconds. No rash noted. She is not diaphoretic. No erythema.  Psychiatric: She has a normal mood and affect.  Nursing note and vitals reviewed.    ED Treatments / Results  Labs (all labs  ordered are listed, but only abnormal results are displayed) Labs Reviewed  DIFFERENTIAL - Abnormal; Notable for the following components:      Result Value   Eosinophils Absolute 0.8 (*)    All other components within normal limits  COMPREHENSIVE METABOLIC PANEL - Abnormal; Notable for the following components:   Glucose, Bld 101 (*)    Total Protein 6.2 (*)    All other components within normal limits  URINALYSIS, ROUTINE W REFLEX MICROSCOPIC - Abnormal; Notable for the following components:   Ketones, ur 5 (*)    Nitrite POSITIVE (*)    Bacteria, UA RARE (*)    Squamous Epithelial / LPF 0-5 (*)    All other components within normal limits  ETHANOL  PROTIME-INR  APTT  CBC  RAPID URINE DRUG SCREEN, HOSP PERFORMED  I-STAT CHEM 8, ED  I-STAT TROPONIN, ED    EKG  EKG Interpretation  Date/Time:  Tuesday July 05 2017 16:55:44 EDT Ventricular Rate:  78 PR Interval:    QRS Duration: 90 QT Interval:  403 QTC Calculation: 459 R Axis:   51 Text Interpretation:  Sinus rhythm Borderline prolonged PR interval RSR' in V1 or V2, probably normal variant when compared to prior, no significant changes. no STEMI Confirmed by Antony Blackbird [phone]) on 07/05/2017 5:32:08 PM       Radiology Mr Brain Wo Contrast  Result Date: 07/05/2017 CLINICAL DATA:  Slurred speech and LEFT leg weakness after spinal procedure. Assess TIA. EXAM: MRI HEAD WITHOUT CONTRAST TECHNIQUE: Multiplanar, multiecho pulse sequences of the brain and surrounding structures were obtained without intravenous contrast. COMPARISON:  MRI of the head October 31, 2015 FINDINGS: INTRACRANIAL CONTENTS: No reduced diffusion to suggest acute ischemia. No susceptibility artifact to suggest hemorrhage. The ventricles and sulci are normal for patient's age. Patchy supratentorial white matter FLAIR T2 hyperintensities compatible with mild chronic small vessel ischemic disease, stable from prior examination. No suspicious parenchymal signal,  masses, mass effect. No abnormal extra-axial fluid collections. No extra-axial masses. VASCULAR: Normal major intracranial vascular flow voids present at skull base. RIGHT transverse dural venous sinus arachnoid granulation. SKULL AND UPPER CERVICAL SPINE: No abnormal sellar expansion. No suspicious calvarial bone marrow signal. Craniocervical junction maintained. SINUSES/ORBITS: The mastoid air-cells and included paranasal sinuses are well-aerated.The included ocular globes and orbital contents are non-suspicious. Status post bilateral ocular lens implants. OTHER: None. IMPRESSION: Negative noncontrast MRI of the head for age. Electronically Signed   By: Elon Alas M.D.   On: 07/05/2017 22:08    Procedures Procedures (including critical care time)  Medications Ordered in ED Medications - No data to display   Initial Impression / Assessment and Plan / ED Course  I have reviewed the triage vital signs  and the nursing notes.  Pertinent labs & imaging results that were available during my care of the patient were reviewed by me and considered in my medical decision making (see chart for details).     Susan Ford is a 84 y.o. female with a past medical history significant for GERD, chronic back pain, and report of intermittent atrial fibrillation related to anesthesia use last year who presents from a neurosurgical procedure for transient speech abnormality and leg weakness.  According to patient, she was having a lumbar spine procedure done where she is getting injected with lidocaine on the right side of her back.  She reports that during the procedure and after the procedure, the team became concerned because she was slurring her speech.  They also told her that her left leg was weak.  Patient was transferred to the emergency department for evaluation of possible TIA or patient was coming by paperwork that did not describes the patient's chief complaint.  An attempt will be made to  contact the procedural team to hear about what happened.  Next  Patient says that she had no preceding symptoms other than her chronic back pain.  She denied any specific chest pain, palpitations, lightheadedness, nausea, vomiting, vision changes or any unilateral weakness before procedure.  She says that her speech problem lasted for approximately 15-20 minutes before completely resolving.  Patient says that she is at her speech baseline on arrival to the emergency department.  She denies any weakness in any extremities and has no numbness or tingling throughout.  Patient also denies any other complaints on arrival specifically, no chest pain or palpitations, or infectious symptoms.  On my initial exam, patient had no focal neurologic deficits.  Patient has a Band-Aid over her right low back from the procedure in her back is completely nontender.  No evidence of cellulitis or erythema around the site.  Patient had normal strength in her legs bilaterally.  No clonus.  Patient did have a brisk reflex in her left patellar reflex compared to the right however she had normal sensation bilaterally in the legs.  Patient had normal coordination and sensation in her lower and upper extremity gait was deferred initially.  Patient had normal sensation and no facial droop.  Patient will have stroke workup with laboratory testing and will speak with the neurosurgery team to do the procedure.  Patient could have headache application with the medications that were injected however with her history of intermittent atrial fibrillation, patient may need further TIA workup.  Neurosurgery felt that this was not a complication of the procedure but the recommended further workup and speaking with neurology.  Neurology recommended MRI to further evaluate.  MRI was reassuring. No evidence of stroke seen.  On further reassessment, patient does report that the last time he had an injection she had some lightheadedness and  dizziness.  She does report that she was somewhat dizzy today.  I suspect the symptoms may have been related to the injection process or the medication use.  Do not feel patient had a true TIA or stroke.  Patient and family want to be discharged rather than stay for further workup.  Patient will follow up with her PCP and her neurologist for further evaluation and management.  Patient given extremely strict return precautions for any new or worsened symptoms and patient advised to follow-up with neurosurgery before doing any more procedures.  Patient and family understood plan of care and had no other questions or concerns.  Patient was discharged in good condition with no further symptoms during the entirety of her ED stay.  Final Clinical Impressions(s) / ED Diagnoses   Final diagnoses:  Transient speech disturbance  Dysarthria    ED Discharge Orders    None      Clinical Impression: 1. Transient speech disturbance   2. Dysarthria     Disposition: Discharge  Condition: Good  I have discussed the results, Dx and Tx plan with the pt(& family if present). He/she/they expressed understanding and agree(s) with the plan. Discharge instructions discussed at great length. Strict return precautions discussed and pt &/or family have verbalized understanding of the instructions. No further questions at time of discharge.    Discharge Medication List as of 07/05/2017 10:54 PM      Follow Up: Burnis Medin, MD [place] [address] [phone]     [facility] EMERGENCY DEPARTMENT [address] [id number] mc [place]       Tegeler, Gwenyth Allegra, MD 07/06/17 [phone]

## 2017-07-05 NOTE — ED Notes (Signed)
ED Provider at bedside. 

## 2017-07-05 NOTE — ED Notes (Signed)
Patient transported to MRI 

## 2017-07-05 NOTE — Discharge Instructions (Signed)
Your workup today did not show evidence of stroke.  We suspect her symptoms were related to your procedure that she had.  Please follow-up with your primary doctor and your back team.  If you develop any new or worsened symptoms, please return immediately to the nearest emergency department.

## 2017-07-05 NOTE — ED Notes (Signed)
Pt given ice water and crackers per EDP

## 2017-07-13 DIAGNOSIS — M47816 Spondylosis without myelopathy or radiculopathy, lumbar region: Secondary | ICD-10-CM | POA: Diagnosis not present

## 2017-07-18 DIAGNOSIS — L299 Pruritus, unspecified: Secondary | ICD-10-CM | POA: Diagnosis not present

## 2017-07-18 DIAGNOSIS — J3089 Other allergic rhinitis: Secondary | ICD-10-CM | POA: Diagnosis not present

## 2017-08-04 DIAGNOSIS — M47816 Spondylosis without myelopathy or radiculopathy, lumbar region: Secondary | ICD-10-CM | POA: Diagnosis not present

## 2017-08-04 DIAGNOSIS — M961 Postlaminectomy syndrome, not elsewhere classified: Secondary | ICD-10-CM | POA: Diagnosis not present

## 2017-08-09 DIAGNOSIS — M461 Sacroiliitis, not elsewhere classified: Secondary | ICD-10-CM | POA: Diagnosis not present

## 2017-08-09 DIAGNOSIS — I10 Essential (primary) hypertension: Secondary | ICD-10-CM | POA: Diagnosis not present

## 2017-08-09 DIAGNOSIS — Z681 Body mass index (BMI) 19 or less, adult: Secondary | ICD-10-CM | POA: Diagnosis not present

## 2017-08-24 DIAGNOSIS — H5201 Hypermetropia, right eye: Secondary | ICD-10-CM | POA: Diagnosis not present

## 2017-08-24 DIAGNOSIS — H5212 Myopia, left eye: Secondary | ICD-10-CM | POA: Diagnosis not present

## 2017-08-24 DIAGNOSIS — Z961 Presence of intraocular lens: Secondary | ICD-10-CM | POA: Diagnosis not present

## 2017-08-24 DIAGNOSIS — H524 Presbyopia: Secondary | ICD-10-CM | POA: Diagnosis not present

## 2017-08-24 DIAGNOSIS — H52223 Regular astigmatism, bilateral: Secondary | ICD-10-CM | POA: Diagnosis not present

## 2017-08-30 ENCOUNTER — Telehealth: Payer: Self-pay | Admitting: Internal Medicine

## 2017-08-30 NOTE — Telephone Encounter (Signed)
Copied from [place] [id number]. Topic: Quick Communication - See Telephone Encounter >> Aug 30, 2017  2:24 PM Percell Belt A wrote: CRM for notification. See Telephone encounter for: 08/30/17. Pt called in and stated that pharmacy is saying that the estradiol (CLIMARA - DOSED IN MG/24 HR) 0.025 mg/24hr patch [[id number]]  is need a PA done on it. Have you rec'd a PA ?

## 2017-08-31 DIAGNOSIS — M461 Sacroiliitis, not elsewhere classified: Secondary | ICD-10-CM | POA: Diagnosis not present

## 2017-08-31 DIAGNOSIS — Z681 Body mass index (BMI) 19 or less, adult: Secondary | ICD-10-CM | POA: Diagnosis not present

## 2017-09-01 NOTE — Telephone Encounter (Signed)
Prior auth sent to [email or web address].

## 2017-09-11 ENCOUNTER — Other Ambulatory Visit: Payer: Self-pay | Admitting: Internal Medicine

## 2017-09-16 NOTE — Telephone Encounter (Signed)
Fax received from [facility] stating the request was approved from 09/02/2017-09/02/2018.  I called [facility] and left a detailed message with this info on the voicemail and I called the pt and informed her of this.

## 2017-09-20 DIAGNOSIS — M47816 Spondylosis without myelopathy or radiculopathy, lumbar region: Secondary | ICD-10-CM | POA: Diagnosis not present

## 2017-09-20 DIAGNOSIS — R030 Elevated blood-pressure reading, without diagnosis of hypertension: Secondary | ICD-10-CM | POA: Diagnosis not present

## 2017-09-20 DIAGNOSIS — M961 Postlaminectomy syndrome, not elsewhere classified: Secondary | ICD-10-CM | POA: Diagnosis not present

## 2017-09-20 DIAGNOSIS — M5126 Other intervertebral disc displacement, lumbar region: Secondary | ICD-10-CM | POA: Diagnosis not present

## 2017-09-20 DIAGNOSIS — M5136 Other intervertebral disc degeneration, lumbar region: Secondary | ICD-10-CM | POA: Diagnosis not present

## 2017-09-20 DIAGNOSIS — M533 Sacrococcygeal disorders, not elsewhere classified: Secondary | ICD-10-CM | POA: Diagnosis not present

## 2017-09-30 NOTE — Progress Notes (Signed)
[facility]  PATIENT: Susan Ford DOB: [birth year]   REASON FOR VISIT: followup for low back pain, [facility] HISTORY FROM: Patient    HISTORY OF PRESENT ILLNESS:  Susan Ford is a 84 year-old right-handed white female,She was a patient of Dr. Erling Cruz, last visit was May 2014   She had a history of lumbar decompression surgery, presenting with chronic low back pain, mild gait difficulty due to pain.  MRI of the lumbar spine 12/25/2010 shows diffuse spondylitic changes and progression of degenerative changes at L3-4 and L4-5 and modest broad disc bulge at L5-S1. There is evidence of neuroforaminal stenosis on the left at L4-5. She has pain in her feet and aching from her knees down.  EMG/NCV 12/24/2011 showing progression of peripheral neuropathy versus a prior study 09/13/2011. There was also a mild overlying acute L5 radiculopathy on the left. She underwent surgical decompression at L4-5 and L5-S1 with bilateral foraminotomies and microdiscectomy on the left at L4-5 02/01/2012 by Dr. Ellene Route, Surgery helped her. She still complains of pain at low back pain, waist line, going down her leg, at both leg.  She denies bowel/bladder incontinence  She has difficulty walking, because of low back pain, worst pain in the morning, she usually take her hydrocodone early morning, then napping for a while, then she can function again.  Previously she has tried Lyrica which caused abdominal cramps, Cymbalta, caused nausea. She had side effects from nortriptyline 20 mg per night. She is on gabapentin 600, 1-1/2 tablets 4 times per day and complains of memory loss on the medication and now questions any improvement in pain. She uses a cane, but not in her house.   She underwent an epidural steroid injection 10/2011 with improvement in her back pain.   She is independent in activities of daily living including driving a car, cooking,cleaning, shopping, and doing  laundry.   UPDATE April 20th 2016:YY Last epidural injection was in 2015, she still has a lot of low back pain, she was put on nortriptyline 25 mg qhs, neurontin 641m qid, hydrocodone prn, celebrex 208mdaily.  We have reviewed MRI lumbar in Feb 2016: Multilevel degenerative disc disease, with mild spinal stenosis, facet arthropathy, mild to moderate foraminal stenosis at different level She complains of constant low back pian, bilatera legs achy pain, triggered by standing for just 10 minutes, she has to sit down resting after standing or walking for short period of time  She is taking Gabapentin 600 mg 4 times a day, Celexa 200 mg every day, recently started nortriptyline 25 mg every night hydrocodone as needed previously tried Cymbalta, without helping her symptoms, untolerable side effect  UPDATE Jan 14 2015: YY She is alone at visit, she still takes celebrex 20086mid, Neurontin 600 mg 4 times a day, she could stand for 10 mintes, then has to sit down because of her low back pain, she also complains of intermittent bilateral lower extremity deep achy pain, She rarely needs narcotics now, denies bowel and bladder incontinence, no bilateral lower extremity numbness  UPDATE Sept 6th 2017:YY Last clinical visit was with CarHoyle Sauer June 2017, she is concerned about her memory loss, Laboratory evaluation in June 2017 showed normal TSH, B12, ESR, negative RPR, mild elevated glucose 109 on BMP, I have personally reviewed MRI of the brain in July 2017, moderate generalized atrophy, mild supratentorium small vessel disease,  She lives alone, still drives, but reported she got lost sometimes, today's Mini-Mental status examination 27 out  of 15  UPDATE June 29 2016:YY She is alone at today's clinical visit, we have personally reviewed MRI of the brain, moderate generalized atrophy, small supratentorium small vessel disease, today she denies significant memory loss, lives alone at her house,  still driving,  Her low back pain has improved, stay at low back, denies radiating pain.  MRI of lumbar spine demonstrate multilevel degenerative changes, moderate bilateral foraminal stenosis at L5-S1, moderate stenosis at L 4-5, prior left L5 laminectomy  UPDATE Mar 22 2017:YYShe complains of worsening low back pain and bilateral foot, distal deep achy pain,  She is now taking gabapentin '600mg'$  qid, celebrex '200mg'$  qday, she sleeps well, worsening pain after bearing weight,  UPDATE 6/10/2019CM Ms.Gunderson, 84 year old female returns for follow-up with history of low back pain and mild cognitive impairment.  She is currently on Neurontin 600 mg 4 times daily and Celebrex daily.  She also goes to pain management for injections Dr. Orpah Melter.  She was started on Cymbalta at her last visit however she never got the medication filled.  Her back pain is fairly stable.  She continues to be independent with activities of daily living.  She continues to drive without difficulty.  She returns for reevaluation   REVIEW OF SYSTEMS: Full 14 system review of systems performed and notable only for those listed, all others are neg:  Constitutional: neg  Cardiovascular: neg Ear/Nose/Throat: neg  Skin: neg Eyes: neg Respiratory: neg Gastroitestinal: neg  Hematology/Lymphatic: neg  Endocrine: neg Musculoskeletal: Back pain Allergy/Immunology: neg Neurological: Mild cognitive impairment Psychiatric: neg Sleep : neg   ALLERGIES: Allergies  Allergen Reactions  . Keflex [Cephalexin]     Dizziness  . Lyrica [Pregabalin] Other (See Comments)    "High Fever"   . Oxytetracycline Other (See Comments)    "High Fever" caused by terramycin  . Memantine Rash    HOME MEDICATIONS: Outpatient Medications Prior to Visit  Medication Sig Dispense Refill  . ascorbic acid (VITAMIN C) 1000 MG tablet Take 1,000 mg by mouth daily after lunch.     Marland Kitchen aspirin 81 MG tablet Take 81 mg by mouth daily after lunch.       . calcium citrate-vitamin D (CITRACAL+D) 315-200 MG-UNIT per tablet Take 1 tablet by mouth daily.     . celecoxib (CELEBREX) 200 MG capsule Take 1 capsule (200 mg total) by mouth daily. 90 capsule 0  . estradiol (CLIMARA - DOSED IN MG/24 HR) 0.025 mg/24hr patch PLACE 1 PATCH ONTO THE SKIN ONCE A WEEK. ON SATURDAY 8 patch 4  . famotidine (PEPCID) 20 MG tablet Take 20 mg by mouth daily.    Marland Kitchen gabapentin (NEURONTIN) 600 MG tablet Take 1 tablet (600 mg total) by mouth 4 (four) times daily. 360 tablet 4  . Multiple Vitamin (MULTIVITAMIN) tablet Take 1 tablet by mouth daily after lunch.     . simvastatin (ZOCOR) 10 MG tablet TAKE 1 TABLET (10 MG TOTAL) BY MOUTH AT BEDTIME. 90 tablet 1  . levocetirizine (XYZAL) 5 MG tablet Take 5 mg by mouth daily.   3  . loratadine (CLARITIN) 10 MG tablet Take 10 mg daily by mouth.     No facility-administered medications prior to visit.     PAST MEDICAL HISTORY: Past Medical History:  Diagnosis Date  . Arthritis   . Cataract 2007   bilateral cat ext   . Cholecystitis   . Complication of anesthesia Feb 21, 2014   atrial fib after cholecystectomy, lasted a few hours, none since  .  Dysrhythmia 02-21-2014   after cholecystectomy had atrial fib for a few hours, none since  . GERD (gastroesophageal reflux disease)    Had endoscopy no history of Barrett's  . Hx of nonmelanoma skin cancer   . Unspecified hereditary and idiopathic peripheral neuropathy     PAST SURGICAL HISTORY: Past Surgical History:  Procedure Laterality Date  . ABDOMINAL HYSTERECTOMY  15 yrs ago   complete  . BREAST CYST ASPIRATION    . CHOLECYSTECTOMY N/A 02/21/2014   Procedure: LAPAROSCOPIC CHOLECYSTECTOMY WITH INTRAOPERATIVE CHOLANGIOGRAM;  Surgeon: Doreen Salvage, MD;  Location: [facility];  Service: General;  Laterality: N/A;  . ENDOSCOPIC RETROGRADE CHOLANGIOPANCREATOGRAPHY (ERCP) WITH PROPOFOL N/A 05/29/2014   Procedure: ENDOSCOPIC RETROGRADE CHOLANGIOPANCREATOGRAPHY (ERCP) WITH PROPOFOL;   Surgeon: Arta Silence, MD;  Location: [facility];  Service: Endoscopy;  Laterality: N/A;  . ERCP N/A 02/24/2014   Procedure: ENDOSCOPIC RETROGRADE CHOLANGIOPANCREATOGRAPHY (ERCP);  Surgeon: Arta Silence, MD;  Location[facility]COPY;  Service: Endoscopy;  Laterality: N/A;  . EYE SURGERY Bilateral 2007   both eyes cataracts with lens replacments  . LUMBAR LAMINECTOMY/DECOMPRESSION MICRODISCECTOMY  02/01/2012   Procedure: LUMBAR LAMINECTOMY/DECOMPRESSION MICRODISCECTOMY 2 LEVELS;  Surgeon: Kristeen Miss, MD;  Location: [facility];  Service: Neurosurgery;  Laterality: Left;  Left Lumbar Four-Five Lumbar Five-Sacral One Laminectomies/Foraminotomies/Microscope  . SPYGLASS CHOLANGIOSCOPY N/A 05/29/2014   Procedure: SPYGLASS CHOLANGIOSCOPY;  Surgeon: Arta Silence, MD;  Location: [facility];  Service: Endoscopy;  Laterality: N/A;  . TONSILLECTOMY  age 89    FAMILY HISTORY: Family History  Problem Relation Age of Onset  . Neurodegenerative disease Mother        Mikey Bussing Drager died at 30  . Alcohol abuse Unknown   . Heart disease Unknown     SOCIAL HISTORY: Social History   Socioeconomic History  . Marital status: Widowed    Spouse name: Gwyndolyn Saxon  . Number of children: Not on file  . Years of education: Not on file  . Highest education level: Not on file  Occupational History  . Occupation: Retired    Comment: Animal nutritionist  . Financial resource strain: Not on file  . Food insecurity:    Worry: Not on file    Inability: Not on file  . Transportation needs:    Medical: Not on file    Non-medical: Not on file  Tobacco Use  . Smoking status: Former Smoker    Packs/day: 0.25    Years: 7.00    Pack years: 1.75    Types: Cigarettes    Last attempt to quit: 04/27/1963    Years since quitting: 54.4  . Smokeless tobacco: Never Used  Substance and Sexual Activity  . Alcohol use: Yes    Alcohol/week: 4.2 oz    Types: 7 Glasses of wine per week    Comment: 1 glass of wine daily  .  Drug use: No    Types: Hydrocodone  . Sexual activity: Yes  Lifestyle  . Physical activity:    Days per week: Not on file    Minutes per session: Not on file  . Stress: Not on file  Relationships  . Social connections:    Talks on phone: Not on file    Gets together: Not on file    Attends religious service: Not on file    Active member of club or organization: Not on file    Attends meetings of clubs or organizations: Not on file    Relationship status: Not on file  . Intimate partner  violence:    Fear of current or ex partner: Not on file    Emotionally abused: Not on file    Physically abused: Not on file    Forced sexual activity: Not on file  Other Topics Concern  . Not on file  Social History Narrative   Retired Therapist, sports    widowed since    Spring  15    [employer] [facility]    Regular exercise former smoker   G3P3   From [place] originally         PHYSICAL EXAM  Vitals:   10/03/17 0927  BP: 122/69  Pulse: 79  Weight: 130 lb (59 kg)  Height: '5\' 4"'$  (1.626 m)   Body mass index is 22.31 kg/m.  Generalized: Well developed, in no acute distress  Head: normocephalic and atraumatic,. Oropharynx benign  Neck: Supple, no carotid bruits  Cardiac: Regular rate rhythm, no murmur  Musculoskeletal: No deformity   Neurological examination   Mentation: Alert AFT 9. Clock drawing 3/4 MMSE - Mini Mental State Exam 10/03/2017 05/11/2017 06/29/2016  Not completed: - (No Data) -  Orientation to time 5 - 5  Orientation to Place 4 - 5  Registration 3 - 3  Attention/ Calculation 5 - 5  Recall 1 - 3  Language- name 2 objects 2 - 2  Language- repeat 1 - 1  Language- follow 3 step command 3 - 3  Language- read & follow direction 1 - 1  Write a sentence 1 - 1  Copy design 1 - 1  Total score 27 - 30    Follows all commands speech and language fluent.   Cranial nerve II-XII: Pupils were equal round reactive to light extraocular movements were full, visual field were full on confrontational  test. Facial sensation and strength were normal. hearing was intact to finger rubbing bilaterally. Uvula tongue midline. head turning and shoulder shrug were normal and symmetric.Tongue protrusion into cheek strength was normal. Motor: normal bulk and tone, full strength in the BUE, BLE,  Sensory: normal and symmetric to light touch, pinprick, and  Vibration, in the lower extremities except the decreased vibratory to both ankles Coordination: finger-nose-finger, heel-to-shin bilaterally, no dysmetria Reflexes: Symmetric upper and lower, plantar responses were flexor bilaterally. Gait and Station: Rising up from seated position without assistance, stooped posture Mild antalgic cautious gait, no assistive device, Romberg absent DIAGNOSTIC DATA (LABS, IMAGING, TESTING) - I reviewed patient records, labs, notes, testing and imaging myself where available.  Lab Results  Component Value Date   WBC 7.6 07/05/2017   HGB 13.6 07/05/2017   HCT 40.0 07/05/2017   MCV 94.0 07/05/2017   PLT 249 07/05/2017      Component Value Date/Time   NA 138 07/05/2017 1820   NA 133 (A) 07/16/2016   K 4.4 07/05/2017 1820   CL 102 07/05/2017 1820   CO2 24 07/05/2017 1813   GLUCOSE 99 07/05/2017 1820   BUN 15 07/05/2017 1820   BUN 16 07/16/2016   CREATININE 0.70 07/05/2017 1820   CALCIUM 9.1 07/05/2017 1813   PROT 6.2 (L) 07/05/2017 1813   ALBUMIN 3.9 07/05/2017 1813   AST 26 07/05/2017 1813   ALT 14 07/05/2017 1813   ALKPHOS 58 07/05/2017 1813   BILITOT 0.5 07/05/2017 1813   GFRNONAA >60 07/05/2017 1813   GFRAA >60 07/05/2017 1813   Lab Results  Component Value Date   CHOL 185 03/04/2017   HDL 75.10 03/04/2017   LDLCALC 87 03/04/2017   LDLDIRECT 153  02/12/2009   TRIG 116.0 03/04/2017   CHOLHDL 2 03/04/2017   Lab Results  Component Value Date   HGBA1C 5.6 02/18/2016   Lab Results  Component Value Date   [id number] 081 10/15/2015   Lab Results  Component Value Date   TSH 0.89 07/16/2016       ASSESSMENT AND PLAN  FERRAH PANAGOPOULOS is a 84 year old female with mild cognitive impairment-, evidence of  at least moderate generalized atrophy, mild supratentorium small vessel disease and chronic low back pain with lumbar radiculopathy, mild foot numbness mild gait abnormality.                PLAN: Memory score is stable she has failed Namenda in the past.  She is not interested in research study Continue Neurontin at current dose does not need refills  Continue  Aspirin for SVD Continue follow up with pain management Dr. Orpah Melter   F/u 6 months Dennie Bible, [facility], [facility], [facility] [address] [place], [place] [address] [phone]

## 2017-10-03 ENCOUNTER — Encounter: Payer: Self-pay | Admitting: Nurse Practitioner

## 2017-10-03 ENCOUNTER — Ambulatory Visit (INDEPENDENT_AMBULATORY_CARE_PROVIDER_SITE_OTHER): Payer: Medicare Other | Admitting: Nurse Practitioner

## 2017-10-03 VITALS — BP 122/69 | HR 79 | Ht 64.0 in | Wt 130.0 lb

## 2017-10-03 DIAGNOSIS — F0390 Unspecified dementia without behavioral disturbance: Secondary | ICD-10-CM

## 2017-10-03 DIAGNOSIS — G3184 Mild cognitive impairment, so stated: Secondary | ICD-10-CM | POA: Insufficient documentation

## 2017-10-03 DIAGNOSIS — R269 Unspecified abnormalities of gait and mobility: Secondary | ICD-10-CM | POA: Diagnosis not present

## 2017-10-03 DIAGNOSIS — M48061 Spinal stenosis, lumbar region without neurogenic claudication: Secondary | ICD-10-CM

## 2017-10-03 HISTORY — DX: Unspecified dementia, unspecified severity, without behavioral disturbance, psychotic disturbance, mood disturbance, and anxiety: F03.90

## 2017-10-03 NOTE — Patient Instructions (Addendum)
Memory score is stable Continue Neurontin at current dose does not need refills  Continue  Aspirin Continue follow up with pain management Dr. Orpah Melter   F/u 6 months

## 2017-10-04 DIAGNOSIS — M5136 Other intervertebral disc degeneration, lumbar region: Secondary | ICD-10-CM | POA: Diagnosis not present

## 2017-10-04 DIAGNOSIS — M5416 Radiculopathy, lumbar region: Secondary | ICD-10-CM | POA: Diagnosis not present

## 2017-10-07 NOTE — Progress Notes (Signed)
I have reviewed and agreed above plan. 

## 2017-10-18 ENCOUNTER — Other Ambulatory Visit: Payer: Self-pay | Admitting: Internal Medicine

## 2017-10-19 NOTE — Telephone Encounter (Signed)
Sent to the pharmacy by e-scribe. 

## 2017-11-08 DIAGNOSIS — M47816 Spondylosis without myelopathy or radiculopathy, lumbar region: Secondary | ICD-10-CM | POA: Diagnosis not present

## 2017-11-08 DIAGNOSIS — M5416 Radiculopathy, lumbar region: Secondary | ICD-10-CM | POA: Diagnosis not present

## 2017-11-08 DIAGNOSIS — M5136 Other intervertebral disc degeneration, lumbar region: Secondary | ICD-10-CM | POA: Diagnosis not present

## 2017-11-08 DIAGNOSIS — M961 Postlaminectomy syndrome, not elsewhere classified: Secondary | ICD-10-CM | POA: Diagnosis not present

## 2017-11-08 DIAGNOSIS — M533 Sacrococcygeal disorders, not elsewhere classified: Secondary | ICD-10-CM | POA: Diagnosis not present

## 2017-11-08 DIAGNOSIS — I10 Essential (primary) hypertension: Secondary | ICD-10-CM | POA: Diagnosis not present

## 2017-12-01 DIAGNOSIS — M47816 Spondylosis without myelopathy or radiculopathy, lumbar region: Secondary | ICD-10-CM | POA: Diagnosis not present

## 2017-12-01 DIAGNOSIS — M5126 Other intervertebral disc displacement, lumbar region: Secondary | ICD-10-CM | POA: Diagnosis not present

## 2017-12-01 DIAGNOSIS — M5136 Other intervertebral disc degeneration, lumbar region: Secondary | ICD-10-CM | POA: Diagnosis not present

## 2017-12-01 DIAGNOSIS — M533 Sacrococcygeal disorders, not elsewhere classified: Secondary | ICD-10-CM | POA: Diagnosis not present

## 2017-12-01 DIAGNOSIS — M961 Postlaminectomy syndrome, not elsewhere classified: Secondary | ICD-10-CM | POA: Diagnosis not present

## 2017-12-01 DIAGNOSIS — M5416 Radiculopathy, lumbar region: Secondary | ICD-10-CM | POA: Diagnosis not present

## 2017-12-20 DIAGNOSIS — M47816 Spondylosis without myelopathy or radiculopathy, lumbar region: Secondary | ICD-10-CM | POA: Diagnosis not present

## 2017-12-27 DIAGNOSIS — M47816 Spondylosis without myelopathy or radiculopathy, lumbar region: Secondary | ICD-10-CM | POA: Diagnosis not present

## 2018-01-04 DIAGNOSIS — Z23 Encounter for immunization: Secondary | ICD-10-CM | POA: Diagnosis not present

## 2018-01-19 DIAGNOSIS — M5136 Other intervertebral disc degeneration, lumbar region: Secondary | ICD-10-CM | POA: Diagnosis not present

## 2018-01-19 DIAGNOSIS — M5126 Other intervertebral disc displacement, lumbar region: Secondary | ICD-10-CM | POA: Diagnosis not present

## 2018-01-19 DIAGNOSIS — M47816 Spondylosis without myelopathy or radiculopathy, lumbar region: Secondary | ICD-10-CM | POA: Diagnosis not present

## 2018-01-19 DIAGNOSIS — M533 Sacrococcygeal disorders, not elsewhere classified: Secondary | ICD-10-CM | POA: Diagnosis not present

## 2018-01-19 DIAGNOSIS — M5416 Radiculopathy, lumbar region: Secondary | ICD-10-CM | POA: Diagnosis not present

## 2018-01-19 DIAGNOSIS — M961 Postlaminectomy syndrome, not elsewhere classified: Secondary | ICD-10-CM | POA: Diagnosis not present

## 2018-03-04 ENCOUNTER — Other Ambulatory Visit: Payer: Self-pay | Admitting: Internal Medicine

## 2018-03-07 ENCOUNTER — Ambulatory Visit: Payer: Medicare Other | Admitting: Internal Medicine

## 2018-03-10 NOTE — Progress Notes (Signed)
Chief Complaint  Patient presents with  . Annual Exam    No new concerns.   . Medication Management  . Osteoarthritis  . Hyperlipidemia    HPI: Susan Ford 84 y.o. comes in today for yearly visit   Chronic disease management    PN: and hx of spinal stenosis She takes gabapentin   Pain management    And neurology Last neurology  visit was June  2019  And due 12 19  Memory  Seems stable Lipids  No se noted of med   .  Nf today had  Slimfast at 730  On hrt    Wants to stay on hrt .  Low dose  Weekly  No fall   Taking Celebrex daily uncertain how much it helps jer  Health Maintenance  Topic Date Due  . TETANUS/TDAP  11/07/2021  . INFLUENZA VACCINE  Completed  . DEXA SCAN  Completed  . PNA vac Low Risk Adult  Completed   Health Maintenance Review LIFESTYLE:  Exercise:   Own house work with resting.   Limiting.  Tobacco/ETS:  no Alcohol:  1 most nights   Sugar beverages: mostly water  Sleep:   6-7  Drug use:  Supplements  HH: 1 no pets  Avoids driving at night.    Hearing: ok   Vision:  No limitations at present . Last eye check due next month   Safety:  Has smoke detector and wears seat belts.  No firearms. No excess sun exposure. Sees dentist regularly.  Falls: n  Memory: F stable  Depression: No anhedonia unusual crying or depressive symptoms  Nutrition: Eats well balanced diet; adequate calcium and vitamin D. No swallowing chewing problems.    Other healthcare providers:  Reviewed today .  Preventive parameters: up-to-date  Reviewed   ADLS:   There are no problems or need for assistance  driving, feeding, obtaining food, dressing, toileting and bathing, managing money using phone. She is independent.  Avoids driving at night    ROS:  GEN/ HEENT: No fever, significant weight changes sweats headaches vision problems hearing changes, CV/ PULM; No chest pain shortness of breath cough, syncope,edema  change in exercise tolerance. GI /GU: No  adominal pain, vomiting, change in bowel habits. No blood in the stool. No significant GU symptoms. SKIN/HEME: ,no acute skin rashes suspicious lesions or bleeding. No lymphadenopathy, nodules, masses.  NEURO/ PSYCH:  See [facility] and  Neuropathy No depression anxiety. IMM/ Allergy: No unusual infections.  Allergy .   REST of 12 system review negative except as per HPI   Past Medical History:  Diagnosis Date  . Arthritis   . Cataract 2007   bilateral cat ext   . Cholecystitis   . Complication of anesthesia Feb 21, 2014   atrial fib after cholecystectomy, lasted a few hours, none since  . Dysrhythmia 02-21-2014   after cholecystectomy had atrial fib for a few hours, none since  . GERD (gastroesophageal reflux disease)    Had endoscopy no history of Barrett's  . Hx of nonmelanoma skin cancer   . Unspecified hereditary and idiopathic peripheral neuropathy     Family History  Problem Relation Age of Onset  . Neurodegenerative disease Mother        Susan Ford died at 93  . Alcohol abuse Unknown   . Heart disease Unknown     Social History   Socioeconomic History  . Marital status: Widowed    Spouse name: Gwyndolyn Saxon  .  Number of children: Not on file  . Years of education: Not on file  . Highest education level: Not on file  Occupational History  . Occupation: Retired    Comment: Animal nutritionist  . Financial resource strain: Not on file  . Food insecurity:    Worry: Not on file    Inability: Not on file  . Transportation needs:    Medical: Not on file    Non-medical: Not on file  Tobacco Use  . Smoking status: Former Smoker    Packs/day: 0.25    Years: 7.00    Pack years: 1.75    Types: Cigarettes    Last attempt to quit: 04/27/1963    Years since quitting: 54.9  . Smokeless tobacco: Never Used  Substance and Sexual Activity  . Alcohol use: Yes    Alcohol/week: 7.0 standard drinks    Types: 7 Glasses of wine per week    Comment: 1 glass of wine daily  . Drug  use: No    Types: Hydrocodone  . Sexual activity: Yes  Lifestyle  . Physical activity:    Days per week: Not on file    Minutes per session: Not on file  . Stress: Not on file  Relationships  . Social connections:    Talks on phone: Not on file    Gets together: Not on file    Attends religious service: Not on file    Active member of club or organization: Not on file    Attends meetings of clubs or organizations: Not on file    Relationship status: Not on file  Other Topics Concern  . 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 ascorbic acid (VITAMIN C) 1000 MG tablet Take 1,000 mg by mouth daily after lunch.   Marland Kitchen aspirin 81 MG tablet Take 81 mg by mouth daily after lunch.   . calcium citrate-vitamin D (CITRACAL+D) 315-200 MG-UNIT per tablet Take 1 tablet by mouth daily.   . celecoxib (CELEBREX) 200 MG capsule Take 1 capsule (200 mg total) by mouth daily.  Marland Kitchen estradiol (CLIMARA - DOSED IN MG/24 HR) 0.025 mg/24hr patch PLACE 1 PATCH ONTO THE SKIN ONCE A WEEK. ON SATURDAY  . famotidine (PEPCID) 20 MG tablet Take 20 mg by mouth daily.  Marland Kitchen gabapentin (NEURONTIN) 600 MG tablet Take 1 tablet (600 mg total) by mouth 4 (four) times daily.  . Multiple Vitamin (MULTIVITAMIN) tablet Take 1 tablet by mouth daily after lunch.   . simvastatin (ZOCOR) 10 MG tablet TAKE 1 TABLET (10 MG TOTAL) BY MOUTH AT BEDTIME.   No facility-administered encounter medications on file as of 03/13/2018.     EXAM:  BP 130/80 (BP Location: Right Arm, Patient Position: Sitting, Cuff Size: Normal)   Pulse 83   Temp 98.3 F (36.8 C) (Oral)   Wt 127 lb (57.6 kg)   BMI 21.80 kg/m   Body mass index is 21.8 kg/m.  Physical Exam: Vital signs reviewed GHW:EXHB is a well-developed well-nourished alert cooperative    who appears stated age in no acute distress.  HEENT: normocephalic atraumatic , Eyes: PERRL EOM's full, conjunctiva clear, Nares: paten,t no deformity discharge or tenderness., Ears:  no deformity EAC's clear TMs with normal landmarks. Mouth: clear OP, no lesions, edema.  Moist mucous membranes. Dentition in adequate repair. NECK: supple without masses, thyromegaly or bruits. CHEST/PULM:  Clear to auscultation and percussion breath sounds equal no wheeze , rales or rhonchi. Mild kyphoaia CV: PMI is nondisplaced, S1 S2 no gallops, murmurs, rubs. Peripheral pulses are without delay.No JVD .  ABDOMEN: Bowel sounds normal nontender  No guard or rebound, no hepato splenomegal no CVA tenderness.   Extremtities:  No clubbing cyanosis or edema, no acute joint swelling or redness no focal atrophy  Has OA DJD changes  NEURO:  Oriented x3, cranial nerves 3-12 appear to be intact, no obvious focal weakness,gait independent   Slightly caution and forward SKIN: No acute rashes normal turgor, color, no bruising or petechiae. PSYCH: Oriented, good eye contact, no obvious depression anxiety,  Gross cognition ( not tested ) and judgment appear normal. LN: no cervical axillary  adenopathy No noted deficits in  attention, and speech.   Lab Results  Component Value Date   WBC 7.6 07/05/2017   HGB 13.6 07/05/2017   HCT 40.0 07/05/2017   PLT 249 07/05/2017   GLUCOSE 99 07/05/2017   CHOL 185 03/04/2017   TRIG 116.0 03/04/2017   HDL 75.10 03/04/2017   LDLDIRECT 153 02/12/2009   LDLCALC 87 03/04/2017   ALT 14 07/05/2017   AST 26 07/05/2017   NA 138 07/05/2017   K 4.4 07/05/2017   CL 102 07/05/2017   CREATININE 0.70 07/05/2017   BUN 15 07/05/2017   CO2 24 07/05/2017   TSH 0.89 07/16/2016   INR 0.91 07/05/2017   HGBA1C 5.6 02/18/2016    ASSESSMENT AND PLAN:  Discussed the following assessment and plan:  Hyperlipidemia, unspecified hyperlipidemia type - Plan: Lipid panel, Basic metabolic panel, Hepatic  function panel  Medication management - Plan: Lipid panel, Basic metabolic panel, Hepatic function panel  Hereditary and idiopathic peripheral neuropathy - Plan: Lipid panel, Basic metabolic panel, Hepatic function panel  Spinal stenosis of lumbar region, unspecified whether neurogenic claudication present Lab NF today  But eaiser to do labs   Risk benefit of medication discussed.  hrt and Celebrex    willcontkinue for now  Patient want to remain on hrt     May try off and on to see how much Celebrex is helping Pharmacy to electronically request med refills  Total visit 45mins > 50% spent counseling and coordinating care as indicated in above note and in instructions to patient .   Patient Care Team: Panosh, Standley Brooking, MD as PCP - General Martinique, Amy, MD as Consulting Physician (Dermatology) Marcial Pacas, MD as Consulting Physician (Neurology) Kristeen Miss, MD as Consulting Physician (Neurosurgery)  Patient Instructions  Will notify you  of labs when available. Non fasting today   Celebrex can help pain of arthritis  But has risk of   Kidney damage  So advise take only that which helps your day to day  Living and pain.   If not helping then no reason to take  This .   Get gabapenting refills from neurology .  Will notify you  of labs when available.  Will   Continue HRT but estrogen has some risk   If all ok then advise  6 months med check if you continue to take the Celebrex to monitor lab tests .  Standley Brooking. Panosh M.D.

## 2018-03-13 ENCOUNTER — Encounter: Payer: Self-pay | Admitting: Internal Medicine

## 2018-03-13 ENCOUNTER — Ambulatory Visit (INDEPENDENT_AMBULATORY_CARE_PROVIDER_SITE_OTHER): Payer: Medicare Other | Admitting: Internal Medicine

## 2018-03-13 VITALS — BP 130/80 | HR 83 | Temp 98.3°F | Wt 127.0 lb

## 2018-03-13 DIAGNOSIS — Z79899 Other long term (current) drug therapy: Secondary | ICD-10-CM

## 2018-03-13 DIAGNOSIS — G609 Hereditary and idiopathic neuropathy, unspecified: Secondary | ICD-10-CM | POA: Diagnosis not present

## 2018-03-13 DIAGNOSIS — M48061 Spinal stenosis, lumbar region without neurogenic claudication: Secondary | ICD-10-CM | POA: Diagnosis not present

## 2018-03-13 DIAGNOSIS — E785 Hyperlipidemia, unspecified: Secondary | ICD-10-CM

## 2018-03-13 LAB — HEPATIC FUNCTION PANEL
ALT: 9 U/L (ref 0–35)
AST: 17 U/L (ref 0–37)
Albumin: 4.2 g/dL (ref 3.5–5.2)
Alkaline Phosphatase: 47 U/L (ref 39–117)
Bilirubin, Direct: 0.1 mg/dL (ref 0.0–0.3)
Total Bilirubin: 0.5 mg/dL (ref 0.2–1.2)
Total Protein: 6.3 g/dL (ref 6.0–8.3)

## 2018-03-13 LAB — BASIC METABOLIC PANEL
BUN: 14 mg/dL (ref 6–23)
CO2: 26 mEq/L (ref 19–32)
Calcium: 9.1 mg/dL (ref 8.4–10.5)
Chloride: 101 mEq/L (ref 96–112)
Creatinine, Ser: 0.61 mg/dL (ref 0.40–1.20)
GFR: 98.68 mL/min (ref >60.00)
Glucose, Bld: 93 mg/dL (ref 70–99)
Potassium: 4.5 mEq/L (ref 3.5–5.1)
Sodium: 136 mEq/L (ref 135–145)

## 2018-03-13 LAB — LIPID PANEL
Cholesterol: 162 mg/dL (ref 0–200)
HDL: 66.8 mg/dL (ref >39.00)
LDL Cholesterol: 60 mg/dL (ref 0–99)
NonHDL: 95.29
Total CHOL/HDL Ratio: 2
Triglycerides: 176 mg/dL — ABNORMAL HIGH (ref 0.0–149.0)
VLDL: 35.2 mg/dL (ref 0.0–40.0)

## 2018-03-13 NOTE — Patient Instructions (Addendum)
Will notify you  of labs when available. Non fasting today   Celebrex can help pain of arthritis  But has risk of   Kidney damage  So advise take only that which helps your day to day  Living and pain.   If not helping then no reason to take  This .   Get gabapenting refills from neurology .  Will notify you  of labs when available.  Will   Continue HRT but estrogen has some risk   If all ok then advise  6 months med check if you continue to take the Celebrex to monitor lab tests .

## 2018-04-07 ENCOUNTER — Other Ambulatory Visit: Payer: Self-pay | Admitting: Internal Medicine

## 2018-04-11 NOTE — Progress Notes (Signed)
[facility]  PATIENT: Susan Ford DOB: [birth year]   REASON FOR VISIT: followup for low back pain, [facility] HISTORY FROM: Patient    HISTORY OF PRESENT ILLNESS:  Susan Ford is a 84 year-old right-handed white female,She was a patient of Dr. Erling Ford, last visit was May 2014   She had a history of lumbar decompression surgery, presenting with chronic low back pain, mild gait difficulty due to pain.  MRI of the lumbar spine 12/25/2010 shows diffuse spondylitic changes and progression of degenerative changes at L3-4 and L4-5 and modest broad disc bulge at L5-S1. There is evidence of neuroforaminal stenosis on the left at L4-5. She has pain in her feet and aching from her knees down.  EMG/NCV 12/24/2011 showing progression of peripheral neuropathy versus a prior study 09/13/2011. There was also a mild overlying acute L5 radiculopathy on the left. She underwent surgical decompression at L4-5 and L5-S1 with bilateral foraminotomies and microdiscectomy on the left at L4-5 02/01/2012 by Dr. Ellene Ford, Surgery helped her. She still complains of pain at low back pain, waist line, going down her leg, at both leg.  She denies bowel/bladder incontinence  She has difficulty walking, because of low back pain, worst pain in the morning, she usually take her hydrocodone early morning, then napping for a while, then she can function again.  Previously she has tried Lyrica which caused abdominal cramps, Cymbalta, caused nausea. She had side effects from nortriptyline 20 mg per night. She is on gabapentin 600, 1-1/2 tablets 4 times per day and complains of memory loss on the medication and now questions any improvement in pain. She uses a cane, but not in her house.   She underwent an epidural steroid injection 10/2011 with improvement in her back pain.   She is independent in activities of daily living including driving a car, cooking,cleaning, shopping, and doing  laundry.   UPDATE April 20th 2016:YY Last epidural injection was in 2015, she still has a lot of low back pain, she was put on nortriptyline 25 mg qhs, neurontin 641m qid, hydrocodone prn, celebrex 208mdaily.  We have reviewed MRI lumbar in Feb 2016: Multilevel degenerative disc disease, with mild spinal stenosis, facet arthropathy, mild to moderate foraminal stenosis at different level She complains of constant low back pian, bilatera legs achy pain, triggered by standing for just 10 minutes, she has to sit down resting after standing or walking for short period of time  She is taking Gabapentin 600 mg 4 times a day, Celexa 200 mg every day, recently started nortriptyline 25 mg every night hydrocodone as needed previously tried Cymbalta, without helping her symptoms, untolerable side effect  UPDATE Jan 14 2015: YY She is alone at visit, she still takes celebrex 20086mid, Neurontin 600 mg 4 times a day, she could stand for 10 mintes, then has to sit down because of her low back pain, she also complains of intermittent bilateral lower extremity deep achy pain, She rarely needs narcotics now, denies bowel and bladder incontinence, no bilateral lower extremity numbness  UPDATE Sept 6th 2017:YY Last clinical visit was with Susan Ford June 2017, she is concerned about her memory loss, Laboratory evaluation in June 2017 showed normal TSH, B12, ESR, negative RPR, mild elevated glucose 109 on BMP, I have personally reviewed MRI of the brain in July 2017, moderate generalized atrophy, mild supratentorium small vessel disease,  She lives alone, still drives, but reported she got lost sometimes, today's Mini-Mental status examination 27 out  of 46  UPDATE June 29 2016:YY She is alone at today's clinical visit, we have personally reviewed MRI of the brain, moderate generalized atrophy, small supratentorium small vessel disease, today she denies significant memory loss, lives alone at her house,  still driving,  Her low back pain has improved, stay at low back, denies radiating pain.  MRI of lumbar spine demonstrate multilevel degenerative changes, moderate bilateral foraminal stenosis at L5-S1, moderate stenosis at L 4-5, prior left L5 laminectomy  UPDATE Mar 22 2017:YYShe complains of worsening low back pain and bilateral foot, distal deep achy pain,  She is now taking gabapentin 660m qid, celebrex 2079mqday, she sleeps well, worsening pain after bearing weight,  UPDATE 6/10/2019CM Susan Ford, [id number] old female returns for follow-up with history of low back pain and mild cognitive impairment.  She is currently on Neurontin 600 mg 4 times daily and Celebrex daily.  She also goes to pain management for injections Dr. BaOrpah Ford She was started on Cymbalta at her last visit however she never got the medication filled.  Her back pain is fairly stable.  She continues to be independent with activities of daily living.  She continues to drive without difficulty.  She returns for reevaluation UPDATE 12/18/2019CM [name] old female returns for follow-up and a history of low back pain and mild cognitive impairment.  She remains on Neurontin 600 mg 4 times daily and Celebrex with fair control of her symptoms.  She also sees Dr. BaOrpah Melteror pain management injections.  Her back pain is fairly stable.  She continues to be independent with activities of daily living.  She continues to live alone.  She continues to drive without difficulty.  She has no new neurologic complaints.  She reports that her memory is stable.  She returns for reevaluation  REVIEW OF SYSTEMS: Full 14 system review of systems performed and notable only for those listed, all others are neg:  Constitutional: neg  Cardiovascular: neg Ear/Nose/Throat: neg  Skin: neg Eyes: neg Respiratory: neg Gastroitestinal: neg  Hematology/Lymphatic: neg  Endocrine: neg Musculoskeletal: Back pain Allergy/Immunology:  neg Neurological: Mild cognitive impairment Psychiatric: neg Sleep : neg   ALLERGIES: Allergies  Allergen Reactions  . Keflex [Cephalexin]     Dizziness  . Lyrica [Pregabalin] Other (See Comments)    "High Fever"   . Oxytetracycline Other (See Comments)    "High Fever" caused by terramycin  . Memantine Rash    HOME MEDICATIONS: Outpatient Medications Prior to Visit  Medication Sig Dispense Refill  . ascorbic acid (VITAMIN C) 1000 MG tablet Take 1,000 mg by mouth daily after lunch.     . Marland Kitchenspirin 81 MG tablet Take 81 mg by mouth daily after lunch.     . calcium citrate-vitamin D (CITRACAL+D) 315-200 MG-UNIT per tablet Take 1 tablet by mouth daily.     . celecoxib (CELEBREX) 200 MG capsule Take 1 capsule (200 mg total) by mouth daily. 90 capsule 0  . estradiol (CLIMARA - DOSED IN MG/24 HR) 0.025 mg/24hr patch PLACE 1 PATCH ONTO THE SKIN ONCE A WEEK. ON SATURDAY 4 patch 2  . famotidine (PEPCID) 20 MG tablet Take 20 mg by mouth daily.    . Marland Kitchenabapentin (NEURONTIN) 600 MG tablet Take 1 tablet (600 mg total) by mouth 4 (four) times daily. 360 tablet 4  . Multiple Vitamin (MULTIVITAMIN) tablet Take 1 tablet by mouth daily after lunch.     . simvastatin (ZOCOR) 10 MG tablet TAKE 1 TABLET (10 MG TOTAL) BY MOUTH  AT BEDTIME. 90 tablet 0   No facility-administered medications prior to visit.     PAST MEDICAL HISTORY: Past Medical History:  Diagnosis Date  . Arthritis   . Cataract 2007   bilateral cat ext   . Cholecystitis   . Complication of anesthesia Feb 21, 2014   atrial fib after cholecystectomy, lasted a few hours, none since  . Dysrhythmia 02-21-2014   after cholecystectomy had atrial fib for a few hours, none since  . GERD (gastroesophageal reflux disease)    Had endoscopy no history of Barrett's  . Hx of nonmelanoma skin cancer   . Unspecified hereditary and idiopathic peripheral neuropathy     PAST SURGICAL HISTORY: Past Surgical History:  Procedure Laterality Date  .  ABDOMINAL HYSTERECTOMY  15 yrs ago   complete  . BREAST CYST ASPIRATION    . CHOLECYSTECTOMY N/A 02/21/2014   Procedure: LAPAROSCOPIC CHOLECYSTECTOMY WITH INTRAOPERATIVE CHOLANGIOGRAM;  Surgeon: Doreen Salvage, MD;  Location: [facility];  Service: General;  Laterality: N/A;  . ENDOSCOPIC RETROGRADE CHOLANGIOPANCREATOGRAPHY (ERCP) WITH PROPOFOL N/A 05/29/2014   Procedure: ENDOSCOPIC RETROGRADE CHOLANGIOPANCREATOGRAPHY (ERCP) WITH PROPOFOL;  Surgeon: Arta Silence, MD;  Location: [facility];  Service: Endoscopy;  Laterality: N/A;  . ERCP N/A 02/24/2014   Procedure: ENDOSCOPIC RETROGRADE CHOLANGIOPANCREATOGRAPHY (ERCP);  Surgeon: Arta Silence, MD;  Location: [facility];  Service: Endoscopy;  Laterality: N/A;  . EYE SURGERY Bilateral 2007   both eyes cataracts with lens replacments  . LUMBAR LAMINECTOMY/DECOMPRESSION MICRODISCECTOMY  02/01/2012   Procedure: LUMBAR LAMINECTOMY/DECOMPRESSION MICRODISCECTOMY 2 LEVELS;  Surgeon: Kristeen Miss, MD;  Location: [facility];  Service: Neurosurgery;  Laterality: Left;  Left Lumbar Four-Five Lumbar Five-Sacral One Laminectomies/Foraminotomies/Microscope  . SPYGLASS CHOLANGIOSCOPY N/A 05/29/2014   Procedure: SPYGLASS CHOLANGIOSCOPY;  Surgeon: Arta Silence, MD;  Location: [facility];  Service: Endoscopy;  Laterality: N/A;  . TONSILLECTOMY  age 28    FAMILY HISTORY: Family History  Problem Relation Age of Onset  . Neurodegenerative disease Mother        Mikey Bussing Drager died at 12  . Alcohol abuse Other   . Heart disease Other     SOCIAL HISTORY: Social History   Socioeconomic History  . Marital status: Widowed    Spouse name: Gwyndolyn Saxon  . Number of children: Not on file  . Years of education: Not on file  . Highest education level: Not on file  Occupational History  . Occupation: Retired    Comment: Animal nutritionist  . Financial resource strain: Not on file  . Food insecurity:    Worry: Not on file    Inability: Not on file  . Transportation needs:     Medical: Not on file    Non-medical: Not on file  Tobacco Use  . Smoking status: Former Smoker    Packs/day: 0.25    Years: 7.00    Pack years: 1.75    Types: Cigarettes    Last attempt to quit: 04/27/1963    Years since quitting: 54.9  . Smokeless tobacco: Never Used  Substance and Sexual Activity  . Alcohol use: Yes    Alcohol/week: 7.0 standard drinks    Types: 7 Glasses of wine per week    Comment: 1 glass of wine daily  . Drug use: No    Types: Hydrocodone  . Sexual activity: Yes  Lifestyle  . Physical activity:    Days per week: Not on file    Minutes per session: Not on file  . Stress: Not on file  Relationships  . Social connections:    Talks on phone: Not on file    Gets together: Not on file    Attends religious service: Not on file    Active member of club or organization: Not on file    Attends meetings of clubs or organizations: Not on file    Relationship status: Not on file  . Intimate partner violence:    Fear of current or ex partner: Not on file    Emotionally abused: Not on file    Physically abused: Not on file    Forced sexual activity: Not on file  Other Topics Concern  . Not on file  Social History Narrative   Retired Therapist, sports    widowed since    Spring  17    [employer] of 2    Regular exercise former smoker   G3P3   From [place] originally         PHYSICAL EXAM  Vitals:   04/12/18 0955  BP: 128/77  Pulse: 99  Weight: 127 lb 3.2 oz (57.7 kg)  Height: 5' 4" (1.626 m)   Body mass index is 21.83 kg/m.  Generalized: Well developed, in no acute distress  Head: normocephalic and atraumatic,. Oropharynx benign  Neck: Supple, no carotid bruits  Cardiac: Regular rate rhythm, no murmur  Musculoskeletal: No deformity   Neurological examination   Mentation: Alert  Clock drawing 4/4 MMSE - Mini Mental State Exam 04/12/2018 10/03/2017 05/11/2017 06/29/2016 12/31/2015  Not completed: (No Data) - (No Data) - -  Orientation to time 4 5 - 5 5  Orientation  to Place 5 4 - 5 5  Registration 3 3 - 3 3  Attention/ Calculation 4 5 - 5 5  Recall 2 1 - 3 0  Language- name 2 objects 2 2 - 2 2  Language- repeat 1 1 - 1 1  Language- follow 3 step command 3 3 - 3 3  Language- read & follow direction 1 1 - 1 1  Write a sentence 1 1 - 1 1  Copy design 1 1 - 1 1  Total score 27 27 - 30 27  Follows all commands speech and language fluent.   Cranial nerve II-XII: Pupils were equal round reactive to light extraocular movements were full, visual field were full on confrontational test. Facial sensation and strength were normal. hearing was intact to finger rubbing bilaterally. Uvula tongue midline. head turning and shoulder shrug were normal and symmetric.Tongue protrusion into cheek strength was normal. Motor: normal bulk and tone, full strength in the BUE, BLE,  Sensory: normal and symmetric to light touch, pinprick, and  Vibration, in the lower extremities except the decreased vibratory to both ankles Coordination: finger-nose-finger, heel-to-shin bilaterally, no dysmetria Reflexes: Symmetric upper and lower, plantar responses were flexor bilaterally. Gait and Station: Rising up from seated position without assistance, stooped posture Mild antalgic cautious gait, no assistive device, Romberg absent DIAGNOSTIC DATA (LABS, IMAGING, TESTING) - I reviewed patient records, labs, notes, testing and imaging myself where available.  Lab Results  Component Value Date   WBC 7.6 07/05/2017   HGB 13.6 07/05/2017   HCT 40.0 07/05/2017   MCV 94.0 07/05/2017   PLT 249 07/05/2017      Component Value Date/Time   NA 136 03/13/2018 1140   NA 133 (A) 07/16/2016   K 4.5 03/13/2018 1140   CL 101 03/13/2018 1140   CO2 26 03/13/2018 1140   GLUCOSE 93 03/13/2018 1140   BUN 14  03/13/2018 1140   BUN 16 07/16/2016   CREATININE 0.61 03/13/2018 1140   CALCIUM 9.1 03/13/2018 1140   PROT 6.3 03/13/2018 1140   ALBUMIN 4.2 03/13/2018 1140   AST 17 03/13/2018 1140   ALT 9  03/13/2018 1140   ALKPHOS 47 03/13/2018 1140   BILITOT 0.5 03/13/2018 1140   GFRNONAA >60 07/05/2017 1813   GFRAA >60 07/05/2017 1813   Lab Results  Component Value Date   CHOL 162 03/13/2018   HDL 66.80 03/13/2018   LDLCALC 60 03/13/2018   LDLDIRECT 153 02/12/2009   TRIG 176.0 (H) 03/13/2018   CHOLHDL 2 03/13/2018   Lab Results  Component Value Date   HGBA1C 5.6 02/18/2016   Lab Results  Component Value Date   CVELFYBO17 510 10/15/2015   Lab Results  Component Value Date   TSH 0.89 07/16/2016      ASSESSMENT AND PLAN  Susan Ford is a 84 year old female with mild cognitive impairment-, evidence of  at least moderate generalized atrophy, mild supratentorium small vessel disease and chronic low back pain with lumbar radiculopathy, mild foot numbness mild gait abnormality.                PLAN: Memory score is stable she has failed Namenda in the past.   Continue Neurontin at current dose  Continue  Aspirin for SVD Continue follow up with pain management Dr. Orpah Ford   F/u 6 mo to 8 mo Dennie Bible, [facility], [facility], [facility] [address], [place], [place] [address] [phone]

## 2018-04-12 ENCOUNTER — Ambulatory Visit (INDEPENDENT_AMBULATORY_CARE_PROVIDER_SITE_OTHER): Payer: Medicare Other | Admitting: Nurse Practitioner

## 2018-04-12 ENCOUNTER — Encounter: Payer: Self-pay | Admitting: Nurse Practitioner

## 2018-04-12 VITALS — BP 128/77 | HR 99 | Ht 64.0 in | Wt 127.2 lb

## 2018-04-12 DIAGNOSIS — G3184 Mild cognitive impairment, so stated: Secondary | ICD-10-CM

## 2018-04-12 DIAGNOSIS — R269 Unspecified abnormalities of gait and mobility: Secondary | ICD-10-CM | POA: Diagnosis not present

## 2018-04-12 DIAGNOSIS — G609 Hereditary and idiopathic neuropathy, unspecified: Secondary | ICD-10-CM

## 2018-04-12 MED ORDER — GABAPENTIN 600 MG PO TABS: 600.0000 mg | ORAL_TABLET | Freq: Four times a day (QID) | ORAL | 3 refills | Status: DC

## 2018-04-12 NOTE — Patient Instructions (Signed)
Memory score is stable she has failed Namenda in the past.   Continue Neurontin at current dose  Continue  Aspirin for SVD Continue follow up with pain management Dr. Orpah Melter   F/u 6 mo to 8 mo

## 2018-04-13 NOTE — Progress Notes (Signed)
I have reviewed and agreed above plan. 

## 2018-04-29 ENCOUNTER — Other Ambulatory Visit: Payer: Self-pay | Admitting: Internal Medicine

## 2018-05-01 DIAGNOSIS — M47816 Spondylosis without myelopathy or radiculopathy, lumbar region: Secondary | ICD-10-CM | POA: Diagnosis not present

## 2018-05-01 DIAGNOSIS — M961 Postlaminectomy syndrome, not elsewhere classified: Secondary | ICD-10-CM | POA: Diagnosis not present

## 2018-05-01 DIAGNOSIS — M5136 Other intervertebral disc degeneration, lumbar region: Secondary | ICD-10-CM | POA: Diagnosis not present

## 2018-05-01 DIAGNOSIS — M461 Sacroiliitis, not elsewhere classified: Secondary | ICD-10-CM | POA: Diagnosis not present

## 2018-05-10 DIAGNOSIS — M9905 Segmental and somatic dysfunction of pelvic region: Secondary | ICD-10-CM | POA: Diagnosis not present

## 2018-05-10 DIAGNOSIS — M5137 Other intervertebral disc degeneration, lumbosacral region: Secondary | ICD-10-CM | POA: Diagnosis not present

## 2018-05-10 DIAGNOSIS — M25552 Pain in left hip: Secondary | ICD-10-CM | POA: Diagnosis not present

## 2018-05-10 DIAGNOSIS — M9903 Segmental and somatic dysfunction of lumbar region: Secondary | ICD-10-CM | POA: Diagnosis not present

## 2018-05-12 ENCOUNTER — Ambulatory Visit (INDEPENDENT_AMBULATORY_CARE_PROVIDER_SITE_OTHER): Payer: Medicare Other

## 2018-05-12 VITALS — BP 108/64 | HR 74 | Resp 16 | Ht 62.0 in | Wt 126.0 lb

## 2018-05-12 DIAGNOSIS — Z0000 Encounter for general adult medical examination without abnormal findings: Secondary | ICD-10-CM | POA: Diagnosis not present

## 2018-05-12 NOTE — Progress Notes (Addendum)
Subjective:   Susan Ford is a 85 y.o. female who presents for Medicare Annual (Subsequent) preventive examination.  Review of Systems:  No ROS.  Medicare Wellness Visit. Additional risk factors are reflected in the social history.  Cardiac Risk Factors include: advanced age (>53men, >53 women);dyslipidemia;sedentary lifestyle  Sleep patterns: no sleep issues and feels rested on waking.    Home Safety/Smoke Alarms: Feels safe in home. Smoke alarms in place.  Living environment; residence and Firearm Safety: 1-story house/ trailer. Has stairs to garage, uses electric acorn chair to assist in transferring. Has cane on hand if needed. States she does not think she needs additional DME. Grab bars for bathroom recommended. Seat Belt Safety/Bike Helmet: Wears seat belt, still drives.   Female:   Pap- N/A over 80yo    Mammo- N/A over 80yo      Dexa scan- 01/2015, "could repeat in few years" per report. Pt. Declined to have repeat scan.     CCS- N/A over 80yo     Objective:     Vitals: BP 108/64 (BP Location: Right Arm, Patient Position: Sitting, Cuff Size: Normal)   Pulse 74   Resp 16   Ht 5\' 2"  (1.575 m)   Wt 126 lb (57.2 kg)   SpO2 98%   BMI 23.05 kg/m   Body mass index is 23.05 kg/m.  Advanced Directives 05/12/2018 07/05/2017 05/11/2017 05/11/2017 05/29/2014 05/21/2014 02/21/2014  Does Patient Have a Medical Advance Directive? Yes - Yes Yes Yes Yes Yes  Type of Paramedic of [place];Living will Living will - - [place];Living will [place];Living will [place]  Does patient want to make changes to medical advance directive? - - - - - - No - Patient declined  Copy of [place] in Chart? No - copy requested - - - No - copy requested No - copy requested Yes    Tobacco Social History   Tobacco Use  Smoking Status Former Smoker  . Packs/day: 0.25  . Years: 7.00  . Pack  years: 1.75  . Types: Cigarettes  . Last attempt to quit: 04/27/1963  . Years since quitting: 55.0  Smokeless Tobacco Never Used     Counseling given: Not Answered   Past Medical History:  Diagnosis Date  . Arthritis   . Cataract 2007   bilateral cat ext   . Cholecystitis   . Complication of anesthesia Feb 21, 2014   atrial fib after cholecystectomy, lasted a few hours, none since  . Dysrhythmia 02-21-2014   after cholecystectomy had atrial fib for a few hours, none since  . GERD (gastroesophageal reflux disease)    Had endoscopy no history of Barrett's  . Hx of nonmelanoma skin cancer   . Unspecified hereditary and idiopathic peripheral neuropathy    Past Surgical History:  Procedure Laterality Date  . ABDOMINAL HYSTERECTOMY  15 yrs ago   complete  . BREAST CYST ASPIRATION    . CHOLECYSTECTOMY N/A 02/21/2014   Procedure: LAPAROSCOPIC CHOLECYSTECTOMY WITH INTRAOPERATIVE CHOLANGIOGRAM;  Surgeon: Doreen Salvage, MD;  Location: [facility];  Service: General;  Laterality: N/A;  . ENDOSCOPIC RETROGRADE CHOLANGIOPANCREATOGRAPHY (ERCP) WITH PROPOFOL N/A 05/29/2014   Procedure: ENDOSCOPIC RETROGRADE CHOLANGIOPANCREATOGRAPHY (ERCP) WITH PROPOFOL;  Surgeon: Arta Silence, MD;  [facility];  Service: Endoscopy;  Laterality: N/A;  . ERCP N/A 02/24/2014   Procedure: ENDOSCOPIC RETROGRADE CHOLANGIOPANCREATOGRAPHY (ERCP);  Surgeon: Arta Silence, MD;  Location: [facility] ENDOSCOPY;  Service: Endoscopy;  Laterality: N/A;  . EYE SURGERY Bilateral 2007   both eyes cataracts with lens replacments  . LUMBAR LAMINECTOMY/DECOMPRESSION MICRODISCECTOMY  02/01/2012   Procedure: LUMBAR LAMINECTOMY/DECOMPRESSION MICRODISCECTOMY 2 LEVELS;  Surgeon: Kristeen Miss, MD;  Location: [facility];  Service: Neurosurgery;  Laterality: Left;  Left Lumbar Four-Five Lumbar Five-Sacral One Laminectomies/Foraminotomies/Microscope  . SPYGLASS CHOLANGIOSCOPY N/A 05/29/2014   Procedure: SPYGLASS CHOLANGIOSCOPY;  Surgeon: Arta Silence, MD;  [facility];  Service: Endoscopy;  Laterality: N/A;  . TONSILLECTOMY  age 41   Family History  Problem Relation Age of Onset  . Neurodegenerative disease Mother        Mikey Bussing Drager died at 68  . Alcohol abuse Other   . Heart disease Other    Social History   Socioeconomic History  . Marital status: Widowed    Spouse name: Gwyndolyn Saxon  . Number of children: 3  . Years of education: Not on file  . Highest education level: Not on file  Occupational History  . Occupation: Retired    Comment: Animal nutritionist  . Financial resource strain: Not very hard  . Food insecurity:    Worry: Never true    Inability: Never true  . Transportation needs:    Medical: No    Non-medical: No  Tobacco Use  . Smoking status: Former Smoker    Packs/day: 0.25    Years: 7.00    Pack years: 1.75    Types: Cigarettes    Last attempt to quit: 04/27/1963    Years since quitting: 55.0  . Smokeless tobacco: Never Used  Substance and Sexual Activity  . Alcohol use: Yes    Alcohol/week: 7.0 standard drinks    Types: 7 Glasses of wine per week    Comment: 1 glass of wine daily  . Drug use: No    Types: Hydrocodone  . Sexual activity: Yes  Lifestyle  . Physical activity:    Days per week: 0 days    Minutes per session: 0 min  . Stress: Not at all  Relationships  . Social connections:    Talks on phone: More than three times a week    Gets together: More than three times a week    Attends religious service: More than 4 times per year    Active member of club or organization: Yes    Attends meetings of clubs or organizations: More than 4 times per year    Relationship status: Widowed  Other Topics Concern  . 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 Misses working the floor.           Outpatient Encounter Medications as of 05/12/2018  Medication Sig  . acetaminophen (TYLENOL) 500 MG tablet Take 500 mg by mouth every 6 (six) hours as needed.  Marland Kitchen ascorbic acid (VITAMIN C) 1000 MG tablet Take 1,000 mg by mouth daily after lunch.   Marland Kitchen aspirin 81 MG tablet Take 81 mg by mouth daily after lunch.   Marland Kitchen  calcium citrate-vitamin D (CITRACAL+D) 315-200 MG-UNIT per tablet Take 1 tablet by mouth daily.   . celecoxib (CELEBREX) 200 MG capsule Take 1 capsule (200 mg total) by mouth daily.  Marland Kitchen estradiol (CLIMARA - DOSED IN MG/24 HR) 0.025 mg/24hr patch PLACE 1 PATCH ONTO THE SKIN ONCE A WEEK ON SATURDAY  . famotidine (PEPCID) 20 MG tablet Take 20 mg by mouth daily as needed.   . gabapentin (NEURONTIN) 600 MG tablet Take 1 tablet (600 mg total) by mouth 4 (four) times daily.  . Multiple Vitamin (MULTIVITAMIN) tablet Take 1 tablet by mouth daily after lunch.   . simvastatin (ZOCOR) 10 MG tablet TAKE 1 TABLET (10 MG TOTAL) BY MOUTH AT BEDTIME.   No facility-administered encounter medications on file as of 05/12/2018.     Activities of Daily Living In your present state of health, do you have any difficulty performing the following activities: 05/12/2018 03/13/2018  Hearing? N N  Vision? N N  Difficulty concentrating or making decisions? N N  Walking or climbing stairs? Y N  Dressing or bathing? N N  Doing errands, shopping? N N  Preparing Food and eating ? N -  Using the Toilet? N -  In the past six months, have you accidently leaked urine? N -  Do you have problems with loss of bowel control? N -  Managing your Medications? N -  Managing your Finances? Y -  Comment recent high medical bills. Local county resource provided -  [place], broadcasting/film/video? N -  Comment Has housekeeper  weekly to assist -  Some recent data might be hidden    Patient Care Team: Panosh, Standley Brooking, MD as PCP - General Martinique, Amy, MD as Consulting Physician (Dermatology) Marcial Pacas, MD as Consulting Physician (Neurology) Kristeen Miss, MD as Consulting Physician (Neurosurgery) Marlaine Hind, MD as Consulting Physician (Physical Medicine and Rehabilitation) Jola Baptist, [place] as Referring Physician (Chiropractic Medicine) Webb Laws, [place] as Referring Physician (Optometry)    Assessment:   This is a routine wellness examination for [place]. Physical assessment deferred to PCP.   Exercise Activities and Dietary recommendations Current Exercise Habits: The patient does not participate in regular exercise at present(difficulty walking d/t back pain and neuropathy), Exercise limited by: orthopedic condition(s);neurologic condition(s) Diet (meal preparation, eat out, water intake, caffeinated beverages, dairy products, fruits and vegetables): in general, a "healthy" diet  , on average, 3 meals per day. Drinks milkshake for breakfast, snacks, and has protein/vegetables for dinner usually.  Goals    . Patient Stated     To maintain your health     . Patient Stated     Stay engaged with others;     . Patient Stated     Continue with chiropracter and pain specialist to relieve back pain, maintain good quality of life.       Fall Risk Fall Risk  05/12/2018 03/13/2018 05/11/2017 02/18/2016 01/29/2015  Falls in the past year? 0 0 No No No  Risk for fall due to : Impaired vision;Impaired balance/gait - - - -  Follow up Education provided;Falls prevention discussed - - - -    Depression Screen PHQ 2/9 Scores 05/12/2018 03/13/2018 05/11/2017 02/18/2016  PHQ - 2 Score 0 0 0 0  PHQ- 9 Score 0 - - -     Cognitive Function MMSE - Mini Mental State Exam 04/12/2018 10/03/2017 05/11/2017 06/29/2016 12/31/2015  Not completed: (No Data) - (No Data) - -  Orientation to time 4 5 - 5  5  Orientation to  Place 5 4 - 5 5  Registration 3 3 - 3 3  Attention/ Calculation 4 5 - 5 5  Recall 2 1 - 3 0  Language- name 2 objects 2 2 - 2 2  Language- repeat 1 1 - 1 1  Language- follow 3 step command 3 3 - 3 3  Language- read & follow direction 1 1 - 1 1  Write a sentence 1 1 - 1 1  Copy design 1 1 - 1 1  Total score 27 27 - 30 27       Pt. States she has noticed some short-term recall memory problems, but has not had any incidents affecting safety. MMSE recently completed. Pt. A&OX4 during visit.   Immunization History  Administered Date(s) Administered  . Influenza Split 02/25/2012  . Influenza, High Dose Seasonal PF 01/13/2016, 01/19/2017, 01/04/2018  . Influenza-Unspecified 01/31/2014  . Pneumococcal Conjugate-13 10/23/2013, 03/11/2014  . Pneumococcal Polysaccharide-23 02/18/2016  . Td 05/15/2010  . Tdap 11/08/2011  . Zoster 04/09/2014    Qualifies for Shingles Vaccine? Yes, advised to go to local pharmacy  Screening Tests Health Maintenance  Topic Date Due  . TETANUS/TDAP  11/07/2021  . INFLUENZA VACCINE  Completed  . DEXA SCAN  Completed  . PNA vac Low Risk Adult  Completed       Plan:     Consider getting the shingrix vaccine at your local pharmacy.   Refer to hearing aide and financial resources provided.  Bring a copy of your living will and/or healthcare power of attorney to your next office visit .  Continue maintaining your health and let us know if we can offer any additional assistance with pain relief and/or quality of life.   I have personally reviewed and noted the following in the patient's chart:   . Medical and social history . Use of alcohol, tobacco or illicit drugs  . Current medications and supplements . Functional ability and status . Nutritional status . Physical activity . Advanced directives . List of other physicians . Vitals . Screenings to include cognitive, depression, and falls . Referrals and appointments  In addition, I have  reviewed and discussed with patient certain preventive protocols, quality metrics, and best practice recommendations. A written personalized care plan for preventive services as well as general preventive health recommendations were provided to patient.     Alphia Moh, RN  05/12/2018  Above noted reviewed and agree. Shanon Ace, MD

## 2018-05-12 NOTE — Patient Instructions (Signed)
Consider getting the shingrix vaccine at your local pharmacy.   Refer to hearing aide and financial resources provided.  Bring a copy of your living will and/or healthcare power of attorney to your next office visit.     Susan Ford , Thank you for taking time to come for your Medicare Wellness Visit. I appreciate your ongoing commitment to your health goals. Please review the following plan we discussed and let me know if I can assist you in the future.   These are the goals we discussed: Goals    . Patient Stated     To maintain your health     . Patient Stated     Stay engaged with others;     . Patient Stated     Continue with chiropracter and pain specialist to relieve back pain, maintain good quality of life.       This is a list of the screening recommended for you and due dates:  Health Maintenance  Topic Date Due  . Tetanus Vaccine  11/07/2021  . Flu Shot  Completed  . DEXA scan (bone density measurement)  Completed  . Pneumonia vaccines  Completed

## 2018-05-17 DIAGNOSIS — M9903 Segmental and somatic dysfunction of lumbar region: Secondary | ICD-10-CM | POA: Diagnosis not present

## 2018-05-17 DIAGNOSIS — M5137 Other intervertebral disc degeneration, lumbosacral region: Secondary | ICD-10-CM | POA: Diagnosis not present

## 2018-05-17 DIAGNOSIS — M9905 Segmental and somatic dysfunction of pelvic region: Secondary | ICD-10-CM | POA: Diagnosis not present

## 2018-05-17 DIAGNOSIS — M25552 Pain in left hip: Secondary | ICD-10-CM | POA: Diagnosis not present

## 2018-05-18 DIAGNOSIS — M9903 Segmental and somatic dysfunction of lumbar region: Secondary | ICD-10-CM | POA: Diagnosis not present

## 2018-05-18 DIAGNOSIS — M5137 Other intervertebral disc degeneration, lumbosacral region: Secondary | ICD-10-CM | POA: Diagnosis not present

## 2018-05-18 DIAGNOSIS — M25552 Pain in left hip: Secondary | ICD-10-CM | POA: Diagnosis not present

## 2018-05-18 DIAGNOSIS — M9905 Segmental and somatic dysfunction of pelvic region: Secondary | ICD-10-CM | POA: Diagnosis not present

## 2018-05-22 DIAGNOSIS — M25552 Pain in left hip: Secondary | ICD-10-CM | POA: Diagnosis not present

## 2018-05-22 DIAGNOSIS — M9905 Segmental and somatic dysfunction of pelvic region: Secondary | ICD-10-CM | POA: Diagnosis not present

## 2018-05-22 DIAGNOSIS — M9903 Segmental and somatic dysfunction of lumbar region: Secondary | ICD-10-CM | POA: Diagnosis not present

## 2018-05-22 DIAGNOSIS — M5137 Other intervertebral disc degeneration, lumbosacral region: Secondary | ICD-10-CM | POA: Diagnosis not present

## 2018-05-24 DIAGNOSIS — M5137 Other intervertebral disc degeneration, lumbosacral region: Secondary | ICD-10-CM | POA: Diagnosis not present

## 2018-05-24 DIAGNOSIS — M9905 Segmental and somatic dysfunction of pelvic region: Secondary | ICD-10-CM | POA: Diagnosis not present

## 2018-05-24 DIAGNOSIS — M25552 Pain in left hip: Secondary | ICD-10-CM | POA: Diagnosis not present

## 2018-05-24 DIAGNOSIS — M9903 Segmental and somatic dysfunction of lumbar region: Secondary | ICD-10-CM | POA: Diagnosis not present

## 2018-05-26 DIAGNOSIS — M9903 Segmental and somatic dysfunction of lumbar region: Secondary | ICD-10-CM | POA: Diagnosis not present

## 2018-05-26 DIAGNOSIS — M25552 Pain in left hip: Secondary | ICD-10-CM | POA: Diagnosis not present

## 2018-05-26 DIAGNOSIS — M9905 Segmental and somatic dysfunction of pelvic region: Secondary | ICD-10-CM | POA: Diagnosis not present

## 2018-05-26 DIAGNOSIS — M5137 Other intervertebral disc degeneration, lumbosacral region: Secondary | ICD-10-CM | POA: Diagnosis not present

## 2018-05-29 DIAGNOSIS — M25552 Pain in left hip: Secondary | ICD-10-CM | POA: Diagnosis not present

## 2018-05-29 DIAGNOSIS — M9903 Segmental and somatic dysfunction of lumbar region: Secondary | ICD-10-CM | POA: Diagnosis not present

## 2018-05-29 DIAGNOSIS — M5137 Other intervertebral disc degeneration, lumbosacral region: Secondary | ICD-10-CM | POA: Diagnosis not present

## 2018-05-29 DIAGNOSIS — M9905 Segmental and somatic dysfunction of pelvic region: Secondary | ICD-10-CM | POA: Diagnosis not present

## 2018-06-02 DIAGNOSIS — M9905 Segmental and somatic dysfunction of pelvic region: Secondary | ICD-10-CM | POA: Diagnosis not present

## 2018-06-02 DIAGNOSIS — M9903 Segmental and somatic dysfunction of lumbar region: Secondary | ICD-10-CM | POA: Diagnosis not present

## 2018-06-02 DIAGNOSIS — M25552 Pain in left hip: Secondary | ICD-10-CM | POA: Diagnosis not present

## 2018-06-02 DIAGNOSIS — M5137 Other intervertebral disc degeneration, lumbosacral region: Secondary | ICD-10-CM | POA: Diagnosis not present

## 2018-06-05 DIAGNOSIS — M9905 Segmental and somatic dysfunction of pelvic region: Secondary | ICD-10-CM | POA: Diagnosis not present

## 2018-06-05 DIAGNOSIS — M5137 Other intervertebral disc degeneration, lumbosacral region: Secondary | ICD-10-CM | POA: Diagnosis not present

## 2018-06-05 DIAGNOSIS — M25552 Pain in left hip: Secondary | ICD-10-CM | POA: Diagnosis not present

## 2018-06-05 DIAGNOSIS — M9903 Segmental and somatic dysfunction of lumbar region: Secondary | ICD-10-CM | POA: Diagnosis not present

## 2018-06-07 DIAGNOSIS — M9905 Segmental and somatic dysfunction of pelvic region: Secondary | ICD-10-CM | POA: Diagnosis not present

## 2018-06-07 DIAGNOSIS — M9903 Segmental and somatic dysfunction of lumbar region: Secondary | ICD-10-CM | POA: Diagnosis not present

## 2018-06-07 DIAGNOSIS — M5137 Other intervertebral disc degeneration, lumbosacral region: Secondary | ICD-10-CM | POA: Diagnosis not present

## 2018-06-07 DIAGNOSIS — M25552 Pain in left hip: Secondary | ICD-10-CM | POA: Diagnosis not present

## 2018-06-09 ENCOUNTER — Other Ambulatory Visit: Payer: Self-pay | Admitting: Internal Medicine

## 2018-06-09 DIAGNOSIS — M25552 Pain in left hip: Secondary | ICD-10-CM | POA: Diagnosis not present

## 2018-06-09 DIAGNOSIS — M9903 Segmental and somatic dysfunction of lumbar region: Secondary | ICD-10-CM | POA: Diagnosis not present

## 2018-06-09 DIAGNOSIS — M5137 Other intervertebral disc degeneration, lumbosacral region: Secondary | ICD-10-CM | POA: Diagnosis not present

## 2018-06-09 DIAGNOSIS — M9905 Segmental and somatic dysfunction of pelvic region: Secondary | ICD-10-CM | POA: Diagnosis not present

## 2018-06-10 ENCOUNTER — Other Ambulatory Visit: Payer: Self-pay | Admitting: Internal Medicine

## 2018-06-12 DIAGNOSIS — M9905 Segmental and somatic dysfunction of pelvic region: Secondary | ICD-10-CM | POA: Diagnosis not present

## 2018-06-12 DIAGNOSIS — M5137 Other intervertebral disc degeneration, lumbosacral region: Secondary | ICD-10-CM | POA: Diagnosis not present

## 2018-06-12 DIAGNOSIS — M9903 Segmental and somatic dysfunction of lumbar region: Secondary | ICD-10-CM | POA: Diagnosis not present

## 2018-06-12 DIAGNOSIS — M25552 Pain in left hip: Secondary | ICD-10-CM | POA: Diagnosis not present

## 2018-06-14 DIAGNOSIS — M9905 Segmental and somatic dysfunction of pelvic region: Secondary | ICD-10-CM | POA: Diagnosis not present

## 2018-06-14 DIAGNOSIS — M5137 Other intervertebral disc degeneration, lumbosacral region: Secondary | ICD-10-CM | POA: Diagnosis not present

## 2018-06-14 DIAGNOSIS — M25552 Pain in left hip: Secondary | ICD-10-CM | POA: Diagnosis not present

## 2018-06-14 DIAGNOSIS — M9903 Segmental and somatic dysfunction of lumbar region: Secondary | ICD-10-CM | POA: Diagnosis not present

## 2018-06-15 ENCOUNTER — Other Ambulatory Visit: Payer: Self-pay | Admitting: Internal Medicine

## 2018-06-19 DIAGNOSIS — M5137 Other intervertebral disc degeneration, lumbosacral region: Secondary | ICD-10-CM | POA: Diagnosis not present

## 2018-06-19 DIAGNOSIS — M25552 Pain in left hip: Secondary | ICD-10-CM | POA: Diagnosis not present

## 2018-06-19 DIAGNOSIS — M9903 Segmental and somatic dysfunction of lumbar region: Secondary | ICD-10-CM | POA: Diagnosis not present

## 2018-06-19 DIAGNOSIS — M9905 Segmental and somatic dysfunction of pelvic region: Secondary | ICD-10-CM | POA: Diagnosis not present

## 2018-06-26 DIAGNOSIS — R030 Elevated blood-pressure reading, without diagnosis of hypertension: Secondary | ICD-10-CM | POA: Diagnosis not present

## 2018-06-26 DIAGNOSIS — M961 Postlaminectomy syndrome, not elsewhere classified: Secondary | ICD-10-CM | POA: Diagnosis not present

## 2018-06-26 DIAGNOSIS — M461 Sacroiliitis, not elsewhere classified: Secondary | ICD-10-CM | POA: Diagnosis not present

## 2018-06-26 DIAGNOSIS — M533 Sacrococcygeal disorders, not elsewhere classified: Secondary | ICD-10-CM | POA: Diagnosis not present

## 2018-06-26 DIAGNOSIS — M5416 Radiculopathy, lumbar region: Secondary | ICD-10-CM | POA: Diagnosis not present

## 2018-06-26 DIAGNOSIS — M47816 Spondylosis without myelopathy or radiculopathy, lumbar region: Secondary | ICD-10-CM | POA: Diagnosis not present

## 2018-06-26 DIAGNOSIS — M5136 Other intervertebral disc degeneration, lumbar region: Secondary | ICD-10-CM | POA: Diagnosis not present

## 2018-06-26 DIAGNOSIS — M5126 Other intervertebral disc displacement, lumbar region: Secondary | ICD-10-CM | POA: Diagnosis not present

## 2018-06-27 ENCOUNTER — Other Ambulatory Visit: Payer: Self-pay | Admitting: Internal Medicine

## 2018-06-27 DIAGNOSIS — Z1231 Encounter for screening mammogram for malignant neoplasm of breast: Secondary | ICD-10-CM

## 2018-06-28 DIAGNOSIS — L821 Other seborrheic keratosis: Secondary | ICD-10-CM | POA: Diagnosis not present

## 2018-06-28 DIAGNOSIS — Z85828 Personal history of other malignant neoplasm of skin: Secondary | ICD-10-CM | POA: Diagnosis not present

## 2018-06-28 DIAGNOSIS — L853 Xerosis cutis: Secondary | ICD-10-CM | POA: Diagnosis not present

## 2018-06-28 DIAGNOSIS — L298 Other pruritus: Secondary | ICD-10-CM | POA: Diagnosis not present

## 2018-07-03 ENCOUNTER — Ambulatory Visit (INDEPENDENT_AMBULATORY_CARE_PROVIDER_SITE_OTHER): Payer: Medicare Other | Admitting: Family Medicine

## 2018-07-03 ENCOUNTER — Encounter (INDEPENDENT_AMBULATORY_CARE_PROVIDER_SITE_OTHER): Payer: Self-pay | Admitting: Family Medicine

## 2018-07-03 DIAGNOSIS — M545 Low back pain, unspecified: Secondary | ICD-10-CM

## 2018-07-03 DIAGNOSIS — G8929 Other chronic pain: Secondary | ICD-10-CM

## 2018-07-03 NOTE — Patient Instructions (Signed)
    Considerations:  - Try stopping simvastatin/zocor for a few months to see if neuropathy improves.  - Try eliminating gluten from diet (typically in breads, pastas, baked goods, but need to carefully check all labels for it).  - Take Vitamin D3 at 5,000 IU daily and have levels checked in 3-4 months (target level is 50-80).  - Minimize intake of sugar/sweets.  - Turmeric capsules 500 mg twice daily can help with arthritis pain.

## 2018-07-03 NOTE — Progress Notes (Signed)
Office Visit Note   Patient: Susan Ford           Date of Birth: 1929-05-09           MRN: [id number] Visit Date: 07/03/2018 Requested by: Burnis Medin, MD [place], [place] [address] PCP: Burnis Medin, MD  Subjective: Chief Complaint  Patient presents with  . Lower Back - Pain    Chronic low back pain - referred by Dr. Jola Baptist to get a new brace.    HPI: She is an 85 year old seen at the request of Dr. Jimmye Norman for chronic low back pain.  She has had back problems for many years.  She had laminectomy probably 5 or 6 years ago which did not give her much relief.  She has been to physical therapy, she has had various injections.  Nothing really seem to give her lasting relief.  Her pain is midline lumbosacral area and bothers her as soon as she stands up.  She cannot stand or walk very long before having to sit down again.  She feels better when sitting or lying down.  She recently started seeing Dr. Jimmye Norman and was given a back brace which seems to give her some relief, but because of the structure of the brace, it irritates her rib cage so she cannot wear it.  He is wondering whether another type of brace might help her.  She has had peripheral neuropathy for the past 4 or 5 years.  Etiology is uncertain.  Her symptoms are managed with gabapentin, this works well for her.  No history of diabetes or thyroid dysfunction, no B12 deficiency.  She has had work-up for this in the past.  She has been on simvastatin for many years for hyperlipidemia.  She states that her lipids were never very high, and she has no history of cardiovascular disease.               ROS: Otherwise noncontributory  Objective: Vital Signs: There were no vitals taken for this visit.  Physical Exam:  Low back: Well-healed midline surgical scar.  No tenderness in the upper lumbar spine.  She is moderately tender over the L5-S1 area and near both SI joints.  No pain in the  sciatic notch.  It appears that she has a leg length discrepancy with left leg shorter than the right.  Lower extremity strength and reflexes are normal.  Imaging: None today.  Assessment & Plan: 1.  Chronic low back pain probably due to facet arthropathy, sacroiliac dysfunction and foraminal stenosis. -We will try a custom brace at [employer]. -Suggested a trial of gluten-free diet for a few months, stopping simvastatin for a few months, and taking vitamin D3 at 5000 IU daily.  Suggested turmeric 500 mg twice daily.  Suggested minimizing intake of sugar.  Hopefully these things will help her pain and possibly help with neuropathy symptoms as well. -I will see her back as needed.     Procedures: No procedures performed  No notes on file     PMFS History: Patient Active Problem List   Diagnosis Date Noted  . Mild cognitive impairment 10/03/2017  . Gait abnormality 06/29/2016  . Chronic bilateral low back pain without sciatica 06/29/2016  . Memory loss 2015/10/29  . Death of husband 04-25-2014  . Bereavement due to life event 04/25/2014  . Absolute anemia 04/08/2014  . Atrial flutter ([place]) 02/23/2014  . Acute calculous cholecystitis 02/21/2014  . Spinal stenosis of  lumbar region 01/04/2014  . Recent bereavement 10/25/2013  . Arthritis 10/25/2013  . Hyperlipidemia 10/23/2013  . Hereditary and idiopathic peripheral neuropathy   . Nausea alone 07/14/2012  . Neuropathy, peripheral 05/05/2011  . High risk medication use 05/05/2011  . Subcutaneous emphysema ([place]) 05/05/2011  . Acute respiratory infection 04/03/2011  . Cough 04/03/2011  . Neuropathy 04/03/2011  . Osteoarthritis 05/15/2010  . STIFFNESS OF JOINT NEC ANKLE AND FOOT 05/15/2010  . GANGLION CYST, WRIST, RIGHT 05/15/2010  . VERTIGO, BENIGN PAROXYSMAL POSITION 01/31/2007   Past Medical History:  Diagnosis Date  . Arthritis   . Cataract 2007   bilateral cat ext   . Cholecystitis   . Complication of anesthesia Feb 21, 2014   atrial fib after cholecystectomy, lasted a few hours, none since  . Dysrhythmia 02-21-2014   after cholecystectomy had atrial fib for a few hours, none since  . GERD (gastroesophageal reflux disease)    Had endoscopy no history of Barrett's  . Hx of nonmelanoma skin cancer   . Unspecified hereditary and idiopathic peripheral neuropathy     Family History  Problem Relation Age of Onset  . Neurodegenerative disease Mother        Mikey Bussing Drager died at 3  . Alcohol abuse Other   . Heart disease Other     Past Surgical History:  Procedure Laterality Date  . ABDOMINAL HYSTERECTOMY  15 yrs ago   complete  . BREAST CYST ASPIRATION    . CHOLECYSTECTOMY N/A 02/21/2014   Procedure: LAPAROSCOPIC CHOLECYSTECTOMY WITH INTRAOPERATIVE CHOLANGIOGRAM;  Surgeon: Doreen Salvage, MD;  Location: [facility];  Service: General;  Laterality: N/A;  . ENDOSCOPIC RETROGRADE CHOLANGIOPANCREATOGRAPHY (ERCP) WITH PROPOFOL N/A 05/29/2014   Procedure: ENDOSCOPIC RETROGRADE CHOLANGIOPANCREATOGRAPHY (ERCP) WITH PROPOFOL;  Surgeon: Arta Silence, MD;  [facility];  Service: Endoscopy;  Laterality: N/A;  . ERCP N/A 02/24/2014   Procedure: ENDOSCOPIC RETROGRADE CHOLANGIOPANCREATOGRAPHY (ERCP);  Surgeon: Arta Silence, MD;  [facility];  Service: Endoscopy;  Laterality: N/A;  . EYE SURGERY Bilateral 2007   both eyes cataracts with lens replacments  . LUMBAR LAMINECTOMY/DECOMPRESSION MICRODISCECTOMY  02/01/2012   Procedure: LUMBAR LAMINECTOMY/DECOMPRESSION MICRODISCECTOMY 2 LEVELS;  Surgeon: Kristeen Miss, MD;  Location: [facility];  Service: Neurosurgery;  Laterality: Left;  Left Lumbar Four-Five Lumbar Five-Sacral One Laminectomies/Foraminotomies/Microscope  . SPYGLASS CHOLANGIOSCOPY N/A 05/29/2014   Procedure: SPYGLASS CHOLANGIOSCOPY;  Surgeon: Arta Silence, MD;  [facility];  Service: Endoscopy;  Laterality: N/A;  . TONSILLECTOMY  age 53   Social History   Occupational History  .  Occupation: Retired    Comment: Therapist, sports  Tobacco Use  . Smoking status: Former Smoker    Packs/day: 0.25    Years: 7.00    Pack years: 1.75    Types: Cigarettes    Last attempt to quit: 04/27/1963    Years since quitting: 55.2  . Smokeless tobacco: Never Used  Substance and Sexual Activity  . Alcohol use: Yes    Alcohol/week: 7.0 standard drinks    Types: 7 Glasses of wine per week    Comment: 1 glass of wine daily  . Drug use: No    Types: Hydrocodone  . Sexual activity: Yes

## 2018-08-07 ENCOUNTER — Ambulatory Visit: Payer: Medicare Other

## 2018-08-10 ENCOUNTER — Ambulatory Visit: Payer: Medicare Other

## 2018-09-08 ENCOUNTER — Telehealth: Payer: Self-pay | Admitting: *Deleted

## 2018-09-08 NOTE — Telephone Encounter (Signed)
Copied from [place] [id number]. Topic: General - Other >> Sep 08, 2018  9:40 AM Antonieta Iba C wrote: Reason for CRM: pt called in to make provider aware. Pt says that she was advised by pharmacy that a PA is needed for estradiol (CLIMARA - DOSED IN MG/24 HR) 0.025 mg/24hr patch    Please assist.

## 2018-09-11 ENCOUNTER — Telehealth: Payer: Self-pay | Admitting: Internal Medicine

## 2018-09-11 NOTE — Telephone Encounter (Signed)
PA completed and faxed to the insurance company---  KEY:  [id number]  Waiting for response from the insurance company.

## 2018-09-12 NOTE — Telephone Encounter (Signed)
PA has been done and is approved from 09/11/2018 through 09/11/2019

## 2018-09-13 NOTE — Telephone Encounter (Signed)
Patient calling for refill on estradiol (CLIMARA - DOSED IN MG/24 HR) 0.025 mg/24hr patch now that PA is approved. Please notify her once refill is sent.

## 2018-09-15 ENCOUNTER — Telehealth: Payer: Self-pay | Admitting: *Deleted

## 2018-09-15 NOTE — Telephone Encounter (Signed)
Copied from [place] [phone]. Topic: General - Other >> Sep 15, 2018  1:07 PM Keene Breath wrote: Reason for CRM: Patient called to speak with the nurse regarding some questions she has.  Please advise and call patient back at [phone]

## 2018-09-15 NOTE — Telephone Encounter (Signed)
Tried to call pt back to ask what her questions were but got disconnected from pt

## 2018-09-15 NOTE — Telephone Encounter (Signed)
Pt states she was calling about her medication but everything was handled

## 2018-09-21 ENCOUNTER — Other Ambulatory Visit: Payer: Self-pay

## 2018-09-21 MED ORDER — ESTRADIOL 0.025 MG/24HR TD PTWK: MEDICATED_PATCH | TRANSDERMAL | 2 refills | Status: DC

## 2018-09-21 NOTE — Telephone Encounter (Signed)
Medication was sent in   

## 2018-09-26 DIAGNOSIS — M5126 Other intervertebral disc displacement, lumbar region: Secondary | ICD-10-CM | POA: Diagnosis not present

## 2018-09-26 DIAGNOSIS — M5416 Radiculopathy, lumbar region: Secondary | ICD-10-CM | POA: Diagnosis not present

## 2018-09-26 DIAGNOSIS — M533 Sacrococcygeal disorders, not elsewhere classified: Secondary | ICD-10-CM | POA: Diagnosis not present

## 2018-09-26 DIAGNOSIS — M47816 Spondylosis without myelopathy or radiculopathy, lumbar region: Secondary | ICD-10-CM | POA: Diagnosis not present

## 2018-09-26 DIAGNOSIS — M5136 Other intervertebral disc degeneration, lumbar region: Secondary | ICD-10-CM | POA: Diagnosis not present

## 2018-09-26 DIAGNOSIS — M461 Sacroiliitis, not elsewhere classified: Secondary | ICD-10-CM | POA: Diagnosis not present

## 2018-09-26 DIAGNOSIS — M961 Postlaminectomy syndrome, not elsewhere classified: Secondary | ICD-10-CM | POA: Diagnosis not present

## 2018-10-05 ENCOUNTER — Telehealth: Payer: Self-pay

## 2018-10-05 NOTE — Telephone Encounter (Signed)
Form in red folder to fill out about estradiol medication

## 2018-10-11 ENCOUNTER — Other Ambulatory Visit: Payer: Self-pay

## 2018-10-11 ENCOUNTER — Ambulatory Visit
Admission: RE | Admit: 2018-10-11 | Discharge: 2018-10-11 | Disposition: A | Discharge status: Home / Self Care | Payer: Medicare Other | Source: Ambulatory Visit | Attending: Internal Medicine | Admitting: Internal Medicine

## 2018-10-11 DIAGNOSIS — Z1231 Encounter for screening mammogram for malignant neoplasm of breast: Secondary | ICD-10-CM | POA: Diagnosis not present

## 2018-10-12 ENCOUNTER — Other Ambulatory Visit: Payer: Self-pay | Admitting: Internal Medicine

## 2018-10-12 DIAGNOSIS — R928 Other abnormal and inconclusive findings on diagnostic imaging of breast: Secondary | ICD-10-CM

## 2018-10-18 ENCOUNTER — Ambulatory Visit
Admission: RE | Admit: 2018-10-18 | Discharge: 2018-10-18 | Disposition: A | Discharge status: Home / Self Care | Payer: Medicare Other | Source: Ambulatory Visit | Attending: Internal Medicine | Admitting: Internal Medicine

## 2018-10-18 ENCOUNTER — Other Ambulatory Visit: Payer: Self-pay

## 2018-10-18 DIAGNOSIS — N6002 Solitary cyst of left breast: Secondary | ICD-10-CM | POA: Diagnosis not present

## 2018-10-18 DIAGNOSIS — R928 Other abnormal and inconclusive findings on diagnostic imaging of breast: Secondary | ICD-10-CM

## 2018-10-18 DIAGNOSIS — R922 Inconclusive mammogram: Secondary | ICD-10-CM | POA: Diagnosis not present

## 2018-10-20 NOTE — Telephone Encounter (Signed)
Checked the status of PA. PA is still being processed.

## 2018-11-02 DIAGNOSIS — J3089 Other allergic rhinitis: Secondary | ICD-10-CM | POA: Diagnosis not present

## 2018-11-02 DIAGNOSIS — L299 Pruritus, unspecified: Secondary | ICD-10-CM | POA: Diagnosis not present

## 2018-11-03 DIAGNOSIS — L299 Pruritus, unspecified: Secondary | ICD-10-CM | POA: Diagnosis not present

## 2018-11-03 LAB — CBC AND DIFFERENTIAL
HCT: 39 (ref 36–46)
Hemoglobin: 13 (ref 12.0–16.0)
Platelets: 253 (ref 150–399)
WBC: 9.4

## 2018-11-12 NOTE — Progress Notes (Addendum)
PATIENT: Susan Ford DOB: August 17, 1929  REASON FOR VISIT: follow up HISTORY FROM: patient  HISTORY OF PRESENT ILLNESS: Today 11/13/18  HISTORY  Susan Ford is a 85 year-old right-handed white female,She was a patient of Dr. Erling Cruz, last visit was May 2014   She had a history of lumbar decompression surgery, presenting with chronic low back pain, mild gait difficulty due to pain.  MRI of the lumbar spine 12/25/2010 shows diffuse spondylitic changes and progression of degenerative changes at L3-4 and L4-5 and modest broad disc bulge at L5-S1. There is evidence of neuroforaminal stenosis on the left at L4-5. She has pain in her feet and aching from her knees down.  EMG/NCV 12/24/2011 showing progression of peripheral neuropathy versus a prior study 09/13/2011. There was also a mild overlying acute L5 radiculopathy on the left. She underwent surgical decompression at L4-5 and L5-S1 with bilateral foraminotomies and microdiscectomy on the left at L4-5 02/01/2012 by Dr. Ellene Route, Surgery helped her. She still complains of pain at low back pain, waist line, going down her leg, at both leg.  She denies bowel/bladder incontinence  She has difficulty walking, because of low back pain, worst pain in the morning, she usually take her hydrocodone early morning, then napping for a while, then she can function again.  Previously she has tried Lyrica which caused abdominal cramps, Cymbalta, caused nausea. She had side effects from nortriptyline 20 mg per night. She is on gabapentin 600, 1-1/2 tablets 4 times per day and complains of memory loss on the medication and now questions any improvement in pain. She uses a cane, but not in her house.   She underwent an epidural steroid injection 10/2011 with improvement in her back pain.   She is independent in activities of daily living including driving a car, cooking,cleaning, shopping, and doing laundry.   UPDATE April 20th 2016:Susan Ford  Last epidural injection was in 2015, she still has a lot of low back pain, she was put on nortriptyline 25 mg qhs, neurontin 634m qid, hydrocodone prn, celebrex 2038mdaily.  We have reviewed MRI lumbar in Feb 2016: Multilevel degenerative disc disease, with mild spinal stenosis, facet arthropathy, mild to moderate foraminal stenosis at different level She complains of constant low back pian, bilatera legs achy pain, triggered by standing for just 10 minutes, she has to sit down resting after standing or walking for short period of time  She is taking Gabapentin 600 mg 4 times a day, Celexa 200 mg every day, recently started nortriptyline 25 mg every night hydrocodone as needed previously tried Cymbalta, without helping her symptoms, untolerable side effect  UPDATE Jan 14 2015: Susan Ford She is alone at visit, she still takes celebrex 20016mid, Neurontin 600 mg 4 times a day, she could stand for 10 mintes, then has to sit down because of her low back pain, she also complains of intermittent bilateral lower extremity deep achy pain, She rarely needs narcotics now, denies bowel and bladder incontinence, no bilateral lower extremity numbness  UPDATE Sept 6th 2017:Susan Ford Last clinical visit was with CarHoyle Sauer June 2017, she is concerned about her memory loss, Laboratory evaluation in June 2017 showed normal TSH, B12, ESR, negative RPR, mild elevated glucose 109 on BMP, I have personally reviewed MRI of the brain in July 2017, moderate generalized atrophy, mild supratentorium small vessel disease,  She lives alone, still drives, but reported she got lost sometimes, today's Mini-Mental status examination 27 43t of 30 Greycliffrch 6  2018:Susan Ford She is alone at today's clinical visit, we have personally reviewed MRI of the brain, moderate generalized atrophy, small supratentorium small vessel disease, today she denies significant memory loss, lives alone at her house, still driving,  Her low back pain has  improved, stay at low back, denies radiating pain.  MRI of lumbar spine demonstrate multilevel degenerative changes, moderate bilateral foraminal stenosis at L5-S1, moderate stenosis at L 4-5, prior left L5 laminectomy  UPDATE Mar 22 2017:YYShe complains of worsening low back pain and bilateral foot, distal deep achy pain, She is now taking gabapentin 614m qid, celebrex 2064mqday, she sleeps well, worsening pain after bearingweight, UPDATE 6/10/2019CM SusanFord, [age]ear old female returns for follow-up with history of low back pain and mild cognitive impairment.  She is currently on Neurontin 600 mg 4 times daily and Celebrex daily.  She also goes to pain management for injections Dr. BaOrpah Melter She was started on Cymbalta at her last visit however she never got the medication filled.  Her back pain is fairly stable.  She continues to be independent with activities of daily living.  She continues to drive without difficulty.  She returns for reevaluation UPDATE 12/18/2019CM [name] old female returns for follow-up and a history of low back pain and mild cognitive impairment.  She remains on Neurontin 600 mg 4 times daily and Celebrex with fair control of her symptoms.  She also sees Dr. BaOrpah Melteror pain management injections.  Her back pain is fairly stable.  She continues to be independent with activities of daily living.  She continues to live alone.  She continues to drive without difficulty.  She has no new neurologic complaints.  She reports that her memory is stable.  She returns for reevaluation Update 11/13/2018 SS: Susan Ford a [age]ear old female with history of low back pain and mild cognitive impairment.  She remains on gabapentin 600 mg 4 times daily, and Celebrex.  She sees a doctor for pain management injections.  She says she is no longer getting the injections, was referred to a chiropractor.  She does not feel that the chiropractor is especially helpful, she is  thinking of stopping.  She continues to complain of some mild intermittent numbness in both feet.  She says she sleeps well at night, gabapentin is beneficial.  The sensation of numbness in her feet is no worse at any certain time during the day. She has not had any falls.  She continues to live alone, manages all of her ADLs, housework, finances, and drives a car without difficulty.  Her husband passed away 5 years ago, she is a retired [employer] scientist REVIEW OF SYSTEMS: Out of a complete 14 system review of symptoms, the patient complains only of the following symptoms, and all other reviewed systems are negative.  Memory disturbance, gait abnormality, numbness  ALLERGIES: Allergies  Allergen Reactions  . Keflex [Cephalexin]     Dizziness  . Lyrica [Pregabalin] Other (See Comments)    "High Fever"   . Oxytetracycline Other (See Comments)    "High Fever" caused by terramycin  . Memantine Rash    HOME MEDICATIONS: Outpatient Medications Prior to Visit  Medication Sig Dispense Refill  . acetaminophen (TYLENOL) 500 MG tablet Take 500 mg by mouth every 6 (six) hours as needed.    . Marland Kitchenscorbic acid (VITAMIN C) 1000 MG tablet Take 1,000 mg by mouth daily after lunch.     . Marland Kitchenspirin 81 MG tablet Take 81 mg by mouth daily after  lunch.     . calcium citrate-vitamin D (CITRACAL+D) 315-200 MG-UNIT per tablet Take 1 tablet by mouth daily.     . celecoxib (CELEBREX) 200 MG capsule Take 1 capsule (200 mg total) by mouth daily. 90 capsule 0  . estradiol (CLIMARA - DOSED IN MG/24 HR) 0.025 mg/24hr patch Place 1 patch on once a week on Saturday 8 patch 2  . famotidine (PEPCID) 20 MG tablet Take 20 mg by mouth daily as needed.     . lidocaine (LINDAMANTLE) 3 % CREA cream TOPICALLY 3 TIMES A DAY    . Multiple Vitamin (MULTIVITAMIN) tablet Take 1 tablet by mouth daily after lunch.     . simvastatin (ZOCOR) 10 MG tablet TAKE 1 TABLET BY MOUTH EVERYDAY AT BEDTIME 90 tablet 0  . gabapentin (NEURONTIN) 600 MG tablet  Take 1 tablet (600 mg total) by mouth 4 (four) times daily. 360 tablet 3   No facility-administered medications prior to visit.     PAST MEDICAL HISTORY: Past Medical History:  Diagnosis Date  . Arthritis   . Cataract 2007   bilateral cat ext   . Cholecystitis   . Complication of anesthesia Feb 21, 2014   atrial fib after cholecystectomy, lasted a few hours, none since  . Dysrhythmia 02-21-2014   after cholecystectomy had atrial fib for a few hours, none since  . GERD (gastroesophageal reflux disease)    Had endoscopy no history of Barrett's  . Hx of nonmelanoma skin cancer   . Unspecified hereditary and idiopathic peripheral neuropathy     PAST SURGICAL HISTORY: Past Surgical History:  Procedure Laterality Date  . ABDOMINAL HYSTERECTOMY  15 yrs ago   complete  . BREAST CYST ASPIRATION    . CHOLECYSTECTOMY N/A 02/21/2014   Procedure: LAPAROSCOPIC CHOLECYSTECTOMY WITH INTRAOPERATIVE CHOLANGIOGRAM;  Surgeon: Doreen Salvage, MD;  Location: [facility];  Service: General;  Laterality: N/A;  . ENDOSCOPIC RETROGRADE CHOLANGIOPANCREATOGRAPHY (ERCP) WITH PROPOFOL N/A 05/29/2014   Procedure: ENDOSCOPIC RETROGRADE CHOLANGIOPANCREATOGRAPHY (ERCP) WITH PROPOFOL;  Surgeon: Arta Silence, MD;  Location: [facility];  Service: Endoscopy;  Laterality: N/A;  . ERCP N/A 02/24/2014   Procedure: ENDOSCOPIC RETROGRADE CHOLANGIOPANCREATOGRAPHY (ERCP);  Surgeon: Arta Silence, MD;  Location[facility] END[facility]COPY;  Service: Endoscopy;  Laterality: N/A;  . EYE SURGERY Bilateral 2007   both eyes cataracts with lens replacments  . LUMBAR LAMINECTOMY/DECOMPRESSION MICRODISCECTOMY  02/01/2012   Procedure: LUMBAR LAMINECTOMY/DECOMPRESSION MICRODISCECTOMY 2 LEVELS;  Surgeon: Kristeen Miss, MD;  Location: [facility];  Service: Neurosurgery;  Laterality: Left;  Left Lumbar Four-Five Lumbar Five-Sacral One Laminectomies/Foraminotomies/Microscope  . SPYGLASS CHOLANGIOSCOPY N/A 05/29/2014   Procedure: SPYGLASS CHOLANGIOSCOPY;   Surgeon: Arta Silence, MD;  Location: [facility] END[facility];  Service: Endoscopy;  Laterality: N/A;  . TONSILLECTOMY  age 89    FAMILY HISTORY: Family History  Problem Relation Age of Onset  . Neurodegenerative disease Mother        Mikey Bussing Drager died at 50  . Alcohol abuse Other   . Heart disease Other     SOCIAL HISTORY: Social History   Socioeconomic History  . Marital status: Widowed    Spouse name: Gwyndolyn Saxon  . Number of children: 3  . Years of education: Not on file  . Highest education level: Not on file  Occupational History  . Occupation: Retired    Comment: Animal nutritionist  . Financial resource strain: Not very hard  . Food insecurity    Worry: Never true    Inability: Never true  . Transportation needs  Medical: No    Non-medical: No  Tobacco Use  . Smoking status: Former Smoker    Packs/day: 0.25    Years: 7.00    Pack years: 1.75    Types: Cigarettes    Quit date: 04/27/1963    Years since quitting: 55.5  . Smokeless tobacco: Never Used  Substance and Sexual Activity  . Alcohol use: Yes    Alcohol/week: 7.0 standard drinks    Types: 7 Glasses of wine per week    Comment: 1 glass of wine daily  . Drug use: No    Types: Hydrocodone  . Sexual activity: Yes  Lifestyle  . Physical activity    Days per week: 0 days    Minutes per session: 0 min  . Stress: Not at all  Relationships  . Social connections    Talks on phone: More than three times a week    Gets together: More than three times a week    Attends religious service: More than 4 times per year    Active member of club or organization: Yes    Attends meetings of clubs or organizations: More than 4 times per year    Relationship status: Widowed  . Intimate partner violence    Fear of current or ex partner: Not on file    Emotionally abused: Not on file    Physically abused: Not on file    Forced sexual activity: Not on file  Other Topics Concern  . 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 Misses working the floor.           PHYSICAL EXAM  Vitals:   11/13/18 1119  BP: 103/62  Pulse: 64  Temp: 98.5 F (36.9 C)  Weight: 123 lb (55.8 kg)  Height: '5\' 4"'  (1.626 m)   Body mass index is 21.11 kg/m.  Generalized: Well developed, in no acute distress  MMSE - Mini Mental State Exam 11/13/2018 04/12/2018 10/03/2017  Not completed: - (No Data) -  Orientation to time '4 4 5  ' Orientation to Place '5 5 4  ' Registration '3 3 3  ' Attention/ Calculation '4 4 5  ' Recall '3 2 1  ' Language- name 2 objects '2 2 2  ' Language- repeat '1 1 1  ' Language- follow 3 step command '3 3 3  ' Language- read & follow direction '1 1 1  ' Write a sentence '1 1 1  ' Copy design '1 1 1  ' Total score '28 27 27    ' Neurological examination  Mentation: Alert oriented to time, place, history taking. Follows all commands speech and language fluent Cranial nerve II-XII: Pupils were equal round reactive to light. Extraocular movements were full, visual field were full on confrontational test. Facial sensation and strength were normal. Uvula tongue midline. Head turning and shoulder shrug  were normal and symmetric. Motor: The motor testing reveals 4 over 5 strength of all 4 extremities. Good symmetric motor tone is noted throughout.  Sensory: Sensory testing is intact  to soft touch, pinprick on all 4 extremities, decreased vibration sensation in bilateral lower extremities up to mid shin. No evidence of extinction is noted.  Coordination: Cerebellar testing reveals good finger-nose-finger and heel-to-shin bilaterally.  Gait and station: Gait is mildly  unsteady, forward leaning, cautious, no assistive device, is able to rise from seated position without assistance.  Tandem gait is unsteady, able to walk on tiptoe/heels Reflexes: Deep tendon reflexes are symmetric and normal bilaterally.   DIAGNOSTIC DATA (LABS, IMAGING, TESTING) - I reviewed patient records, labs, notes, testing and imaging myself where available.  Lab Results  Component Value Date   WBC 7.6 07/05/2017   HGB 13.6 07/05/2017   HCT 40.0 07/05/2017   MCV 94.0 07/05/2017   PLT 249 07/05/2017      Component Value Date/Time   NA 136 03/13/2018 1140   NA 133 (A) 07/16/2016   K 4.5 03/13/2018 1140   CL 101 03/13/2018 1140   CO2 26 03/13/2018 1140   GLUCOSE 93 03/13/2018 1140   BUN 14 03/13/2018 1140   BUN 16 07/16/2016   CREATININE 0.61 03/13/2018 1140   CALCIUM 9.1 03/13/2018 1140   PROT 6.3 03/13/2018 1140   ALBUMIN 4.2 03/13/2018 1140   AST 17 03/13/2018 1140   ALT 9 03/13/2018 1140   ALKPHOS 47 03/13/2018 1140   BILITOT 0.5 03/13/2018 1140   GFRNONAA >60 07/05/2017 1813   GFRAA >60 07/05/2017 1813   Lab Results  Component Value Date   CHOL 162 03/13/2018   HDL 66.80 03/13/2018   LDLCALC 60 03/13/2018   LDLDIRECT 153 02/12/2009   TRIG 176.0 (H) 03/13/2018   CHOLHDL 2 03/13/2018   Lab Results  Component Value Date   HGBA1C 5.6 02/18/2016   Lab Results  Component Value Date   VITAMINB12 495 10/15/2015   Lab Results  Component Value Date   TSH 0.89 07/16/2016      ASSESSMENT AND PLAN 85 y.o. year old female  has a past medical history of Arthritis, Cataract (2007), Cholecystitis, Complication of anesthesia (Feb 21, 2014), Dysrhythmia (02-21-2014), GERD (gastroesophageal reflux disease), nonmelanoma skin cancer, and Unspecified hereditary and idiopathic peripheral neuropathy. here with:  1. Chronic low back pain, lumbar radiculopathy, mild gait abnormality, mild foot numbness  -sees Dr. Brien Few pain management specialist, was sent to  chiropractor, is not helping much   -prescribed Cymbalta in the past, but didn't start, could consider in the future if needed -She will continue Gabapentin 600 mg 4 times daily   2. Abnormal MRI of the brain, evidence of mild supratentorium small vessel disease 2017 - Continue aspirin 81 mg daily   3. Mild cognitive impairment -MMSE 28/30 -Could not tolerate Namenda in the past -Has not tried Aricept that she can recall, but doesn't want to trial at this time  -Encouraged her to continue activity, she remains very independent  She will follow-up in 8 months or sooner if needed.  I advised that if her symptoms worsen or she develops any new symptoms she should let us know.  I spent 15 minutes with the patient. 50% of this time was spent discussing her plan of care.    Butler Denmark, AGNP-C, DNP 11/13/2018, 12:05 PM [facility] [address] [place], [place] [address] [phone]

## 2018-11-13 ENCOUNTER — Encounter: Payer: Self-pay | Admitting: Neurology

## 2018-11-13 ENCOUNTER — Other Ambulatory Visit: Payer: Self-pay

## 2018-11-13 ENCOUNTER — Ambulatory Visit (INDEPENDENT_AMBULATORY_CARE_PROVIDER_SITE_OTHER): Payer: Medicare Other | Admitting: Neurology

## 2018-11-13 VITALS — BP 103/62 | HR 64 | Temp 98.5°F | Ht 64.0 in | Wt 123.0 lb

## 2018-11-13 DIAGNOSIS — R269 Unspecified abnormalities of gait and mobility: Secondary | ICD-10-CM

## 2018-11-13 DIAGNOSIS — G609 Hereditary and idiopathic neuropathy, unspecified: Secondary | ICD-10-CM | POA: Diagnosis not present

## 2018-11-13 DIAGNOSIS — G3184 Mild cognitive impairment, so stated: Secondary | ICD-10-CM | POA: Diagnosis not present

## 2018-11-13 MED ORDER — GABAPENTIN 600 MG PO TABS: 600.0000 mg | ORAL_TABLET | Freq: Four times a day (QID) | ORAL | 3 refills | Status: DC

## 2018-11-13 NOTE — Patient Instructions (Addendum)
It was wonderful to meet you! You look wonderful today! I will see you in 8 months! Please call if you need anything! Please continue aspirin daily, gabapentin 600 mg four times daily.

## 2018-11-13 NOTE — Progress Notes (Signed)
I have reviewed and agreed above plan. 

## 2018-12-05 DIAGNOSIS — D721 Eosinophilia: Secondary | ICD-10-CM | POA: Diagnosis not present

## 2018-12-12 ENCOUNTER — Other Ambulatory Visit: Payer: Self-pay | Admitting: Internal Medicine

## 2018-12-15 ENCOUNTER — Encounter: Payer: Self-pay | Admitting: Internal Medicine

## 2019-03-26 ENCOUNTER — Other Ambulatory Visit: Payer: Self-pay | Admitting: Internal Medicine

## 2019-05-05 ENCOUNTER — Ambulatory Visit: Payer: Medicare Other | Attending: Internal Medicine

## 2019-05-05 DIAGNOSIS — Z23 Encounter for immunization: Secondary | ICD-10-CM

## 2019-05-05 NOTE — Progress Notes (Signed)
   Covid-19 Vaccination Clinic  Name:  Susan Ford    MRN: [id number] DOB: 1930/04/01  05/05/2019  [name] was observed post Covid-19 immunization for 15 minutes without incidence. She was provided with Vaccine Information Sheet and instruction to access the V-Safe system.   [name] was instructed to call 911 with any severe reactions post vaccine: Marland Kitchen Difficulty breathing  . Swelling of your face and throat  . A fast heartbeat  . A bad rash all over your body  . Dizziness and weakness    Immunizations Administered    Name Date Dose VIS Date Route   Pfizer COVID-19 Vaccine 05/05/2019  1:01 PM 0.3 mL 04/06/2019 Intramuscular   Manufacturer: [employer], [employer]   Lot: [id number]   [place]: [id number]

## 2019-05-16 ENCOUNTER — Other Ambulatory Visit: Payer: Self-pay | Admitting: Internal Medicine

## 2019-05-16 NOTE — Telephone Encounter (Signed)
Message Routed to PCP CMA 

## 2019-05-16 NOTE — Telephone Encounter (Signed)
Patient is scheduled for 2/3. Patient inquired if a refill can be sent before scheduled appointment.

## 2019-05-16 NOTE — Addendum Note (Signed)
Addended by: Modena Morrow R on: 05/16/2019 03:27 PM   Modules accepted: Orders

## 2019-05-16 NOTE — Telephone Encounter (Signed)
Last ov :03/13/18 Last filled :09/21/2018 Upcoming :05/30/2019

## 2019-05-17 ENCOUNTER — Ambulatory Visit: Payer: Medicare Other

## 2019-05-18 ENCOUNTER — Ambulatory Visit (INDEPENDENT_AMBULATORY_CARE_PROVIDER_SITE_OTHER): Payer: Medicare Other

## 2019-05-18 ENCOUNTER — Other Ambulatory Visit: Payer: Self-pay

## 2019-05-18 ENCOUNTER — Other Ambulatory Visit: Payer: Self-pay | Admitting: Internal Medicine

## 2019-05-18 VITALS — Ht 64.0 in | Wt 125.0 lb

## 2019-05-18 DIAGNOSIS — Z0000 Encounter for general adult medical examination without abnormal findings: Secondary | ICD-10-CM

## 2019-05-18 MED ORDER — ESTRADIOL 0.025 MG/24HR TD PTWK: MEDICATED_PATCH | TRANSDERMAL | 2 refills | Status: DC

## 2019-05-18 NOTE — Progress Notes (Signed)
This visit is being conducted via phone call due to the COVID-19 pandemic. This patient has given me verbal consent via phone to conduct this visit, patient states they are participating from their home address. Some vital signs may be absent or patient reported.   Patient identification: identified by name, DOB, and current address.  Location provider: [facility], Office Persons participating in the virtual visit: Ms. Melannie Weed and Franne Forts, LPN.   Subjective:   Susan Ford is a 86 y.o. female who presents for Medicare Annual (Subsequent) preventive examination.  Review of Systems:   Cardiac Risk Factors include: sedentary lifestyle;dyslipidemia;advanced age (>78men, >58 women)    Objective:    Vitals: Ht 5\' 4"  (1.626 m)   Wt 125 lb (56.7 kg)   BMI 21.46 kg/m   Body mass index is 21.46 kg/m.  Advanced Directives 05/18/2019 05/12/2018 07/05/2017 05/11/2017 05/11/2017 05/29/2014 05/21/2014  Does Patient Have a Medical Advance Directive? Yes Yes - Yes Yes Yes Yes  Type of Advance Directive [place];Living will [place];Living will Living will - - [place];Living will [place];Living will  Does patient want to make changes to medical advance directive? No - Patient declined - - - - - -  Copy of [place] in Chart? No - copy requested No - copy requested - - - No - copy requested No - copy requested    Tobacco Social History   Tobacco Use  Smoking Status Former Smoker  . Packs/day: 0.25  . Years: 7.00  . Pack years: 1.75  . Types: Cigarettes  . Quit date: 04/27/1963  . Years since quitting: 56.0  Smokeless Tobacco Never Used     Counseling given: Not Answered   Clinical Intake:  Pre-visit preparation completed: Yes  Pain : No/denies pain     BMI - recorded: 21.46 Nutritional Status: BMI of 19-24  Normal Nutritional Risks: None Diabetes: No  How often  do you need to have someone help you when you read instructions, pamphlets, or other written materials from your doctor or pharmacy?: 1 - Never What is the last grade level you completed in school?: 3 years college  Interpreter Needed?: No  Information entered by :: Franne Forts, LPN.  Past Medical History:  Diagnosis Date  . Arthritis   . Cataract 2007   bilateral cat ext   . Cholecystitis   . Complication of anesthesia Feb 21, 2014   atrial fib after cholecystectomy, lasted a few hours, none since  . Dysrhythmia 02-21-2014   after cholecystectomy had atrial fib for a few hours, none since  . GERD (gastroesophageal reflux disease)    Had endoscopy no history of Barrett's  . Hx of nonmelanoma skin cancer   . Unspecified hereditary and idiopathic peripheral neuropathy    Past Surgical History:  Procedure Laterality Date  . ABDOMINAL HYSTERECTOMY  15 yrs ago   complete  . BREAST CYST ASPIRATION    . CHOLECYSTECTOMY N/A 02/21/2014   Procedure: LAPAROSCOPIC CHOLECYSTECTOMY WITH INTRAOPERATIVE CHOLANGIOGRAM;  Surgeon: Doreen Salvage, MD;  Location: [facility];  Service: General;  Laterality: N/A;  . ENDOSCOPIC RETROGRADE CHOLANGIOPANCREATOGRAPHY (ERCP) WITH PROPOFOL N/A 05/29/2014   Procedure: ENDOSCOPIC RETROGRADE CHOLANGIOPANCREATOGRAPHY (ERCP) WITH PROPOFOL;  Surgeon: Arta Silence, MD;  Location: [facility] END[facility]COPY;  Service: Endoscopy;  Laterality: N/A;  . ERCP N/A 02/24/2014   Procedure: ENDOSCOPIC RETROGRADE CHOLANGIOPANCREATOGRAPHY (ERCP);  Surgeon: Arta Silence, MD;  [facility] ENDOSCOPY;  Service: Endoscopy;  Laterality:  N/A;  . EYE SURGERY Bilateral 2007   both eyes cataracts with lens replacments  . LUMBAR LAMINECTOMY/DECOMPRESSION MICRODISCECTOMY  02/01/2012   Procedure: LUMBAR LAMINECTOMY/DECOMPRESSION MICRODISCECTOMY 2 LEVELS;  Surgeon: Kristeen Miss, MD;  Location: [facility]URO [facility];  Service: Neurosurgery;  Laterality: Left;  Left Lumbar Four-Five Lumbar Five-Sacral One  Laminectomies/Foraminotomies/Microscope  . SPYGLASS CHOLANGIOSCOPY N/A 05/29/2014   Procedure: SPYGLASS CHOLANGIOSCOPY;  Surgeon: Arta Silence, MD;  Location: [facility] ENDOSCOPY;  Service: Endoscopy;  Laterality: N/A;  . TONSILLECTOMY  age 85   Family History  Problem Relation Age of Onset  . Neurodegenerative disease Mother        Mikey Bussing Drager died at 87  . Alcohol abuse Other   . Heart disease Other    Social History   Socioeconomic History  . Marital status: Widowed    Spouse name: Not on file  . Number of children: 3  . Years of education: 15 years  . Highest education level: Associate degree: occupational, Hotel manager, or vocational program  Occupational History  . Occupation: Retired    Comment: Therapist, sports  Tobacco Use  . Smoking status: Former Smoker    Packs/day: 0.25    Years: 7.00    Pack years: 1.75    Types: Cigarettes    Quit date: 04/27/1963    Years since quitting: 56.0  . Smokeless tobacco: Never Used  Substance and Sexual Activity  . Alcohol use: Yes    Alcohol/week: 7.0 standard drinks    Types: 7 Glasses of wine per week    Comment: 1 glass of wine daily  . Drug use: No    Types: Hydrocodone  . Sexual activity: Yes  Other Topics Concern  . 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 Misses working the floor.          Social Determinants of Health   Financial Resource Strain: Low Risk   . Difficulty of Paying Living Expenses: Not hard at all  Food Insecurity: No Food Insecurity  . Worried About Charity fundraiser in the Last Year: Never true  . Ran Out of Food in the Last Year: Never true    Transportation Needs: No Transportation Needs  . Lack of Transportation (Medical): No  . Lack of Transportation (Non-Medical): No  Physical Activity: Inactive  . Days of Exercise per Week: 0 days  . Minutes of Exercise per Session: 0 min  Stress: No Stress Concern Present  . Feeling of Stress : Only a little  Social Connections: Slightly Isolated  . Frequency of Communication with Friends and Family: More than three times a week  . Frequency of Social Gatherings with Friends and Family: Once a week  . Attends Religious Services: More than 4 times per year  . Active Member of Clubs or Organizations: Yes  . Attends Archivist Meetings: More than 4 times per year  . Marital Status: Widowed    Outpatient Encounter Medications as of 05/18/2019  Medication Sig  . acetaminophen (TYLENOL)  500 MG tablet Take 500 mg by mouth every 6 (six) hours as needed.  Marland Kitchen ascorbic acid (VITAMIN C) 1000 MG tablet Take 1,000 mg by mouth daily after lunch.   Marland Kitchen aspirin 81 MG tablet Take 81 mg by mouth daily after lunch.   . calcium citrate-vitamin D (CITRACAL+D) 315-200 MG-UNIT per tablet Take 1 tablet by mouth daily.   . celecoxib (CELEBREX) 200 MG capsule Take 1 capsule (200 mg total) by mouth daily.  Marland Kitchen estradiol (CLIMARA - DOSED IN MG/24 HR) 0.025 mg/24hr patch Place 1 patch on once a week on Saturday  . famotidine (PEPCID) 20 MG tablet Take 20 mg by mouth daily as needed.   . gabapentin (NEURONTIN) 600 MG tablet Take 1 tablet (600 mg total) by mouth 4 (four) times daily.  Marland Kitchen lidocaine (LINDAMANTLE) 3 % CREA cream TOPICALLY 3 TIMES A DAY  . Multiple Vitamin (MULTIVITAMIN) tablet Take 1 tablet by mouth daily after lunch.   . simvastatin (ZOCOR) 10 MG tablet TAKE 1 TABLET BY MOUTH EVERYDAY AT BEDTIME   No facility-administered encounter medications on file as of 05/18/2019.    Activities of Daily Living In your present state of health, do you have any difficulty performing the following activities:  05/18/2019  Hearing? N  Vision? N  Difficulty concentrating or making decisions? N  Walking or climbing stairs? N  Dressing or bathing? N  Doing errands, shopping? N  Preparing Food and eating ? N  Using the Toilet? N  In the past six months, have you accidently leaked urine? Y  Do you have problems with loss of bowel control? N  Managing your Medications? N  Managing your Finances? N  Housekeeping or managing your Housekeeping? N  Some recent data might be hidden    Patient Care Team: Panosh, Standley Brooking, MD as PCP - General [place], Amy, MD as Consulting Physician (Dermatology) Marcial Pacas, MD as Consulting Physician (Neurology) Kristeen Miss, MD as Consulting Physician (Neurosurgery) Marlaine Hind, MD as Consulting Physician (Physical Medicine and Rehabilitation) Jola Baptist, [place] as Referring Physician (Chiropractic Medicine) Webb Laws, [place] as Referring Physician (Optometry) Jola Baptist, [place] as Referring Physician (Chiropractic Medicine)    Assessment:   This is a routine wellness examination for [place].  Exercise Activities and Dietary recommendations Current Exercise Habits: The patient does not participate in regular exercise at present, Exercise limited by: orthopedic condition(s)  Goals    . DIET - EAT MORE FRUITS AND VEGETABLES    . Patient Stated     To maintain your health     . Patient Stated     Stay engaged with others;        Fall Risk Fall Risk  05/18/2019 05/12/2018 03/13/2018 05/11/2017 02/18/2016  Falls in the past year? 0 0 0 No No  Risk for fall due to : Medication side effect Impaired vision;Impaired balance/gait - - -  Follow up Falls evaluation completed;Education provided;Falls prevention discussed Education provided;Falls prevention discussed - - -   Is the patient's home free of loose throw rugs in walkways, pet beds, electrical cords, etc?   yes      Grab bars in the bathroom? yes      Handrails on the stairs?   yes      Adequate  lighting?   yes  Timed Get Up and Go performed: N/A due to telephone visit  Depression Screen PHQ 2/9 Scores 05/18/2019 05/12/2018 03/13/2018 05/11/2017  PHQ - 2 Score 0 0 0 0  PHQ- 9 Score -  0 - -     Cognitive Function MMSE - Mini Mental State Exam 11/13/2018 04/12/2018 10/03/2017 05/11/2017 06/29/2016  Not completed: - (No Data) - (No Data) -  Orientation to time 4 4 5  - 5  Orientation to Place 5 5 4  - 5  Registration 3 3 3  - 3  Attention/ Calculation 4 4 5  - 5  Recall 3 2 1  - 3  Language- name 2 objects 2 2 2  - 2  Language- repeat 1 1 1  - 1  Language- follow 3 step command 3 3 3  - 3  Language- read & follow direction 1 1 1  - 1  Write a sentence 1 1 1  - 1  Copy design 1 1 1  - 1  Total score 28 27 27  - 30     6CIT Screen 05/18/2019  What Year? 0 points  What month? 0 points  What time? 0 points  Count back from 20 0 points  Months in reverse 0 points  Repeat phrase 2 points  Total Score 2    Immunization History  Administered Date(s) Administered  . Influenza Split 02/25/2012  . Influenza, High Dose Seasonal PF 01/13/2016, 01/19/2017, 01/04/2018  . Influenza-Unspecified 01/31/2014  . PFIZER SARS-COV-2 Vaccination 05/05/2019  . Pneumococcal Conjugate-13 10/23/2013, 03/11/2014  . Pneumococcal Polysaccharide-23 02/18/2016  . Td 05/15/2010  . Tdap 11/08/2011  . Zoster 04/09/2014    Qualifies for Shingles Vaccine?Yes  Screening Tests Health Maintenance  Topic Date Due  . INFLUENZA VACCINE  11/25/2018  . TETANUS/TDAP  11/07/2021  . DEXA SCAN  Completed  . PNA vac Low Risk Adult  Completed    Cancer Screenings: Lung: Low Dose CT Chest recommended if Age 32-80 years, 30 pack-year currently smoking OR have quit w/in 15years. Patient does not qualify. Breast:  Up to date on Mammogram? Yes   Up to date of Bone Density/Dexa? No; declines further testing due to advanced age Colorectal: Not necessary due to advanced age  Additional Screenings:  Hepatitis C Screening:  N/A due to age     Plan:   Ms. Thielen declines further colonoscopies and DEXA scans due to advanced age. She understands to check with pharmacy for out of pocket cost for shingrix vaccines. She reports that she has received the first covid vaccine and will complete the series.    I have personally reviewed and noted the following in the patient's chart:   . Medical and social history . Use of alcohol, tobacco or illicit drugs  . Current medications and supplements . Functional ability and status . Nutritional status . Physical activity . Advanced directives . List of other physicians . Hospitalizations, surgeries, and ER visits in previous 12 months . Vitals . Screenings to include cognitive, depression, and falls . Referrals and appointments  In addition, I have reviewed and discussed with patient certain preventive protocols, quality metrics, and best practice recommendations. A written personalized care plan for preventive services as well as general preventive health recommendations were provided to patient.     Franne Forts, LPN  [phone]

## 2019-05-18 NOTE — Telephone Encounter (Signed)
Pt requesting refill states she is no longer going to obgyn.  Upcoming visit :05/30/2019 Last ov:03/13/18 Last filled :09/21/2018

## 2019-05-18 NOTE — Patient Instructions (Addendum)
Susan Ford , Thank you for taking time to participate in your Medicare Wellness Visit. I appreciate your ongoing commitment to your health goals. Please review the following plan we discussed and let me know if I can assist you in the future.   Screening recommendations/referrals: Colorectal Screening: aged out Mammogram: completed 10/18/2018 Bone Density: DEXA completed 02/10/2015 and declines any additional.  Vision and Dental Exams: Recommended annual ophthalmology exams for early detection of glaucoma and other disorders of the eye Recommended annual dental exams for proper oral hygiene. Patient is behind with these appointments due to the covid pandemic.  Diabetic Exams: Diabetic Eye Exam: N/A Diabetic Foot Exam: N/A  Vaccinations: Influenza vaccine: completed in Sept 2020 per patient report. Pneumococcal vaccine: completed 10/23/2013 and 02/18/2016. Up to date.  Tdap vaccine: completed 11/08/2011; due again 11/07/2021. Shingles vaccine: Please contact your pharmacy to determine your out of pocket expense for the Shingrix vaccine. You may receive this vaccine at your local pharmacy.  Advanced directives: Advance directives discussed with you today. Please bring a copy of your POA (Power of [place]) and/or Living Will to your next appointment.  Goals: Recommend to drink at least 6-8 8oz glasses of water per day.  Recommend to remove any items from the home that may cause slips or trips.  Next appointment: Please schedule your Annual Wellness Visit with your Nurse Health Advisor in one year.  Preventive Care 77 Years and Older, Female Preventive care refers to lifestyle choices and visits with your health care provider that can promote health and wellness. What does preventive care include?  A yearly physical exam. This is also called an annual well check.  Dental exams once or twice a year.  Routine eye exams. Ask your health care provider how often you should have your  eyes checked.  Personal lifestyle choices, including:  Daily care of your teeth and gums.  Regular physical activity.  Eating a healthy diet.  Avoiding tobacco and drug use.  Limiting alcohol use.  Practicing safe sex.  Taking low-dose aspirin every day if recommended by your health care provider.  Taking vitamin and mineral supplements as recommended by your health care provider. What happens during an annual well check? The services and screenings done by your health care provider during your annual well check will depend on your age, overall health, lifestyle risk factors, and family history of disease. Counseling  Your health care provider may ask you questions about your:  Alcohol use.  Tobacco use.  Drug use.  Emotional well-being.  Home and relationship well-being.  Sexual activity.  Eating habits.  History of falls.  Memory and ability to understand (cognition).  Work and work Statistician.  Reproductive health. Screening  You may have the following tests or measurements:  Height, weight, and BMI.  Blood pressure.  Lipid and cholesterol levels. These may be checked every 5 years, or more frequently if you are over 44 years old.  Skin check.  Lung cancer screening. You may have this screening every year starting at age 66 if you have a 30-pack-year history of smoking and currently smoke or have quit within the past 15 years.  Fecal occult blood test (FOBT) of the stool. You may have this test every year starting at age 46.  Flexible sigmoidoscopy or colonoscopy. You may have a sigmoidoscopy every 5 years or a colonoscopy every 10 years starting at age 72.  Hepatitis C blood test.  Hepatitis B blood test.  Sexually transmitted disease (STD) testing.  Diabetes screening. This is done by checking your blood sugar (glucose) after you have not eaten for a while (fasting). You may have this done every 1-3 years.  Bone density scan. This is done to  screen for osteoporosis. You may have this done starting at age 35.  Mammogram. This may be done every 1-2 years. Talk to your health care provider about how often you should have regular mammograms. Talk with your health care provider about your test results, treatment options, and if necessary, the need for more tests. Vaccines  Your health care provider may recommend certain vaccines, such as:  Influenza vaccine. This is recommended every year.  Tetanus, diphtheria, and acellular pertussis (Tdap, Td) vaccine. You may need a Td booster every 10 years.  Zoster vaccine. You may need this after age 24.  Pneumococcal 13-valent conjugate (PCV13) vaccine. One dose is recommended after age 10.  Pneumococcal polysaccharide (PPSV23) vaccine. One dose is recommended after age 9. Talk to your health care provider about which screenings and vaccines you need and how often you need them. This information is not intended to replace advice given to you by your health care provider. Make sure you discuss any questions you have with your health care provider. Document Released: 05/09/2015 Document Revised: 12/31/2015 Document Reviewed: 02/11/2015 Elsevier Interactive Patient Education  2017 [place] Prevention in the Home Falls can cause injuries. They can happen to people of all ages. There are many things you can do to make your home safe and to help prevent falls. What can I do on the outside of my home?  Regularly fix the edges of walkways and driveways and fix any cracks.  Remove anything that might make you trip as you walk through a door, such as a raised step or threshold.  Trim any bushes or trees on the path to your home.  Use bright outdoor lighting.  Clear any walking paths of anything that might make someone trip, such as rocks or tools.  Regularly check to see if handrails are loose or broken. Make sure that both sides of any steps have handrails.  Any raised decks  and porches should have guardrails on the edges.  Have any leaves, snow, or ice cleared regularly.  Use sand or salt on walking paths during winter.  Clean up any spills in your garage right away. This includes oil or grease spills. What can I do in the bathroom?  Use night lights.  Install grab bars by the toilet and in the tub and shower. Do not use towel bars as grab bars.  Use non-skid mats or decals in the tub or shower.  If you need to sit down in the shower, use a plastic, non-slip stool.  Keep the floor dry. Clean up any water that spills on the floor as soon as it happens.  Remove soap buildup in the tub or shower regularly.  Attach bath mats securely with double-sided non-slip rug tape.  Do not have throw rugs and other things on the floor that can make you trip. What can I do in the bedroom?  Use night lights.  Make sure that you have a light by your bed that is easy to reach.  Do not use any sheets or blankets that are too big for your bed. They should not hang down onto the floor.  Have a firm chair that has side arms. You can use this for support while you get dressed.  Do not have throw  rugs and other things on the floor that can make you trip. What can I do in the kitchen?  Clean up any spills right away.  Avoid walking on wet floors.  Keep items that you use a lot in easy-to-reach places.  If you need to reach something above you, use a strong step stool that has a grab bar.  Keep electrical cords out of the way.  Do not use floor polish or wax that makes floors slippery. If you must use wax, use non-skid floor wax.  Do not have throw rugs and other things on the floor that can make you trip. What can I do with my stairs?  Do not leave any items on the stairs.  Make sure that there are handrails on both sides of the stairs and use them. Fix handrails that are broken or loose. Make sure that handrails are as long as the stairways.  Check any  carpeting to make sure that it is firmly attached to the stairs. Fix any carpet that is loose or worn.  Avoid having throw rugs at the top or bottom of the stairs. If you do have throw rugs, attach them to the floor with carpet tape.  Make sure that you have a light switch at the top of the stairs and the bottom of the stairs. If you do not have them, ask someone to add them for you. What else can I do to help prevent falls?  Wear shoes that:  Do not have high heels.  Have rubber bottoms.  Are comfortable and fit you well.  Are closed at the toe. Do not wear sandals.  If you use a stepladder:  Make sure that it is fully opened. Do not climb a closed stepladder.  Make sure that both sides of the stepladder are locked into place.  Ask someone to hold it for you, if possible.  Clearly mark and make sure that you can see:  Any grab bars or handrails.  First and last steps.  Where the edge of each step is.  Use tools that help you move around (mobility aids) if they are needed. These include:  Canes.  Walkers.  Scooters.  Crutches.  Turn on the lights when you go into a dark area. Replace any light bulbs as soon as they burn out.  Set up your furniture so you have a clear path. Avoid moving your furniture around.  If any of your floors are uneven, fix them.  If there are any pets around you, be aware of where they are.  Review your medicines with your doctor. Some medicines can make you feel dizzy. This can increase your chance of falling. Ask your doctor what other things that you can do to help prevent falls. This information is not intended to replace advice given to you by your health care provider. Make sure you discuss any questions you have with your health care provider. Document Released: 02/06/2009 Document Revised: 09/18/2015 Document Reviewed: 05/17/2014 Elsevier Interactive Patient Education  2017 [employer].

## 2019-05-25 ENCOUNTER — Ambulatory Visit: Payer: Medicare Other

## 2019-05-26 ENCOUNTER — Ambulatory Visit: Payer: Medicare Other

## 2019-05-26 ENCOUNTER — Ambulatory Visit: Payer: Medicare Other | Attending: Internal Medicine

## 2019-05-26 DIAGNOSIS — Z23 Encounter for immunization: Secondary | ICD-10-CM | POA: Insufficient documentation

## 2019-05-26 NOTE — Progress Notes (Signed)
   Covid-19 Vaccination Clinic  Name:  Susan Ford    MRN: [id number] DOB: February 15, 1930  05/26/2019  [name] was observed post Covid-19 immunization for 15 minutes without incidence. She was provided with Vaccine Information Sheet and instruction to access the V-Safe system.   [name] was instructed to call 911 with any severe reactions post vaccine: Marland Kitchen Difficulty breathing  . Swelling of your face and throat  . A fast heartbeat  . A bad rash all over your body  . Dizziness and weakness    Immunizations Administered    Name Date Dose VIS Date Route   Pfizer COVID-19 Vaccine 05/26/2019  1:52 PM 0.3 mL 04/06/2019 Intramuscular   Manufacturer: [employer]   Lot: [id number]   [place]: [id number]

## 2019-05-29 NOTE — Progress Notes (Signed)
Chief Complaint  Patient presents with  . Follow-up    Pt has no other concerns today    This visit occurred during the SARS-CoV-2 public health emergency.  Safety protocols were in place, including screening questions prior to the visit, additional usage of staff PPE, and extensive cleaning of exam room while observing appropriate contact time as indicated for disinfecting solutions.    HPI: Susan Ford 86 y.o. come in for Chronic  Medication /disease management   Has advance directive and living will  Lives alone widowed 5 years  Family in [place] driving distance and  [place] etc . Has social friends  She has completed bother doses of COVID vaccine a week ago   Sees neuro for [facility] and PN on gabapentin with help .  Thinks about the same. OA  And spinal stenosis Back pain ongoing  Using Celebrex  And tylenol    Back witll itch and trying not to scratch  Taking med from derm at night to help .  Uses some otc for the area and then has heating pad on couch when sitting doing her crosswords etc .  On low dose HRT for years and doenst want to stop  ROS: See pertinent positives and negatives per HPI. No fall  No change bowel bladder   Past Medical History:  Diagnosis Date  . Arthritis   . Cataract 2007   bilateral cat ext   . Cholecystitis   . Complication of anesthesia Feb 21, 2014   atrial fib after cholecystectomy, lasted a few hours, none since  . Dysrhythmia 02-21-2014   after cholecystectomy had atrial fib for a few hours, none since  . GERD (gastroesophageal reflux disease)    Had endoscopy no history of Barrett's  . Hx of nonmelanoma skin cancer   . Unspecified hereditary and idiopathic peripheral neuropathy     Family History  Problem Relation Age of Onset  . Neurodegenerative disease Mother        Mikey Bussing Drager died at 40  . Alcohol abuse Other   . Heart disease Other     Social History   Socioeconomic History  . Marital status: Widowed    Spouse name: Not  on file  . Number of children: 3  . Years of education: 15 years  . Highest education level: Associate degree: occupational, Hotel manager, or vocational program  Occupational History  . Occupation: Retired    Comment: Therapist, sports  Tobacco Use  . Smoking status: Former Smoker    Packs/day: 0.25    Years: 7.00    Pack years: 1.75    Types: Cigarettes    Quit date: 04/27/1963    Years since quitting: 56.1  . Smokeless tobacco: Never Used  Substance and Sexual Activity  . Alcohol use: Yes    Alcohol/week: 7.0 standard drinks    Types: 7 Glasses of wine per week    Comment: 1 glass of wine daily  . Drug use: No    Types: Hydrocodone  . Sexual activity: Yes  Other Topics Concern  . 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 Misses working the floor.          Social Determinants of Health   Financial Resource Strain: Low Risk   . Difficulty of Paying Living Expenses: Not hard at all  Food Insecurity: No Food Insecurity  . Worried About Charity fundraiser in the Last Year: Never true  . Ran Out of Food in the Last Year: Never true  Transportation Needs: No Transportation Needs  . Lack of Transportation (Medical): No  . Lack of Transportation (Non-Medical): No  Physical Activity: Inactive  . Days of Exercise per Week: 0 days  . Minutes of Exercise per Session: 0 min  Stress: No Stress Concern Present  . Feeling of Stress : Only a little  Social Connections: Slightly Isolated  . Frequency of Communication with Friends and Family: More than three times a week  . Frequency of Social Gatherings with Friends and Family:  Once a week  . Attends Religious Services: More than 4 times per year  . Active Member of Clubs or Organizations: Yes  . Attends Archivist Meetings: More than 4 times per year  . Marital Status: Widowed    Outpatient Medications Prior to Visit  Medication Sig Dispense Refill  . acetaminophen (TYLENOL) 500 MG tablet Take 500 mg by mouth every 6 (six) hours as needed.    Marland Kitchen ascorbic acid (VITAMIN C) 1000 MG tablet Take 1,000 mg by mouth daily after lunch.     Marland Kitchen aspirin 81 MG tablet Take 81 mg by mouth daily after lunch.     . calcium citrate-vitamin D (CITRACAL+D) 315-200 MG-UNIT per tablet Take 1 tablet by mouth daily.     . celecoxib (CELEBREX) 200 MG capsule Take 1 capsule (200 mg total) by mouth daily. 90 capsule 0  . estradiol (CLIMARA - DOSED IN MG/24 HR) 0.025 mg/24hr patch Place 1 patch on once a week on Saturday 8 patch 2  . famotidine (PEPCID) 20 MG tablet Take 20 mg by mouth daily as needed.     . gabapentin (NEURONTIN) 600 MG tablet Take 1 tablet (600 mg total) by mouth 4 (four) times daily. 360 tablet 3  . lidocaine (LINDAMANTLE) 3 % CREA cream TOPICALLY 3 TIMES A DAY    . Multiple Vitamin (MULTIVITAMIN) tablet Take 1 tablet by mouth daily after lunch.     . simvastatin (ZOCOR) 10 MG tablet TAKE 1 TABLET BY MOUTH EVERYDAY AT BEDTIME 90 tablet 0   No facility-administered medications prior to visit.     EXAM:  BP 118/64 (BP Location: Right Arm, Patient Position: Sitting, Cuff Size: Normal)   Pulse 95   Temp 98.1 F (36.7 C) (Temporal)   Wt 122 lb 12.8 oz (55.7 kg)   SpO2 97%   BMI 21.08 kg/m   Body mass index is 21.08 kg/m.  GENERAL: vitals reviewed and listed above, alert, oriented, appears well hydrated and in no acute distress independent gait  HEENT: atraumatic, conjunctiva  clear, no obvious abnormalities on inspection of external nose and ears OP : masked  NECK: no obvious masses on inspection palpation  LUNGS: clear to auscultation bilaterally, no  wheezes, rales or rhonchi, CV: HRRR, no clubbing cyanosis or  peripheral edema nl cap refill  Abdomen:  Sof,t normal bowel sounds without hepatosplenomegaly, no guarding rebound or masses no CVA tenderness MS: moves all extremities mild  mod djd changes  PSYCH: pleasant and cooperative, no obvious depression or anxiety    Pix shown to patient  For insight  Lab Results  Component Value Date   WBC 9.4 11/03/2018   HGB 13.0 11/03/2018   HCT 39 11/03/2018   PLT 253 11/03/2018   GLUCOSE 93 03/13/2018   CHOL 162 03/13/2018   TRIG 176.0 (H) 03/13/2018   HDL 66.80 03/13/2018   LDLDIRECT 153 02/12/2009   LDLCALC 60 03/13/2018   ALT 9 03/13/2018   AST 17 03/13/2018   NA 136 03/13/2018   K 4.5 03/13/2018   CL 101 03/13/2018   CREATININE 0.61 03/13/2018   BUN 14 03/13/2018   CO2 26 03/13/2018   TSH 0.89 07/16/2016   INR 0.91 07/05/2017   HGBA1C 5.6 02/18/2016   BP Readings from Last 3 Encounters:  05/30/19 118/64  11/13/18 103/62  05/12/18 108/64    ASSESSMENT AND PLAN:  Discussed the following assessment and plan:  Hyperlipidemia, unspecified hyperlipidemia type - Plan: CBC with Differential/Platelet, Comprehensive metabolic panel, Lipid panel, Lipid panel, Comprehensive metabolic panel, CBC with Differential/Platelet  Medication management - Plan: CBC with Differential/Platelet, Comprehensive metabolic panel, Lipid panel, Lipid panel, Comprehensive metabolic panel, CBC with Differential/Platelet  Dermatitis - back poss from topical for back pain and using heat pad low also dsic rx steroid topical   Mild cognitive impairment - Plan: CBC with Differential/Platelet, Comprehensive metabolic panel, Comprehensive metabolic panel, CBC with Differential/Platelet  Spinal stenosis of lumbar region, unspecified whether neurogenic claudication present - celebrex tylenol daily and some topicals  functional  adls   Hereditary and idiopathic peripheral neuropathy - Plan: CBC with  Differential/Platelet, Comprehensive metabolic panel, Comprehensive metabolic panel, CBC with Differential/Platelet  Current use of estrogen therapy  Eosinophilic leukocytosis, unspecified type - reportee by other provider  will check today  - Plan: CBC with Differential/Platelet, Comprehensive metabolic panel, Lipid panel, Eosinophil count, Eosinophil count, Lipid panel, Comprehensive metabolic panel, CBC with Differential/Platelet Past record review  Had incesos count  Has derm  She sees also  Awareness of topical irritants and try tmc  Also   Plan 6 mos renal check if stays on the celebrex.   Fraser Din wants to remain on the hrt low dose patch per week . Return in about 6 months (around 11/27/2019) for med check .   -Patient advised to return or notify health care team  if  new concerns arise.in interim  Patient Instructions  Avoid  Prolonged heat and  Cold to the skin  .   Try to stop scratching  As possible  You could be sensitive to the topical you use for the back pain.  Can use rx steroid  For 2 weeks to see if helps decrease the itching .  Will notify you  of labs when available. Then yearly check   Labs as  Needed .     Standley Brooking. Baily Hovanec M.D. ASSESSMENT AND PLAN  [birth year] 86 y.o. year old female  has a past medical history of Arthritis, Cataract (2007), Cholecystitis, Complication of anesthesia (Feb 21, 2014), Dysrhythmia (02-21-2014), GERD (gastroesophageal reflux disease), nonmelanoma skin cancer, and Unspecified hereditary and idiopathic peripheral neuropathy. here with:  1. Chronic low back pain, lumbar radiculopathy, mild gait abnormality, mild foot numbness  -sees Dr. Brien Few pain management specialist, was sent to chiropractor, is not helping much   -prescribed Cymbalta in the past, but didn't start, could consider in the future if needed -She will continue Gabapentin 600 mg 4 times daily   2. Abnormal  MRI of the brain, evidence of mild supratentorium small vessel disease  2017 - Continue aspirin 81 mg daily   3. Mild cognitive impairment -MMSE 28/30 -Could not tolerate Namenda in the past -Has not tried Aricept that she can recall, but doesn't want to trial at this time  -Encouraged her to continue activity, she remains very independent  She will follow-up in 8 months or sooner if needed.  I advised that if her symptoms worsen or she develops any new symptoms she should let us know.  I spent 15 minutes with the patient. 50% of this time was spent discussing her plan of care.    Butler Denmark, AGNP-C, DNP 11/13/2018, 12:05 PM [facility] [address], [place], [place] [address] [phone]

## 2019-05-30 ENCOUNTER — Other Ambulatory Visit: Payer: Self-pay

## 2019-05-30 ENCOUNTER — Ambulatory Visit (INDEPENDENT_AMBULATORY_CARE_PROVIDER_SITE_OTHER): Payer: Medicare Other | Admitting: Internal Medicine

## 2019-05-30 ENCOUNTER — Encounter: Payer: Self-pay | Admitting: Internal Medicine

## 2019-05-30 VITALS — BP 118/64 | HR 95 | Temp 98.1°F | Wt 122.8 lb

## 2019-05-30 DIAGNOSIS — E785 Hyperlipidemia, unspecified: Secondary | ICD-10-CM

## 2019-05-30 DIAGNOSIS — G609 Hereditary and idiopathic neuropathy, unspecified: Secondary | ICD-10-CM

## 2019-05-30 DIAGNOSIS — L309 Dermatitis, unspecified: Secondary | ICD-10-CM

## 2019-05-30 DIAGNOSIS — Z79899 Other long term (current) drug therapy: Secondary | ICD-10-CM | POA: Diagnosis not present

## 2019-05-30 DIAGNOSIS — M48061 Spinal stenosis, lumbar region without neurogenic claudication: Secondary | ICD-10-CM

## 2019-05-30 DIAGNOSIS — G3184 Mild cognitive impairment, so stated: Secondary | ICD-10-CM | POA: Diagnosis not present

## 2019-05-30 DIAGNOSIS — D7219 Other eosinophilia: Secondary | ICD-10-CM | POA: Diagnosis not present

## 2019-05-30 DIAGNOSIS — Z79818 Long term (current) use of other agents affecting estrogen receptors and estrogen levels: Secondary | ICD-10-CM

## 2019-05-30 LAB — LIPID PANEL
Cholesterol: 143 mg/dL (ref 0–200)
HDL: 62.2 mg/dL (ref >39.00)
LDL Cholesterol: 57 mg/dL (ref 0–99)
NonHDL: 80.61
Total CHOL/HDL Ratio: 2
Triglycerides: 117 mg/dL (ref 0.0–149.0)
VLDL: 23.4 mg/dL (ref 0.0–40.0)

## 2019-05-30 LAB — COMPREHENSIVE METABOLIC PANEL
ALT: 10 U/L (ref 0–35)
AST: 17 U/L (ref 0–37)
Albumin: 3.8 g/dL (ref 3.5–5.2)
Alkaline Phosphatase: 60 U/L (ref 39–117)
BUN: 13 mg/dL (ref 6–23)
CO2: 26 mEq/L (ref 19–32)
Calcium: 9.1 mg/dL (ref 8.4–10.5)
Chloride: 103 mEq/L (ref 96–112)
Creatinine, Ser: 0.68 mg/dL (ref 0.40–1.20)
GFR: 81.67 mL/min (ref >60.00)
Glucose, Bld: 95 mg/dL (ref 70–99)
Potassium: 4.5 mEq/L (ref 3.5–5.1)
Sodium: 135 mEq/L (ref 135–145)
Total Bilirubin: 0.4 mg/dL (ref 0.2–1.2)
Total Protein: 5.9 g/dL — ABNORMAL LOW (ref 6.0–8.3)

## 2019-05-30 LAB — CBC WITH DIFFERENTIAL/PLATELET
Basophils Absolute: 0.1 10*3/uL (ref 0.0–0.1)
Basophils Relative: 2 % (ref 0.0–3.0)
Eosinophils Absolute: 1 10*3/uL — ABNORMAL HIGH (ref 0.0–0.7)
Eosinophils Relative: 14.9 % — ABNORMAL HIGH (ref 0.0–5.0)
HCT: 36.5 % (ref 36.0–46.0)
Hemoglobin: 12.5 g/dL (ref 12.0–15.0)
Lymphocytes Relative: 25.7 % (ref 12.0–46.0)
Lymphs Abs: 1.8 10*3/uL (ref 0.7–4.0)
MCHC: 34.3 g/dL (ref 30.0–36.0)
MCV: 95.7 fl (ref 78.0–100.0)
Monocytes Absolute: 0.4 10*3/uL (ref 0.1–1.0)
Monocytes Relative: 6.2 % (ref 3.0–12.0)
Neutro Abs: 3.5 10*3/uL (ref 1.4–7.7)
Neutrophils Relative %: 51.2 % (ref 43.0–77.0)
Platelets: 230 10*3/uL (ref 150.0–400.0)
RBC: 3.82 Mil/uL — ABNORMAL LOW (ref 3.87–5.11)
RDW: 12.6 % (ref 11.5–15.5)
WBC: 6.9 10*3/uL (ref 4.0–10.5)

## 2019-05-30 LAB — EOSINOPHIL COUNT
Eosinophils Absolute: 1051 cells/uL — ABNORMAL HIGH (ref 15–500)
Eosinophils Relative: 14.8 %
WBC: 7.1 10*3/uL (ref 3.8–10.8)

## 2019-05-30 MED ORDER — TRIAMCINOLONE ACETONIDE 0.1 % EX CREA: 1.0000 "application " | TOPICAL_CREAM | Freq: Two times a day (BID) | CUTANEOUS | 1 refills | Status: DC

## 2019-05-30 NOTE — Patient Instructions (Signed)
Avoid  Prolonged heat and  Cold to the skin  .   Try to stop scratching  As possible  You could be sensitive to the topical you use for the back pain.  Can use rx steroid  For 2 weeks to see if helps decrease the itching .  Will notify you  of labs when available. Then yearly check   Labs as  Needed .

## 2019-06-01 ENCOUNTER — Other Ambulatory Visit: Payer: Self-pay | Admitting: *Deleted

## 2019-06-01 DIAGNOSIS — D7219 Other eosinophilia: Secondary | ICD-10-CM

## 2019-06-01 NOTE — Progress Notes (Signed)
Results  acceptable range except elevated eosinophil count which can go with allergy or other medical dx and problems .  it is not high enough to be alarmed  since you are well otherwise.  Bu wonder if could be from  your medication  and cause of itching  .   You could try to stop the celebrex  if tolerated  for a few weeks and then repeat  eosinophil count to see if itching and  blood tests corrects .   Place future order  for  cbc and esosinophil count.

## 2019-06-06 ENCOUNTER — Other Ambulatory Visit: Payer: Self-pay

## 2019-06-06 DIAGNOSIS — D7219 Other eosinophilia: Secondary | ICD-10-CM

## 2019-06-06 NOTE — Addendum Note (Signed)
Addended by: Modena Morrow R on: 06/06/2019 08:13 AM   Modules accepted: Orders

## 2019-07-06 ENCOUNTER — Other Ambulatory Visit: Payer: Self-pay

## 2019-07-06 ENCOUNTER — Emergency Department (HOSPITAL_COMMUNITY): Payer: Medicare Other

## 2019-07-06 ENCOUNTER — Emergency Department (HOSPITAL_COMMUNITY)
Admission: EM | Admit: 2019-07-06 | Discharge: 2019-07-06 | Disposition: A | Discharge status: Home / Self Care | Payer: Medicare Other | Attending: Emergency Medicine | Admitting: Emergency Medicine

## 2019-07-06 ENCOUNTER — Encounter (HOSPITAL_COMMUNITY): Payer: Self-pay

## 2019-07-06 DIAGNOSIS — Z87891 Personal history of nicotine dependence: Secondary | ICD-10-CM | POA: Insufficient documentation

## 2019-07-06 DIAGNOSIS — Z79899 Other long term (current) drug therapy: Secondary | ICD-10-CM | POA: Diagnosis not present

## 2019-07-06 DIAGNOSIS — Z7982 Long term (current) use of aspirin: Secondary | ICD-10-CM | POA: Insufficient documentation

## 2019-07-06 DIAGNOSIS — K5900 Constipation, unspecified: Secondary | ICD-10-CM

## 2019-07-06 LAB — CBC WITH DIFFERENTIAL/PLATELET
Abs Immature Granulocytes: 0.02 10*3/uL (ref 0.00–0.07)
Basophils Absolute: 0.1 10*3/uL (ref 0.0–0.1)
Basophils Relative: 1 %
Eosinophils Absolute: 0.6 10*3/uL — ABNORMAL HIGH (ref 0.0–0.5)
Eosinophils Relative: 7 %
HCT: 37.5 % (ref 36.0–46.0)
Hemoglobin: 12.3 g/dL (ref 12.0–15.0)
Immature Granulocytes: 0 %
Lymphocytes Relative: 17 %
Lymphs Abs: 1.7 10*3/uL (ref 0.7–4.0)
MCH: 33 pg (ref 26.0–34.0)
MCHC: 32.8 g/dL (ref 30.0–36.0)
MCV: 100.5 fL — ABNORMAL HIGH (ref 80.0–100.0)
Monocytes Absolute: 0.7 10*3/uL (ref 0.1–1.0)
Monocytes Relative: 7 %
Neutro Abs: 6.5 10*3/uL (ref 1.7–7.7)
Neutrophils Relative %: 68 %
Platelets: 241 10*3/uL (ref 150–400)
RBC: 3.73 MIL/uL — ABNORMAL LOW (ref 3.87–5.11)
RDW: 12.2 % (ref 11.5–15.5)
WBC: 9.5 10*3/uL (ref 4.0–10.5)
nRBC: 0 % (ref 0.0–0.2)

## 2019-07-06 LAB — COMPREHENSIVE METABOLIC PANEL
ALT: 16 U/L (ref 0–44)
AST: 33 U/L (ref 15–41)
Albumin: 3.8 g/dL (ref 3.5–5.0)
Alkaline Phosphatase: 55 U/L (ref 38–126)
Anion gap: 8 (ref 5–15)
BUN: 14 mg/dL (ref 8–23)
CO2: 25 mmol/L (ref 22–32)
Calcium: 8.6 mg/dL — ABNORMAL LOW (ref 8.9–10.3)
Chloride: 103 mmol/L (ref 98–111)
Creatinine, Ser: 0.59 mg/dL (ref 0.44–1.00)
GFR calc Af Amer: >60 mL/min (ref >60)
GFR calc non Af Amer: >60 mL/min (ref >60)
Glucose, Bld: 101 mg/dL — ABNORMAL HIGH (ref 70–99)
Potassium: 4.7 mmol/L (ref 3.5–5.1)
Sodium: 136 mmol/L (ref 135–145)
Total Bilirubin: 0.9 mg/dL (ref 0.3–1.2)
Total Protein: 6.6 g/dL (ref 6.5–8.1)

## 2019-07-06 MED ORDER — LIDOCAINE 5 % EX OINT: 1.0000 "application " | TOPICAL_OINTMENT | Freq: Every day | CUTANEOUS | 1 refills | Status: DC | PRN

## 2019-07-06 MED ORDER — DILTIAZEM GEL 2 %: Freq: Once | CUTANEOUS | Status: DC

## 2019-07-06 MED ORDER — SORBITOL 70 % SOLN
960.0000 mL | TOPICAL_OIL | Freq: Once | ORAL | Status: DC
  Filled 2019-07-06: qty 473

## 2019-07-06 MED ORDER — LACTULOSE 10 GM/15ML PO SOLN
30.0000 g | Freq: Once | ORAL | Status: AC
  Administered 2019-07-06: 30 g via ORAL
  Filled 2019-07-06: qty 60

## 2019-07-06 MED ORDER — LIDOCAINE HCL URETHRAL/MUCOSAL 2 % EX GEL
1.0000 "application " | Freq: Once | CUTANEOUS | Status: DC
  Filled 2019-07-06: qty 30

## 2019-07-06 NOTE — ED Notes (Signed)
ED Provider at bedside. 

## 2019-07-06 NOTE — ED Provider Notes (Signed)
Susan Ford Provider Note   CSN: [id number] Arrival date & time: 07/06/19  1012     History Chief Complaint  Patient presents with  . Constipation    Susan Ford is a 86 y.o. female.  Susan Ford is a 86 year old female with past medical history significant for GERD and arthritis who presents with constipation.  Patient states that she is unable to have a bowel movement for the last week.  Patient is a retired Marine scientist, and she attempted to disimpact herself without success.  Also tried MiraLAX as needed with no relief.  States that she has perirectal pain and the urge to defecate, but nothing comes out whenever she sits on the toilet.  She came to the ER in hopes of helping to bowel movement.  Denies any fevers, S OB, chest pain, abdominal pain, or changes in her bladder habits.        Past Medical History:  Diagnosis Date  . Arthritis   . Cataract 2007   bilateral cat ext   . Cholecystitis   . Complication of anesthesia Feb 21, 2014   atrial fib after cholecystectomy, lasted a few hours, none since  . Dysrhythmia 02-21-2014   after cholecystectomy had atrial fib for a few hours, none since  . GERD (gastroesophageal reflux disease)    Had endoscopy no history of Barrett's  . Hx of nonmelanoma skin cancer   . Unspecified hereditary and idiopathic peripheral neuropathy     Patient Active Problem List   Diagnosis Date Noted  . Mild cognitive impairment 10/03/2017  . Gait abnormality 06/29/2016  . Chronic bilateral low back pain without sciatica 06/29/2016  . Memory loss 2015/11/10  . Death of husband 2014/05/07  . Bereavement due to life event May 07, 2014  . Absolute anemia 04/08/2014  . Atrial flutter ([place]) 02/23/2014  . Acute calculous cholecystitis 02/21/2014  . Spinal stenosis of lumbar region 01/04/2014  . Recent bereavement 10/25/2013  . Arthritis 10/25/2013  . Hyperlipidemia 10/23/2013  . Hereditary and idiopathic  peripheral neuropathy   . Nausea alone 07/14/2012  . Neuropathy, peripheral 05/05/2011  . High risk medication use 05/05/2011  . Subcutaneous emphysema ([place]) 05/05/2011  . Acute respiratory infection 04/03/2011  . Cough 04/03/2011  . Neuropathy 04/03/2011  . Osteoarthritis 05/15/2010  . STIFFNESS OF JOINT NEC ANKLE AND FOOT 05/15/2010  . GANGLION CYST, WRIST, RIGHT 05/15/2010  . VERTIGO, BENIGN PAROXYSMAL POSITION 01/31/2007    Past Surgical History:  Procedure Laterality Date  . ABDOMINAL HYSTERECTOMY  15 yrs ago   complete  . BREAST CYST ASPIRATION    . CHOLECYSTECTOMY N/A 02/21/2014   Procedure: LAPAROSCOPIC CHOLECYSTECTOMY WITH INTRAOPERATIVE CHOLANGIOGRAM;  Surgeon: Doreen Salvage, MD;  Location: [facility];  Service: General;  Laterality: N/A;  . ENDOSCOPIC RETROGRADE CHOLANGIOPANCREATOGRAPHY (ERCP) WITH PROPOFOL N/A 05/29/2014   Procedure: ENDOSCOPIC RETROGRADE CHOLANGIOPANCREATOGRAPHY (ERCP) WITH PROPOFOL;  Surgeon: Arta Silence, MD;  Location: [facility];  Service: Endoscopy;  Laterality: N/A;  . ERCP N/A 02/24/2014   Procedure: ENDOSCOPIC RETROGRADE CHOLANGIOPANCREATOGRAPHY (ERCP);  Surgeon: Arta Silence, MD;  Location[facility] ENDOSCOPY;  Service: Endoscopy;  Laterality: N/A;  . EYE SURGERY Bilateral 2007   both eyes cataracts with lens replacments  . LUMBAR LAMINECTOMY/DECOMPRESSION MICRODISCECTOMY  02/01/2012   Procedure: LUMBAR LAMINECTOMY/DECOMPRESSION MICRODISCECTOMY 2 LEVELS;  Surgeon: Kristeen Miss, MD;  Location: [facility];  Service: Neurosurgery;  Laterality: Left;  Left Lumbar Four-Five Lumbar Five-Sacral One Laminectomies/Foraminotomies/Microscope  . SPYGLASS CHOLANGIOSCOPY N/A 05/29/2014   Procedure: SPYGLASS CHOLANGIOSCOPY;  Surgeon:  Arta Silence, MD;  Location: [facility];  Service: Endoscopy;  Laterality: N/A;  . TONSILLECTOMY  age 89     OB History   No obstetric history on file.     Family History  Problem Relation Age of Onset  . Neurodegenerative disease  Mother        Mikey Bussing Drager died at 21  . Alcohol abuse Other   . Heart disease Other     Social History   Tobacco Use  . Smoking status: Former Smoker    Packs/day: 0.25    Years: 7.00    Pack years: 1.75    Types: Cigarettes    Quit date: 04/27/1963    Years since quitting: 56.2  . Smokeless tobacco: Never Used  Substance Use Topics  . Alcohol use: Yes    Alcohol/week: 7.0 standard drinks    Types: 7 Glasses of wine per week    Comment: 1 glass of wine daily  . Drug use: No    Types: Hydrocodone    Home Medications Prior to Admission medications   Medication Sig Start Date End Date Taking? Authorizing Provider  acetaminophen (TYLENOL) 500 MG tablet Take 500 mg by mouth every 6 (six) hours as needed.    [provider]  ascorbic acid (VITAMIN C) 1000 MG tablet Take 1,000 mg by mouth daily after lunch.     [provider]  aspirin 81 MG tablet Take 81 mg by mouth daily after lunch.     [provider]  calcium citrate-vitamin D (CITRACAL+D) 315-200 MG-UNIT per tablet Take 1 tablet by mouth daily.     [provider]  celecoxib (CELEBREX) 200 MG capsule Take 1 capsule (200 mg total) by mouth daily. 09/07/16   Panosh, Standley Brooking, MD  estradiol (CLIMARA - DOSED IN MG/24 HR) 0.025 mg/24hr patch Place 1 patch on once a week on Saturday 05/18/19   Panosh, Standley Brooking, MD  famotidine (PEPCID) 20 MG tablet Take 20 mg by mouth daily as needed.     [provider]  gabapentin (NEURONTIN) 600 MG tablet Take 1 tablet (600 mg total) by mouth 4 (four) times daily. 11/13/18   Suzzanne Cloud, NP  lidocaine [facility]) 3 % CREA cream TOPICALLY 3 TIMES A DAY 06/29/18   [provider]  Multiple Vitamin (MULTIVITAMIN) tablet Take 1 tablet by mouth daily after lunch.     [provider]  simvastatin (ZOCOR) 10 MG tablet TAKE 1 TABLET BY MOUTH EVERYDAY AT BEDTIME 05/18/19   Panosh, Standley Brooking, MD  triamcinolone cream (KENALOG) 0.1 % Apply 1 application  topically 2 (two) times daily. 05/30/19   Panosh, Standley Brooking, MD    Allergies    Keflex [cephalexin], Lyrica [pregabalin], Oxytetracycline, and Memantine  Review of Systems   Review of Systems  Constitutional: Negative.   HENT: Negative.   Eyes: Negative.   Respiratory: Negative.   Cardiovascular: Negative.   Gastrointestinal: Negative.   Endocrine: Negative.   Genitourinary: Negative.   Musculoskeletal: Negative.   Skin: Negative.   Allergic/Immunologic: Negative.   Neurological: Negative.   Hematological: Negative.   Psychiatric/Behavioral: Negative.     Physical Exam Updated Vital Signs BP 124/67   Pulse 93   Temp 98 F (36.7 C) (Oral)   Resp 15   Ht 5\' 4"  (1.626 m)   Wt 59 kg   SpO2 96%   BMI 22.31 kg/m   Physical Exam Vitals reviewed.  Constitutional:      General: She is  not in acute distress.    Appearance: Normal appearance. She is normal weight. She is not ill-appearing, toxic-appearing or diaphoretic.  HENT:     Head: Normocephalic and atraumatic.  Eyes:     Extraocular Movements: Extraocular movements intact.  Cardiovascular:     Rate and Rhythm: Normal rate and regular rhythm.     Pulses: Normal pulses.     Heart sounds: Normal heart sounds. No murmur. No friction rub. No gallop.   Pulmonary:     Effort: Pulmonary effort is normal. No respiratory distress.     Breath sounds: Normal breath sounds. No wheezing or rales.  Abdominal:     General: Abdomen is flat. Bowel sounds are normal. There is no distension.     Palpations: Abdomen is soft.     Tenderness: There is no abdominal tenderness. There is no guarding.     Comments: Stool contents can be palpated  Neurological:     Mental Status: She is alert.     ED Results / Procedures / Treatments   Labs (all labs ordered are listed, but only abnormal results are displayed) Labs Reviewed  CBC WITH DIFFERENTIAL/PLATELET - Abnormal; Notable for the following components:      Result Value   RBC 3.73  (*)    MCV 100.5 (*)    Eosinophils Absolute 0.6 (*)    All other components within normal limits  COMPREHENSIVE METABOLIC PANEL - Abnormal; Notable for the following components:   Glucose, Bld 101 (*)    Calcium 8.6 (*)    All other components within normal limits   EKG None  Radiology DG Abdomen 1 View  Result Date: 07/06/2019 CLINICAL DATA:  Constipation. EXAM: ABDOMEN - 1 VIEW COMPARISON:  None. FINDINGS: The bowel gas pattern is normal. Possible mild amount of stool seen throughout the colon. No radio-opaque calculi or other significant radiographic abnormality are seen. IMPRESSION: Possible mild stool burden. No evidence of bowel obstruction or ileus. Electronically Signed   By: Marijo Conception M.D.   On: 07/06/2019 14:48   Procedures Procedures (including critical care time)  Medications Ordered in ED Medications - No data to display  ED Course  I have reviewed the triage vital signs and the nursing notes.  Pertinent labs & imaging results that were available during my care of the patient were reviewed by me and considered in my medical decision making (see chart for details).    MDM Rules/Calculators/A&P                      KUB demonstrated large stool burden, confirming her reports of constipation. Patient's constipation has resolved after receiving a dose of lactulose.  Advised patient to take MiraLAX on a daily basis instead of every couple of days like she has been doing.  Patient is in agreement.  Will discharge now.   Final Clinical Impression(s) / ED Diagnoses Final diagnoses:  Constipation, unspecified constipation type   Rx / DC Orders ED Discharge Orders    None       Earlene Plater, MD 07/06/19 1538    Drenda Freeze, MD 07/06/19 [phone]

## 2019-07-06 NOTE — ED Notes (Signed)
Patient transported to XR. 

## 2019-07-06 NOTE — Discharge Instructions (Addendum)
Thank you for allowing [place] to take care of you during ED visit.  Below is a summary of what we treated:  1.  Constipation -We gave the lactulose to help you have a bowel movement.  Make sure you take your MiraLAX on a daily basis when you leave.  2.  Follow-up -Call your PCP for follow-up appointment if you feel like it is needed.  They can help you manage her ongoing constipation.

## 2019-07-06 NOTE — ED Triage Notes (Signed)
Pt presents with c/o constipation for one week. Pt reports she is unable to bear down at this point to have a BM because her rectum is so irritated. Pt has taken Miralax earlier in the week with no relief.

## 2019-07-12 NOTE — Progress Notes (Signed)
PATIENT: Susan Ford DOB: 22-Sep-1929  REASON FOR VISIT: follow up HISTORY FROM: patient  HISTORY OF PRESENT ILLNESS: Today 07/16/19  HISTORY  HISTORY  Susan Ford is a 86 year-old right-handed white female,She was a patient of Dr. Erling Cruz, last visit was May 2014   She had a history of lumbar decompression surgery, presenting with chronic low back pain, mild gait difficulty due to pain.  MRI of the lumbar spine 12/25/2010 shows diffuse spondylitic changes and progression of degenerative changes at L3-4 and L4-5 and modest broad disc bulge at L5-S1. There is evidence of neuroforaminal stenosis on the left at L4-5. She has pain in her feet and aching from her knees down.  EMG/NCV 12/24/2011 showing progression of peripheral neuropathy versus a prior study 09/13/2011. There was also a mild overlying acute L5 radiculopathy on the left. She underwent surgical decompression at L4-5 and L5-S1 with bilateral foraminotomies and microdiscectomy on the left at L4-5 02/01/2012 by Dr. Ellene Route, Surgery helped her. She still complains of pain at low back pain, waist line, going down her leg, at both leg.  She denies bowel/bladder incontinence  She has difficulty walking, because of low back pain, worst pain in the morning, she usually take her hydrocodone early morning, then napping for a while, then she can function again.  Previously she has tried Lyrica which caused abdominal cramps, Cymbalta, caused nausea. She had side effects from nortriptyline 20 mg per night. She is on gabapentin 600, 1-1/2 tablets 4 times per day and complains of memory loss on the medication and now questions any improvement in pain. She uses a cane, but not in her house.   She underwent an epidural steroid injection 10/2011 with improvement in her back pain.   She is independent in activities of daily living including driving a car, cooking,cleaning, shopping, and doing laundry.   UPDATE April  20th 2016:YY Last epidural injection was in 2015, she still has a lot of low back pain, she was put on nortriptyline 25 mg qhs, neurontin 648m qid, hydrocodone prn, celebrex 2021mdaily.  We have reviewed MRI lumbar in Feb 2016: Multilevel degenerative disc disease, with mild spinal stenosis, facet arthropathy, mild to moderate foraminal stenosis at different level She complains of constant low back pian, bilatera legs achy pain, triggered by standing for just 10 minutes, she has to sit down resting after standing or walking for short period of time  She is taking Gabapentin 600 mg 4 times a day, Celexa 200 mg every day, recently started nortriptyline 25 mg every night hydrocodone as needed previously tried Cymbalta, without helping her symptoms, untolerable side effect  UPDATE Jan 14 2015: YY She is alone at visit, she still takes celebrex 2004mid, Neurontin 600 mg 4 times a day, she could stand for 10 mintes, then has to sit down because of her low back pain, she also complains of intermittent bilateral lower extremity deep achy pain, She rarely needs narcotics now, denies bowel and bladder incontinence, no bilateral lower extremity numbness  UPDATE Sept 6th 2017:YY Last clinical visit was with CarHoyle Sauer June 2017, she is concerned about her memory loss, Laboratory evaluation in June 2017 showed normal TSH, B12, ESR, negative RPR, mild elevated glucose 109 on BMP, I have personally reviewed MRI of the brain in July 2017, moderate generalized atrophy, mild supratentorium small vessel disease,  She lives alone, still drives, but reported she got lost sometimes, today's Mini-Mental status examination 27 out of 30  UPDATE  June 29 2016:YY She is alone at today's clinical visit, we have personally reviewed MRI of the brain, moderate generalized atrophy, small supratentorium small vessel disease, today she denies significant memory loss, lives alone at her house, still driving,  Her low  back pain has improved, stay at low back, denies radiating pain.  MRI of lumbar spine demonstrate multilevel degenerative changes, moderate bilateral foraminal stenosis at L5-S1, moderate stenosis at L 4-5, prior left L5 laminectomy  UPDATE Mar 22 2017:YYShe complains of worsening low back pain and bilateral foot, distal deep achy pain, She is now taking gabapentin 645m qid, celebrex 2067mqday, she sleeps well, worsening pain after bearingweight, UPDATE 6/10/2019CMMs.GuGala [id number] old female returns for follow-up with history of low back pain and mild cognitive impairment. She is currently on Neurontin 600 mg 4 times daily and Celebrex daily. She also goes to pain management for injections Dr. BaOrpah MelterShe was started on Cymbalta at her last visit however she never got the medication filled. Her back pain is fairly stable. She continues to be independent with activities of daily living. She continues to drive without difficulty. She returns for reevaluation UPDATE12/18/2019CMMs. GuGala Romney8838ear old female returns for follow-up and a history of low back pain and mild cognitive impairment. She remains on Neurontin 600 mg 4 times daily and Celebrex with fair control of her symptoms. She also sees Dr. BaOrpah Melteror pain management injections.Her back pain is fairly stable. She continues to be independent with activities of daily living. She continues to live alone. She continues to drive without difficulty. She has no new neurologic complaints. She reports that her memory is stable.She returns for reevaluation Update 11/13/2018 SS: [name] a [age]ear old female with history of low back pain and mild cognitive impairment.  She remains on gabapentin 600 mg 4 times daily, and Celebrex.  She sees a doctor for pain management injections.  She says she is no longer getting the injections, was referred to a chiropractor.  She does not feel that the chiropractor is especially  helpful, she is thinking of stopping.  She continues to complain of some mild intermittent numbness in both feet.  She says she sleeps well at night, gabapentin is beneficial.  The sensation of numbness in her feet is no worse at any certain time during the day. She has not had any falls.  She continues to live alone, manages all of her ADLs, housework, finances, and drives a car without difficulty.  Her husband passed away 5 years ago, she is a retired [employer] scientist Update July 16, 2019 SS: Here today alone.  Continues to see Dr. BaBrien Fewno longer doing injections.  Went to chiropractor without benefit.  She is taking gabapentin 600 mg 4 times a day.  She reports when walking prolonged, will have weakness in her legs, aching.  When she is sitting she does well.  She says she sleeps well at night, her feet do not keep her up.  She has previously been on Lyrica and Cymbalta but could not tolerate due to side effect.  She takes Tylenol for pain.  She has not had any falls.  She continues to live alone and drives a car without difficulty.  She feels her memory is stable.  She is also on Celebrex for back pain.  She went to the ER a few weeks ago for constipation.  REVIEW OF SYSTEMS: Out of a complete 14 system review of symptoms, the patient complains only of the following symptoms, and all other reviewed  systems are negative.  Back pain, weakness  ALLERGIES: Allergies  Allergen Reactions  . Keflex [Cephalexin]     Dizziness  . Lyrica [Pregabalin] Other (See Comments)    "High Fever"   . Oxytetracycline Other (See Comments)    "High Fever" caused by terramycin  . Memantine Rash    HOME MEDICATIONS: Outpatient Medications Prior to Visit  Medication Sig Dispense Refill  . acetaminophen (TYLENOL) 500 MG tablet Take 500 mg by mouth every 6 (six) hours as needed for mild pain.     Marland Kitchen ascorbic acid (VITAMIN C) 1000 MG tablet Take 1,000 mg by mouth daily after lunch.     Marland Kitchen aspirin 81 MG tablet Take 81 mg  by mouth daily after lunch.     . celecoxib (CELEBREX) 200 MG capsule Take 1 capsule (200 mg total) by mouth daily. 90 capsule 0  . estradiol (CLIMARA - DOSED IN MG/24 HR) 0.025 mg/24hr patch Place 1 patch on once a week on Saturday (Patient taking differently: Place 0.025 mg onto the skin once a week. Place 1 patch on once a week on Saturday) 8 patch 2  . famotidine (PEPCID) 20 MG tablet Take 20 mg by mouth daily as needed for heartburn or indigestion.     . gabapentin (NEURONTIN) 600 MG tablet Take 1 tablet (600 mg total) by mouth 4 (four) times daily. 360 tablet 3  . levocetirizine (XYZAL) 5 MG tablet Take 10 mg by mouth at bedtime.    . Multiple Vitamin (MULTIVITAMIN) tablet Take 1 tablet by mouth daily after lunch.     . simvastatin (ZOCOR) 10 MG tablet TAKE 1 TABLET BY MOUTH EVERYDAY AT BEDTIME (Patient taking differently: Take 10 mg by mouth daily at 6 PM. ) 90 tablet 0  . lidocaine (XYLOCAINE) 5 % ointment Apply 1 application topically daily as needed. (Patient not taking: Reported on 07/16/2019) 35.44 g 1  . triamcinolone cream (KENALOG) 0.1 % Apply 1 application topically 2 (two) times daily. (Patient not taking: Reported on 07/16/2019) 30 g 1   No facility-administered medications prior to visit.    PAST MEDICAL HISTORY: Past Medical History:  Diagnosis Date  . Arthritis   . Cataract 2007   bilateral cat ext   . Cholecystitis   . Complication of anesthesia Feb 21, 2014   atrial fib after cholecystectomy, lasted a few hours, none since  . Dysrhythmia 02-21-2014   after cholecystectomy had atrial fib for a few hours, none since  . GERD (gastroesophageal reflux disease)    Had endoscopy no history of Barrett's  . Hx of nonmelanoma skin cancer   . Unspecified hereditary and idiopathic peripheral neuropathy     PAST SURGICAL HISTORY: Past Surgical History:  Procedure Laterality Date  . ABDOMINAL HYSTERECTOMY  15 yrs ago   complete  . BREAST CYST ASPIRATION    .  CHOLECYSTECTOMY N/A 02/21/2014   Procedure: LAPAROSCOPIC CHOLECYSTECTOMY WITH INTRAOPERATIVE CHOLANGIOGRAM;  Surgeon: Doreen Salvage, MD;  Location: [facility];  Service: General;  Laterality: N/A;  . ENDOSCOPIC RETROGRADE CHOLANGIOPANCREATOGRAPHY (ERCP) WITH PROPOFOL N/A 05/29/2014   Procedure: ENDOSCOPIC RETROGRADE CHOLANGIOPANCREATOGRAPHY (ERCP) WITH PROPOFOL;  Surgeon: Arta Silence, MD;  Location: [facility];  Service: Endoscopy;  Laterality: N/A;  . ERCP N/A 02/24/2014   Procedure: ENDOSCOPIC RETROGRADE CHOLANGIOPANCREATOGRAPHY (ERCP);  Surgeon: Arta Silence, MD;  [facility];  Service: Endoscopy;  Laterality: N/A;  . EYE SURGERY Bilateral 2007   both eyes cataracts with lens replacments  . LUMBAR LAMINECTOMY/DECOMPRESSION MICRODISCECTOMY  02/01/2012  Procedure: LUMBAR LAMINECTOMY/DECOMPRESSION MICRODISCECTOMY 2 LEVELS;  Surgeon: Kristeen Miss, MD;  Location: [facility];  Service: Neurosurgery;  Laterality: Left;  Left Lumbar Four-Five Lumbar Five-Sacral One Laminectomies/Foraminotomies/Microscope  . SPYGLASS CHOLANGIOSCOPY N/A 05/29/2014   Procedure: SPYGLASS CHOLANGIOSCOPY;  Surgeon: Arta Silence, MD;  Location: [facility];  Service: Endoscopy;  Laterality: N/A;  . TONSILLECTOMY  age 28    FAMILY HISTORY: Family History  Problem Relation Age of Onset  . Neurodegenerative disease Mother        Mikey Bussing Drager died at 13  . Alcohol abuse Other   . Heart disease Other     SOCIAL HISTORY: Social History   Socioeconomic History  . Marital status: Widowed    Spouse name: Not on file  . Number of children: 3  . Years of education: 15 years  . Highest education level: Associate degree: occupational, Hotel manager, or vocational program  Occupational History  . Occupation: Retired    Comment: Therapist, sports  Tobacco Use  . Smoking status: Former Smoker    Packs/day: 0.25    Years: 7.00    Pack years: 1.75    Types: Cigarettes    Quit date: 04/27/1963    Years since quitting: 56.2  .  Smokeless tobacco: Never Used  Substance and Sexual Activity  . Alcohol use: Yes    Alcohol/week: 7.0 standard drinks    Types: 7 Glasses of wine per week    Comment: 1 glass of wine daily  . Drug use: No    Types: Hydrocodone  . Sexual activity: Yes  Other Topics Concern  . 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 Misses working the floor.          Social Determinants of Health   Financial Resource Strain: Low Risk   . Difficulty of Paying Living Expenses: Not hard at all  Food Insecurity: No Food Insecurity  . Worried About Charity fundraiser in the Last Year: Never true  . Ran Out of Food in the Last Year: Never true  Transportation Needs: No Transportation Needs  . Lack of Transportation (Medical): No  . Lack of Transportation (Non-Medical): No  Physical Activity: Inactive  . Days of Exercise per Week: 0 days  . Minutes of Exercise per Session: 0 min  Stress: No Stress Concern Present  . Feeling of Stress : Only a little  Social Connections: Slightly Isolated  . Frequency of Communication with Friends and Family: More than three times a week  . Frequency of Social Gatherings with Friends and Family: Once a week  . Attends Religious Services: More than 4 times per year  . Active Member of Clubs or Organizations: Yes  . Attends Archivist Meetings: More than 4 times per year  . Marital Status: Widowed  Intimate Partner Violence:   . Fear of Current or Ex-Partner:   . Emotionally Abused:   Marland Kitchen Physically Abused:   . Sexually Abused:    PHYSICAL EXAM  Vitals:  07/16/19 1019  BP:  109/72  Pulse: 78  Temp: (!) 97.5 F (36.4 C)  Weight: 121 lb 12.8 oz (55.2 kg)  Height: _0  (1.626 m)   Body mass index is 20.91 kg/m.  Generalized: Well developed, in no acute distress  MMSE - Mini Mental State Exam 11/13/2018 04/12/2018 10/03/2017  Not completed: - (No Data) -  Orientation to time _1 Orientation to Place _2 Registration _3 Attention/ Calculation _4 Recall _5 Language- name 2 objects _6 Language- repeat _7 Language- follow 3 step command _8 Language- read & follow direction _9 Write a sentence _10 Copy design _11 Total score _12 Neurological examination  Mentation: Alert oriented to time, place, history taking. Follows all commands speech and language fluent Cranial nerve II-XII: Pupils were equal round reactive to light. Extraocular movements were full, visual field were full on confrontational test. Facial sensation and strength were normal. Head turning and shoulder shrug were normal and symmetric. Motor: Good strength in all extremities Sensory: Sensory testing is intact to soft touch on all 4 extremities. No evidence of extinction is noted.  Coordination: Cerebellar testing reveals good finger-nose-finger and heel-to-shin bilaterally.  Gait and station: Able to rise from seated position without pushoff, gait is short stepping, mild shuffling, good armswing, no assistive device Reflexes: Deep tendon reflexes are symmetric but diminished bilaterally  DIAGNOSTIC DATA (LABS, IMAGING, TESTING) - I reviewed patient records, labs, notes, testing and imaging myself where available.  Lab Results  Component Value Date   WBC 9.5 07/06/2019   HGB 12.3 07/06/2019   HCT 37.5 07/06/2019   MCV 100.5 (H) 07/06/2019   PLT 241 07/06/2019      Component Value Date/Time   NA 136 07/06/2019 1308   NA 133 (A) 07/16/2016 0000   K 4.7 07/06/2019 1308   CL 103 07/06/2019 1308   CO2 25 07/06/2019 1308   GLUCOSE 101  (H) 07/06/2019 1308   BUN 14 07/06/2019 1308   BUN 16 07/16/2016 0000   CREATININE 0.59 07/06/2019 1308   CALCIUM 8.6 (L) 07/06/2019 1308   PROT 6.6 07/06/2019 1308   ALBUMIN 3.8 07/06/2019 1308   AST 33 07/06/2019 1308   ALT 16 07/06/2019 1308   ALKPHOS 55 07/06/2019 1308   BILITOT 0.9 07/06/2019 1308   GFRNONAA >60 07/06/2019 1308   GFRAA >60 07/06/2019 1308   Lab Results  Component Value Date   CHOL 143 05/30/2019   HDL 62.20 05/30/2019   LDLCALC 57 05/30/2019   LDLDIRECT 153 02/12/2009   TRIG 117.0 05/30/2019   CHOLHDL 2 05/30/2019   Lab Results  Component Value Date   HGBA1C 5.6 02/18/2016   Lab Results  Component Value Date   DGLOVFIE33 295 10/15/2015   Lab Results  Component Value Date   TSH 0.89 07/16/2016      ASSESSMENT AND PLAN 86 y.o. year old female  has a past medical history of Arthritis, Cataract (2007), Cholecystitis, Complication of anesthesia (Feb 21, 2014), Dysrhythmia (02-21-2014), GERD (gastroesophageal reflux disease), nonmelanoma skin cancer, and Unspecified hereditary and idiopathic peripheral neuropathy. here with:  1. Chronic low back pain, lumbar radiculopathy, mild gait abnormality, mild foot numbness  -sees Dr. Brien Few pain management specialist, was sent to chiropractor, did not help much -Previously cannot tolerate Cymbalta or Lyrica -Would  like to try dose increase of gabapentin for achy sensation during legs, will try gabapentin 600 mg up to 5 times a day if needed, try for a few weeks then can adjust the prescription if beneficial -May also consider Keppra, nortriptyline if needed, not sure any medication will completely relieve symptoms -Follow-up in 6 months or sooner if needed  2. Abnormal MRI of the brain, evidence of mild supratentorium small vessel disease 2017 - Continue aspirin 81 mg daily   3. Mild cognitive impairment -MMSE 28/30 In July 2020 -Could not tolerate Namenda in the past -Has not tried Aricept that she can  recall, but doesn't want to trial at this time  -Encouraged her to continue activity, she remains very independent   I spent 20 minutes of face-to-face and non-face-to-face time with patient. This included previsit chart review, lab review, study review, order entry, electronic health record documentation, patient education.   Butler Denmark, AGNP-C, DNP 07/16/2019, 10:22 AM [facility] [address] [place], [place] [address] [phone]

## 2019-07-16 ENCOUNTER — Ambulatory Visit (INDEPENDENT_AMBULATORY_CARE_PROVIDER_SITE_OTHER): Payer: Medicare Other | Admitting: Neurology

## 2019-07-16 ENCOUNTER — Encounter: Payer: Self-pay | Admitting: Neurology

## 2019-07-16 ENCOUNTER — Other Ambulatory Visit: Payer: Self-pay

## 2019-07-16 VITALS — BP 109/72 | HR 78 | Temp 97.5°F | Ht 64.0 in | Wt 121.8 lb

## 2019-07-16 DIAGNOSIS — G609 Hereditary and idiopathic neuropathy, unspecified: Secondary | ICD-10-CM | POA: Diagnosis not present

## 2019-07-16 DIAGNOSIS — R413 Other amnesia: Secondary | ICD-10-CM

## 2019-07-16 NOTE — Patient Instructions (Signed)
Try slightly higher dose of gabapentin 600 mg 5 times daily Let me know if beneficial, will adjust the prescription See you back in 6 months or sooner if needed

## 2019-07-17 NOTE — Progress Notes (Signed)
I have reviewed and agreed above plan. 

## 2019-08-01 ENCOUNTER — Telehealth: Payer: Self-pay | Admitting: Neurology

## 2019-08-01 NOTE — Telephone Encounter (Signed)
Pt has called to inform that the recommended increase of the gabapentin (NEURONTIN) 600 MG tablet has not worked so she is going back to original dose.  Pt stated to call only if RN has questions for her other wise no need

## 2019-08-01 NOTE — Telephone Encounter (Signed)
Event noted. Appreciate the call.

## 2019-08-20 DIAGNOSIS — Z85828 Personal history of other malignant neoplasm of skin: Secondary | ICD-10-CM | POA: Diagnosis not present

## 2019-08-20 DIAGNOSIS — D1801 Hemangioma of skin and subcutaneous tissue: Secondary | ICD-10-CM | POA: Diagnosis not present

## 2019-08-20 DIAGNOSIS — L853 Xerosis cutis: Secondary | ICD-10-CM | POA: Diagnosis not present

## 2019-08-20 DIAGNOSIS — L821 Other seborrheic keratosis: Secondary | ICD-10-CM | POA: Diagnosis not present

## 2019-08-20 DIAGNOSIS — L590 Erythema ab igne [dermatitis ab igne]: Secondary | ICD-10-CM | POA: Diagnosis not present

## 2019-08-22 DIAGNOSIS — H52223 Regular astigmatism, bilateral: Secondary | ICD-10-CM | POA: Diagnosis not present

## 2019-08-22 DIAGNOSIS — H524 Presbyopia: Secondary | ICD-10-CM | POA: Diagnosis not present

## 2019-08-22 DIAGNOSIS — H5212 Myopia, left eye: Secondary | ICD-10-CM | POA: Diagnosis not present

## 2019-08-22 DIAGNOSIS — H5201 Hypermetropia, right eye: Secondary | ICD-10-CM | POA: Diagnosis not present

## 2019-08-22 DIAGNOSIS — Z961 Presence of intraocular lens: Secondary | ICD-10-CM | POA: Diagnosis not present

## 2019-08-23 ENCOUNTER — Other Ambulatory Visit: Payer: Self-pay | Admitting: Internal Medicine

## 2019-10-25 ENCOUNTER — Telehealth: Payer: Self-pay | Admitting: Internal Medicine

## 2019-10-25 NOTE — Telephone Encounter (Signed)
There is no telephone encounter in the past 2 months about this request other than the telephone call earlier today that is already being reviewed.

## 2019-10-25 NOTE — Telephone Encounter (Signed)
Estradiol .025 mg patches needs Doctor approval at Pharmacy.  Pharmacy- [facility]

## 2019-10-25 NOTE — Telephone Encounter (Signed)
Last OV 05/30/2019  Last filled 05/18/19, 8 patches with 2 refills  OK to continue?

## 2019-10-25 NOTE — Telephone Encounter (Signed)
Pt is calling  Back about her prescription  That she call in for 4 days  Ago and want a call back to let her know if it was sent to  [id number] - [place], [place]. Phone:  [phone]  Fax:  [phone]

## 2019-10-26 ENCOUNTER — Telehealth: Payer: Self-pay

## 2019-10-26 ENCOUNTER — Other Ambulatory Visit: Payer: Self-pay

## 2019-10-26 MED ORDER — ESTRADIOL 0.025 MG/24HR TD PTWK: 0.0250 mg | MEDICATED_PATCH | TRANSDERMAL | 2 refills | Status: DC

## 2019-10-26 NOTE — Telephone Encounter (Signed)
Medication has been refilled and sent to pharmacy requested.

## 2019-10-26 NOTE — Telephone Encounter (Signed)
Ok to continue for 6 months worth

## 2019-10-26 NOTE — Telephone Encounter (Signed)
Received a fax from [facility] for a prior authorization for Estradiol 0.025 mg patch. Sent to Cover My Meds Key # [id number] Case ID [id number] Approved 09/26/2019 through 10/25/20 1st Dx Estrogen Deficiency Code E28.39 2nd Dx Osteoporosis Code M81.0

## 2019-11-19 ENCOUNTER — Other Ambulatory Visit: Payer: Self-pay | Admitting: Neurology

## 2019-11-19 DIAGNOSIS — J3089 Other allergic rhinitis: Secondary | ICD-10-CM | POA: Diagnosis not present

## 2019-11-19 DIAGNOSIS — L299 Pruritus, unspecified: Secondary | ICD-10-CM | POA: Diagnosis not present

## 2019-11-26 ENCOUNTER — Telehealth: Payer: Self-pay | Admitting: *Deleted

## 2019-11-26 NOTE — Telephone Encounter (Signed)
Patient called after hours line on 11/24/2019 at 8:16:49 AM. --Caller states that she is having burning/frequency with urination.  [1] SEVERE pain with urination (e.g.,excruciating) AND [2] not improved after 2 hours of pain medicine and Sitz bath.   Care Advice Given Per Guideline SEE HCP WITHIN 4 HOURS (OR PCP TRIAGE): * IF OFFICE WILL BE CLOSED AND NO PCP (PRIMARY CARE PROVIDER) SECOND-LEVEL TRIAGE: You need to be seen within the next 3 or 4 hours. A nearby Urgent Care Center [facility]) is often a good source of care. Another choice is to go to the ED. Go sooner if you become worse. CALL BACK IF: * You become worse. CARE ADVICE given per Urination Pain - Female (Adult) guideline.  Called patient to schedule appointment, left message for patient to return call to office.

## 2019-12-28 DIAGNOSIS — Z23 Encounter for immunization: Secondary | ICD-10-CM | POA: Diagnosis not present

## 2020-01-07 ENCOUNTER — Other Ambulatory Visit: Payer: Self-pay | Admitting: Internal Medicine

## 2020-01-09 ENCOUNTER — Telehealth: Payer: Self-pay | Admitting: Internal Medicine

## 2020-01-09 NOTE — Telephone Encounter (Signed)
Pt call and stated she went to the pharmacy to pick up her prescription and she only had one box she also stated that she always get two boxes and want it call in for the other one.

## 2020-01-09 NOTE — Telephone Encounter (Signed)
Called patient and she stated that she always gets 2 boxes of her estrogen patches and her pharmacy only gave her one box this time. I let patient know that her prescription dosage is for 2 boxes and she will need to contact the pharmacy for them to correct for her. Patient verbalized an understanding.

## 2020-01-16 ENCOUNTER — Ambulatory Visit (INDEPENDENT_AMBULATORY_CARE_PROVIDER_SITE_OTHER): Payer: Medicare Other | Admitting: Neurology

## 2020-01-16 ENCOUNTER — Encounter: Payer: Self-pay | Admitting: Neurology

## 2020-01-16 VITALS — BP 118/70 | HR 80 | Wt 123.0 lb

## 2020-01-16 DIAGNOSIS — M545 Low back pain, unspecified: Secondary | ICD-10-CM

## 2020-01-16 DIAGNOSIS — G8929 Other chronic pain: Secondary | ICD-10-CM

## 2020-01-16 DIAGNOSIS — R413 Other amnesia: Secondary | ICD-10-CM | POA: Diagnosis not present

## 2020-01-16 MED ORDER — GABAPENTIN 600 MG PO TABS: ORAL_TABLET | ORAL | 3 refills | Status: DC

## 2020-01-16 NOTE — Progress Notes (Signed)
PATIENT: Susan Ford DOB: [birth year]  REASON FOR VISIT: follow up HISTORY FROM: patient  HISTORY OF PRESENT ILLNESS: Today 01/16/20  HISTORY KILANI JOFFE is a 86 year-old right-handed white female,She was a patient of Dr. Erling Cruz, last visit was May 2014   She had a history of lumbar decompression surgery, presenting with chronic low back pain, mild gait difficulty due to pain.  MRI of the lumbar spine 12/25/2010 shows diffuse spondylitic changes and progression of degenerative changes at L3-4 and L4-5 and modest broad disc bulge at L5-S1. There is evidence of neuroforaminal stenosis on the left at L4-5. She has pain in her feet and aching from her knees down.  EMG/NCV 12/24/2011 showing progression of peripheral neuropathy versus a prior study 09/13/2011. There was also a mild overlying acute L5 radiculopathy on the left. She underwent surgical decompression at L4-5 and L5-S1 with bilateral foraminotomies and microdiscectomy on the left at L4-5 02/01/2012 by Dr. Ellene Route, Surgery helped her. She still complains of pain at low back pain, waist line, going down her leg, at both leg.  She denies bowel/bladder incontinence  She has difficulty walking, because of low back pain, worst pain in the morning, she usually take her hydrocodone early morning, then napping for a while, then she can function again.  Previously she has tried Lyrica which caused abdominal cramps, Cymbalta, caused nausea. She had side effects from nortriptyline 20 mg per night. She is on gabapentin 600, 1-1/2 tablets 4 times per day and complains of memory loss on the medication and now questions any improvement in pain. She uses a cane, but not in her house.   She underwent an epidural steroid injection 10/2011 with improvement in her back pain.   She is independent in activities of daily living including driving a car, cooking,cleaning, shopping, and doing laundry.   Update July 16, 2019 SS:  Here today alone.  Continues to see Dr. Brien Few, no longer doing injections.  Went to chiropractor without benefit.  She is taking gabapentin 600 mg 4 times a day.  She reports when walking prolonged, will have weakness in her legs, aching.  When she is sitting she does well.  She says she sleeps well at night, her feet do not keep her up.  She has previously been on Lyrica and Cymbalta but could not tolerate due to side effect.  She takes Tylenol for pain.  She has not had any falls.  She continues to live alone and drives a car without difficulty.  She feels her memory is stable.  She is also on Celebrex for back pain.  She went to the ER a few weeks ago for constipation.  Update January 16, 2020 SS: Seeing Dr. Brien Few, no longer doing injections, uses OTC cream.  Remains on gabapentin 600 mg 4 times a day, decided to not go to 5 times daily.  With prolonged walking, she will have low back pain.  No falls or weakness in the legs.  She sleeps well at night.  She lives alone and drives a car without difficulty.  Memory is overall stable, MMSE 29/30.  Will take Tylenol Extra Strength for the back. Looking forward to taking the train to [facility] for Christmas.   REVIEW OF SYSTEMS: Out of a complete 14 system review of symptoms, the patient complains only of the following symptoms, and all other reviewed systems are negative.  Back pain  ALLERGIES: Allergies  Allergen Reactions  . Keflex [Cephalexin]  Dizziness  . Lyrica [Pregabalin] Other (See Comments)    "High Fever"   . Oxytetracycline Other (See Comments)    "High Fever" caused by terramycin  . Memantine Rash    HOME MEDICATIONS: Outpatient Medications Prior to Visit  Medication Sig Dispense Refill  . acetaminophen (TYLENOL) 500 MG tablet Take 500 mg by mouth every 6 (six) hours as needed for mild pain.     Marland Kitchen ascorbic acid (VITAMIN C) 1000 MG tablet Take 1,000 mg by mouth daily after lunch.     Marland Kitchen aspirin 81 MG tablet Take 81 mg by  mouth daily after lunch.     . celecoxib (CELEBREX) 200 MG capsule Take 1 capsule (200 mg total) by mouth daily. 90 capsule 0  . estradiol (CLIMARA - DOSED IN MG/24 HR) 0.025 mg/24hr patch Place 1 patch (0.025 mg total) onto the skin once a week. Place 1 patch on once a week on Saturday 8 patch 2  . famotidine (PEPCID) 20 MG tablet Take 20 mg by mouth daily as needed for heartburn or indigestion.     . gabapentin (NEURONTIN) 600 MG tablet TAKE 1 TABLET BY MOUTH 4 TIMES DAILY 360 tablet 3  . levocetirizine (XYZAL) 5 MG tablet Take 10 mg by mouth at bedtime.    . Multiple Vitamin (MULTIVITAMIN) tablet Take 1 tablet by mouth daily after lunch.     . simvastatin (ZOCOR) 10 MG tablet TAKE 1 TABLET BY MOUTH EVERYDAY AT BEDTIME 90 tablet 1   No facility-administered medications prior to visit.    PAST MEDICAL HISTORY: Past Medical History:  Diagnosis Date  . Arthritis   . Cataract 2007   bilateral cat ext   . Cholecystitis   . Complication of anesthesia Feb 21, 2014   atrial fib after cholecystectomy, lasted a few hours, none since  . Dysrhythmia 02-21-2014   after cholecystectomy had atrial fib for a few hours, none since  . GERD (gastroesophageal reflux disease)    Had endoscopy no history of Barrett's  . Hx of nonmelanoma skin cancer   . Unspecified hereditary and idiopathic peripheral neuropathy     PAST SURGICAL HISTORY: Past Surgical History:  Procedure Laterality Date  . ABDOMINAL HYSTERECTOMY  15 yrs ago   complete  . BREAST CYST ASPIRATION    . CHOLECYSTECTOMY N/A 02/21/2014   Procedure: LAPAROSCOPIC CHOLECYSTECTOMY WITH INTRAOPERATIVE CHOLANGIOGRAM;  Surgeon: Doreen Salvage, MD;  Location: [facility];  Service: General;  Laterality: N/A;  . ENDOSCOPIC RETROGRADE CHOLANGIOPANCREATOGRAPHY (ERCP) WITH PROPOFOL N/A 05/29/2014   Procedure: ENDOSCOPIC RETROGRADE CHOLANGIOPANCREATOGRAPHY (ERCP) WITH PROPOFOL;  Surgeon: Arta Silence, MD;  Location: [facility];  Service: Endoscopy;   Laterality: N/A;  . ERCP N/A 02/24/2014   Procedure: ENDOSCOPIC RETROGRADE CHOLANGIOPANCREATOGRAPHY (ERCP);  Surgeon: Arta Silence, MD;  Location: [facility] END[facility]COPY;  Service: Endoscopy;  Laterality: N/A;  . EYE SURGERY Bilateral 2007   both eyes cataracts with lens replacments  . LUMBAR LAMINECTOMY/DECOMPRESSION MICRODISCECTOMY  02/01/2012   Procedure: LUMBAR LAMINECTOMY/DECOMPRESSION MICRODISCECTOMY 2 LEVELS;  Surgeon: Kristeen Miss, MD;  Location: [facility];  Service: Neurosurgery;  Laterality: Left;  Left Lumbar Four-Five Lumbar Five-Sacral One Laminectomies/Foraminotomies/Microscope  . SPYGLASS CHOLANGIOSCOPY N/A 05/29/2014   Procedure: SPYGLASS CHOLANGIOSCOPY;  Surgeon: Arta Silence, MD;  Location: [facility];  Service: Endoscopy;  Laterality: N/A;  . TONSILLECTOMY  age 6    FAMILY HISTORY: Family History  Problem Relation Age of Onset  . Neurodegenerative disease Mother        Mikey Bussing Drager died at 82  .  Alcohol abuse Other   . Heart disease Other     SOCIAL HISTORY: Social History   Socioeconomic History  . Marital status: Widowed    Spouse name: Not on file  . Number of children: 3  . Years of education: 15 years  . Highest education level: Associate degree: occupational, Hotel manager, or vocational program  Occupational History  . Occupation: Retired    Comment: Therapist, sports  Tobacco Use  . Smoking status: Former Smoker    Packs/day: 0.25    Years: 7.00    Pack years: 1.75    Types: Cigarettes    Quit date: 04/27/1963    Years since quitting: 56.7  . Smokeless tobacco: Never Used  Substance and Sexual Activity  . Alcohol use: Yes    Alcohol/week: 7.0 standard drinks    Types: 7 Glasses of wine per week    Comment: 1 glass of wine daily  . Drug use: No    Types: Hydrocodone  . Sexual activity: Yes  Other Topics Concern  . 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 Misses working the floor.          Social Determinants of Health   Financial Resource Strain: Low Risk   . Difficulty of Paying Living Expenses: Not hard at all  Food Insecurity: No Food Insecurity  . Worried About Charity fundraiser in the Last Year: Never true  . Ran Out of Food in the Last Year: Never true  Transportation Needs: No Transportation Needs  . Lack of Transportation (Medical): No  . Lack of Transportation (Non-Medical): No  Physical Activity: Inactive  . Days of Exercise per Week: 0 days  . Minutes of Exercise per Session: 0 min  Stress: No Stress Concern Present  . Feeling of Stress : Only a little  Social Connections: Moderately Integrated  . Frequency of Communication with Friends and Family: More than three times a week  . Frequency of Social Gatherings with Friends and Family: Once a week  . Attends Religious Services: More than 4 times per year  . Active Member of Clubs or Organizations: Yes  . Attends Archivist Meetings: More than 4 times per year  . Marital Status: Widowed  Intimate Partner Violence:   . Fear of Current or Ex-Partner: Not on file  . Emotionally Abused: Not on file  . Physically Abused: Not on file  . Sexually Abused: Not on file    PHYSICAL EXAM  Vitals:   01/16/20 1005  BP: 118/70  Pulse: 80  Weight: 123 lb (55.8 kg)   Body mass index is 21.11 kg/m.  Generalized: Well developed, in no acute distress  MMSE - Mini Mental State Exam 01/16/2020 11/13/2018 04/12/2018  Not completed: - - (No Data)  Orientation to time 5 4 4   Orientation to Place 5 5 5   Registration 3 3 3   Attention/ Calculation 5 4 4   Recall 2 3 2   Language- name 2 objects 2 2 2  Language- repeat 1 1 1   Language- follow 3 step command 3 3 3   Language- read & follow direction 1 1 1   Write a sentence 1 1 1   Copy design 1 1 1   Total score 29 28 27     Neurological examination  Mentation: Alert oriented to time, place, history taking. Follows all commands speech and language fluent Cranial nerve II-XII: Pupils were equal round reactive to light. Extraocular movements were full, visual field were full on confrontational test. Facial sensation and strength were normal. Head turning and shoulder shrug were normal and symmetric. Motor: Good strength to all extremities Sensory: Sensory testing is intact to soft touch on all 4 extremities. No evidence of extinction is noted.  Coordination: Cerebellar testing reveals good finger-nose-finger and heel-to-shin bilaterally.  Gait and station: Gait is forward leaning, kyphosis, mild shuffling gait (shoes little big), no assistive device Reflexes: Deep tendon reflexes are symmetric and normal bilaterally.   DIAGNOSTIC DATA (LABS, IMAGING, TESTING) - I reviewed patient records, labs, notes, testing and imaging myself where available.  Lab Results  Component Value Date   WBC 9.5 07/06/2019   HGB 12.3 07/06/2019   HCT 37.5 07/06/2019   MCV 100.5 (H) 07/06/2019   PLT 241 07/06/2019      Component Value Date/Time   NA 136 07/06/2019 1308   NA 133 (A) 07/16/2016 0000   K 4.7 07/06/2019 1308   CL 103 07/06/2019 1308   CO2 25 07/06/2019 1308   GLUCOSE 101 (H) 07/06/2019 1308   BUN 14 07/06/2019 1308   BUN 16 07/16/2016 0000   CREATININE 0.59 07/06/2019 1308   CALCIUM 8.6 (L) 07/06/2019 1308   PROT 6.6 07/06/2019 1308   ALBUMIN 3.8 07/06/2019 1308   AST 33 07/06/2019 1308   ALT 16 07/06/2019 1308   ALKPHOS 55 07/06/2019 1308   BILITOT 0.9 07/06/2019 1308   GFRNONAA >60 07/06/2019 1308   GFRAA >60 07/06/2019 1308   Lab Results  Component Value Date   CHOL 143 05/30/2019   HDL 62.20 05/30/2019   LDLCALC 57  05/30/2019   LDLDIRECT 153 02/12/2009   TRIG 117.0 05/30/2019   CHOLHDL 2 05/30/2019   Lab Results  Component Value Date   HGBA1C 5.6 02/18/2016   Lab Results  Component Value Date   [id number] 829 10/15/2015   Lab Results  Component Value Date   TSH 0.89 07/16/2016      ASSESSMENT AND PLAN 86 y.o. year old female  has a past medical history of Arthritis, Cataract (2007), Cholecystitis, Complication of anesthesia (Feb 21, 2014), Dysrhythmia (02-21-2014), GERD (gastroesophageal reflux disease), nonmelanoma skin cancer, and Unspecified hereditary and idiopathic peripheral neuropathy. here with:  1.  Chronic low back pain, lumbar radiculopathy, mild gait abnormality, mild foot numbness -Previously cannot tolerate Cymbalta or Lyrica -Was seeing Dr Brien Few for pain management -Continue gabapentin 600 mg, 4 times daily -Condition is overall stable, she continues to live alone and drive a car  2.  Abnormal MRI of the brain, evidence of mild supratentorium small vessel disease 2017 -Continue aspirin 81 mg daily  3.  Mild cognitive impairment -MMSE 29/30 today -Previously cannot tolerate Namenda -Follow-up in 1 year or sooner if needed  I spent 20 minutes of face-to-face and non-face-to-face time with patient.  This included previsit chart review, lab review, study review, order entry, electronic health record documentation, patient education.  Butler Denmark, AGNP-C, DNP 01/16/2020, 10:30 AM [facility] [address] [place], [place] [address] [phone]

## 2020-01-16 NOTE — Patient Instructions (Signed)
Continue current medications, gabapentin at current dose Memory score looks good today 29/30 See you back in 1 year or sooner if needed

## 2020-01-21 DIAGNOSIS — Z23 Encounter for immunization: Secondary | ICD-10-CM | POA: Diagnosis not present

## 2020-02-05 NOTE — Progress Notes (Signed)
Chief Complaint  Patient presents with  . Annual Exam    HPI: Susan Ford 86 y.o. comes in today for med eval and  Annual   She is 86 y.o. year old female  has a past medical history of Arthritis, Cataract (2007), Cholecystitis, Complication of anesthesia (Feb 21, 2014), Dysrhythmia (02-21-2014), GERD (gastroesophageal reflux disease), nonmelanoma skin cancer, and Unspecified hereditary and idiopathic peripheral neuropathy having seen for cognitive decline   Stable  . To be seen next year  On no meds didn't tolerated namenda  Lives alone has pruritis still and hx of elevated eos  Was scratching   And had blood in lower back area  This am  No fever pain  injury HRT helps her mood and   Sleep  Back situation is the most  Difficult and no longer taking walks so much  Has seen  Dr Brien Few in past taking celebrex and gabapentin  Health Maintenance  Topic Date Due  . TETANUS/TDAP  11/07/2021  . INFLUENZA VACCINE  Completed  . DEXA SCAN  Completed  . COVID-19 Vaccine  Completed  . PNA vac Low Risk Adult  Completed   Health Maintenance Review LIFESTYLE:  Exercise:   Back and legs  p roblem  No longer walking a lot.    Tobacco/ETS:n Alcohol: glass wine before dinner  Sugar beverages: Sleep:  7  Drug use: no HH:  1     Hearing: ok  Vision:  No limitations at present . Last eye check UTD  Safety:  Has smoke detector and wears seat belts.  No excess sun exposure. Sees dentist regularly.  Falls: no  Memory:see neuro  eval stable   Depression: No anhedonia unusual crying or depressive symptoms  Nutrition: Eats well balanced diet; adequate calcium and vitamin D. No swallowing chewing problems.  Injury: no major injuries in the last six months.  Other healthcare providers:  Reviewed today   Preventive parameters  Reviewed   ADLS:   There are no problems or need for assistance  driving, feeding, obtaining food, dressing, toileting and bathing, managing money using phone.  She is independent. But having mobility issues with her back arthritis     ROS:  GEN/ HEENT: No fever, significant weight changes sweats headaches vision problems hearing changes, CV/ PULM; No chest pain shortness of breath cough, syncope,edema  change in exercise tolerance. GI /GU: No adominal pain, vomiting, change in bowel habits. No blood in the stool. No significant GU symptoms. SKIN/HEME: ,no acute skin rashes suspicious lesions or bleeding. No lymphadenopathy, nodules, masses.  NEURO/ PSYCH:  No neurologic signs such as weakness numbness. No depression anxiety. IMM/ Allergy: No unusual infections.  Allergy .   REST of 12 system review negative except as per HPI   Past Medical History:  Diagnosis Date  . Arthritis   . Cataract 2007   bilateral cat ext   . Cholecystitis   . Complication of anesthesia Feb 21, 2014   atrial fib after cholecystectomy, lasted a few hours, none since  . Dysrhythmia 02-21-2014   after cholecystectomy had atrial fib for a few hours, none since  . GERD (gastroesophageal reflux disease)    Had endoscopy no history of Barrett's  . Hx of nonmelanoma skin cancer   . Unspecified hereditary and idiopathic peripheral neuropathy     Family History  Problem Relation Age of Onset  . Neurodegenerative disease Mother        Mikey Bussing Drager died at 29  . Alcohol  abuse Other   . Heart disease Other     Social History   Socioeconomic History  . Marital status: Widowed    Spouse name: Not on file  . Number of children: 3  . Years of education: 15 years  . Highest education level: Associate degree: occupational, Hotel manager, or vocational program  Occupational History  . Occupation: Retired    Comment: Therapist, sports  Tobacco Use  . Smoking status: Former Smoker    Packs/day: 0.25    Years: 7.00    Pack years: 1.75    Types: Cigarettes    Quit date: 04/27/1963    Years since quitting: 56.8  . Smokeless tobacco: Never Used  Substance and Sexual Activity  . Alcohol  use: Yes    Alcohol/week: 7.0 standard drinks    Types: 7 Glasses of wine per week    Comment: 1 glass of wine daily  . Drug use: No    Types: Hydrocodone  . Sexual activity: Yes  Other Topics Concern  . 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 Misses working the floor.          Social Determinants of Health   Financial Resource Strain: Low Risk   . Difficulty of Paying Living Expenses: Not hard at all  Food Insecurity: No Food Insecurity  . Worried About Charity fundraiser in the Last Year: Never true  . Ran Out of Food in the Last Year: Never true  Transportation Needs: No Transportation Needs  . Lack of Transportation (Medical): No  . Lack of Transportation (Non-Medical): No  Physical Activity: Inactive  . Days of Exercise per Week: 0 days  . Minutes of Exercise per Session: 0 min  Stress: No Stress Concern Present  . Feeling of Stress : Only a little  Social Connections: Moderately Integrated  . Frequency of Communication with Friends and Family: More than three times a week  . Frequency of Social Gatherings with Friends and Family: Once a week  . Attends Religious Services: More than 4 times per year  . Active Member of Clubs or Organizations: Yes  . Attends Archivist Meetings: More than 4 times per year  . Marital Status: Widowed    Outpatient Encounter Medications as of 02/06/2020  Medication Sig  . acetaminophen (TYLENOL) 500 MG tablet Take 500 mg by mouth every 6 (six) hours as needed for mild pain.   Marland Kitchen ascorbic acid (VITAMIN C) 1000 MG tablet Take 1,000 mg by mouth daily after  lunch.   Marland Kitchen aspirin 81 MG tablet Take 81 mg by mouth daily after lunch.   . celecoxib (CELEBREX) 200 MG capsule Take 1 capsule (200 mg total) by mouth daily.  Marland Kitchen estradiol (CLIMARA - DOSED IN MG/24 HR) 0.025 mg/24hr patch Place 1 patch (0.025 mg total) onto the skin once a week. Place 1 patch on once a week on Saturday  . famotidine (PEPCID) 20 MG tablet Take 20 mg by mouth daily as needed for  heartburn or indigestion.   . gabapentin (NEURONTIN) 600 MG tablet TAKE 1 TABLET BY MOUTH 4 TIMES DAILY  . levocetirizine (XYZAL) 5 MG tablet Take 10 mg by mouth at bedtime.  . Lidocaine HCl-Benzyl Alcohol (SALONPAS LIDOCAINE PLUS) 4-10 % CREA Apply topically.  . Multiple Vitamin (MULTIVITAMIN) tablet Take 1 tablet by mouth daily after lunch.   . simvastatin (ZOCOR) 10 MG tablet TAKE 1 TABLET BY MOUTH EVERYDAY AT BEDTIME   No facility-administered encounter medications on file as of 02/06/2020.    EXAM:  BP 130/70   Pulse 79   Temp 98.6 F (37 C) (Oral)   Ht 5\' 1"  (1.549 m)   Wt 122 lb 9.6 oz (55.6 kg)   SpO2 94%   BMI 23.17 kg/m   Body mass index is 23.17 kg/m.  Physical Exam: Vital signs reviewed IRS:WNIO is a well-developed well-nourished alert cooperative   who appears stated age in no acute distress.  HEENT: normocephalic atraumatic , Eyes: PERRL EOM's full, conjunctiva clear, Nares: paten,t no deformity discharge or tenderness., Ears: no deformity EAC's clear TMs with normal landmarks. Mouth: masked . NECK: supple without masses, thyromegaly or bruits. CHEST/PULM:  Clear to auscultation and percussion breath sounds equal no wheeze , rales or rhonchi. No chest wall deformities or tenderness. Some kyphosis  CV: PMI is nondisplaced, S1 S2 no gallops, murmurs, rubs. Peripheral pulses are full without delay.No JVD .   Cool feet after  Socks off for a wyile ABDOMEN: Bowel sounds normal nontender  No guard or rebound, no hepato splenomegal no CVA tenderness.   Extremtities:  No clubbing  cyanosis or edema, no acute joint swelling or redness no focal atrophy  noulcers   NEURO:  Oriented x3, cranial nerves 3-12 appear to be intact, no obvious focal weakness,gait within normal limits no abnormal reflexes or asymmetrical SKIN: basck left  Large 3 cm  Blood blister like  Lesion  With blood and scratches  No lesion? underling  normal turgor, color, no bruising or petechiae. No ulcers   PSYCH: Oriented, good eye contact, no obvious depression anxiety, cognition and judgment appear normal. LN: no cervical axillary inguinal adenopathy No noted deficits in  attention, and speech.     Lab Results  Component Value Date   WBC 9.5 07/06/2019   HGB 12.3 07/06/2019   HCT 37.5 07/06/2019   PLT 241 07/06/2019   GLUCOSE 101 (H) 07/06/2019   CHOL 143 05/30/2019   TRIG 117.0 05/30/2019   HDL 62.20 05/30/2019   LDLDIRECT 153 02/12/2009   LDLCALC 57 05/30/2019   ALT 16 07/06/2019   AST 33 07/06/2019   NA 136 07/06/2019   K 4.7 07/06/2019   CL 103 07/06/2019   CREATININE 0.59 07/06/2019   BUN 14 07/06/2019   CO2 25 07/06/2019   TSH 0.89 07/16/2016   INR 0.91 07/05/2017   HGBA1C 5.6 02/18/2016    ASSESSMENT AND PLAN:  Discussed the following assessment and plan:  Medication management - Plan: CBC with Differential/Platelet, BASIC METABOLIC PANEL WITH GFR, Lipid panel, Hepatic function panel, Hemoglobin A1c, Hemoglobin A1c, Hepatic function panel, Lipid panel, BASIC METABOLIC PANEL WITH GFR, CBC with Differential/Platelet  Hyperlipidemia, unspecified hyperlipidemia type - Plan: CBC with Differential/Platelet, BASIC METABOLIC PANEL WITH GFR, Lipid panel, Hepatic function panel, Hemoglobin A1c, Hemoglobin A1c, Hepatic function panel, Lipid panel, BASIC METABOLIC PANEL WITH GFR, CBC with Differential/Platelet  Hereditary and idiopathic peripheral neuropathy - Plan: CBC with Differential/Platelet, BASIC METABOLIC PANEL WITH GFR, Lipid panel, Hepatic function panel, Hemoglobin  A1c,  Hemoglobin A1c, Hepatic function panel, Lipid panel, BASIC METABOLIC PANEL WITH GFR, CBC with Differential/Platelet  Current use of estrogen therapy - Plan: CBC with Differential/Platelet, BASIC METABOLIC PANEL WITH GFR, Lipid panel, Hepatic function panel, Hemoglobin A1c, Hemoglobin A1c, Hepatic function panel, Lipid panel, BASIC METABOLIC PANEL WITH GFR, CBC with Differential/Platelet  Mild cognitive impairment - Plan: CBC with Differential/Platelet, BASIC METABOLIC PANEL WITH GFR, Lipid panel, Hepatic function panel, Hemoglobin A1c, Hemoglobin A1c, Hepatic function panel, Lipid panel, BASIC METABOLIC PANEL WITH GFR, CBC with Differential/Platelet  Abrasion of skin - Plan: CBC with Differential/Platelet, BASIC METABOLIC PANEL WITH GFR, Lipid panel, Hepatic function panel, Hemoglobin A1c, Hemoglobin A1c, Hepatic function panel, Lipid panel, BASIC METABOLIC PANEL WITH GFR, CBC with Differential/Platelet  Itching - Plan: CBC with Differential/Platelet, BASIC METABOLIC PANEL WITH GFR, Lipid panel, Hepatic function panel, Hemoglobin A1c, Hemoglobin A1c, Hepatic function panel, Lipid panel, BASIC METABOLIC PANEL WITH GFR, CBC with Differential/Platelet  Spinal stenosis of lumbar region, unspecified whether neurogenic claudication present  Hormone replacement therapy (HRT) ? Friction  scratching  Blister vs thermal?  seems supercial  Local care and  Then  Fu  If  persistent or progressive and not healing      tegaderm  lical and stop scratching  Go off asa  Risk more than benefit see below  Lab today   Patient Care Team: Leita Lindbloom, Standley Brooking, MD as PCP - General Martinique, Amy, MD as Consulting Physician (Dermatology) Marcial Pacas, MD as Consulting Physician (Neurology) Kristeen Miss, MD as Consulting Physician (Neurosurgery) Marlaine Hind, MD as Consulting Physician (Physical Medicine and Rehabilitation) Jola Baptist, [place] as Referring Physician (Chiropractic Medicine) Webb Laws, Branch as Referring  Physician (Optometry) Jola Baptist, DC as Referring Physician (Chiropractic Medicine) Suzzanne Cloud, NP as Nurse Practitioner (Neurology)  Patient Instructions  Stop the aspirin  Risk seems more than benefit.  consdier stopping the vit C  Unless  As per eye doctor .    Plain multivitamin is  Good enough   Dont scratch area on back .    Can use cfreams or moisturizers on unbroken skin to help  Cleanse with a non soap such as dove or cetaphil .  To avoid dry skin that itches .    Plan recheck  Abrasion area in  2-3 weeks  .  Will notify you  of labs when available.     Standley Brooking. Jayde Mcallister M.D.

## 2020-02-06 ENCOUNTER — Ambulatory Visit (INDEPENDENT_AMBULATORY_CARE_PROVIDER_SITE_OTHER): Payer: Medicare Other | Admitting: Internal Medicine

## 2020-02-06 ENCOUNTER — Encounter: Payer: Self-pay | Admitting: Internal Medicine

## 2020-02-06 ENCOUNTER — Other Ambulatory Visit: Payer: Self-pay

## 2020-02-06 VITALS — BP 130/70 | HR 79 | Temp 98.6°F | Ht 61.0 in | Wt 122.6 lb

## 2020-02-06 DIAGNOSIS — M48061 Spinal stenosis, lumbar region without neurogenic claudication: Secondary | ICD-10-CM

## 2020-02-06 DIAGNOSIS — T148XXA Other injury of unspecified body region, initial encounter: Secondary | ICD-10-CM

## 2020-02-06 DIAGNOSIS — G3184 Mild cognitive impairment, so stated: Secondary | ICD-10-CM

## 2020-02-06 DIAGNOSIS — G609 Hereditary and idiopathic neuropathy, unspecified: Secondary | ICD-10-CM

## 2020-02-06 DIAGNOSIS — E785 Hyperlipidemia, unspecified: Secondary | ICD-10-CM | POA: Diagnosis not present

## 2020-02-06 DIAGNOSIS — Z0000 Encounter for general adult medical examination without abnormal findings: Secondary | ICD-10-CM

## 2020-02-06 DIAGNOSIS — L299 Pruritus, unspecified: Secondary | ICD-10-CM

## 2020-02-06 DIAGNOSIS — Z79890 Hormone replacement therapy (postmenopausal): Secondary | ICD-10-CM

## 2020-02-06 DIAGNOSIS — Z79899 Other long term (current) drug therapy: Secondary | ICD-10-CM

## 2020-02-06 NOTE — Patient Instructions (Addendum)
Stop the aspirin  Risk seems more than benefit.  consdier stopping the vit C  Unless  As per eye doctor .    Plain multivitamin is  Good enough   Dont scratch area on back .    Can use cfreams or moisturizers on unbroken skin to help  Cleanse with a non soap such as dove or cetaphil .  To avoid dry skin that itches .    Plan recheck  Abrasion area in  2-3 weeks  .  Will notify you  of labs when available.

## 2020-02-07 LAB — BASIC METABOLIC PANEL WITH GFR
BUN: 10 mg/dL (ref 7–25)
CO2: 25 mmol/L (ref 20–32)
Calcium: 8.9 mg/dL (ref 8.6–10.4)
Chloride: 100 mmol/L (ref 98–110)
Creat: 0.62 mg/dL (ref 0.60–0.88)
GFR, Est African American: 93 mL/min/{1.73_m2} (ref >=60)
GFR, Est Non African American: 81 mL/min/{1.73_m2} (ref >=60)
Glucose, Bld: 99 mg/dL (ref 65–99)
Potassium: 4.5 mmol/L (ref 3.5–5.3)
Sodium: 134 mmol/L — ABNORMAL LOW (ref 135–146)

## 2020-02-07 LAB — HEPATIC FUNCTION PANEL
AG Ratio: 1.8 (calc) (ref 1.0–2.5)
ALT: 10 U/L (ref 6–29)
AST: 17 U/L (ref 10–35)
Albumin: 4.1 g/dL (ref 3.6–5.1)
Alkaline phosphatase (APISO): 58 U/L (ref 37–153)
Bilirubin, Direct: 0.1 mg/dL (ref 0.0–0.2)
Globulin: 2.3 g/dL (calc) (ref 1.9–3.7)
Indirect Bilirubin: 0.6 mg/dL (calc) (ref 0.2–1.2)
Total Bilirubin: 0.7 mg/dL (ref 0.2–1.2)
Total Protein: 6.4 g/dL (ref 6.1–8.1)

## 2020-02-07 LAB — CBC WITH DIFFERENTIAL/PLATELET
Absolute Monocytes: 540 cells/uL (ref 200–950)
Basophils Absolute: 111 cells/uL (ref 0–200)
Basophils Relative: 1.5 %
Eosinophils Absolute: 940 cells/uL — ABNORMAL HIGH (ref 15–500)
Eosinophils Relative: 12.7 %
HCT: 40 % (ref 35.0–45.0)
Hemoglobin: 13.4 g/dL (ref 11.7–15.5)
Lymphs Abs: 1665 cells/uL (ref 850–3900)
MCH: 32.4 pg (ref 27.0–33.0)
MCHC: 33.5 g/dL (ref 32.0–36.0)
MCV: 96.6 fL (ref 80.0–100.0)
MPV: 10.1 fL (ref 7.5–12.5)
Monocytes Relative: 7.3 %
Neutro Abs: 4144 cells/uL (ref 1500–7800)
Neutrophils Relative %: 56 %
Platelets: 260 10*3/uL (ref 140–400)
RBC: 4.14 10*6/uL (ref 3.80–5.10)
RDW: 11.5 % (ref 11.0–15.0)
Total Lymphocyte: 22.5 %
WBC: 7.4 10*3/uL (ref 3.8–10.8)

## 2020-02-07 LAB — LIPID PANEL
Cholesterol: 167 mg/dL (ref <200)
HDL: 71 mg/dL (ref >=50)
LDL Cholesterol (Calc): 72 mg/dL (calc)
Non-HDL Cholesterol (Calc): 96 mg/dL (calc) (ref <130)
Total CHOL/HDL Ratio: 2.4 (calc) (ref <5.0)
Triglycerides: 153 mg/dL — ABNORMAL HIGH (ref <150)

## 2020-02-07 LAB — HEMOGLOBIN A1C
Hgb A1c MFr Bld: 5.4 % of total Hgb (ref <5.7)
Mean Plasma Glucose: 108 (calc)
eAG (mmol/L): 6 (calc)

## 2020-02-07 NOTE — Progress Notes (Signed)
Results are stable  no diabetes  LDL cholestrol at goal  borderline elevated TG  Blood count shows elevated eosinophils as in past seems related he itching you have     will follow  Please arrange   cbcdiff and BMP in 6 months   dx  med monitoring and elevated eosinophils  no ov needed if all ok .

## 2020-02-12 ENCOUNTER — Other Ambulatory Visit: Payer: Self-pay

## 2020-02-12 ENCOUNTER — Encounter: Payer: Self-pay | Admitting: Internal Medicine

## 2020-02-12 DIAGNOSIS — R898 Other abnormal findings in specimens from other organs, systems and tissues: Secondary | ICD-10-CM

## 2020-02-12 DIAGNOSIS — Z5181 Encounter for therapeutic drug level monitoring: Secondary | ICD-10-CM

## 2020-03-10 ENCOUNTER — Telehealth: Payer: Self-pay | Admitting: Neurology

## 2020-03-10 NOTE — Telephone Encounter (Signed)
Susan Ford is new drug for memory loss, (MDs from [place]) asking what she thinks.  She feels like her memory is declining.  Last seen 9-21/21.

## 2020-03-10 NOTE — Telephone Encounter (Signed)
I am not familiar with the product, but looks like supplement for memory. Has B12, Magnesium, curcuma longa root. I am not sure how helpful it would be for her, but may be reasonable to try if she wants to. Could not tolerate namenda, I don't see we have tried Aricept, MMSE was good 29/30 at last visit.

## 2020-03-10 NOTE — Telephone Encounter (Signed)
Pt called, would like to have a prescription for memory loss. We discussed at last appt. Would like a call from the nurse.

## 2020-03-11 MED ORDER — DONEPEZIL HCL 5 MG PO TABS: 5.0000 mg | ORAL_TABLET | Freq: Every day | ORAL | 3 refills | Status: DC

## 2020-03-11 NOTE — Telephone Encounter (Signed)
Spoke to pt and relayed the information about the ceremin.  She then decided to go with aricept if that is option for her.  She does not recall trying this previously.  [facility] [address]

## 2020-03-11 NOTE — Telephone Encounter (Signed)
I'll send in Aricept 5 mg to start at bedtime, full dose if 10 mg if she does well after several weeks. Watch out for diarrhea, dreams, weight loss.

## 2020-03-11 NOTE — Telephone Encounter (Signed)
I called pt and relayed that aricept 5mg  po qhs after 2 wks if tolerates then increase to 10mg  po qhs.  Look out for diarrhea, dreams, weight loss. She stated she will see how she does with 5 mg and if feels a difference will stay on that dose.   I relayed that maintenance dose is 10mg  po qhs.  She will call back as needed.

## 2020-03-11 NOTE — Addendum Note (Signed)
Addended by: Suzzanne Cloud on: 03/11/2020 02:14 PM   Modules accepted: Orders

## 2020-03-17 NOTE — Progress Notes (Signed)
I have reviewed and agreed above plan. 

## 2020-05-19 ENCOUNTER — Telehealth: Payer: Self-pay | Admitting: Neurology

## 2020-05-19 NOTE — Telephone Encounter (Signed)
Pt called stating that she is needing to discuss with the RN possibly increasing the dosage on her gabapentin (NEURONTIN) 600 MG tablet Please advise.

## 2020-05-19 NOTE — Telephone Encounter (Signed)
I called the patient, having more leg pain with walking, this is same pain, no new reason. We talked about options, fortunately the pain subsides if she will sit down with activity. We could increase, but will keep at same dose for now gabapentin 600 mg 4 times daily we decided.

## 2020-05-19 NOTE — Telephone Encounter (Signed)
She is having worsening leg pain and the gabapentin 600mg  po QID is not  alleviating this like it was.  Can she increase dose:  (her daughter gave her 300mg  caps if she can use those).  Please advise.   Same pharmacy and insurance.

## 2020-05-26 ENCOUNTER — Other Ambulatory Visit: Payer: Self-pay | Admitting: Internal Medicine

## 2020-05-30 ENCOUNTER — Other Ambulatory Visit: Payer: Self-pay

## 2020-05-30 ENCOUNTER — Ambulatory Visit (INDEPENDENT_AMBULATORY_CARE_PROVIDER_SITE_OTHER): Payer: Medicare Other

## 2020-05-30 VITALS — BP 116/62 | HR 61 | Temp 98.1°F | Ht 61.0 in | Wt 122.6 lb

## 2020-05-30 DIAGNOSIS — Z0000 Encounter for general adult medical examination without abnormal findings: Secondary | ICD-10-CM

## 2020-05-30 NOTE — Patient Instructions (Signed)
Susan Ford , Thank you for taking time to come for your Medicare Wellness Visit. I appreciate your ongoing commitment to your health goals. Please review the following plan we discussed and let me know if I can assist you in the future.   Screening recommendations/referrals: Colonoscopy: No longer required  Mammogram: No longer required  Bone Density: No longer required  Recommended yearly ophthalmology/optometry visit for glaucoma screening and checkup Recommended yearly dental visit for hygiene and checkup  Vaccinations: Influenza vaccine: Up to date, next due fall 2022  Pneumococcal vaccine: Completed series  Tdap vaccine: up to date, next due 11/07/2021 Shingles vaccine: Currently due for Shingrix, if you wish to receive we recommend you receive at you pharmacy.    Advanced directives: Please bring copies of your advanced medical directives to our office so that we may scan into your chart.   Conditions/risks identified: None   Next appointment: 06/03/2021 @ 10:30 am with [place] 65 Years and Older, Female Preventive care refers to lifestyle choices and visits with your health care provider that can promote health and wellness. What does preventive care include?  A yearly physical exam. This is also called an annual well check.  Dental exams once or twice a year.  Routine eye exams. Ask your health care provider how often you should have your eyes checked.  Personal lifestyle choices, including:  Daily care of your teeth and gums.  Regular physical activity.  Eating a healthy diet.  Avoiding tobacco and drug use.  Limiting alcohol use.  Practicing safe sex.  Taking low-dose aspirin every day.  Taking vitamin and mineral supplements as recommended by your health care provider. What happens during an annual well check? The services and screenings done by your health care provider during your annual well check will depend on your  age, overall health, lifestyle risk factors, and family history of disease. Counseling  Your health care provider may ask you questions about your:  Alcohol use.  Tobacco use.  Drug use.  Emotional well-being.  Home and relationship well-being.  Sexual activity.  Eating habits.  History of falls.  Memory and ability to understand (cognition).  Work and work Statistician.  Reproductive health. Screening  You may have the following tests or measurements:  Height, weight, and BMI.  Blood pressure.  Lipid and cholesterol levels. These may be checked every 5 years, or more frequently if you are over 77 years old.  Skin check.  Lung cancer screening. You may have this screening every year starting at age 64 if you have a 30-pack-year history of smoking and currently smoke or have quit within the past 15 years.  Fecal occult blood test (FOBT) of the stool. You may have this test every year starting at age 39.  Flexible sigmoidoscopy or colonoscopy. You may have a sigmoidoscopy every 5 years or a colonoscopy every 10 years starting at age 49.  Hepatitis C blood test.  Hepatitis B blood test.  Sexually transmitted disease (STD) testing.  Diabetes screening. This is done by checking your blood sugar (glucose) after you have not eaten for a while (fasting). You may have this done every 1-3 years.  Bone density scan. This is done to screen for osteoporosis. You may have this done starting at age 33.  Mammogram. This may be done every 1-2 years. Talk to your health care provider about how often you should have regular mammograms. Talk with your health care provider about your test  results, treatment options, and if necessary, the need for more tests. Vaccines  Your health care provider may recommend certain vaccines, such as:  Influenza vaccine. This is recommended every year.  Tetanus, diphtheria, and acellular pertussis (Tdap, Td) vaccine. You may need a Td booster  every 10 years.  Zoster vaccine. You may need this after age 27.  Pneumococcal 13-valent conjugate (PCV13) vaccine. One dose is recommended after age 65.  Pneumococcal polysaccharide (PPSV23) vaccine. One dose is recommended after age 30. Talk to your health care provider about which screenings and vaccines you need and how often you need them. This information is not intended to replace advice given to you by your health care provider. Make sure you discuss any questions you have with your health care provider. Document Released: 05/09/2015 Document Revised: 12/31/2015 Document Reviewed: 02/11/2015 Elsevier Interactive Patient Education  2017 [place] Prevention in the Home Falls can cause injuries. They can happen to people of all ages. There are many things you can do to make your home safe and to help prevent falls. What can I do on the outside of my home?  Regularly fix the edges of walkways and driveways and fix any cracks.  Remove anything that might make you trip as you walk through a door, such as a raised step or threshold.  Trim any bushes or trees on the path to your home.  Use bright outdoor lighting.  Clear any walking paths of anything that might make someone trip, such as rocks or tools.  Regularly check to see if handrails are loose or broken. Make sure that both sides of any steps have handrails.  Any raised decks and porches should have guardrails on the edges.  Have any leaves, snow, or ice cleared regularly.  Use sand or salt on walking paths during winter.  Clean up any spills in your garage right away. This includes oil or grease spills. What can I do in the bathroom?  Use night lights.  Install grab bars by the toilet and in the tub and shower. Do not use towel bars as grab bars.  Use non-skid mats or decals in the tub or shower.  If you need to sit down in the shower, use a plastic, non-slip stool.  Keep the floor dry. Clean up any  water that spills on the floor as soon as it happens.  Remove soap buildup in the tub or shower regularly.  Attach bath mats securely with double-sided non-slip rug tape.  Do not have throw rugs and other things on the floor that can make you trip. What can I do in the bedroom?  Use night lights.  Make sure that you have a light by your bed that is easy to reach.  Do not use any sheets or blankets that are too big for your bed. They should not hang down onto the floor.  Have a firm chair that has side arms. You can use this for support while you get dressed.  Do not have throw rugs and other things on the floor that can make you trip. What can I do in the kitchen?  Clean up any spills right away.  Avoid walking on wet floors.  Keep items that you use a lot in easy-to-reach places.  If you need to reach something above you, use a strong step stool that has a grab bar.  Keep electrical cords out of the way.  Do not use floor polish or wax that makes  floors slippery. If you must use wax, use non-skid floor wax.  Do not have throw rugs and other things on the floor that can make you trip. What can I do with my stairs?  Do not leave any items on the stairs.  Make sure that there are handrails on both sides of the stairs and use them. Fix handrails that are broken or loose. Make sure that handrails are as long as the stairways.  Check any carpeting to make sure that it is firmly attached to the stairs. Fix any carpet that is loose or worn.  Avoid having throw rugs at the top or bottom of the stairs. If you do have throw rugs, attach them to the floor with carpet tape.  Make sure that you have a light switch at the top of the stairs and the bottom of the stairs. If you do not have them, ask someone to add them for you. What else can I do to help prevent falls?  Wear shoes that:  Do not have high heels.  Have rubber bottoms.  Are comfortable and fit you well.  Are closed  at the toe. Do not wear sandals.  If you use a stepladder:  Make sure that it is fully opened. Do not climb a closed stepladder.  Make sure that both sides of the stepladder are locked into place.  Ask someone to hold it for you, if possible.  Clearly mark and make sure that you can see:  Any grab bars or handrails.  First and last steps.  Where the edge of each step is.  Use tools that help you move around (mobility aids) if they are needed. These include:  Canes.  Walkers.  Scooters.  Crutches.  Turn on the lights when you go into a dark area. Replace any light bulbs as soon as they burn out.  Set up your furniture so you have a clear path. Avoid moving your furniture around.  If any of your floors are uneven, fix them.  If there are any pets around you, be aware of where they are.  Review your medicines with your doctor. Some medicines can make you feel dizzy. This can increase your chance of falling. Ask your doctor what other things that you can do to help prevent falls. This information is not intended to replace advice given to you by your health care provider. Make sure you discuss any questions you have with your health care provider. Document Released: 02/06/2009 Document Revised: 09/18/2015 Document Reviewed: 05/17/2014 Elsevier Interactive Patient Education  2017 [employer].

## 2020-05-30 NOTE — Progress Notes (Signed)
Subjective:   Susan Ford is a 87 y.o. female who presents for Medicare Annual (Subsequent) preventive examination.  Review of Systems    N/A  Cardiac Risk Factors include: advanced age (>15men, >40 women)     Objective:    Today's Vitals   05/30/20 1044 05/30/20 1045  BP: 116/62   Pulse: 61   Temp: 98.1 F (36.7 C)   TempSrc: Oral   SpO2: 96%   Weight: 122 lb 9 oz (55.6 kg)   Height: 5\' 1"  (1.549 m)   PainSc:  8    Body mass index is 23.16 kg/m.  Advanced Directives 05/30/2020 07/06/2019 05/18/2019 05/12/2018 07/05/2017 05/11/2017 05/11/2017  Does Patient Have a Medical Advance Directive? Yes Yes Yes Yes - Yes Yes  Type of Advance Directive [place];Living will Living will [place];Living will [place];Living will Living will - -  Does patient want to make changes to medical advance directive? No - Patient declined - No - Patient declined - - - -  Copy of [place] in Chart? No - copy requested - No - copy requested No - copy requested - - -    Current Medications (verified) Outpatient Encounter Medications as of 05/30/2020  Medication Sig  . acetaminophen (TYLENOL) 500 MG tablet Take 500 mg by mouth every 6 (six) hours as needed for mild pain.   Marland Kitchen ascorbic acid (VITAMIN C) 1000 MG tablet Take 1,000 mg by mouth daily after lunch.   Marland Kitchen aspirin 81 MG tablet Take 81 mg by mouth daily after lunch.  . celecoxib (CELEBREX) 200 MG capsule Take 1 capsule (200 mg total) by mouth daily.  Marland Kitchen estradiol (CLIMARA - DOSED IN MG/24 HR) 0.025 mg/24hr patch PLACE 1 PATCH (0.025 MG TOTAL) ONTO THE SKIN ONCE A WEEK. PLACE 1 PATCH ON ONCE A WEEK ON SATURDAY  . gabapentin (NEURONTIN) 600 MG tablet TAKE 1 TABLET BY MOUTH 4 TIMES DAILY  . levocetirizine (XYZAL) 5 MG tablet Take 10 mg by mouth at bedtime.  . Lidocaine HCl-Benzyl Alcohol (SALONPAS LIDOCAINE PLUS) 4-10 % CREA Apply topically.  . Multiple Vitamin  (MULTIVITAMIN) tablet Take 1 tablet by mouth daily after lunch.  . simvastatin (ZOCOR) 10 MG tablet TAKE 1 TABLET BY MOUTH EVERYDAY AT BEDTIME  . donepezil (ARICEPT) 5 MG tablet Take 1 tablet (5 mg total) by mouth at bedtime. (Patient not taking: Reported on 05/30/2020)  . famotidine (PEPCID) 20 MG tablet Take 20 mg by mouth daily as needed for heartburn or indigestion.  (Patient not taking: Reported on 05/30/2020)   No facility-administered encounter medications on file as of 05/30/2020.    Allergies (verified) Keflex [cephalexin], Lyrica [pregabalin], Oxytetracycline, and Memantine   History: Past Medical History:  Diagnosis Date  . Arthritis   . Cataract 2007   bilateral cat ext   . Cholecystitis   . Complication of anesthesia Feb 21, 2014   atrial fib after cholecystectomy, lasted a few hours, none since  . Dysrhythmia 02-21-2014   after cholecystectomy had atrial fib for a few hours, none since  . GERD (gastroesophageal reflux disease)    Had endoscopy no history of Barrett's  . Hx of nonmelanoma skin cancer   . Unspecified hereditary and idiopathic peripheral neuropathy    Past Surgical History:  Procedure Laterality Date  . ABDOMINAL HYSTERECTOMY  15 yrs ago   complete  . BREAST CYST ASPIRATION    . CHOLECYSTECTOMY N/A 02/21/2014   Procedure: LAPAROSCOPIC  CHOLECYSTECTOMY WITH INTRAOPERATIVE CHOLANGIOGRAM;  Surgeon: Doreen Salvage, MD;  Location: [facility];  Service: General;  Laterality: N/A;  . ENDOSCOPIC RETROGRADE CHOLANGIOPANCREATOGRAPHY (ERCP) WITH PROPOFOL N/A 05/29/2014   Procedure: ENDOSCOPIC RETROGRADE CHOLANGIOPANCREATOGRAPHY (ERCP) WITH PROPOFOL;  Surgeon: Arta Silence, MD;  Location: [facility];  Service: Endoscopy;  Laterality: N/A;  . ERCP N/A 02/24/2014   Procedure: ENDOSCOPIC RETROGRADE CHOLANGIOPANCREATOGRAPHY (ERCP);  Surgeon: Arta Silence, MD;  Location: [facility]COPY;  Service: Endoscopy;  Laterality: N/A;  . EYE SURGERY Bilateral 2007   both eyes cataracts with  lens replacments  . LUMBAR LAMINECTOMY/DECOMPRESSION MICRODISCECTOMY  02/01/2012   Procedure: LUMBAR LAMINECTOMY/DECOMPRESSION MICRODISCECTOMY 2 LEVELS;  Surgeon: Kristeen Miss, MD;  Location: [facility];  Service: Neurosurgery;  Laterality: Left;  Left Lumbar Four-Five Lumbar Five-Sacral One Laminectomies/Foraminotomies/Microscope  . SPYGLASS CHOLANGIOSCOPY N/A 05/29/2014   Procedure: SPYGLASS CHOLANGIOSCOPY;  Surgeon: Arta Silence, MD;  Location: [facility] ENDOSCOPY;  Service: Endoscopy;  Laterality: N/A;  . TONSILLECTOMY  age 81   Family History  Problem Relation Age of Onset  . Neurodegenerative disease Mother        Mikey Bussing Drager died at 76  . Alcohol abuse Other   . Heart disease Other    Social History   Socioeconomic History  . Marital status: Widowed    Spouse name: Not on file  . Number of children: 3  . Years of education: 15 years  . Highest education level: Associate degree: occupational, Hotel manager, or vocational program  Occupational History  . Occupation: Retired    Comment: Therapist, sports  Tobacco Use  . Smoking status: Former Smoker    Packs/day: 0.25    Years: 7.00    Pack years: 1.75    Types: Cigarettes    Quit date: 04/27/1963    Years since quitting: 57.1  . Smokeless tobacco: Never Used  Substance and Sexual Activity  . Alcohol use: Yes    Alcohol/week: 7.0 standard drinks    Types: 7 Glasses of wine per week    Comment: 1 glass of wine daily  . Drug use: No    Types: Hydrocodone  . Sexual activity: Yes  Other Topics Concern  . 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 Misses working the floor.           Social Determinants of Health   Financial Resource Strain: Low Risk   . Difficulty of Paying Living Expenses: Not hard at all  Food Insecurity: No Food Insecurity  . Worried About Charity fundraiser in the Last Year: Never true  . Ran Out of Food in the Last Year: Never true  Transportation Needs: No Transportation Needs  . Lack of Transportation (Medical): No  . Lack of Transportation (Non-Medical): No  Physical Activity: Inactive  . Days of Exercise per Week: 0 days  . Minutes of Exercise per Session: 0 min  Stress: No Stress Concern Present  . Feeling of Stress : Not at all  Social Connections: Moderately Isolated  . Frequency of  Communication with Friends and Family: More than three times a week  . Frequency of Social Gatherings with Friends and Family: Three times a week  . Attends Religious Services: More than 4 times per year  . Active Member of Clubs or Organizations: No  . Attends Archivist Meetings: Never  . Marital Status: Widowed    Tobacco Counseling Counseling given: Not Answered   Clinical Intake:  Pre-visit preparation completed: Yes  Pain : 0-10 Pain Score: 8  Pain Type: Neuropathic pain Pain Location: Leg Pain Orientation: Right,Left Pain Descriptors / Indicators: Aching Pain Onset: More than a month ago Pain Frequency: Constant Pain Relieving Factors: Tylenol, Gabapentin  Pain Relieving Factors: Tylenol, Gabapentin  Diabetes: No  How often do you need to have someone help you when you read instructions, pamphlets, or other written materials from your doctor or pharmacy?: 1 - Never What is the last grade level you completed in school?: College  Diabetic?No  Interpreter Needed?: No  Information entered by :: [place] of Daily Living In your present state of health, do you have any difficulty performing the following activities: 05/30/2020  Hearing? N  Vision? N  Difficulty concentrating or making  decisions? Y  Walking or climbing stairs? N  Dressing or bathing? N  Doing errands, shopping? N  Preparing Food and eating ? N  Using the Toilet? N  In the past six months, have you accidently leaked urine? Y  Comment has occassional dribbles with urgency. Wears incontience pads  Do you have problems with loss of bowel control? N  Managing your Medications? N  Managing your Finances? N  Housekeeping or managing your Housekeeping? N  Some recent data might be hidden    Patient Care Team: Panosh, Standley Brooking, MD as PCP - General Martinique, Amy, MD as Consulting Physician (Dermatology) Marcial Pacas, MD as Consulting Physician (Neurology) Kristeen Miss, MD as Consulting Physician (Neurosurgery) Marlaine Hind, MD as Consulting Physician (Physical Medicine and Rehabilitation) Jola Baptist, [place] as Referring Physician (Chiropractic Medicine) Webb Laws, [place] as Referring Physician (Optometry) Jola Baptist, [place] as Referring Physician (Chiropractic Medicine) Suzzanne Cloud, NP as Nurse Practitioner (Neurology)  Indicate any recent Medical Services you may have received from other than [facility] providers in the past year (date may be approximate).     Assessment:   This is a routine wellness examination for [place].  Hearing/Vision screen  Hearing Screening   125Hz  250Hz  500Hz  1000Hz  2000Hz  3000Hz  4000Hz  6000Hz  8000Hz   Right ear:           Left ear:           Vision Screening Comments: Patient gets eye exams once per year    Dietary issues and exercise activities discussed: Current Exercise Habits: The patient does not participate in regular exercise at present, Exercise limited by: None identified  Goals    . DIET - EAT MORE FRUITS AND VEGETABLES    . Patient Stated     To maintain your health     . Patient Stated     Stay engaged with others;       Depression Screen PHQ 2/9 Scores 05/30/2020 02/06/2020 05/18/2019 05/12/2018 03/13/2018 05/11/2017 02/18/2016  PHQ - 2 Score 0 0 0 0  0 0 0  PHQ- 9 Score - - - 0 - - -    Fall Risk Fall Risk  05/30/2020 02/06/2020 05/18/2019 05/12/2018 03/13/2018  Falls in the past year? 0 0 0 0 0  Number falls in  past yr: 0 0 - - -  Injury with Fall? 0 - - - -  Risk for fall due to : Impaired balance/gait No Fall Risks Medication side effect Impaired vision;Impaired balance/gait -  Follow up Falls evaluation completed;Falls prevention discussed Falls evaluation completed Falls evaluation completed;Education provided;Falls prevention discussed Education provided;Falls prevention discussed -    FALL RISK PREVENTION PERTAINING TO THE HOME:  Any stairs in or around the home? Yes  If so, are there any without handrails? No  Home free of loose throw rugs in walkways, pet beds, electrical cords, etc? Yes  Adequate lighting in your home to reduce risk of falls? Yes   ASSISTIVE DEVICES UTILIZED TO PREVENT FALLS:  Life alert? No  Use of a cane, walker or w/c? Yes  Grab bars in the bathroom? Yes  Shower chair or bench in shower? Yes  Elevated toilet seat or a handicapped toilet? No   TIMED UP AND GO:  Was the test performed? Yes .  Length of time to ambulate 10 feet: 6 sec.   Gait slow and steady without use of assistive device  Cognitive Function: MMSE - Mini Mental State Exam 01/16/2020 11/13/2018 04/12/2018 10/03/2017 05/11/2017  Not completed: - - (No Data) - (No Data)  Orientation to time 5 4 4 5  -  Orientation to Place 5 5 5 4  -  Registration 3 3 3 3  -  Attention/ Calculation 5 4 4 5  -  Recall 2 3 2 1  -  Language- name 2 objects 2 2 2 2  -  Language- repeat 1 1 1 1  -  Language- follow 3 step command 3 3 3 3  -  Language- read & follow direction 1 1 1 1  -  Write a sentence 1 1 1 1  -  Copy design 1 1 1 1  -  Total score 29 28 27 27  -     6CIT Screen 05/30/2020 05/18/2019  What Year? 0 points 0 points  What month? 0 points 0 points  What time? 0 points 0 points  Count back from 20 0 points 0 points  Months in reverse 0 points 0  points  Repeat phrase 6 points 2 points  Total Score 6 2    Immunizations Immunization History  Administered Date(s) Administered  . Influenza Split 02/25/2012  . Influenza, High Dose Seasonal PF 01/13/2016, 01/19/2017, 01/04/2018, 12/28/2019  . Influenza-Unspecified 01/31/2014, 01/09/2019  . PFIZER(Purple Top)SARS-COV-2 Vaccination 05/05/2019, 05/26/2019, 01/21/2020  . Pneumococcal Conjugate-13 10/23/2013, 03/11/2014  . Pneumococcal Polysaccharide-23 02/18/2016  . Td 05/15/2010  . Tdap 11/08/2011  . Zoster 04/09/2014    TDAP status: Up to date  Flu Vaccine status: Up to date  Pneumococcal vaccine status: Up to date  Covid-19 vaccine status: Completed vaccines  Qualifies for Shingles Vaccine? Yes   Zostavax completed Yes   Shingrix Completed?: No.    Education has been provided regarding the importance of this vaccine. Patient has been advised to call insurance company to determine out of pocket expense if they have not yet received this vaccine. Advised may also receive vaccine at local pharmacy or Health Dept. Verbalized acceptance and understanding.  Screening Tests Health Maintenance  Topic Date Due  . TETANUS/TDAP  11/07/2021  . INFLUENZA VACCINE  Completed  . DEXA SCAN  Completed  . COVID-19 Vaccine  Completed  . PNA vac Low Risk Adult  Completed    Health Maintenance  There are no preventive care reminders to display for this patient.  Colorectal cancer screening: No longer required.  Mammogram status: No longer required due to age.  Bone density status: No longer required   Lung Cancer Screening: (Low Dose CT Chest recommended if Age 80-80 years, 30 pack-year currently smoking OR have quit w/in 15years.) does not qualify.   Lung Cancer Screening Referral: n/a   Additional Screening:  Hepatitis C Screening: does not qualify; Completed N/A   Vision Screening: Recommended annual ophthalmology exams for early detection of glaucoma and other disorders of  the eye. Is the patient up to date with their annual eye exam?  Yes  Who is the provider or what is the name of the office in which the patient attends annual eye exams? Dr. Einar Gip If pt is not established with a provider, would they like to be referred to a provider to establish care? No .   Dental Screening: Recommended annual dental exams for proper oral hygiene  Community Resource Referral / Chronic Care Management: CRR required this visit?  No   CCM required this visit?  No      Plan:     I have personally reviewed and noted the following in the patient's chart:   . Medical and social history . Use of alcohol, tobacco or illicit drugs  . Current medications and supplements . Functional ability and status . Nutritional status . Physical activity . Advanced directives . List of other physicians . Hospitalizations, surgeries, and ER visits in previous 12 months . Vitals . Screenings to include cognitive, depression, and falls . Referrals and appointments  In addition, I have reviewed and discussed with patient certain preventive protocols, quality metrics, and best practice recommendations. A written personalized care plan for preventive services as well as general preventive health recommendations were provided to patient.     Ofilia Neas, LPN   07/01/9022   Nurse Notes: None

## 2020-05-31 ENCOUNTER — Other Ambulatory Visit: Payer: Self-pay | Admitting: Neurology

## 2020-07-16 ENCOUNTER — Telehealth: Payer: Self-pay | Admitting: Neurology

## 2020-07-16 MED ORDER — LEVETIRACETAM 250 MG PO TABS: 250.0000 mg | ORAL_TABLET | Freq: Two times a day (BID) | ORAL | 0 refills | Status: DC

## 2020-07-16 NOTE — Addendum Note (Signed)
Addended by: Brandon Melnick on: 07/16/2020 02:55 PM   Modules accepted: Orders

## 2020-07-16 NOTE — Telephone Encounter (Signed)
Pt. states she has a question for RN. She did not state what it is.

## 2020-07-16 NOTE — Telephone Encounter (Signed)
Don't see that we have ever given tramadol, I will not offer this as it is controlled.  Review of chart indicates Lyrica caused abdominal cramps, Cymbalta caused nausea, side effects from nortriptyline.  Previously was seeing pain management, may need to get back in with them. We could retry Cymbalta low dose 20 mg daily or possibly Keppra 250 mg twice daily. Other option is add in gabapentin 100-200 mg to take PRN for break through pain.

## 2020-07-16 NOTE — Telephone Encounter (Signed)
Spoke to pt and she decided to try keppra 250mg  po bid.  Order placed.

## 2020-07-16 NOTE — Telephone Encounter (Signed)
I called pt.  She is asking for tramadol for neuropathy pain.  (also mentioned has Lower back disc buldging).  I relayed that tramadol is narcotic, controlled substance), not used for for neuropathy pain. She is going [employer] and asked for something.  She is taking gabapentin 600mg  po qid.  She is not driving (her son is).  She stated the feet hurt 5-7 min from standing.  Has appt one yr 01-19-21 with Dr. Krista Blue.  Increase gabapentin?

## 2020-07-25 DIAGNOSIS — Z23 Encounter for immunization: Secondary | ICD-10-CM | POA: Diagnosis not present

## 2020-08-08 ENCOUNTER — Other Ambulatory Visit: Payer: Self-pay | Admitting: Neurology

## 2020-08-27 DIAGNOSIS — L821 Other seborrheic keratosis: Secondary | ICD-10-CM | POA: Diagnosis not present

## 2020-08-27 DIAGNOSIS — L590 Erythema ab igne [dermatitis ab igne]: Secondary | ICD-10-CM | POA: Diagnosis not present

## 2020-08-27 DIAGNOSIS — Z85828 Personal history of other malignant neoplasm of skin: Secondary | ICD-10-CM | POA: Diagnosis not present

## 2020-08-27 DIAGNOSIS — D692 Other nonthrombocytopenic purpura: Secondary | ICD-10-CM | POA: Diagnosis not present

## 2020-08-30 ENCOUNTER — Other Ambulatory Visit: Payer: Self-pay | Admitting: Internal Medicine

## 2020-09-19 ENCOUNTER — Other Ambulatory Visit: Payer: Self-pay | Admitting: Internal Medicine

## 2020-10-01 ENCOUNTER — Other Ambulatory Visit: Payer: Self-pay

## 2020-10-01 ENCOUNTER — Encounter: Payer: Self-pay | Admitting: Internal Medicine

## 2020-10-01 ENCOUNTER — Ambulatory Visit (INDEPENDENT_AMBULATORY_CARE_PROVIDER_SITE_OTHER): Payer: Medicare Other | Admitting: Internal Medicine

## 2020-10-01 VITALS — BP 122/66 | HR 73 | Temp 98.1°F | Ht 61.0 in | Wt 119.8 lb

## 2020-10-01 DIAGNOSIS — L8942 Pressure ulcer of contiguous site of back, buttock and hip, stage 2: Secondary | ICD-10-CM | POA: Diagnosis not present

## 2020-10-01 NOTE — Addendum Note (Signed)
Addended by: Nilda Riggs on: 10/01/2020 11:08 AM   Modules accepted: Orders

## 2020-10-01 NOTE — Progress Notes (Signed)
Acute office Visit     This visit occurred during the SARS-CoV-2 public health emergency.  Safety protocols were in place, including screening questions prior to the visit, additional usage of staff PPE, and extensive cleaning of exam room while observing appropriate contact time as indicated for disinfecting solutions.    CC/Reason for Visit: "Spot on back"  HPI: Susan Ford is a 87 y.o. female who is coming in today for the above mentioned reasons.  She is a patient who follows routinely with Dr. Regis Bill, here today for a lesion on her back that has been present for about 2 weeks.  She states it is draining and has been applying Band-Aids but she wanted to get it checked out.  She denies fever or pain.  She has a history of neuropathy and bulging disks in her lumbar spine and because of this she spends a lot of time sitting down.  She tells me that she uses her heating pad constantly on the couch.  Past Medical/Surgical History: Past Medical History:  Diagnosis Date  . Arthritis   . Cataract 2007   bilateral cat ext   . Cholecystitis   . Complication of anesthesia Feb 21, 2014   atrial fib after cholecystectomy, lasted a few hours, none since  . Dysrhythmia 02-21-2014   after cholecystectomy had atrial fib for a few hours, none since  . GERD (gastroesophageal reflux disease)    Had endoscopy no history of Barrett's  . Hx of nonmelanoma skin cancer   . Unspecified hereditary and idiopathic peripheral neuropathy     Past Surgical History:  Procedure Laterality Date  . ABDOMINAL HYSTERECTOMY  15 yrs ago   complete  . BREAST CYST ASPIRATION    . CHOLECYSTECTOMY N/A 02/21/2014   Procedure: LAPAROSCOPIC CHOLECYSTECTOMY WITH INTRAOPERATIVE CHOLANGIOGRAM;  Surgeon: Doreen Salvage, MD;  Location: [facility];  Service: General;  Laterality: N/A;  . ENDOSCOPIC RETROGRADE CHOLANGIOPANCREATOGRAPHY (ERCP) WITH PROPOFOL N/A 05/29/2014   Procedure: ENDOSCOPIC RETROGRADE  CHOLANGIOPANCREATOGRAPHY (ERCP) WITH PROPOFOL;  Surgeon: Arta Silence, MD;  Location: [facility] END[facility];  Service: Endoscopy;  Laterality: N/A;  . ERCP N/A 02/24/2014   Procedure: ENDOSCOPIC RETROGRADE CHOLANGIOPANCREATOGRAPHY (ERCP);  Surgeon: Arta Silence, MD;  [facility] ENDOSCOPY;  Service: Endoscopy;  Laterality: N/A;  . EYE SURGERY Bilateral 2007   both eyes cataracts with lens replacments  . LUMBAR LAMINECTOMY/DECOMPRESSION MICRODISCECTOMY  02/01/2012   Procedure: LUMBAR LAMINECTOMY/DECOMPRESSION MICRODISCECTOMY 2 LEVELS;  Surgeon: Kristeen Miss, MD;  Location: [facility];  Service: Neurosurgery;  Laterality: Left;  Left Lumbar Four-Five Lumbar Five-Sacral One Laminectomies/Foraminotomies/Microscope  . SPYGLASS CHOLANGIOSCOPY N/A 05/29/2014   Procedure: SPYGLASS CHOLANGIOSCOPY;  Surgeon: Arta Silence, MD;  Location: [facility] ENDOSCOPY;  Service: Endoscopy;  Laterality: N/A;  . TONSILLECTOMY  age 28    Social History:  reports that she quit smoking about 57 years ago. Her smoking use included cigarettes. She has a 1.75 pack-year smoking history. She has never used smokeless tobacco. She reports current alcohol use of about 7.0 standard drinks of alcohol per week. She reports that she does not use drugs.  Allergies: Allergies  Allergen Reactions  . Keflex [Cephalexin]     Dizziness  . Lyrica [Pregabalin] Other (See Comments)    "High Fever"   . Oxytetracycline Other (See Comments)    "High Fever" caused by terramycin  . Memantine Rash    Family History:  Family History  Problem Relation Age of Onset  . Neurodegenerative disease Mother  Shy Drager died at 32  . Alcohol abuse Other   . Heart disease Other      Current Outpatient Medications:  .  acetaminophen (TYLENOL) 500 MG tablet, Take 500 mg by mouth every 6 (six) hours as needed for mild pain. , Disp: , Rfl:  .  ascorbic acid (VITAMIN C) 1000 MG tablet, Take 1,000 mg by mouth daily after lunch. , Disp: , Rfl:  .   aspirin 81 MG tablet, Take 81 mg by mouth daily after lunch., Disp: , Rfl:  .  celecoxib (CELEBREX) 200 MG capsule, Take 1 capsule (200 mg total) by mouth daily., Disp: 90 capsule, Rfl: 0 .  donepezil (ARICEPT) 5 MG tablet, TAKE 1 TABLET BY MOUTH EVERYDAY AT BEDTIME, Disp: 90 tablet, Rfl: 1 .  estradiol (CLIMARA - DOSED IN MG/24 HR) 0.025 mg/24hr patch, PLACE 1 PATCH (0.025 MG TOTAL) ONTO THE SKIN ONCE A WEEK. PLACE 1 PATCH ON ONCE A WEEK ON SATURDAY, Disp: 8 patch, Rfl: 1 .  famotidine (PEPCID) 20 MG tablet, Take 20 mg by mouth daily as needed for heartburn or indigestion., Disp: , Rfl:  .  gabapentin (NEURONTIN) 600 MG tablet, TAKE 1 TABLET BY MOUTH 4 TIMES DAILY, Disp: 360 tablet, Rfl: 3 .  levETIRAcetam (KEPPRA) 250 MG tablet, TAKE 1 TABLET BY MOUTH TWICE A DAY, Disp: 60 tablet, Rfl: 5 .  levocetirizine (XYZAL) 5 MG tablet, Take 10 mg by mouth at bedtime., Disp: , Rfl:  .  Lidocaine HCl-Benzyl Alcohol (SALONPAS LIDOCAINE PLUS) 4-10 % CREA, Apply topically., Disp: , Rfl:  .  Multiple Vitamin (MULTIVITAMIN) tablet, Take 1 tablet by mouth daily after lunch., Disp: , Rfl:  .  simvastatin (ZOCOR) 10 MG tablet, TAKE 1 TABLET BY MOUTH EVERYDAY AT BEDTIME, Disp: 90 tablet, Rfl: 0  Review of Systems:  Constitutional: Denies fever, chills, diaphoresis, appetite change and fatigue.  HEENT: Denies photophobia, eye pain, redness, hearing loss, ear pain, congestion, sore throat, rhinorrhea, sneezing, mouth sores, trouble swallowing, neck pain, neck stiffness and tinnitus.   Respiratory: Denies SOB, DOE, cough, chest tightness,  and wheezing.   Cardiovascular: Denies chest pain, palpitations and leg swelling.  Gastrointestinal: Denies nausea, vomiting, abdominal pain, diarrhea, constipation, blood in stool and abdominal distention.  Genitourinary: Denies dysuria, urgency, frequency, hematuria, flank pain and difficulty urinating.  Endocrine: Denies: hot or cold intolerance, sweats, changes in hair or nails,  polyuria, polydipsia. Musculoskeletal: Denies myalgias, back pain, joint swelling, arthralgias and gait problem.  Skin: Denies pallor, rash. Neurological: Denies dizziness, seizures, syncope, weakness, light-headedness, numbness and headaches.  Hematological: Denies adenopathy. Easy bruising, personal or family bleeding history  Psychiatric/Behavioral: Denies suicidal ideation, mood changes, confusion, nervousness, sleep disturbance and agitation    Physical Exam: Vitals:   10/01/20 1027  BP: 122/66  Pulse: 73  Temp: 98.1 F (36.7 C)  TempSrc: Oral  SpO2: 95%  Weight: 119 lb 12.8 oz (54.3 kg)  Height: 5\' 1"  (1.549 m)    Body mass index is 22.64 kg/m.   Constitutional: NAD, calm, comfortable, ambulates with a cane Eyes: PERRL, lids and conjunctivae normal ENMT: Mucous membranes are moist. Skin: See below picture of back:     Neurologic: CN 2-12 grossly intact. Sensation intact, DTR normal. Strength 5/5 in all 4.  Psychiatric: Normal judgment and insight. Alert and oriented x 3. Normal mood.    Impression and Plan:  Pressure injury of contiguous region involving left buttock and hip, stage 2 ([place])  -This is most certainly a pressure injury of  her left hip and buttock.  She probably also has some mild burns from the heating pad. -I have applied a DuoDERM to provide some cushioning to the open areas. -She has been advised on frequent positioning and purchasing a donut cushion. -I will arrange for home health RN to assist with wound care at home as she is elderly and injury is on her back making it difficult to tend to.  She also lives independently after her husband passed away 7 years so has no one at home to help.  Time spent: 32 minutes reviewing chart, interviewing and examining patient, formulating plan of care and tending to wound care.    Lelon Frohlich, MD [facility] at [facility]

## 2020-10-07 ENCOUNTER — Telehealth: Payer: Self-pay | Admitting: Internal Medicine

## 2020-10-07 NOTE — Telephone Encounter (Signed)
ok 

## 2020-10-07 NOTE — Telephone Encounter (Signed)
Patient has a stage 2 ulcer on her back.  Hoyle Sauer from [facility] is requesting [facility] go to [facility] close to [facility].  Patient states she can drive herself there.

## 2020-10-08 NOTE — Telephone Encounter (Signed)
Susan Ford has been given verbal orders.

## 2020-10-08 NOTE — Telephone Encounter (Signed)
I spoke with Susan Ford and she referred pt to wound clinic. Wound clinic is requesting verbal orders for SN for wound care. Please advise.

## 2020-10-08 NOTE — Telephone Encounter (Signed)
Do referral to wound care I have not seen the patient for this what ever works.

## 2020-10-09 NOTE — Telephone Encounter (Signed)
Per Hoyle Sauer with [facility] a referral for wound care has already been placed for this pt. Hoyle Sauer stated that the clinic does not fax order forms so verbal orders must be given. The wound clinic is requesting verbal orders from PCP to have skilled nursing care for the pt. Please advice.

## 2020-10-14 ENCOUNTER — Other Ambulatory Visit: Payer: Self-pay

## 2020-10-14 ENCOUNTER — Encounter: Payer: Self-pay | Admitting: Internal Medicine

## 2020-10-14 ENCOUNTER — Ambulatory Visit (INDEPENDENT_AMBULATORY_CARE_PROVIDER_SITE_OTHER): Payer: Medicare Other | Admitting: Internal Medicine

## 2020-10-14 VITALS — BP 126/70 | HR 75 | Temp 98.4°F | Ht 61.0 in | Wt 121.2 lb

## 2020-10-14 DIAGNOSIS — G3184 Mild cognitive impairment, so stated: Secondary | ICD-10-CM

## 2020-10-14 DIAGNOSIS — Z79899 Other long term (current) drug therapy: Secondary | ICD-10-CM

## 2020-10-14 DIAGNOSIS — G609 Hereditary and idiopathic neuropathy, unspecified: Secondary | ICD-10-CM | POA: Diagnosis not present

## 2020-10-14 DIAGNOSIS — M48061 Spinal stenosis, lumbar region without neurogenic claudication: Secondary | ICD-10-CM | POA: Diagnosis not present

## 2020-10-14 DIAGNOSIS — T148XXA Other injury of unspecified body region, initial encounter: Secondary | ICD-10-CM

## 2020-10-14 DIAGNOSIS — T300 Burn of unspecified body region, unspecified degree: Secondary | ICD-10-CM

## 2020-10-14 NOTE — Telephone Encounter (Signed)
Pt was seen in office regarding open wound on back.

## 2020-10-14 NOTE — Patient Instructions (Addendum)
Skin looking better   You have heat damage to the skin . Do not use lidocaine  on the broken skin areas.  No heating pads or extreme  temperature to the skin for now.  Make appt with dr Susan Ford   about  options of help with the back pain.    Tylenol  ok.   No need to go to the wound clinic. At this time.

## 2020-10-14 NOTE — Progress Notes (Signed)
Chief Complaint  Patient presents with   Open sore     Patient reports open sore on back, x1 month, Tried Neosporin with no relief     HPI: Susan Ford 87 y.o. come in for follow-up of wound. Was noted to have a skin wound referred to the wound clinic by Dr. Jerilee Hoh.? She is following up with me other evaluation is indicated.  She was using heating pad sitting in her chair and had some blisters and deeper wounds.  Since that time she stopped the heating pad has been using topical Neosporin although she also has the topical lidocaine ointment which does help avoiding the open area.  There has been no more drainage recently.  Seems to be drier Her back is been getting worse over the last 6 months to a year she has seen Dr. Ellene Route in the remote past and Dr. Brien Few almost a year ago.  She had some type of injections in the remote past wonders if it would help again. She is taking Tylenol lives alone drives cautiously only in today. She sees neurology for memory issues but is not taking Aricept or the Keppra on the list. Organizes her medicines but not a pillbox.  ROS: See pertinent positives and negatives per HPI.  Past Medical History:  Diagnosis Date   Arthritis    Cataract 2007   bilateral cat ext    Cholecystitis    Complication of anesthesia Feb 21, 2014   atrial fib after cholecystectomy, lasted a few hours, none since   Dysrhythmia 02-21-2014   after cholecystectomy had atrial fib for a few hours, none since   GERD (gastroesophageal reflux disease)    Had endoscopy no history of Barrett's   Hx of nonmelanoma skin cancer    Unspecified hereditary and idiopathic peripheral neuropathy     Family History  Problem Relation Age of Onset   Neurodegenerative disease Mother        Shy Drager died at 54   Alcohol abuse Other    Heart disease Other     Social History   Socioeconomic History   Marital status: Widowed    Spouse name: Not on file   Number of  children: 3   Years of education: 15 years   Highest education level: Associate degree: occupational, Hotel manager, or vocational program  Occupational History   Occupation: Retired    Comment: Therapist, sports  Tobacco Use   Smoking status: Former    Packs/day: 0.25    Years: 7.00    Pack years: 1.75    Types: Cigarettes    Quit date: 04/27/1963    Years since quitting: 57.5   Smokeless tobacco: Never  Substance and Sexual Activity   Alcohol use: Yes    Alcohol/week: 7.0 standard drinks    Types: 7 Glasses of wine per week    Comment: 1 glass of wine daily   Drug use: No    Types: Hydrocodone   Sexual activity: Yes  Other Topics Concern   Not on file  Social History Narrative   Retired Therapist, sports    widowed since    Spring  15    [facility] [employer] of 1   Regular exercise former smoker   G3P3   From [place] originally      05/12/2018:   Lives alone on ranch house, but has stairs to garage with "acorn chair" lift    Son in [place], and two daughters out of state, all of whom are supportive  Worked in NICU, 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 Misses working the floor.          Social Determinants of Health   Financial Resource Strain: Low Risk    Difficulty of Paying Living Expenses: Not hard at all  Food Insecurity: No Food Insecurity   Worried About Charity fundraiser in the Last Year: Never true   [employer] in the Last Year: Never true  Transportation Needs: No Transportation Needs   Lack of Transportation (Medical): No   Lack of Transportation (Non-Medical): No  Physical Activity: Inactive   Days of Exercise per Week: 0 days   Minutes of Exercise per Session: 0 min  Stress: No Stress Concern Present   Feeling of Stress : Not at all  Social Connections: Moderately Isolated   Frequency of Communication with Friends and Family: More than three times a week   Frequency of Social Gatherings with Friends and Family: Three times a week   Attends Religious Services: More  than 4 times per year   Active Member of Clubs or Organizations: No   Attends Archivist Meetings: Never   Marital Status: Widowed    Outpatient Medications Prior to Visit  Medication Sig Dispense Refill   acetaminophen (TYLENOL) 500 MG tablet Take 500 mg by mouth every 6 (six) hours as needed for mild pain.      ascorbic acid (VITAMIN C) 1000 MG tablet Take 1,000 mg by mouth daily after lunch.      aspirin 81 MG tablet Take 81 mg by mouth daily after lunch.     celecoxib (CELEBREX) 200 MG capsule Take 1 capsule (200 mg total) by mouth daily. 90 capsule 0   estradiol (CLIMARA - DOSED IN MG/24 HR) 0.025 mg/24hr patch PLACE 1 PATCH (0.025 MG TOTAL) ONTO THE SKIN ONCE A WEEK. PLACE 1 PATCH ON ONCE A WEEK ON SATURDAY 8 patch 1   famotidine (PEPCID) 20 MG tablet Take 20 mg by mouth daily as needed for heartburn or indigestion.     gabapentin (NEURONTIN) 600 MG tablet TAKE 1 TABLET BY MOUTH 4 TIMES DAILY 360 tablet 3   levocetirizine (XYZAL) 5 MG tablet Take 10 mg by mouth at bedtime.     Lidocaine HCl-Benzyl Alcohol (SALONPAS LIDOCAINE PLUS) 4-10 % CREA Apply topically.     Multiple Vitamin (MULTIVITAMIN) tablet Take 1 tablet by mouth daily after lunch.     simvastatin (ZOCOR) 10 MG tablet TAKE 1 TABLET BY MOUTH EVERYDAY AT BEDTIME 90 tablet 0   donepezil (ARICEPT) 5 MG tablet TAKE 1 TABLET BY MOUTH EVERYDAY AT BEDTIME (Patient not taking: Reported on 10/14/2020) 90 tablet 1   levETIRAcetam (KEPPRA) 250 MG tablet TAKE 1 TABLET BY MOUTH TWICE A DAY (Patient not taking: Reported on 10/14/2020) 60 tablet 5   No facility-administered medications prior to visit.     EXAM:  BP 126/70 (BP Location: Left Arm, Patient Position: Sitting, Cuff Size: Normal)   Pulse 75   Temp 98.4 F (36.9 C) (Oral)   Ht 5\' 1"  (1.549 m)   Wt 121 lb 3.2 oz (55 kg)   SpO2 96%   BMI 22.90 kg/m   Body mass index is 22.9 kg/m.  GENERAL: vitals reviewed and listed above, alert, oriented, appears well  hydrated and in no acute distress walks somewhat bent over independent has a cane normal interaction HEENT: atraumatic, conjunctiva  clear, no obvious abnormalities on inspection of external nose and ears OP :  n masked NECK: no obvious masses on inspection palpation  LUNGS: No respiratory difficulty CV: HRRR, no clubbing cyanosis or  peripheral edema nl cap refill  MS: moves all extremities without noticeable focal  abnormality  Walks flexed independent with a cane. Skin continued heat changes on the superficial skin of the back lumbosacral area no deep ulcer noted there is a very superficial blisterlike 1 cm just to the left of center with no discharge appears dry no blood or pus. Picture taken. PSYCH: pleasant and cooperative, no obvious depression or anxiety   Lab Results  Component Value Date   WBC 7.4 02/06/2020   HGB 13.4 02/06/2020   HCT 40.0 02/06/2020   PLT 260 02/06/2020   GLUCOSE 99 02/06/2020   CHOL 167 02/06/2020   TRIG 153 (H) 02/06/2020   HDL 71 02/06/2020   LDLDIRECT 153 02/12/2009   LDLCALC 72 02/06/2020   ALT 10 02/06/2020   AST 17 02/06/2020   NA 134 (L) 02/06/2020   K 4.5 02/06/2020   CL 100 02/06/2020   CREATININE 0.62 02/06/2020   BUN 10 02/06/2020   CO2 25 02/06/2020   TSH 0.89 07/16/2016   INR 0.91 07/05/2017   HGBA1C 5.4 02/06/2020   BP Readings from Last 3 Encounters:  10/14/20 126/70  10/01/20 122/66  05/30/20 116/62    ASSESSMENT AND PLAN:  Discussed the following assessment and plan:  Thermal injury - heat to skin from hjeating pad   Blister of skin - looks much better today  local care.  Spinal stenosis of lumbar region, unspecified whether neurogenic claudication present  Medication management  Hereditary and idiopathic peripheral neuropathy  Mild cognitive impairment Impaired to previous picture it is much improved there is no discharge or deep ulcer probably a burn related to her heat and friction Continued avoidance of heat  to this area as she has heat related injury Her back pain has become more problematic reviewed her record advise she contact Dr. Dellis Filbert office for follow-up to see if any other interventions would be helpful such as injections.  If the area does not continue to heal get back with [place] otherwise no temperature extremes on the skin.  She has a follow-up with neurology but is not taking the medicines on the list.  30 minutes review  counsel plan  -Patient advised to return or notify health care team  if  new concerns arise.  Patient Instructions  Skin looking better   You have heat damage to the skin . Do not use lidocaine  on the broken skin areas.  No heating pads or extreme  temperature to the skin for now.  Make appt with dr Brien Few   about  options of help with the back pain.    Tylenol  ok.   No need to go to the wound clinic. At this time.   Standley Brooking. Adarsh Mundorf M.D.

## 2020-10-15 ENCOUNTER — Telehealth: Payer: Self-pay

## 2020-10-15 NOTE — Telephone Encounter (Signed)
I spoke with Shana at [facility] wound clinic and the referral has been cancelled.

## 2020-10-15 NOTE — Telephone Encounter (Signed)
-----   Message from Burnis Medin, MD sent at 10/14/2020  3:20 PM EDT ----- Please cancel the wound clinic referral

## 2020-11-03 ENCOUNTER — Other Ambulatory Visit: Payer: Self-pay | Admitting: Internal Medicine

## 2020-11-04 ENCOUNTER — Telehealth: Payer: Self-pay | Admitting: Internal Medicine

## 2020-11-04 NOTE — Telephone Encounter (Signed)
Pt is calling in stating that she would like for Dr. Regis Bill to say that it is okay for her to receive the Rx estradiol (CLIMARA) Patches 0.025 MG.  Pharm:  [facility] on [address] in [place], [place]. Pt would like to have a call back b/c she is requesting a continuous refill of this medication.  Pt is aware that she has one at her pharmacy and it maybe that she has to wait until Friday 11/07/2020 due to the insurance.

## 2020-11-04 NOTE — Telephone Encounter (Signed)
Pt call and stated she is returning your can  will be waiting for a call back.

## 2020-11-04 NOTE — Telephone Encounter (Signed)
I have initialted a PA for the patients Estradiol 0.025 mg patch. [facility] and has been approved by the patients insurance. Key: [id number]. I left a message for the pt to return my call in regards to the approval of her PA.

## 2020-11-04 NOTE — Telephone Encounter (Signed)
I spoke with the pt and informed her that her PA for Estradiol 0.025 mg patches have been approved by her insurance.

## 2020-11-06 DIAGNOSIS — M961 Postlaminectomy syndrome, not elsewhere classified: Secondary | ICD-10-CM | POA: Diagnosis not present

## 2020-11-06 DIAGNOSIS — M533 Sacrococcygeal disorders, not elsewhere classified: Secondary | ICD-10-CM | POA: Diagnosis not present

## 2020-11-06 DIAGNOSIS — M47816 Spondylosis without myelopathy or radiculopathy, lumbar region: Secondary | ICD-10-CM | POA: Diagnosis not present

## 2020-11-06 DIAGNOSIS — M461 Sacroiliitis, not elsewhere classified: Secondary | ICD-10-CM | POA: Diagnosis not present

## 2020-11-06 DIAGNOSIS — R030 Elevated blood-pressure reading, without diagnosis of hypertension: Secondary | ICD-10-CM | POA: Diagnosis not present

## 2020-11-14 DIAGNOSIS — R030 Elevated blood-pressure reading, without diagnosis of hypertension: Secondary | ICD-10-CM | POA: Diagnosis not present

## 2020-11-14 DIAGNOSIS — M48062 Spinal stenosis, lumbar region with neurogenic claudication: Secondary | ICD-10-CM | POA: Diagnosis not present

## 2020-12-05 ENCOUNTER — Other Ambulatory Visit: Payer: Self-pay | Admitting: Internal Medicine

## 2020-12-08 ENCOUNTER — Other Ambulatory Visit: Payer: Self-pay | Admitting: Internal Medicine

## 2020-12-10 DIAGNOSIS — M48062 Spinal stenosis, lumbar region with neurogenic claudication: Secondary | ICD-10-CM | POA: Diagnosis not present

## 2020-12-10 DIAGNOSIS — M5416 Radiculopathy, lumbar region: Secondary | ICD-10-CM | POA: Diagnosis not present

## 2020-12-22 ENCOUNTER — Telehealth: Payer: Self-pay | Admitting: Neurology

## 2020-12-22 NOTE — Telephone Encounter (Signed)
Pt called, having pain in legs and lower back. gabapentin (NEURONTIN) 600 MG tablet is not working. Would like a call from the nurse.

## 2020-12-22 NOTE — Telephone Encounter (Signed)
Call from 07/16/20:  Spoke to pt and she decided to try keppra '250mg'$  po bid.  Order placed.       Don't see that we have ever given tramadol, I will not offer this as it is controlled.  Review of chart indicates Lyrica caused abdominal cramps, Cymbalta caused nausea, side effects from nortriptyline.  Previously was seeing pain management, may need to get back in with them. We could retry Cymbalta low dose 20 mg daily or possibly Keppra 250 mg twice daily. Other option is add in gabapentin 100-200 mg to take PRN for break through pain.     ____________________________________________________________________________ I called the patient to verify her current medications. She never started the generic Keppra '250mg'$  BID. She will pick it up today and try the medication. She will keep her pending appt on 01/19/21.

## 2020-12-23 DIAGNOSIS — Z23 Encounter for immunization: Secondary | ICD-10-CM | POA: Diagnosis not present

## 2020-12-24 ENCOUNTER — Telehealth: Payer: Self-pay | Admitting: Neurology

## 2020-12-24 NOTE — Telephone Encounter (Signed)
Pt is unable to take levETIRAcetam (KEPPRA) 250 MG tablet any longer. She doesn't like how it makes her feel. Pt states it makes her feel weird, very strange.  Please call.

## 2020-12-24 NOTE — Telephone Encounter (Signed)
Returned patient's call.  She said she tried the Keppra 250 mg for the first time yesterday but it is making her feel dizzy and did not help her pain, She took it twice yesterday.  She is on Gabapentin 600 mg , 4 times a day also but feels it isn't as effective as it once was.    I advised patient that I would forward her concern regarding the Keppra to Dr. Krista Blue, however, she does have an office appointment on 09/26 and strongly encouraged her to keep her appointment.    She has tried both Lyrica and Cymbalta but didn't like either of those.

## 2020-12-28 ENCOUNTER — Other Ambulatory Visit: Payer: Self-pay | Admitting: Internal Medicine

## 2021-01-13 DIAGNOSIS — Z23 Encounter for immunization: Secondary | ICD-10-CM | POA: Diagnosis not present

## 2021-01-19 ENCOUNTER — Ambulatory Visit (INDEPENDENT_AMBULATORY_CARE_PROVIDER_SITE_OTHER): Payer: Medicare Other | Admitting: Neurology

## 2021-01-19 ENCOUNTER — Ambulatory Visit: Payer: Medicare Other | Admitting: Neurology

## 2021-01-19 VITALS — BP 122/62 | HR 68 | Ht 64.0 in | Wt 130.0 lb

## 2021-01-19 DIAGNOSIS — G8929 Other chronic pain: Secondary | ICD-10-CM

## 2021-01-19 DIAGNOSIS — R269 Unspecified abnormalities of gait and mobility: Secondary | ICD-10-CM | POA: Diagnosis not present

## 2021-01-19 DIAGNOSIS — M545 Low back pain, unspecified: Secondary | ICD-10-CM | POA: Diagnosis not present

## 2021-01-19 DIAGNOSIS — R413 Other amnesia: Secondary | ICD-10-CM

## 2021-01-19 MED ORDER — DONEPEZIL HCL 10 MG PO TABS: 10.0000 mg | ORAL_TABLET | Freq: Every day | ORAL | 11 refills | Status: DC

## 2021-01-19 NOTE — Progress Notes (Signed)
ASSESSMENT AND PLAN 87 y.o. year old     Chronic low back pain, lumbar radiculopathy,  gait abnormality, bilateral foot drop,  Most consistent with bilateral lumbosacral radiculopathy,  Already on polypharmacy treatment, gabapentin 600 mg 4 times a day, Celexa 200 mg twice a day  Previously tried could not tolerate Cymbalta, Lyrica, nortriptyline,  She does not want to have physical therapy  Worsening memory loss  MOCA 18/30  Personally reviewed MRI of the brain 2019, moderate atrophy, moderate supratentorium small vessel disease   Laboratory evaluation to rule out treatable etiology  Add on Aricept 10 mg daily  Reported rash with Namenda treatment in the past  Also talked with her son Legrand Como over the phone to update him on patient's information, suggested more supervision for patient due to her gait abnormality, and worsening memory loss  DIAGNOSTIC DATA (LABS, IMAGING, TESTING) - I reviewed patient records, labs, notes, testing and imaging myself where available.   HISTORY OF PRESENT ILLNESS: Susan Ford is a 87 year-old right-handed white female,She was a patient of Dr. Erling Cruz, last visit was May 2014    She had a history of lumbar decompression surgery, presenting with chronic low back pain, mild gait difficulty due to pain.   MRI of the lumbar spine 12/25/2010 shows diffuse spondylitic changes and progression of degenerative changes at L3-4 and L4-5 and modest broad disc bulge at L5-S1. There is evidence of neuroforaminal  stenosis on the left at L4-5. She has pain in her feet and aching from her knees down.   EMG/NCV 12/24/2011 showing progression of peripheral neuropathy versus a prior study 09/13/2011. There was also a  mild overlying acute L5 radiculopathy on the left. She underwent  surgical decompression at L4-5 and L5-S1 with bilateral foraminotomies and microdiscectomy on the left at L4-5 02/01/2012 by Dr. Ellene Route, Surgery helped her.  She still complains of pain at  low back pain, waist line, going down her leg, at both leg.   She denies bowel/bladder incontinence    She has difficulty walking, because of low back pain, worst pain in the morning, she usually take her hydrocodone early morning, then napping for a while, then she can function again.   Previously she has tried Lyrica which caused abdominal cramps,  Cymbalta, caused nausea. She had side effects from nortriptyline 20 mg per night. She is on gabapentin 600, 1-1/2  tablets 4 times per day and complains of memory loss on the medication and now questions any improvement in pain. She uses a cane, but not in her house.    She underwent an epidural steroid injection 10/2011 with improvement in her back pain.    She is independent in activities of daily living including driving a car, cooking,cleaning, shopping, and doing laundry.    UPDATE Sept 26 2022: She lives alone, she is still driving, today her main concern is increased bilateral lower extremity weakness, even after standing or walking short distance, she would feel bilateral lower extremity heaviness, weak, but once she sits down, or lying down, she is not in pain, she denies bowel and bladder incontinence, she does has worsening gait abnormality  Recently was seen by Dr. 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  Generalized: Well developed, in no acute distress  Montreal Cognitive Assessment  01/19/2021  Visuospatial/ Executive (0/5) 2  Naming (0/3) 2  Attention: Read  list of digits (0/2) 2  Attention: Read list of letters (0/1) 1  Attention: Serial 7 subtraction starting at 100 (0/3) 1  Language: Repeat phrase (0/2) 2  Language : Fluency (0/1) 0  Abstraction (0/2) 2  Delayed Recall (0/5) 0  Orientation (0/6) 6  Total 18      MENTAL STATUS: Speech/Cognition: Awake, alert, normal speech, oriented to history taking and casual conversation.  CRANIAL NERVES: CN II: Visual fields are full to confrontation.  Pupils are round equal and briskly reactive to light. CN III, IV, VI: extraocular movement are normal. No ptosis. CN V: Facial sensation is intact to light touch. CN VII: Face is symmetric with normal eye closure and smile. CN VIII: Hearing is normal to casual conversation CN IX, X: Palate elevates symmetrically. Phonation is normal. CN XI: Head turning and shoulder shrug are intact   MOTOR: She has mild to moderate bilateral ankle dorsiflexion weakness  REFLEXES: Reflexes are 2  and symmetric at the biceps, triceps, knees and absent at ankles. Plantar responses are flexor.  SENSORY: Mildly length dependent decreased light touch, pinprick, vibratory sensation  COORDINATION: There is no trunk or limb ataxia.    GAIT/STANCE: She needs push-up to get up from seated position, leaning forward, bilateral foot drop, unsteady.   REVIEW OF SYSTEMS: Out of a complete 14 system review of symptoms, the patient complains only of the following symptoms, and all other reviewed systems are negative.  Back pain  ALLERGIES: Allergies  Allergen Reactions   Keflex [Cephalexin]     Dizziness   Lyrica [Pregabalin] Other (See Comments)    "High Fever"    Oxytetracycline Other (See Comments)    "High Fever" caused by terramycin   Memantine Rash    HOME MEDICATIONS: Outpatient Medications Prior to Visit  Medication Sig Dispense Refill   acetaminophen (TYLENOL) 500 MG tablet Take 500 mg by mouth every 6 (six) hours as needed for mild pain.       celecoxib (CELEBREX) 200 MG capsule Take 1 capsule (200 mg total) by mouth daily. 90 capsule 0   estradiol (CLIMARA - DOSED IN MG/24 HR) 0.025 mg/24hr patch PLACE 1 PATCH (0.025 MG TOTAL) ONTO THE SKIN ONCE A WEEK ON SATURDAY 8 patch 1   gabapentin (NEURONTIN) 600 MG tablet TAKE 1 TABLET BY MOUTH 4 TIMES DAILY 360 tablet 3   levocetirizine (XYZAL) 5 MG tablet Take 10 mg by mouth at bedtime.     Multiple Vitamin (MULTIVITAMIN) tablet Take 1 tablet by mouth daily after lunch.     simvastatin (ZOCOR) 10 MG tablet TAKE 1 TABLET BY MOUTH EVERYDAY AT BEDTIME 90 tablet 0   ascorbic acid (VITAMIN C) 1000 MG tablet Take 1,000 mg by mouth daily after lunch.      aspirin 81 MG tablet Take 81 mg by mouth daily after lunch.     donepezil (ARICEPT) 5 MG tablet TAKE 1 TABLET BY MOUTH EVERYDAY AT BEDTIME (Patient not taking: Reported on 10/14/2020) 90 tablet 1   famotidine (PEPCID) 20 MG tablet Take 20 mg by mouth daily as needed for  heartburn or indigestion.     levETIRAcetam (KEPPRA) 250 MG tablet TAKE 1 TABLET BY MOUTH TWICE A DAY (Patient not taking: Reported on 10/14/2020) 60 tablet 5   Lidocaine HCl-Benzyl Alcohol (SALONPAS LIDOCAINE PLUS) 4-10 % CREA Apply topically.     No facility-administered medications prior to visit.    PAST MEDICAL HISTORY: Past Medical History:  Diagnosis Date   Arthritis    Cataract 2007   bilateral cat ext    Cholecystitis    Complication of anesthesia Feb 21, 2014   atrial fib after cholecystectomy, lasted a few hours, none since   Dysrhythmia 02-21-2014   after cholecystectomy had atrial fib for a few hours, none since   GERD (gastroesophageal reflux disease)    Had endoscopy no history of Barrett's   Hx of nonmelanoma skin cancer    Unspecified hereditary and idiopathic peripheral neuropathy     PAST SURGICAL HISTORY: Past Surgical History:  Procedure Laterality Date   ABDOMINAL HYSTERECTOMY  15 yrs ago   complete   BREAST CYST ASPIRATION     CHOLECYSTECTOMY  N/A 02/21/2014   Procedure: LAPAROSCOPIC CHOLECYSTECTOMY WITH INTRAOPERATIVE CHOLANGIOGRAM;  Surgeon: Doreen Salvage, MD;  Location: [facility];  Service: General;  Laterality: N/A;   ENDOSCOPIC RETROGRADE CHOLANGIOPANCREATOGRAPHY (ERCP) WITH PROPOFOL N/A 05/29/2014   Procedure: ENDOSCOPIC RETROGRADE CHOLANGIOPANCREATOGRAPHY (ERCP) WITH PROPOFOL;  Surgeon: Arta Silence, MD;  Location: [facility];  Service: Endoscopy;  Laterality: N/A;   ERCP N/A 02/24/2014   Procedure: ENDOSCOPIC RETROGRADE CHOLANGIOPANCREATOGRAPHY (ERCP);  Surgeon: Arta Silence, MD;  Location[facility] END[facility];  Service: Endoscopy;  Laterality: N/A;   EYE SURGERY Bilateral 2007   both eyes cataracts with lens replacments   LUMBAR LAMINECTOMY/DECOMPRESSION MICRODISCECTOMY  02/01/2012   Procedure: LUMBAR LAMINECTOMY/DECOMPRESSION MICRODISCECTOMY 2 LEVELS;  Surgeon: Kristeen Miss, MD;  Location: [facility];  Service: Neurosurgery;  Laterality: Left;  Left Lumbar Four-Five Lumbar Five-Sacral One Laminectomies/Foraminotomies/Microscope   SPYGLASS CHOLANGIOSCOPY N/A 05/29/2014   Procedure: SPYGLASS CHOLANGIOSCOPY;  Surgeon: Arta Silence, MD;  Location: [facility];  Service: Endoscopy;  Laterality: N/A;   TONSILLECTOMY  age 15    FAMILY HISTORY: Family History  Problem Relation Age of Onset   Neurodegenerative disease Mother        Shy Drager died at 53   Alcohol abuse Other    Heart disease Other     SOCIAL HISTORY: Social History   Socioeconomic History   Marital status: Widowed    Spouse name: Not on file   Number of children: 3   Years of education: 15 years   Highest education level: Associate degree: occupational, Hotel manager, or vocational program  Occupational History   Occupation: Retired    Comment: Therapist, sports  Tobacco Use   Smoking status: Former    Packs/day: 0.25    Years: 7.00    Pack years: 1.75    Types: Cigarettes    Quit date: 04/27/1963    Years since quitting: 57.7   Smokeless tobacco: Never  Substance and  Sexual Activity   Alcohol use: Yes    Alcohol/week: 7.0 standard drinks    Types: 7 Glasses of wine per week    Comment: 1 glass of wine daily   Drug use: No    Types: Hydrocodone   Sexual activity: Yes  Other Topics Concern   Not on file  Social History Narrative   Retired Therapist, sports    widowed since    Spring  17    [facility]   Regular exercise former smoker  G3P3   From [place] originally      05/12/2018:   Lives alone on ranch house, but has stairs to garage with "acorn chair" lift    Son in [place], and two daughters out of state, all of whom are supportive    Worked in NICU, 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 Misses working the floor.          Social Determinants of Health   Financial Resource Strain: Low Risk    Difficulty of Paying Living Expenses: Not hard at all  Food Insecurity: No Food Insecurity   Worried About Charity fundraiser in the Last Year: Never true   [place] in the Last Year: Never true  Transportation Needs: No Transportation Needs   Lack of Transportation (Medical): No   Lack of Transportation (Non-Medical): No  Physical Activity: Inactive   Days of Exercise per Week: 0 days   Minutes of Exercise per Session: 0 min  Stress: No Stress Concern Present   Feeling of Stress : Not at all  Social Connections: Moderately Isolated   Frequency of Communication with Friends and Family: More than three times a week   Frequency of Social Gatherings with Friends and Family: Three times a week   Attends Religious Services: More than 4 times per year   Active Member of Clubs or Organizations: No   Attends Archivist Meetings: Never   Marital Status: Widowed  Intimate Partner Violence: Not At Risk   Fear of Current or Ex-Partner: No   Emotionally Abused: No   Physically Abused: No   Sexually Abused: No   Marcial Pacas, M.D. Ph.D.  [facility] [place], [place] [address] Phone:  [phone] Fax:      [phone]

## 2021-01-20 LAB — TSH: TSH: 1.15 u[IU]/mL (ref 0.450–4.500)

## 2021-01-20 LAB — VITAMIN B12: Vitamin B-12: 1616 pg/mL — ABNORMAL HIGH (ref 232–1245)

## 2021-02-17 ENCOUNTER — Other Ambulatory Visit: Payer: Self-pay | Admitting: Neurology

## 2021-02-17 ENCOUNTER — Other Ambulatory Visit: Payer: Self-pay | Admitting: Internal Medicine

## 2021-03-02 ENCOUNTER — Other Ambulatory Visit: Payer: Self-pay | Admitting: Neurology

## 2021-03-02 MED ORDER — DONEPEZIL HCL 5 MG PO TABS: 5.0000 mg | ORAL_TABLET | Freq: Every day | ORAL | 0 refills | Status: DC

## 2021-03-02 NOTE — Telephone Encounter (Signed)
Pt states  makes her feel funny and dizzy. Pt would like a call to discuss going on another medication for her memory loss.

## 2021-03-02 NOTE — Telephone Encounter (Signed)
I spoke to the patient. She took donepezil 10mg , one tablet at bedtime for five days. Felt dizzy and fuzzy. She stopped it for one week then tried it again for another dose. Felt the same way. I suggested she take 5mg  at bedtime consistently for a few weeks. If well tolerated, increase the dose back to 10mg . She is agreeable to this plan She does not think she can half the small tablets and would like a 30-day supply of donepezil 5mg  sent to the pharmacy. She will let us know if she continues to have problems.

## 2021-03-05 ENCOUNTER — Other Ambulatory Visit: Payer: Self-pay | Admitting: Internal Medicine

## 2021-03-12 ENCOUNTER — Telehealth: Payer: Self-pay | Admitting: Neurology

## 2021-03-12 DIAGNOSIS — M5416 Radiculopathy, lumbar region: Secondary | ICD-10-CM | POA: Diagnosis not present

## 2021-03-12 DIAGNOSIS — M961 Postlaminectomy syndrome, not elsewhere classified: Secondary | ICD-10-CM | POA: Diagnosis not present

## 2021-03-12 DIAGNOSIS — M5136 Other intervertebral disc degeneration, lumbar region: Secondary | ICD-10-CM | POA: Diagnosis not present

## 2021-03-12 DIAGNOSIS — M533 Sacrococcygeal disorders, not elsewhere classified: Secondary | ICD-10-CM | POA: Diagnosis not present

## 2021-03-12 DIAGNOSIS — I10 Essential (primary) hypertension: Secondary | ICD-10-CM | POA: Diagnosis not present

## 2021-03-12 DIAGNOSIS — M47816 Spondylosis without myelopathy or radiculopathy, lumbar region: Secondary | ICD-10-CM | POA: Diagnosis not present

## 2021-03-12 DIAGNOSIS — M461 Sacroiliitis, not elsewhere classified: Secondary | ICD-10-CM | POA: Diagnosis not present

## 2021-03-12 NOTE — Telephone Encounter (Signed)
Pt has called back stating she will have to leave home but she wants the RN to call back with a response to pt wanting to take the donepezil (ARICEPT) 10 MG tablet since the 5 mg has her feeling as if she needs more.  Please leave a message if pt is not available

## 2021-03-12 NOTE — Telephone Encounter (Signed)
I called and left a message for the patient. She may restart the donepezil 10mg , one tab QHS. She has refills at the pharmacy. I left this detailed message on her voicemail. Provided our number and office hours to call back with any questions.

## 2021-03-12 NOTE — Telephone Encounter (Signed)
Pt called wanting to know if she can restart her memory loss medication again starting tonight. Pt did not have the name of it. Please advise.

## 2021-03-17 NOTE — Telephone Encounter (Addendum)
The patient called our office again. Says she just does not feel well after increasing the donepezil back up to 10mg  at bedtime (only tried three doses). She is going to half the dose back to 5mg  tonight then stop the medication.  Memantine caused a rash. She would like to try an alternate medication.. She is aware we will discuss this w/ Dr. Krista Blue and call her back.   She also informed me that she has a resting heart rate of 120 today. This should not be related to donepezil. I ask her to contact her PCP to report this symptom.

## 2021-03-18 NOTE — Telephone Encounter (Signed)
Per vo by Dr. Krista Blue, she does not think the patient should start another medication at this time. She agreed the tachycardia is unrelated to the donepezil. She may have something else going on that is making her feel bad.   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 She agrees with this plan and will see PCP. She is adamant about staying on medication. She would like to continue donepezil 5mg  at bedtime. In a few weeks, she my attempt to increase to 10mg  again. She will keep our office informed.

## 2021-03-30 ENCOUNTER — Telehealth: Payer: Self-pay | Admitting: Neurology

## 2021-03-30 NOTE — Telephone Encounter (Signed)
I spoke to the patient. She is concerned about her progressive memory loss. Right now, she is only taking donepezil 5mg , once daily. She is going to try to increase her dose back up to 10mg , one tablet daily. She will call us back for further concerns about this problem.  She is also having increased neuropathic pain in her bilateral legs. She is currently taking gabapentin 600mg , one tablets QID. She wants to know if the dose can be increased  She has already failed duloxetine, pregabalin and nortriptyline in the past.

## 2021-03-30 NOTE — Telephone Encounter (Signed)
Previously she has tried Lyrica which caused abdominal cramps,  Cymbalta, caused nausea. She had side effects from nortriptyline 20 mg per night.

## 2021-03-30 NOTE — Telephone Encounter (Signed)
Agree higher dose of Aricept 10 mg daily  I would not suggest higher dose of gabapentin, I would suggest.her stop Lyrica, which is in the same category as gabapentin, keep Cymbalta or nortriptyline  Higher dose of polypharmacy would increase her complaints of mental fogginess, memory loss

## 2021-03-30 NOTE — Telephone Encounter (Signed)
Pt called wanting to know what other medications can she take. Pt requesting a call back.

## 2021-03-31 MED ORDER — LAMOTRIGINE 25 MG PO TABS: ORAL_TABLET | ORAL | 11 refills | Status: DC

## 2021-03-31 NOTE — Telephone Encounter (Signed)
Meds ordered this encounter  Medications   lamoTRIgine (LAMICTAL) 25 MG tablet    Sig: One tab bid xone week, 2 tabs bid    Dispense:  120 tablet    Refill:  11     I have E faxed lamotrigine 25 mg tablets, titrating to 2 tablets twice a day, warn her about the side effect of rash, if she develops a rash she should stop taking the medication, drink plenty water, and to call our office right away

## 2021-03-31 NOTE — Addendum Note (Signed)
Addended by: Marcial Pacas on: 03/31/2021 10:00 AM   Modules accepted: Orders

## 2021-03-31 NOTE — Telephone Encounter (Signed)
Patient called back, I reviewed Dr. Rhea Belton plan with her and advised her to contact us if she has any trouble with the new medication. Patient voiced understanding and did not have any additional questions at this time. She is aware that the Rx has been sent to the pharmacy and states that she will pick it up tomorrow.

## 2021-03-31 NOTE — Telephone Encounter (Addendum)
Left message for a return call.   Per vo by Dr. Krista Blue, she may add the lamotrigrine as prescribed. She should continue the gabapentin. If her neuropathic pain starts to improve, she may be able to back off the gabapentin dosage at a later time. We need to review this plan with the patient.  The original rx printed. It has been signed by Dr. Krista Blue, faxed and confirmed to [facility] on [address] [place].

## 2021-04-02 DIAGNOSIS — M961 Postlaminectomy syndrome, not elsewhere classified: Secondary | ICD-10-CM | POA: Diagnosis not present

## 2021-04-09 ENCOUNTER — Telehealth: Payer: Self-pay | Admitting: Internal Medicine

## 2021-04-09 NOTE — Telephone Encounter (Signed)
Pt is calling an dr Susan Ford prescribed cymbalta  and medication caused her unsteadiness and pt reached back to dr Susan Ford and per pt he wanted to switch her to another medication. Pt is requesting to see dr Susan Ford to discuss. Pt decline to speak with access nurse. Pt is aware md out of office

## 2021-04-10 NOTE — Telephone Encounter (Signed)
Left a message for the pt to return my call.  

## 2021-04-10 NOTE — Telephone Encounter (Signed)
Pt added to virtual schedule on 12/19

## 2021-04-13 ENCOUNTER — Encounter: Payer: Self-pay | Admitting: Internal Medicine

## 2021-04-13 ENCOUNTER — Telehealth (INDEPENDENT_AMBULATORY_CARE_PROVIDER_SITE_OTHER): Payer: Medicare Other | Admitting: Internal Medicine

## 2021-04-13 DIAGNOSIS — G3184 Mild cognitive impairment, so stated: Secondary | ICD-10-CM

## 2021-04-13 DIAGNOSIS — Z79899 Other long term (current) drug therapy: Secondary | ICD-10-CM | POA: Diagnosis not present

## 2021-04-13 DIAGNOSIS — G8929 Other chronic pain: Secondary | ICD-10-CM

## 2021-04-13 DIAGNOSIS — M48061 Spinal stenosis, lumbar region without neurogenic claudication: Secondary | ICD-10-CM | POA: Diagnosis not present

## 2021-04-13 DIAGNOSIS — R450 Nervousness: Secondary | ICD-10-CM | POA: Diagnosis not present

## 2021-04-13 DIAGNOSIS — M961 Postlaminectomy syndrome, not elsewhere classified: Secondary | ICD-10-CM | POA: Diagnosis not present

## 2021-04-13 DIAGNOSIS — G609 Hereditary and idiopathic neuropathy, unspecified: Secondary | ICD-10-CM | POA: Diagnosis not present

## 2021-04-13 DIAGNOSIS — R030 Elevated blood-pressure reading, without diagnosis of hypertension: Secondary | ICD-10-CM | POA: Diagnosis not present

## 2021-04-13 DIAGNOSIS — M545 Low back pain, unspecified: Secondary | ICD-10-CM

## 2021-04-13 NOTE — Progress Notes (Signed)
Virtual Visit via Telephone Note  I connected with@ on 04/13/21 at  4:00 PM EST by telephone and verified that I am speaking with the correct person using two identifiers.   I discussed the limitations, risks, security and privacy concerns of performing an evaluation and management service by telephone and the limited availability of in person appointments. tThere may be a patient responsible charge related to this service. The patient expressed understanding and agreed to proceed.  Location patient: home Location provider: work office Participants present for the call: patient, provider Patient did not have a visit in the prior 7 days to address this/these issue(s).   History of Present Illness: Susan Ford  Presents with a few weeks of complaints of feeling very jittery inside without any tremor.  Denies any specific change in medicine Dr. Assunta Curtis gave her low-dose Cymbalta a while back but she only took 1 dose and felt zonked out so stopped it. She is not really winded but when she gets up and moves around will sometimes feel this jitteriness but does not go away when she rests. She is not taking the antihistamine on the med list nor the Lamictal.  She is taking Celebrex 2 times a day and Aricept. Current falling or new neurologic symptoms. Living by herself but going out of town with her sister to [place] for the holidays.   Observations/Objective: Patient sounds personable and well on the phone. I do not appreciate any SOB. Speech and thought processing are grossly intact. Patient reported vitals: Lab Results  Component Value Date   WBC 7.4 02/06/2020   HGB 13.4 02/06/2020   HCT 40.0 02/06/2020   PLT 260 02/06/2020   GLUCOSE 99 02/06/2020   CHOL 167 02/06/2020   TRIG 153 (H) 02/06/2020   HDL 71 02/06/2020   LDLDIRECT 153 02/12/2009   LDLCALC 72 02/06/2020   ALT 10 02/06/2020   AST 17 02/06/2020   NA 134 (L) 02/06/2020   K 4.5 02/06/2020   CL 100 02/06/2020    CREATININE 0.62 02/06/2020   BUN 10 02/06/2020   CO2 25 02/06/2020   TSH 1.150 01/19/2021   INR 0.91 07/05/2017   HGBA1C 5.4 02/06/2020    Assessment and Plan:  Jittery feeling - Plan: Basic metabolic panel, CBC with Differential/Platelet, Hemoglobin A1c, Hepatic function panel, TSH, T4, free, Sedimentation rate, C-reactive protein  Medication management - Plan: Basic metabolic panel, CBC with Differential/Platelet, Hemoglobin A1c, Hepatic function panel, TSH, T4, free, Sedimentation rate, C-reactive protein  Hereditary and idiopathic peripheral neuropathy - Plan: Basic metabolic panel, CBC with Differential/Platelet, Hemoglobin A1c, Hepatic function panel, TSH, T4, free, Sedimentation rate, C-reactive protein  Spinal stenosis of lumbar region, unspecified whether neurogenic claudication present - Plan: Basic metabolic panel, CBC with Differential/Platelet, Hemoglobin A1c, Hepatic function panel, TSH, T4, free, Sedimentation rate, C-reactive protein  Mild cognitive impairment - Plan: Basic metabolic panel, CBC with Differential/Platelet, Hemoglobin A1c, Hepatic function panel, TSH, T4, free, Sedimentation rate, C-reactive protein  Chronic bilateral low back pain without sciatica - Plan: Basic metabolic panel, CBC with Differential/Platelet, Hemoglobin A1c, Hepatic function panel, TSH, T4, free, Sedimentation rate, C-reactive protein  Follow Up Instructions: Ok to hold the vit b12 for now  Get lab appt   Suspect medication se but unclear at this time  no change in meds . Gabapentin and aricept not taking antihistamine or Lamictal.  Record review  She can check for rapid heart rate but does not really sound like tachycardia. Somewhat confusing as the medicines  on her list she is not taking at this time.  Reviewed during the visit. Seems like she is taking Celebrex Aricept estradiol gabapentin and simvastatin.  12,000 international units a day and she will try stopping the vitamin B12 for  now since her levels have been high.  Plan lab appointment when convenient for her rule out metabolic.  Otherwise she would need a follow-up visit in person.  [id number] [id number] [id number] [id number] [id number] 21-30 I did not refer this patient for an OV in the next 24 hours for this/these issue(s).  I discussed the assessment and treatment plan with the patient. The patient was provided an opportunity to ask questions and answered. The patient agreed with the plan and demonstrated an understanding of the instructions.   The patient was advised to call back or seek an in-person evaluation if the symptoms worsen or if the condition fails to improve as anticipated.  I provided 22 minutes of non-face-to-face time during this encounter. No follow-ups on file.  Shanon Ace, MD   UPDATE Sept 26 2022: [name] She lives alone, she is still driving, today her main concern is increased bilateral lower extremity weakness, even after standing or walking short distance, she would feel bilateral lower extremity heaviness, weak, but once she sits down, or lying down, she is not in pain, she denies bowel and bladder incontinence, she does has worsening gait abnormality   Recently was seen by Dr. 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

## 2021-04-29 ENCOUNTER — Telehealth: Payer: Self-pay | Admitting: Neurology

## 2021-04-29 NOTE — Telephone Encounter (Signed)
Pt is asking for a call from [place], [place].  Pt states her medication for memory is not working.

## 2021-04-30 NOTE — Telephone Encounter (Signed)
I left a message asking for a return call. She may speak to anyone available in POD 2.   History: She is seen here for dementia. Last visit on 01/19/21 with MOCA score on 18/30.  Rash with memantine. She has reported tolerability issues with donepezil. We need to clarify if she is still taking it and at which dosage.   Also, need to inquire about her expectations of these types of medications. Unfortunately, they do not cure dementia. Only hves the potential to slow the progression of symptoms.

## 2021-04-30 NOTE — Telephone Encounter (Signed)
I spoke to the patient. She is tolerating donepezil 10mg  at bedtime without any adverse side effects. States her memory is progressively getting worse and has not improved. She wants a medication to make it better. I did discuss with her about realistic expectations from what the medication can actually do, especially in combination with aging. She verbalized understanding that it only has the potential to slow the progression of memory loss down. She would like to continue the medication. I also encouraged her to stay well hydrated and to keep her brain active by completing puzzles/games (sudoku, crosswords, etc). She is agreeable to this plan. Says she already does some crossword puzzles.

## 2021-05-05 ENCOUNTER — Ambulatory Visit (INDEPENDENT_AMBULATORY_CARE_PROVIDER_SITE_OTHER): Payer: Medicare Other | Admitting: Internal Medicine

## 2021-05-05 ENCOUNTER — Encounter: Payer: Self-pay | Admitting: Internal Medicine

## 2021-05-05 ENCOUNTER — Other Ambulatory Visit: Payer: Self-pay

## 2021-05-05 ENCOUNTER — Ambulatory Visit (INDEPENDENT_AMBULATORY_CARE_PROVIDER_SITE_OTHER): Payer: Medicare Other

## 2021-05-05 VITALS — BP 100/60 | HR 84 | Temp 98.6°F | Ht 64.0 in | Wt 120.0 lb

## 2021-05-05 DIAGNOSIS — G3184 Mild cognitive impairment, so stated: Secondary | ICD-10-CM

## 2021-05-05 DIAGNOSIS — R918 Other nonspecific abnormal finding of lung field: Secondary | ICD-10-CM | POA: Diagnosis not present

## 2021-05-05 DIAGNOSIS — R450 Nervousness: Secondary | ICD-10-CM

## 2021-05-05 DIAGNOSIS — M545 Low back pain, unspecified: Secondary | ICD-10-CM | POA: Diagnosis not present

## 2021-05-05 DIAGNOSIS — R002 Palpitations: Secondary | ICD-10-CM | POA: Diagnosis not present

## 2021-05-05 DIAGNOSIS — E785 Hyperlipidemia, unspecified: Secondary | ICD-10-CM

## 2021-05-05 DIAGNOSIS — G8929 Other chronic pain: Secondary | ICD-10-CM

## 2021-05-05 DIAGNOSIS — G609 Hereditary and idiopathic neuropathy, unspecified: Secondary | ICD-10-CM

## 2021-05-05 DIAGNOSIS — T300 Burn of unspecified body region, unspecified degree: Secondary | ICD-10-CM

## 2021-05-05 DIAGNOSIS — Z79899 Other long term (current) drug therapy: Secondary | ICD-10-CM | POA: Diagnosis not present

## 2021-05-05 DIAGNOSIS — R269 Unspecified abnormalities of gait and mobility: Secondary | ICD-10-CM | POA: Diagnosis not present

## 2021-05-05 DIAGNOSIS — M4854XA Collapsed vertebra, not elsewhere classified, thoracic region, initial encounter for fracture: Secondary | ICD-10-CM | POA: Diagnosis not present

## 2021-05-05 DIAGNOSIS — R3989 Other symptoms and signs involving the genitourinary system: Secondary | ICD-10-CM | POA: Diagnosis not present

## 2021-05-05 LAB — TSH: TSH: 1.42 u[IU]/mL (ref 0.35–5.50)

## 2021-05-05 LAB — HEPATIC FUNCTION PANEL
ALT: 14 U/L (ref 0–35)
AST: 23 U/L (ref 0–37)
Albumin: 4.2 g/dL (ref 3.5–5.2)
Alkaline Phosphatase: 63 U/L (ref 39–117)
Bilirubin, Direct: 0.1 mg/dL (ref 0.0–0.3)
Total Bilirubin: 0.4 mg/dL (ref 0.2–1.2)
Total Protein: 7.2 g/dL (ref 6.0–8.3)

## 2021-05-05 LAB — POCT URINALYSIS DIPSTICK
Bilirubin, UA: NEGATIVE
Blood, UA: NEGATIVE
Glucose, UA: NEGATIVE
Ketones, UA: NEGATIVE
Leukocytes, UA: NEGATIVE
Nitrite, UA: POSITIVE
Spec Grav, UA: >=1.03 — AB (ref 1.010–1.025)
pH, UA: 6 (ref 5.0–8.0)

## 2021-05-05 LAB — CBC WITH DIFFERENTIAL/PLATELET
Basophils Absolute: 0.1 10*3/uL (ref 0.0–0.1)
Basophils Relative: 1.2 % (ref 0.0–3.0)
Eosinophils Absolute: 0.8 10*3/uL — ABNORMAL HIGH (ref 0.0–0.7)
Eosinophils Relative: 7.9 % — ABNORMAL HIGH (ref 0.0–5.0)
HCT: 37.9 % (ref 36.0–46.0)
Hemoglobin: 12.5 g/dL (ref 12.0–15.0)
Lymphocytes Relative: 15.3 % (ref 12.0–46.0)
Lymphs Abs: 1.6 10*3/uL (ref 0.7–4.0)
MCHC: 33 g/dL (ref 30.0–36.0)
MCV: 96.4 fl (ref 78.0–100.0)
Monocytes Absolute: 0.6 10*3/uL (ref 0.1–1.0)
Monocytes Relative: 6 % (ref 3.0–12.0)
Neutro Abs: 7.4 10*3/uL (ref 1.4–7.7)
Neutrophils Relative %: 69.6 % (ref 43.0–77.0)
Platelets: 295 10*3/uL (ref 150.0–400.0)
RBC: 3.93 Mil/uL (ref 3.87–5.11)
RDW: 13.2 % (ref 11.5–15.5)
WBC: 10.6 10*3/uL — ABNORMAL HIGH (ref 4.0–10.5)

## 2021-05-05 LAB — BASIC METABOLIC PANEL
BUN: 16 mg/dL (ref 6–23)
CO2: 27 mEq/L (ref 19–32)
Calcium: 9 mg/dL (ref 8.4–10.5)
Chloride: 100 mEq/L (ref 96–112)
Creatinine, Ser: 0.73 mg/dL (ref 0.40–1.20)
GFR: 72.69 mL/min (ref >60.00)
Glucose, Bld: 108 mg/dL — ABNORMAL HIGH (ref 70–99)
Potassium: 4.3 mEq/L (ref 3.5–5.1)
Sodium: 133 mEq/L — ABNORMAL LOW (ref 135–145)

## 2021-05-05 LAB — HEMOGLOBIN A1C: Hgb A1c MFr Bld: 5.5 % (ref 4.6–6.5)

## 2021-05-05 LAB — LIPID PANEL
Cholesterol: 150 mg/dL (ref 0–200)
HDL: 64.3 mg/dL (ref >39.00)
LDL Cholesterol: 48 mg/dL (ref 0–99)
NonHDL: 85.79
Total CHOL/HDL Ratio: 2
Triglycerides: 191 mg/dL — ABNORMAL HIGH (ref 0.0–149.0)
VLDL: 38.2 mg/dL (ref 0.0–40.0)

## 2021-05-05 LAB — T4, FREE: Free T4: 0.71 ng/dL (ref 0.60–1.60)

## 2021-05-05 NOTE — Patient Instructions (Addendum)
Lab today  checking for anemia thyroid blood sugar . Kidney etc  Will plan follow up depending on results   Stay hydrated .  Consider heart monitor echo tests  and /or  seeing cardiology.     but your ekg is normal .  At this time would not  increase the gabapentin because can add to mental fogginess and nto a cure ( but a treatment for symptoms of neuropathy)   There is still evidence of heat damage to skin on back . Would stop the heat until skin is less red .  Limit heat  to 30 minutes at a time  every few hours and not directly  in skin.  Do  not sleep with heating pad   Plan follow up  depending  Heat Therapy Heat therapy can help ease sore, stiff, injured, and tight muscles and joints. Heat relaxes your muscles. This may help ease your pain and muscle spasms. What are the risks? If you have any of the following conditions, do not use heat therapy unless your doctor says it is okay. These conditions include: New bruises. Open wounds. Any of these in the area being treated: Healing wounds. Infected skin. Scarred skin. Problems with how blood moves through your body (circulation). Loss of feeling (numbness) in the part of your body that is being treated. Unusual swelling of the part of the body that is being treated. Blood clots. Diabetes. Heart disease. Cancer. Not being able to communicate pain. This may include young children and people who have problems with their brain function (dementia). How to use heat therapy There are different kinds of heat therapy. These include: Moist heat pack. Hot water bottle. Electric heating pad. Heated gel pack. Heated wrap. Warm water bath. Your doctor will tell you how to use heat therapy. In general, you should: Place a towel between your skin and the heat source. Leave the heat on for 20-30 minutes. Your skin may turn pink. Take off the heat if your skin turns bright red. This is very important. If you cannot feel pain, heat, or  cold, you have a greater risk of getting burned. Your doctor may also tell you to take a warm water bath. To do this: Put a non-slip pad in the bathtub to prevent a fall. Fill the bathtub with warm water. Check the water temperature. Soak in the water for 15-20 minutes, or as told by your doctor. Be careful when you stand up after the bath. You may feel dizzy. Pat yourself dry after the bath. Do not rub your skin to dry it. General recommendations for heat therapy Be careful not to burn your skin when using heat therapy. High heat or using heat for a long time can cause burns. Do not sleep while using heat therapy. Only use heat therapy while you are awake. Check your skin during heat therapy. Do not use heat therapy if you have a new injury, especially if you have swelling on the injured area. Do not use heat therapy on areas of your skin that are already irritated, such as with a rash or sunburn. Do not use heat therapy if your skin turns bright red. Contact a doctor if: You have blisters, redness, swelling, or loss of feeling in the area where you use heat therapy. You have new pain. You have pain that gets worse. Summary Heat therapy is the use of heat to help ease sore, stiff, injured, and tight muscles and joints. There are different types of  heat therapy. Your doctor will tell you which one to use. Only use heat therapy while you are awake. Watch your skin to make sure you do not get burned while using heat therapy. This information is not intended to replace advice given to you by your health care provider. Make sure you discuss any questions you have with your health care provider. Document Revised: 02/13/2020 Document Reviewed: 02/13/2020 Elsevier Patient Education  2022 [employer].

## 2021-05-05 NOTE — Progress Notes (Signed)
Chief Complaint  Patient presents with   Ankle Pain    Heavy breathing    HPI: Susan Ford 88 y.o. come in for routine and over the last number of weeks feels that her heart is racing when she gets up and moves around wonders if it could be A. fib there is no syncope she just feels tired and no chest pain cough or fever.  She mentions the word atrial fibrillation and asked if it could be that she has no history of same per his per questioning.  She also feels like she is jittery since she has been on the Aricept 5 mg. Last MOCA 18/30 and gait  issues  has small vessel disease  Asks if she can increase the gabapentin for her neuropathy where she feels the numbness up to mid calf.  No increase in pain. Uses a heating pad pretty much all the time she is awake sitting in her chair watching TV and movies.  She also will use the over-the-counter patch.  Last visit with me was  June 22    I does limit fluids a bit because she states she urinates frequently.  Denies any UTI symptoms.    She has been placed on  aricept for dementia   per [name] Sacroiliitis and back pain  seen  sept 22   In regard to memory loss she and her family are going to talk in the near future as this point she lives alone. Occasions include Celebrex Aricept 5 mg low-dose Climara patch once a week Neurontin 600 mg 4 times a day and simvastatin 10 mg. ROS: See pertinent positives and negatives per HPI.  Past Medical History:  Diagnosis Date   Arthritis    Cataract 2007   bilateral cat ext    Cholecystitis    Complication of anesthesia Feb 21, 2014   atrial fib after cholecystectomy, lasted a few hours, none since   Dysrhythmia 02-21-2014   after cholecystectomy had atrial fib for a few hours, none since   GERD (gastroesophageal reflux disease)    Had endoscopy no history of Barrett's   Hx of nonmelanoma skin cancer    Unspecified hereditary and idiopathic peripheral neuropathy     Family History   Problem Relation Age of Onset   Neurodegenerative disease Mother        Shy Drager died at 59   Alcohol abuse Other    Heart disease Other     Social History   Socioeconomic History   Marital status: Widowed    Spouse name: Not on file   Number of children: 3   Years of education: 15 years   Highest education level: Associate degree: occupational, Hotel manager, or vocational program  Occupational History   Occupation: Retired    Comment: Therapist, sports  Tobacco Use   Smoking status: Former    Packs/day: 0.25    Years: 7.00    Pack years: 1.75    Types: Cigarettes    Quit date: 04/27/1963    Years since quitting: 58.0   Smokeless tobacco: Never  Substance and Sexual Activity   Alcohol use: Yes    Alcohol/week: 7.0 standard drinks    Types: 7 Glasses of wine per week    Comment: 1 glass of wine daily   Drug use: No    Types: Hydrocodone   Sexual activity: Yes  Other Topics Concern   Not on file  Social History Narrative   Retired Therapist, sports  widowed since    Spring  17    [facility] of 1   Regular exercise former smoker   G3P3   From [place] originally      05/12/2018:   Lives alone on ranch house, but has stairs to garage with "acorn chair" lift    Son in [place], and two daughters out of state, all of whom are supportive    Worked in NICU, 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 Misses working the floor.          Social Determinants of Health   Financial Resource Strain: Low Risk    Difficulty of Paying Living Expenses: Not hard at all  Food Insecurity: No Food Insecurity   Worried About Charity fundraiser in the Last Year: Never true   Holiday in the Last Year: Never true  Transportation Needs: No Transportation Needs   Lack of Transportation (Medical): No   Lack of Transportation (Non-Medical): No  Physical Activity: Inactive   Days of Exercise per Week: 0 days   Minutes of Exercise per Session: 0 min  Stress: No Stress Concern Present   Feeling  of Stress : Not at all  Social Connections: Moderately Isolated   Frequency of Communication with Friends and Family: More than three times a week   Frequency of Social Gatherings with Friends and Family: Three times a week   Attends Religious Services: More than 4 times per year   Active Member of Clubs or Organizations: No   Attends Archivist Meetings: Never   Marital Status: Widowed    Outpatient Medications Prior to Visit  Medication Sig Dispense Refill   acetaminophen (TYLENOL) 500 MG tablet Take 500 mg by mouth every 6 (six) hours as needed for mild pain.      celecoxib (CELEBREX) 200 MG capsule Take 1 capsule (200 mg total) by mouth daily. 90 capsule 0   donepezil (ARICEPT) 5 MG tablet Take 1 tablet (5 mg total) by mouth at bedtime. 30 tablet 0   estradiol (CLIMARA - DOSED IN MG/24 HR) 0.025 mg/24hr patch PLACE 1 PATCH (0.025 MG TOTAL) ONTO THE SKIN ONCE A WEEK ON SATURDAY 8 patch 1   gabapentin (NEURONTIN) 600 MG tablet TAKE 1 TABLET BY MOUTH 4 TIMES DAILY 360 tablet 3   Multiple Vitamin (MULTIVITAMIN) tablet Take 1 tablet by mouth daily after lunch.     simvastatin (ZOCOR) 10 MG tablet TAKE 1 TABLET BY MOUTH EVERYDAY AT BEDTIME 90 tablet 1   donepezil (ARICEPT) 10 MG tablet Take 1 tablet (10 mg total) by mouth at bedtime. (Patient not taking: Reported on 05/05/2021) 30 tablet 11   lamoTRIgine (LAMICTAL) 25 MG tablet One tab bid xone week, 2 tabs bid (Patient not taking: Reported on 05/05/2021) 120 tablet 11   levocetirizine (XYZAL) 5 MG tablet Take 10 mg by mouth at bedtime. (Patient not taking: Reported on 05/05/2021)     No facility-administered medications prior to visit.     EXAM:  BP 100/60 (BP Location: Left Arm, Patient Position: Sitting, Cuff Size: Normal)    Pulse 84    Temp 98.6 F (37 C) (Oral)    Ht 5\' 4"  (1.626 m)    Wt 120 lb (54.4 kg)    SpO2 98%    BMI 20.60 kg/m   Body mass index is 20.6 kg/m.  GENERAL: vitals reviewed and listed above, alert,  oriented, appears well hydrated and in no acute distress she has a  normal conversation walks flat-footed with a cane but pretty easily.  Leans forward. HEENT: atraumatic, conjunctiva  clear, no obvious abnormalities on inspection of external nose and ears OP : masked  NECK: no obvious masses on inspection palpation  LUNGS: clear to auscultation bilaterally, no wheezes, rales or rhonchi, CV: HRRR, no clubbing cyanosis or  peripheral edema nl cap refill  Skin on lower back with no ulcerations or blisters but erythema that is somewhat mottled nontender thickened consistent with heat changes MS: moves all extremities without noticeable focal  abnormality arthritic changes Lower extremity foot without significant swelling no ulcer seen pulses present digital pigmentation from previous injury right lower extremity shin. PSYCH: pleasant and cooperative, no obvious depression or anxiety Lab Results  Component Value Date   WBC 7.4 02/06/2020   HGB 13.4 02/06/2020   HCT 40.0 02/06/2020   PLT 260 02/06/2020   GLUCOSE 99 02/06/2020   CHOL 167 02/06/2020   TRIG 153 (H) 02/06/2020   HDL 71 02/06/2020   LDLDIRECT 153 02/12/2009   LDLCALC 72 02/06/2020   ALT 10 02/06/2020   AST 17 02/06/2020   NA 134 (L) 02/06/2020   K 4.5 02/06/2020   CL 100 02/06/2020   CREATININE 0.62 02/06/2020   BUN 10 02/06/2020   CO2 25 02/06/2020   TSH 1.150 01/19/2021   INR 0.91 07/05/2017   HGBA1C 5.4 02/06/2020   BP Readings from Last 3 Encounters:  05/05/21 100/60  01/19/21 122/62  10/14/20 126/70   Lab Results  Component Value Date   VITAMINB12 1,616 (H) 01/19/2021   EKG shows no acute findings normal sinus rhythm rate 64 normal intervals. ASSESSMENT AND PLAN:  Discussed the following assessment and plan:  Palpitations - on exertion sounds like exercise intolerance. - Plan: EKG 12-Lead, DG Chest 2 View  Jittery feeling - Plan: Basic metabolic panel, CBC with Differential/Platelet, Hemoglobin A1c, Hepatic  function panel, Lipid panel, TSH, T4, free, T4, free, TSH, Lipid panel, Hepatic function panel, Hemoglobin A1c, CBC with Differential/Platelet, Basic metabolic panel  Hereditary and idiopathic peripheral neuropathy - Plan: Basic metabolic panel, CBC with Differential/Platelet, Hemoglobin A1c, Hepatic function panel, Lipid panel, TSH, T4, free, T4, free, TSH, Lipid panel, Hepatic function panel, Hemoglobin A1c, CBC with Differential/Platelet, Basic metabolic panel  Medication management - Plan: Basic metabolic panel, CBC with Differential/Platelet, Hemoglobin A1c, Hepatic function panel, Lipid panel, TSH, T4, free, T4, free, TSH, Lipid panel, Hepatic function panel, Hemoglobin A1c, CBC with Differential/Platelet, Basic metabolic panel  Mild cognitive impairment - Plan: Basic metabolic panel, CBC with Differential/Platelet, Hemoglobin A1c, Hepatic function panel, Lipid panel, TSH, T4, free, T4, free, TSH, Lipid panel, Hepatic function panel, Hemoglobin A1c, CBC with Differential/Platelet, Basic metabolic panel  Current use of estrogen therapy - Plan: Basic metabolic panel, CBC with Differential/Platelet, Hemoglobin A1c, Hepatic function panel, Lipid panel, TSH, T4, free, T4, free, TSH, Lipid panel, Hepatic function panel, Hemoglobin A1c, CBC with Differential/Platelet, Basic metabolic panel  Hyperlipidemia, unspecified hyperlipidemia type - Plan: Basic metabolic panel, CBC with Differential/Platelet, Hemoglobin A1c, Hepatic function panel, Lipid panel, TSH, T4, free, T4, free, TSH, Lipid panel, Hepatic function panel, Hemoglobin A1c, CBC with Differential/Platelet, Basic metabolic panel  Suspected UTI - Plan: POC Urinalysis Dipstick, Culture, Urine, Culture, Urine  Thermal injury - lower back disc avoiding prolonged heat let heal and then only 30 mibn at a time  Gait abnormality  Chronic bilateral low back pain without sciatica Update labs r/o metabolic  ua cx  c xray  At this point would not  want  to increase her gabapentin with the question of talkative decline to avoid mental fogginess and that gabapentin is not a disease altering but more for symptoms. If any kind of heart racing fatigue gets worse seek emergent care. In interim consider cardiology consult and or echo monitor evaluation. Agree she should have input about changing her living arrangements for future care monitoring. [facility] of record  says she may have had a fib after surgery short lived  remote hx  -Patient advised to return or notify health care team  if  new concerns arise. [facility] eval med address and  exam counsel  45 minutes  Patient Instructions  Lab today  checking for anemia thyroid blood sugar . Kidney etc  Will plan follow up depending on results   Stay hydrated .  Consider heart monitor echo tests  and /or  seeing cardiology.     but your ekg is normal .  At this time would not  increase the gabapentin because can add to mental fogginess and nto a cure ( but a treatment for symptoms of neuropathy)   There is still evidence of heat damage to skin on back . Would stop the heat until skin is less red .  Limit heat  to 30 minutes at a time  every few hours and not directly  in skin.  Do  not sleep with heating pad   Plan follow up  depending  Heat Therapy Heat therapy can help ease sore, stiff, injured, and tight muscles and joints. Heat relaxes your muscles. This may help ease your pain and muscle spasms. What are the risks? If you have any of the following conditions, do not use heat therapy unless your doctor says it is okay. These conditions include: New bruises. Open wounds. Any of these in the area being treated: Healing wounds. Infected skin. Scarred skin. Problems with how blood moves through your body (circulation). Loss of feeling (numbness) in the part of your body that is being treated. Unusual swelling of the part of the body that is being treated. Blood clots. Diabetes. Heart  disease. Cancer. Not being able to communicate pain. This may include young children and people who have problems with their brain function (dementia). How to use heat therapy There are different kinds of heat therapy. These include: Moist heat pack. Hot water bottle. Electric heating pad. Heated gel pack. Heated wrap. Warm water bath. Your doctor will tell you how to use heat therapy. In general, you should: Place a towel between your skin and the heat source. Leave the heat on for 20-30 minutes. Your skin may turn pink. Take off the heat if your skin turns bright red. This is very important. If you cannot feel pain, heat, or cold, you have a greater risk of getting burned. Your doctor may also tell you to take a warm water bath. To do this: Put a non-slip pad in the bathtub to prevent a fall. Fill the bathtub with warm water. Check the water temperature. Soak in the water for 15-20 minutes, or as told by your doctor. Be careful when you stand up after the bath. You may feel dizzy. Pat yourself dry after the bath. Do not rub your skin to dry it. General recommendations for heat therapy Be careful not to burn your skin when using heat therapy. High heat or using heat for a long time can cause burns. Do not sleep while using heat therapy. Only use heat therapy while you are awake.  Check your skin during heat therapy. Do not use heat therapy if you have a new injury, especially if you have swelling on the injured area. Do not use heat therapy on areas of your skin that are already irritated, such as with a rash or sunburn. Do not use heat therapy if your skin turns bright red. Contact a doctor if: You have blisters, redness, swelling, or loss of feeling in the area where you use heat therapy. You have new pain. You have pain that gets worse. Summary Heat therapy is the use of heat to help ease sore, stiff, injured, and tight muscles and joints. There are different types of heat  therapy. Your doctor will tell you which one to use. Only use heat therapy while you are awake. Watch your skin to make sure you do not get burned while using heat therapy. This information is not intended to replace advice given to you by your health care provider. Make sure you discuss any questions you have with your health care provider. Document Revised: 02/13/2020 Document Reviewed: 02/13/2020 Elsevier Patient Education  2022 [place] Elizabet Schweppe M.D.

## 2021-05-07 DIAGNOSIS — M961 Postlaminectomy syndrome, not elsewhere classified: Secondary | ICD-10-CM | POA: Diagnosis not present

## 2021-05-07 DIAGNOSIS — Z79899 Other long term (current) drug therapy: Secondary | ICD-10-CM | POA: Diagnosis not present

## 2021-05-07 LAB — URINE CULTURE
MICRO NUMBER:: 12850778
SPECIMEN QUALITY:: ADEQUATE

## 2021-05-08 ENCOUNTER — Other Ambulatory Visit: Payer: Self-pay | Admitting: Internal Medicine

## 2021-05-08 MED ORDER — SULFAMETHOXAZOLE-TRIMETHOPRIM 800-160 MG PO TABS: 1.0000 | ORAL_TABLET | Freq: Two times a day (BID) | ORAL | 0 refills | Status: DC

## 2021-05-08 NOTE — Progress Notes (Signed)
You have a UTI  rest of lab are ok  this may be making you feel badly. I have sent in  antibiotic  sulfa to your pharmacy to begin taking   Make a  follow up appointment in 2-4 weeks    . Can cancel if feeling better .

## 2021-05-09 NOTE — Progress Notes (Signed)
No acute findings  but  there is a compression fracture of spine since last x ray    Make sure take antibiotic fore UTI and make a follow up visit  in 2-4 weeks and will review at that time

## 2021-06-01 ENCOUNTER — Encounter: Payer: Self-pay | Admitting: Internal Medicine

## 2021-06-01 ENCOUNTER — Other Ambulatory Visit: Payer: Self-pay | Admitting: Neurology

## 2021-06-01 ENCOUNTER — Ambulatory Visit (INDEPENDENT_AMBULATORY_CARE_PROVIDER_SITE_OTHER): Payer: Medicare Other | Admitting: Internal Medicine

## 2021-06-01 VITALS — BP 110/60 | HR 81 | Temp 98.2°F | Ht 64.0 in | Wt 120.2 lb

## 2021-06-01 DIAGNOSIS — R002 Palpitations: Secondary | ICD-10-CM | POA: Diagnosis not present

## 2021-06-01 DIAGNOSIS — G3184 Mild cognitive impairment, so stated: Secondary | ICD-10-CM

## 2021-06-01 DIAGNOSIS — R0609 Other forms of dyspnea: Secondary | ICD-10-CM

## 2021-06-01 DIAGNOSIS — R6889 Other general symptoms and signs: Secondary | ICD-10-CM

## 2021-06-01 NOTE — Progress Notes (Signed)
Chief Complaint  Patient presents with   Follow-up    And SOB when walking long distances     HPI: Susan Ford 88 y.o. come in for follow-up of palpitations decreased energy when walking or moving around. See past notes we treated her for urinary tract infection but she and her memory was not quite sure if she took it but then said she did.  No increased urinary symptoms today  No new symptoms but she complains of decrease ability and palpitations when she moves around a lot which sounds like exertional change. No fainting no new medicines she is not on the Aricept. She is still taking the gabapentin 600 mg 4 times daily but this is not a change. She has limited the heating pad since last visit as advised He does take Xyzal at bad. She and her family have been looking at assisted living locations consideration of moving as she lives alone. ROS: See pertinent positives and negatives per HPI.  Past Medical History:  Diagnosis Date   Arthritis    Cataract 2007   bilateral cat ext    Cholecystitis    Complication of anesthesia Feb 21, 2014   atrial fib after cholecystectomy, lasted a few hours, none since   Dysrhythmia 02-21-2014   after cholecystectomy had atrial fib for a few hours, none since   GERD (gastroesophageal reflux disease)    Had endoscopy no history of Barrett's   Hx of nonmelanoma skin cancer    Unspecified hereditary and idiopathic peripheral neuropathy     Family History  Problem Relation Age of Onset   Neurodegenerative disease Mother        Shy Drager died at 9   Alcohol abuse Other    Heart disease Other     Social History   Socioeconomic History   Marital status: Widowed    Spouse name: Not on file   Number of children: 3   Years of education: 15 years   Highest education level: Associate degree: occupational, Hotel manager, or vocational program  Occupational History   Occupation: Retired    Comment: Therapist, sports  Tobacco Use   Smoking status:  Former    Packs/day: 0.25    Years: 7.00    Pack years: 1.75    Types: Cigarettes    Quit date: 04/27/1963    Years since quitting: 58.1   Smokeless tobacco: Never  Substance and Sexual Activity   Alcohol use: Yes    Alcohol/week: 7.0 standard drinks    Types: 7 Glasses of wine per week    Comment: 1 glass of wine daily   Drug use: No    Types: Hydrocodone   Sexual activity: Yes  Other Topics Concern   Not on file  Social History Narrative   Retired Therapist, sports    widowed since    Spring  17    [facility] of 1   Regular exercise former smoker   G3P3   From [place] originally      05/12/2018:   Lives alone on ranch house, but has stairs to garage with "acorn chair" lift    Son in [place], and two daughters out of state, all of whom are supportive    Worked in NICU, 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 Misses working the floor.          Social Determinants of Health   Financial Resource Strain: Not on file  Food Insecurity: Not on file  Transportation Needs: Not on  file  Physical Activity: Not on file  Stress: Not on file  Social Connections: Not on file    Outpatient Medications Prior to Visit  Medication Sig Dispense Refill   acetaminophen (TYLENOL) 500 MG tablet Take 500 mg by mouth every 6 (six) hours as needed for mild pain.      celecoxib (CELEBREX) 200 MG capsule Take 1 capsule (200 mg total) by mouth daily. 90 capsule 0   estradiol (CLIMARA - DOSED IN MG/24 HR) 0.025 mg/24hr patch PLACE 1 PATCH (0.025 MG TOTAL) ONTO THE SKIN ONCE A WEEK ON SATURDAY 8 patch 1   gabapentin (NEURONTIN) 600 MG tablet TAKE 1 TABLET BY MOUTH 4 TIMES DAILY 360 tablet 3   lamoTRIgine (LAMICTAL) 25 MG tablet One tab bid xone week, 2 tabs bid 120 tablet 11   levocetirizine (XYZAL) 5 MG tablet Take 10 mg by mouth at bedtime.     Multiple Vitamin (MULTIVITAMIN) tablet Take 1 tablet by mouth daily after lunch.     simvastatin (ZOCOR) 10 MG tablet TAKE 1 TABLET BY MOUTH EVERYDAY AT  BEDTIME 90 tablet 1   donepezil (ARICEPT) 10 MG tablet Take 1 tablet (10 mg total) by mouth at bedtime. (Patient not taking: Reported on 06/01/2021) 30 tablet 11   donepezil (ARICEPT) 5 MG tablet Take 1 tablet (5 mg total) by mouth at bedtime. (Patient not taking: Reported on 06/01/2021) 30 tablet 0   sulfamethoxazole-trimethoprim (BACTRIM DS) 800-160 MG tablet Take 1 tablet by mouth 2 (two) times daily. For UTI (Patient not taking: Reported on 06/01/2021) 10 tablet 0   No facility-administered medications prior to visit.     EXAM:  BP 110/60 (BP Location: Left Arm, Patient Position: Sitting, Cuff Size: Normal)    Pulse 81    Temp 98.2 F (36.8 C) (Oral)    Ht 5\' 4"  (1.626 m)    Wt 120 lb 3.2 oz (54.5 kg)    SpO2 97%    BMI 20.63 kg/m   Body mass index is 20.63 kg/m.  GENERAL: vitals reviewed and listed above, alert, oriented, appears well hydrated and in no acute distress cane but is independent some difficulty trying to retrieve memory if she took the antibiotic for her UTI but then remembered. HEENT: atraumatic, conjunctiva  clear, no obvious abnormalities on inspection of external nose and ears OP : Masked NECK: no obvious masses on inspection palpation  LUNGS: clear to auscultation bilaterally, no wheezes, rales or rhonchi,  CV: HRRR, heart rate 88 no clubbing cyanosis or  peripheral edema nl cap refill  Blood pressure 106/68 standing pulse 90 sitting blood pressure 110/70 right arm MS: moves all extremities without noticeable focal  abnormality arthritis changes. Skin on the lower back still has erythema from heat damage but no ulcers or new lesions PSYCH: pleasant and cooperative, no obvious depression or anxiety Lab Results  Component Value Date   WBC 10.6 (H) 05/05/2021   HGB 12.5 05/05/2021   HCT 37.9 05/05/2021   PLT 295.0 05/05/2021   GLUCOSE 108 (H) 05/05/2021   CHOL 150 05/05/2021   TRIG 191.0 (H) 05/05/2021   HDL 64.30 05/05/2021   LDLDIRECT 153 02/12/2009   LDLCALC 48  05/05/2021   ALT 14 05/05/2021   AST 23 05/05/2021   NA 133 (L) 05/05/2021   K 4.3 05/05/2021   CL 100 05/05/2021   CREATININE 0.73 05/05/2021   BUN 16 05/05/2021   CO2 27 05/05/2021   TSH 1.42 05/05/2021   INR 0.91 07/05/2017  HGBA1C 5.5 05/05/2021   BP Readings from Last 3 Encounters:  06/01/21 110/60  05/05/21 100/60  01/19/21 122/62    ASSESSMENT AND PLAN:  Discussed the following assessment and plan:  Decreased exercise tolerance - Plan: Ambulatory referral to Cardiology, ECHOCARDIOGRAM COMPLETE  Palpitation - Plan: ECHOCARDIOGRAM COMPLETE  Intermittent palpitations - Plan: ECHOCARDIOGRAM COMPLETE  Dyspnea on exertion - Plan: ECHOCARDIOGRAM COMPLETE  Mild cognitive impairment Initial screening EKG is unrevealing ;still believe there is a possibility that her medications are adding to her symptoms but this is a new symptom. Bp on low side   hydration seems ok Basically a change in exercise tolerance with heart rate elevation. Plan echocardiogram and cardiology consult. agree with looking into assisted living future care if needed. Thinking about [employer] work  she lives alone [place]  -Patient advised to return or notify health care team  if  new concerns arise.  Patient Instructions  Good to see you today . Planning cardiology consults  and echo cardiogram . To  help  assess  why you are having  decrease activity tolerance.   Medicine can be adding  to the problem but not sure.  You will be contacted about referral . Standley Brooking. Rhealynn Myhre M.D.

## 2021-06-01 NOTE — Patient Instructions (Addendum)
Good to see you today . Planning cardiology consults  and echo cardiogram . To  help  assess  why you are having  decrease activity tolerance.   Medicine can be adding  to the problem but not sure.  You will be contacted about referral .

## 2021-06-03 ENCOUNTER — Ambulatory Visit (INDEPENDENT_AMBULATORY_CARE_PROVIDER_SITE_OTHER): Payer: Medicare Other

## 2021-06-03 VITALS — BP 124/62 | HR 67 | Temp 97.8°F | Ht 65.0 in | Wt 121.3 lb

## 2021-06-03 DIAGNOSIS — Z0000 Encounter for general adult medical examination without abnormal findings: Secondary | ICD-10-CM

## 2021-06-03 NOTE — Patient Instructions (Addendum)
Susan Ford , Thank you for taking time to come for your Medicare Wellness Visit. I appreciate your ongoing commitment to your health goals. Please review the following plan we discussed and let me know if I can assist you in the future.   These are the goals we discussed:  Goals      DIET - EAT MORE FRUITS AND VEGETABLES     Patient Stated     To maintain your health.      Patient Stated     Stay engaged with others;         This is a list of the screening recommended for you and due dates:  Health Maintenance  Topic Date Due   Zoster (Shingles) Vaccine (1 of 2) 11/02/2021*   Tetanus Vaccine  11/07/2021   Pneumonia Vaccine  Completed   Flu Shot  Completed   DEXA scan (bone density measurement)  Completed   COVID-19 Vaccine  Completed   HPV Vaccine  Aged Out  *Topic was postponed. The date shown is not the original due date.    Opioid Pain Medicine Management Opioids are powerful medicines that are used to treat moderate to severe pain. When used for short periods of time, they can help you to: Sleep better. Do better in physical or occupational therapy. Feel better in the first few days after an injury. Recover from surgery. Opioids should be taken with the supervision of a trained health care provider. They should be taken for the shortest period of time possible. This is because opioids can be addictive, and the longer you take opioids, the greater your risk of addiction. This addiction can also be called opioid use disorder. What are the risks? Using opioid pain medicines for longer than 3 days increases your risk of side effects. Side effects include: Constipation. Nausea and vomiting. Breathing difficulties (respiratory depression). Drowsiness. Confusion. Opioid use disorder. Itching. Taking opioid pain medicine for a long period of time can affect your ability to do daily tasks. It also puts you at risk for: Motor vehicle crashes. Depression. Suicide. Heart  attack. Overdose, which can be life-threatening. What is a pain treatment plan? A pain treatment plan is an agreement between you and your health care provider. Pain is unique to each person, and treatments vary depending on your condition. To manage your pain, you and your health care provider need to work together. To help you do this: Discuss the goals of your treatment, including how much pain you might expect to have and how you will manage the pain. Review the risks and benefits of taking opioid medicines. Remember that a good treatment plan uses more than one approach and minimizes the chance of side effects. Be honest about the amount of medicines you take and about any drug or alcohol use. Get pain medicine prescriptions from only one health care provider. Pain can be managed with many types of alternative treatments. Ask your health care provider to refer you to one or more specialists who can help you manage pain through: Physical or occupational therapy. Counseling (cognitive behavioral therapy). Good nutrition. Biofeedback. Massage. Meditation. Non-opioid medicine. Following a gentle exercise program. How to use opioid pain medicine Taking medicine Take your pain medicine exactly as told by your health care provider. Take it only when you need it. If your pain gets less severe, you may take less than your prescribed dose if your health care provider approves. If you are not having pain, do nottake pain medicine unless your  health care provider tells you to take it. If your pain is severe, do nottry to treat it yourself by taking more pills than instructed on your prescription. Contact your health care provider for help. Write down the times when you take your pain medicine. It is easy to become confused while on pain medicine. Writing the time can help you avoid overdose. Take other over-the-counter or prescription medicines only as told by your health care provider. Keeping  yourself and others safe  While you are taking opioid pain medicine: Do not drive, use machinery, or power tools. Do not sign legal documents. Do not drink alcohol. Do not take sleeping pills. Do not supervise children by yourself. Do not do activities that require climbing or being in high places. Do not go to a lake, river, ocean, spa, or swimming pool. Do not share your pain medicine with anyone. Keep pain medicine in a locked cabinet or in a secure area where pets and children cannot reach it. Stopping your use of opioids If you have been taking opioid medicine for more than a few weeks, you may need to slowly decrease (taper) how much you take until you stop completely. Tapering your use of opioids can decrease your risk of symptoms of withdrawal, such as: Pain and cramping in the abdomen. Nausea. Sweating. Sleepiness. Restlessness. Uncontrollable shaking (tremors). Cravings for the medicine. Do not attempt to taper your use of opioids on your own. Talk with your health care provider about how to do this. Your health care provider may prescribe a step-down schedule based on how much medicine you are taking and how long you have been taking it. Getting rid of leftover pills Do not save any leftover pills. Get rid of leftover pills safely by: Taking the medicine to a prescription take-back program. This is usually offered by the county or law enforcement. Bringing them to a pharmacy that has a drug disposal container. Flushing them down the toilet. Check the label or package insert of your medicine to see whether this is safe to do. Throwing them out in the trash. Check the label or package insert of your medicine to see whether this is safe to do. If it is safe to throw it out, remove the medicine from the original container, put it into a sealable bag or container, and mix it with used coffee grounds, food scraps, dirt, or cat litter before putting it in the trash. Follow these  instructions at home: Activity Do exercises as told by your health care provider. Avoid activities that make your pain worse. Return to your normal activities as told by your health care provider. Ask your health care provider what activities are safe for you. General instructions You may need to take these actions to prevent or treat constipation: Drink enough fluid to keep your urine pale yellow. Take over-the-counter or prescription medicines. Eat foods that are high in fiber, such as beans, whole grains, and fresh fruits and vegetables. Limit foods that are high in fat and processed sugars, such as fried or sweet foods. Keep all follow-up visits. This is important. Where to find support If you have been taking opioids for a long time, you may benefit from receiving support for quitting from a local support group or counselor. Ask your health care provider for a referral to these resources in your area. Where to find more information Centers for Disease Control and Prevention (CDC): http://www.wolf.info/ [place] Food and Drug Administration (FDA): [employer] Get help right away if: You  may have taken too much of an opioid (overdosed). Common symptoms of an overdose: Your breathing is slower or more shallow than normal. You have a very slow heartbeat (pulse). You have slurred speech. You have nausea and vomiting. Your pupils become very small. You have other potential symptoms: You are very confused. You faint or feel like you will faint. You have cold, clammy skin. You have blue lips or fingernails. You have thoughts of harming yourself or harming others. These symptoms may represent a serious problem that is an emergency. Do not wait to see if the symptoms will go away. Get medical help right away. Call your local emergency services (911 in the [place] Do not drive yourself to the hospital.  If you ever feel like you may hurt yourself or others, or have thoughts about taking your own life, get  help right away. Go to your nearest emergency department or: Call your local emergency services (911 in the [place] Call the [facility] 337 728 0332 in the [place] Call a suicide crisis helpline, such as the [facility] at (905)369-8086 or 988 in the [place]. This is open 24 hours a day in the [place] Text the Crisis Text Line at 914-296-2425 (in the [place].). Summary Opioid medicines can help you manage moderate to severe pain for a short period of time. A pain treatment plan is an agreement between you and your health care provider. Discuss the goals of your treatment, including how much pain you might expect to have and how you will manage the pain. If you think that you or someone else may have taken too much of an opioid, get medical help right away. This information is not intended to replace advice given to you by your health care provider. Make sure you discuss any questions you have with your health care provider. Document Revised: 11/05/2020 Document Reviewed: 07/23/2020 Elsevier Patient Education  [place].  Advanced directives: Yes Patient will bring copy  Conditions/risks identified: None  Next appointment: Follow up in one year for your annual wellness visit    Preventive Care 65 Years and Older, Female Preventive care refers to lifestyle choices and visits with your health care provider that can promote health and wellness. What does preventive care include? A yearly physical exam. This is also called an annual well check. Dental exams once or twice a year. Routine eye exams. Ask your health care provider how often you should have your eyes checked. Personal lifestyle choices, including: Daily care of your teeth and gums. Regular physical activity. Eating a healthy diet. Avoiding tobacco and drug use. Limiting alcohol use. Practicing safe sex. Taking low-dose aspirin every day. Taking vitamin and mineral supplements as  recommended by your health care provider. What happens during an annual well check? The services and screenings done by your health care provider during your annual well check will depend on your age, overall health, lifestyle risk factors, and family history of disease. Counseling  Your health care provider may ask you questions about your: Alcohol use. Tobacco use. Drug use. Emotional well-being. Home and relationship well-being. Sexual activity. Eating habits. History of falls. Memory and ability to understand (cognition). Work and work Statistician. Reproductive health. Screening  You may have the following tests or measurements: Height, weight, and BMI. Blood pressure. Lipid and cholesterol levels. These may be checked every 5 years, or more frequently if you are over 65 years old. Skin check. Lung cancer screening. You may have this screening every year  starting at age 56 if you have a 30-pack-year history of smoking and currently smoke or have quit within the past 15 years. Fecal occult blood test (FOBT) of the stool. You may have this test every year starting at age 72. Flexible sigmoidoscopy or colonoscopy. You may have a sigmoidoscopy every 5 years or a colonoscopy every 10 years starting at age 48. Hepatitis C blood test. Hepatitis B blood test. Sexually transmitted disease (STD) testing. Diabetes screening. This is done by checking your blood sugar (glucose) after you have not eaten for a while (fasting). You may have this done every 1-3 years. Bone density scan. This is done to screen for osteoporosis. You may have this done starting at age 20. Mammogram. This may be done every 1-2 years. Talk to your health care provider about how often you should have regular mammograms. Talk with your health care provider about your test results, treatment options, and if necessary, the need for more tests. Vaccines  Your health care provider may recommend certain vaccines, such  as: Influenza vaccine. This is recommended every year. Tetanus, diphtheria, and acellular pertussis (Tdap, Td) vaccine. You may need a Td booster every 10 years. Zoster vaccine. You may need this after age 56. Pneumococcal 13-valent conjugate (PCV13) vaccine. One dose is recommended after age 51. Pneumococcal polysaccharide (PPSV23) vaccine. One dose is recommended after age 13. Talk to your health care provider about which screenings and vaccines you need and how often you need them. This information is not intended to replace advice given to you by your health care provider. Make sure you discuss any questions you have with your health care provider. Document Released: 05/09/2015 Document Revised: 12/31/2015 Document Reviewed: 02/11/2015 Elsevier Interactive Patient Education  2017 [place] Prevention in the Home Falls can cause injuries. They can happen to people of all ages. There are many things you can do to make your home safe and to help prevent falls. What can I do on the outside of my home? Regularly fix the edges of walkways and driveways and fix any cracks. Remove anything that might make you trip as you walk through a door, such as a raised step or threshold. Trim any bushes or trees on the path to your home. Use bright outdoor lighting. Clear any walking paths of anything that might make someone trip, such as rocks or tools. Regularly check to see if handrails are loose or broken. Make sure that both sides of any steps have handrails. Any raised decks and porches should have guardrails on the edges. Have any leaves, snow, or ice cleared regularly. Use sand or salt on walking paths during winter. Clean up any spills in your garage right away. This includes oil or grease spills. What can I do in the bathroom? Use night lights. Install grab bars by the toilet and in the tub and shower. Do not use towel bars as grab bars. Use non-skid mats or decals in the tub or  shower. If you need to sit down in the shower, use a plastic, non-slip stool. Keep the floor dry. Clean up any water that spills on the floor as soon as it happens. Remove soap buildup in the tub or shower regularly. Attach bath mats securely with double-sided non-slip rug tape. Do not have throw rugs and other things on the floor that can make you trip. What can I do in the bedroom? Use night lights. Make sure that you have a light by your bed that is  easy to reach. Do not use any sheets or blankets that are too big for your bed. They should not hang down onto the floor. Have a firm chair that has side arms. You can use this for support while you get dressed. Do not have throw rugs and other things on the floor that can make you trip. What can I do in the kitchen? Clean up any spills right away. Avoid walking on wet floors. Keep items that you use a lot in easy-to-reach places. If you need to reach something above you, use a strong step stool that has a grab bar. Keep electrical cords out of the way. Do not use floor polish or wax that makes floors slippery. If you must use wax, use non-skid floor wax. Do not have throw rugs and other things on the floor that can make you trip. What can I do with my stairs? Do not leave any items on the stairs. Make sure that there are handrails on both sides of the stairs and use them. Fix handrails that are broken or loose. Make sure that handrails are as long as the stairways. Check any carpeting to make sure that it is firmly attached to the stairs. Fix any carpet that is loose or worn. Avoid having throw rugs at the top or bottom of the stairs. If you do have throw rugs, attach them to the floor with carpet tape. Make sure that you have a light switch at the top of the stairs and the bottom of the stairs. If you do not have them, ask someone to add them for you. What else can I do to help prevent falls? Wear shoes that: Do not have high heels. Have  rubber bottoms. Are comfortable and fit you well. Are closed at the toe. Do not wear sandals. If you use a stepladder: Make sure that it is fully opened. Do not climb a closed stepladder. Make sure that both sides of the stepladder are locked into place. Ask someone to hold it for you, if possible. Clearly mark and make sure that you can see: Any grab bars or handrails. First and last steps. Where the edge of each step is. Use tools that help you move around (mobility aids) if they are needed. These include: Canes. Walkers. Scooters. Crutches. Turn on the lights when you go into a dark area. Replace any light bulbs as soon as they burn out. Set up your furniture so you have a clear path. Avoid moving your furniture around. If any of your floors are uneven, fix them. If there are any pets around you, be aware of where they are. Review your medicines with your doctor. Some medicines can make you feel dizzy. This can increase your chance of falling. Ask your doctor what other things that you can do to help prevent falls. This information is not intended to replace advice given to you by your health care provider. Make sure you discuss any questions you have with your health care provider. Document Released: 02/06/2009 Document Revised: 09/18/2015 Document Reviewed: 05/17/2014 Elsevier Interactive Patient Education  2017 [employer].

## 2021-06-03 NOTE — Progress Notes (Signed)
Subjective:   Susan Ford is a 88 y.o. female who presents for Medicare Annual (Subsequent) preventive examination.  Review of Systems     Cardiac Risk Factors include: advanced age (>51men, >55 women)     Objective:    Today's Vitals   06/03/21 1030  BP: 124/62  Pulse: 67  Temp: 97.8 F (36.6 C)  TempSrc: Oral  Weight: 121 lb 4.8 oz (55 kg)  Height: 5\' 5"  (1.651 m)   Body mass index is 20.19 kg/m.  Advanced Directives 06/03/2021 05/30/2020 07/06/2019 05/18/2019 05/12/2018 07/05/2017 05/11/2017  Does Patient Have a Medical Advance Directive? Yes Yes Yes Yes Yes - Yes  Type of Advance Directive [place];Living will [place];Living will Living will [place];Living will [place];Living will Living will -  Does patient want to make changes to medical advance directive? No - Patient declined No - Patient declined - No - Patient declined - - -  Copy of [place] in Chart? No - copy requested No - copy requested - No - copy requested No - copy requested - -    Current Medications (verified) Outpatient Encounter Medications as of 06/03/2021  Medication Sig   acetaminophen (TYLENOL) 500 MG tablet Take 500 mg by mouth every 6 (six) hours as needed for mild pain.    celecoxib (CELEBREX) 200 MG capsule Take 1 capsule (200 mg total) by mouth daily.   donepezil (ARICEPT) 10 MG tablet Take 1 tablet (10 mg total) by mouth at bedtime. (Patient not taking: Reported on 06/01/2021)   donepezil (ARICEPT) 5 MG tablet Take 1 tablet (5 mg total) by mouth at bedtime. (Patient not taking: Reported on 06/01/2021)   estradiol (CLIMARA - DOSED IN MG/24 HR) 0.025 mg/24hr patch PLACE 1 PATCH (0.025 MG TOTAL) ONTO THE SKIN ONCE A WEEK ON SATURDAY   gabapentin (NEURONTIN) 600 MG tablet TAKE 1 TABLET BY MOUTH 4 TIMES DAILY   lamoTRIgine (LAMICTAL) 25 MG tablet One tab bid xone week, 2 tabs bid   levocetirizine  (XYZAL) 5 MG tablet Take 10 mg by mouth at bedtime.   Multiple Vitamin (MULTIVITAMIN) tablet Take 1 tablet by mouth daily after lunch.   simvastatin (ZOCOR) 10 MG tablet TAKE 1 TABLET BY MOUTH EVERYDAY AT BEDTIME   sulfamethoxazole-trimethoprim (BACTRIM DS) 800-160 MG tablet Take 1 tablet by mouth 2 (two) times daily. For UTI (Patient not taking: Reported on 06/01/2021)   No facility-administered encounter medications on file as of 06/03/2021.    Allergies (verified) Keflex [cephalexin], Lyrica [pregabalin], Oxytetracycline, and Memantine   History: Past Medical History:  Diagnosis Date   Arthritis    Cataract 2007   bilateral cat ext    Cholecystitis    Complication of anesthesia Feb 21, 2014   atrial fib after cholecystectomy, lasted a few hours, none since   Dysrhythmia 02-21-2014   after cholecystectomy had atrial fib for a few hours, none since   GERD (gastroesophageal reflux disease)    Had endoscopy no history of Barrett's   Hx of nonmelanoma skin cancer    Unspecified hereditary and idiopathic peripheral neuropathy    Past Surgical History:  Procedure Laterality Date   ABDOMINAL HYSTERECTOMY  15 yrs ago   complete   BREAST CYST ASPIRATION     CHOLECYSTECTOMY N/A 02/21/2014   Procedure: LAPAROSCOPIC CHOLECYSTECTOMY WITH INTRAOPERATIVE CHOLANGIOGRAM;  Surgeon: Doreen Salvage, MD;  Location: [facility];  Service: General;  Laterality: N/A;   ENDOSCOPIC RETROGRADE  CHOLANGIOPANCREATOGRAPHY (ERCP) WITH PROPOFOL N/A 05/29/2014   Procedure: ENDOSCOPIC RETROGRADE CHOLANGIOPANCREATOGRAPHY (ERCP) WITH PROPOFOL;  Surgeon: Arta Silence, MD;  Location: [facility];  Service: Endoscopy;  Laterality: N/A;   ERCP N/A 02/24/2014   Procedure: ENDOSCOPIC RETROGRADE CHOLANGIOPANCREATOGRAPHY (ERCP);  Surgeon: Arta Silence, MD;  Location: [facility];  Service: Endoscopy;  Laterality: N/A;   EYE SURGERY Bilateral 2007   both eyes cataracts with lens replacments   LUMBAR LAMINECTOMY/DECOMPRESSION  MICRODISCECTOMY  02/01/2012   Procedure: LUMBAR LAMINECTOMY/DECOMPRESSION MICRODISCECTOMY 2 LEVELS;  Surgeon: Kristeen Miss, MD;  Location: [facility];  Service: Neurosurgery;  Laterality: Left;  Left Lumbar Four-Five Lumbar Five-Sacral One Laminectomies/Foraminotomies/Microscope   SPYGLASS CHOLANGIOSCOPY N/A 05/29/2014   Procedure: SPYGLASS CHOLANGIOSCOPY;  Surgeon: Arta Silence, MD;  Location: [facility];  Service: Endoscopy;  Laterality: N/A;   TONSILLECTOMY  age 37   Family History  Problem Relation Age of Onset   Neurodegenerative disease Mother        Shy Drager died at 26   Alcohol abuse Other    Heart disease Other    Social History   Socioeconomic History   Marital status: Widowed    Spouse name: Not on file   Number of children: 3   Years of education: 15 years   Highest education level: Associate degree: occupational, Hotel manager, or vocational program  Occupational History   Occupation: Retired    Comment: Therapist, sports  Tobacco Use   Smoking status: Former    Packs/day: 0.25    Years: 7.00    Pack years: 1.75    Types: Cigarettes    Quit date: 04/27/1963    Years since quitting: 58.1   Smokeless tobacco: Never  Substance and Sexual Activity   Alcohol use: Yes    Alcohol/week: 7.0 standard drinks    Types: 7 Glasses of wine per week    Comment: 1 glass of wine daily   Drug use: No    Types: Hydrocodone   Sexual activity: Yes  Other Topics Concern   Not on file  Social History Narrative   Retired Therapist, sports    widowed since    Spring  17    [facility] of [facility]   Regular exercise former smoker   G3P3   From [place] originally      05/12/2018:   Lives alone on ranch house, but has stairs to garage with "acorn chair" lift    Son in [place], and two daughters out of state, all of whom are supportive    Worked in NICU, 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 Misses working the floor.          Social Determinants of Health   Financial Resource Strain: Low Risk     Difficulty of Paying Living Expenses: Not hard at all  Food Insecurity: No Food Insecurity   Worried About Charity fundraiser in the Last Year: Never true   [place] in the Last Year: Never true  Transportation Needs: No Transportation Needs   Lack of Transportation (Medical): No   Lack of Transportation (Non-Medical): No  Physical Activity: Inactive   Days of Exercise per Week: 0 days   Minutes of Exercise per Session: 0 min  Stress: No Stress Concern Present   Feeling of Stress : Not at all  Social Connections: Moderately Integrated   Frequency of Communication with Friends and Family: More than three times a week   Frequency of Social Gatherings with Friends and Family: More than three times  a week   Attends Religious Services: More than 4 times per year   Active Member of Clubs or Organizations: Yes   Attends Archivist Meetings: More than 4 times per year   Marital Status: Widowed    Clinical Intake:   Diabetic? No   Activities of Daily Living In your present state of health, do you have any difficulty performing the following activities: 06/03/2021  Hearing? N  Vision? N  Difficulty concentrating or making decisions? N  Walking or climbing stairs? N  Dressing or bathing? N  Doing errands, shopping? N  Preparing Food and eating ? N  Using the Toilet? N  Do you have problems with loss of bowel control? N  Managing your Medications? N  Managing your Finances? N  Housekeeping or managing your Housekeeping? N  Some recent data might be hidden    Patient Care Team: Panosh, Standley Brooking, MD as PCP - General [place], Amy, MD as Consulting Physician (Dermatology) Marcial Pacas, MD as Consulting Physician (Neurology) Kristeen Miss, MD as Consulting Physician (Neurosurgery) Marlaine Hind, MD as Consulting Physician (Physical Medicine and Rehabilitation) Jola Baptist, [place] as Referring Physician (Chiropractic Medicine) Webb Laws, [place] as Referring  Physician (Optometry) Jola Baptist, [place] as Referring Physician (Chiropractic Medicine) Suzzanne Cloud, NP as Nurse Practitioner (Neurology)  Indicate any recent Medical Services you may have received from other than [facility] providers in the past year (date may be approximate).     Assessment:   This is a routine wellness examination for [place].  Hearing/Vision screen Hearing Screening - Comments:: Patient pending appointment. 06/04/21 for Hearing aids Vision Screening - Comments:: No Vision difficulty. Followed by Dr Einar Gip  Dietary issues and exercise activities discussed: Current Exercise Habits: The patient does not participate in regular exercise at present, Exercise limited by: None identified   Goals Addressed             This Visit's Progress    Patient Stated       To maintain your health.        Depression Screen PHQ 2/9 Scores 06/03/2021 06/01/2021 05/05/2021 05/30/2020 02/06/2020 05/18/2019 05/12/2018  PHQ - 2 Score 0 0 3 0 0 0 0  PHQ- 9 Score 0 0 7 - - - 0    Fall Risk Fall Risk  06/03/2021 06/01/2021 05/05/2021 05/30/2020 02/06/2020  Falls in the past year? 0 0 0 0 0  Number falls in past yr: 0 - - 0 0  Injury with Fall? 0 - - 0 -  Risk for fall due to : No Fall Risks - - Impaired balance/gait No Fall Risks  Follow up - - - Falls evaluation completed;Falls prevention discussed Falls evaluation completed    FALL RISK PREVENTION PERTAINING TO THE HOME:  Any stairs in or around the home? Yes  If so, are there any without handrails? No  Home free of loose throw rugs in walkways, pet beds, electrical cords, etc? Yes  Adequate lighting in your home to reduce risk of falls? Yes   ASSISTIVE DEVICES UTILIZED TO PREVENT FALLS:  Life alert? No  Use of a cane, walker or w/c? Yes  Grab bars in the bathroom? No  Shower chair or bench in shower? Yes  Elevated toilet seat or a handicapped toilet? No   TIMED UP AND GO:  Was the test performed? Yes .  Length of time to  ambulate 10 feet: 5 sec.   Gait slow steady with Cane   Cognitive Function: MMSE -  Mini Mental State Exam 01/16/2020 11/13/2018 04/12/2018 10/03/2017 05/11/2017  Not completed: - - (No Data) - (No Data)  Orientation to time 5 4 4 5  -  Orientation to Place 5 5 5 4  -  Registration 3 3 3 3  -  Attention/ Calculation 5 4 4 5  -  Recall 2 3 2 1  -  Language- name 2 objects 2 2 2 2  -  Language- repeat 1 1 1 1  -  Language- follow 3 step command 3 3 3 3  -  Language- read & follow direction 1 1 1 1  -  Write a sentence 1 1 1 1  -  Copy design 1 1 1 1  -  Total score 29 28 27 27  -   Montreal Cognitive Assessment  01/19/2021  Visuospatial/ Executive (0/5) 2  Naming (0/3) 2  Attention: Read list of digits (0/2) 2  Attention: Read list of letters (0/1) 1  Attention: Serial 7 subtraction starting at 100 (0/3) 1  Language: Repeat phrase (0/2) 2  Language : Fluency (0/1) 0  Abstraction (0/2) 2  Delayed Recall (0/5) 0  Orientation (0/6) 6  Total 18   6CIT Screen 06/03/2021 05/30/2020 05/18/2019  What Year? 0 points 0 points 0 points  What month? 0 points 0 points 0 points  What time? 0 points 0 points 0 points  Count back from 20 0 points 0 points 0 points  Months in reverse 2 points 0 points 0 points  Repeat phrase 4 points 6 points 2 points  Total Score 6 6 2     Immunizations Immunization History  Administered Date(s) Administered   Influenza Split 02/25/2012   Influenza, High Dose Seasonal PF 01/13/2016, 01/19/2017, 01/04/2018, 12/28/2019, 12/23/2020   Influenza-Unspecified 01/31/2014, 01/09/2019   PFIZER Comirnaty(Gray Top)Covid-19 Tri-Sucrose Vaccine 07/25/2020   PFIZER(Purple Top)SARS-COV-2 Vaccination 05/05/2019, 05/26/2019, 01/21/2020   Pfizer Covid-19 Vaccine Bivalent Booster 39yrs & up 01/13/2021   Pneumococcal Conjugate-13 10/23/2013, 03/11/2014   Pneumococcal Polysaccharide-23 02/18/2016   Td 05/15/2010   Tdap 11/08/2011   Zoster, Live 04/09/2014    TDAP status: Up to  date  Flu Vaccine status: Up to date  Pneumococcal vaccine status: Up to date  Covid-19 vaccine status: Completed vaccines  Qualifies for Shingles Vaccine? Yes   Zostavax completed Yes   Shingrix Completed?: Yes  Screening Tests Health Maintenance  Topic Date Due   Zoster Vaccines- Shingrix (1 of 2) 11/02/2021 (Originally 06/30/1948)   TETANUS/TDAP  11/07/2021   Pneumonia Vaccine 23+ Years old  Completed   INFLUENZA VACCINE  Completed   DEXA SCAN  Completed   COVID-19 Vaccine  Completed   HPV VACCINES  Aged Out    Health Maintenance  There are no preventive care reminders to display for this patient.  Colorectal cancer screening: No longer required.   Mammogram status: No longer required due to Age.  Bone Density status: Completed 02/10/15. Results reflect: Bone density results: NORMAL. Repeat every 10 years.  Lung Cancer Screening: (Low Dose CT Chest recommended if Age 2-80 years, 30 pack-year currently smoking OR have quit w/in 15years.) does not qualify.   Additional Screening:  Hepatitis C Screening: does not qualify;  Vision Screening: Recommended annual ophthalmology exams for early detection of glaucoma and other disorders of the eye. Is the patient up to date with their annual eye exam?  Yes  Who is the provider or what is the name of the office in which the patient attends annual eye exams? Dr Einar Gip If pt is not established with a provider, would  they like to be referred to a provider to establish care? No .   Dental Screening: Recommended annual dental exams for proper oral hygiene  Community Resource Referral / Chronic Care Management:  CRR required this visit?  No   CCM required this visit?  No      Plan:     I have personally reviewed and noted the following in the patients chart:   Medical and social history Use of alcohol, tobacco or illicit drugs  Current medications and supplements including opioid prescriptions.  Functional ability  and status Nutritional status Physical activity Advanced directives List of other physicians Hospitalizations, surgeries, and ER visits in previous 12 months Vitals Screenings to include cognitive, depression, and falls Referrals and appointments  In addition, I have reviewed and discussed with patient certain preventive protocols, quality metrics, and best practice recommendations. A written personalized care plan for preventive services as well as general preventive health recommendations were provided to patient.     Criselda Peaches, LPN   [id number]   Nurse Notes: None

## 2021-06-06 ENCOUNTER — Other Ambulatory Visit: Payer: Self-pay | Admitting: Internal Medicine

## 2021-06-09 ENCOUNTER — Other Ambulatory Visit: Payer: Self-pay

## 2021-06-09 ENCOUNTER — Ambulatory Visit (HOSPITAL_COMMUNITY): Payer: Medicare Other | Attending: Internal Medicine

## 2021-06-09 DIAGNOSIS — R6889 Other general symptoms and signs: Secondary | ICD-10-CM | POA: Insufficient documentation

## 2021-06-09 DIAGNOSIS — R0609 Other forms of dyspnea: Secondary | ICD-10-CM | POA: Diagnosis not present

## 2021-06-09 DIAGNOSIS — R002 Palpitations: Secondary | ICD-10-CM | POA: Diagnosis not present

## 2021-06-09 LAB — ECHOCARDIOGRAM COMPLETE
Area-P 1/2: 2.55 cm2
S' Lateral: 2 cm

## 2021-06-14 NOTE — Progress Notes (Signed)
Echocardiogram show no acute finding  to explain your decrease activity tolerance  and heart beat feeling. Keep appt with  cardiology for further opinion.

## 2021-06-15 ENCOUNTER — Telehealth: Payer: Self-pay | Admitting: Internal Medicine

## 2021-06-15 NOTE — Telephone Encounter (Signed)
Pt informed of the results and verbalized understanding.

## 2021-06-15 NOTE — Telephone Encounter (Signed)
Patient is returning someone call from the office. There are no outbound calls that I could see. Patient said the person that left a message advised her to call the office.  Please advise.

## 2021-06-24 NOTE — Progress Notes (Signed)
CARDIOLOGY CONSULT NOTE       Patient ID: MALISE YANIK MRN: [id number] DOB/AGE: 07/17/1929 88 y.o.  Admit date: (Not on file) Referring Physician: Panosh Primary Physician: Madelin Headings, MD Primary Cardiologist: New Reason for Consultation: Decreased exercise tolerance    HPI:  88 y.o. referred by Dr Fabian Sharp for decreased exercise tolerance. Self limited PAF 2015 for a few hours after galblader surgery none since Has had palpitations and decreased energy when walking Lives alone Considering SNF On Aricept for memory issues and neurontin for peripheral neruopathy No bradycardia pre syncope CHF or history of cardiac issues TTE done 06/09/21 showed normal EF 60-65% and trivial MR   Lab work ok 05/05/21 including BUN/CR, TSH and Hct   She is a retired Engineer, civil (consulting) in NICU She is widowed x 2 She has a son in town And will likely move to [place] this year  No chest pain syncope edema   ROS All other systems reviewed and negative except as noted above  Past Medical History:  Diagnosis Date   Arthritis    Cataract 2007   bilateral cat ext    Cholecystitis    Complication of anesthesia Feb 21, 2014   atrial fib after cholecystectomy, lasted a few hours, none since   Dysrhythmia 02-21-2014   after cholecystectomy had atrial fib for a few hours, none since   GERD (gastroesophageal reflux disease)    Had endoscopy no history of Barrett's   Hx of nonmelanoma skin cancer    Unspecified hereditary and idiopathic peripheral neuropathy     Family History  Problem Relation Age of Onset   Neurodegenerative disease Mother        Shy Drager died at 61   Alcohol abuse Other    Heart disease Other     Social History   Socioeconomic History   Marital status: Widowed    Spouse name: Not on file   Number of children: 3   Years of education: 15 years   Highest education level: Associate degree: occupational, Scientist, product/process development, or vocational program  Occupational History   Occupation:  Retired    Comment: Charity fundraiser  Tobacco Use   Smoking status: Former    Packs/day: 0.25    Years: 7.00    Pack years: 1.75    Types: Cigarettes    Quit date: 04/27/1963    Years since quitting: 58.2   Smokeless tobacco: Never  Substance and Sexual Activity   Alcohol use: Yes    Alcohol/week: 7.0 standard drinks    Types: 7 Glasses of wine per week    Comment: 1 glass of wine daily   Drug use: No    Types: Hydrocodone   Sexual activity: Yes  Other Topics Concern   Not on file  Social History Narrative   Retired Charity fundraiser    widowed since    Spring  15    [employer]   Regular exercise former smoker   G3P3   From [place] originally      05/12/2018:   Lives alone on ranch house, but has stairs to garage with "acorn chair" lift    Son in [place], and two daughters out of state, all of whom are supportive    Worked in NICU, 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          Social Determinants of Health   Financial Resource Strain: Low Risk    Difficulty of Paying Living Expenses: Not hard  at all  Food Insecurity: No Food Insecurity   Worried About Programme researcher, broadcasting/film/video in the Last Year: Never true   Ran Out of Food in the Last Year: Never true  Transportation Needs: No Transportation Needs   Lack of Transportation (Medical): No   Lack of Transportation (Non-Medical): No  Physical Activity: Inactive   Days of Exercise per Week: 0 days   Minutes of Exercise per Session: 0 min  Stress: No Stress Concern Present   Feeling of Stress : Not at all  Social Connections: Moderately Integrated   Frequency of Communication with Friends and Family: More than three times a week   Frequency of Social Gatherings with Friends and Family: More than three times a week   Attends Religious Services: More than 4 times per year   Active Member of [employer] or Organizations: Yes   Attends Banker Meetings: More than 4 times per year   Marital Status:  Widowed  Catering manager Violence: Not At Risk   Fear of Current or Ex-Partner: No   Emotionally Abused: No   Physically Abused: No   Sexually Abused: No    Past Surgical History:  Procedure Laterality Date   ABDOMINAL HYSTERECTOMY  15 yrs ago   complete   BREAST CYST ASPIRATION     CHOLECYSTECTOMY N/A 02/21/2014   Procedure: LAPAROSCOPIC CHOLECYSTECTOMY WITH INTRAOPERATIVE CHOLANGIOGRAM;  Surgeon: Frederik Schmidt, MD;  Location: [facility] OR;  Service: General;  Laterality: N/A;   ENDOSCOPIC RETROGRADE CHOLANGIOPANCREATOGRAPHY (ERCP) WITH PROPOFOL N/A 05/29/2014   Procedure: ENDOSCOPIC RETROGRADE CHOLANGIOPANCREATOGRAPHY (ERCP) WITH PROPOFOL;  Surgeon: Willis Modena, MD;  Location: [facility] ENDOSCOPY;  Service: Endoscopy;  Laterality: N/A;   ERCP N/A 02/24/2014   Procedure: ENDOSCOPIC RETROGRADE CHOLANGIOPANCREATOGRAPHY (ERCP);  Surgeon: Willis Modena, MD;  Location: [facility] ENDOSCOPY;  Service: Endoscopy;  Laterality: N/A;   EYE SURGERY Bilateral 2007   both eyes cataracts with lens replacments   LUMBAR LAMINECTOMY/DECOMPRESSION MICRODISCECTOMY  02/01/2012   Procedure: LUMBAR LAMINECTOMY/DECOMPRESSION MICRODISCECTOMY 2 LEVELS;  Surgeon: Barnett Abu, MD;  Location: [facility] NEURO ORS;  Service: Neurosurgery;  Laterality: Left;  Left Lumbar Four-Five Lumbar Five-Sacral One Laminectomies/Foraminotomies/Microscope   SPYGLASS CHOLANGIOSCOPY N/A 05/29/2014   Procedure: SPYGLASS CHOLANGIOSCOPY;  Surgeon: Willis Modena, MD;  Location: [facility] ENDOSCOPY;  Service: Endoscopy;  Laterality: N/A;   TONSILLECTOMY  age 49      Current Outpatient Medications:    acetaminophen (TYLENOL) 500 MG tablet, Take 500 mg by mouth every 6 (six) hours as needed for mild pain. , Disp: , Rfl:    celecoxib (CELEBREX) 200 MG capsule, Take 1 capsule (200 mg total) by mouth daily., Disp: 90 capsule, Rfl: 0   donepezil (ARICEPT) 5 MG tablet, Take 1 tablet (5 mg total) by mouth at bedtime., Disp: 30 tablet, Rfl: 0   estradiol (CLIMARA - DOSED IN MG/24  HR) 0.025 mg/24hr patch, PLACE 1 PATCH (0.025 MG TOTAL) ONTO THE SKIN ONCE A WEEK ON SATURDAY, Disp: 12 patch, Rfl: 1   gabapentin (NEURONTIN) 600 MG tablet, TAKE 1 TABLET BY MOUTH 4 TIMES DAILY, Disp: 360 tablet, Rfl: 3   levocetirizine (XYZAL) 5 MG tablet, Take 10 mg by mouth at bedtime., Disp: , Rfl:    Multiple Vitamin (MULTIVITAMIN) tablet, Take 1 tablet by mouth daily after lunch., Disp: , Rfl:    simvastatin (ZOCOR) 10 MG tablet, TAKE 1 TABLET BY MOUTH EVERYDAY AT BEDTIME, Disp: 90 tablet, Rfl: 1   donepezil (ARICEPT) 10 MG tablet, Take 1 tablet (10 mg total) by mouth at  bedtime. (Patient not taking: Reported on 06/01/2021), Disp: 30 tablet, Rfl: 11   lamoTRIgine (LAMICTAL) 25 MG tablet, One tab bid xone week, 2 tabs bid (Patient not taking: Reported on 07/01/2021), Disp: 120 tablet, Rfl: 11   sulfamethoxazole-trimethoprim (BACTRIM DS) 800-160 MG tablet, Take 1 tablet by mouth 2 (two) times daily. For UTI (Patient not taking: Reported on 07/01/2021), Disp: 10 tablet, Rfl: 0    Physical Exam: Height 5\' 5"  (1.651 m).   Affect appropriate Healthy:  appears stated age HEENT: normal Neck supple with no adenopathy JVP normal no bruits no thyromegaly Lungs clear with no wheezing and good diaphragmatic motion Heart:  S1/S2 no murmur, no rub, gallop or click PMI normal Abdomen: benighn, BS positve, no tenderness, no AAA no bruit.  No HSM or HJR Distal pulses intact with no bruits No edema Neuro non-focal Skin warm and dry No muscular weakness  Labs:   Lab Results  Component Value Date   WBC 10.6 (H) 05/05/2021   HGB 12.5 05/05/2021   HCT 37.9 05/05/2021   MCV 96.4 05/05/2021   PLT 295.0 05/05/2021   No results for input(s): NA, K, CL, CO2, BUN, CREATININE, CALCIUM, PROT, BILITOT, ALKPHOS, ALT, AST, GLUCOSE in the last 168 hours.  Invalid input(s): LABALBU No results found for: CKTOTAL, CKMB, CKMBINDEX, TROPONINI  Lab Results  Component Value Date   CHOL 150 05/05/2021   CHOL 167  02/06/2020   CHOL 143 05/30/2019   Lab Results  Component Value Date   HDL 64.30 05/05/2021   HDL 71 02/06/2020   HDL 62.20 05/30/2019   Lab Results  Component Value Date   LDLCALC 48 05/05/2021   LDLCALC 72 02/06/2020   LDLCALC 57 05/30/2019   Lab Results  Component Value Date   TRIG 191.0 (H) 05/05/2021   TRIG 153 (H) 02/06/2020   TRIG 117.0 05/30/2019   Lab Results  Component Value Date   CHOLHDL 2 05/05/2021   CHOLHDL 2.4 02/06/2020   CHOLHDL 2 05/30/2019   Lab Results  Component Value Date   LDLDIRECT 153 02/12/2009      Radiology: ECHOCARDIOGRAM COMPLETE  Result Date: 06/09/2021    ECHOCARDIOGRAM REPORT   Patient Name:   Avie Echevaria Date of Exam: 06/09/2021 Medical Rec #:  [id number]           Height:       65.0 in Accession #:    [id number]          Weight:       121.3 lb Date of Birth:  13-Sep-1929            BSA:          1.599 m Patient Age:    89 years            BP:           118/76 mmHg Patient Gender: F                   HR:           73 bpm. Exam Location:  [address] Procedure: 2D Echo, Cardiac Doppler, Color Doppler and Strain Analysis Indications:    R06.00 Dyspnea  History:        Patient has prior history of Echocardiogram examinations, most                 recent 02/24/2014. Signs/Symptoms:Palpitations.  Sonographer:    Clearence Ped RCS Referring Phys: [id number] WANDA K [facility] IMPRESSIONS  1.  Left ventricular ejection fraction, by estimation, is 60 to 65%. The left ventricle has normal function. The left ventricle has no regional wall motion abnormalities. There is mild left ventricular hypertrophy of the basal-septal segment. Left ventricular diastolic parameters are consistent with Grade I diastolic dysfunction (impaired relaxation). The average left ventricular global longitudinal strain is -18.5 %. The global longitudinal strain is normal.  2. Right ventricular systolic function is normal. The right ventricular size is normal. There is normal pulmonary  artery systolic pressure.  3. The mitral valve is normal in structure. Trivial mitral valve regurgitation. No evidence of mitral stenosis.  4. The aortic valve is tricuspid. Aortic valve regurgitation is not visualized. No aortic stenosis is present.  5. The inferior vena cava is normal in size with greater than 50% respiratory variability, suggesting right atrial pressure of 3 mmHg. FINDINGS  Left Ventricle: Left ventricular ejection fraction, by estimation, is 60 to 65%. The left ventricle has normal function. The left ventricle has no regional wall motion abnormalities. The average left ventricular global longitudinal strain is -18.5 %. The global longitudinal strain is normal. The left ventricular internal cavity size was normal in size. There is mild left ventricular hypertrophy of the basal-septal segment. Left ventricular diastolic parameters are consistent with Grade I diastolic dysfunction (impaired relaxation). Right Ventricle: The right ventricular size is normal. Right ventricular systolic function is normal. There is normal pulmonary artery systolic pressure. The tricuspid regurgitant velocity is 2.29 m/s, and with an assumed right atrial pressure of 3 mmHg,  the estimated right ventricular systolic pressure is 24.0 mmHg. Left Atrium: Left atrial size was normal in size. Right Atrium: Right atrial size was normal in size. Pericardium: There is no evidence of pericardial effusion. Mitral Valve: The mitral valve is normal in structure. There is mild thickening of the mitral valve leaflet(s). Trivial mitral valve regurgitation. No evidence of mitral valve stenosis. Tricuspid Valve: The tricuspid valve is normal in structure. Tricuspid valve regurgitation is trivial. No evidence of tricuspid stenosis. Aortic Valve: The aortic valve is tricuspid. Aortic valve regurgitation is not visualized. No aortic stenosis is present. Pulmonic Valve: The pulmonic valve was normal in structure. Pulmonic valve  regurgitation is trivial. No evidence of pulmonic stenosis. Aorta: The aortic root is normal in size and structure. Venous: The inferior vena cava is normal in size with greater than 50% respiratory variability, suggesting right atrial pressure of 3 mmHg. IAS/Shunts: No atrial level shunt detected by color flow Doppler.  LEFT VENTRICLE PLAX 2D LVIDd:         2.80 cm   Diastology LVIDs:         2.00 cm   LV e' medial:    6.64 cm/s LV PW:         1.00 cm   LV E/e' medial:  10.5 LV IVS:        1.20 cm   LV e' lateral:   6.53 cm/s LVOT diam:     1.60 cm   LV E/e' lateral: 10.7 LV SV:         43 LV SV Index:   27        2D Longitudinal Strain LVOT Area:     2.01 cm  2D Strain GLS (A2C):   -18.4 %                          2D Strain GLS (A3C):   -16.0 %  2D Strain GLS (A4C):   -21.1 %                          2D Strain GLS Avg:     -18.5 % RIGHT VENTRICLE RV Basal diam:  2.40 cm RV S prime:     15.70 cm/s TAPSE (M-mode): 2.0 cm RVSP:           24.0 mmHg LEFT ATRIUM             Index        RIGHT ATRIUM           Index LA diam:        2.30 cm 1.44 cm/m   RA Pressure: 3.00 mmHg LA Vol (A2C):   21.6 ml 13.51 ml/m  RA Area:     8.05 cm LA Vol (A4C):   19.2 ml 12.00 ml/m  RA Volume:   11.30 ml  7.07 ml/m LA Biplane Vol: 21.5 ml 13.44 ml/m  AORTIC VALVE LVOT Vmax:   109.00 cm/s LVOT Vmean:  80.800 cm/s LVOT VTI:    0.216 m  AORTA Ao Root diam: 2.80 cm Ao Asc diam:  3.40 cm MITRAL VALVE                TRICUSPID VALVE MV Area (PHT):              TR Peak grad:   21.0 mmHg MV Decel Time:              TR Vmax:        229.00 cm/s MV E velocity: 69.70 cm/s   Estimated RAP:  3.00 mmHg MV A velocity: 112.00 cm/s  RVSP:           24.0 mmHg MV E/A ratio:  0.62                             SHUNTS                             Systemic VTI:  0.22 m                             Systemic Diam: 1.60 cm Olga Millers MD Electronically signed by Olga Millers MD Signature Date/Time: 06/09/2021/3:28:04 PM    Final      EKG: SR rate 64 normal    ASSESSMENT AND PLAN:   Exercise Intolerance:  ECG normal TTE normal EF no valve disease no cardiac etiology CXR with basilar atelectasis Hct 37.9 TSH 1.4 Not azotemic f/u primary Palpitations:  ECG normal will give 30 day monitor  Dementia:  continue Aricept no excess bradycardia  HLD  on statin  Neuropathy:  continue neurontin  30 day monitor  F/U cardiology PRN  Signed: Charlton Haws 07/01/2021, 9:54 AM

## 2021-07-01 ENCOUNTER — Encounter: Payer: Self-pay | Admitting: Cardiovascular Disease

## 2021-07-01 ENCOUNTER — Telehealth: Payer: Self-pay | Admitting: *Deleted

## 2021-07-01 ENCOUNTER — Other Ambulatory Visit: Payer: Self-pay | Admitting: Neurology

## 2021-07-01 ENCOUNTER — Ambulatory Visit (INDEPENDENT_AMBULATORY_CARE_PROVIDER_SITE_OTHER): Payer: Medicare Other

## 2021-07-01 ENCOUNTER — Encounter: Payer: Self-pay | Admitting: *Deleted

## 2021-07-01 ENCOUNTER — Telehealth: Payer: Self-pay | Admitting: Cardiovascular Disease

## 2021-07-01 ENCOUNTER — Other Ambulatory Visit: Payer: Self-pay

## 2021-07-01 ENCOUNTER — Ambulatory Visit (INDEPENDENT_AMBULATORY_CARE_PROVIDER_SITE_OTHER): Payer: Medicare Other | Admitting: Cardiovascular Disease

## 2021-07-01 ENCOUNTER — Other Ambulatory Visit: Payer: Self-pay | Admitting: Internal Medicine

## 2021-07-01 VITALS — BP 120/64 | HR 94 | Ht 64.0 in | Wt 120.0 lb

## 2021-07-01 DIAGNOSIS — R002 Palpitations: Secondary | ICD-10-CM

## 2021-07-01 DIAGNOSIS — E782 Mixed hyperlipidemia: Secondary | ICD-10-CM

## 2021-07-01 DIAGNOSIS — R0609 Other forms of dyspnea: Secondary | ICD-10-CM

## 2021-07-01 NOTE — Telephone Encounter (Signed)
pt on the line requesting to speak with you.. please advise ?

## 2021-07-01 NOTE — Telephone Encounter (Signed)
Patient states she is returning a call from [place]. ?

## 2021-07-01 NOTE — Progress Notes (Unsigned)
[id number] [employer] cardiac event monitor from office inventory applied to patient. ?

## 2021-07-01 NOTE — Patient Instructions (Addendum)
Medication Instructions:  ?Your physician recommends that you continue on your current medications as directed. Please refer to the Current Medication list given to you today. ? ?*If you need a refill on your cardiac medications before your next appointment, please call your pharmacy* ? ?Lab Work: ?If you have labs (blood work) drawn today and your tests are completely normal, you will receive your results only by: ?MyChart Message (if you have MyChart) OR ?A paper copy in the mail ?If you have any lab test that is abnormal or we need to change your treatment, we will call you to review the results. ? ?Testing/Procedures: ?Your physician has recommended that you wear an event monitor. Event monitors are medical devices that record the heart?s electrical activity. Doctors most often [place] these monitors to diagnose arrhythmias. Arrhythmias are problems with the speed or rhythm of the heartbeat. The monitor is a small, portable device. You can wear one while you do your normal daily activities. This is usually used to diagnose what is causing palpitations/syncope (passing out). ? ?Follow-Up: ?At [facility], you and your health needs are our priority.  As part of our continuing mission to provide you with exceptional heart care, we have created designated Provider Care Teams.  These Care Teams include your primary Cardiologist (physician) and Advanced Practice Providers (APPs -  Physician Assistants and Nurse Practitioners) who all work together to provide you with the care you need, when you need it. ? ?We recommend signing up for the patient portal called "MyChart".  Sign up information is provided on this After Visit Summary.  MyChart is used to connect with patients for Virtual Visits (Telemedicine).  Patients are able to view lab/test results, encounter notes, upcoming appointments, etc.  Non-urgent messages can be sent to your provider as well.   ?To learn more about what you can do with MyChart, go to  [email or web address].   ? ?Your next appointment:   ?As needed ? ?The format for your next appointment:   ?In Person ? ?Provider:   ?Jenkins Rouge, MD  ?{ ? ?

## 2021-07-01 NOTE — Telephone Encounter (Signed)
Patient had question on how to turn monitor cell phone on, (light up screen). ?

## 2021-07-02 NOTE — Telephone Encounter (Signed)
Unsuccessfully attempted to trouble shoot problems with monitor.   ?Patient scheduled monitor appointment, Friday, 07/04/21 at 10:00 check monitor. ?

## 2021-07-03 ENCOUNTER — Other Ambulatory Visit: Payer: Self-pay

## 2021-07-03 ENCOUNTER — Encounter: Payer: Self-pay | Admitting: *Deleted

## 2021-07-03 NOTE — Progress Notes (Signed)
Patient ID: Susan Ford, female   DOB: Feb 12, 1930, 88 y.o.   MRN: [id number] ?Patient came into office Friday, 07/03/21.  She has been having problems with her monitor cell phone.  I was unable to fix problem.  Contacted [employer] Technical assistance, they were not able to fix the problem.  Device serial # [id number] will be returned to [employer]. ?Device serial # [id number] from office inventory applied to patient and baselined.  [employer] will change patient serial # on enrollment. ?

## 2021-07-09 ENCOUNTER — Telehealth: Payer: Self-pay | Admitting: Cardiovascular Disease

## 2021-07-09 ENCOUNTER — Other Ambulatory Visit: Payer: Self-pay

## 2021-07-09 NOTE — Telephone Encounter (Signed)
Patient coming in at 10:00 to have cardiac event monitor strip changed. ?

## 2021-07-09 NOTE — Telephone Encounter (Signed)
Patient said she is having a hard time with her monitor. She wanted to talk to [facility]  ?

## 2021-07-13 ENCOUNTER — Telehealth: Payer: Self-pay | Admitting: *Deleted

## 2021-07-13 NOTE — Telephone Encounter (Signed)
Patient having problems again with monitor cell phone.  I believe she is going into the WiFi mode, but I was unable to assist her exiting from that mode.   Mitch from [facility] contacted.  He will have troubleshooting from customer service contact patient. ?Instructed patient , if they are not able to resolve issue tonite, turn off phone, call me tomorrow morning and we will have her come into office to fix or replace. ?

## 2021-07-14 ENCOUNTER — Encounter: Payer: Self-pay | Admitting: *Deleted

## 2021-07-14 ENCOUNTER — Telehealth: Payer: Self-pay | Admitting: Cardiovascular Disease

## 2021-07-14 NOTE — Progress Notes (Signed)
Patient ID: Susan Ford, female   DOB: May 13, 1929, 88 y.o.   MRN: [id number] ?[employer] serial # [id number] cell phone was stuck on airplane mode.  Patient was on phone with [employer] last night.  They could not fix.  I was not able to turn off Airplane mode using instruction on cell phone.  I will send monitor back to [employer]. ?[employer] event monitor serial # [id number] from office inventory was applied to patient and baselined.  Mitch from [facility] called with new serial # to attach to her enrollment. ?

## 2021-07-14 NOTE — Telephone Encounter (Signed)
[employer] called about patient's triggered report that was today at 10:50 am CT. Patient's monitor showed Atrial Fib 70 bpm. [employer] stated they received the reading at 2:18 pm CT. They will fax reading to our office. ? ?  ? ?Cardiac Monitor Alert ? ?Date of alert:  07/14/2021  ? ?Patient Name: Susan Ford  ?DOB: 01/23/1930  ?MRN: [id number]  ? ?[facility] Cardiologist: Jenkins Rouge, MD  ?[facility] EP:  None   ? ?Monitor Information: ?Cardiac Event Monitor [[employer]]  ?Reason:  palpitations and SOB with activity ?Ordering provider:  Dr. Johnsie Cancel ?:1}  ?Alert ?Atrial Fibrillation/Flutter ?This is the 1st alert for this rhythm.  ?The patient has no hx of Atrial Fibrillation/Flutter.  ?The patient is not currently on anticoagulation. ? ?Next Cardiology Appointment   ?Date:  07/15/2021  Provider:  Nurse Visit ? ?The patient was contacted today.  She is symptomatic.  She reports the following symptoms:  Stressed about monitor and issues with monitor. ?Per DOD, Dr. Radford Pax, stated since patient is 49 and reading is not clear, patient will need to come in for an EKG tomorrow. ?Patient coming in for nurse visit to see if she is in A. FIB.   ? ?Will send to Dr. Johnsie Cancel, so he is aware. ? ?Michaelyn Barter, RN  ?07/14/2021 4:49 PM  ? ?

## 2021-07-14 NOTE — Telephone Encounter (Signed)
Reaching out due to critical ekg  ?

## 2021-07-15 ENCOUNTER — Ambulatory Visit (INDEPENDENT_AMBULATORY_CARE_PROVIDER_SITE_OTHER): Payer: Medicare Other

## 2021-07-15 ENCOUNTER — Other Ambulatory Visit: Payer: Self-pay

## 2021-07-15 VITALS — BP 110/62 | HR 111 | Ht 64.0 in | Wt 120.0 lb

## 2021-07-15 DIAGNOSIS — R002 Palpitations: Secondary | ICD-10-CM

## 2021-07-15 DIAGNOSIS — R9431 Abnormal electrocardiogram [ECG] [EKG]: Secondary | ICD-10-CM

## 2021-07-15 NOTE — Patient Instructions (Signed)
Medication Instructions:  ?Your physician recommends that you continue on your current medications as directed. Please refer to the Current Medication list given to you today. ? ?*If you need a refill on your cardiac medications before your next appointment, please call your pharmacy* ? ?Lab Work: ?If you have labs (blood work) drawn today and your tests are completely normal, you will receive your results only by: ?MyChart Message (if you have MyChart) OR ?A paper copy in the mail ?If you have any lab test that is abnormal or we need to change your treatment, we will call you to review the results. ? ?Follow-Up: ?At [facility], you and your health needs are our priority.  As part of our continuing mission to provide you with exceptional heart care, we have created designated Provider Care Teams.  These Care Teams include your primary Cardiologist (physician) and Advanced Practice Providers (APPs -  Physician Assistants and Nurse Practitioners) who all work together to provide you with the care you need, when you need it. ? ?We recommend signing up for the patient portal called "MyChart".  Sign up information is provided on this After Visit Summary.  MyChart is used to connect with patients for Virtual Visits (Telemedicine).  Patients are able to view lab/test results, encounter notes, upcoming appointments, etc.  Non-urgent messages can be sent to your provider as well.   ?To learn more about what you can do with MyChart, go to [email or web address].   ? ?Your next appointment:   ?As needed ? ?The format for your next appointment:   ?In Person ? ?Provider:   ?Jenkins Rouge, MD { ? ?Please continue to wear your monitor. ?

## 2021-07-15 NOTE — Progress Notes (Signed)
? ?  Nurse Visit  ?  ?Date of Encounter: 07/15/2021 ?ID: Susan Ford, DOB 06-Sep-1929, MRN [id number] ? ?PCP:  Burnis Medin, MD ?  ?Cardiologist:  Jenkins Rouge, MD  ?Advanced Practice Provider:  No care team member to display ?Electrophysiologist:  None  ? ? ?Visit Details  ? ?VS:  BP 110/62   Pulse (!) 111   Ht '5\' 4"'$  (1.626 m)   Wt 120 lb (54.4 kg)   SpO2 95%   BMI 20.60 kg/m?  , BMI Body mass index is 20.6 kg/m?. ? ?Wt Readings from Last 3 Encounters:  ?07/15/21 120 lb (54.4 kg)  ?07/01/21 120 lb (54.4 kg)  ?06/03/21 121 lb 4.8 oz (55 kg)  ?  ? ?Reason for visit: EKG ?Performed today: Vitals, EKG, Provider consulted:Dr. Ali Lowe, and Education ?Changes (medications, testing, etc.) : Patient is in sinus rhythm.  No changes made at this time. Patient will continue to wear monitor ?Length of Visit: 60 minutes ? ?Medications Adjustments/Labs and Tests Ordered: ?No orders of the defined types were placed in this encounter. ? ?No orders of the defined types were placed in this encounter. ? ? ?Signed, ?Michaelyn Barter, RN  ?07/15/2021 3:01 PM ? ? ? ? ?

## 2021-07-23 ENCOUNTER — Telehealth: Payer: Self-pay | Admitting: Cardiovascular Disease

## 2021-07-23 NOTE — Telephone Encounter (Signed)
Patient is requesting to speak with Landry Dyke. regarding her monitor. She states it is in airplane mode and will not turn on.  ?

## 2021-08-04 NOTE — Telephone Encounter (Signed)
Late entry.Marland KitchenMarland KitchenMarland KitchenPatient walked in to the office right after telephone msg was sent to [place]. After discussing with supervisor and her doctors nurse it was decided patient should mail monitor back. Patient has had difficulty with monitor technology.  ?

## 2021-09-15 ENCOUNTER — Telehealth: Payer: Self-pay

## 2021-09-15 NOTE — Telephone Encounter (Signed)
Forms from [place] at [facility] is in red folder

## 2021-09-16 NOTE — Telephone Encounter (Signed)
Make her an appt    if she cant come in  person we could try a virtual but in person preferred based on the ?s  on the form  such as VS weight etc

## 2021-09-17 NOTE — Telephone Encounter (Signed)
thanks

## 2021-09-17 NOTE — Telephone Encounter (Signed)
Patient was contacted and has appt scheduled on 09/23/21

## 2021-09-23 ENCOUNTER — Ambulatory Visit (INDEPENDENT_AMBULATORY_CARE_PROVIDER_SITE_OTHER): Payer: Medicare Other | Admitting: Internal Medicine

## 2021-09-23 ENCOUNTER — Encounter: Payer: Self-pay | Admitting: Internal Medicine

## 2021-09-23 ENCOUNTER — Telehealth: Payer: Self-pay

## 2021-09-23 VITALS — BP 120/60 | HR 63 | Temp 98.6°F | Ht 64.0 in | Wt 120.6 lb

## 2021-09-23 DIAGNOSIS — Z79899 Other long term (current) drug therapy: Secondary | ICD-10-CM

## 2021-09-23 DIAGNOSIS — E785 Hyperlipidemia, unspecified: Secondary | ICD-10-CM | POA: Diagnosis not present

## 2021-09-23 DIAGNOSIS — M1990 Unspecified osteoarthritis, unspecified site: Secondary | ICD-10-CM | POA: Diagnosis not present

## 2021-09-23 DIAGNOSIS — G609 Hereditary and idiopathic neuropathy, unspecified: Secondary | ICD-10-CM | POA: Diagnosis not present

## 2021-09-23 DIAGNOSIS — Z111 Encounter for screening for respiratory tuberculosis: Secondary | ICD-10-CM | POA: Diagnosis not present

## 2021-09-23 LAB — BASIC METABOLIC PANEL
BUN: 19 mg/dL (ref 6–23)
CO2: 25 mEq/L (ref 19–32)
Calcium: 9.3 mg/dL (ref 8.4–10.5)
Chloride: 102 mEq/L (ref 96–112)
Creatinine, Ser: 0.83 mg/dL (ref 0.40–1.20)
GFR: 62.15 mL/min (ref >60.00)
Glucose, Bld: 99 mg/dL (ref 70–99)
Potassium: 4.5 mEq/L (ref 3.5–5.1)
Sodium: 135 mEq/L (ref 135–145)

## 2021-09-23 NOTE — Patient Instructions (Signed)
Good to see you today . Will complete form and work on getting correct med list .   Lab today to check TB status  for the form.   Otherwise  6 month -12 mos recheck .

## 2021-09-23 NOTE — Telephone Encounter (Signed)
Please attach copy of the med list to the form that is on your desk and also fill in the blank when the QuantiFERON gold result come in under results of TB test.  Otherwise you can copy it and get it to the appropriate place.

## 2021-09-23 NOTE — Progress Notes (Signed)
Chemistry panel is normal kidney function is normal monitoring the Celebrex that you are on.

## 2021-09-23 NOTE — Progress Notes (Unsigned)
Chief Complaint  Patient presents with   Consult    Discuss [facility] paperwork    HPI: Susan Ford 88 y.o. come in for Chronic disease management  ROS: See pertinent positives and negatives per HPI.  Past Medical History:  Diagnosis Date   Arthritis    Cataract 2007   bilateral cat ext    Cholecystitis    Complication of anesthesia Feb 21, 2014   atrial fib after cholecystectomy, lasted a few hours, none since   Dysrhythmia 02-21-2014   after cholecystectomy had atrial fib for a few hours, none since   GERD (gastroesophageal reflux disease)    Had endoscopy no history of Barrett's   Hx of nonmelanoma skin cancer    Unspecified hereditary and idiopathic peripheral neuropathy     Family History  Problem Relation Age of Onset   Neurodegenerative disease Mother        Shy Drager died at 87   Alcohol abuse Other    Heart disease Other     Social History   Socioeconomic History   Marital status: Widowed    Spouse name: Not on file   Number of children: 3   Years of education: 15 years   Highest education level: Associate degree: occupational, Hotel manager, or vocational program  Occupational History   Occupation: Retired    Comment: Therapist, sports  Tobacco Use   Smoking status: Former    Packs/day: 0.25    Years: 7.00    Pack years: 1.75    Types: Cigarettes    Quit date: 04/27/1963    Years since quitting: 58.4   Smokeless tobacco: Never  Substance and Sexual Activity   Alcohol use: Yes    Alcohol/week: 7.0 standard drinks    Types: 7 Glasses of wine per week    Comment: 1 glass of wine daily   Drug use: No    Types: Hydrocodone   Sexual activity: Yes  Other Topics Concern   Not on file  Social History Narrative   Retired Therapist, sports    widowed since    Spring  15    [facility]   Regular exercise former smoker   G3P3   From [place] originally      05/12/2018:   Lives alone on ranch house, but has stairs to garage with "acorn chair" lift    Son in [place], and  two daughters out of state, all of whom are supportive    Worked in NICU, 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 Misses working the floor.          Social Determinants of Health   Financial Resource Strain: Low Risk    Difficulty of Paying Living Expenses: Not hard at all  Food Insecurity: No Food Insecurity   Worried About Charity fundraiser in the Last Year: Never true   [place] in the Last Year: Never true  Transportation Needs: No Transportation Needs   Lack of Transportation (Medical): No   Lack of Transportation (Non-Medical): No  Physical Activity: Inactive   Days of Exercise per Week: 0 days   Minutes of Exercise per Session: 0 min  Stress: No Stress Concern Present   Feeling of Stress : Not at all  Social Connections: Moderately Integrated   Frequency of Communication with Friends and Family: More than three times a week   Frequency of Social Gatherings with Friends and Family: More than three times a week   Attends Religious  Services: More than 4 times per year   Active Member of Clubs or Organizations: Yes   Attends Archivist Meetings: More than 4 times per year   Marital Status: Widowed    Outpatient Medications Prior to Visit  Medication Sig Dispense Refill   acetaminophen (TYLENOL) 500 MG tablet Take 500 mg by mouth every 6 (six) hours as needed for mild pain.      celecoxib (CELEBREX) 200 MG capsule Take 1 capsule (200 mg total) by mouth daily. 90 capsule 0   donepezil (ARICEPT) 10 MG tablet Take 1 tablet (10 mg total) by mouth at bedtime. 30 tablet 11   estradiol (CLIMARA - DOSED IN MG/24 HR) 0.025 mg/24hr patch PLACE 1 PATCH (0.025 MG TOTAL) ONTO THE SKIN ONCE A WEEK ON SATURDAY 12 patch 1   gabapentin (NEURONTIN) 600 MG tablet TAKE 1 TABLET BY MOUTH 4 TIMES DAILY 360 tablet 3   levocetirizine (XYZAL) 5 MG tablet Take 10 mg by mouth at bedtime.     Multiple Vitamin (MULTIVITAMIN) tablet Take 1 tablet by mouth daily after  lunch.     simvastatin (ZOCOR) 10 MG tablet TAKE 1 TABLET BY MOUTH EVERYDAY AT BEDTIME 90 tablet 1   No facility-administered medications prior to visit.     EXAM:  BP 120/60 (BP Location: Left Arm, Patient Position: Sitting, Cuff Size: Normal)   Pulse 63   Temp 98.6 F (37 C) (Oral)   Ht '5\' 4"'$  (1.626 m)   Wt 120 lb 9.6 oz (54.7 kg)   SpO2 98%   BMI 20.70 kg/m   Body mass index is 20.7 kg/m.  GENERAL: vitals reviewed and listed above, alert, oriented, appears well hydrated and in no acute distress HEENT: atraumatic, conjunctiva  clear, no obvious abnormalities on inspection of external nose and ears OP : no lesion edema or exudate  NECK: no obvious masses on inspection palpation  LUNGS: clear to auscultation bilaterally, no wheezes, rales or rhonchi, good air movement CV: HRRR, no clubbing cyanosis or  peripheral edema nl cap refill  MS: moves all extremities without noticeable focal  abnormality PSYCH: pleasant and cooperative, no obvious depression or anxiety Lab Results  Component Value Date   WBC 10.6 (H) 05/05/2021   HGB 12.5 05/05/2021   HCT 37.9 05/05/2021   PLT 295.0 05/05/2021   GLUCOSE 108 (H) 05/05/2021   CHOL 150 05/05/2021   TRIG 191.0 (H) 05/05/2021   HDL 64.30 05/05/2021   LDLDIRECT 153 02/12/2009   LDLCALC 48 05/05/2021   ALT 14 05/05/2021   AST 23 05/05/2021   NA 133 (L) 05/05/2021   K 4.3 05/05/2021   CL 100 05/05/2021   CREATININE 0.73 05/05/2021   BUN 16 05/05/2021   CO2 27 05/05/2021   TSH 1.42 05/05/2021   INR 0.91 07/05/2017   HGBA1C 5.5 05/05/2021   BP Readings from Last 3 Encounters:  09/23/21 120/60  07/15/21 110/62  07/01/21 120/64    ASSESSMENT AND PLAN:  Discussed the following assessment and plan:  No diagnosis found.  -Patient advised to return or notify health care team  if  new concerns arise.  There are no Patient Instructions on file for this visit.   Standley Brooking. Abrie Egloff M.D.

## 2021-09-23 NOTE — Telephone Encounter (Signed)
Spoke with pharmacy staff and requested med list and will be faxed this afternoon

## 2021-09-24 ENCOUNTER — Telehealth: Payer: Self-pay

## 2021-09-24 ENCOUNTER — Encounter: Payer: Self-pay | Admitting: Internal Medicine

## 2021-09-24 ENCOUNTER — Telehealth: Payer: Self-pay | Admitting: Neurology

## 2021-09-24 NOTE — Telephone Encounter (Signed)
Paperwork faxed °

## 2021-09-24 NOTE — Telephone Encounter (Signed)
Pt is asking for a call to discuss donepezil (ARICEPT) 10 MG tablet, not working for her.

## 2021-09-24 NOTE — Telephone Encounter (Signed)
Offered pt 2 available slots pt declined both,  Pt will call back to schedule appt

## 2021-09-26 LAB — QUANTIFERON-TB GOLD PLUS
Mitogen-NIL: >10 IU/mL
NIL: 0.02 IU/mL
QuantiFERON-TB Gold Plus: NEGATIVE
TB1-NIL: 0.01 IU/mL
TB2-NIL: 0 IU/mL

## 2021-09-28 NOTE — Telephone Encounter (Signed)
Pt decided to keep her appt with Judson Roch, NP on 01/19/22

## 2021-09-28 NOTE — Telephone Encounter (Signed)
Pt said Donepezil is not helping. Would like call from nurse.

## 2021-10-06 NOTE — Progress Notes (Signed)
Tb test is negative

## 2021-10-16 NOTE — Telephone Encounter (Signed)
Pt scheduled appt with Maralyn Sago, NP on 10/21/21 at 10:15 am. Pt said still want to keep appt 1 year f/u also.

## 2021-10-20 NOTE — Progress Notes (Signed)
Patient: Susan Ford Date of Birth: [birth year]  Reason for Visit: Follow up for memory History from: Patient, alone Primary Neurologist: Krista Blue  ASSESSMENT AND PLAN 88 y.o. year old female   Chronic low back pain, lumbar radiculopathy, gait abnormality, bilateral foot drop -Has not been able to tolerate Cymbalta, Lyrica, nortriptyline, Lamictal (unknown why) -Continue gabapentin 600 mg 4 times daily  2.  Dementia, worsening memory loss -MoCA was 23/30 today, now living at independent living facility -Will try Exelon 1.5 mg twice daily, she wants to try everything, reports dreams with Aricept/no benefit to memory, rash with Namenda -MRI of the brain in 2019 showed moderate atrophy, moderate supratentorium small vessel disease -Discussed need close observation of driving -Keep appointment with me in September  HISTORY  Susan Ford is a 88 year-old right-handed white female,She was a patient of Dr. Erling Cruz, last visit was May 2014    She had a history of lumbar decompression surgery, presenting with chronic low back pain, mild gait difficulty due to pain.   MRI of the lumbar spine 12/25/2010 shows diffuse spondylitic changes and progression of degenerative changes at L3-4 and L4-5 and modest broad disc bulge at L5-S1. There is evidence of neuroforaminal  stenosis on the left at L4-5. She has pain in her feet and aching from her knees down.   EMG/NCV 12/24/2011 showing progression of peripheral neuropathy versus a prior study 09/13/2011. There was also a  mild overlying acute L5 radiculopathy on the left. She underwent  surgical decompression at L4-5 and L5-S1 with bilateral foraminotomies and microdiscectomy on the left at L4-5 02/01/2012 by Dr. Ellene Route, Surgery helped her.  She still complains of pain at low back pain, waist line, going down her leg, at both leg.   She denies bowel/bladder incontinence    She has difficulty walking, because of low back pain, worst pain in the  morning, she usually take her hydrocodone early morning, then napping for a while, then she can function again.   Previously she has tried Lyrica which caused abdominal cramps,  Cymbalta, caused nausea. She had side effects from nortriptyline 20 mg per night. She is on gabapentin 600, 1-1/2  tablets 4 times per day and complains of memory loss on the medication and now questions any improvement in pain. She uses a cane, but not in her house.    She underwent an epidural steroid injection 10/2011 with improvement in her back pain.    She is independent in activities of daily living including driving a car, cooking,cleaning, shopping, and doing laundry.     UPDATE Sept 26 2022: She lives alone, she is still driving, today her main concern is increased bilateral lower extremity weakness, even after standing or walking short distance, she would feel bilateral lower extremity heaviness, weak, but once she sits down, or lying down, she is not in pain, she denies bowel and bladder incontinence, she does has worsening gait abnormality   Recently was seen by Dr. 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 Today, [place] 23/30. Living at [facility], moved 1 week ago, in her home, was getting difficult to manage a home. Using a walker or cane now, pain to low back. Has to make notes, she drives some, facility brought her. Manages her finances, bills. Stopped Aricept, gave her nightmares. Thinks she had side effect from Lamictal, doesn't remember exactly. Takes gabapentin 600 mg 4  times daily. Wants another memory medication.  REVIEW OF SYSTEMS: Out of a complete 14 system review of symptoms, the patient complains only of the following symptoms, and all other reviewed systems are negative.  See HPI  ALLERGIES: Allergies  Allergen Reactions   Keflex [Cephalexin]     Dizziness   Lyrica [Pregabalin] Other (See Comments)    "High Fever"    Oxytetracycline Other (See Comments)    "High Fever" caused by terramycin   Memantine Rash    HOME MEDICATIONS: Outpatient Medications Prior to Visit  Medication Sig Dispense Refill   acetaminophen (TYLENOL) 500 MG tablet Take 500 mg by mouth every 6 (six) hours as needed for mild pain.      celecoxib (CELEBREX) 200 MG capsule Take 1 capsule (200 mg total) by mouth daily. 90 capsule 0   estradiol (CLIMARA - DOSED IN MG/24 HR) 0.025 mg/24hr patch PLACE 1 PATCH (0.025 MG TOTAL) ONTO THE SKIN ONCE A WEEK ON SATURDAY 12 patch 1   gabapentin (NEURONTIN) 600 MG tablet TAKE 1 TABLET BY MOUTH 4 TIMES DAILY 360 tablet 3   Multiple Vitamin (MULTIVITAMIN) tablet Take 1 tablet by mouth daily after lunch.     simvastatin (ZOCOR) 10 MG tablet TAKE 1 TABLET BY MOUTH EVERYDAY AT BEDTIME 90 tablet 1   donepezil (ARICEPT) 10 MG tablet Take 1 tablet (10 mg total) by mouth at bedtime. 30 tablet 11   levocetirizine (XYZAL) 5 MG tablet Take 10 mg by mouth at bedtime.     No facility-administered medications prior to visit.    PAST MEDICAL HISTORY: Past Medical History:  Diagnosis Date   Arthritis    Cataract 2007   bilateral cat ext    Cholecystitis    Complication of anesthesia Feb 21, 2014   atrial fib after cholecystectomy, lasted a few hours, none since   Dysrhythmia 02-21-2014   after cholecystectomy had atrial fib for a few hours, none since   GERD (gastroesophageal reflux disease)    Had endoscopy no history of Barrett's   Hx of nonmelanoma skin cancer    Unspecified hereditary and idiopathic peripheral neuropathy     PAST  SURGICAL HISTORY: Past Surgical History:  Procedure Laterality Date   ABDOMINAL HYSTERECTOMY  15 yrs ago   complete   BREAST CYST ASPIRATION     CHOLECYSTECTOMY N/A 02/21/2014   Procedure: LAPAROSCOPIC CHOLECYSTECTOMY WITH INTRAOPERATIVE CHOLANGIOGRAM;  Surgeon: Doreen Salvage, MD;  Location: [facility];  Service: General;  Laterality: N/A;   ENDOSCOPIC RETROGRADE CHOLANGIOPANCREATOGRAPHY (ERCP) WITH PROPOFOL N/A 05/29/2014   Procedure: ENDOSCOPIC RETROGRADE CHOLANGIOPANCREATOGRAPHY (ERCP) WITH PROPOFOL;  Surgeon: Arta Silence, MD;  Location: [facility];  Service: Endoscopy;  Laterality: N/A;   ERCP N/A 02/24/2014   Procedure: ENDOSCOPIC RETROGRADE CHOLANGIOPANCREATOGRAPHY (ERCP);  Surgeon: Arta Silence, MD;  [facility];  Service: Endoscopy;  Laterality: N/A;   EYE SURGERY Bilateral 2007   both eyes cataracts with lens replacments   LUMBAR LAMINECTOMY/DECOMPRESSION MICRODISCECTOMY  02/01/2012   Procedure: LUMBAR LAMINECTOMY/DECOMPRESSION MICRODISCECTOMY 2 LEVELS;  Surgeon: Kristeen Miss, MD;  [facility];  Service: Neurosurgery;  Laterality: Left;  Left Lumbar Four-Five Lumbar Five-Sacral One Laminectomies/Foraminotomies/Microscope  SPYGLASS CHOLANGIOSCOPY N/A 05/29/2014   Procedure: SPYGLASS CHOLANGIOSCOPY;  Surgeon: Arta Silence, MD;  Location: [facility];  Service: Endoscopy;  Laterality: N/A;   TONSILLECTOMY  age 42    FAMILY HISTORY: Family History  Problem Relation Age of Onset   Neurodegenerative disease Mother        Shy Drager died at 45   Alcohol abuse Other    Heart disease Other     SOCIAL HISTORY: Social History   Socioeconomic History   Marital status: Widowed    Spouse name: Not on file   Number of children: 3   Years of education: 15 years   Highest education level: Associate degree: occupational, Hotel manager, or vocational program  Occupational History   Occupation: Retired    Comment: Therapist, sports  Tobacco Use   Smoking status: Former    Packs/day:  0.25    Years: 7.00    Total pack years: 1.75    Types: Cigarettes    Quit date: 04/27/1963    Years since quitting: 58.5   Smokeless tobacco: Never  Substance and Sexual Activity   Alcohol use: Yes    Alcohol/week: 7.0 standard drinks of alcohol    Types: 7 Glasses of wine per week    Comment: 1 glass of wine daily   Drug use: No    Types: Hydrocodone   Sexual activity: Yes  Other Topics Concern   Not on file  Social History Narrative   Retired Therapist, sports    widowed since    Spring  15    [facility] [employer] of 1   Regular exercise former smoker   G3P3   From [place] originally      05/12/2018:   Lives alone on ranch house, but has stairs to garage with "acorn chair" lift    Son in [place], and two daughters out of state, all of whom are supportive    Worked in NICU, 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 Misses working the floor.          Social Determinants of Health   Financial Resource Strain: Low Risk  (06/03/2021)   Overall Financial Resource Strain (CARDIA)    Difficulty of Paying Living Expenses: Not hard at all  Food Insecurity: No Food Insecurity (06/03/2021)   Hunger Vital Sign    Worried About Running Out of Food in the Last Year: Never true    Ran Out of Food in the Last Year: Never true  Transportation Needs: No Transportation Needs (06/03/2021)   PRAPARE - Hydrologist (Medical): No    Lack of Transportation (Non-Medical): No  Physical Activity: Inactive (06/03/2021)   Exercise Vital Sign    Days of Exercise per Week: 0 days    Minutes of Exercise per Session: 0 min  Stress: No Stress Concern Present (06/03/2021)   [place]    Feeling of Stress : Not at all  Social Connections: Moderately Integrated (06/03/2021)   Social Connection and Isolation Panel [NHANES]    Frequency of Communication with Friends and Family: More than three times a week    Frequency of Social  Gatherings with Friends and Family: More than three times a week    Attends Religious Services: More than 4 times per year    Active Member of [employer] or Organizations: Yes    Attends Archivist Meetings: More than 4 times per year    Marital Status: Widowed  Intimate  Partner Violence: Not At Risk (06/03/2021)   Humiliation, Afraid, Rape, and Kick questionnaire    Fear of Current or Ex-Partner: No    Emotionally Abused: No    Physically Abused: No    Sexually Abused: No   PHYSICAL EXAM  Vitals:   10/21/21 1023  BP: 119/70  Pulse: 68  Weight: 120 lb 8 oz (54.7 kg)  Height: '5\' 4"'$  (1.626 m)   Body mass index is 20.68 kg/m.    10/21/2021   10:26 AM 01/19/2021    1:00 PM  Montreal Cognitive Assessment   Visuospatial/ Executive (0/5) 3 2  Naming (0/3) 2 2  Attention: Read list of digits (0/2) 2 2  Attention: Read list of letters (0/1) 0 1  Attention: Serial 7 subtraction starting at 100 (0/3) 3 1  Language: Repeat phrase (0/2) 2 2  Language : Fluency (0/1) 0 0  Abstraction (0/2) 2 2  Delayed Recall (0/5) 3 0  Orientation (0/6) 6 6  Total 23 18   Generalized: Well developed, in no acute distress  Neurological examination  Mentation: Alert oriented to time, place, history taking. Follows all commands speech and language fluent Cranial nerve II-XII: Pupils were equal round reactive to light. Extraocular movements were full, visual field were full on confrontational test. Facial sensation and strength were normal. Head turning and shoulder shrug  were normal and symmetric. Motor: No significant muscle weakness noted Sensory: Sensory testing is intact to soft touch on all 4 extremities. No evidence of extinction is noted.  Coordination: Cerebellar testing reveals good finger-nose-finger and heel-to-shin bilaterally.  Gait and station: Gait is wide-based, cautious, uses rolling walker, bilateral foot drop Reflexes: Deep tendon reflexes are symmetric and normal bilaterally.    DIAGNOSTIC DATA (LABS, IMAGING, TESTING) - I reviewed patient records, labs, notes, testing and imaging myself where available.  Lab Results  Component Value Date   WBC 10.6 (H) 05/05/2021   HGB 12.5 05/05/2021   HCT 37.9 05/05/2021   MCV 96.4 05/05/2021   PLT 295.0 05/05/2021      Component Value Date/Time   NA 135 09/23/2021 1154   NA 133 (A) 07/16/2016 0000   K 4.5 09/23/2021 1154   CL 102 09/23/2021 1154   CO2 25 09/23/2021 1154   GLUCOSE 99 09/23/2021 1154   BUN 19 09/23/2021 1154   BUN 16 07/16/2016 0000   CREATININE 0.83 09/23/2021 1154   CREATININE 0.62 02/06/2020 1140   CALCIUM 9.3 09/23/2021 1154   PROT 7.2 05/05/2021 1052   ALBUMIN 4.2 05/05/2021 1052   AST 23 05/05/2021 1052   ALT 14 05/05/2021 1052   ALKPHOS 63 05/05/2021 1052   BILITOT 0.4 05/05/2021 1052   GFRNONAA 81 02/06/2020 1140   GFRAA 93 02/06/2020 1140   Lab Results  Component Value Date   CHOL 150 05/05/2021   HDL 64.30 05/05/2021   LDLCALC 48 05/05/2021   LDLDIRECT 153 02/12/2009   TRIG 191.0 (H) 05/05/2021   CHOLHDL 2 05/05/2021   Lab Results  Component Value Date   HGBA1C 5.5 05/05/2021   Lab Results  Component Value Date   VITAMINB12 1,616 (H) 01/19/2021   Lab Results  Component Value Date   TSH 1.42 05/05/2021    Butler Denmark, AGNP-C, DNP 10/21/2021, 10:40 AM [facility] [address], [place], [place] [address] [phone]

## 2021-10-21 ENCOUNTER — Ambulatory Visit (INDEPENDENT_AMBULATORY_CARE_PROVIDER_SITE_OTHER): Payer: Medicare Other | Admitting: Neurology

## 2021-10-21 ENCOUNTER — Encounter: Payer: Self-pay | Admitting: Neurology

## 2021-10-21 VITALS — BP 119/70 | HR 68 | Ht 64.0 in | Wt 120.5 lb

## 2021-10-21 DIAGNOSIS — G3184 Mild cognitive impairment, so stated: Secondary | ICD-10-CM

## 2021-10-21 DIAGNOSIS — M48061 Spinal stenosis, lumbar region without neurogenic claudication: Secondary | ICD-10-CM | POA: Diagnosis not present

## 2021-10-21 DIAGNOSIS — R269 Unspecified abnormalities of gait and mobility: Secondary | ICD-10-CM

## 2021-10-21 MED ORDER — RIVASTIGMINE TARTRATE 1.5 MG PO CAPS
1.5000 mg | ORAL_CAPSULE | Freq: Two times a day (BID) | ORAL | 5 refills | Status: DC
Start: 2021-10-21 — End: 2022-01-19

## 2021-10-21 NOTE — Patient Instructions (Addendum)
Meds ordered this encounter  Medications   rivastigmine (EXELON) 1.5 MG capsule    Sig: Take 1 capsule (1.5 mg total) by mouth 2 (two) times daily.    Dispense:  60 capsule    Refill:  5   Use caution with driving, will need to evaluate this ongoing

## 2021-11-27 ENCOUNTER — Other Ambulatory Visit: Payer: Self-pay | Admitting: Internal Medicine

## 2021-12-08 ENCOUNTER — Telehealth: Payer: Self-pay | Admitting: Neurology

## 2021-12-08 NOTE — Telephone Encounter (Signed)
Pt called stating that the rivastigmine (EXELON) 1.5 MG capsule is not working for her and she has been on it for a month and a half. Pt would like to discuss.

## 2021-12-08 NOTE — Telephone Encounter (Signed)
I spoke with the patient. She denies any side effects of rivastigmine. She verified she has been taking rivastigmine 1.5 mg BID for one and a half months. She states she continues to struggle with forgetfulness.  She was started on Exelon 1.5 mg BID on 10/21/2021. She has previously tried Aricept and reported dreams/no benefit to memory. She had a rash with Namenda.  The patient has an upcoming appointment with Butler Denmark, NP on 01/19/2022. I advised her to remain on Exelon 1.5 mg twice daily until her appointment to assure maximum benefit is being received from the medication. She was agreeable to the plan.  I advised her to call back with any further questions, concerns, or worsening issues. She verbalized understanding of the plan and expressed appreciation for the call.

## 2021-12-28 ENCOUNTER — Other Ambulatory Visit: Payer: Self-pay | Admitting: Neurology

## 2021-12-28 ENCOUNTER — Other Ambulatory Visit: Payer: Self-pay | Admitting: Internal Medicine

## 2021-12-29 NOTE — Telephone Encounter (Signed)
Rx refilled as per last OV note. 

## 2021-12-31 ENCOUNTER — Telehealth: Payer: Self-pay | Admitting: Neurology

## 2021-12-31 NOTE — Telephone Encounter (Signed)
I did not expect she would see any major changes with the Exelon. We can discuss at her next visit. She has had side effect or no benefit previously with Namenda or Aricept.

## 2021-12-31 NOTE — Telephone Encounter (Signed)
Pt has f/u on 01/19/2022 would you like to wait until f/u to further discuss?

## 2021-12-31 NOTE — Telephone Encounter (Signed)
Pt states rivastigmine (EXELON) 1.5 MG capsule, isn't working.  Pt would like a call to discuss.

## 2022-01-04 ENCOUNTER — Telehealth: Payer: Self-pay | Admitting: Internal Medicine

## 2022-01-04 NOTE — Telephone Encounter (Signed)
Pt called to request a refill of the following:  estradiol (CLIMARA - DOSED IN MG/24 HR) 0.025 mg/24hr patch  LOV:  09/23/2021  Please advise.  [id number] [place] Phone:  [phone] Fax:  [phone]

## 2022-01-06 ENCOUNTER — Encounter: Payer: Self-pay | Admitting: Internal Medicine

## 2022-01-06 ENCOUNTER — Telehealth: Payer: Self-pay | Admitting: Neurology

## 2022-01-06 MED ORDER — ESTRADIOL 0.025 MG/24HR TD PTWK: MEDICATED_PATCH | TRANSDERMAL | 1 refills | Status: DC

## 2022-01-06 NOTE — Telephone Encounter (Signed)
Pt called stating that the medication rivastigmine (EXEL ON) 1.5 MG capsule is not working for her and she would like to discuss with provider.

## 2022-01-06 NOTE — Telephone Encounter (Signed)
Rx sent in to requested pharmacy.  

## 2022-01-06 NOTE — Telephone Encounter (Signed)
Spoke to pt, informed her that Susan Ford will discuss medications during OV on 01/22/22.

## 2022-01-07 ENCOUNTER — Other Ambulatory Visit: Payer: Self-pay | Admitting: Neurology

## 2022-01-07 DIAGNOSIS — Z23 Encounter for immunization: Secondary | ICD-10-CM | POA: Diagnosis not present

## 2022-01-18 NOTE — Progress Notes (Unsigned)
Patient: Susan Ford Date of Birth: 07-29-1929  Reason for Visit: Follow up for memory History from: Patient, alone Primary Neurologist: Krista Blue  ASSESSMENT AND PLAN 88 y.o. year old female   Chronic low back pain, lumbar radiculopathy, gait abnormality, bilateral foot drop -Has not been able to tolerate Cymbalta, Lyrica, nortriptyline, Lamictal (unknown why) -Continue gabapentin 600 mg 4 times daily, will address at next visit  2.  Dementia, worsening memory loss -MoCA 20/30 today, no other new neuro symptoms -No benefit with Exelon, she wishes to discontinue -Aricept resulted in dreams, was not helpful -She had a rash when taking Namenda -Will continue to monitor the memory, encouraged exercise, brain stimulating activities, healthy eating, drinking plenty of water -MRI of the brain in 2019 showed moderate atrophy, moderate supratentorium small vessel disease -I have recommended against driving, follow-up here in 6 months or sooner if needed  HISTORY  Susan Ford is a 88 year-old right-handed white female,She was a patient of Dr. Erling Cruz, last visit was May 2014    She had a history of lumbar decompression surgery, presenting with chronic low back pain, mild gait difficulty due to pain.   MRI of the lumbar spine 12/25/2010 shows diffuse spondylitic changes and progression of degenerative changes at L3-4 and L4-5 and modest broad disc bulge at L5-S1. There is evidence of neuroforaminal  stenosis on the left at L4-5. She has pain in her feet and aching from her knees down.   EMG/NCV 12/24/2011 showing progression of peripheral neuropathy versus a prior study 09/13/2011. There was also a  mild overlying acute L5 radiculopathy on the left. She underwent  surgical decompression at L4-5 and L5-S1 with bilateral foraminotomies and microdiscectomy on the left at L4-5 02/01/2012 by Dr. Ellene Route, Surgery helped her.  She still complains of pain at low back pain, waist line, going down her  leg, at both leg.   She denies bowel/bladder incontinence    She has difficulty walking, because of low back pain, worst pain in the morning, she usually take her hydrocodone early morning, then napping for a while, then she can function again.   Previously she has tried Lyrica which caused abdominal cramps,  Cymbalta, caused nausea. She had side effects from nortriptyline 20 mg per night. She is on gabapentin 600, 1-1/2  tablets 4 times per day and complains of memory loss on the medication and now questions any improvement in pain. She uses a cane, but not in her house.    She underwent an epidural steroid injection 10/2011 with improvement in her back pain.    She is independent in activities of daily living including driving a car, cooking,cleaning, shopping, and doing laundry.     UPDATE Sept 26 2022: She lives alone, she is still driving, today her main concern is increased bilateral lower extremity weakness, even after standing or walking short distance, she would feel bilateral lower extremity heaviness, weak, but once she sits down, or lying down, she is not in pain, she denies bowel and bladder incontinence, she does has worsening gait abnormality   Recently was seen by Dr. 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 Today, [place] 23/30. Living at [facility], moved 1 week ago, in her home, was getting difficult to manage a home. Using a walker or cane now, pain to low back. Has to make notes, she drives some, facility brought her. Manages her finances,  bills. Stopped Aricept, gave her nightmares. Thinks she had side effect from Lamictal, doesn't remember exactly. Takes gabapentin 600 mg 4 times daily. Wants another memory medication.  Update January 19, 2022 SS: MOCA is 20/30. Still living at assisted living. A friend brought her today. Has not noticed any change with the Exelon, wants to stop it. Trouble with short term. Does drive herself to her appointments. No falls, no new neuro symptoms. Has good appetite, weight is stable. Using walker today. Doesn't do much exercise, hurts her back.  REVIEW OF SYSTEMS: Out of a complete 14 system review of symptoms, the patient complains only of the following symptoms, and all other reviewed systems are negative.  See HPI  ALLERGIES: Allergies  Allergen Reactions   Keflex [Cephalexin]     Dizziness   Lyrica [Pregabalin] Other (See Comments)    "High Fever"    Oxytetracycline Other (See Comments)    "High Fever" caused by terramycin   Memantine Rash    HOME MEDICATIONS: Outpatient Medications Prior to Visit  Medication Sig Dispense Refill   acetaminophen (TYLENOL) 500 MG tablet Take 500 mg by mouth every 6 (six) hours as needed for mild pain.      celecoxib (CELEBREX) 200 MG capsule Take 1 capsule (200 mg total) by mouth daily. 90 capsule 0   estradiol (CLIMARA - DOSED IN MG/24 HR) 0.025 mg/24hr patch PLACE 1 PATCH (0.025 MG TOTAL) ONTO THE SKIN ONCE A WEEK ON SATURDAY 12 patch 1   gabapentin (NEURONTIN) 600 MG tablet TAKE 1 TABLET BY MOUTH FOUR TIMES A DAY 360 tablet 3   Multiple Vitamin (MULTIVITAMIN) tablet Take 1 tablet by mouth daily after lunch.     simvastatin (ZOCOR) 10 MG tablet TAKE 1 TABLET BY MOUTH EVERYDAY AT BEDTIME 90 tablet 1   rivastigmine (EXELON) 1.5 MG capsule Take 1 capsule (1.5 mg total) by mouth 2 (two) times daily. 60 capsule 5   No facility-administered medications prior to visit.    PAST MEDICAL HISTORY: Past Medical History:  Diagnosis Date   Arthritis     Cataract 2007   bilateral cat ext    Cholecystitis    Complication of anesthesia Feb 21, 2014   atrial fib after cholecystectomy, lasted a few hours, none since   Dysrhythmia 02-21-2014   after cholecystectomy had atrial fib for a few hours, none since   GERD (gastroesophageal reflux disease)    Had endoscopy no history of Barrett's   Hx of nonmelanoma skin cancer    Unspecified hereditary and idiopathic peripheral neuropathy     PAST SURGICAL HISTORY: Past Surgical History:  Procedure Laterality Date   ABDOMINAL HYSTERECTOMY  15 yrs ago   complete   BREAST CYST ASPIRATION     CHOLECYSTECTOMY N/A 02/21/2014   Procedure: LAPAROSCOPIC CHOLECYSTECTOMY WITH INTRAOPERATIVE CHOLANGIOGRAM;  Surgeon: Doreen Salvage, MD;  Location: [facility];  Service: General;  Laterality: N/A;   ENDOSCOPIC RETROGRADE CHOLANGIOPANCREATOGRAPHY (ERCP) WITH PROPOFOL N/A 05/29/2014   Procedure: ENDOSCOPIC RETROGRADE CHOLANGIOPANCREATOGRAPHY (ERCP) WITH PROPOFOL;  Surgeon: Arta Silence, MD;  Location: [facility];  Service: Endoscopy;  Laterality: N/A;   ERCP N/A 02/24/2014   Procedure: ENDOSCOPIC RETROGRADE  CHOLANGIOPANCREATOGRAPHY (ERCP);  Surgeon: Arta Silence, MD;  Location: [facility] ENDOSCOPY;  Service: Endoscopy;  Laterality: N/A;   EYE SURGERY Bilateral 2007   both eyes cataracts with lens replacments   LUMBAR LAMINECTOMY/DECOMPRESSION MICRODISCECTOMY  02/01/2012   Procedure: LUMBAR LAMINECTOMY/DECOMPRESSION MICRODISCECTOMY 2 LEVELS;  Surgeon: Kristeen Miss, MD;  Location: [place] [facility];  Service: Neurosurgery;  Laterality: Left;  Left Lumbar Four-Five Lumbar Five-Sacral One Laminectomies/Foraminotomies/Microscope   SPYGLASS CHOLANGIOSCOPY N/A 05/29/2014   Procedure: SPYGLASS CHOLANGIOSCOPY;  Surgeon: Arta Silence, MD;  Location: [facility];  Service: Endoscopy;  Laterality: N/A;   TONSILLECTOMY  age 37    FAMILY HISTORY: Family History  Problem Relation Age of Onset   Neurodegenerative disease Mother        Shy  Drager died at 35   Alcohol abuse Other    Heart disease Other     SOCIAL HISTORY: Social History   Socioeconomic History   Marital status: Widowed    Spouse name: Not on file   Number of children: 3   Years of education: 15 years   Highest education level: Associate degree: occupational, Hotel manager, or vocational program  Occupational History   Occupation: Retired    Comment: Therapist, sports  Tobacco Use   Smoking status: Former    Packs/day: 0.25    Years: 7.00    Total pack years: 1.75    Types: Cigarettes    Quit date: 04/27/1963    Years since quitting: 58.7   Smokeless tobacco: Never  Substance and Sexual Activity   Alcohol use: Yes    Alcohol/week: 7.0 standard drinks of alcohol    Types: 7 Glasses of wine per week    Comment: 1 glass of wine daily   Drug use: No    Types: Hydrocodone   Sexual activity: Yes  Other Topics Concern   Not on file  Social History Narrative   Retired Therapist, sports    widowed since    Spring  15    [facility] of [facility]   Regular exercise former smoker   G3P3   From [place] originally      05/12/2018:   Lives alone on ranch house, but has stairs to garage with "acorn chair" lift    Son in [place], and two daughters out of state, all of whom are supportive    Worked in NICU, 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 Misses working the floor.          Social Determinants of Health   Financial Resource Strain: Low Risk  (06/03/2021)   Overall Financial Resource Strain (CARDIA)    Difficulty of Paying Living Expenses: Not hard at all  Food Insecurity: No Food Insecurity (06/03/2021)   Hunger Vital Sign    Worried About Running Out of Food in the Last Year: Never true    Ran Out of Food in the Last Year: Never true  Transportation Needs: No Transportation Needs (06/03/2021)   PRAPARE - Hydrologist (Medical): No    Lack of Transportation (Non-Medical): No  Physical Activity: Inactive (06/03/2021)   Exercise Vital Sign     Days of Exercise per Week: 0 days    Minutes of Exercise per Session: 0 min  Stress: No Stress Concern Present (06/03/2021)   [place]    Feeling of Stress : Not at all  Social Connections: Moderately Integrated (06/03/2021)   Social Connection and Isolation Panel [NHANES]    Frequency of  Communication with Friends and Family: More than three times a week    Frequency of Social Gatherings with Friends and Family: More than three times a week    Attends Religious Services: More than 4 times per year    Active Member of [employer] or Organizations: Yes    Attends Archivist Meetings: More than 4 times per year    Marital Status: Widowed  Intimate Partner Violence: Not At Risk (06/03/2021)   Humiliation, Afraid, Rape, and Kick questionnaire    Fear of Current or Ex-Partner: No    Emotionally Abused: No    Physically Abused: No    Sexually Abused: No   PHYSICAL EXAM  Vitals:   01/19/22 1343  BP: 119/84  Pulse: 77  Weight: 120 lb (54.4 kg)  Height: '5\' 4"'$  (1.626 m)    Body mass index is 20.6 kg/m.    01/19/2022    1:50 PM 10/21/2021   10:26 AM 01/19/2021    1:00 PM  Montreal Cognitive Assessment   Visuospatial/ Executive (0/5) '3 3 2  '$ Naming (0/3) '2 2 2  '$ Attention: Read list of digits (0/2) '1 2 2  '$ Attention: Read list of letters (0/1) 1 0 1  Attention: Serial 7 subtraction starting at 100 (0/3) '3 3 1  '$ Language: Repeat phrase (0/2) '1 2 2  '$ Language : Fluency (0/1) 1 0 0  Abstraction (0/2) '2 2 2  '$ Delayed Recall (0/5) 0 3 0  Orientation (0/6) '6 6 6  '$ Total '20 23 18   '$ Generalized: Well developed, in no acute distress, well-dressed and appearing Neurological examination  Mentation: Alert oriented to time, place, history taking. Follows all commands speech and language fluent Cranial nerve II-XII: Pupils were equal round reactive to light. Extraocular movements were full, visual field were full on confrontational  test. Facial sensation and strength were normal. Head turning and shoulder shrug  were normal and symmetric. Motor: No significant muscle weakness noted Sensory: Sensory testing is intact to soft touch on all 4 extremities. No evidence of extinction is noted.  Coordination: Cerebellar testing reveals good finger-nose-finger and heel-to-shin bilaterally.  Gait and station: Gait is wide-based, cautious, uses rolling walker, bilateral foot drop Reflexes: Deep tendon reflexes are symmetric and normal bilaterally.   DIAGNOSTIC DATA (LABS, IMAGING, TESTING) - I reviewed patient records, labs, notes, testing and imaging myself where available.  Lab Results  Component Value Date   WBC 10.6 (H) 05/05/2021   HGB 12.5 05/05/2021   HCT 37.9 05/05/2021   MCV 96.4 05/05/2021   PLT 295.0 05/05/2021      Component Value Date/Time   NA 135 09/23/2021 1154   NA 133 (A) 07/16/2016 0000   K 4.5 09/23/2021 1154   CL 102 09/23/2021 1154   CO2 25 09/23/2021 1154   GLUCOSE 99 09/23/2021 1154   BUN 19 09/23/2021 1154   BUN 16 07/16/2016 0000   CREATININE 0.83 09/23/2021 1154   CREATININE 0.62 02/06/2020 1140   CALCIUM 9.3 09/23/2021 1154   PROT 7.2 05/05/2021 1052   ALBUMIN 4.2 05/05/2021 1052   AST 23 05/05/2021 1052   ALT 14 05/05/2021 1052   ALKPHOS 63 05/05/2021 1052   BILITOT 0.4 05/05/2021 1052   GFRNONAA 81 02/06/2020 1140   GFRAA 93 02/06/2020 1140   Lab Results  Component Value Date   CHOL 150 05/05/2021   HDL 64.30 05/05/2021   LDLCALC 48 05/05/2021   LDLDIRECT 153 02/12/2009   TRIG 191.0 (H) 05/05/2021   CHOLHDL 2 05/05/2021  Lab Results  Component Value Date   HGBA1C 5.5 05/05/2021   Lab Results  Component Value Date   VITAMINB12 1,616 (H) 01/19/2021   Lab Results  Component Value Date   TSH 1.42 05/05/2021    Butler Denmark, AGNP-C, DNP 01/19/2022, 2:21 PM [facility] [address] [place], [place] [address] [phone]

## 2022-01-19 ENCOUNTER — Ambulatory Visit (INDEPENDENT_AMBULATORY_CARE_PROVIDER_SITE_OTHER): Payer: Medicare Other | Admitting: Neurology

## 2022-01-19 ENCOUNTER — Encounter: Payer: Self-pay | Admitting: Neurology

## 2022-01-19 VITALS — BP 119/84 | HR 77 | Ht 64.0 in | Wt 120.0 lb

## 2022-01-19 DIAGNOSIS — G301 Alzheimer's disease with late onset: Secondary | ICD-10-CM | POA: Diagnosis not present

## 2022-01-19 DIAGNOSIS — F02B0 Dementia in other diseases classified elsewhere, moderate, without behavioral disturbance, psychotic disturbance, mood disturbance, and anxiety: Secondary | ICD-10-CM | POA: Diagnosis not present

## 2022-01-19 NOTE — Patient Instructions (Addendum)
You can stop the Exelon since it is not helping We will see you back in 6 months Continue brain stimulating activities, exercises  Drink water, eat healthy

## 2022-01-23 ENCOUNTER — Ambulatory Visit (HOSPITAL_COMMUNITY)
Admission: EM | Admit: 2022-01-23 | Discharge: 2022-01-23 | Disposition: A | Discharge status: Home / Self Care | Payer: Medicare Other | Attending: Internal Medicine | Admitting: Internal Medicine

## 2022-01-23 ENCOUNTER — Encounter (HOSPITAL_COMMUNITY): Payer: Self-pay

## 2022-01-23 DIAGNOSIS — N3000 Acute cystitis without hematuria: Secondary | ICD-10-CM

## 2022-01-23 LAB — POCT URINALYSIS DIPSTICK, ED / UC
Bilirubin Urine: NEGATIVE
Glucose, UA: NEGATIVE mg/dL
Hgb urine dipstick: NEGATIVE
Ketones, ur: NEGATIVE mg/dL
Nitrite: POSITIVE — AB
Protein, ur: NEGATIVE mg/dL
Specific Gravity, Urine: 1.025 (ref 1.005–1.030)
Urobilinogen, UA: 0.2 mg/dL (ref 0.0–1.0)
pH: 6 (ref 5.0–8.0)

## 2022-01-23 MED ORDER — CIPROFLOXACIN HCL 500 MG PO TABS: 500.0000 mg | ORAL_TABLET | Freq: Two times a day (BID) | ORAL | 0 refills | Status: AC

## 2022-01-23 NOTE — ED Triage Notes (Signed)
Pt is here for a possible UTI, pt states she has been having urgency and frequency urination 3days pt took two extra strength tylenols at 8am today

## 2022-01-23 NOTE — ED Provider Notes (Signed)
[facility]    CSN: [id number] Arrival date & time: 01/23/22  1153      History   Chief Complaint Chief Complaint  Patient presents with   Dysuria    HPI Susan Ford is a 88 y.o. female.   Patient presents with urinary frequency and urgency for 3 days, endorses a low-grade fever of 100.0 this morning.  Has attempted use of Tylenol which has been minimally helpful.  Denies dysuria, hematuria, lower abdominal pain or pressure, flank pain, fever, chills or vaginal symptoms.   Past Medical History:  Diagnosis Date   Arthritis    Cataract 2007   bilateral cat ext    Cholecystitis    Complication of anesthesia Feb 21, 2014   atrial fib after cholecystectomy, lasted a few hours, none since   Dysrhythmia 02-21-2014   after cholecystectomy had atrial fib for a few hours, none since   GERD (gastroesophageal reflux disease)    Had endoscopy no history of Barrett's   Hx of nonmelanoma skin cancer    Unspecified hereditary and idiopathic peripheral neuropathy     Patient Active Problem List   Diagnosis Date Noted   Dementia ([place]) 10/03/2017   Gait abnormality 06/29/2016   Chronic bilateral low back pain without sciatica 06/29/2016   Memory loss 10-17-15   Death of husband 04/13/2014   Bereavement due to life event April 13, 2014   Absolute anemia 04/08/2014   Atrial flutter ([place]) 02/23/2014   Acute calculous cholecystitis 02/21/2014   Spinal stenosis of lumbar region 01/04/2014   Recent bereavement 10/25/2013   Arthritis 10/25/2013   Hyperlipidemia 10/23/2013   Hereditary and idiopathic peripheral neuropathy    Nausea alone 07/14/2012   Neuropathy, peripheral 05/05/2011   High risk medication use 05/05/2011   Subcutaneous emphysema ([place]) 05/05/2011   Cough 04/03/2011   Neuropathy 04/03/2011   Osteoarthritis 05/15/2010   STIFFNESS OF JOINT NEC ANKLE AND FOOT 05/15/2010   GANGLION CYST, WRIST, RIGHT 05/15/2010   VERTIGO, BENIGN PAROXYSMAL POSITION  01/31/2007    Past Surgical History:  Procedure Laterality Date   ABDOMINAL HYSTERECTOMY  15 yrs ago   complete   BREAST CYST ASPIRATION     CHOLECYSTECTOMY N/A 02/21/2014   Procedure: LAPAROSCOPIC CHOLECYSTECTOMY WITH INTRAOPERATIVE CHOLANGIOGRAM;  Surgeon: Doreen Salvage, MD;  Location: [facility];  Service: General;  Laterality: N/A;   ENDOSCOPIC RETROGRADE CHOLANGIOPANCREATOGRAPHY (ERCP) WITH PROPOFOL N/A 05/29/2014   Procedure: ENDOSCOPIC RETROGRADE CHOLANGIOPANCREATOGRAPHY (ERCP) WITH PROPOFOL;  Surgeon: Arta Silence, MD;  Location: [facility];  Service: Endoscopy;  Laterality: N/A;   ERCP N/A 02/24/2014   Procedure: ENDOSCOPIC RETROGRADE CHOLANGIOPANCREATOGRAPHY (ERCP);  Surgeon: Arta Silence, MD;  Location: [facility];  Service: Endoscopy;  Laterality: N/A;   EYE SURGERY Bilateral 2007   both eyes cataracts with lens replacments   LUMBAR LAMINECTOMY/DECOMPRESSION MICRODISCECTOMY  02/01/2012   Procedure: LUMBAR LAMINECTOMY/DECOMPRESSION MICRODISCECTOMY 2 LEVELS;  Surgeon: Kristeen Miss, MD;  Location: [place] [facility];  Service: Neurosurgery;  Laterality: Left;  Left Lumbar Four-Five Lumbar Five-Sacral One Laminectomies/Foraminotomies/Microscope   SPYGLASS CHOLANGIOSCOPY N/A 05/29/2014   Procedure: SPYGLASS CHOLANGIOSCOPY;  Surgeon: Arta Silence, MD;  Location: [facility];  Service: Endoscopy;  Laterality: N/A;   TONSILLECTOMY  age 22    OB History   No obstetric history on file.      Home Medications    Prior to Admission medications   Medication Sig Start Date End Date Taking? Authorizing Provider  acetaminophen (TYLENOL) 500 MG tablet Take 500 mg by mouth every 6 (six) hours as  needed for mild pain.     [provider]  celecoxib (CELEBREX) 200 MG capsule Take 1 capsule (200 mg total) by mouth daily. 09/07/16   Panosh, Standley Brooking, MD  estradiol (CLIMARA - DOSED IN MG/24 HR) 0.025 mg/24hr patch PLACE 1 PATCH (0.025 MG TOTAL) ONTO THE SKIN ONCE A WEEK ON SATURDAY 01/06/22    Panosh, Standley Brooking, MD  gabapentin (NEURONTIN) 600 MG tablet TAKE 1 TABLET BY MOUTH FOUR TIMES A DAY 12/29/21   Marcial Pacas, MD  Multiple Vitamin (MULTIVITAMIN) tablet Take 1 tablet by mouth daily after lunch.    [provider]  simvastatin (ZOCOR) 10 MG tablet TAKE 1 TABLET BY MOUTH EVERYDAY AT BEDTIME 12/30/21   Panosh, Standley Brooking, MD    Family History Family History  Problem Relation Age of Onset   Neurodegenerative disease Mother        Shy Drager died at 76   Alcohol abuse Other    Heart disease Other     Social History Social History   Tobacco Use   Smoking status: Former    Packs/day: 0.25    Years: 7.00    Total pack years: 1.75    Types: Cigarettes    Quit date: 04/27/1963    Years since quitting: 58.7   Smokeless tobacco: Never  Substance Use Topics   Alcohol use: Yes    Alcohol/week: 7.0 standard drinks of alcohol    Types: 7 Glasses of wine per week    Comment: 1 glass of wine daily   Drug use: No    Types: Hydrocodone     Allergies   Keflex [cephalexin], Lyrica [pregabalin], Oxytetracycline, and Memantine   Review of Systems Review of Systems  Constitutional: Negative.   Respiratory: Negative.    Cardiovascular: Negative.   Genitourinary:  Positive for frequency and urgency. Negative for decreased urine volume, difficulty urinating, dyspareunia, dysuria, enuresis, flank pain, genital sores, hematuria, menstrual problem, pelvic pain, vaginal bleeding, vaginal discharge and vaginal pain.  Skin: Negative.      Physical Exam Triage Vital Signs ED Triage Vitals  Enc Vitals Group     BP 01/23/22 1341 115/67     Pulse Rate 01/23/22 1341 73     Resp 01/23/22 1341 12     Temp 01/23/22 1341 98 F (36.7 C)     Temp Source 01/23/22 1341 Oral     SpO2 01/23/22 1341 100 %     Weight 01/23/22 1339 130 lb (59 kg)     Height --      Head Circumference --      Peak Flow --      Pain Score 01/23/22 1339 4     Pain Loc --      Pain Edu? --      Excl. in [place]?  --    No data found.  Updated Vital Signs BP 115/67 (BP Location: Right Arm)   Pulse 73   Temp 98 F (36.7 C) (Oral)   Resp 12   Wt 130 lb (59 kg)   SpO2 100%   BMI 22.31 kg/m   Visual Acuity Right Eye Distance:   Left Eye Distance:   Bilateral Distance:    Right Eye Near:   Left Eye Near:    Bilateral Near:     Physical Exam Constitutional:      Appearance: Normal appearance.  Eyes:     Extraocular Movements: Extraocular movements intact.  Pulmonary:     Effort: Pulmonary effort is normal.  Abdominal:     General: Abdomen is flat. Bowel sounds are normal. There is no distension.     Palpations: Abdomen is soft.     Tenderness: There is no abdominal tenderness. There is no right CVA tenderness or left CVA tenderness.  Neurological:     Mental Status: She is alert and oriented to person, place, and time. Mental status is at baseline.  Psychiatric:        Mood and Affect: Mood normal.        Behavior: Behavior normal.      UC Treatments / Results  Labs (all labs ordered are listed, but only abnormal results are displayed) Labs Reviewed  POCT URINALYSIS DIPSTICK, ED / UC - Abnormal; Notable for the following components:      Result Value   Nitrite POSITIVE (*)    Leukocytes,Ua TRACE (*)    All other components within normal limits  URINE CULTURE    EKG   Radiology No results found.  Procedures Procedures (including critical care time)  Medications Ordered in UC Medications - No data to display  Initial Impression / Assessment and Plan / UC Course  I have reviewed the triage vital signs and the nursing notes.  Pertinent labs & imaging results that were available during my care of the patient were reviewed by me and considered in my medical decision making (see chart for details).  Acute cystitis without hematuria  Urinalysis showing leukocytes and nitrates, sent for culture, discussed findings with patient, ciprofloxacin prescribed, advised good  hygiene and increase fluid intake for additional supportive care, may also use over-the-counter Pyridium or analgesics if discomfort occurs, given strict precautions that if symptoms persist or recur to follow-up with urgent care or PCP for further evaluation Final Clinical Impressions(s) / UC Diagnoses   Final diagnoses:  None   Discharge Instructions   None    ED Prescriptions   None    PDMP not reviewed this encounter.   Hans Eden, NP 01/23/22 1406

## 2022-01-23 NOTE — Discharge Instructions (Signed)
Your urinalysis shows Susan Ford blood cells and nitrates which are indicative of infection, your urine will be sent to the lab to determine exactly which bacteria is present, if any changes need to be made to your medications you will be notified  Begin use of ciprofloxacin every morning and every evening for 3 days  You may use over-the-counter Pyridium to help minimize your symptoms until antibiotic removes bacteria, this medication will turn your urine orange  Increase your fluid intake through use of water  As always practice good hygiene, wiping front to back and avoidance of scented vaginal products to prevent further irritation  If symptoms continue to persist after use of medication or recur please follow-up with urgent care or your primary doctor as needed

## 2022-01-25 LAB — URINE CULTURE: Culture: >=100000 — AB

## 2022-01-29 DIAGNOSIS — Z23 Encounter for immunization: Secondary | ICD-10-CM | POA: Diagnosis not present

## 2022-02-01 ENCOUNTER — Ambulatory Visit: Payer: Medicare Other | Admitting: Podiatry

## 2022-02-04 ENCOUNTER — Ambulatory Visit: Payer: Medicare Other | Admitting: Podiatry

## 2022-02-08 ENCOUNTER — Telehealth: Payer: Self-pay

## 2022-02-08 NOTE — Telephone Encounter (Signed)
--  Caller states she has a cough T 99.2 oral, cough, sore throat. S/S started yesterday. Denies CP, SOB, N/ V/D.  02/07/2022 4:56:17 PM SEE PCP WITHIN 3 DAYS Schreffler, RN, Joelene Millin  Comments User: Lajean Silvius, RN Date/Time Eilene Ghazi Time): 02/07/2022 4:54:09 PM Caller dispo upgraded 2/2 member of high risk population  User: Lajean Silvius, RN Date/Time (Eastern Time): 02/07/2022 4:57:19 PM Advised caller to call [facility] in the morning and call back 24/7 with any new/worse s/s. Caller verbalized understanding  Referrals REFERRED TO PCP OFFICE  Pt has appt with Dr Sarajane Jews on 02/09/22

## 2022-02-09 ENCOUNTER — Ambulatory Visit: Payer: BLUE CROSS/BLUE SHIELD | Admitting: Family Medicine

## 2022-02-15 ENCOUNTER — Encounter: Payer: Self-pay | Admitting: Podiatry

## 2022-02-15 ENCOUNTER — Ambulatory Visit (INDEPENDENT_AMBULATORY_CARE_PROVIDER_SITE_OTHER): Payer: Medicare Other | Admitting: Podiatry

## 2022-02-15 DIAGNOSIS — B351 Tinea unguium: Secondary | ICD-10-CM | POA: Diagnosis not present

## 2022-02-15 DIAGNOSIS — M79674 Pain in right toe(s): Secondary | ICD-10-CM | POA: Diagnosis not present

## 2022-02-15 DIAGNOSIS — M79675 Pain in left toe(s): Secondary | ICD-10-CM | POA: Diagnosis not present

## 2022-02-15 NOTE — Patient Instructions (Signed)

## 2022-02-17 NOTE — Progress Notes (Signed)
Subjective:   Patient ID: Susan Ford, female   DOB: 88 y.o.   MRN: [id number]   HPI Patient presents stating that her big toenails have started to bother her more and they are not able to help her when she gets them cut.  Cannot cut herself as they are painful and they cannot get out the ingrown's on the big toes.  Patient does not smoke likes to be active   Review of Systems  All other systems reviewed and are negative.       Objective:  Physical Exam Vitals and nursing note reviewed.  Constitutional:      Appearance: She is well-developed.  Pulmonary:     Effort: Pulmonary effort is normal.  Musculoskeletal:        General: Normal range of motion.  Skin:    General: Skin is warm.  Neurological:     Mental Status: She is alert.     Vascular status mildly diminished but intact bilateral with neurological diminished and range of motion subtalar midtarsal joint adequate diminished muscle strength commensurate with age.  Patient has thick yellow nailbeds 1-5 both feet with the hallux nails lateral borders ingrown and painful when pressed with no redness or erythema drainage noted     Assessment:  Chronic mycotic nail infection 1-5 both feet painful with ingrown toenails of the hallux bilateral     Plan:  Reviewed condition we could consider permanent correction but due to advanced age I would like not to do this if possible.  Today using sterile instrumentation I debrided all nailbeds 1-5 both feet no angiogenic bleeding took out the lateral corners to relieve all pressure and patient tolerated all this well.  Patient will be seen back to recheck

## 2022-02-19 ENCOUNTER — Ambulatory Visit (INDEPENDENT_AMBULATORY_CARE_PROVIDER_SITE_OTHER): Payer: Medicare Other | Admitting: Family Medicine

## 2022-02-19 ENCOUNTER — Encounter: Payer: Self-pay | Admitting: Family Medicine

## 2022-02-19 VITALS — BP 98/60 | HR 104 | Temp 98.7°F | Wt 122.4 lb

## 2022-02-19 DIAGNOSIS — J40 Bronchitis, not specified as acute or chronic: Secondary | ICD-10-CM

## 2022-02-19 MED ORDER — AZITHROMYCIN 250 MG PO TABS: ORAL_TABLET | ORAL | 0 refills | Status: DC

## 2022-02-19 NOTE — Progress Notes (Signed)
   Subjective:    Patient ID: Susan Ford, female    DOB: May 07, 1929, 88 y.o.   MRN: [id number]  HPI Here for 5 days of a dry cough. She gets slightly SOB at times. No chest pain or fever. Drinking fluids.    Review of Systems  Constitutional: Negative.   HENT: Negative.    Eyes: Negative.   Respiratory:  Positive for cough and shortness of breath. Negative for wheezing.   Cardiovascular: Negative.   Gastrointestinal: Negative.        Objective:   Physical Exam Constitutional:      Appearance: Normal appearance. She is not ill-appearing.  HENT:     Right Ear: Tympanic membrane, ear canal and external ear normal.     Left Ear: Tympanic membrane, ear canal and external ear normal.     Mouth/Throat:     Pharynx: Oropharynx is clear.  Eyes:     Conjunctiva/sclera: Conjunctivae normal.  Pulmonary:     Effort: Pulmonary effort is normal.     Breath sounds: Normal breath sounds.  Lymphadenopathy:     Cervical: No cervical adenopathy.  Neurological:     Mental Status: She is alert.           Assessment & Plan:  Bronchitis. Treat with a Zpack.  Alysia Penna, MD

## 2022-02-22 ENCOUNTER — Ambulatory Visit: Payer: BLUE CROSS/BLUE SHIELD | Admitting: Internal Medicine

## 2022-03-01 ENCOUNTER — Telehealth: Payer: Self-pay | Admitting: Internal Medicine

## 2022-03-01 NOTE — Telephone Encounter (Signed)
Pt is calling and would like a refill on estradiol (CLIMARA - DOSED IN MG/24 HR) 0.025 mg/24hr patch   [id number] [place] [place] - [address]: [phone] Fax: [phone]

## 2022-03-03 NOTE — Telephone Encounter (Signed)
Prior authorization is needed.  Will work on completion

## 2022-03-05 NOTE — Telephone Encounter (Signed)
Prior authorization approved for Estradiol 0.025 patches. Case [id number] from 02/03/22 to 03/05/23. [facility] left approval information on VM.

## 2022-03-11 ENCOUNTER — Telehealth: Payer: Self-pay

## 2022-03-11 NOTE — Telephone Encounter (Signed)
[facility].  Estradiol patch approved  02/03/2022 to 03/05/2023.

## 2022-05-10 ENCOUNTER — Telehealth: Payer: Self-pay | Admitting: *Deleted

## 2022-05-10 NOTE — Patient Outreach (Signed)
  Care Coordination   05/10/2022 Name: Susan Ford MRN: [id number] DOB: December 27, 1929   Care Coordination Outreach Attempts:  An unsuccessful telephone outreach was attempted today to offer the patient information about available care coordination services as a benefit of their health plan.   Follow Up Plan:  Additional outreach attempts will be made to offer the patient care coordination information and services.   Encounter Outcome:  No Answer   Care Coordination Interventions:  No, not indicated    Raina Mina, RN Care Management Coordinator [place] Office [phone]

## 2022-06-01 ENCOUNTER — Encounter: Payer: Self-pay | Admitting: Internal Medicine

## 2022-06-04 ENCOUNTER — Ambulatory Visit (INDEPENDENT_AMBULATORY_CARE_PROVIDER_SITE_OTHER): Payer: Medicare Other

## 2022-06-04 VITALS — BP 122/60 | HR 65 | Temp 98.0°F | Ht 64.0 in | Wt 127.9 lb

## 2022-06-04 DIAGNOSIS — Z0000 Encounter for general adult medical examination without abnormal findings: Secondary | ICD-10-CM

## 2022-06-04 NOTE — Progress Notes (Signed)
Subjective:   Susan Ford is a 89 y.o. female who presents for Medicare Annual (Subsequent) preventive examination.  Review of Systems     Cardiac Risk Factors include: advanced age (>32mn, >>87women)     Objective:    Today's Vitals   06/04/22 1032  BP: 122/60  Pulse: 65  Temp: 98 F (36.7 C)  TempSrc: Oral  SpO2: 95%  Weight: 127 lb 14.4 oz (58 kg)  Height: 5' 4"$  (1.626 m)   Body mass index is 21.95 kg/m.     06/04/2022   10:48 AM 06/03/2021   10:54 AM 05/30/2020   10:52 AM 07/06/2019   10:42 AM 05/18/2019   11:07 AM 05/12/2018   10:43 AM 07/05/2017    5:01 PM  Advanced Directives  Does Patient Have a Medical Advance Directive? Yes Yes Yes Yes Yes Yes   Type of AParamedicof ABroken BowLiving will HSt. HelenaLiving will HClarissaLiving will Living will HZeelandLiving will HWhites[place]Living will Living will  Does patient want to make changes to medical advance directive?  No - Patient declined No - Patient declined  No - Patient declined    Copy of HCharlestonin Chart? No - copy requested No - copy requested No - copy requested  No - copy requested No - copy requested     Current Medications (verified) Outpatient Encounter Medications as of 06/04/2022  Medication Sig   acetaminophen (TYLENOL) 500 MG tablet Take 500 mg by mouth every 6 (six) hours as needed for mild pain.    azithromycin (ZITHROMAX Z-PAK) 250 MG tablet As directed   celecoxib (CELEBREX) 200 MG capsule Take 1 capsule (200 mg total) by mouth daily.   estradiol (CLIMARA - DOSED IN MG/24 HR) 0.025 mg/24hr patch PLACE 1 PATCH (0.025 MG TOTAL) ONTO THE SKIN ONCE A WEEK ON SATURDAY   gabapentin (NEURONTIN) 600 MG tablet TAKE 1 TABLET BY MOUTH FOUR TIMES A DAY   Multiple Vitamin (MULTIVITAMIN) tablet Take 1 tablet by mouth daily after lunch.   simvastatin (ZOCOR) 10 MG tablet TAKE 1 TABLET BY  MOUTH EVERYDAY AT BEDTIME   No facility-administered encounter medications on file as of 06/04/2022.    Allergies (verified) Keflex [cephalexin], Lyrica [pregabalin], Oxytetracycline, and Memantine   History: Past Medical History:  Diagnosis Date   Arthritis    Cataract 2007   bilateral cat ext    Cholecystitis    Complication of anesthesia Feb 21, 2014   atrial fib after cholecystectomy, lasted a few hours, none since   Dysrhythmia 02-21-2014   after cholecystectomy had atrial fib for a few hours, none since   GERD (gastroesophageal reflux disease)    Had endoscopy no history of Barrett's   Hx of nonmelanoma skin cancer    Unspecified hereditary and idiopathic peripheral neuropathy    Past Surgical History:  Procedure Laterality Date   ABDOMINAL HYSTERECTOMY  15 yrs ago   complete   BREAST CYST ASPIRATION     CHOLECYSTECTOMY N/A 02/21/2014   Procedure: LAPAROSCOPIC CHOLECYSTECTOMY WITH INTRAOPERATIVE CHOLANGIOGRAM;  Surgeon: JDoreen Salvage MD;  Location: [facility] OR;  Service: General;  Laterality: N/A;   ENDOSCOPIC RETROGRADE CHOLANGIOPANCREATOGRAPHY (ERCP) WITH PROPOFOL N/A 05/29/2014   Procedure: ENDOSCOPIC RETROGRADE CHOLANGIOPANCREATOGRAPHY (ERCP) WITH PROPOFOL;  Surgeon: WArta Silence MD;  Location: [facility];  Service: Endoscopy;  Laterality: N/A;   ERCP N/A 02/24/2014   Procedure: ENDOSCOPIC RETROGRADE CHOLANGIOPANCREATOGRAPHY (ERCP);  Surgeon: WArta Silence  MD;  Location: [facility];  Service: Endoscopy;  Laterality: N/A;   EYE SURGERY Bilateral 2007   both eyes cataracts with lens replacments   LUMBAR LAMINECTOMY/DECOMPRESSION MICRODISCECTOMY  02/01/2012   Procedure: LUMBAR LAMINECTOMY/DECOMPRESSION MICRODISCECTOMY 2 LEVELS;  Surgeon: Kristeen Miss, MD;  Location: [facility];  Service: Neurosurgery;  Laterality: Left;  Left Lumbar Four-Five Lumbar Five-Sacral One Laminectomies/Foraminotomies/Microscope   SPYGLASS CHOLANGIOSCOPY N/A 05/29/2014   Procedure: SPYGLASS  CHOLANGIOSCOPY;  Surgeon: Arta Silence, MD;  Location: [facility];  Service: Endoscopy;  Laterality: N/A;   TONSILLECTOMY  age 30   Family History  Problem Relation Age of Onset   Neurodegenerative disease Mother        Shy Drager died at 32   Alcohol abuse Other    Heart disease Other    Social History   Socioeconomic History   Marital status: Widowed    Spouse name: Not on file   Number of children: 3   Years of education: 15 years   Highest education level: Associate degree: occupational, Hotel manager, or vocational program  Occupational History   Occupation: Retired    Comment: Therapist, sports  Tobacco Use   Smoking status: Former    Packs/day: 0.25    Years: 7.00    Total pack years: 1.75    Types: Cigarettes    Quit date: 04/27/1963    Years since quitting: 59.1   Smokeless tobacco: Never  Substance and Sexual Activity   Alcohol use: Yes    Alcohol/week: 7.0 standard drinks of alcohol    Types: 7 Glasses of wine per week    Comment: 1 glass of wine daily   Drug use: No    Types: Hydrocodone   Sexual activity: Yes  Other Topics Concern   Not on file  Social History Narrative   Retired Therapist, sports    widowed since    Spring  15    [facility] [employer] [facility] 1   Regular exercise former smoker   G3P3   From [place] originally      05/12/2018:   Lives alone on ranch house, but has stairs to garage with "acorn chair" lift    Son in [place], and two daughters out of state, all of whom are supportive    Worked in NICU, 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 Misses working the floor.          Social Determinants of Health   Financial Resource Strain: Low Risk  (06/04/2022)   Overall Financial Resource Strain (CARDIA)    Difficulty of Paying Living Expenses: Not hard at all  Food Insecurity: No Food Insecurity (06/04/2022)   Hunger Vital Sign    Worried About Running Out of Food in the Last Year: Never true    Ran Out of Food in the Last Year: Never true  Transportation Needs: No  Transportation Needs (06/04/2022)   PRAPARE - Hydrologist (Medical): No    Lack of Transportation (Non-Medical): No  Physical Activity: Inactive (06/04/2022)   Exercise Vital Sign    Days of Exercise per Week: 0 days    Minutes of Exercise per Session: 0 min  Stress: No Stress Concern Present (06/04/2022)   [place]    Feeling of Stress : Not at all  Social Connections: Moderately Integrated (06/04/2022)   Social Connection and Isolation Panel [NHANES]    Frequency of Communication with Friends and Family: More than three times a week  Frequency of Social Gatherings with Friends and Family: More than three times a week    Attends Religious Services: More than 4 times per year    Active Member of Clubs or Organizations: Yes    Attends Archivist Meetings: More than 4 times per year    Marital Status: Widowed    Tobacco Counseling Counseling given: Not Answered   Clinical Intake:  Pre-visit preparation completed: No  Pain : No/denies pain     BMI - recorded: 21.95 Nutritional Status: BMI of 19-24  Normal Nutritional Risks: None Diabetes: No  How often do you need to have someone help you when you read instructions, pamphlets, or other written materials from your doctor or pharmacy?: 1 - Never  Diabetic?  No  Interpreter Needed?: No  Information entered by :: Rolene Arbour LPN   Activities of Daily Living    06/04/2022   10:47 AM  In your present state of health, do you have any difficulty performing the following activities:  Hearing? 1  Comment Wears hearing aids  Vision? 0  Difficulty concentrating or making decisions? 0  Walking or climbing stairs? 1  Comment Uses rollator Walker  Dressing or bathing? 0  Doing errands, shopping? 0  Preparing Food and eating ? N  Using the Toilet? N  In the past six months, have you accidently leaked urine? N  Do you have  problems with loss of bowel control? N  Managing your Medications? N  Managing your Finances? N  Housekeeping or managing your Housekeeping? N    Patient Care Team: Panosh, Standley Brooking, MD as PCP - General Josue Hector, MD as PCP - Cardiology (Cardiology) Martinique, Amy, MD as Consulting Physician (Dermatology) Marcial Pacas, MD as Consulting Physician (Neurology) Kristeen Miss, MD as Consulting Physician (Neurosurgery) Marlaine Hind, MD as Consulting Physician (Physical Medicine and Rehabilitation) Jola Baptist, [place] as Referring Physician (Chiropractic Medicine) Webb Laws, [place] as Referring Physician (Optometry) Jola Baptist, [place] as Referring Physician (Chiropractic Medicine) Suzzanne Cloud, NP as Nurse Practitioner (Neurology)  Indicate any recent Medical Services you may have received from other than [facility] providers in the past year (date may be approximate).     Assessment:   This is a routine wellness examination for [place].  Hearing/Vision screen Hearing Screening - Comments:: Wears hearing aids Vision Screening - Comments:: Wears rx glasses - up to date with routine eye exams with  Dr Einar Gip  Dietary issues and exercise activities discussed: Exercise limited by: None identified   Goals Addressed               This Visit's Progress     No Current Goals (pt-stated)         Depression Screen    06/04/2022   10:45 AM 02/19/2022    9:56 AM 09/23/2021   11:16 AM 06/03/2021   10:49 AM 06/01/2021   10:36 AM 05/05/2021    9:36 AM 05/30/2020   10:57 AM  PHQ 2/9 Scores  PHQ - 2 Score 0 0 0 0 0 3 0  PHQ- 9 Score  0 0 0 0 7     Fall Risk    06/04/2022   10:48 AM 02/19/2022    9:56 AM 09/23/2021   11:16 AM 06/03/2021   10:53 AM 06/01/2021   10:36 AM  Fall Risk   Falls in the past year? 0 0 0 0 0  Number falls in past yr: 0 0 0 0   Injury with Fall? 0  0 0 0   Risk for fall due to : No Fall Risks No Fall Risks No Fall Risks No Fall Risks   Follow up Falls prevention  discussed Falls evaluation completed Falls evaluation completed      [place]:  Any stairs in or around the home? Yes  If so, are there any without handrails? No  Home free of loose throw rugs in walkways, pet beds, electrical cords, etc? Yes  Adequate lighting in your home to reduce risk of falls? Yes   ASSISTIVE DEVICES UTILIZED TO PREVENT FALLS:  Life alert? No  Use of a cane, walker or w/c? Yes  Grab bars in the bathroom? Yes  Shower chair or bench in shower? Yes  Elevated toilet seat or a handicapped toilet? Yes   TIMED UP AND GO:  Was the test performed? Yes .  Length of time to ambulate 10 feet: 10 sec.   Gait slow and steady with assistive device  Cognitive Function:    01/16/2020   10:10 AM 11/13/2018   12:00 PM 04/12/2018   10:31 AM 10/03/2017    9:38 AM 06/29/2016   11:28 AM  MMSE - Mini Mental State Exam  Orientation to time 5 4 4 5 5  $ Orientation to Place 5 5 5 4 5  $ Registration 3 3 3 3 3  $ Attention/ Calculation 5 4 4 5 5  $ Recall 2 3 2 1 3  $ Language- name 2 objects 2 2 2 2 2  $ Language- repeat 1 1 1 1 1  $ Language- follow 3 step command 3 3 3 3 3  $ Language- read & follow direction 1 1 1 1 1  $ Write a sentence 1 1 1 1 1  $ Copy design 1 1 1 1 1  $ Total score 29 28 27 27 30      $ 01/19/2022    1:50 PM 10/21/2021   10:26 AM 01/19/2021    1:00 PM  Montreal Cognitive Assessment   Visuospatial/ Executive (0/5) 3 3 2  $ Naming (0/3) 2 2 2  $ Attention: Read list of digits (0/2) 1 2 2  $ Attention: Read list of letters (0/1) 1 0 1  Attention: Serial 7 subtraction starting at 100 (0/3) 3 3 1  $ Language: Repeat phrase (0/2) 1 2 2  $ Language : Fluency (0/1) 1 0 0  Abstraction (0/2) 2 2 2  $ Delayed Recall (0/5) 0 3 0  Orientation (0/6) 6 6 6  $ Total 20 23 18      $ 06/04/2022   10:48 AM 06/03/2021   10:54 AM 05/30/2020   10:59 AM 05/18/2019   11:10 AM  6CIT Screen  What Year? 0 points 0 points 0 points 0 points  What month? 0 points 0 points  0 points 0 points  What time? 0 points 0 points 0 points 0 points  Count back from 20 2 points 0 points 0 points 0 points  Months in reverse 4 points 2 points 0 points 0 points  Repeat phrase 4 points 4 points 6 points 2 points  Total Score 10 points 6 points 6 points 2 points    Immunizations Immunization History  Administered Date(s) Administered   Influenza Split 02/25/2012   Influenza, High Dose Seasonal PF 01/13/2016, 01/19/2017, 01/04/2018, 12/28/2019, 12/23/2020   Influenza-Unspecified 01/31/2014, 01/09/2019   PFIZER Comirnaty(Gray Top)Covid-19 Tri-Sucrose Vaccine 07/25/2020   PFIZER(Purple Top)SARS-COV-2 Vaccination 05/05/2019, 05/26/2019, 01/21/2020   Pfizer Covid-19 Vaccine Bivalent Booster 32yr & up 01/13/2021   Pneumococcal Conjugate-13 10/23/2013,  03/11/2014   Pneumococcal Polysaccharide-23 02/18/2016   Td 05/15/2010   Tdap 11/08/2011   Zoster, Live 04/09/2014    TDAP status: Due, Education has been provided regarding the importance of this vaccine. Advised may receive this vaccine at local pharmacy or Health Dept. Aware to provide a copy of the vaccination record if obtained from local pharmacy or Health Dept. Verbalized acceptance and understanding.  Flu Vaccine status: Up to date  Pneumococcal vaccine status: Up to date  Covid-19 vaccine status: Completed vaccines  Qualifies for Shingles Vaccine? Yes   Zostavax completed No   Shingrix Completed?: No.    Education has been provided regarding the importance of this vaccine. Patient has been advised to call insurance company to determine out of pocket expense if they have not yet received this vaccine. Advised may also receive vaccine at local pharmacy or Health Dept. Verbalized acceptance and understanding.  Screening Tests Health Maintenance  Topic Date Due   DTaP/Tdap/Td (3 - Td or Tdap) 11/07/2021   COVID-19 Vaccine (6 - 2023-24 season) 06/20/2022 (Originally 12/25/2021)   INFLUENZA VACCINE  07/25/2022  (Originally 11/24/2021)   Zoster Vaccines- Shingrix (1 of 2) 09/02/2022 (Originally 06/30/1948)   Medicare Annual Wellness (AWV)  06/05/2023   Pneumonia Vaccine 42+ Years old  Completed   DEXA SCAN  Completed   HPV VACCINES  Aged Out    Health Maintenance  Health Maintenance Due  Topic Date Due   DTaP/Tdap/Td (3 - Td or Tdap) 11/07/2021    Colorectal cancer screening: No longer required.   Mammogram status: No longer required due to Age.  Bone Density status: Completed 02/10/15. Results reflect: Bone density results: OSTEOPOROSIS. Repeat every   years.  Lung Cancer Screening: (Low Dose CT Chest recommended if Age 25-80 years, 30 pack-year currently smoking OR have quit w/in 15years.) does not qualify.     Additional Screening:  Hepatitis C Screening: does not qualify; Completed    Vision Screening: Recommended annual ophthalmology exams for early detection of glaucoma and other disorders of the eye. Is the patient up to date with their annual eye exam?  Yes  Who is the provider or what is the name of the office in which the patient attends annual eye exams? Dr Einar Gip If pt is not established with a provider, would they like to be referred to a provider to establish care? No .   Dental Screening: Recommended annual dental exams for proper oral hygiene  Community Resource Referral / Chronic Care Management:  CRR required this visit?  No   CCM required this visit?  No      Plan:     I have personally reviewed and noted the following in the patient's chart:   Medical and social history Use of alcohol, tobacco or illicit drugs  Current medications and supplements including opioid prescriptions. Patient is not currently taking opioid prescriptions. Functional ability and status Nutritional status Physical activity Advanced directives List of other physicians Hospitalizations, surgeries, and ER visits in previous 12 months Vitals Screenings to include cognitive,  depression, and falls Referrals and appointments  In addition, I have reviewed and discussed with patient certain preventive protocols, quality metrics, and best practice recommendations. A written personalized care plan for preventive services as well as general preventive health recommendations were provided to patient.     Criselda Peaches, LPN   [id number]   Nurse Notes: None

## 2022-06-04 NOTE — Patient Instructions (Signed)
Susan Ford , Thank you for taking time to come for your Medicare Wellness Visit. I appreciate your ongoing commitment to your health goals. Please review the following plan we discussed and let me know if I can assist you in the future.   These are the goals we discussed:  Goals      DIET - EAT MORE FRUITS AND VEGETABLES     Patient Stated     To maintain your health.      Patient Stated     Stay engaged with others;         This is a list of the screening recommended for you and due dates:  Health Maintenance  Topic Date Due   Zoster (Shingles) Vaccine (1 of 2) Never done   DTaP/Tdap/Td vaccine (3 - Td or Tdap) 11/07/2021   Flu Shot  11/24/2021   COVID-19 Vaccine (6 - 2023-24 season) 12/25/2021   Medicare Annual Wellness Visit  06/05/2023   Pneumonia Vaccine  Completed   DEXA scan (bone density measurement)  Completed   HPV Vaccine  Aged Out    Advanced directives: Please bring a copy of your health care power of attorney and living will to the office to be added to your chart at your convenience.   Conditions/risks identified: None  Next appointment: Follow up in one year for your annual wellness visit      Preventive Care 65 Years and Older, Female Preventive care refers to lifestyle choices and visits with your health care provider that can promote health and wellness. What does preventive care include? A yearly physical exam. This is also called an annual well check. Dental exams once or twice a year. Routine eye exams. Ask your health care provider how often you should have your eyes checked. Personal lifestyle choices, including: Daily care of your teeth and gums. Regular physical activity. Eating a healthy diet. Avoiding tobacco and drug use. Limiting alcohol use. Practicing safe sex. Taking low-dose aspirin every day. Taking vitamin and mineral supplements as recommended by your health care provider. What happens during an annual well check? The  services and screenings done by your health care provider during your annual well check will depend on your age, overall health, lifestyle risk factors, and family history of disease. Counseling  Your health care provider may ask you questions about your: Alcohol use. Tobacco use. Drug use. Emotional well-being. Home and relationship well-being. Sexual activity. Eating habits. History of falls. Memory and ability to understand (cognition). Work and work Statistician. Reproductive health. Screening  You may have the following tests or measurements: Height, weight, and BMI. Blood pressure. Lipid and cholesterol levels. These may be checked every 5 years, or more frequently if you are over 29 years old. Skin check. Lung cancer screening. You may have this screening every year starting at age 21 if you have a 30-pack-year history of smoking and currently smoke or have quit within the past 15 years. Fecal occult blood test (FOBT) of the stool. You may have this test every year starting at age 69. Flexible sigmoidoscopy or colonoscopy. You may have a sigmoidoscopy every 5 years or a colonoscopy every 10 years starting at age 31. Hepatitis C blood test. Hepatitis B blood test. Sexually transmitted disease (STD) testing. Diabetes screening. This is done by checking your blood sugar (glucose) after you have not eaten for a while (fasting). You may have this done every 1-3 years. Bone density scan. This is done to screen for osteoporosis. You may  have this done starting at age 58. Mammogram. This may be done every 1-2 years. Talk to your health care provider about how often you should have regular mammograms. Talk with your health care provider about your test results, treatment options, and if necessary, the need for more tests. Vaccines  Your health care provider may recommend certain vaccines, such as: Influenza vaccine. This is recommended every year. Tetanus, diphtheria, and acellular  pertussis (Tdap, Td) vaccine. You may need a Td booster every 10 years. Zoster vaccine. You may need this after age 26. Pneumococcal 13-valent conjugate (PCV13) vaccine. One dose is recommended after age 87. Pneumococcal polysaccharide (PPSV23) vaccine. One dose is recommended after age 69. Talk to your health care provider about which screenings and vaccines you need and how often you need them. This information is not intended to replace advice given to you by your health care provider. Make sure you discuss any questions you have with your health care provider. Document Released: 05/09/2015 Document Revised: 12/31/2015 Document Reviewed: 02/11/2015 Elsevier Interactive Patient Education  2017 [employer] Prevention in the Home Falls can cause injuries. They can happen to people of all ages. There are many things you can do to make your home safe and to help prevent falls. What can I do on the outside of my home? Regularly fix the edges of walkways and driveways and fix any cracks. Remove anything that might make you trip as you walk through a door, such as a raised step or threshold. Trim any bushes or trees on the path to your home. Use bright outdoor lighting. Clear any walking paths of anything that might make someone trip, such as rocks or tools. Regularly check to see if handrails are loose or broken. Make sure that both sides of any steps have handrails. Any raised decks and porches should have guardrails on the edges. Have any leaves, snow, or ice cleared regularly. Use sand or salt on walking paths during winter. Clean up any spills in your garage right away. This includes oil or grease spills. What can I do in the bathroom? Use night lights. Install grab bars by the toilet and in the tub and shower. Do not use towel bars as grab bars. Use non-skid mats or decals in the tub or shower. If you need to sit down in the shower, use a plastic, non-slip stool. Keep the floor  dry. Clean up any water that spills on the floor as soon as it happens. Remove soap buildup in the tub or shower regularly. Attach bath mats securely with double-sided non-slip rug tape. Do not have throw rugs and other things on the floor that can make you trip. What can I do in the bedroom? Use night lights. Make sure that you have a light by your bed that is easy to reach. Do not use any sheets or blankets that are too big for your bed. They should not hang down onto the floor. Have a firm chair that has side arms. You can use this for support while you get dressed. Do not have throw rugs and other things on the floor that can make you trip. What can I do in the kitchen? Clean up any spills right away. Avoid walking on wet floors. Keep items that you use a lot in easy-to-reach places. If you need to reach something above you, use a strong step stool that has a grab bar. Keep electrical cords out of the way. Do not use floor polish  or wax that makes floors slippery. If you must use wax, use non-skid floor wax. Do not have throw rugs and other things on the floor that can make you trip. What can I do with my stairs? Do not leave any items on the stairs. Make sure that there are handrails on both sides of the stairs and use them. Fix handrails that are broken or loose. Make sure that handrails are as long as the stairways. Check any carpeting to make sure that it is firmly attached to the stairs. Fix any carpet that is loose or worn. Avoid having throw rugs at the top or bottom of the stairs. If you do have throw rugs, attach them to the floor with carpet tape. Make sure that you have a light switch at the top of the stairs and the bottom of the stairs. If you do not have them, ask someone to add them for you. What else can I do to help prevent falls? Wear shoes that: Do not have high heels. Have rubber bottoms. Are comfortable and fit you well. Are closed at the toe. Do not wear  sandals. If you use a stepladder: Make sure that it is fully opened. Do not climb a closed stepladder. Make sure that both sides of the stepladder are locked into place. Ask someone to hold it for you, if possible. Clearly mark and make sure that you can see: Any grab bars or handrails. First and last steps. Where the edge of each step is. Use tools that help you move around (mobility aids) if they are needed. These include: Canes. Walkers. Scooters. Crutches. Turn on the lights when you go into a dark area. Replace any light bulbs as soon as they burn out. Set up your furniture so you have a clear path. Avoid moving your furniture around. If any of your floors are uneven, fix them. If there are any pets around you, be aware of where they are. Review your medicines with your doctor. Some medicines can make you feel dizzy. This can increase your chance of falling. Ask your doctor what other things that you can do to help prevent falls. This information is not intended to replace advice given to you by your health care provider. Make sure you discuss any questions you have with your health care provider. Document Released: 02/06/2009 Document Revised: 09/18/2015 Document Reviewed: 05/17/2014 Elsevier Interactive Patient Education  2017 [employer].

## 2022-06-19 ENCOUNTER — Other Ambulatory Visit: Payer: Self-pay | Admitting: Neurology

## 2022-06-19 ENCOUNTER — Other Ambulatory Visit: Payer: Self-pay | Admitting: Internal Medicine

## 2022-07-08 ENCOUNTER — Observation Stay (HOSPITAL_COMMUNITY)
Admission: EM | Admit: 2022-07-08 | Discharge: 2022-07-09 | Disposition: A | Discharge status: Home / Self Care | Payer: Medicare Other | Attending: Internal Medicine | Admitting: Internal Medicine

## 2022-07-08 ENCOUNTER — Emergency Department (HOSPITAL_COMMUNITY): Payer: Medicare Other

## 2022-07-08 ENCOUNTER — Encounter (HOSPITAL_COMMUNITY): Payer: Self-pay | Admitting: Emergency Medicine

## 2022-07-08 ENCOUNTER — Other Ambulatory Visit: Payer: Self-pay

## 2022-07-08 DIAGNOSIS — G629 Polyneuropathy, unspecified: Secondary | ICD-10-CM

## 2022-07-08 DIAGNOSIS — K2970 Gastritis, unspecified, without bleeding: Secondary | ICD-10-CM | POA: Diagnosis not present

## 2022-07-08 DIAGNOSIS — K922 Gastrointestinal hemorrhage, unspecified: Secondary | ICD-10-CM

## 2022-07-08 DIAGNOSIS — K222 Esophageal obstruction: Secondary | ICD-10-CM | POA: Diagnosis not present

## 2022-07-08 DIAGNOSIS — Z87891 Personal history of nicotine dependence: Secondary | ICD-10-CM | POA: Insufficient documentation

## 2022-07-08 DIAGNOSIS — F0390 Unspecified dementia without behavioral disturbance: Secondary | ICD-10-CM | POA: Diagnosis not present

## 2022-07-08 DIAGNOSIS — M1990 Unspecified osteoarthritis, unspecified site: Secondary | ICD-10-CM | POA: Diagnosis present

## 2022-07-08 DIAGNOSIS — K449 Diaphragmatic hernia without obstruction or gangrene: Secondary | ICD-10-CM | POA: Diagnosis not present

## 2022-07-08 DIAGNOSIS — K259 Gastric ulcer, unspecified as acute or chronic, without hemorrhage or perforation: Secondary | ICD-10-CM | POA: Diagnosis not present

## 2022-07-08 DIAGNOSIS — R58 Hemorrhage, not elsewhere classified: Secondary | ICD-10-CM | POA: Diagnosis not present

## 2022-07-08 DIAGNOSIS — K5730 Diverticulosis of large intestine without perforation or abscess without bleeding: Secondary | ICD-10-CM | POA: Diagnosis not present

## 2022-07-08 DIAGNOSIS — R933 Abnormal findings on diagnostic imaging of other parts of digestive tract: Secondary | ICD-10-CM | POA: Diagnosis not present

## 2022-07-08 DIAGNOSIS — K921 Melena: Secondary | ICD-10-CM | POA: Diagnosis not present

## 2022-07-08 LAB — COMPREHENSIVE METABOLIC PANEL
ALT: 14 U/L (ref 0–44)
AST: 21 U/L (ref 15–41)
Albumin: 3.8 g/dL (ref 3.5–5.0)
Alkaline Phosphatase: 58 U/L (ref 38–126)
Anion gap: 11 (ref 5–15)
BUN: 31 mg/dL — ABNORMAL HIGH (ref 8–23)
CO2: 22 mmol/L (ref 22–32)
Calcium: 8.7 mg/dL — ABNORMAL LOW (ref 8.9–10.3)
Chloride: 102 mmol/L (ref 98–111)
Creatinine, Ser: 0.82 mg/dL (ref 0.44–1.00)
GFR, Estimated: >60 mL/min (ref >60)
Glucose, Bld: 105 mg/dL — ABNORMAL HIGH (ref 70–99)
Potassium: 4.3 mmol/L (ref 3.5–5.1)
Sodium: 135 mmol/L (ref 135–145)
Total Bilirubin: 1 mg/dL (ref 0.3–1.2)
Total Protein: 6.9 g/dL (ref 6.5–8.1)

## 2022-07-08 LAB — CBC WITH DIFFERENTIAL/PLATELET
Abs Immature Granulocytes: 0.03 10*3/uL (ref 0.00–0.07)
Basophils Absolute: 0.1 10*3/uL (ref 0.0–0.1)
Basophils Relative: 1 %
Eosinophils Absolute: 0.6 10*3/uL — ABNORMAL HIGH (ref 0.0–0.5)
Eosinophils Relative: 9 %
HCT: 37.1 % (ref 36.0–46.0)
Hemoglobin: 12.2 g/dL (ref 12.0–15.0)
Immature Granulocytes: 0 %
Lymphocytes Relative: 23 %
Lymphs Abs: 1.6 10*3/uL (ref 0.7–4.0)
MCH: 31.2 pg (ref 26.0–34.0)
MCHC: 32.9 g/dL (ref 30.0–36.0)
MCV: 94.9 fL (ref 80.0–100.0)
Monocytes Absolute: 0.5 10*3/uL (ref 0.1–1.0)
Monocytes Relative: 8 %
Neutro Abs: 3.9 10*3/uL (ref 1.7–7.7)
Neutrophils Relative %: 59 %
Platelets: 271 10*3/uL (ref 150–400)
RBC: 3.91 MIL/uL (ref 3.87–5.11)
RDW: 12.1 % (ref 11.5–15.5)
WBC: 6.7 10*3/uL (ref 4.0–10.5)
nRBC: 0 % (ref 0.0–0.2)

## 2022-07-08 LAB — POC OCCULT BLOOD, ED: Fecal Occult Bld: POSITIVE — AB

## 2022-07-08 LAB — HEMOGLOBIN AND HEMATOCRIT, BLOOD
HCT: 34.7 % — ABNORMAL LOW (ref 36.0–46.0)
Hemoglobin: 11.6 g/dL — ABNORMAL LOW (ref 12.0–15.0)

## 2022-07-08 LAB — TYPE AND SCREEN
ABO/RH(D): O POS
Antibody Screen: NEGATIVE

## 2022-07-08 LAB — LIPASE, BLOOD: Lipase: 30 U/L (ref 11–51)

## 2022-07-08 MED ORDER — SIMVASTATIN 10 MG PO TABS
10.0000 mg | ORAL_TABLET | Freq: Every day | ORAL | Status: DC
  Administered 2022-07-08: 10 mg via ORAL
  Filled 2022-07-08: qty 1

## 2022-07-08 MED ORDER — GABAPENTIN 300 MG PO CAPS
600.0000 mg | ORAL_CAPSULE | Freq: Four times a day (QID) | ORAL | Status: DC
  Administered 2022-07-08 – 2022-07-09 (×2): 600 mg via ORAL
  Filled 2022-07-08 (×2): qty 2

## 2022-07-08 MED ORDER — SODIUM CHLORIDE 0.9 % IV SOLN: INTRAVENOUS | Status: DC

## 2022-07-08 MED ORDER — PANTOPRAZOLE SODIUM 40 MG IV SOLR
40.0000 mg | Freq: Two times a day (BID) | INTRAVENOUS | Status: DC
  Administered 2022-07-08 – 2022-07-09 (×2): 40 mg via INTRAVENOUS
  Filled 2022-07-08 (×2): qty 10

## 2022-07-08 MED ORDER — LIP MEDEX EX OINT
1.0000 | TOPICAL_OINTMENT | CUTANEOUS | Status: DC | PRN
  Filled 2022-07-08: qty 7

## 2022-07-08 MED ORDER — IOHEXOL 300 MG/ML  SOLN
100.0000 mL | Freq: Once | INTRAMUSCULAR | Status: AC | PRN
  Administered 2022-07-08: 100 mL via INTRAVENOUS

## 2022-07-08 MED ORDER — ACETAMINOPHEN 325 MG PO TABS
650.0000 mg | ORAL_TABLET | Freq: Four times a day (QID) | ORAL | Status: DC | PRN
  Administered 2022-07-08: 650 mg via ORAL
  Filled 2022-07-08: qty 2

## 2022-07-08 MED ORDER — POLYVINYL ALCOHOL 1.4 % OP SOLN
1.0000 [drp] | OPHTHALMIC | Status: DC | PRN
  Filled 2022-07-08: qty 15

## 2022-07-08 MED ORDER — MELATONIN 3 MG PO TABS
6.0000 mg | ORAL_TABLET | Freq: Every evening | ORAL | Status: DC | PRN
  Administered 2022-07-08: 6 mg via ORAL
  Filled 2022-07-08: qty 2

## 2022-07-08 MED ORDER — LACTATED RINGERS IV BOLUS
500.0000 mL | Freq: Once | INTRAVENOUS | Status: AC
  Administered 2022-07-08: 500 mL via INTRAVENOUS

## 2022-07-08 MED ORDER — PANTOPRAZOLE SODIUM 40 MG IV SOLR
40.0000 mg | Freq: Once | INTRAVENOUS | Status: AC
  Administered 2022-07-08: 40 mg via INTRAVENOUS
  Filled 2022-07-08: qty 10

## 2022-07-08 MED ORDER — ONDANSETRON HCL 4 MG/2ML IJ SOLN: 4.0000 mg | Freq: Four times a day (QID) | INTRAMUSCULAR | Status: DC | PRN

## 2022-07-08 MED ORDER — ONDANSETRON HCL 4 MG PO TABS: 4.0000 mg | ORAL_TABLET | Freq: Four times a day (QID) | ORAL | Status: DC | PRN

## 2022-07-08 MED ORDER — ACETAMINOPHEN 650 MG RE SUPP: 650.0000 mg | Freq: Four times a day (QID) | RECTAL | Status: DC | PRN

## 2022-07-08 NOTE — ED Triage Notes (Signed)
Pt to ER via EMS form [facility] with reports of loose liquid dark tarry stools x 4 since MN.  Pt denies abdominal pain, n/v or other c/o at this time.

## 2022-07-08 NOTE — H&P (Signed)
History and Physical  Susan Ford [id number] DOB: 13-May-1929 DOA: 07/08/2022   PCP: Burnis Medin, MD   Patient coming from: Nursing home  Chief Complaint: Dark stools  HPI: Susan Ford is a 89 y.o. female with medical history significant for, cataracts, GERD being mated to the hospital with melena.  The patient states she was in her usual state of health until early this morning, when she was having fitful sleep, she woke from sleep and realize she needed to have a bowel movement.  Went to the bathroom and had a watery dark stool.  She has never had this before.  Denies any abdominal pain, vomiting, fevers, chills, nausea.  No recent antibiotic use, or sick contacts.  ED Course: Evaluation in the emergency department reveals that the patient is very comfortable, she has normal labs not significantly changed from baseline, she is very minimally tachycardic.  Due to concern for GI bleeding, patient was kept n.p.o., CT scan was done and showed findings as below.  She was given a dose of IV Protonix.  Case was discussed with hospitalist and accepted for admission, GI consult was also placed by ER physician..  Review of Systems: Please see HPI for pertinent positives and negatives. A complete 10 system review of systems are otherwise negative.  Past Medical History:  Diagnosis Date   Arthritis    Cataract 2007   bilateral cat ext    Cholecystitis    Complication of anesthesia Feb 21, 2014   atrial fib after cholecystectomy, lasted a few hours, none since   Dysrhythmia 02-21-2014   after cholecystectomy had atrial fib for a few hours, none since   GERD (gastroesophageal reflux disease)    Had endoscopy no history of Barrett's   Hx of nonmelanoma skin cancer    Unspecified hereditary and idiopathic peripheral neuropathy    Past Surgical History:  Procedure Laterality Date   ABDOMINAL HYSTERECTOMY  15 yrs ago   complete   BREAST CYST ASPIRATION     CHOLECYSTECTOMY  N/A 02/21/2014   Procedure: LAPAROSCOPIC CHOLECYSTECTOMY WITH INTRAOPERATIVE CHOLANGIOGRAM;  Surgeon: Doreen Salvage, MD;  Location: [facility] OR;  Service: General;  Laterality: N/A;   ENDOSCOPIC RETROGRADE CHOLANGIOPANCREATOGRAPHY (ERCP) WITH PROPOFOL N/A 05/29/2014   Procedure: ENDOSCOPIC RETROGRADE CHOLANGIOPANCREATOGRAPHY (ERCP) WITH PROPOFOL;  Surgeon: Arta Silence, MD;  Location: [facility] ENDOSCOPY;  Service: Endoscopy;  Laterality: N/A;   ERCP N/A 02/24/2014   Procedure: ENDOSCOPIC RETROGRADE CHOLANGIOPANCREATOGRAPHY (ERCP);  Surgeon: Arta Silence, MD;  Location: [facility] ENDOSCOPY;  Service: Endoscopy;  Laterality: N/A;   EYE SURGERY Bilateral 2007   both eyes cataracts with lens replacments   LUMBAR LAMINECTOMY/DECOMPRESSION MICRODISCECTOMY  02/01/2012   Procedure: LUMBAR LAMINECTOMY/DECOMPRESSION MICRODISCECTOMY 2 LEVELS;  Surgeon: Kristeen Miss, MD;  Location: [facility]URO ORS;  Service: Neurosurgery;  Laterality: Left;  Left Lumbar Four-Five Lumbar Five-Sacral One Laminectomies/Foraminotomies/Microscope   SPYGLASS CHOLANGIOSCOPY N/A 05/29/2014   Procedure: SPYGLASS CHOLANGIOSCOPY;  Surgeon: Arta Silence, MD;  Location: [facility] ENDOSCOPY;  Service: Endoscopy;  Laterality: N/A;   TONSILLECTOMY  age 89    Social History:  reports that she quit smoking about 59 years ago. Her smoking use included cigarettes. She has a 1.75 pack-year smoking history. She has never used smokeless tobacco. She reports current alcohol use of about 7.0 standard drinks of alcohol per week. She reports that she does not use drugs.   Allergies  Allergen Reactions   Keflex [Cephalexin]     Dizziness   Lyrica [Pregabalin] Other (See Comments)    "High  Fever"    Oxytetracycline Other (See Comments)    "High Fever" caused by terramycin   Memantine Rash    Family History  Problem Relation Age of Onset   Neurodegenerative disease Mother        Shy Drager died at 11   Alcohol abuse Other    Heart disease Other      Prior to Admission  medications   Medication Sig Start Date End Date Taking? Authorizing Provider  acetaminophen (TYLENOL) 500 MG tablet Take 500 mg by mouth every 6 (six) hours as needed for mild pain.     [provider]  azithromycin (ZITHROMAX Z-PAK) 250 MG tablet As directed 02/19/22   Laurey Morale, MD  celecoxib (CELEBREX) 200 MG capsule Take 1 capsule (200 mg total) by mouth daily. 09/07/16   Panosh, Standley Brooking, MD  estradiol (CLIMARA - DOSED IN MG/24 HR) 0.025 mg/24hr patch PLACE 1 PATCH (0.025 MG TOTAL) ONTO THE SKIN ONCE A WEEK ON SATURDAY 01/06/22   Panosh, Standley Brooking, MD  gabapentin (NEURONTIN) 600 MG tablet TAKE 1 TABLET BY MOUTH FOUR TIMES A DAY 06/23/22   Marcial Pacas, MD  Multiple Vitamin (MULTIVITAMIN) tablet Take 1 tablet by mouth daily after lunch.    [provider]  simvastatin (ZOCOR) 10 MG tablet TAKE 1 TABLET BY MOUTH EVERYDAY AT BEDTIME 06/20/22   Dutch Quint B, FNP    Physical Exam: BP 130/80   Pulse (!) 102   Temp 98.1 F (36.7 C) (Oral)   Resp 18   Ht '5\' 4"'$  (1.626 m)   Wt 54.4 kg   SpO2 96%   BMI 20.60 kg/m   General:  Alert, oriented, calm, in no acute distress, elderly female appearing younger than her stated age sitting up on the stretcher in her pink robe Eyes: EOMI, clear conjuctivae, white sclerea Neck: supple, no masses, trachea mildline  Cardiovascular: RRR, no murmurs or rubs, no peripheral edema  Respiratory: clear to auscultation bilaterally, no wheezes, no crackles  Abdomen: soft, nontender, nondistended, normal bowel tones heard  Skin: dry, no rashes  Musculoskeletal: no joint effusions, normal range of motion  Psychiatric: appropriate affect, normal speech  Neurologic: extraocular muscles intact, clear speech, moving all extremities with intact sensorium          Labs on Admission:  Basic Metabolic Panel: Recent Labs  Lab 07/08/22 1200  NA 135  K 4.3  CL 102  CO2 22  GLUCOSE 105*  BUN 31*  CREATININE 0.82  CALCIUM 8.7*   Liver Function  Tests: Recent Labs  Lab 07/08/22 1200  AST 21  ALT 14  ALKPHOS 58  BILITOT 1.0  PROT 6.9  ALBUMIN 3.8   Recent Labs  Lab 07/08/22 1200  LIPASE 30   No results for input(s): "AMMONIA" in the last 168 hours. CBC: Recent Labs  Lab 07/08/22 1200  WBC 6.7  NEUTROABS 3.9  HGB 12.2  HCT 37.1  MCV 94.9  PLT 271   Cardiac Enzymes: No results for input(s): "CKTOTAL", "CKMB", "CKMBINDEX", "TROPONINI" in the last 168 hours.  BNP (last 3 results) No results for input(s): "BNP" in the last 8760 hours.  ProBNP (last 3 results) No results for input(s): "PROBNP" in the last 8760 hours.  CBG: No results for input(s): "GLUCAP" in the last 168 hours.  Radiological Exams on Admission: CT ABDOMEN PELVIS W CONTRAST  Result Date: 07/08/2022 CLINICAL DATA:  Loose liquid dark tarry stools x 4 since midnight, suspected diverticulitis EXAM: CT ABDOMEN AND  PELVIS WITH CONTRAST TECHNIQUE: Multidetector CT imaging of the abdomen and pelvis was performed using the standard protocol following bolus administration of intravenous contrast. RADIATION DOSE REDUCTION: This exam was performed according to the departmental dose-optimization program which includes automated exposure control, adjustment of the mA and/or kV according to patient size and/or use of iterative reconstruction technique. CONTRAST:  140m OMNIPAQUE IOHEXOL 300 MG/ML SOLN IV. No oral contrast. COMPARISON:  02/21/2014 FINDINGS: Lower chest: Bibasilar chronic interstitial lung disease changes. BILATERAL posterior diaphragmatic defects Bochdalek type containing fat. Hepatobiliary: Gallbladder surgically absent. No hepatic abnormalities. Pancreas: Normal appearance Spleen: Normal appearance Adrenals/Urinary Tract: Adrenal thickening without mass. Kidneys, ureters, and bladder normal appearance Stomach/Bowel: Descending and sigmoid diverticulosis without evidence of diverticulitis. LEFT spigelian hernia containing a nonobstructed small bowel  loop. Remaining small bowel loops unremarkable. Appendix not visualized, no pericecal inflammatory process seen. Probable small hiatal hernia. Stomach decompressed. Wall thickening of distal gastric antrum/pylorus, appears edematous, without discrete ulcer, mass, or enhancement. Vascular/Lymphatic: Atherosclerotic calcifications aorta, iliac arteries, coronary arteries, origins of visceral arteries. Aorta normal caliber without aneurysm. No adenopathy. Reproductive: Uterus surgically absent. Nonvisualization of ovaries. Other: No free air or free fluid. No definite inflammatory process seen. Musculoskeletal: Osseous demineralization. Multilevel degenerative disc and facet disease changes lumbar spine. Inferior endplate compression fracture of T11 unchanged. IMPRESSION: Distal colonic diverticulosis without evidence of diverticulitis. LEFT Spigelian hernia containing a nonobstructed small bowel loop. Probable small hiatal hernia. Wall thickening of distal gastric antrum/pylorus, appears edematous, without discrete ulcer, mass, or enhancement visualized; this could represent gastritis or peptic ulcer disease. Chronic interstitial lung disease changes at lung bases. Extensive atherosclerotic calcifications including coronary arteries. Aortic Atherosclerosis (ICD10-I70.0). Electronically Signed   By: MLavonia DanaM.D.   On: 07/08/2022 13:38    Assessment/Plan Principal Problem:   Melena -patient not on blood thinners, no obvious source of her melena found on CT scan.  She also has no localizing symptoms.  Note wall thickening of distal gastric antrum/pylorus differential includes gastritis, or peptic ulcer disease. -Observation admission -Trend hemoglobin, recheck this evening -Avoid blood thinners -Continue daily IV Protonix -Clear liquid diet for now, n.p.o. after midnight -GI consulted by ER, await their further recommendations and management  Active Problems:   Neuropathy   Dementia ([place]  SCDs:DVT  prophylaxis: Avoid pharmacologic prophylaxis due to suspected GI bleeding  Code Status: Presumed CODE STATUS discussion with the patient in the ER.  She is unsure what she wants to do, will discuss with her pastor.  In the meantime she agreed to be designated full code.  Consults called: ER physician discussed with GI.  Admission status: Observation  Time spent: 35 minutes  Tashena Ibach MNeva SeatMD [facility] Pager [phone] If 7PM-7AM, please contact night-coverage [email or web address] Password [facility] 07/08/2022, 2:57 PM

## 2022-07-08 NOTE — ED Notes (Signed)
ED TO INPATIENT HANDOFF REPORT  ED Nurse Name and Phone #: Anderson Malta [phone]  S Name/Age/Gender Susan Ford 89 y.o. female Room/Bed: WA05/WA05  Code Status   Code Status: Full Code  Home/SNF/Other Home Patient oriented to: self, place, time, and situation Is this baseline? Yes   Triage Complete: Triage complete  Chief Complaint Melena [K92.1]  Triage Note Pt to ER via EMS form [facility] with reports of loose liquid dark tarry stools x 4 since MN.  Pt denies abdominal pain, n/v or other c/o at this time.   Allergies Allergies  Allergen Reactions   Keflex [Cephalexin]     Dizziness   Lyrica [Pregabalin] Other (See Comments)    "High Fever"    Oxytetracycline Other (See Comments)    "High Fever" caused by terramycin   Memantine Rash    Level of Care/Admitting Diagnosis ED Disposition     ED Disposition  Admit   Condition  --   [place]: [place] [[id number]]  Level of Care: Med-Surg [16]  May place patient in observation at [facility] or [place] if equivalent level of care is available:: Yes  Covid Evaluation: Asymptomatic - no recent exposure (last 10 days) testing not required  Diagnosis: Melena [[id number]]  Admitting Physician: Lucillie Garfinkel [id number]  Attending Physician: Hollice Gong, MIR M [[id number]]          B Medical/Surgery History Past Medical History:  Diagnosis Date   Arthritis    Cataract 2007   bilateral cat ext    Cholecystitis    Complication of anesthesia Feb 21, 2014   atrial fib after cholecystectomy, lasted a few hours, none since   Dysrhythmia 02-21-2014   after cholecystectomy had atrial fib for a few hours, none since   GERD (gastroesophageal reflux disease)    Had endoscopy no history of Barrett's   Hx of nonmelanoma skin cancer    Unspecified hereditary and idiopathic peripheral neuropathy    Past Surgical History:  Procedure Laterality Date   ABDOMINAL  HYSTERECTOMY  15 yrs ago   complete   BREAST CYST ASPIRATION     CHOLECYSTECTOMY N/A 02/21/2014   Procedure: LAPAROSCOPIC CHOLECYSTECTOMY WITH INTRAOPERATIVE CHOLANGIOGRAM;  Surgeon: Doreen Salvage, MD;  Location: [place];  Service: General;  Laterality: N/A;   ENDOSCOPIC RETROGRADE CHOLANGIOPANCREATOGRAPHY (ERCP) WITH PROPOFOL N/A 05/29/2014   Procedure: ENDOSCOPIC RETROGRADE CHOLANGIOPANCREATOGRAPHY (ERCP) WITH PROPOFOL;  Surgeon: Arta Silence, MD;  Location: [facility];  Service: Endoscopy;  Laterality: N/A;   ERCP N/A 02/24/2014   Procedure: ENDOSCOPIC RETROGRADE CHOLANGIOPANCREATOGRAPHY (ERCP);  Surgeon: Arta Silence, MD;  Location: [facility] ENDOSCOPY;  Service: Endoscopy;  Laterality: N/A;   EYE SURGERY Bilateral 2007   both eyes cataracts with lens replacments   LUMBAR LAMINECTOMY/DECOMPRESSION MICRODISCECTOMY  02/01/2012   Procedure: LUMBAR LAMINECTOMY/DECOMPRESSION MICRODISCECTOMY 2 LEVELS;  Surgeon: Kristeen Miss, MD;  Location: [place] [facility];  Service: Neurosurgery;  Laterality: Left;  Left Lumbar Four-Five Lumbar Five-Sacral One Laminectomies/Foraminotomies/Microscope   SPYGLASS CHOLANGIOSCOPY N/A 05/29/2014   Procedure: SPYGLASS CHOLANGIOSCOPY;  Surgeon: Arta Silence, MD;  Location: [facility];  Service: Endoscopy;  Laterality: N/A;   TONSILLECTOMY  age 53     A IV Location/Drains/Wounds Patient Lines/Drains/Airways Status     Active Line/Drains/Airways     Name Placement date Placement time Site Days   Peripheral IV 07/08/22 20 G Anterior;Left Forearm 07/08/22  1206  Forearm  less than 1   Incision 02/01/12 Back Other (Comment) 02/01/12  1110  -- 3810  Incision (Closed) 02/21/14 Abdomen Other (Comment) 02/21/14  1052  -- 3059   Incision - 4 Ports Abdomen 1: Right;Lower 2: Right;Mid 3: Umbilicus 4: Mid;Upper [id number]  --  -- 3059            Intake/Output Last 24 hours No intake or output data in the 24 hours ending 07/08/22 1522  Labs/Imaging Results for orders placed or  performed during the hospital encounter of 07/08/22 (from the past 48 hour(s))  Comprehensive metabolic panel     Status: Abnormal   Collection Time: 07/08/22 12:00 PM  Result Value Ref Range   Sodium 135 135 - 145 mmol/L   Potassium 4.3 3.5 - 5.1 mmol/L   Chloride 102 98 - 111 mmol/L   CO2 22 22 - 32 mmol/L   Glucose, Bld 105 (H) 70 - 99 mg/dL    Comment: Glucose reference range applies only to samples taken after fasting for at least 8 hours.   BUN 31 (H) 8 - 23 mg/dL   Creatinine, Ser 0.82 0.44 - 1.00 mg/dL   Calcium 8.7 (L) 8.9 - 10.3 mg/dL   Total Protein 6.9 6.5 - 8.1 g/dL   Albumin 3.8 3.5 - 5.0 g/dL   AST 21 15 - 41 U/L   ALT 14 0 - 44 U/L   Alkaline Phosphatase 58 38 - 126 U/L   Total Bilirubin 1.0 0.3 - 1.2 mg/dL   GFR, Estimated >60 >60 mL/min    Comment: (NOTE) Calculated using the CKD-EPI Creatinine Equation (2021)    Anion gap 11 5 - 15    Comment: Performed at [facility], [place] [address] [place], [place] [address]  CBC with Differential     Status: Abnormal   Collection Time: 07/08/22 12:00 PM  Result Value Ref Range   WBC 6.7 4.0 - 10.5 K/uL   RBC 3.91 3.87 - 5.11 MIL/uL   Hemoglobin 12.2 12.0 - 15.0 g/dL   HCT 37.1 36.0 - 46.0 %   MCV 94.9 80.0 - 100.0 fL   MCH 31.2 26.0 - 34.0 pg   MCHC 32.9 30.0 - 36.0 g/dL   RDW 12.1 11.5 - 15.5 %   Platelets 271 150 - 400 K/uL   nRBC 0.0 0.0 - 0.2 %   Neutrophils Relative % 59 %   Neutro Abs 3.9 1.7 - 7.7 K/uL   Lymphocytes Relative 23 %   Lymphs Abs 1.6 0.7 - 4.0 K/uL   Monocytes Relative 8 %   Monocytes Absolute 0.5 0.1 - 1.0 K/uL   Eosinophils Relative 9 %   Eosinophils Absolute 0.6 (H) 0.0 - 0.5 K/uL   Basophils Relative 1 %   Basophils Absolute 0.1 0.0 - 0.1 K/uL   Immature Granulocytes 0 %   Abs Immature Granulocytes 0.03 0.00 - 0.07 K/uL    Comment: Performed at [facility], [place] [address] [place], [place] [address]  Lipase, blood     Status: None   Collection  Time: 07/08/22 12:00 PM  Result Value Ref Range   Lipase 30 11 - 51 U/L    Comment: Performed at [facility], [place] [address] [place], [place] [address]  Type and screen [place]     Status: None   Collection Time: 07/08/22 12:00 PM  Result Value Ref Range   ABO/RH(D) O POS    Antibody Screen NEG    Sample Expiration      07/11/2022,2359 Performed at [facility], [address] [place], [place] [address]   POC occult blood, ED Provider will collect     Status: Abnormal   Collection Time: 07/08/22 12:02 PM  Result Value Ref Range   Fecal Occult Bld POSITIVE (A) NEGATIVE   CT ABDOMEN PELVIS W CONTRAST  Result Date: 07/08/2022 CLINICAL DATA:  Loose liquid dark tarry stools x 4 since midnight, suspected diverticulitis EXAM: CT ABDOMEN AND PELVIS WITH CONTRAST TECHNIQUE: Multidetector CT imaging of the abdomen and pelvis was performed using the standard protocol following bolus administration of intravenous contrast. RADIATION DOSE REDUCTION: This exam was performed according to the departmental dose-optimization program which includes automated exposure control, adjustment of the mA and/or kV according to patient size and/or use of iterative reconstruction technique. CONTRAST:  166m OMNIPAQUE IOHEXOL 300 MG/ML SOLN IV. No oral contrast. COMPARISON:  02/21/2014 FINDINGS: Lower chest: Bibasilar chronic interstitial lung disease changes. BILATERAL posterior diaphragmatic defects Bochdalek type containing fat. Hepatobiliary: Gallbladder surgically absent. No hepatic abnormalities. Pancreas: Normal appearance Spleen: Normal appearance Adrenals/Urinary Tract: Adrenal thickening without mass. Kidneys, ureters, and bladder normal appearance Stomach/Bowel: Descending and sigmoid diverticulosis without evidence of diverticulitis. LEFT spigelian hernia containing a nonobstructed small bowel loop. Remaining small bowel loops unremarkable. Appendix  not visualized, no pericecal inflammatory process seen. Probable small hiatal hernia. Stomach decompressed. Wall thickening of distal gastric antrum/pylorus, appears edematous, without discrete ulcer, mass, or enhancement. Vascular/Lymphatic: Atherosclerotic calcifications aorta, iliac arteries, coronary arteries, origins of visceral arteries. Aorta normal caliber without aneurysm. No adenopathy. Reproductive: Uterus surgically absent. Nonvisualization of ovaries. Other: No free air or free fluid. No definite inflammatory process seen. Musculoskeletal: Osseous demineralization. Multilevel degenerative disc and facet disease changes lumbar spine. Inferior endplate compression fracture of T11 unchanged. IMPRESSION: Distal colonic diverticulosis without evidence of diverticulitis. LEFT Spigelian hernia containing a nonobstructed small bowel loop. Probable small hiatal hernia. Wall thickening of distal gastric antrum/pylorus, appears edematous, without discrete ulcer, mass, or enhancement visualized; this could represent gastritis or peptic ulcer disease. Chronic interstitial lung disease changes at lung bases. Extensive atherosclerotic calcifications including coronary arteries. Aortic Atherosclerosis (ICD10-I70.0). Electronically Signed   By: MLavonia DanaM.D.   On: 07/08/2022 13:38    Pending Labs Unresulted Labs (From admission, onward)     Start     Ordered   07/09/22 0500  CBC  Tomorrow morning,   R        07/08/22 1457   07/09/22 [id number] Basic metabolic panel  Tomorrow morning,   R        07/08/22 1457   07/08/22 2100  Hemoglobin and hematocrit, blood  Once,   R        07/08/22 1457            Vitals/Pain Today's Vitals   07/08/22 1120 07/08/22 1121  BP: 130/80   Pulse: (!) 102   Resp: 18   Temp: 98.1 F (36.7 C)   TempSrc: Oral   SpO2: 96%   Weight:  54.4 kg  Height:  '5\' 4"'$  (1.626 m)  PainSc:  0-No pain    Isolation Precautions No active isolations  Medications Medications   simvastatin (ZOCOR) tablet 10 mg (has no administration in time range)  gabapentin (NEURONTIN) capsule 600 mg (has no administration in time range)  acetaminophen (TYLENOL) tablet 650 mg (has no administration in time range)    Or  acetaminophen (TYLENOL) suppository 650 mg (has no administration in time range)  ondansetron (ZOFRAN) tablet 4 mg (has no administration in time range)  Or  ondansetron (ZOFRAN) injection 4 mg (has no administration in time range)  lactated ringers bolus 500 mL (500 mLs Intravenous New Bag/Given 07/08/22 1342)  pantoprazole (PROTONIX) injection 40 mg (40 mg Intravenous Given 07/08/22 1341)  iohexol (OMNIPAQUE) 300 MG/ML solution 100 mL (100 mLs Intravenous Contrast Given 07/08/22 1309)    Mobility walks with person assist     Focused Assessments Cardiac Assessment Handoff:    No results found for: "CKTOTAL", "CKMB", "CKMBINDEX", "TROPONINI" No results found for: "DDIMER" Does the Patient currently have chest pain? No    R Recommendations: See Admitting Provider Note  Report given to:   Additional Notes:

## 2022-07-08 NOTE — ED Notes (Addendum)
Pt was able to ambulate to the restroom with assistance  

## 2022-07-08 NOTE — ED Provider Notes (Signed)
[facility] Provider Note   CSN: [id number] Arrival date & time: 07/08/22  1105     History  Chief Complaint  Patient presents with   Diarrhea    Susan Ford is a 89 y.o. female.  HPI 89 year old female presents with concern for blood in the stool.  She did not quite feel well when she went to bed last night and around 1 AM started having black diarrhea.  She has had about 4 total episodes, all of them being black and very loose.  She denies any abdominal pain.  She has not had vomiting, fever, recent antibiotic use or recent sick contacts.  She does feel little dizzy/lightheaded today.  She is not on blood thinners. No urinary symptoms.  Home Medications Prior to Admission medications   Medication Sig Start Date End Date Taking? Authorizing Provider  acetaminophen (TYLENOL) 500 MG tablet Take 500 mg by mouth every 6 (six) hours as needed for mild pain.     [provider]  azithromycin (ZITHROMAX Z-PAK) 250 MG tablet As directed 02/19/22   Laurey Morale, MD  celecoxib (CELEBREX) 200 MG capsule Take 1 capsule (200 mg total) by mouth daily. 09/07/16   Panosh, Standley Brooking, MD  estradiol (CLIMARA - DOSED IN MG/24 HR) 0.025 mg/24hr patch PLACE 1 PATCH (0.025 MG TOTAL) ONTO THE SKIN ONCE A WEEK ON SATURDAY 01/06/22   Panosh, Standley Brooking, MD  gabapentin (NEURONTIN) 600 MG tablet TAKE 1 TABLET BY MOUTH FOUR TIMES A DAY 06/23/22   Marcial Pacas, MD  Multiple Vitamin (MULTIVITAMIN) tablet Take 1 tablet by mouth daily after lunch.    [provider]  simvastatin (ZOCOR) 10 MG tablet TAKE 1 TABLET BY MOUTH EVERYDAY AT BEDTIME 06/20/22   Dutch Quint B, FNP      Allergies    Keflex [cephalexin], Lyrica [pregabalin], Oxytetracycline, and Memantine    Review of Systems   Review of Systems  Constitutional:  Negative for fever.  Gastrointestinal:  Positive for diarrhea. Negative for abdominal pain and vomiting.  Genitourinary:   Negative for dysuria.  Neurological:  Positive for light-headedness.    Physical Exam Updated Vital Signs BP 130/80   Pulse (!) 102   Temp 98.1 F (36.7 C) (Oral)   Resp 18   Ht '5\' 4"'$  (1.626 m)   Wt 54.4 kg   SpO2 96%   BMI 20.60 kg/m  Physical Exam Vitals and nursing note reviewed. Exam conducted with a chaperone present.  Constitutional:      General: She is not in acute distress.    Appearance: She is well-developed. She is not ill-appearing or diaphoretic.  HENT:     Head: Normocephalic and atraumatic.  Cardiovascular:     Rate and Rhythm: Regular rhythm. Tachycardia present.     Heart sounds: Normal heart sounds.     Comments: HR low 100s Pulmonary:     Effort: Pulmonary effort is normal.     Breath sounds: Normal breath sounds.  Abdominal:     Palpations: Abdomen is soft.     Tenderness: There is abdominal tenderness in the suprapubic area.  Genitourinary:    Comments: Dark stool on DRE. No gross blood Skin:    General: Skin is warm and dry.  Neurological:     Mental Status: She is alert.     ED Results / Procedures / Treatments   Labs (all labs ordered are listed, but only abnormal results are displayed) Labs Reviewed  COMPREHENSIVE METABOLIC PANEL - Abnormal; Notable for the following components:      Result Value   Glucose, Bld 105 (*)    BUN 31 (*)    Calcium 8.7 (*)    All other components within normal limits  CBC WITH DIFFERENTIAL/PLATELET - Abnormal; Notable for the following components:   Eosinophils Absolute 0.6 (*)    All other components within normal limits  POC OCCULT BLOOD, ED - Abnormal; Notable for the following components:   Fecal Occult Bld POSITIVE (*)    All other components within normal limits  LIPASE, BLOOD  HEMOGLOBIN AND HEMATOCRIT, BLOOD  TYPE AND SCREEN    EKG None  Radiology CT ABDOMEN PELVIS W CONTRAST  Result Date: 07/08/2022 CLINICAL DATA:  Loose liquid dark tarry stools x 4 since midnight, suspected  diverticulitis EXAM: CT ABDOMEN AND PELVIS WITH CONTRAST TECHNIQUE: Multidetector CT imaging of the abdomen and pelvis was performed using the standard protocol following bolus administration of intravenous contrast. RADIATION DOSE REDUCTION: This exam was performed according to the departmental dose-optimization program which includes automated exposure control, adjustment of the mA and/or kV according to patient size and/or use of iterative reconstruction technique. CONTRAST:  136m OMNIPAQUE IOHEXOL 300 MG/ML SOLN IV. No oral contrast. COMPARISON:  02/21/2014 FINDINGS: Lower chest: Bibasilar chronic interstitial lung disease changes. BILATERAL posterior diaphragmatic defects Bochdalek type containing fat. Hepatobiliary: Gallbladder surgically absent. No hepatic abnormalities. Pancreas: Normal appearance Spleen: Normal appearance Adrenals/Urinary Tract: Adrenal thickening without mass. Kidneys, ureters, and bladder normal appearance Stomach/Bowel: Descending and sigmoid diverticulosis without evidence of diverticulitis. LEFT spigelian hernia containing a nonobstructed small bowel loop. Remaining small bowel loops unremarkable. Appendix not visualized, no pericecal inflammatory process seen. Probable small hiatal hernia. Stomach decompressed. Wall thickening of distal gastric antrum/pylorus, appears edematous, without discrete ulcer, mass, or enhancement. Vascular/Lymphatic: Atherosclerotic calcifications aorta, iliac arteries, coronary arteries, origins of visceral arteries. Aorta normal caliber without aneurysm. No adenopathy. Reproductive: Uterus surgically absent. Nonvisualization of ovaries. Other: No free air or free fluid. No definite inflammatory process seen. Musculoskeletal: Osseous demineralization. Multilevel degenerative disc and facet disease changes lumbar spine. Inferior endplate compression fracture of T11 unchanged. IMPRESSION: Distal colonic diverticulosis without evidence of diverticulitis. LEFT  Spigelian hernia containing a nonobstructed small bowel loop. Probable small hiatal hernia. Wall thickening of distal gastric antrum/pylorus, appears edematous, without discrete ulcer, mass, or enhancement visualized; this could represent gastritis or peptic ulcer disease. Chronic interstitial lung disease changes at lung bases. Extensive atherosclerotic calcifications including coronary arteries. Aortic Atherosclerosis (ICD10-I70.0). Electronically Signed   By: MLavonia DanaM.D.   On: 07/08/2022 13:38    Procedures Procedures    Medications Ordered in ED Medications  simvastatin (ZOCOR) tablet 10 mg (has no administration in time range)  gabapentin (NEURONTIN) capsule 600 mg (has no administration in time range)  acetaminophen (TYLENOL) tablet 650 mg (has no administration in time range)    Or  acetaminophen (TYLENOL) suppository 650 mg (has no administration in time range)  ondansetron (ZOFRAN) tablet 4 mg (has no administration in time range)    Or  ondansetron (ZOFRAN) injection 4 mg (has no administration in time range)  lactated ringers bolus 500 mL (500 mLs Intravenous New Bag/Given 07/08/22 1342)  pantoprazole (PROTONIX) injection 40 mg (40 mg Intravenous Given 07/08/22 1341)  iohexol (OMNIPAQUE) 300 MG/ML solution 100 mL (100 mLs Intravenous Contrast Given 07/08/22 1309)    ED Course/ Medical Decision Making/ A&P  Medical Decision Making Amount and/or Complexity of Data Reviewed Labs: ordered.    Details: Normal hemoglobin.  BUN is mildly elevated concerning for upper GI bleed Radiology: ordered and independent interpretation performed.    Details: No free air or bowel obstruction.  Some edema at the stomach.  Risk Prescription drug management. Decision regarding hospitalization.   Patient presents with concern for GI bleed.  Her Hemoccult was positive.  She does have a elevated BUN concerning for possible upper GI bleed.  CT also seems to show  some concern for gastritis versus PUD.  She is overall well-appearing though mildly tachycardic.  She was given a small fluid bolus.  I do not think she needs blood but at the same time she is still having some of these dark stools while in the ED and I think is reasonable to admit for observation and continued monitoring.  Discussed with Dr. Renaee Munda. Also discussed with GI for consultation, Dr. Luan Pulling. She was given IV protonix.        Final Clinical Impression(s) / ED Diagnoses Final diagnoses:  Upper GI bleed    Rx / DC Orders ED Discharge Orders     None         Sherwood Gambler, MD 07/08/22 1549

## 2022-07-09 ENCOUNTER — Encounter (HOSPITAL_COMMUNITY): Payer: Self-pay | Admitting: Gastroenterology

## 2022-07-09 ENCOUNTER — Observation Stay (HOSPITAL_COMMUNITY): Payer: Medicare Other | Admitting: Anesthesiology

## 2022-07-09 ENCOUNTER — Encounter (HOSPITAL_COMMUNITY)
Admission: EM | Disposition: A | Discharge status: Home / Self Care | Payer: Self-pay | Source: Home / Self Care | Attending: Emergency Medicine

## 2022-07-09 ENCOUNTER — Observation Stay (HOSPITAL_BASED_OUTPATIENT_CLINIC_OR_DEPARTMENT_OTHER): Payer: Medicare Other | Admitting: Anesthesiology

## 2022-07-09 DIAGNOSIS — K254 Chronic or unspecified gastric ulcer with hemorrhage: Secondary | ICD-10-CM | POA: Diagnosis not present

## 2022-07-09 DIAGNOSIS — K2971 Gastritis, unspecified, with bleeding: Secondary | ICD-10-CM | POA: Diagnosis not present

## 2022-07-09 DIAGNOSIS — D649 Anemia, unspecified: Secondary | ICD-10-CM | POA: Diagnosis not present

## 2022-07-09 DIAGNOSIS — K449 Diaphragmatic hernia without obstruction or gangrene: Secondary | ICD-10-CM

## 2022-07-09 DIAGNOSIS — K259 Gastric ulcer, unspecified as acute or chronic, without hemorrhage or perforation: Secondary | ICD-10-CM | POA: Diagnosis not present

## 2022-07-09 DIAGNOSIS — M1990 Unspecified osteoarthritis, unspecified site: Secondary | ICD-10-CM | POA: Diagnosis not present

## 2022-07-09 DIAGNOSIS — R935 Abnormal findings on diagnostic imaging of other abdominal regions, including retroperitoneum: Secondary | ICD-10-CM | POA: Diagnosis not present

## 2022-07-09 DIAGNOSIS — K222 Esophageal obstruction: Secondary | ICD-10-CM | POA: Diagnosis not present

## 2022-07-09 DIAGNOSIS — K921 Melena: Secondary | ICD-10-CM | POA: Diagnosis not present

## 2022-07-09 DIAGNOSIS — K2970 Gastritis, unspecified, without bleeding: Secondary | ICD-10-CM | POA: Diagnosis not present

## 2022-07-09 DIAGNOSIS — Z87891 Personal history of nicotine dependence: Secondary | ICD-10-CM | POA: Diagnosis not present

## 2022-07-09 HISTORY — PX: ESOPHAGOGASTRODUODENOSCOPY (EGD) WITH PROPOFOL: SHX5813

## 2022-07-09 HISTORY — PX: BIOPSY: SHX5522

## 2022-07-09 LAB — BASIC METABOLIC PANEL
Anion gap: 7 (ref 5–15)
BUN: 20 mg/dL (ref 8–23)
CO2: 24 mmol/L (ref 22–32)
Calcium: 8.5 mg/dL — ABNORMAL LOW (ref 8.9–10.3)
Chloride: 102 mmol/L (ref 98–111)
Creatinine, Ser: 0.71 mg/dL (ref 0.44–1.00)
GFR, Estimated: >60 mL/min (ref >60)
Glucose, Bld: 97 mg/dL (ref 70–99)
Potassium: 4 mmol/L (ref 3.5–5.1)
Sodium: 133 mmol/L — ABNORMAL LOW (ref 135–145)

## 2022-07-09 LAB — CBC
HCT: 32.4 % — ABNORMAL LOW (ref 36.0–46.0)
Hemoglobin: 10.8 g/dL — ABNORMAL LOW (ref 12.0–15.0)
MCH: 32 pg (ref 26.0–34.0)
MCHC: 33.3 g/dL (ref 30.0–36.0)
MCV: 95.9 fL (ref 80.0–100.0)
Platelets: 239 10*3/uL (ref 150–400)
RBC: 3.38 MIL/uL — ABNORMAL LOW (ref 3.87–5.11)
RDW: 12.2 % (ref 11.5–15.5)
WBC: 7.7 10*3/uL (ref 4.0–10.5)
nRBC: 0 % (ref 0.0–0.2)

## 2022-07-09 LAB — PROTIME-INR
INR: 0.9 (ref 0.8–1.2)
Prothrombin Time: 12.3 seconds (ref 11.4–15.2)

## 2022-07-09 LAB — ABO/RH: ABO/RH(D): O POS

## 2022-07-09 SURGERY — ESOPHAGOGASTRODUODENOSCOPY (EGD) WITH PROPOFOL
Anesthesia: Monitor Anesthesia Care

## 2022-07-09 MED ORDER — LACTATED RINGERS IV SOLN
INTRAVENOUS | Status: AC | PRN
  Administered 2022-07-09: 1000 mL via INTRAVENOUS

## 2022-07-09 MED ORDER — LIDOCAINE 2% (20 MG/ML) 5 ML SYRINGE
INTRAMUSCULAR | Status: DC | PRN
  Administered 2022-07-09: 30 mg via INTRAVENOUS

## 2022-07-09 MED ORDER — PROPOFOL 10 MG/ML IV BOLUS
INTRAVENOUS | Status: DC | PRN
  Administered 2022-07-09: 10 mg via INTRAVENOUS
  Administered 2022-07-09 (×2): 20 mg via INTRAVENOUS
  Administered 2022-07-09: 10 mg via INTRAVENOUS
  Administered 2022-07-09: 20 mg via INTRAVENOUS

## 2022-07-09 MED ORDER — PANTOPRAZOLE SODIUM 40 MG PO TBEC: DELAYED_RELEASE_TABLET | ORAL | 2 refills | Status: DC

## 2022-07-09 SURGICAL SUPPLY — 15 items

## 2022-07-09 NOTE — Assessment & Plan Note (Signed)
Continue gabapentin.

## 2022-07-09 NOTE — Transfer of Care (Signed)
Immediate Anesthesia Transfer of Care Note  Patient: Susan Ford  Procedure(s) Performed: ESOPHAGOGASTRODUODENOSCOPY (EGD) WITH PROPOFOL BIOPSY  Patient Location: PACU  Anesthesia Type:MAC  Level of Consciousness: drowsy and patient cooperative  Airway & Oxygen Therapy: Patient Spontanous Breathing and Patient connected to face mask oxygen  Post-op Assessment: Report given to RN and Post -op Vital signs reviewed and stable  Post vital signs: Reviewed and stable  Last Vitals:  Vitals Value Taken Time  BP    Temp    Pulse 83 07/09/22 1156  Resp 21 07/09/22 1156  SpO2 100 % 07/09/22 1156  Vitals shown include unvalidated device data.  Last Pain:  Vitals:   07/09/22 1106  TempSrc: Tympanic  PainSc: 0-No pain         Complications: No notable events documented.

## 2022-07-09 NOTE — Brief Op Note (Signed)
07/08/2022 - 07/09/2022  11:52 AM  PATIENT:  Susan Ford  89 y.o. female  PRE-OPERATIVE DIAGNOSIS:  Melena, abnormal CT scan  POST-OPERATIVE DIAGNOSIS:  gastric ulcer bx  PROCEDURE:  Procedure(s): ESOPHAGOGASTRODUODENOSCOPY (EGD) WITH PROPOFOL (N/A) BIOPSY  SURGEON:  Surgeon(s) and Role:    * Jonnie Kubly, MD - Primary   Findings ---------- -EGD showed large prepyloric ulcer with pigmented material without any active bleeding.  Biopsies performed.  Also showed gastritis, hiatal hernia and lower esophageal ring.  Recommendations -------------------------- -Recommend Protonix 40 mg twice a day for at least 8 weeks followed by Protonix 40 mg once a day until repeat EGD is done to document healing of gastric ulcer. -repeat EGD in 2 to 3 months -Avoid NSAIDs. -No further inpatient GI workup planned.  GI will sign off.  Call us back if needed.  Follow-up in GI clinic in 2 months.  Otis Brace MD, [place] 07/09/2022, 11:54 AM  Contact #  [phone]

## 2022-07-09 NOTE — Assessment & Plan Note (Signed)
-   s/p EGD on 3/15: "EGD showed large prepyloric ulcer with pigmented material without any active bleeding. Biopsies performed. Also showed gastritis, hiatal hernia and lower esophageal ring. " - continue protonix 40 mg BID x 8 weeks followed by protonix 40 mg daily - repeat EGD with GI in 2-3 months

## 2022-07-09 NOTE — Anesthesia Preprocedure Evaluation (Addendum)
Anesthesia Evaluation  Patient identified by MRN, date of birth, ID band Patient awake    Reviewed: Allergy & Precautions, H&P , NPO status , Patient's Chart, lab work & pertinent test results  Airway Mallampati: II  TM Distance: >3 FB Neck ROM: Full    Dental no notable dental hx.    Pulmonary former smoker   Pulmonary exam normal breath sounds clear to auscultation       Cardiovascular  Rhythm:Regular Rate:Tachycardia  1. Left ventricular ejection fraction, by estimation, is 60 to 65%. The  left ventricle has normal function. The left ventricle has no regional  wall motion abnormalities. There is mild left ventricular hypertrophy of  the basal-septal segment. Left  ventricular diastolic parameters are consistent with Grade I diastolic  dysfunction (impaired relaxation). The average left ventricular global  longitudinal strain is -18.5 %. The global longitudinal strain is normal.   2. Right ventricular systolic function is normal. The right ventricular  size is normal. There is normal pulmonary artery systolic pressure.   3. The mitral valve is normal in structure. Trivial mitral valve  regurgitation. No evidence of mitral stenosis.   4. The aortic valve is tricuspid. Aortic valve regurgitation is not  visualized. No aortic stenosis is present.     Neuro/Psych negative neurological ROS  negative psych ROS   GI/Hepatic Neg liver ROS,GERD  ,,  Endo/Other  negative endocrine ROS    Renal/GU negative Renal ROS  negative genitourinary   Musculoskeletal negative musculoskeletal ROS (+)    Abdominal   Peds negative pediatric ROS (+)  Hematology  (+) Blood dyscrasia, anemia   Anesthesia Other Findings   Reproductive/Obstetrics negative OB ROS                             Anesthesia Physical Anesthesia Plan  ASA: 3  Anesthesia Plan: MAC   Post-op Pain Management: Minimal or no pain  anticipated   Induction: Intravenous  PONV Risk Score and Plan: 2 and Propofol infusion and Treatment may vary due to age or medical condition  Airway Management Planned: Simple Face Mask  Additional Equipment:   Intra-op Plan:   Post-operative Plan:   Informed Consent: I have reviewed the patients History and Physical, chart, labs and discussed the procedure including the risks, benefits and alternatives for the proposed anesthesia with the patient or authorized representative who has indicated his/her understanding and acceptance.     Dental advisory given  Plan Discussed with: CRNA and Surgeon  Anesthesia Plan Comments:        Anesthesia Quick Evaluation

## 2022-07-09 NOTE — Assessment & Plan Note (Signed)
-   No home medications noted.

## 2022-07-09 NOTE — Hospital Course (Signed)
Susan Ford is a 89 yo female with PMH GERD, neuropathy, arthritis who presented with dark stools. She denied any history of similar.  Also denied any fevers, chills, abdominal pain, nausea.  No recent antibiotics or sick contacts. CT abdomen/pelvis was performed which showed distal colonic diverticulosis with no diverticulitis.  Left Spigelian hernia with nonobstructed small bowel loop; and wall thickening of the distal gastric antrum/pylorus concerning for possible gastritis or PUD. She was admitted and started on Protonix and GI was consulted. She underwent EGD on 07/09/2022 which showed large prepyloric ulcer with no active bleeding and underlying gastritis, hiatal hernia, and lower esophageal ring.  She was recommended for continuing on Protonix twice daily for 8 weeks followed by daily Protonix until repeat EGD.

## 2022-07-09 NOTE — Op Note (Signed)
[facility] Patient Name: Susan Ford Procedure Date: 07/09/2022 MRN: [id number] Attending MD: Otis Brace , MD, [id number] Date of Birth: March 13, 1930 CSN: [id number] Age: 89 Admit Type: Inpatient Procedure:                Upper GI endoscopy Indications:              Melena, Abnormal CT of the GI tract Providers:                Otis Brace, MD, Adah Perl RN, RN,                            Cherylynn Ridges, [place] CRNA Referring MD:              Medicines:                Sedation Administered by an Anesthesia Professional Complications:            No immediate complications. Estimated Blood Loss:     Estimated blood loss was minimal. Procedure:                Pre-Anesthesia Assessment:                           - Prior to the procedure, a History and Physical                            was performed, and patient medications and                            allergies were reviewed. The patient's tolerance of                            previous anesthesia was also reviewed. The risks                            and benefits of the procedure and the sedation                            options and risks were discussed with the patient.                            All questions were answered, and informed consent                            was obtained. Prior Anticoagulants: The patient has                            taken no anticoagulant or antiplatelet agents. ASA                            Grade Assessment: III - A patient with severe                            systemic disease. After reviewing the risks and  benefits, the patient was deemed in satisfactory                            condition to undergo the procedure.                           After obtaining informed consent, the endoscope was                            passed under direct vision. Throughout the                            procedure, the  patient's blood pressure, pulse, and                            oxygen saturations were monitored continuously. The                            [id number]-[id number] Olympus endoscope was introduced                            through the mouth, and advanced to the second part                            of duodenum. The upper GI endoscopy was                            accomplished without difficulty. The patient                            tolerated the procedure well. Scope In: Scope Out: Findings:      A non-obstructing Schatzki ring was found at the gastroesophageal       junction.      A small hiatal hernia was present.      One non-bleeding cratered gastric ulcer with pigmented material was       found in the prepyloric region of the stomach. The lesion was 15 mm in       largest dimension. Biopsies were taken with a cold forceps for histology.      Diffuse moderate inflammation characterized by congestion (edema) and       erythema was found in the entire examined stomach.      The cardia and gastric fundus were normal on retroflexion.      The duodenal bulb, first portion of the duodenum and second portion of       the duodenum were normal. Impression:               - Non-obstructing Schatzki ring.                           - Small hiatal hernia.                           - Non-bleeding gastric ulcer with pigmented  material. Biopsied.                           - Gastritis.                           - Normal duodenal bulb, first portion of the                            duodenum and second portion of the duodenum. Moderate Sedation:      Moderate (conscious) sedation was personally administered by an       anesthesia professional. The following parameters were monitored: oxygen       saturation, heart rate, blood pressure, and response to care. Recommendation:           - Return patient to hospital ward for ongoing care.                           - Soft  diet.                           - Continue present medications.                           - Repeat upper endoscopy in 2 months to check                            healing.                           - Return to my office in 2 months.                           - No ibuprofen, naproxen, or other non-steroidal                            anti-inflammatory drugs. Procedure Code(s):        --- Professional ---                           [phone], Esophagogastroduodenoscopy, flexible,                            transoral; with biopsy, single or multiple Diagnosis Code(s):        --- Professional ---                           K22.2, Esophageal obstruction                           K44.9, Diaphragmatic hernia without obstruction or                            gangrene                           K25.9, Gastric ulcer, unspecified as acute or  chronic, without hemorrhage or perforation                           K29.70, Gastritis, unspecified, without bleeding                           K92.1, Melena (includes Hematochezia)                           R93.3, Abnormal findings on diagnostic imaging of                            other parts of digestive tract CPT copyright 2022 American [employer]. All rights reserved. The codes documented in this report are preliminary and upon coder review may  be revised to meet current compliance requirements. Otis Brace, MD Otis Brace, MD 07/09/2022 12:00:45 PM Number of Addenda: 0

## 2022-07-09 NOTE — Assessment & Plan Note (Addendum)
-   on celebrex at home; discussed with GI and will keep on hold at discharge

## 2022-07-09 NOTE — Consult Note (Signed)
Referring Provider: [facility] Primary Care Physician:  Burnis Medin, MD Primary Gastroenterologist: Previous patient of Dr. Wynetta Emery  Reason for Consultation: Melena, abnormal CT scan  HPI: Susan Ford is a 89 y.o. female with past medical history of GERD and cholecystectomy presented to the hospital with black stool of 2 days duration.  She started noticing black loose stools yesterday.  Denies any associated abdominal pain, nausea or vomiting.  Reflux is well-controlled.  She denies taking any NSAIDs.  Denies similar symptoms in the past.  Denies bright red blood per rectum.  Blood work on admission showed normal hemoglobin of 12.2.  Hemoglobin decreased to 10.8 today.  Elevated BUN.  Normal LFTs.  Normal INR.  CT abdomen pelvis with IV contrast showed wall thickening of the gastric antrum and pylorus and small hiatal hernia.   Past Medical History:  Diagnosis Date   Arthritis    Cataract 2007   bilateral cat ext    Cholecystitis    Complication of anesthesia Feb 21, 2014   atrial fib after cholecystectomy, lasted a few hours, none since   Dysrhythmia 02-21-2014   after cholecystectomy had atrial fib for a few hours, none since   GERD (gastroesophageal reflux disease)    Had endoscopy no history of Barrett's   Hx of nonmelanoma skin cancer    Unspecified hereditary and idiopathic peripheral neuropathy     Past Surgical History:  Procedure Laterality Date   ABDOMINAL HYSTERECTOMY  15 yrs ago   complete   BREAST CYST ASPIRATION     CHOLECYSTECTOMY N/A 02/21/2014   Procedure: LAPAROSCOPIC CHOLECYSTECTOMY WITH INTRAOPERATIVE CHOLANGIOGRAM;  Surgeon: Doreen Salvage, MD;  Location: [facility] OR;  Service: General;  Laterality: N/A;   ENDOSCOPIC RETROGRADE CHOLANGIOPANCREATOGRAPHY (ERCP) WITH PROPOFOL N/A 05/29/2014   Procedure: ENDOSCOPIC RETROGRADE CHOLANGIOPANCREATOGRAPHY (ERCP) WITH PROPOFOL;  Surgeon: Arta Silence, MD;  Location: [facility] ENDOSCOPY;  Service: Endoscopy;  Laterality: N/A;    ERCP N/A 02/24/2014   Procedure: ENDOSCOPIC RETROGRADE CHOLANGIOPANCREATOGRAPHY (ERCP);  Surgeon: Arta Silence, MD;  Location: [facility] ENDOSCOPY;  Service: Endoscopy;  Laterality: N/A;   EYE SURGERY Bilateral 2007   both eyes cataracts with lens replacments   LUMBAR LAMINECTOMY/DECOMPRESSION MICRODISCECTOMY  02/01/2012   Procedure: LUMBAR LAMINECTOMY/DECOMPRESSION MICRODISCECTOMY 2 LEVELS;  Surgeon: Kristeen Miss, MD;  Location: [place] [facility]URO ORS;  Service: Neurosurgery;  Laterality: Left;  Left Lumbar Four-Five Lumbar Five-Sacral One Laminectomies/Foraminotomies/Microscope   SPYGLASS CHOLANGIOSCOPY N/A 05/29/2014   Procedure: SPYGLASS CHOLANGIOSCOPY;  Surgeon: Arta Silence, MD;  Location: [facility] ENDOSCOPY;  Service: Endoscopy;  Laterality: N/A;   TONSILLECTOMY  age 26    Prior to Admission medications   Medication Sig Start Date End Date Taking? Authorizing Provider  acetaminophen (TYLENOL) 500 MG tablet Take 500 mg by mouth every 6 (six) hours as needed for mild pain.    Yes [provider]  Apoaequorin (PREVAGEN) 10 MG CAPS Take 1 capsule by mouth daily.   Yes [provider]  estradiol (CLIMARA - DOSED IN MG/24 HR) 0.025 mg/24hr patch PLACE 1 PATCH (0.025 MG TOTAL) ONTO THE SKIN ONCE A WEEK ON SATURDAY 01/06/22  Yes Panosh, Standley Brooking, MD  gabapentin (NEURONTIN) 600 MG tablet TAKE 1 TABLET BY MOUTH FOUR TIMES A DAY 06/23/22  Yes Marcial Pacas, MD  Multiple Vitamin (MULTIVITAMIN) tablet Take 1 tablet by mouth daily after lunch.   Yes [provider]  simvastatin (ZOCOR) 10 MG tablet TAKE 1 TABLET BY MOUTH EVERYDAY AT BEDTIME 06/20/22  Yes Dutch Quint B, FNP  azithromycin (ZITHROMAX Z-PAK) 250  MG tablet As directed Patient not taking: Reported on 07/09/2022 02/19/22   Laurey Morale, MD  celecoxib (CELEBREX) 200 MG capsule Take 1 capsule (200 mg total) by mouth daily. Patient not taking: Reported on 07/09/2022 09/07/16   Burnis Medin, MD    Scheduled Meds:  gabapentin  600 mg Oral  QID   pantoprazole (PROTONIX) IV  40 mg Intravenous Q12H   simvastatin  10 mg Oral q1800   Continuous Infusions:  sodium chloride 20 mL/hr at 07/08/22 2048   PRN Meds:.acetaminophen **OR** acetaminophen, lip balm, melatonin, ondansetron **OR** ondansetron (ZOFRAN) IV, polyvinyl alcohol  Allergies as of 07/08/2022 - Review Complete 07/08/2022  Allergen Reaction Noted   Keflex [cephalexin]  03/04/2016   Lyrica [pregabalin] Other (See Comments) 07/13/2012   Oxytetracycline Other (See Comments) 12/12/2006   Memantine Rash 06/29/2016    Family History  Problem Relation Age of Onset   Neurodegenerative disease Mother        Shy Drager died at 44   Alcohol abuse Other    Heart disease Other     Social History   Socioeconomic History   Marital status: Widowed    Spouse name: Not on file   Number of children: 3   Years of education: 15 years   Highest education level: Associate degree: occupational, Hotel manager, or vocational program  Occupational History   Occupation: Retired    Comment: Therapist, sports  Tobacco Use   Smoking status: Former    Packs/day: 0.25    Years: 7.00    Additional pack years: 0.00    Total pack years: 1.75    Types: Cigarettes    Quit date: 04/27/1963    Years since quitting: 59.2   Smokeless tobacco: Never  Substance and Sexual Activity   Alcohol use: Yes    Alcohol/week: 7.0 standard drinks of alcohol    Types: 7 Glasses of wine per week    Comment: 1 glass of wine daily   Drug use: No    Types: Hydrocodone   Sexual activity: Yes  Other Topics Concern   Not on file  Social History Narrative   Retired Therapist, sports    widowed since    Spring  17    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 Misses working the floor.          Social Determinants of Health   Financial Resource Strain: Low Risk  (06/04/2022)   Overall Financial Resource Strain (CARDIA)    Difficulty of Paying Living Expenses: Not hard at all  Food Insecurity: No Food Insecurity (06/04/2022)   Hunger Vital Sign    Worried About Running Out of Food in the Last Year: Never true    Ran Out of Food in the Last Year: Never true  Transportation Needs: No Transportation Needs (06/04/2022)   PRAPARE - Hydrologist (Medical): No    Lack of Transportation (Non-Medical): No  Physical Activity: Inactive (06/04/2022)   Exercise Vital Sign    Days of Exercise per Week: 0 days    Minutes of Exercise per Session: 0 min  Stress: No Stress  Concern Present (06/04/2022)   [place]    Feeling of Stress : Not at all  Social Connections: Moderately Integrated (06/04/2022)   Social Connection and Isolation Panel [NHANES]    Frequency of Communication with Friends and Family: More than three times a week    Frequency of Social Gatherings with Friends and Family: More than three times a week    Attends Religious Services: More than 4 times per year    Active Member of [employer] or Organizations: Yes    Attends Archivist Meetings: More than 4 times per year    Marital Status: Widowed  Intimate Partner Violence: Not At Risk (06/04/2022)   Humiliation, Afraid, Rape, and Kick questionnaire    Fear of Current or Ex-Partner: No    Emotionally Abused: No    Physically Abused: No    Sexually Abused: No    Review of Systems: All negative except as stated above in HPI.  Physical Exam: Vital signs: Vitals:   07/09/22 0009 07/09/22 0426  BP: 122/69 127/70  Pulse: 76 85  Resp: 14 14  Temp: 98.2 F (36.8 C) 98.5 F (36.9 C)  SpO2: 95% 95%   Last BM Date : 07/08/22 General:   Alert,  Well-developed, well-nourished, pleasant  and cooperative in NAD Lungs: No visible respiratory distress Heart:  Regular rate and rhythm; no murmurs, clicks, rubs,  or gallops. Abdomen: Soft, nontender, nondistended, bowel sounds present, no peritoneal signs Alert and oriented x 3 Mood and affect normal Rectal:  Deferred  GI:  Lab Results: Recent Labs    07/08/22 1200 07/08/22 2058 07/09/22 0657  WBC 6.7  --  7.7  HGB 12.2 11.6* 10.8*  HCT 37.1 34.7* 32.4*  PLT 271  --  239   BMET Recent Labs    07/08/22 1200 07/09/22 0657  NA 135 133*  K 4.3 4.0  CL 102 102  CO2 22 24  GLUCOSE 105* 97  BUN 31* 20  CREATININE 0.82 0.71  CALCIUM 8.7* 8.5*   LFT Recent Labs    07/08/22 1200  PROT 6.9  ALBUMIN 3.8  AST 21  ALT 14  ALKPHOS 58  BILITOT 1.0   PT/INR Recent Labs    07/09/22 0657  LABPROT 12.3  INR 0.9     Studies/Results: CT ABDOMEN PELVIS W CONTRAST  Result Date: 07/08/2022 CLINICAL DATA:  Loose liquid dark tarry stools x 4 since midnight, suspected diverticulitis EXAM: CT ABDOMEN AND PELVIS WITH CONTRAST TECHNIQUE: Multidetector CT imaging of the abdomen and pelvis was performed using the standard protocol following bolus administration of intravenous contrast. RADIATION DOSE REDUCTION: This exam was performed according to the departmental dose-optimization program which includes automated exposure control, adjustment of the mA and/or kV according to patient size and/or use of iterative reconstruction technique. CONTRAST:  166mL OMNIPAQUE IOHEXOL 300 MG/ML SOLN IV. No oral contrast. COMPARISON:  02/21/2014 FINDINGS: Lower chest: Bibasilar chronic interstitial lung disease changes. BILATERAL posterior diaphragmatic defects Bochdalek type containing fat. Hepatobiliary: Gallbladder surgically absent. No hepatic abnormalities. Pancreas: Normal appearance Spleen: Normal appearance Adrenals/Urinary Tract: Adrenal thickening without mass. Kidneys, ureters, and bladder normal appearance Stomach/Bowel: Descending  and sigmoid diverticulosis without evidence of diverticulitis. LEFT spigelian hernia containing a nonobstructed small bowel loop. Remaining small bowel loops unremarkable. Appendix not visualized, no pericecal inflammatory process seen. Probable small hiatal hernia. Stomach decompressed. Wall thickening of distal gastric antrum/pylorus, appears edematous, without discrete ulcer, mass, or enhancement. Vascular/Lymphatic: Atherosclerotic calcifications aorta, iliac  arteries, coronary arteries, origins of visceral arteries. Aorta normal caliber without aneurysm. No adenopathy. Reproductive: Uterus surgically absent. Nonvisualization of ovaries. Other: No free air or free fluid. No definite inflammatory process seen. Musculoskeletal: Osseous demineralization. Multilevel degenerative disc and facet disease changes lumbar spine. Inferior endplate compression fracture of T11 unchanged. IMPRESSION: Distal colonic diverticulosis without evidence of diverticulitis. LEFT Spigelian hernia containing a nonobstructed small bowel loop. Probable small hiatal hernia. Wall thickening of distal gastric antrum/pylorus, appears edematous, without discrete ulcer, mass, or enhancement visualized; this could represent gastritis or peptic ulcer disease. Chronic interstitial lung disease changes at lung bases. Extensive atherosclerotic calcifications including coronary arteries. Aortic Atherosclerosis (ICD10-I70.0). Electronically Signed   By: Lavonia Dana M.D.   On: 07/08/2022 13:38    Impression/Plan: -Melena with CT scan showing wall thickening of the distal gastric antrum and pylorus.  Concerning for ulcer disease. -Acute blood loss anemia.  Recommendations --------------------------- -Proceed with EGD today.  Continue PPI.  Risks (bleeding, infection, bowel perforation that could require surgery, sedation-related changes in cardiopulmonary systems), benefits (identification and possible treatment of source of symptoms,  exclusion of certain causes of symptoms), and alternatives (watchful waiting, radiographic imaging studies, empiric medical treatment)  were explained to patient/family in detail and patient wishes to proceed.     LOS: 0 days   Otis Brace  MD, FACP 07/09/2022, 10:01 AM  Contact #  [phone]

## 2022-07-09 NOTE — Anesthesia Procedure Notes (Signed)
Procedure Name: MAC Date/Time: 07/09/2022 11:45 AM  Performed by: Renato Shin, CRNAPre-anesthesia Checklist: Patient identified, Emergency Drugs available, Suction available and Patient being monitored Patient Re-evaluated:Patient Re-evaluated prior to induction Oxygen Delivery Method: Simple face mask Preoxygenation: Pre-oxygenation with 100% oxygen Induction Type: IV induction Airway Equipment and Method: Bite block Placement Confirmation: positive ETCO2 and breath sounds checked- equal and bilateral Dental Injury: Teeth and Oropharynx as per pre-operative assessment

## 2022-07-09 NOTE — Discharge Instructions (Signed)
Do NOT resume celebrex until cleared by gastroenterology

## 2022-07-09 NOTE — Anesthesia Postprocedure Evaluation (Signed)
Anesthesia Post Note  Patient: Susan Ford  Procedure(s) Performed: ESOPHAGOGASTRODUODENOSCOPY (EGD) WITH PROPOFOL BIOPSY     Patient location during evaluation: PACU Anesthesia Type: MAC Level of consciousness: awake and alert Pain management: pain level controlled Vital Signs Assessment: post-procedure vital signs reviewed and stable Respiratory status: spontaneous breathing, nonlabored ventilation, respiratory function stable and patient connected to nasal cannula oxygen Cardiovascular status: stable and blood pressure returned to baseline Postop Assessment: no apparent nausea or vomiting Anesthetic complications: no  No notable events documented.  Last Vitals:  Vitals:   07/09/22 1205 07/09/22 1210  BP: (!) 95/46 (!) 104/39  Pulse: 84 77  Resp: 20 20  Temp:    SpO2: 98% 96%    Last Pain:  Vitals:   07/09/22 1210  TempSrc:   PainSc: 0-No pain                 Journei Thomassen S

## 2022-07-09 NOTE — Discharge Summary (Signed)
Physician Discharge Summary   Susan Ford [id number] DOB: 11-29-1929 DOA: 07/08/2022  PCP: Burnis Medin, MD  Admit date: 07/08/2022 Discharge date:  07/09/2022  Admitted From: Home Disposition:  Home Discharging physician: Dwyane Dee, MD Barriers to discharge: none  Recommendations for Outpatient Follow-up:  Follow up with GI for repeat EGD   Discharge Condition: stable CODE STATUS: Full Diet recommendation:  Diet Orders (From admission, onward)     Start     Ordered   07/09/22 1153  DIET SOFT Fluid consistency: Thin  Diet effective now       Question:  Fluid consistency:  Answer:  Thin   07/09/22 1155            Hospital Course: Susan Ford is a 89 yo female with PMH GERD, neuropathy, arthritis who presented with dark stools. She denied any history of similar.  Also denied any fevers, chills, abdominal pain, nausea.  No recent antibiotics or sick contacts. CT abdomen/pelvis was performed which showed distal colonic diverticulosis with no diverticulitis.  Left Spigelian hernia with nonobstructed small bowel loop; and wall thickening of the distal gastric antrum/pylorus concerning for possible gastritis or PUD. She was admitted and started on Protonix and GI was consulted. She underwent EGD on 07/09/2022 which showed large prepyloric ulcer with no active bleeding and underlying gastritis, hiatal hernia, and lower esophageal ring.  She was recommended for continuing on Protonix twice daily for 8 weeks followed by daily Protonix until repeat EGD.  Assessment and Plan: * Melena - s/p EGD on 3/15: "EGD showed large prepyloric ulcer with pigmented material without any active bleeding. Biopsies performed. Also showed gastritis, hiatal hernia and lower esophageal ring. " - continue protonix 40 mg BID x 8 weeks followed by protonix 40 mg daily - repeat EGD with GI in 2-3 months  Osteoarthritis - on celebrex at home; discussed with GI and will keep on hold at  discharge   Dementia ([place]) - No home medications noted.   Neuropathy - Continue gabapentin    The patient's chronic medical conditions were treated accordingly per the patient's home medication regimen except as noted.  On day of discharge, patient was felt deemed stable for discharge. Patient/family member advised to call PCP or come back to ER if needed.   Principal Diagnosis: Melena  Discharge Diagnoses: Active Hospital Problems   Diagnosis Date Noted   Melena 07/08/2022    Priority: 1.   Osteoarthritis 05/15/2010    Priority: 2.   Dementia ([place]) 10/03/2017   Neuropathy 04/03/2011    Resolved Hospital Problems  No resolved problems to display.     Discharge Instructions     Increase activity slowly   Complete by: As directed       Allergies as of 07/09/2022       Reactions   Keflex [cephalexin]    Dizziness   Lyrica [pregabalin] Other (See Comments)   "High Fever"   Oxytetracycline Other (See Comments)   "High Fever" caused by terramycin   Memantine Rash        Medication List     STOP taking these medications    azithromycin 250 MG tablet Commonly known as: Zithromax Z-Pak   celecoxib 200 MG capsule Commonly known as: CELEBREX       TAKE these medications    acetaminophen 500 MG tablet Commonly known as: TYLENOL Take 500 mg by mouth every 6 (six) hours as needed for mild pain.   estradiol 0.025 mg/24hr patch Commonly known as:  CLIMARA - Dosed in mg/24 hr PLACE 1 PATCH (0.025 MG TOTAL) ONTO THE SKIN ONCE A WEEK ON SATURDAY   gabapentin 600 MG tablet Commonly known as: NEURONTIN TAKE 1 TABLET BY MOUTH FOUR TIMES A DAY   multivitamin tablet Take 1 tablet by mouth daily after lunch.   pantoprazole 40 MG tablet Commonly known as: Protonix Take 1 tablet twice a day for 8 weeks then 1 tablet daily   Prevagen 10 MG Caps Generic drug: Apoaequorin Take 1 capsule by mouth daily.   simvastatin 10 MG tablet Commonly known as: ZOCOR TAKE  1 TABLET BY MOUTH EVERYDAY AT BEDTIME        Follow-up Information     Brahmbhatt, Parag, MD. Schedule an appointment as soon as possible for a visit in 2 month(s).   Specialty: Gastroenterology Why: Follow-up in 2 months-for large gastric ulcer Contact information: [address] [place] [address] [phone]                Allergies  Allergen Reactions   Keflex [Cephalexin]     Dizziness   Lyrica [Pregabalin] Other (See Comments)    "High Fever"    Oxytetracycline Other (See Comments)    "High Fever" caused by terramycin   Memantine Rash    Consultations: GI  Procedures: 07/09/22: EGD  Discharge Exam: BP (!) 115/48   Pulse 77   Temp 97.7 F (36.5 C) (Temporal)   Resp 17   Ht 5\' 4"  (1.626 m)   Wt 54.4 kg   SpO2 96%   BMI 20.59 kg/m  Physical Exam Constitutional:      General: She is not in acute distress.    Appearance: Normal appearance.  HENT:     Head: Normocephalic and atraumatic.     Mouth/Throat:     Mouth: Mucous membranes are moist.  Eyes:     Extraocular Movements: Extraocular movements intact.  Cardiovascular:     Rate and Rhythm: Normal rate and regular rhythm.  Pulmonary:     Effort: Pulmonary effort is normal. No respiratory distress.     Breath sounds: Normal breath sounds. No wheezing.  Abdominal:     General: Bowel sounds are normal. There is no distension.     Palpations: Abdomen is soft.     Tenderness: There is no abdominal tenderness.  Musculoskeletal:        General: No swelling. Normal range of motion.     Cervical back: Normal range of motion and neck supple.  Skin:    General: Skin is warm and dry.  Neurological:     General: No focal deficit present.     Mental Status: She is alert.  Psychiatric:        Mood and Affect: Mood normal.      The results of significant diagnostics from this hospitalization (including imaging, microbiology, ancillary and laboratory) are listed below for reference.    Microbiology: No results found for this or any previous visit (from the past 240 hour(s)).   Labs: BNP (last 3 results) No results for input(s): "BNP" in the last 8760 hours. Basic Metabolic Panel: Recent Labs  Lab 07/08/22 1200 07/09/22 0657  NA 135 133*  K 4.3 4.0  CL 102 102  CO2 22 24  GLUCOSE 105* 97  BUN 31* 20  CREATININE 0.82 0.71  CALCIUM 8.7* 8.5*   Liver Function Tests: Recent Labs  Lab 07/08/22 1200  AST 21  ALT 14  ALKPHOS 58  BILITOT 1.0  PROT 6.9  ALBUMIN 3.8   Recent Labs  Lab 07/08/22 1200  LIPASE 30   No results for input(s): "AMMONIA" in the last 168 hours. CBC: Recent Labs  Lab 07/08/22 1200 07/08/22 2058 07/09/22 0657  WBC 6.7  --  7.7  NEUTROABS 3.9  --   --   HGB 12.2 11.6* 10.8*  HCT 37.1 34.7* 32.4*  MCV 94.9  --  95.9  PLT 271  --  239   Cardiac Enzymes: No results for input(s): "CKTOTAL", "CKMB", "CKMBINDEX", "TROPONINI" in the last 168 hours. BNP: Invalid input(s): "POCBNP" CBG: No results for input(s): "GLUCAP" in the last 168 hours. D-Dimer No results for input(s): "DDIMER" in the last 72 hours. Hgb A1c No results for input(s): "HGBA1C" in the last 72 hours. Lipid Profile No results for input(s): "CHOL", "HDL", "LDLCALC", "TRIG", "CHOLHDL", "LDLDIRECT" in the last 72 hours. Thyroid function studies No results for input(s): "TSH", "T4TOTAL", "T3FREE", "THYROIDAB" in the last 72 hours.  Invalid input(s): "FREET3" Anemia work up No results for input(s): "VITAMINB12", "FOLATE", "FERRITIN", "TIBC", "IRON", "RETICCTPCT" in the last 72 hours. Urinalysis    Component Value Date/Time   COLORURINE YELLOW 07/05/2017 1825   APPEARANCEUR CLEAR 07/05/2017 1825   LABSPEC 1.025 01/23/2022 1345   PHURINE 6.0 01/23/2022 1345   GLUCOSEU NEGATIVE 01/23/2022 1345   GLUCOSEU NEGATIVE 02/26/2016 1031   HGBUR NEGATIVE 01/23/2022 1345   BILIRUBINUR NEGATIVE 01/23/2022 1345   BILIRUBINUR neg 05/05/2021 1059   [place]  01/23/2022 1345   PROTEINUR NEGATIVE 01/23/2022 1345   UROBILINOGEN 0.2 01/23/2022 1345   NITRITE POSITIVE (A) 01/23/2022 1345   LEUKOCYTESUR TRACE (A) 01/23/2022 1345   Sepsis Labs Recent Labs  Lab 07/08/22 1200 07/09/22 0657  WBC 6.7 7.7   Microbiology No results found for this or any previous visit (from the past 240 hour(s)).  Procedures/Studies: CT ABDOMEN PELVIS W CONTRAST  Result Date: 07/08/2022 CLINICAL DATA:  Loose liquid dark tarry stools x 4 since midnight, suspected diverticulitis EXAM: CT ABDOMEN AND PELVIS WITH CONTRAST TECHNIQUE: Multidetector CT imaging of the abdomen and pelvis was performed using the standard protocol following bolus administration of intravenous contrast. RADIATION DOSE REDUCTION: This exam was performed according to the departmental dose-optimization program which includes automated exposure control, adjustment of the mA and/or kV according to patient size and/or use of iterative reconstruction technique. CONTRAST:  183mL OMNIPAQUE IOHEXOL 300 MG/ML SOLN IV. No oral contrast. COMPARISON:  02/21/2014 FINDINGS: Lower chest: Bibasilar chronic interstitial lung disease changes. BILATERAL posterior diaphragmatic defects [place] type containing fat. Hepatobiliary: Gallbladder surgically absent. No hepatic abnormalities. Pancreas: Normal appearance Spleen: Normal appearance Adrenals/Urinary Tract: Adrenal thickening without mass. Kidneys, ureters, and bladder normal appearance Stomach/Bowel: Descending and sigmoid diverticulosis without evidence of diverticulitis. LEFT spigelian hernia containing a nonobstructed small bowel loop. Remaining small bowel loops unremarkable. Appendix not visualized, no pericecal inflammatory process seen. Probable small hiatal hernia. Stomach decompressed. Wall thickening of distal gastric antrum/pylorus, appears edematous, without discrete ulcer, mass, or enhancement. Vascular/Lymphatic: Atherosclerotic calcifications aorta, iliac  arteries, coronary arteries, origins of visceral arteries. Aorta normal caliber without aneurysm. No adenopathy. Reproductive: Uterus surgically absent. Nonvisualization of ovaries. Other: No free air or free fluid. No definite inflammatory process seen. Musculoskeletal: Osseous demineralization. Multilevel degenerative disc and facet disease changes lumbar spine. Inferior endplate compression fracture of T11 unchanged. IMPRESSION: Distal colonic diverticulosis without evidence of diverticulitis. LEFT Spigelian hernia containing a nonobstructed small bowel loop. Probable small hiatal hernia. Wall thickening of distal gastric antrum/pylorus, appears edematous, without discrete  ulcer, mass, or enhancement visualized; this could represent gastritis or peptic ulcer disease. Chronic interstitial lung disease changes at lung bases. Extensive atherosclerotic calcifications including coronary arteries. Aortic Atherosclerosis (ICD10-I70.0). Electronically Signed   By: Lavonia Dana M.D.   On: 07/08/2022 13:38     Time coordinating discharge: Over 30 minutes    Dwyane Dee, MD  [facility] 07/09/2022, 1:10 PM

## 2022-07-12 ENCOUNTER — Telehealth: Payer: Self-pay

## 2022-07-12 LAB — SURGICAL PATHOLOGY

## 2022-07-12 NOTE — Transitions of Care (Post Inpatient/ED Visit) (Unsigned)
   07/12/2022  Name: Susan Ford MRN: [id number] DOB: 04/29/1929  Today's TOC FU Call Status: Today's TOC FU Call Status:: Unsuccessul Call (1st Attempt) Unsuccessful Call (1st Attempt) Date: 07/12/22  Attempted to reach the patient regarding the most recent Inpatient/ED visit.  Follow Up Plan: Additional outreach attempts will be made to reach the patient to complete the Transitions of Care (Post Inpatient/ED visit) call.   [place] LPN [place] Advisor Direct Dial [phone]

## 2022-07-13 NOTE — Transitions of Care (Post Inpatient/ED Visit) (Signed)
   07/13/2022  Name: Susan Ford MRN: [id number] DOB: 12-Jun-1929  Today's TOC FU Call Status: Today's TOC FU Call Status:: Unsuccessful Call (2nd Attempt) Unsuccessful Call (1st Attempt) Date: 07/12/22 Unsuccessful Call (2nd Attempt) Date: 07/13/22  Attempted to reach the patient regarding the most recent Inpatient/ED visit.  Follow Up Plan: No further outreach attempts will be made at this time. We have been unable to contact the patient.  [place] LPN [place] Advisor Direct Dial [phone]

## 2022-07-21 ENCOUNTER — Other Ambulatory Visit: Payer: Self-pay | Admitting: Internal Medicine

## 2022-07-22 ENCOUNTER — Ambulatory Visit: Payer: Medicare Other | Admitting: Neurology

## 2022-08-24 ENCOUNTER — Encounter: Payer: Self-pay | Admitting: Internal Medicine

## 2022-08-25 ENCOUNTER — Ambulatory Visit (INDEPENDENT_AMBULATORY_CARE_PROVIDER_SITE_OTHER): Payer: Medicare Other | Admitting: Internal Medicine

## 2022-08-25 ENCOUNTER — Encounter: Payer: Self-pay | Admitting: Internal Medicine

## 2022-08-25 ENCOUNTER — Ambulatory Visit: Payer: Medicare Other | Admitting: Internal Medicine

## 2022-08-25 VITALS — BP 106/70 | HR 67 | Temp 98.0°F | Ht 64.0 in | Wt 126.0 lb

## 2022-08-25 DIAGNOSIS — R6889 Other general symptoms and signs: Secondary | ICD-10-CM

## 2022-08-25 DIAGNOSIS — R251 Tremor, unspecified: Secondary | ICD-10-CM

## 2022-08-25 DIAGNOSIS — Z79899 Other long term (current) drug therapy: Secondary | ICD-10-CM | POA: Diagnosis not present

## 2022-08-25 DIAGNOSIS — G609 Hereditary and idiopathic neuropathy, unspecified: Secondary | ICD-10-CM | POA: Diagnosis not present

## 2022-08-25 DIAGNOSIS — R54 Age-related physical debility: Secondary | ICD-10-CM

## 2022-08-25 LAB — BASIC METABOLIC PANEL
BUN: 11 mg/dL (ref 6–23)
CO2: 22 mEq/L (ref 19–32)
Calcium: 8.5 mg/dL (ref 8.4–10.5)
Chloride: 103 mEq/L (ref 96–112)
Creatinine, Ser: 0.65 mg/dL (ref 0.40–1.20)
GFR: 77.12 mL/min (ref >60.00)
Glucose, Bld: 97 mg/dL (ref 70–99)
Potassium: 4.3 mEq/L (ref 3.5–5.1)
Sodium: 134 mEq/L — ABNORMAL LOW (ref 135–145)

## 2022-08-25 LAB — CBC WITH DIFFERENTIAL/PLATELET
Basophils Absolute: 0.1 10*3/uL (ref 0.0–0.1)
Basophils Relative: 1.5 % (ref 0.0–3.0)
Eosinophils Absolute: 0.9 10*3/uL — ABNORMAL HIGH (ref 0.0–0.7)
Eosinophils Relative: 12.6 % — ABNORMAL HIGH (ref 0.0–5.0)
HCT: 35.7 % — ABNORMAL LOW (ref 36.0–46.0)
Hemoglobin: 12.3 g/dL (ref 12.0–15.0)
Lymphocytes Relative: 28.9 % (ref 12.0–46.0)
Lymphs Abs: 2.1 10*3/uL (ref 0.7–4.0)
MCHC: 34.4 g/dL (ref 30.0–36.0)
MCV: 93.5 fl (ref 78.0–100.0)
Monocytes Absolute: 0.6 10*3/uL (ref 0.1–1.0)
Monocytes Relative: 8.2 % (ref 3.0–12.0)
Neutro Abs: 3.5 10*3/uL (ref 1.4–7.7)
Neutrophils Relative %: 48.8 % (ref 43.0–77.0)
Platelets: 259 10*3/uL (ref 150.0–400.0)
RBC: 3.82 Mil/uL — ABNORMAL LOW (ref 3.87–5.11)
RDW: 12.2 % (ref 11.5–15.5)
WBC: 7.2 10*3/uL (ref 4.0–10.5)

## 2022-08-25 LAB — TSH: TSH: 0.7 u[IU]/mL (ref 0.35–5.50)

## 2022-08-25 LAB — T4, FREE: Free T4: 0.75 ng/dL (ref 0.60–1.60)

## 2022-08-25 NOTE — Patient Instructions (Signed)
Bp is ok 120/70 range . Exam is stable  Can let us know if  want to proceed with  ordering a motorized scooter.  Lab today anemia fu thyroid  and go from there .

## 2022-08-25 NOTE — Progress Notes (Signed)
Chief Complaint  Patient presents with   Dizziness    Pt c/o dizziness. Intermittent. Going on couple months.   Tremors    Pt c/o of shakiness on both arm and hand. Intermittent. Going on couple months.     HPI: Susan Ford 89 y.o. come in for "dizziness"  but actually describes as a shakiness when reaching  but no change in balance and no falling  using rollater walking and cane  On gabapentin 600 tid for neuropathy says for quite a while  so doesn't really think from the gabapentin Coffee in am suga r cookie  sandwich    and dinner at facility. Son sent message about getting  motorized scooter be cause it is very far to get to dining area   for her  although she says may be good "exercise "for her. To keep up stamina at her age.   ROS: See pertinent positives and negatives per HPI. No syncop current bleeding new gi gu sx   Past Medical History:  Diagnosis Date   Arthritis    Cataract 2007   bilateral cat ext    Cholecystitis    Complication of anesthesia Feb 21, 2014   atrial fib after cholecystectomy, lasted a few hours, none since   Dysrhythmia 02-21-2014   after cholecystectomy had atrial fib for a few hours, none since   GERD (gastroesophageal reflux disease)    Had endoscopy no history of Barrett's   Hx of nonmelanoma skin cancer    Unspecified hereditary and idiopathic peripheral neuropathy     Family History  Problem Relation Age of Onset   Neurodegenerative disease Mother        Shy Drager died at 20   Alcohol abuse Other    Heart disease Other     Social History   Socioeconomic History   Marital status: Widowed    Spouse name: Not on file   Number of children: 3   Years of education: 15 years   Highest education level: Associate degree: occupational, Scientist, product/process development, or vocational program  Occupational History   Occupation: Retired    Comment: Charity fundraiser  Tobacco Use   Smoking status: Former    Packs/day: 0.25    Years: 7.00    Additional pack years:  0.00    Total pack years: 1.75    Types: Cigarettes    Quit date: 04/27/1963    Years since quitting: 59.3   Smokeless tobacco: Never  Substance and Sexual Activity   Alcohol use: Yes    Alcohol/week: 7.0 standard drinks of alcohol    Types: 7 Glasses of wine per week    Comment: 1 glass of wine daily   Drug use: No    Types: Hydrocodone   Sexual activity: Yes  Other Topics Concern   Not on file  Social History Narrative   Retired Charity fundraiser    widowed since    Spring  17    [facility] of 1   Regular exercise former smoker   G3P3   From [place] originally      05/12/2018:   Lives alone on ranch house, but has stairs to garage with "acorn chair" lift    Son in [place], and two daughters out of state, all of whom are supportive    Worked in NICU, 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          Social Determinants of Health   Financial Resource Strain: Low  Risk  (06/04/2022)   Overall Financial Resource Strain (CARDIA)    Difficulty of Paying Living Expenses: Not hard at all  Food Insecurity: No Food Insecurity (06/04/2022)   Hunger Vital Sign    Worried About Running Out of Food in the Last Year: Never true    Ran Out of Food in the Last Year: Never true  Transportation Needs: No Transportation Needs (06/04/2022)   PRAPARE - Administrator, Civil Service (Medical): No    Lack of Transportation (Non-Medical): No  Physical Activity: Inactive (06/04/2022)   Exercise Vital Sign    Days of Exercise per Week: 0 days    Minutes of Exercise per Session: 0 min  Stress: No Stress Concern Present (06/04/2022)   [employer] of Occupational Health - Occupational Stress Questionnaire    Feeling of Stress : Not at all  Social Connections: Moderately Integrated (06/04/2022)   Social Connection and Isolation Panel [NHANES]    Frequency of Communication with Friends and Family: More than three times a week    Frequency of Social Gatherings with  Friends and Family: More than three times a week    Attends Religious Services: More than 4 times per year    Active Member of [employer] or Organizations: Yes    Attends Banker Meetings: More than 4 times per year    Marital Status: Widowed    Outpatient Medications Prior to Visit  Medication Sig Dispense Refill   acetaminophen (TYLENOL) 500 MG tablet Take 500 mg by mouth every 6 (six) hours as needed for mild pain.      Apoaequorin (PREVAGEN) 10 MG CAPS Take 1 capsule by mouth daily.     estradiol (CLIMARA - DOSED IN MG/24 HR) 0.025 mg/24hr patch APPLY ONCE WEEKLY ON SATURDAY 12 patch 0   gabapentin (NEURONTIN) 600 MG tablet TAKE 1 TABLET BY MOUTH FOUR TIMES A DAY 360 tablet 1   Multiple Vitamin (MULTIVITAMIN) tablet Take 1 tablet by mouth daily after lunch.     simvastatin (ZOCOR) 10 MG tablet TAKE 1 TABLET BY MOUTH EVERYDAY AT BEDTIME 90 tablet 1   pantoprazole (PROTONIX) 40 MG tablet Take 1 tablet twice a day for 8 weeks then 1 tablet daily (Patient not taking: Reported on 08/25/2022) 60 tablet 2   No facility-administered medications prior to visit.     EXAM:  BP 106/70 (BP Location: Left Arm, Patient Position: Sitting, Cuff Size: Normal)   Pulse 67   Temp 98 F (36.7 C) (Oral)   Ht 5\' 4"  (1.626 m)   Wt 126 lb (57.2 kg)   SpO2 97%   BMI 21.63 kg/m   Body mass index is 21.63 kg/m. Wt Readings from Last 3 Encounters:  08/25/22 126 lb (57.2 kg)  07/09/22 119 lb 14.9 oz (54.4 kg)  06/04/22 127 lb 14.4 oz (58 kg)  Bp 120/78 standing and sitting pulse in 70 - 80- range  and reg  GENERAL: vitals reviewed and listed above, alert, oriented, appears well hydrated and in no acute distress HEENT: atraumatic, conjunctiva  clear, no obvious abnormalities on inspection of external nose and ears  NECK: no obvious masses on inspection palpation  LUNGS: clear to auscultation bilaterally, no wheezes, rales or rhonchi, good air movement CV: HRRR, no clubbing cyanosis or   peripheral edema nl cap refill  MS: moves all extremities without noticeable focal  abnormality djd changes  no fine tremor today  no cog wheeling  ambulation with roller walker with seat .  Coordination ok for this  Skin on bruising or petechia PSYCH: pleasant and cooperative,  nl conversation  cognition not formally tested today  Lab Results  Component Value Date   WBC 7.7 07/09/2022   HGB 10.8 (L) 07/09/2022   HCT 32.4 (L) 07/09/2022   PLT 239 07/09/2022   GLUCOSE 97 07/09/2022   CHOL 150 05/05/2021   TRIG 191.0 (H) 05/05/2021   HDL 64.30 05/05/2021   LDLDIRECT 153 02/12/2009   LDLCALC 48 05/05/2021   ALT 14 07/08/2022   AST 21 07/08/2022   NA 133 (L) 07/09/2022   K 4.0 07/09/2022   CL 102 07/09/2022   CREATININE 0.71 07/09/2022   BUN 20 07/09/2022   CO2 24 07/09/2022   TSH 1.42 05/05/2021   INR 0.9 07/09/2022   HGBA1C 5.5 05/05/2021   BP Readings from Last 3 Encounters:  08/25/22 106/70  07/09/22 (!) 115/48  06/04/22 122/60    ASSESSMENT AND PLAN:  Discussed the following assessment and plan:  Tremor - Plan: TSH, CBC with Differential/Platelet, Basic metabolic panel, T4, free  Medication management - Plan: TSH, CBC with Differential/Platelet, Basic metabolic panel, T4, free  Hereditary and idiopathic peripheral neuropathy  Decreased exercise tolerance - no change  Advanced age [age] Sx seem to be an intermittent intention tremor r hand but no falling or new imablance  Bp is stable and seems to handle the walker well  although if needed ofr long mobility  since in location add thyroid bmo( hx of low sodium) and fu cbc after rx for gi bleeding. Uncertain if gaba could be a problem but she states has been steady and helpful for a while . She has a long walk to the  dining  area  and agree that  should be able to get assistance with motor scooter  when needed. To relieve any barriers  on a given day for nutritional needs . She wanted to wait on this and could contact  us for orders.  Will communicate to son POA  about this also   -Patient advised to return or notify health care team  if  new concerns arise.  Patient Instructions  Bp is ok 120/70 range . Exam is stable  Can let us know if  want to proceed with  ordering a motorized scooter.  Lab today anemia fu thyroid  and go from there .    Neta Mends. Dajuana Palen M.D.

## 2022-08-25 NOTE — Progress Notes (Signed)
Anemia is better , sodium level and kidney function is better . Thyroid tests are normal   All reasrruing  but No explanation for the tremor . Will follow   let us know  if we need to help in any way . Or if worsening

## 2022-09-08 DIAGNOSIS — Z8711 Personal history of peptic ulcer disease: Secondary | ICD-10-CM | POA: Diagnosis not present

## 2022-09-08 DIAGNOSIS — D500 Iron deficiency anemia secondary to blood loss (chronic): Secondary | ICD-10-CM | POA: Diagnosis not present

## 2022-09-28 NOTE — Progress Notes (Unsigned)
No chief complaint on file.   HPI: Susan Ford 89 y.o. come in for  ROS: See pertinent positives and negatives per HPI.  Past Medical History:  Diagnosis Date   Arthritis    Cataract 2007   bilateral cat ext    Cholecystitis    Complication of anesthesia Feb 21, 2014   atrial fib after cholecystectomy, lasted a few hours, none since   Dysrhythmia 02-21-2014   after cholecystectomy had atrial fib for a few hours, none since   GERD (gastroesophageal reflux disease)    Had endoscopy no history of Barrett's   Hx of nonmelanoma skin cancer    Unspecified hereditary and idiopathic peripheral neuropathy     Family History  Problem Relation Age of Onset   Neurodegenerative disease Mother        Shy Drager died at 29   Alcohol abuse Other    Heart disease Other     Social History   Socioeconomic History   Marital status: Widowed    Spouse name: Not on file   Number of children: 3   Years of education: 15 years   Highest education level: Associate degree: occupational, Scientist, product/process development, or vocational program  Occupational History   Occupation: Retired    Comment: Charity fundraiser  Tobacco Use   Smoking status: Former    Packs/day: 0.25    Years: 7.00    Additional pack years: 0.00    Total pack years: 1.75    Types: Cigarettes    Quit date: 04/27/1963    Years since quitting: 59.4   Smokeless tobacco: Never  Substance and Sexual Activity   Alcohol use: Yes    Alcohol/week: 7.0 standard drinks of alcohol    Types: 7 Glasses of wine per week    Comment: 1 glass of wine daily   Drug use: No    Types: Hydrocodone   Sexual activity: Yes  Other Topics Concern   Not on file  Social History Narrative   Retired Charity fundraiser    widowed since    Spring  15    [facility] of [facility]   Regular exercise former smoker   G3P3   From [place] originally      05/12/2018:   Lives alone on ranch house, but has stairs to garage with "acorn chair" lift    Son in [place], and two daughters out of state, all of  whom are supportive    Worked in NICU, 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          Social Determinants of Health   Financial Resource Strain: Low Risk  (06/04/2022)   Overall Financial Resource Strain (CARDIA)    Difficulty of Paying Living Expenses: Not hard at all  Food Insecurity: No Food Insecurity (06/04/2022)   Hunger Vital Sign    Worried About Running Out of Food in the Last Year: Never true    Ran Out of Food in the Last Year: Never true  Transportation Needs: No Transportation Needs (06/04/2022)   PRAPARE - Administrator, Civil Service (Medical): No    Lack of Transportation (Non-Medical): No  Physical Activity: Inactive (06/04/2022)   Exercise Vital Sign    Days of Exercise per Week: 0 days    Minutes of Exercise per Session: 0 min  Stress: No Stress Concern Present (06/04/2022)   [employer] of Occupational Health - Occupational Stress Questionnaire    Feeling of Stress : Not at  all  Social Connections: Moderately Integrated (06/04/2022)   Social Connection and Isolation Panel [NHANES]    Frequency of Communication with Friends and Family: More than three times a week    Frequency of Social Gatherings with Friends and Family: More than three times a week    Attends Religious Services: More than 4 times per year    Active Member of [employer] or Organizations: Yes    Attends Banker Meetings: More than 4 times per year    Marital Status: Widowed    Outpatient Medications Prior to Visit  Medication Sig Dispense Refill   acetaminophen (TYLENOL) 500 MG tablet Take 500 mg by mouth every 6 (six) hours as needed for mild pain.      Apoaequorin (PREVAGEN) 10 MG CAPS Take 1 capsule by mouth daily.     estradiol (CLIMARA - DOSED IN MG/24 HR) 0.025 mg/24hr patch APPLY ONCE WEEKLY ON SATURDAY 12 patch 0   gabapentin (NEURONTIN) 600 MG tablet TAKE 1 TABLET BY MOUTH FOUR TIMES A DAY 360 tablet 1   Multiple  Vitamin (MULTIVITAMIN) tablet Take 1 tablet by mouth daily after lunch.     pantoprazole (PROTONIX) 40 MG tablet Take 1 tablet twice a day for 8 weeks then 1 tablet daily (Patient not taking: Reported on 08/25/2022) 60 tablet 2   simvastatin (ZOCOR) 10 MG tablet TAKE 1 TABLET BY MOUTH EVERYDAY AT BEDTIME 90 tablet 1   No facility-administered medications prior to visit.     EXAM:  There were no vitals taken for this visit.  There is no height or weight on file to calculate BMI.  GENERAL: vitals reviewed and listed above, alert, oriented, appears well hydrated and in no acute distress HEENT: atraumatic, conjunctiva  clear, no obvious abnormalities on inspection of external nose and ears OP : no lesion edema or exudate  NECK: no obvious masses on inspection palpation  LUNGS: clear to auscultation bilaterally, no wheezes, rales or rhonchi, good air movement CV: HRRR, no clubbing cyanosis or  peripheral edema nl cap refill  MS: moves all extremities without noticeable focal  abnormality PSYCH: pleasant and cooperative, no obvious depression or anxiety Lab Results  Component Value Date   WBC 7.2 08/25/2022   HGB 12.3 08/25/2022   HCT 35.7 (L) 08/25/2022   PLT 259.0 08/25/2022   GLUCOSE 97 08/25/2022   CHOL 150 05/05/2021   TRIG 191.0 (H) 05/05/2021   HDL 64.30 05/05/2021   LDLDIRECT 153 02/12/2009   LDLCALC 48 05/05/2021   ALT 14 07/08/2022   AST 21 07/08/2022   NA 134 (L) 08/25/2022   K 4.3 08/25/2022   CL 103 08/25/2022   CREATININE 0.65 08/25/2022   BUN 11 08/25/2022   CO2 22 08/25/2022   TSH 0.70 08/25/2022   INR 0.9 07/09/2022   HGBA1C 5.5 05/05/2021   BP Readings from Last 3 Encounters:  08/25/22 106/70  07/09/22 (!) 115/48  06/04/22 122/60    ASSESSMENT AND PLAN:  Discussed the following assessment and plan:  No diagnosis found.  -Patient advised to return or notify health care team  if  new concerns arise.  There are no Patient Instructions on file for this  visit.   Neta Mends. Lyllian Gause M.D.

## 2022-09-29 ENCOUNTER — Encounter: Payer: Self-pay | Admitting: Internal Medicine

## 2022-09-29 ENCOUNTER — Ambulatory Visit (INDEPENDENT_AMBULATORY_CARE_PROVIDER_SITE_OTHER): Payer: Medicare Other | Admitting: Internal Medicine

## 2022-09-29 VITALS — BP 128/80 | HR 79 | Temp 97.6°F | Wt 126.6 lb

## 2022-09-29 DIAGNOSIS — R6889 Other general symptoms and signs: Secondary | ICD-10-CM

## 2022-09-29 DIAGNOSIS — G609 Hereditary and idiopathic neuropathy, unspecified: Secondary | ICD-10-CM | POA: Diagnosis not present

## 2022-09-29 DIAGNOSIS — R251 Tremor, unspecified: Secondary | ICD-10-CM | POA: Diagnosis not present

## 2022-09-29 DIAGNOSIS — R54 Age-related physical debility: Secondary | ICD-10-CM | POA: Diagnosis not present

## 2022-09-29 DIAGNOSIS — Z79899 Other long term (current) drug therapy: Secondary | ICD-10-CM | POA: Diagnosis not present

## 2022-09-29 MED ORDER — GABAPENTIN 300 MG PO CAPS: ORAL_CAPSULE | ORAL | 3 refills | Status: DC

## 2022-09-29 NOTE — Patient Instructions (Addendum)
I suggest we try weaning the gabapentin dosing   to a lower level in case could be contributing to the jitteriness and how you are feeling.   Decrease to gabapentin to 300mg     600   300  600  dosing  daily for a week   then decrease to 300 mg dosing  4 x per day .   If feeling worse when weaning then we can go slower and call contact us for more advice  I would like a follow up in 1-2 months about how feeling .

## 2022-11-02 ENCOUNTER — Ambulatory Visit: Payer: Medicare Other | Admitting: Internal Medicine

## 2022-11-02 NOTE — Progress Notes (Unsigned)
No chief complaint on file.   HPI: Susan Ford 89 y.o. come in for Chronic disease management   Working on dec gabapentintincase causeig se  of jitteriness and  other   ROS: See pertinent positives and negatives per HPI.  Past Medical History:  Diagnosis Date   Arthritis    Cataract 2007   bilateral cat ext    Cholecystitis    Complication of anesthesia Feb 21, 2014   atrial fib after cholecystectomy, lasted a few hours, none since   Dysrhythmia 02-21-2014   after cholecystectomy had atrial fib for a few hours, none since   GERD (gastroesophageal reflux disease)    Had endoscopy no history of Barrett's   Hx of nonmelanoma skin cancer    Unspecified hereditary and idiopathic peripheral neuropathy     Family History  Problem Relation Age of Onset   Neurodegenerative disease Mother        Shy Drager died at 21   Alcohol abuse Other    Heart disease Other     Social History   Socioeconomic History   Marital status: Widowed    Spouse name: Not on file   Number of children: 3   Years of education: 15 years   Highest education level: Associate degree: occupational, Scientist, product/process development, or vocational program  Occupational History   Occupation: Retired    Comment: Charity fundraiser  Tobacco Use   Smoking status: Former    Packs/day: 0.25    Years: 7.00    Additional pack years: 0.00    Total pack years: 1.75    Types: Cigarettes    Quit date: 04/27/1963    Years since quitting: 59.5   Smokeless tobacco: Never  Substance and Sexual Activity   Alcohol use: Yes    Alcohol/week: 7.0 standard drinks of alcohol    Types: 7 Glasses of wine per week    Comment: 1 glass of wine daily   Drug use: No    Types: Hydrocodone   Sexual activity: Yes  Other Topics Concern   Not on file  Social History Narrative   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          Social Determinants of Health   Financial Resource Strain: Low Risk  (06/04/2022)   Overall Financial Resource Strain (CARDIA)    Difficulty of Paying Living Expenses: Not hard at all  Food Insecurity: No Food Insecurity (06/04/2022)   Hunger Vital Sign    Worried About Running Out of Food in the Last Year: Never true    Ran Out of Food in the Last Year: Never true  Transportation Needs: No Transportation Needs (06/04/2022)   PRAPARE - Administrator, Civil Service (Medical): No    Lack of Transportation (Non-Medical): No  Physical Activity: Inactive (06/04/2022)   Exercise Vital Sign    Days of Exercise per Week: 0 days    Minutes of Exercise per Session: 0 min  Stress: No Stress Concern Present (06/04/2022)  [employer] of Occupational Health - Occupational Stress Questionnaire    Feeling of Stress : Not at all  Social Connections: Moderately Integrated (06/04/2022)   Social Connection and Isolation Panel [NHANES]    Frequency of Communication with Friends and Family: More than three times a week    Frequency of Social Gatherings with Friends and Family: More than three times a week    Attends Religious Services: More than 4 times per year    Active Member of [employer] or Organizations: Yes    Attends Banker Meetings: More than 4 times per year    Marital Status: Widowed    Outpatient Medications Prior to Visit  Medication Sig Dispense Refill   acetaminophen (TYLENOL) 500 MG tablet Take 500 mg by mouth every 6 (six) hours as needed for mild pain.      Apoaequorin (PREVAGEN) 10 MG CAPS Take 1 capsule by mouth daily.     estradiol (CLIMARA - DOSED IN MG/24 HR) 0.025 mg/24hr patch APPLY ONCE WEEKLY ON SATURDAY 12 patch 0    gabapentin (NEURONTIN) 300 MG capsule Take 300 mg twice  day alternating  with 600 mg tabs for a week  or as directed .then 300 mg 4 x per day . 120 capsule 3   gabapentin (NEURONTIN) 600 MG tablet TAKE 1 TABLET BY MOUTH FOUR TIMES A DAY 360 tablet 1   Multiple Vitamin (MULTIVITAMIN) tablet Take 1 tablet by mouth daily after lunch.     simvastatin (ZOCOR) 10 MG tablet TAKE 1 TABLET BY MOUTH EVERYDAY AT BEDTIME 90 tablet 1   No facility-administered medications prior to visit.     EXAM:  There were no vitals taken for this visit.  There is no height or weight on file to calculate BMI.  GENERAL: vitals reviewed and listed above, alert, oriented, appears well hydrated and in no acute distress HEENT: atraumatic, conjunctiva  clear, no obvious abnormalities on inspection of external nose and ears OP : no lesion edema or exudate  NECK: no obvious masses on inspection palpation  LUNGS: clear to auscultation bilaterally, no wheezes, rales or rhonchi, good air movement CV: HRRR, no clubbing cyanosis or  peripheral edema nl cap refill  MS: moves all extremities without noticeable focal  abnormality PSYCH: pleasant and cooperative, no obvious depression or anxiety Lab Results  Component Value Date   WBC 7.2 08/25/2022   HGB 12.3 08/25/2022   HCT 35.7 (L) 08/25/2022   PLT 259.0 08/25/2022   GLUCOSE 97 08/25/2022   CHOL 150 05/05/2021   TRIG 191.0 (H) 05/05/2021   HDL 64.30 05/05/2021   LDLDIRECT 153 02/12/2009   LDLCALC 48 05/05/2021   ALT 14 07/08/2022   AST 21 07/08/2022   NA 134 (L) 08/25/2022   K 4.3 08/25/2022   CL 103 08/25/2022   CREATININE 0.65 08/25/2022   BUN 11 08/25/2022   CO2 22 08/25/2022   TSH 0.70 08/25/2022   INR 0.9 07/09/2022   HGBA1C 5.5 05/05/2021   BP Readings from Last 3 Encounters:  09/29/22 128/80  08/25/22 106/70  07/09/22 (!) 115/48    ASSESSMENT AND PLAN:  Discussed the following assessment and plan:  No diagnosis found.  -Patient advised to  return or notify health care team  if  new concerns arise.  There are no Patient Instructions on file for this visit.   Neta Mends. Matson Welch M.D.

## 2022-11-03 ENCOUNTER — Encounter: Payer: Self-pay | Admitting: Internal Medicine

## 2022-11-03 ENCOUNTER — Ambulatory Visit (INDEPENDENT_AMBULATORY_CARE_PROVIDER_SITE_OTHER): Payer: Medicare Other | Admitting: Internal Medicine

## 2022-11-03 VITALS — BP 118/70 | HR 110 | Temp 98.3°F | Ht 64.0 in | Wt 123.5 lb

## 2022-11-03 DIAGNOSIS — T887XXA Unspecified adverse effect of drug or medicament, initial encounter: Secondary | ICD-10-CM

## 2022-11-03 DIAGNOSIS — G609 Hereditary and idiopathic neuropathy, unspecified: Secondary | ICD-10-CM | POA: Diagnosis not present

## 2022-11-03 DIAGNOSIS — Z79899 Other long term (current) drug therapy: Secondary | ICD-10-CM | POA: Diagnosis not present

## 2022-11-03 DIAGNOSIS — R251 Tremor, unspecified: Secondary | ICD-10-CM

## 2022-11-03 MED ORDER — GABAPENTIN 300 MG PO CAPS: 300.0000 mg | ORAL_CAPSULE | Freq: Four times a day (QID) | ORAL | 3 refills | Status: DC

## 2022-11-03 NOTE — Patient Instructions (Addendum)
Glad you are doing better   Stay on same dose  gabapentin 300  4 x per day .   Plan fu in 3 months and to decide on  dosing .

## 2022-12-04 ENCOUNTER — Other Ambulatory Visit: Payer: Self-pay | Admitting: Family

## 2022-12-15 ENCOUNTER — Ambulatory Visit (INDEPENDENT_AMBULATORY_CARE_PROVIDER_SITE_OTHER): Payer: Medicare Other | Admitting: Neurology

## 2022-12-15 ENCOUNTER — Encounter: Payer: Self-pay | Admitting: Neurology

## 2022-12-15 VITALS — BP 135/88 | HR 113 | Ht 64.0 in | Wt 126.0 lb

## 2022-12-15 DIAGNOSIS — F02B0 Dementia in other diseases classified elsewhere, moderate, without behavioral disturbance, psychotic disturbance, mood disturbance, and anxiety: Secondary | ICD-10-CM

## 2022-12-15 DIAGNOSIS — G301 Alzheimer's disease with late onset: Secondary | ICD-10-CM

## 2022-12-15 DIAGNOSIS — R269 Unspecified abnormalities of gait and mobility: Secondary | ICD-10-CM

## 2022-12-15 NOTE — Progress Notes (Signed)
Patient: Susan Ford Date of Birth: 1929/09/02  Reason for Visit: Follow up for memory History from: Patient, alone Primary Neurologist: Terrace Arabia  ASSESSMENT AND PLAN 89 y.o. year old female   Chronic low back pain, lumbar radiculopathy, gait abnormality, bilateral foot drop -Has not been able to tolerate Cymbalta, Lyrica, nortriptyline, Lamictal (unknown why) -Remains on gabapentin, PCP has reduced the dose down to 300 mg 4 times daily due to side effect of tremor and jittery feeling, reportedly doing well, back pain under good control  2.  Dementia -MoCA 15/30 (20/30 Sept 2023), remains in independent living, appears some decline, but continues with own ADLs -Had no benefit with Exelon, Aricept resulted in dreams, rash with Namenda  -Recommend against driving -Encouraged exercise, brain stimulating activities, healthy eating, drinking plenty of water for brain health -MRI of the brain in 2019 showed moderate atrophy, moderate supratentorium small vessel disease -Can continue close follow-up with PCP, return here for new symptoms  HISTORY  Susan Ford is a 89 year-old right-handed white female,She was a patient of Dr. Sandria Manly, last visit was May 2014    She had a history of lumbar decompression surgery, presenting with chronic low back pain, mild gait difficulty due to pain.   MRI of the lumbar spine 12/25/2010 shows diffuse spondylitic changes and progression of degenerative changes at L3-4 and L4-5 and modest broad disc bulge at L5-S1. There is evidence of neuroforaminal  stenosis on the left at L4-5. She has pain in her feet and aching from her knees down.   EMG/NCV 12/24/2011 showing progression of peripheral neuropathy versus a prior study 09/13/2011. There was also a  mild overlying acute L5 radiculopathy on the left. She underwent  surgical decompression at L4-5 and L5-S1 with bilateral foraminotomies and microdiscectomy on the left at L4-5 02/01/2012 by Dr. Danielle Dess, Surgery  helped her.  She still complains of pain at low back pain, waist line, going down her leg, at both leg.   She denies bowel/bladder incontinence    She has difficulty walking, because of low back pain, worst pain in the morning, she usually take her hydrocodone early morning, then napping for a while, then she can function again.   Previously she has tried Lyrica which caused abdominal cramps,  Cymbalta, caused nausea. She had side effects from nortriptyline 20 mg per night. She is on gabapentin 600, 1-1/2  tablets 4 times per day and complains of memory loss on the medication and now questions any improvement in pain. She uses a cane, but not in her house.    She underwent an epidural steroid injection 10/2011 with improvement in her back pain.    She is independent in activities of daily living including driving a car, cooking,cleaning, shopping, and doing laundry.     UPDATE Sept 26 2022: She lives alone, she is still driving, today her main concern is increased bilateral lower extremity weakness, even after standing or walking short distance, she would feel bilateral lower extremity heaviness, weak, but once she sits down, or lying down, she is not in pain, she denies bowel and bladder incontinence, she does has worsening gait abnormality   Recently was seen by Dr. Murray Hodgkins, had low back injection without helping her,  She is also concerned about slow worsening memory loss, she is a retired Charity fundraiser, today's MoCA examination is 18 out of 30,  I was able to talk with her son Casimiro Needle by calling his cell phone, relayed above medical information, patient likely need  more supervision because of worsening gait abnormality, and memory loss  Update October 21, 2021 SS: At last visit B12 was elevated 1616 from supplement, TSH normal 1.150. Today, MOCA 23/30. Living at [facility], moved 1 week ago, in her home, was getting difficult to manage a home. Using a walker or cane now, pain to low back.  Has to make notes, she drives some, facility brought her. Manages her finances, bills. Stopped Aricept, gave her nightmares. Thinks she had side effect from Lamictal, doesn't remember exactly. Takes gabapentin 600 mg 4 times daily. Wants another memory medication.  Update January 19, 2022 SS: MOCA is 20/30. Still living at assisted living. A friend brought her today. Has not noticed any change with the Exelon, wants to stop it. Trouble with short term. Does drive herself to her appointments. No falls, no new neuro symptoms. Has good appetite, weight is stable. Using walker today. Doesn't do much exercise, hurts her back.  Update December 15, 2022 SS: [facility] 15/30 today. Still at independent living, facility brought her. Very little driving. Takes Prev[employer] for [employer]. Manages medications. Using walker, no falls. Taking gabapentin for back pain, pcp was reducing due to tremor, jittery. Claims side effect resolved. No new issues.   REVIEW OF SYSTEMS: Out of a complete 14 system review of symptoms, the patient complains only of the following symptoms, and all other reviewed systems are negative.  See HPI  ALLERGIES: Allergies  Allergen Reactions   Keflex [Cephalexin]     Dizziness   Lyrica [Pregabalin] Other (See Comments)    "High Fever"    Oxytetracycline Other (See Comments)    "High Fever" caused by terramycin  Other Reaction(s): high fever   Memantine Rash    HOME MEDICATIONS: Outpatient Medications Prior to Visit  Medication Sig Dispense Refill   acetaminophen (TYLENOL) 500 MG tablet Take 500 mg by mouth every 6 (six) hours as needed for mild pain.      Apoaequorin (PREVAGEN) 10 MG CAPS Take 1 capsule by mouth daily.     estradiol (CLIMARA - DOSED IN MG/24 HR) 0.025 mg/24hr patch APPLY ONCE WEEKLY ON SATURDAY 12 patch 0   gabapentin (NEURONTIN) 300 MG capsule Take 1 capsule (300 mg total) by mouth 4 (four) times daily. 120 capsule 3   Multiple Vitamin (MULTIVITAMIN) tablet Take 1  tablet by mouth daily after lunch.     simvastatin (ZOCOR) 10 MG tablet TAKE 1 TABLET BY MOUTH EVERYDAY AT BEDTIME 90 tablet 1   No facility-administered medications prior to visit.    PAST MEDICAL HISTORY: Past Medical History:  Diagnosis Date   Arthritis    Cataract 2007   bilateral cat ext    Cholecystitis    Complication of anesthesia Feb 21, 2014   atrial fib after cholecystectomy, lasted a few hours, none since   Dysrhythmia 02-21-2014   after cholecystectomy had atrial fib for a few hours, none since   GERD (gastroesophageal reflux disease)    Had endoscopy no history of Barrett's   Hx of nonmelanoma skin cancer    Unspecified hereditary and idiopathic peripheral neuropathy     PAST SURGICAL HISTORY: Past Surgical History:  Procedure Laterality Date   ABDOMINAL HYSTERECTOMY  15 yrs ago   complete   BIOPSY  07/09/2022   Procedure: BIOPSY;  Surgeon: Kathi Der, MD;  Location: [facility] ENDOSCOPY;  Service: Gastroenterology;;   BREAST CYST ASPIRATION     CHOLECYSTECTOMY N/A 02/21/2014   Procedure: LAPAROSCOPIC CHOLECYSTECTOMY WITH INTRAOPERATIVE CHOLANGIOGRAM;  Surgeon:  Frederik Schmidt, MD;  Location[facility] OR;  Service: General;  Laterality: N/A;   ENDOSCOPIC RETROGRADE CHOLANGIOPANCREATOGRAPHY (ERCP) WITH PROPOFOL N/A 05/29/2014   Procedure: ENDOSCOPIC RETROGRADE CHOLANGIOPANCREATOGRAPHY (ERCP) WITH PROPOFOL;  Surgeon: Willis Modena, MD;  Location: [facility];  Service: Endoscopy;  Laterality: N/A;   ERCP N/A 02/24/2014   Procedure: ENDOSCOPIC RETROGRADE CHOLANGIOPANCREATOGRAPHY (ERCP);  Surgeon: Willis Modena, MD;  Location: [facility] ENDOSCOPY;  Service: Endoscopy;  Laterality: N/A;   ESOPHAGOGASTRODUODENOSCOPY (EGD) WITH PROPOFOL N/A 07/09/2022   Procedure: ESOPHAGOGASTRODUODENOSCOPY (EGD) WITH PROPOFOL;  Surgeon: Kathi Der, MD;  Location: [facility];  Service: Gastroenterology;  Laterality: N/A;   EYE SURGERY Bilateral 2007   both eyes cataracts with lens replacments    LUMBAR LAMINECTOMY/DECOMPRESSION MICRODISCECTOMY  02/01/2012   Procedure: LUMBAR LAMINECTOMY/DECOMPRESSION MICRODISCECTOMY 2 LEVELS;  Surgeon: Barnett Abu, MD;  Location: [facility]URO ORS;  Service: Neurosurgery;  Laterality: Left;  Left Lumbar Four-Five Lumbar Five-Sacral One Laminectomies/Foraminotomies/Microscope   SPYGLASS CHOLANGIOSCOPY N/A 05/29/2014   Procedure: SPYGLASS CHOLANGIOSCOPY;  Surgeon: Willis Modena, MD;  Location: [facility];  Service: Endoscopy;  Laterality: N/A;   TONSILLECTOMY  age 58    FAMILY HISTORY: Family History  Problem Relation Age of Onset   Neurodegenerative disease Mother        Shy Drager died at 3   Alcohol abuse Other    Heart disease Other     SOCIAL HISTORY: Social History   Socioeconomic History   Marital status: Widowed    Spouse name: Not on file   Number of children: 3   Years of education: 15 years   Highest education level: Associate degree: occupational, Scientist, product/process development, or vocational program  Occupational History   Occupation: Retired    Comment: Charity fundraiser  Tobacco Use   Smoking status: Former    Current packs/day: 0.00    Average packs/day: 0.3 packs/day for 7.0 years (1.8 ttl pk-yrs)    Types: Cigarettes    Start date: 04/26/1956    Quit date: 04/27/1963    Years since quitting: 59.6   Smokeless tobacco: Never  Substance and Sexual Activity   Alcohol use: Yes    Alcohol/week: 7.0 standard drinks of alcohol    Types: 7 Glasses of wine per week    Comment: 1 glass of wine daily   Drug use: No    Types: Hydrocodone   Sexual activity: Yes  Other Topics Concern   Not on file  Social History Narrative   Retired Charity fundraiser    widowed since    Spring  15    [facility] of 1   Regular exercise former smoker   G3P3   From [place] originally      05/12/2018:   Lives alone on ranch house, but has stairs to garage with "acorn chair" lift    Son in [place], and two daughters out of state, all of whom are supportive    Worked in NICU, 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          Social Determinants of Health   Financial Resource Strain: Low Risk  (06/04/2022)   Overall Financial Resource Strain (CARDIA)    Difficulty of Paying Living Expenses: Not hard at all  Food Insecurity: No Food Insecurity (06/04/2022)   Hunger Vital Sign    Worried About Running Out of Food in the Last Year: Never true    Ran Out of Food in the Last Year: Never true  Transportation Needs: No Transportation Needs (06/04/2022)   PRAPARE -  Administrator, Civil Service (Medical): No    Lack of Transportation (Non-Medical): No  Physical Activity: Inactive (06/04/2022)   Exercise Vital Sign    Days of Exercise per Week: 0 days    Minutes of Exercise per Session: 0 min  Stress: No Stress Concern Present (06/04/2022)   [employer] of Occupational Health - Occupational Stress Questionnaire    Feeling of Stress : Not at all  Social Connections: Moderately Integrated (06/04/2022)   Social Connection and Isolation Panel [NHANES]    Frequency of Communication with Friends and Family: More than three times a week    Frequency of Social Gatherings with Friends and Family: More than three times a week    Attends Religious Services: More than 4 times per year    Active Member of [employer] or Organizations: Yes    Attends Banker Meetings: More than 4 times per year    Marital Status: Widowed  Intimate Partner Violence: Not At Risk (06/04/2022)   Humiliation, Afraid, Rape, and Kick questionnaire    Fear of Current or Ex-Partner: No    Emotionally Abused: No    Physically Abused: No    Sexually Abused: No   PHYSICAL EXAM  Vitals:   12/15/22 1251  BP: 135/88  Pulse: (!) 113  Weight: 126 lb (57.2 kg)  Height: 5\' 4"  (1.626 m)     Body mass index is 21.63 kg/m.    12/15/2022   12:54 PM 01/19/2022    1:50 PM 10/21/2021   10:26 AM 01/19/2021    1:00 PM  Montreal Cognitive Assessment   Visuospatial/  Executive (0/5) 2 3 3 2   Naming (0/3) 2 2 2 2   Attention: Read list of digits (0/2) 1 1 2 2   Attention: Read list of letters (0/1) 1 1 0 1  Attention: Serial 7 subtraction starting at 100 (0/3) 2 3 3 1   Language: Repeat phrase (0/2) 1 1 2 2   Language : Fluency (0/1) 0 1 0 0  Abstraction (0/2) 1 2 2 2   Delayed Recall (0/5) 0 0 3 0  Orientation (0/6) 5 6 6 6   Total 15 20 23 18    Generalized: Well developed, in no acute distress, well-dressed and appearing Neurological examination  Mentation: Alert oriented to time, place, history taking. Follows all commands speech and language fluent Cranial nerve II-XII: Pupils were equal round reactive to light. Extraocular movements were full, visual field were full on confrontational test. Facial sensation and strength were normal. Head turning and shoulder shrug  were normal and symmetric. Motor: No significant muscle weakness noted Sensory: Sensory testing is intact to soft touch on all 4 extremities. No evidence of extinction is noted.  Coordination: Cerebellar testing reveals good finger-nose-finger and heel-to-shin bilaterally.  Gait and station: Gait is wide-based, cautious, uses rolling walker, bilateral foot drop, wearing small heels Reflexes: Deep tendon reflexes are symmetric and normal bilaterally.   DIAGNOSTIC DATA (LABS, IMAGING, TESTING) - I reviewed patient records, labs, notes, testing and imaging myself where available.  Lab Results  Component Value Date   WBC 7.2 08/25/2022   HGB 12.3 08/25/2022   HCT 35.7 (L) 08/25/2022   MCV 93.5 08/25/2022   PLT 259.0 08/25/2022      Component Value Date/Time   NA 134 (L) 08/25/2022 1241   NA 133 (A) 07/16/2016 0000   K 4.3 08/25/2022 1241   CL 103 08/25/2022 1241   CO2 22 08/25/2022 1241   GLUCOSE 97 08/25/2022 1241  BUN 11 08/25/2022 1241   BUN 16 07/16/2016 0000   CREATININE 0.65 08/25/2022 1241   CREATININE 0.62 02/06/2020 1140   CALCIUM 8.5 08/25/2022 1241   PROT 6.9  07/08/2022 1200   ALBUMIN 3.8 07/08/2022 1200   AST 21 07/08/2022 1200   ALT 14 07/08/2022 1200   ALKPHOS 58 07/08/2022 1200   BILITOT 1.0 07/08/2022 1200   GFRNONAA >60 07/09/2022 0657   GFRNONAA 81 02/06/2020 1140   GFRAA 93 02/06/2020 1140   Lab Results  Component Value Date   CHOL 150 05/05/2021   HDL 64.30 05/05/2021   LDLCALC 48 05/05/2021   LDLDIRECT 153 02/12/2009   TRIG 191.0 (H) 05/05/2021   CHOLHDL 2 05/05/2021   Lab Results  Component Value Date   HGBA1C 5.5 05/05/2021   Lab Results  Component Value Date   VITAMINB12 1,616 (H) 01/19/2021   Lab Results  Component Value Date   TSH 0.70 08/25/2022   Margie Ege, AGNP-C, DNP 12/15/2022, 1:33 PM [facility] [address], [address] [place], [place] [address] [phone]

## 2022-12-15 NOTE — Patient Instructions (Signed)
Doing well on lower dose gabapentin. I recommend against driving. Continue to see your primary care doctor. Recommend exercise, healthy eating, brain stimulating activity.

## 2022-12-20 ENCOUNTER — Telehealth: Payer: Self-pay | Admitting: Internal Medicine

## 2022-12-20 MED ORDER — SIMVASTATIN 10 MG PO TABS: 10.0000 mg | ORAL_TABLET | Freq: Every day | ORAL | 1 refills | Status: DC

## 2022-12-20 NOTE — Telephone Encounter (Signed)
Prescription Request  12/20/2022  LOV: 11/03/2022       simvastatin (ZOCOR) 10 MG tablet  Have you contacted your pharmacy to request a refill? Yes   Which pharmacy would you like this sent to?   [id number] - [place], [place] - [address] Phone: [phone]  Fax: [phone]      Patient notified that their request is being sent to the clinical staff for review and that they should receive a response within 2 business days.   Please advise at [facility] [phone]

## 2022-12-20 NOTE — Telephone Encounter (Signed)
Rx sent 

## 2022-12-28 ENCOUNTER — Other Ambulatory Visit: Payer: Self-pay

## 2022-12-28 ENCOUNTER — Emergency Department (HOSPITAL_COMMUNITY): Payer: Medicare Other

## 2022-12-28 ENCOUNTER — Encounter (HOSPITAL_COMMUNITY): Payer: Self-pay | Admitting: Emergency Medicine

## 2022-12-28 ENCOUNTER — Observation Stay (HOSPITAL_COMMUNITY)
Admission: EM | Admit: 2022-12-28 | Discharge: 2023-01-02 | Disposition: A | Discharge status: Home / Self Care | Payer: Medicare Other | Attending: Internal Medicine | Admitting: Internal Medicine

## 2022-12-28 DIAGNOSIS — I2699 Other pulmonary embolism without acute cor pulmonale: Secondary | ICD-10-CM

## 2022-12-28 DIAGNOSIS — G629 Polyneuropathy, unspecified: Secondary | ICD-10-CM | POA: Diagnosis not present

## 2022-12-28 DIAGNOSIS — R0902 Hypoxemia: Secondary | ICD-10-CM | POA: Diagnosis not present

## 2022-12-28 DIAGNOSIS — E871 Hypo-osmolality and hyponatremia: Secondary | ICD-10-CM | POA: Insufficient documentation

## 2022-12-28 DIAGNOSIS — G301 Alzheimer's disease with late onset: Secondary | ICD-10-CM | POA: Diagnosis not present

## 2022-12-28 DIAGNOSIS — F0280 Dementia in other diseases classified elsewhere without behavioral disturbance: Secondary | ICD-10-CM | POA: Diagnosis not present

## 2022-12-28 DIAGNOSIS — D649 Anemia, unspecified: Secondary | ICD-10-CM | POA: Diagnosis not present

## 2022-12-28 DIAGNOSIS — J984 Other disorders of lung: Secondary | ICD-10-CM | POA: Diagnosis not present

## 2022-12-28 DIAGNOSIS — K5710 Diverticulosis of small intestine without perforation or abscess without bleeding: Secondary | ICD-10-CM | POA: Diagnosis not present

## 2022-12-28 DIAGNOSIS — Z85828 Personal history of other malignant neoplasm of skin: Secondary | ICD-10-CM | POA: Diagnosis not present

## 2022-12-28 DIAGNOSIS — K449 Diaphragmatic hernia without obstruction or gangrene: Secondary | ICD-10-CM | POA: Diagnosis not present

## 2022-12-28 DIAGNOSIS — Z79899 Other long term (current) drug therapy: Secondary | ICD-10-CM | POA: Diagnosis not present

## 2022-12-28 DIAGNOSIS — G309 Alzheimer's disease, unspecified: Secondary | ICD-10-CM | POA: Diagnosis not present

## 2022-12-28 DIAGNOSIS — M6281 Muscle weakness (generalized): Secondary | ICD-10-CM | POA: Insufficient documentation

## 2022-12-28 DIAGNOSIS — F02B0 Dementia in other diseases classified elsewhere, moderate, without behavioral disturbance, psychotic disturbance, mood disturbance, and anxiety: Secondary | ICD-10-CM

## 2022-12-28 DIAGNOSIS — Z87891 Personal history of nicotine dependence: Secondary | ICD-10-CM | POA: Diagnosis not present

## 2022-12-28 DIAGNOSIS — R197 Diarrhea, unspecified: Secondary | ICD-10-CM | POA: Diagnosis not present

## 2022-12-28 DIAGNOSIS — K5730 Diverticulosis of large intestine without perforation or abscess without bleeding: Secondary | ICD-10-CM | POA: Diagnosis not present

## 2022-12-28 DIAGNOSIS — Z23 Encounter for immunization: Secondary | ICD-10-CM | POA: Insufficient documentation

## 2022-12-28 DIAGNOSIS — K439 Ventral hernia without obstruction or gangrene: Secondary | ICD-10-CM | POA: Diagnosis not present

## 2022-12-28 DIAGNOSIS — K5900 Constipation, unspecified: Secondary | ICD-10-CM | POA: Diagnosis not present

## 2022-12-28 DIAGNOSIS — R2681 Unsteadiness on feet: Secondary | ICD-10-CM | POA: Diagnosis not present

## 2022-12-28 DIAGNOSIS — F0390 Unspecified dementia without behavioral disturbance: Secondary | ICD-10-CM | POA: Diagnosis present

## 2022-12-28 DIAGNOSIS — R918 Other nonspecific abnormal finding of lung field: Secondary | ICD-10-CM | POA: Diagnosis not present

## 2022-12-28 HISTORY — DX: Other pulmonary embolism without acute cor pulmonale: I26.99

## 2022-12-28 LAB — CBC WITH DIFFERENTIAL/PLATELET
Abs Immature Granulocytes: 0.02 10*3/uL (ref 0.00–0.07)
Basophils Absolute: 0.1 10*3/uL (ref 0.0–0.1)
Basophils Relative: 1 %
Eosinophils Absolute: 0.3 10*3/uL (ref 0.0–0.5)
Eosinophils Relative: 4 %
HCT: 38.1 % (ref 36.0–46.0)
Hemoglobin: 12.7 g/dL (ref 12.0–15.0)
Immature Granulocytes: 0 %
Lymphocytes Relative: 16 %
Lymphs Abs: 1.1 10*3/uL (ref 0.7–4.0)
MCH: 32 pg (ref 26.0–34.0)
MCHC: 33.3 g/dL (ref 30.0–36.0)
MCV: 96 fL (ref 80.0–100.0)
Monocytes Absolute: 0.4 10*3/uL (ref 0.1–1.0)
Monocytes Relative: 7 %
Neutro Abs: 4.8 10*3/uL (ref 1.7–7.7)
Neutrophils Relative %: 72 %
Platelets: 251 10*3/uL (ref 150–400)
RBC: 3.97 MIL/uL (ref 3.87–5.11)
RDW: 12.3 % (ref 11.5–15.5)
WBC: 6.7 10*3/uL (ref 4.0–10.5)
nRBC: 0 % (ref 0.0–0.2)

## 2022-12-28 LAB — COMPREHENSIVE METABOLIC PANEL
ALT: 15 U/L (ref 0–44)
AST: 22 U/L (ref 15–41)
Albumin: 3.4 g/dL — ABNORMAL LOW (ref 3.5–5.0)
Alkaline Phosphatase: 56 U/L (ref 38–126)
Anion gap: 9 (ref 5–15)
BUN: 12 mg/dL (ref 8–23)
CO2: 24 mmol/L (ref 22–32)
Calcium: 8.8 mg/dL — ABNORMAL LOW (ref 8.9–10.3)
Chloride: 102 mmol/L (ref 98–111)
Creatinine, Ser: 0.66 mg/dL (ref 0.44–1.00)
GFR, Estimated: >60 mL/min (ref >60)
Glucose, Bld: 103 mg/dL — ABNORMAL HIGH (ref 70–99)
Potassium: 4.3 mmol/L (ref 3.5–5.1)
Sodium: 135 mmol/L (ref 135–145)
Total Bilirubin: 0.8 mg/dL (ref 0.3–1.2)
Total Protein: 6.3 g/dL — ABNORMAL LOW (ref 6.5–8.1)

## 2022-12-28 MED ORDER — APIXABAN 5 MG PO TABS: 5.0000 mg | ORAL_TABLET | Freq: Two times a day (BID) | ORAL | Status: DC

## 2022-12-28 MED ORDER — APIXABAN 5 MG PO TABS
10.0000 mg | ORAL_TABLET | Freq: Two times a day (BID) | ORAL | Status: DC
  Administered 2022-12-28 – 2023-01-02 (×10): 10 mg via ORAL
  Filled 2022-12-28 (×10): qty 2

## 2022-12-28 MED ORDER — SODIUM CHLORIDE 0.9 % IV BOLUS
1000.0000 mL | Freq: Once | INTRAVENOUS | Status: AC
  Administered 2022-12-28: 1000 mL via INTRAVENOUS

## 2022-12-28 MED ORDER — SIMVASTATIN 20 MG PO TABS
10.0000 mg | ORAL_TABLET | Freq: Every day | ORAL | Status: DC
  Administered 2022-12-29 – 2023-01-02 (×5): 10 mg via ORAL
  Filled 2022-12-28 (×5): qty 1

## 2022-12-28 MED ORDER — ACETAMINOPHEN 650 MG RE SUPP: 650.0000 mg | Freq: Four times a day (QID) | RECTAL | Status: DC | PRN

## 2022-12-28 MED ORDER — ACETAMINOPHEN 325 MG PO TABS
650.0000 mg | ORAL_TABLET | Freq: Four times a day (QID) | ORAL | Status: DC | PRN
  Administered 2022-12-29: 650 mg via ORAL
  Filled 2022-12-28: qty 2

## 2022-12-28 MED ORDER — INFLUENZA VAC A&B SURF ANT ADJ 0.5 ML IM SUSY
0.5000 mL | PREFILLED_SYRINGE | INTRAMUSCULAR | Status: AC
  Administered 2022-12-29: 0.5 mL via INTRAMUSCULAR
  Filled 2022-12-28: qty 0.5

## 2022-12-28 MED ORDER — IOHEXOL 350 MG/ML SOLN
60.0000 mL | Freq: Once | INTRAVENOUS | Status: AC | PRN
  Administered 2022-12-28: 60 mL via INTRAVENOUS

## 2022-12-28 MED ORDER — SODIUM CHLORIDE 0.9% FLUSH
3.0000 mL | Freq: Two times a day (BID) | INTRAVENOUS | Status: DC
  Administered 2022-12-28 – 2023-01-01 (×8): 3 mL via INTRAVENOUS

## 2022-12-28 MED ORDER — GABAPENTIN 300 MG PO CAPS
300.0000 mg | ORAL_CAPSULE | Freq: Four times a day (QID) | ORAL | Status: DC
  Administered 2022-12-28 – 2023-01-02 (×19): 300 mg via ORAL
  Filled 2022-12-28 (×19): qty 1

## 2022-12-28 MED ORDER — IOHEXOL 300 MG/ML  SOLN
100.0000 mL | Freq: Once | INTRAMUSCULAR | Status: AC | PRN
  Administered 2022-12-28: 80 mL via INTRAVENOUS

## 2022-12-28 NOTE — Progress Notes (Signed)
ANTICOAGULATION CONSULT NOTE - Initial Consult  Pharmacy Consult for Apixaban Indication: pulmonary embolus  Allergies  Allergen Reactions   Keflex [Cephalexin]     Dizziness   Lyrica [Pregabalin] Other (See Comments)    "High Fever"    Oxytetracycline Other (See Comments)    "High Fever" caused by terramycin  Other Reaction(s): high fever   Memantine Rash    Patient Measurements:   Heparin Dosing Weight:   Vital Signs: Temp: 97.7 F (36.5 C) (09/03 1548) Temp Source: Oral (09/03 1548) BP: 137/65 (09/03 1721) Pulse Rate: 62 (09/03 1721)  Labs: Recent Labs    12/28/22 1115  HGB 12.7  HCT 38.1  PLT 251  CREATININE 0.66    Estimated Creatinine Clearance: 39.6 mL/min (by C-G formula based on SCr of 0.66 mg/dL).   Medical History: Past Medical History:  Diagnosis Date   Arthritis    Cataract 2007   bilateral cat ext    Cholecystitis    Complication of anesthesia Feb 21, 2014   atrial fib after cholecystectomy, lasted a few hours, none since   Dysrhythmia 02-21-2014   after cholecystectomy had atrial fib for a few hours, none since   GERD (gastroesophageal reflux disease)    Had endoscopy no history of Barrett's   Hx of nonmelanoma skin cancer    Unspecified hereditary and idiopathic peripheral neuropathy     Medications:  Scheduled:   gabapentin  300 mg Oral QID   [START ON 12/29/2022] simvastatin  10 mg Oral Daily   sodium chloride flush  3 mL Intravenous Q12H   Infusions:   Assessment: Susan Ford presents to ED on 9/3 with diarrhea, CT abdomen showed possible PE.  CTA chest confirmed single LLL PE with low clot burden and no heart strain. Pharmacy is consulted to dose apixaban.    Weight 57 kg, SCr 0.66 CBC: Hgb and Plt WNL No bleeding or complications reported.   Goal of Therapy:  Monitor platelets by anticoagulation protocol: Yes   Plan:  Apixaban 10mg  PO BID x7 days, then apixaban 5mg  PO BID. Pharmacy to provide education prior to  discharge  Lynann Beaver PharmD, BCPS [facility] main pharmacy [phone] 12/28/2022 5:40 PM

## 2022-12-28 NOTE — ED Notes (Signed)
Patient has had 8 loose stools since arrival.

## 2022-12-28 NOTE — ED Notes (Signed)
ED TO INPATIENT HANDOFF REPORT  Name/Age/Gender Susan Ford 89 y.o. female  Code Status    Code Status Orders  (From admission, onward)           Start     Ordered   12/28/22 1714  Full code  Continuous       Question:  By:  Answer:  Default: patient does not have capacity for decision making, no surrogate or prior directive available   12/28/22 1714           Code Status History     Date Active Date Inactive Code Status Order ID Comments User Context   07/08/2022 1457 07/09/2022 1946 Full Code [id number]  Maryln Gottron, MD ED   02/21/2014 0507 02/27/2014 1540 Full Code [id number]  Harriette Bouillon, MD ED       Home/SNF/Other   Chief Complaint Pulmonary embolism (HCC) [I26.99]  Level of Care/Admitting Diagnosis ED Disposition     ED Disposition  Admit   Condition  --   Comment  Hospital Area: [facility] [[id number]]  Level of Care: Telemetry [5]  Admit to tele based on following criteria: Monitor QTC interval  May place patient in observation at [facility] or [facility] if equivalent level of care is available:: Yes  Covid Evaluation: Asymptomatic - no recent exposure (last 10 days) testing not required  Diagnosis: Pulmonary embolism [facility]) [[id number]]  Admitting Physician: Briscoe Deutscher [[id number]]  Attending Physician: Briscoe Deutscher [[id number]]          Medical History Past Medical History:  Diagnosis Date   Arthritis    Cataract 2007   bilateral cat ext    Cholecystitis    Complication of anesthesia Feb 21, 2014   atrial fib after cholecystectomy, lasted a few hours, none since   Dysrhythmia 02-21-2014   after cholecystectomy had atrial fib for a few hours, none since   GERD (gastroesophageal reflux disease)    Had endoscopy no history of Barrett's   Hx of nonmelanoma skin cancer    Unspecified hereditary and idiopathic peripheral neuropathy     Allergies Allergies  Allergen Reactions   Keflex [Cephalexin]      Dizziness   Lyrica [Pregabalin] Other (See Comments)    "High Fever"    Oxytetracycline Other (See Comments)    "High Fever" caused by terramycin  Other Reaction(s): high fever   Memantine Rash    IV Location/Drains/Wounds Patient Lines/Drains/Airways Status     Active Line/Drains/Airways     Name Placement date Placement time Site Days   Peripheral IV 12/28/22 20 G Left Antecubital 12/28/22  1110  Antecubital  less than 1   Incision - 4 Ports Abdomen 1: Right;Lower 2: Right;Mid 3: Umbilicus 4: Mid;Upper 02/21/14  --  -- 3232            Labs/Imaging Results for orders placed or performed during the hospital encounter of 12/28/22 (from the past 48 hour(s))  Comprehensive metabolic panel     Status: Abnormal   Collection Time: 12/28/22 11:15 AM  Result Value Ref Range   Sodium 135 135 - 145 mmol/L   Potassium 4.3 3.5 - 5.1 mmol/L   Chloride 102 98 - 111 mmol/L   CO2 24 22 - 32 mmol/L   Glucose, Bld 103 (H) 70 - 99 mg/dL    Comment: Glucose reference range applies only to samples taken after fasting for at least 8 hours.   BUN 12 8 - 23 mg/dL  Creatinine, Ser 0.66 0.44 - 1.00 mg/dL   Calcium 8.8 (L) 8.9 - 10.3 mg/dL   Total Protein 6.3 (L) 6.5 - 8.1 g/dL   Albumin 3.4 (L) 3.5 - 5.0 g/dL   AST 22 15 - 41 U/L   ALT 15 0 - 44 U/L   Alkaline Phosphatase 56 38 - 126 U/L   Total Bilirubin 0.8 0.3 - 1.2 mg/dL   GFR, Estimated >95 >63 mL/min    Comment: (NOTE) Calculated using the CKD-EPI Creatinine Equation (2021)    Anion gap 9 5 - 15    Comment: Performed at [facility], [address], [place], [place] [address]  CBC with Differential     Status: None   Collection Time: 12/28/22 11:15 AM  Result Value Ref Range   WBC 6.7 4.0 - 10.5 K/uL   RBC 3.97 3.87 - 5.11 MIL/uL   Hemoglobin 12.7 12.0 - 15.0 g/dL   HCT 33.2 95.1 - 88.4 %   MCV 96.0 80.0 - 100.0 fL   MCH 32.0 26.0 - 34.0 pg   MCHC 33.3 30.0 - 36.0 g/dL   RDW 16.6 06.3 - 01.6 %    Platelets 251 150 - 400 K/uL   nRBC 0.0 0.0 - 0.2 %   Neutrophils Relative % 72 %   Neutro Abs 4.8 1.7 - 7.7 K/uL   Lymphocytes Relative 16 %   Lymphs Abs 1.1 0.7 - 4.0 K/uL   Monocytes Relative 7 %   Monocytes Absolute 0.4 0.1 - 1.0 K/uL   Eosinophils Relative 4 %   Eosinophils Absolute 0.3 0.0 - 0.5 K/uL   Basophils Relative 1 %   Basophils Absolute 0.1 0.0 - 0.1 K/uL   Immature Granulocytes 0 %   Abs Immature Granulocytes 0.02 0.00 - 0.07 K/uL    Comment: Performed at [facility], [address], [place], [place] [address]   CT Angio Chest PE W and/or Wo Contrast  Result Date: 12/28/2022 CLINICAL DATA:  Pulmonary embolus suspected with high probability. Possible pulmonary emboli in the left lower chest on abdomen and pelvis CT. EXAM: CT ANGIOGRAPHY CHEST WITH CONTRAST TECHNIQUE: Multidetector CT imaging of the chest was performed using the standard protocol during bolus administration of intravenous contrast. Multiplanar CT image reconstructions and MIPs were obtained to evaluate the vascular anatomy. RADIATION DOSE REDUCTION: This exam was performed according to the departmental dose-optimization program which includes automated exposure control, adjustment of the mA and/or kV according to patient size and/or use of iterative reconstruction technique. CONTRAST:  60mL OMNIPAQUE IOHEXOL 350 MG/ML SOLN COMPARISON:  CT abdomen and pelvis 12/28/2022. Chest radiograph 05/05/2021. FINDINGS: Cardiovascular: Positive examination for pulmonary embolus. A focal filling defect is demonstrated in the left lower lobe pulmonary artery. This extends into a branch vessel in the superior segment of the left lower lung. This corresponds to the lesion on prior abdominal CT. No additional pulmonary emboli are demonstrated. This represents very low clot burden. Heart is mildly enlarged with prominence of the left heart. RV to LV ratio is 0.77 suggesting unlikely evidence of right heart strain.  Normal caliber thoracic aorta. Calcification of the aorta. Mediastinum/Nodes: Esophagus is decompressed. Thyroid gland is unremarkable. No significant lymphadenopathy. Scattered calcified lymph nodes consistent with postinflammatory change. 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 Edema or atelectasis would be less likely. No pneumothorax. Bronchial wall thickening consistent with chronic bronchitis. Upper Abdomen: Small esophageal hiatal hernia. Duodenal  diverticulum. Surgical absence of the gallbladder. Musculoskeletal: Degenerative changes in the spine and shoulders. Review of the MIP images confirms the above findings. IMPRESSION: 1. Single pulmonary embolus demonstrated in the left lower lobe pulmonary artery extending into the superior segment left lower lung branch. No evidence of right heart strain. Clot burden is low. 2. Subpleural fibrosis with ground-glass infiltrates in the lung bases may represent chronic fibrosis with active alveolitis. 3. Small esophageal hiatal hernia.  Aortic atherosclerosis. These results were called by telephone at the time of interpretation on 12/28/2022 at 4:08 pm to provider Gerhard Munch , who verbally acknowledged these results. Electronically Signed   By: Burman Nieves M.D.   On: 12/28/2022 16:16   CT ABDOMEN PELVIS W CONTRAST  Result Date: 12/28/2022 CLINICAL DATA:  Left lower quadrant pain with diarrhea. EXAM: CT ABDOMEN AND PELVIS WITH CONTRAST TECHNIQUE: Multidetector CT imaging of the abdomen and pelvis was performed using the standard protocol following bolus administration of intravenous contrast. RADIATION DOSE REDUCTION: This exam was performed according to the departmental dose-optimization program which includes automated exposure control, adjustment of the mA and/or kV according to patient size and/or use of iterative reconstruction technique.  CONTRAST:  80mL OMNIPAQUE IOHEXOL 300 MG/ML  SOLN COMPARISON:  CT 07/08/2022 FINDINGS: Lower chest: There is some bronchial wall thickening identified along lower lobes with some interstitial changes, scarring and fibrotic changes. Some ground-glass as well. No pleural effusion. Heart is enlarged. Coronary artery calcifications are seen. Small hiatal hernia. Question of a nonocclusive filling defect along the left lower lobe pulmonary artery on series 2, image 1 at the edge of the imaging field. A subtle PE is not excluded. Hepatobiliary: Fatty liver infiltration. No space-occupying liver lesion. Patent portal vein. Previous cholecystectomy. Pancreas: Global mild atrophy of the pancreas. Or focal area of volume loss towards the tail. No secondary mass lesion clearly seen. Overall appearance is similar to the prior CT. Spleen: Normal in size without focal abnormality. Adrenals/Urinary Tract: Adrenal glands are unremarkable. Kidneys are normal, without renal calculi, focal lesion, or hydronephrosis. Bladder is unremarkable. Stomach/Bowel: On this non oral contrast exam, the stomach is nondilated. Small bowel is nondilated. Again there is a small hiatal hernia. Significant stool in the rectum with some nodular thickening and some questionable enhancement distally. Please correlate with any findings of a subtle mass lesion. More proximally there are several fluid-filled loops of colon scattered. Appendix is not well seen in the right lower quadrant but no pericecal stranding or fluid. Sigmoid colon diverticulosis. Significant stool in the rectum. Focal hernia involving loops of small bowel identified along the anterolateral left pelvic wall lateral to the rectus abdominis muscle. Vascular/Lymphatic: Aortic atherosclerosis. No enlarged abdominal or pelvic lymph nodes. Reproductive: Status post hysterectomy. No adnexal masses. Other: No free intra-air or free fluid. Musculoskeletal: Moderate degenerative changes of the  spine and pelvis with multilevel posterior osteophytes and disc bulging and associated areas of canal and neural foraminal stenosis. IMPRESSION: Significant stool in the rectum with slight asymmetric wall thickening just above the anorectal junction. Please correlate with any underlying subtle mass lesion. No proximal obstruction or dilatation. Sigmoid colon diverticula. Fatty liver infiltration. Hiatal hernia. Separate anterior pelvic wall hernia left lateral, unchanged from previous, spigelian hernia. This involves small bowel. Question filling defect at the very edge of the imaging field in the left lower lobe pulmonary artery. A pulmonary embolism is not excluded. Dedicated chest CT angiogram could be performed when clinically appropriate. Critical Value/emergent results were called by telephone at  the time of interpretation on 12/28/2022 at 10:16 am to provider Gerhard Munch , who verbally acknowledged these results. Electronically Signed   By: Karen Kays M.D.   On: 12/28/2022 13:24    Pending Labs Unresulted Labs (From admission, onward)     Start     Ordered   12/29/22 0500  Basic metabolic panel  Daily,   R      12/28/22 1714   12/29/22 0500  CBC  Daily,   R      12/28/22 1714            Vitals/Pain Today's Vitals   12/28/22 1131 12/28/22 1407 12/28/22 1530 12/28/22 1548  BP: (!) 148/76  (!) 141/67   Pulse: 69  67   Resp: 18  17   Temp:  98.1 F (36.7 C)  97.7 F (36.5 C)  TempSrc:  Oral  Oral  SpO2: 99%  99%     Isolation Precautions No active isolations  Medications Medications  simvastatin (ZOCOR) tablet 10 mg (has no administration in time range)  gabapentin (NEURONTIN) capsule 300 mg (has no administration in time range)  sodium chloride flush (NS) 0.9 % injection 3 mL (has no administration in time range)  acetaminophen (TYLENOL) tablet 650 mg (has no administration in time range)    Or  acetaminophen (TYLENOL) suppository 650 mg (has no administration in time  range)  iohexol (OMNIPAQUE) 300 MG/ML solution 100 mL (80 mLs Intravenous Contrast Given 12/28/22 1143)  sodium chloride 0.9 % bolus 1,000 mL (0 mLs Intravenous Stopped 12/28/22 1550)  iohexol (OMNIPAQUE) 350 MG/ML injection 60 mL (60 mLs Intravenous Contrast Given 12/28/22 1424)    Mobility walks

## 2022-12-28 NOTE — H&P (Addendum)
History and Physical    DESTINNY GILKEY RUE:[id number] DOB: 04-04-1930 DOA: 12/28/2022  PCP: Madelin Headings, MD   Patient coming from: [facility]   Chief Complaint: Diarrhea   HPI: Susan Ford is a 89 y.o. female with medical history significant for Alzheimer dementia, peripheral neuropathy, and PUD who presents with 1 day of diarrhea.  Patient had reportedly started taking MiraLAX a few days ago for constipation and began having loose stools today.  Patient reports that she was able to manually break up a stool ball in her rectum on her own and has been taking MiraLAX, but then began having loose stools today which have persisted.  She has not noticed any bleeding and denies any abdominal pain.  She denies nausea or vomiting.  She denies chest pain, cough, shortness of breath, or leg swelling.  ED Course: Upon arrival to the ED, patient is found to be afebrile and saturating well on room air with normal heart rate and stable blood pressure.  Labs are notable for normal electrolytes, normal renal function, and normal hemoglobin.  CT of the abdomen and pelvis reveals significant stool in the rectum with slightly asymmetric wall thickening and question of left lower lobe PE.  CTA chest confirmed single LLL PE with low clot burden and no heart strain.  Patient was given a liter of saline and started on IV heparin in the ED.  Review of Systems:  ROS limited by patient's clinical condition.  Past Medical History:  Diagnosis Date   Arthritis    Cataract 2007   bilateral cat ext    Cholecystitis    Complication of anesthesia Feb 21, 2014   atrial fib after cholecystectomy, lasted a few hours, none since   Dysrhythmia 02-21-2014   after cholecystectomy had atrial fib for a few hours, none since   GERD (gastroesophageal reflux disease)    Had endoscopy no history of Barrett's   Hx of nonmelanoma skin cancer    Unspecified hereditary and idiopathic peripheral neuropathy      Past Surgical History:  Procedure Laterality Date   ABDOMINAL HYSTERECTOMY  15 yrs ago   complete   BIOPSY  07/09/2022   Procedure: BIOPSY;  Surgeon: Kathi Der, MD;  Location: [facility];  Service: Gastroenterology;;   BREAST CYST ASPIRATION     CHOLECYSTECTOMY N/A 02/21/2014   Procedure: LAPAROSCOPIC CHOLECYSTECTOMY WITH INTRAOPERATIVE CHOLANGIOGRAM;  Surgeon: Frederik Schmidt, MD;  Location: [facility] OR;  Service: General;  Laterality: N/A;   ENDOSCOPIC RETROGRADE CHOLANGIOPANCREATOGRAPHY (ERCP) WITH PROPOFOL N/A 05/29/2014   Procedure: ENDOSCOPIC RETROGRADE CHOLANGIOPANCREATOGRAPHY (ERCP) WITH PROPOFOL;  Surgeon: Willis Modena, MD;  Location: [facility];  Service: Endoscopy;  Laterality: N/A;   ERCP N/A 02/24/2014   Procedure: ENDOSCOPIC RETROGRADE CHOLANGIOPANCREATOGRAPHY (ERCP);  Surgeon: Willis Modena, MD;  Location: [facility] END[facility];  Service: Endoscopy;  Laterality: N/A;   ESOPHAGOGASTRODUODENOSCOPY (EGD) WITH PROPOFOL N/A 07/09/2022   Procedure: ESOPHAGOGASTRODUODENOSCOPY (EGD) WITH PROPOFOL;  Surgeon: Kathi Der, MD;  Location: [facility];  Service: Gastroenterology;  Laterality: N/A;   EYE SURGERY Bilateral 2007   both eyes cataracts with lens replacments   LUMBAR LAMINECTOMY/DECOMPRESSION MICRODISCECTOMY  02/01/2012   Procedure: LUMBAR LAMINECTOMY/DECOMPRESSION MICRODISCECTOMY 2 LEVELS;  Surgeon: Barnett Abu, MD;  Location: [facility]URO ORS;  Service: Neurosurgery;  Laterality: Left;  Left Lumbar Four-Five Lumbar Five-Sacral One Laminectomies/Foraminotomies/Microscope   SPYGLASS CHOLANGIOSCOPY N/A 05/29/2014   Procedure: SPYGLASS CHOLANGIOSCOPY;  Surgeon: Willis Modena, MD;  Location: [facility];  Service: Endoscopy;  Laterality: N/A;   TONSILLECTOMY  age  22    Social History:   reports that she quit smoking about 59 years ago. Her smoking use included cigarettes. She started smoking about 66 years ago. She has a 1.8 pack-year smoking history. She has never used smokeless  tobacco. She reports current alcohol use of about 7.0 standard drinks of alcohol per week. She reports that she does not use drugs.  Allergies  Allergen Reactions   Keflex [Cephalexin]     Dizziness   Lyrica [Pregabalin] Other (See Comments)    "High Fever"    Oxytetracycline Other (See Comments)    "High Fever" caused by terramycin  Other Reaction(s): high fever   Memantine Rash    Family History  Problem Relation Age of Onset   Neurodegenerative disease Mother        Shy Drager died at 30   Alcohol abuse Other    Heart disease Other      Prior to Admission medications   Medication Sig Start Date End Date Taking? Authorizing Provider  acetaminophen (TYLENOL) 500 MG tablet Take 500 mg by mouth every 6 (six) hours as needed for mild pain.    Yes [provider]  Apoaequorin (PREVAGEN) 10 MG CAPS Take 10 mg by mouth daily.   Yes [provider]  estradiol (CLIMARA - DOSED IN MG/24 HR) 0.025 mg/24hr patch APPLY ONCE WEEKLY ON SATURDAY Patient taking differently: Place 0.025 mg onto the skin once a week. APPLY ONCE WEEKLY ON SATURDAY 07/21/22  Yes Panosh, Neta Mends, MD  gabapentin (NEURONTIN) 300 MG capsule Take 1 capsule (300 mg total) by mouth 4 (four) times daily. 11/03/22  Yes Panosh, Neta Mends, MD  Multiple Vitamin (MULTIVITAMIN) tablet Take 1 tablet by mouth daily after lunch.   Yes [provider]  simvastatin (ZOCOR) 10 MG tablet Take 1 tablet (10 mg total) by mouth daily. 12/20/22  Yes Panosh, Neta Mends, MD    Physical Exam: Vitals:   12/28/22 1131 12/28/22 1407 12/28/22 1530 12/28/22 1548  BP: (!) 148/76  (!) 141/67   Pulse: 69  67   Resp: 18  17   Temp:  98.1 F (36.7 C)  97.7 F (36.5 C)  TempSrc:  Oral  Oral  SpO2: 99%  99%      Constitutional: NAD, calm  Eyes: PERTLA, lids and conjunctivae normal ENMT: Mucous membranes are moist. Posterior pharynx clear of any exudate or lesions.   Neck: supple, no masses  Respiratory: no wheezing, no  crackles. No accessory muscle use.  Cardiovascular: S1 & S2 heard, regular rate and rhythm. No extremity edema.   Abdomen: No distension, no tenderness, soft. Bowel sounds active.  Musculoskeletal: no clubbing / cyanosis. No joint deformity upper and lower extremities.   Skin: no significant rashes, lesions, ulcers. Warm, dry, well-perfused. Neurologic: CN 2-12 grossly intact. Moving all extremities. Alert, oriented to person and knows she is in a hospital.  Psychiatric: Calm. Cooperative.    Labs and Imaging on Admission: I have personally reviewed following labs and imaging studies  CBC: Recent Labs  Lab 12/28/22 1115  WBC 6.7  NEUTROABS 4.8  HGB 12.7  HCT 38.1  MCV 96.0  PLT 251   Basic Metabolic Panel: Recent Labs  Lab 12/28/22 1115  NA 135  K 4.3  CL 102  CO2 24  GLUCOSE 103*  BUN 12  CREATININE 0.66  CALCIUM 8.8*   GFR: Estimated Creatinine Clearance: 39.6 mL/min (by C-G formula based on SCr of 0.66 mg/dL). Liver Function Tests: Recent Labs  Lab 12/28/22 1115  AST 22  ALT 15  ALKPHOS 56  BILITOT 0.8  PROT 6.3*  ALBUMIN 3.4*   No results for input(s): "LIPASE", "AMYLASE" in the last 168 hours. No results for input(s): "AMMONIA" in the last 168 hours. Coagulation Profile: No results for input(s): "INR", "PROTIME" in the last 168 hours. Cardiac Enzymes: No results for input(s): "CKTOTAL", "CKMB", "CKMBINDEX", "TROPONINI" in the last 168 hours. BNP (last 3 results) No results for input(s): "PROBNP" in the last 8760 hours. HbA1C: No results for input(s): "HGBA1C" in the last 72 hours. CBG: No results for input(s): "GLUCAP" in the last 168 hours. Lipid Profile: No results for input(s): "CHOL", "HDL", "LDLCALC", "TRIG", "CHOLHDL", "LDLDIRECT" in the last 72 hours. Thyroid Function Tests: No results for input(s): "TSH", "T4TOTAL", "FREET4", "T3FREE", "THYROIDAB" in the last 72 hours. Anemia Panel: No results for input(s): "VITAMINB12", "FOLATE",  "FERRITIN", "TIBC", "IRON", "RETICCTPCT" in the last 72 hours. Urine analysis:    Component Value Date/Time   COLORURINE YELLOW 07/05/2017 1825   APPEARANCEUR CLEAR 07/05/2017 1825   LABSPEC 1.025 01/23/2022 1345   PHURINE 6.0 01/23/2022 1345   GLUCOSEU NEGATIVE 01/23/2022 1345   GLUCOSEU NEGATIVE 02/26/2016 1031   HGBUR NEGATIVE 01/23/2022 1345   BILIRUBINUR NEGATIVE 01/23/2022 1345   BILIRUBINUR neg 05/05/2021 1059   KETONESUR NEGATIVE 01/23/2022 1345   PROTEINUR NEGATIVE 01/23/2022 1345   UROBILINOGEN 0.2 01/23/2022 1345   NITRITE POSITIVE (A) 01/23/2022 1345   LEUKOCYTESUR TRACE (A) 01/23/2022 1345   Sepsis Labs: @LABRCNTIP (procalcitonin:4,lacticidven:4) )No results found for this or any previous visit (from the past 240 hour(s)).   Radiological Exams on Admission: CT Angio Chest PE W and/or Wo Contrast  Result Date: 12/28/2022 CLINICAL DATA:  Pulmonary embolus suspected with high probability. Possible pulmonary emboli in the left lower chest on abdomen and pelvis CT. EXAM: CT ANGIOGRAPHY CHEST WITH CONTRAST TECHNIQUE: Multidetector CT imaging of the chest was performed using the standard protocol during bolus administration of intravenous contrast. Multiplanar CT image reconstructions and MIPs were obtained to evaluate the vascular anatomy. RADIATION DOSE REDUCTION: This exam was performed according to the departmental dose-optimization program which includes automated exposure control, adjustment of the mA and/or kV according to patient size and/or use of iterative reconstruction technique. CONTRAST:  60mL OMNIPAQUE IOHEXOL 350 MG/ML SOLN COMPARISON:  CT abdomen and pelvis 12/28/2022. Chest radiograph 05/05/2021. FINDINGS: Cardiovascular: Positive examination for pulmonary embolus. A focal filling defect is demonstrated in the left lower lobe pulmonary artery. This extends into a branch vessel in the superior segment of the left lower lung. This corresponds to the lesion on prior  abdominal CT. No additional pulmonary emboli are demonstrated. This represents very low clot burden. Heart is mildly enlarged with prominence of the left heart. RV to LV ratio is 0.77 suggesting unlikely evidence of right heart strain. Normal caliber thoracic aorta. Calcification of the aorta. Mediastinum/Nodes: Esophagus is decompressed. Thyroid gland is unremarkable. No significant lymphadenopathy. Scattered calcified lymph nodes consistent with postinflammatory change. 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 Edema or atelectasis would be less likely. No pneumothorax. Bronchial wall thickening consistent with chronic bronchitis. Upper Abdomen: Small esophageal hiatal hernia. Duodenal diverticulum. Surgical absence of the gallbladder. Musculoskeletal: Degenerative changes in the spine and shoulders. Review of the MIP images confirms the above findings. IMPRESSION: 1. Single pulmonary embolus demonstrated in the left lower lobe pulmonary artery extending into the superior segment left lower lung branch. No evidence of right  heart strain. Clot burden is low. 2. Subpleural fibrosis with ground-glass infiltrates in the lung bases may represent chronic fibrosis with active alveolitis. 3. Small esophageal hiatal hernia.  Aortic atherosclerosis. These results were called by telephone at the time of interpretation on 12/28/2022 at 4:08 pm to provider Gerhard Munch , who verbally acknowledged these results. Electronically Signed   By: Burman Nieves M.D.   On: 12/28/2022 16:16   CT ABDOMEN PELVIS W CONTRAST  Result Date: 12/28/2022 CLINICAL DATA:  Left lower quadrant pain with diarrhea. EXAM: CT ABDOMEN AND PELVIS WITH CONTRAST TECHNIQUE: Multidetector CT imaging of the abdomen and pelvis was performed using the standard protocol following bolus administration of intravenous contrast. RADIATION DOSE  REDUCTION: This exam was performed according to the departmental dose-optimization program which includes automated exposure control, adjustment of the mA and/or kV according to patient size and/or use of iterative reconstruction technique. CONTRAST:  80mL OMNIPAQUE IOHEXOL 300 MG/ML  SOLN COMPARISON:  CT 07/08/2022 FINDINGS: Lower chest: There is some bronchial wall thickening identified along lower lobes with some interstitial changes, scarring and fibrotic changes. Some ground-glass as well. No pleural effusion. Heart is enlarged. Coronary artery calcifications are seen. Small hiatal hernia. Question of a nonocclusive filling defect along the left lower lobe pulmonary artery on series 2, image 1 at the edge of the imaging field. A subtle PE is not excluded. Hepatobiliary: Fatty liver infiltration. No space-occupying liver lesion. Patent portal vein. Previous cholecystectomy. Pancreas: Global mild atrophy of the pancreas. Or focal area of volume loss towards the tail. No secondary mass lesion clearly seen. Overall appearance is similar to the prior CT. Spleen: Normal in size without focal abnormality. Adrenals/Urinary Tract: Adrenal glands are unremarkable. Kidneys are normal, without renal calculi, focal lesion, or hydronephrosis. Bladder is unremarkable. Stomach/Bowel: On this non oral contrast exam, the stomach is nondilated. Small bowel is nondilated. Again there is a small hiatal hernia. Significant stool in the rectum with some nodular thickening and some questionable enhancement distally. Please correlate with any findings of a subtle mass lesion. More proximally there are several fluid-filled loops of colon scattered. Appendix is not well seen in the right lower quadrant but no pericecal stranding or fluid. Sigmoid colon diverticulosis. Significant stool in the rectum. Focal hernia involving loops of small bowel identified along the anterolateral left pelvic wall lateral to the rectus abdominis muscle.  Vascular/Lymphatic: Aortic atherosclerosis. No enlarged abdominal or pelvic lymph nodes. Reproductive: Status post hysterectomy. No adnexal masses. Other: No free intra-air or free fluid. Musculoskeletal: Moderate degenerative changes of the spine and pelvis with multilevel posterior osteophytes and disc bulging and associated areas of canal and neural foraminal stenosis. IMPRESSION: Significant stool in the rectum with slight asymmetric wall thickening just above the anorectal junction. Please correlate with any underlying subtle mass lesion. No proximal obstruction or dilatation. Sigmoid colon diverticula. Fatty liver infiltration. Hiatal hernia. Separate anterior pelvic wall hernia left lateral, unchanged from previous, spigelian hernia. This involves small bowel. Question filling defect at the very edge of the imaging field in the left lower lobe pulmonary artery. A pulmonary embolism is not excluded. Dedicated chest CT angiogram could be performed when clinically appropriate. Critical Value/emergent results were called by telephone at the time of interpretation on 12/28/2022 at 10:16 am to provider Gerhard Munch , who verbally acknowledged these results. Electronically Signed   By: Karen Kays M.D.   On: 12/28/2022 13:24    Assessment/Plan   1. Pulmonary embolism  - She is hemodynamically stable with  low clot-burden and no CT-evidence for heart strain  - Continue IV heparin for now, check LE venous dopplers, transition to oral anticoagulant as tolerated    Addendum: Patient is a difficult stick and would require frequent lab draws for IV heparin dosing. Given low clot burden and no apparent symptoms related to the PE, plan to start Eliquis now.    2. Diarrhea  - Secondary to laxative use  - Hold laxatives, monitor volume status and electrolytes    3. Dementia  - Delirium precautions    4. Neuropathy  - Continue gabapentin     DVT prophylaxis: IV heparin  Code Status: Full  Level of  Care: Level of care: Telemetry Family Communication: Son updated by phone   Disposition Plan:  Patient is from:  [facility]  Anticipated d/c is to: [facility]  Anticipated d/c date is: 9/4 or 12/30/22  Patient currently: Pending LE venous dopplers, tolerance of anticoagulation and transition to oral anticoagulant  Consults called: None  Admission status: Observation     Briscoe Deutscher, MD [facility]  12/28/2022, 5:14 PM

## 2022-12-28 NOTE — Discharge Instructions (Signed)
Information on my medicine - ELIQUIS (apixaban)  Why was Eliquis prescribed for you? Eliquis was prescribed to treat blood clots that may have been found in the veins of your legs (deep vein thrombosis) or in your lungs (pulmonary embolism) and to reduce the risk of them occurring again.  What do You need to know about Eliquis ? The starting dose is 10 mg (two 5 mg tablets) taken TWICE daily for the FIRST SEVEN (7) DAYS, then on 01/04/23  the dose is reduced to ONE 5 mg tablet taken TWICE daily.  Eliquis may be taken with or without food.   Try to take the dose about the same time in the morning and in the evening. If you have difficulty swallowing the tablet whole please discuss with your pharmacist how to take the medication safely.  Take Eliquis exactly as prescribed and DO NOT stop taking Eliquis without talking to the doctor who prescribed the medication.  Stopping may increase your risk of developing a new blood clot.  Refill your prescription before you run out.  After discharge, you should have regular check-up appointments with your healthcare provider that is prescribing your Eliquis.    What do you do if you miss a dose? If a dose of ELIQUIS is not taken at the scheduled time, take it as soon as possible on the same day and twice-daily administration should be resumed. The dose should not be doubled to make up for a missed dose.  Important Safety Information A possible side effect of Eliquis is bleeding. You should call your healthcare provider right away if you experience any of the following: Bleeding from an injury or your nose that does not stop. Unusual colored urine (red or dark brown) or unusual colored stools (red or black). Unusual bruising for unknown reasons. A serious fall or if you hit your head (even if there is no bleeding).  Some medicines may interact with Eliquis and might increase your risk of bleeding or clotting while on Eliquis. To help avoid this,  consult your healthcare provider or pharmacist prior to using any new prescription or non-prescription medications, including herbals, vitamins, non-steroidal anti-inflammatory drugs (NSAIDs) and supplements.  This website has more information on Eliquis (apixaban): http://www.eliquis.com/eliquis/home

## 2022-12-28 NOTE — ED Triage Notes (Signed)
Pt BIB [facility] from [facility], c/o diarrhea since this AM. Per EMS, feels like something is stuck in rectum.  BP 144/80 P 79 RR 16 SpO2 92%

## 2022-12-28 NOTE — ED Provider Notes (Signed)
EMERGENCY DEPARTMENT AT [facility] Provider Note   CSN: [id number] Arrival date & time: 12/28/22  4010     History  Chief Complaint  Patient presents with   Diarrhea    Susan Ford is a 89 y.o. female.  HPI Presents from her nursing facility with concern for inconsistent stool since this morning.  She notes that she has had both loose stool and somewhat firm stool.  She has no nursing home report of hemodynamic instability, nor any notable changes.  Similarly EMS reports no hemodynamic instability in transport. Patient has dementia, level 5 caveat.     Home Medications Prior to Admission medications   Medication Sig Start Date End Date Taking? Authorizing Provider  acetaminophen (TYLENOL) 500 MG tablet Take 500 mg by mouth every 6 (six) hours as needed for mild pain.    Yes [provider]  Apoaequorin (PREVAGEN) 10 MG CAPS Take 10 mg by mouth daily.   Yes [provider]  estradiol (CLIMARA - DOSED IN MG/24 HR) 0.025 mg/24hr patch APPLY ONCE WEEKLY ON SATURDAY Patient taking differently: Place 0.025 mg onto the skin once a week. APPLY ONCE WEEKLY ON SATURDAY 07/21/22  Yes Panosh, Neta Mends, MD  gabapentin (NEURONTIN) 300 MG capsule Take 1 capsule (300 mg total) by mouth 4 (four) times daily. 11/03/22  Yes Panosh, Neta Mends, MD  Multiple Vitamin (MULTIVITAMIN) tablet Take 1 tablet by mouth daily after lunch.   Yes [provider]  simvastatin (ZOCOR) 10 MG tablet Take 1 tablet (10 mg total) by mouth daily. 12/20/22  Yes Panosh, Neta Mends, MD      Allergies    Keflex [cephalexin], Lyrica [pregabalin], Oxytetracycline, and Memantine    Review of Systems   Review of Systems  Unable to perform ROS: Dementia    Physical Exam Updated Vital Signs BP 137/65 (BP Location: Left Arm)   Pulse 62   Temp 97.7 F (36.5 C) (Oral)   Resp 16   SpO2 97%  Physical Exam Vitals and nursing note reviewed.  Constitutional:      General:  She is not in acute distress.    Appearance: She is well-developed. She is not ill-appearing or diaphoretic.  HENT:     Head: Normocephalic and atraumatic.  Eyes:     Conjunctiva/sclera: Conjunctivae normal.  Cardiovascular:     Rate and Rhythm: Normal rate and regular rhythm.  Pulmonary:     Effort: Pulmonary effort is normal. No respiratory distress.     Breath sounds: Normal breath sounds. No stridor.  Abdominal:     General: There is no distension.    Skin:    General: Skin is warm and dry.  Neurological:     Mental Status: She is alert and oriented to person, place, and time.     Cranial Nerves: No cranial nerve deficit.  Psychiatric:        Mood and Affect: Mood normal.     ED Results / Procedures / Treatments   Labs (all labs ordered are listed, but only abnormal results are displayed) Labs Reviewed  COMPREHENSIVE METABOLIC PANEL - Abnormal; Notable for the following components:      Result Value   Glucose, Bld 103 (*)    Calcium 8.8 (*)    Total Protein 6.3 (*)    Albumin 3.4 (*)    All other components within normal limits  CBC WITH DIFFERENTIAL/PLATELET  BASIC METABOLIC PANEL  CBC    EKG None  Radiology CT  Angio Chest PE W and/or Wo Contrast  Result Date: 12/28/2022 CLINICAL DATA:  Pulmonary embolus suspected with high probability. Possible pulmonary emboli in the left lower chest on abdomen and pelvis CT. EXAM: CT ANGIOGRAPHY CHEST WITH CONTRAST TECHNIQUE: Multidetector CT imaging of the chest was performed using the standard protocol during bolus administration of intravenous contrast. Multiplanar CT image reconstructions and MIPs were obtained to evaluate the vascular anatomy. RADIATION DOSE REDUCTION: This exam was performed according to the departmental dose-optimization program which includes automated exposure control, adjustment of the mA and/or kV according to patient size and/or use of iterative reconstruction technique. CONTRAST:  60mL OMNIPAQUE  IOHEXOL 350 MG/ML SOLN COMPARISON:  CT abdomen and pelvis 12/28/2022. Chest radiograph 05/05/2021. FINDINGS: Cardiovascular: Positive examination for pulmonary embolus. A focal filling defect is demonstrated in the left lower lobe pulmonary artery. This extends into a branch vessel in the superior segment of the left lower lung. This corresponds to the lesion on prior abdominal CT. No additional pulmonary emboli are demonstrated. This represents very low clot burden. Heart is mildly enlarged with prominence of the left heart. RV to LV ratio is 0.77 suggesting unlikely evidence of right heart strain. Normal caliber thoracic aorta. Calcification of the aorta. Mediastinum/Nodes: Esophagus is decompressed. Thyroid gland is unremarkable. No significant lymphadenopathy. Scattered calcified lymph nodes consistent with postinflammatory change. 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 Edema or atelectasis would be less likely. No pneumothorax. Bronchial wall thickening consistent with chronic bronchitis. Upper Abdomen: Small esophageal hiatal hernia. Duodenal diverticulum. Surgical absence of the gallbladder. Musculoskeletal: Degenerative changes in the spine and shoulders. Review of the MIP images confirms the above findings. IMPRESSION: 1. Single pulmonary embolus demonstrated in the left lower lobe pulmonary artery extending into the superior segment left lower lung branch. No evidence of right heart strain. Clot burden is low. 2. Subpleural fibrosis with ground-glass infiltrates in the lung bases may represent chronic fibrosis with active alveolitis. 3. Small esophageal hiatal hernia.  Aortic atherosclerosis. These results were called by telephone at the time of interpretation on 12/28/2022 at 4:08 pm to provider Gerhard Munch , who verbally acknowledged these results. Electronically Signed   By: Burman Nieves M.D.   On: 12/28/2022 16:16   CT ABDOMEN PELVIS W CONTRAST  Result Date: 12/28/2022 CLINICAL DATA:  Left lower quadrant pain with diarrhea. EXAM: CT ABDOMEN AND PELVIS WITH CONTRAST TECHNIQUE: Multidetector CT imaging of the abdomen and pelvis was performed using the standard protocol following bolus administration of intravenous contrast. RADIATION DOSE REDUCTION: This exam was performed according to the departmental dose-optimization program which includes automated exposure control, adjustment of the mA and/or kV according to patient size and/or use of iterative reconstruction technique. CONTRAST:  80mL OMNIPAQUE IOHEXOL 300 MG/ML  SOLN COMPARISON:  CT 07/08/2022 FINDINGS: Lower chest: There is some bronchial wall thickening identified along lower lobes with some interstitial changes, scarring and fibrotic changes. Some ground-glass as well. No pleural effusion. Heart is enlarged. Coronary artery calcifications are seen. Small hiatal hernia. Question of a nonocclusive filling defect along the left lower lobe pulmonary artery on series 2, image 1 at the edge of the imaging field. A subtle PE is not excluded. Hepatobiliary: Fatty liver infiltration. No space-occupying liver lesion. Patent portal vein. Previous cholecystectomy. Pancreas: Global mild atrophy of the pancreas. Or focal area of volume loss towards the tail. No secondary mass lesion clearly seen. Overall appearance is similar to the prior CT.  Spleen: Normal in size without focal abnormality. Adrenals/Urinary Tract: Adrenal glands are unremarkable. Kidneys are normal, without renal calculi, focal lesion, or hydronephrosis. Bladder is unremarkable. Stomach/Bowel: On this non oral contrast exam, the stomach is nondilated. Small bowel is nondilated. Again there is a small hiatal hernia. Significant stool in the rectum with some nodular thickening and some questionable enhancement distally. Please correlate with any findings of a subtle mass  lesion. More proximally there are several fluid-filled loops of colon scattered. Appendix is not well seen in the right lower quadrant but no pericecal stranding or fluid. Sigmoid colon diverticulosis. Significant stool in the rectum. Focal hernia involving loops of small bowel identified along the anterolateral left pelvic wall lateral to the rectus abdominis muscle. Vascular/Lymphatic: Aortic atherosclerosis. No enlarged abdominal or pelvic lymph nodes. Reproductive: Status post hysterectomy. No adnexal masses. Other: No free intra-air or free fluid. Musculoskeletal: Moderate degenerative changes of the spine and pelvis with multilevel posterior osteophytes and disc bulging and associated areas of canal and neural foraminal stenosis. IMPRESSION: Significant stool in the rectum with slight asymmetric wall thickening just above the anorectal junction. Please correlate with any underlying subtle mass lesion. No proximal obstruction or dilatation. Sigmoid colon diverticula. Fatty liver infiltration. Hiatal hernia. Separate anterior pelvic wall hernia left lateral, unchanged from previous, spigelian hernia. This involves small bowel. Question filling defect at the very edge of the imaging field in the left lower lobe pulmonary artery. A pulmonary embolism is not excluded. Dedicated chest CT angiogram could be performed when clinically appropriate. Critical Value/emergent results were called by telephone at the time of interpretation on 12/28/2022 at 10:16 am to provider Gerhard Munch , who verbally acknowledged these results. Electronically Signed   By: Karen Kays M.D.   On: 12/28/2022 13:24    Procedures Procedures    Medications Ordered in ED Medications  simvastatin (ZOCOR) tablet 10 mg (has no administration in time range)  gabapentin (NEURONTIN) capsule 300 mg (has no administration in time range)  sodium chloride flush (NS) 0.9 % injection 3 mL (has no administration in time range)  acetaminophen  (TYLENOL) tablet 650 mg (has no administration in time range)    Or  acetaminophen (TYLENOL) suppository 650 mg (has no administration in time range)  iohexol (OMNIPAQUE) 300 MG/ML solution 100 mL (80 mLs Intravenous Contrast Given 12/28/22 1143)  sodium chloride 0.9 % bolus 1,000 mL (0 mLs Intravenous Stopped 12/28/22 1550)  iohexol (OMNIPAQUE) 350 MG/ML injection 60 mL (60 mLs Intravenous Contrast Given 12/28/22 1424)    ED Course/ Medical Decision Making/ A&P Clinical Course as of 12/28/22 1724  Tue Dec 28, 2022  1133 Glucose(!): 103 [ES]  1134 Calcium(!): 8.8 [ES]    Clinical Course User Index [ES] Margaretmary Bayley, Student-PA                                 Medical Decision Making Elderly female with dementia presents with loose stool.  Patient denies abdominal pain, but has some guarding on exam.  Patient's vital signs are reassuring.  Dementia somewhat prohibitive, but concern for colitis versus other infectious process versus mass.   Amount and/or Complexity of Data Reviewed External Data Reviewed: notes. Labs: ordered. Decision-making details documented in ED Course. Radiology: ordered and independent interpretation performed. Decision-making details documented in ED Course.  Risk Prescription drug management. Decision regarding hospitalization. Diagnosis or treatment significantly limited by social determinants of health.  After initial evaluation  abdominal pelvis CT reviewed, discussed with radiology concern for possible pulmonary embolism identified in the left lower quadrant. Patient will have CT angiography, receive additional fluids, have reduced IV contrast dose.  5:24 PM I spoke with the patient's son via telephone and informed him of all results.  On repeat exam patient remains in similar condition, has had multiple bowel movements while in the ED.  No clear that the patient previously had constipation and has been drinking MiraLAX for the past few days in an effort  to combat this.  Some suspicion for patient's bowel movements being secondary to MiraLAX dosing.  Patient is aware of PE as is her son, as well as need for admission with initiation of heparin. Final Clinical Impression(s) / ED Diagnoses Final diagnoses:  Acute pulmonary embolism without acute cor pulmonale, unspecified pulmonary embolism type (HCC)  Diarrhea, unspecified type   CRITICAL CARE Performed by: Gerhard Munch Total critical care time: 35 minutes Critical care time was exclusive of separately billable procedures and treating other patients. Critical care was necessary to treat or prevent imminent or life-threatening deterioration. Critical care was time spent personally by me on the following activities: development of treatment plan with patient and/or surrogate as well as nursing, discussions with consultants, evaluation of patient's response to treatment, examination of patient, obtaining history from patient or surrogate, ordering and performing treatments and interventions, ordering and review of laboratory studies, ordering and review of radiographic studies, pulse oximetry and re-evaluation of patient's condition.    Gerhard Munch, MD 12/28/22 1726

## 2022-12-29 ENCOUNTER — Observation Stay (HOSPITAL_BASED_OUTPATIENT_CLINIC_OR_DEPARTMENT_OTHER): Payer: Medicare Other

## 2022-12-29 DIAGNOSIS — Z86711 Personal history of pulmonary embolism: Secondary | ICD-10-CM | POA: Diagnosis not present

## 2022-12-29 DIAGNOSIS — I2699 Other pulmonary embolism without acute cor pulmonale: Secondary | ICD-10-CM | POA: Diagnosis not present

## 2022-12-29 LAB — BASIC METABOLIC PANEL
Anion gap: 8 (ref 5–15)
BUN: 10 mg/dL (ref 8–23)
CO2: 22 mmol/L (ref 22–32)
Calcium: 8 mg/dL — ABNORMAL LOW (ref 8.9–10.3)
Chloride: 102 mmol/L (ref 98–111)
Creatinine, Ser: 0.62 mg/dL (ref 0.44–1.00)
GFR, Estimated: >60 mL/min (ref >60)
Glucose, Bld: 94 mg/dL (ref 70–99)
Potassium: 3.5 mmol/L (ref 3.5–5.1)
Sodium: 132 mmol/L — ABNORMAL LOW (ref 135–145)

## 2022-12-29 LAB — CBC
HCT: 33.5 % — ABNORMAL LOW (ref 36.0–46.0)
Hemoglobin: 11.1 g/dL — ABNORMAL LOW (ref 12.0–15.0)
MCH: 31.8 pg (ref 26.0–34.0)
MCHC: 33.1 g/dL (ref 30.0–36.0)
MCV: 96 fL (ref 80.0–100.0)
Platelets: 218 10*3/uL (ref 150–400)
RBC: 3.49 MIL/uL — ABNORMAL LOW (ref 3.87–5.11)
RDW: 12.4 % (ref 11.5–15.5)
WBC: 6.6 10*3/uL (ref 4.0–10.5)
nRBC: 0 % (ref 0.0–0.2)

## 2022-12-29 MED ORDER — BISACODYL 10 MG RE SUPP
10.0000 mg | Freq: Once | RECTAL | Status: AC
  Administered 2022-12-29: 10 mg via RECTAL
  Filled 2022-12-29: qty 1

## 2022-12-29 NOTE — Plan of Care (Signed)
  Problem: Clinical Measurements: Goal: Respiratory complications will improve Outcome: Progressing Goal: Cardiovascular complication will be avoided Outcome: Progressing   Problem: Activity: Goal: Risk for activity intolerance will decrease Outcome: Progressing   Problem: Coping: Goal: Level of anxiety will decrease Outcome: Progressing   

## 2022-12-29 NOTE — Hospital Course (Addendum)
89yof w/ Alzheimer dementia, peripheral neuropathy, and PUD who presented with 1 day of diarrhea which reportedly started after taking MiraLAX a few days ago for constipation. Patient reports that she was able to manually break up a stool ball in her rectum on her own and has been taking MiraLAX, but then began having loose stools ED Course: afebrile and saturating well on room air with normal heart rate and stable blood pressure.  Labs are notable for normal electrolytes, normal renal function, and normal hemoglobin.  CT of the abdomen and pelvis reveals significant stool in the rectum with slightly asymmetric wall thickening and question of left lower lobe PE.  CTA chest confirmed single LLL PE with low clot burden and no heart strain.Patient was given a liter of saline and started on Eliquis and admitted.  Patient continued to complain of ongoing constipation feels like having bowel movement but "nothing coming out" With aggressive Dulcolax/laxatives enema patient having bowel movement at this time feels comfortable

## 2022-12-29 NOTE — Evaluation (Signed)
Physical Therapy Evaluation Patient Details Name: Susan Ford MRN: [id number] DOB: [birth year] Today's Date: 12/29/2022  History of Present Illness  89 yo female admitted with constipation, acute PT. Hx of dementia, neuropathy, OA, back surgery, anemia  Clinical Impression  On eval, pt was Supv-Mod Ind with mobility. She walked to/from bathroom x 2 with RW. Pt denied dizziness. She politely declined hallway ambulation 2* fear of bowel incontinence-having some diarrhea and just received suppository. Do not anticipate pt will have any f/u PT needs at this time. Will plan to follow during hospital stay. Will also ask mobility team to work with pt as well while she is here.         If plan is discharge home, recommend the following:     Can travel by private vehicle        Equipment Recommendations None recommended by PT  Recommendations for Other Services       Functional Status Assessment Patient has had a recent decline in their functional status and demonstrates the ability to make significant improvements in function in a reasonable and predictable amount of time.     Precautions / Restrictions Precautions Precautions: None Restrictions Weight Bearing Restrictions: No      Mobility  Bed Mobility Overal bed mobility: Modified Independent                  Transfers Overall transfer level: Modified independent                      Ambulation/Gait Ambulation/Gait assistance: Supervision Gait Distance (Feet): 15 Feet (x4) Assistive device: Rolling walker (2 wheels) Gait Pattern/deviations: Step-through pattern, Decreased stride length, Trunk flexed       General Gait Details: To and from bathroom x 2. No LOB with RW use. Pt denied dizziness. She politely declined hallway ambulation due to possibility of bowel incontinence  Stairs            Wheelchair Mobility     Tilt Bed    Modified Rankin (Stroke Patients Only)       Balance  Overall balance assessment: Mild deficits observed, not formally tested                                           Pertinent Vitals/Pain Pain Assessment Pain Assessment: No/denies pain    Home Living Family/patient expects to be discharged to:: Private residence Living Arrangements: Alone   Type of Home: Independent living facility ([facility]) Home Access: Level entry       Home Layout: One level Home Equipment: Agricultural consultant (2 wheels);Cane - single point      Prior Function Prior Level of Function : Independent/Modified Independent             Mobility Comments: uses walker ADLs Comments: mod ind "unless I need help"     Extremity/Trunk Assessment   Upper Extremity Assessment Upper Extremity Assessment: Generalized weakness    Lower Extremity Assessment Lower Extremity Assessment: Generalized weakness    Cervical / Trunk Assessment Cervical / Trunk Assessment: Kyphotic  Communication   Communication Communication: No apparent difficulties  Cognition Arousal: Alert Behavior During Therapy: WFL for tasks assessed/performed Overall Cognitive Status: History of cognitive impairments - at baseline  General Comments      Exercises     Assessment/Plan    PT Assessment Patient needs continued PT services  PT Problem List Decreased mobility       PT Treatment Interventions DME instruction;Gait training;Functional mobility training;Therapeutic activities;Therapeutic exercise;Patient/family education;Balance training    PT Goals (Current goals can be found in the Care Plan section)  Acute Rehab PT Goals Patient Stated Goal: to be able to have bowel movement PT Goal Formulation: With patient Time For Goal Achievement: 01/12/23 Potential to Achieve Goals: Good    Frequency Min 1X/week     Co-evaluation               AM-PAC PT "6 Clicks" Mobility  Outcome  Measure Help needed turning from your back to your side while in a flat bed without using bedrails?: None Help needed moving from lying on your back to sitting on the side of a flat bed without using bedrails?: None Help needed moving to and from a bed to a chair (including a wheelchair)?: None Help needed standing up from a chair using your arms (e.g., wheelchair or bedside chair)?: None Help needed to walk in hospital room?: A Little Help needed climbing 3-5 steps with a railing? : A Little 6 Click Score: 22    End of Session   Activity Tolerance: Patient tolerated treatment well Patient left: in bed;with call bell/phone within reach   PT Visit Diagnosis: Unsteadiness on feet (R26.81)    Time: 0981-1914 PT Time Calculation (min) (ACUTE ONLY): 15 min   Charges:   PT Evaluation $PT Eval Low Complexity: 1 Low   PT General Charges $$ ACUTE PT VISIT: 1 Visit            Faye Ramsay, PT Acute Rehabilitation  Office: [phone]

## 2022-12-29 NOTE — Progress Notes (Signed)
PROGRESS NOTE LASTAR FEDELI  OZH:[id number] DOB: 25-Jun-1929 DOA: 12/28/2022 PCP: Madelin Headings, MD  Brief Narrative/Hospital Course: [phone] w/ Alzheimer dementia, peripheral neuropathy, and PUD who presented with 1 day of diarrhea which reportedly started after taking MiraLAX a few days ago for constipation. Patient reports that she was able to manually break up a stool ball in her rectum on her own and has been taking MiraLAX, but then began having loose stools ED Course: afebrile and saturating well on room air with normal heart rate and stable blood pressure.  Labs are notable for normal electrolytes, normal renal function, and normal hemoglobin.  CT of the abdomen and pelvis reveals significant stool in the rectum with slightly asymmetric wall thickening and question of left lower lobe PE.  CTA chest confirmed single LLL PE with low clot burden and no heart strain.Patient was given a liter of saline and started on IV heparin in the ED and admitted.     Subjective: Patient seen and examined this morning Resting comfortably, complains of sensation of feeling like having a bowel movement but has not had bowel movement yet, has a watery diarrhea.   Assessment and Plan: Principal Problem:   Pulmonary embolism (HCC) Active Problems:   Neuropathy   Dementia (HCC)   Diarrhea   Diarrhea Recent constipation Significant stool burden in the rectum: Patient with watery diarrhea following MiraLAX use for constipation.CT scan shows significant stool in the rectum with slight asymmetric wall thickening.Suspect she is having watery stool around her stool about 1 in the rectum, add Dulcolax PR.continue supportive care  Single LLL PE extending into the superior segment of left lower lung branches: No right heart strain.Has been started on Eliquis.  Duplex negative for DVT  Mild hyponatremia: Encourage p.o.  Mild anemia: In the setting of acute illness, monitor no active bleeding  noted  Dementia: Continue delirium precaution fall precaution PT OT supportive care  Neuropathy: On gabapentin.  DVT prophylaxis: Eliquis Code Status:   Code Status: Full Code Family Communication: plan of care discussed with patient/nofamily at bedside. Patient status is:  admitted as observation but remains hospitalized for ongoing  because of ongoing constipation Level of care: Telemetry   Dispo: The patient is from: home            Anticipated disposition: Home 1-2 day Objective: Vitals last 24 hrs: Vitals:   12/28/22 1832 12/28/22 2029 12/29/22 0152 12/29/22 0537  BP: (!) 145/61 127/66 120/61 (!) 110/58  Pulse: 61 64 72 66  Resp: 18 19    Temp: 98 F (36.7 C) 98.5 F (36.9 C) 98.9 F (37.2 C) 98.7 F (37.1 C)  TempSrc: Oral Oral Oral Oral  SpO2:  94% 95% 97%  Weight: 57.2 kg     Height: 5\' 4"  (1.626 m)      Weight change:   Physical Examination: General exam: alert awake, older than stated age HEENT:Oral mucosa moist, Ear/Nose WNL grossly Respiratory system: bilaterally clear BS, no use of accessory muscle Cardiovascular system: S1 & S2 +, No JVD. Gastrointestinal system: Abdomen soft,NT,ND, BS+ Nervous System:Alert, awake, moving extremities. Extremities: LE edema neg,distal peripheral pulses palpable.  Skin: No rashes,no icterus. MSK: Normal muscle bulk,tone, power  Medications reviewed:  Scheduled Meds:  apixaban  10 mg Oral BID   Followed by   Melene Muller ON 01/04/2023] apixaban  5 mg Oral BID   bisacodyl  10 mg Rectal Once   gabapentin  300 mg Oral QID   simvastatin  10 mg Oral  Daily   sodium chloride flush  3 mL Intravenous Q12H   Continuous Infusions:    Diet Order             Diet regular Room service appropriate? Yes; Fluid consistency: Thin  Diet effective now                   Intake/Output Summary (Last 24 hours) at 12/29/2022 1321 Last data filed at 12/29/2022 0100 Gross per 24 hour  Intake 1000 ml  Output 300 ml  Net 700 ml   Net  IO Since Admission: 700 mL [12/29/22 1321]  Wt Readings from Last 3 Encounters:  12/28/22 57.2 kg  12/15/22 57.2 kg  11/03/22 56 kg     Unresulted Labs (From admission, onward)     Start     Ordered   12/29/22 0500  Basic metabolic panel  Daily,   R      12/28/22 1714   12/29/22 0500  CBC  Daily,   R      12/28/22 1714          Data Reviewed: I have personally reviewed following labs and imaging studies CBC: Recent Labs  Lab 12/28/22 1115 12/29/22 0500  WBC 6.7 6.6  NEUTROABS 4.8  --   HGB 12.7 11.1*  HCT 38.1 33.5*  MCV 96.0 96.0  PLT 251 218   Basic Metabolic Panel: Recent Labs  Lab 12/28/22 1115 12/29/22 0500  NA 135 132*  K 4.3 3.5  CL 102 102  CO2 24 22  GLUCOSE 103* 94  BUN 12 10  CREATININE 0.66 0.62  CALCIUM 8.8* 8.0*   GFR: Estimated Creatinine Clearance: 39.6 mL/min (by C-G formula based on SCr of 0.62 mg/dL). Liver Function Tests: Recent Labs  Lab 12/28/22 1115  AST 22  ALT 15  ALKPHOS 56  BILITOT 0.8  PROT 6.3*  ALBUMIN 3.4*  No results found for this or any previous visit (from the past 240 hour(s)).  Antimicrobials: Anti-infectives (From admission, onward)    None     Radiology Studies: VAS [place] LOWER EXTREMITY VENOUS (DVT)  Result Date: 12/29/2022  Lower Venous DVT Study Patient Name:  MALAHNI SKIBINSKI  Date of Exam:   12/29/2022 Medical Rec #: [id number]            Accession #:    [id number] Date of Birth: 1929-05-03             Patient Gender: F Patient Age:   89 years Exam Location:  [facility] Procedure:      VAS [place] LOWER EXTREMITY VENOUS (DVT) Referring Phys: TIMOTHY OPYD --------------------------------------------------------------------------------  Indications: Pulmonary embolism.  Comparison Study: Previous exam (LLEV) on 02/04/2012 was negative for DVT Performing Technologist: Ernestene Mention RVT, RDMS  Examination Guidelines: A complete evaluation includes B-mode imaging, spectral Doppler, color Doppler, and power Doppler  as needed of all accessible portions of each vessel. Bilateral testing is considered an integral part of a complete examination. Limited examinations for reoccurring indications may be performed as noted. The reflux portion of the exam is performed with the patient in reverse Trendelenburg.  +---------+---------------+---------+-----------+----------+--------------+ RIGHT    CompressibilityPhasicitySpontaneityPropertiesThrombus Aging +---------+---------------+---------+-----------+----------+--------------+ CFV      Full           Yes      Yes                                 +---------+---------------+---------+-----------+----------+--------------+  SFJ      Full                                                        +---------+---------------+---------+-----------+----------+--------------+ FV Prox  Full           Yes      Yes                                 +---------+---------------+---------+-----------+----------+--------------+ FV Mid   Full           Yes      Yes                                 +---------+---------------+---------+-----------+----------+--------------+ FV DistalFull           Yes      Yes                                 +---------+---------------+---------+-----------+----------+--------------+ PFV      Full                                                        +---------+---------------+---------+-----------+----------+--------------+ POP      Full           Yes      Yes                                 +---------+---------------+---------+-----------+----------+--------------+ PTV      Full                                                        +---------+---------------+---------+-----------+----------+--------------+ PERO     Full                                                        +---------+---------------+---------+-----------+----------+--------------+    +---------+---------------+---------+-----------+----------+--------------+ LEFT     CompressibilityPhasicitySpontaneityPropertiesThrombus Aging +---------+---------------+---------+-----------+----------+--------------+ CFV      Full           Yes      Yes                                 +---------+---------------+---------+-----------+----------+--------------+ SFJ      Full                                                        +---------+---------------+---------+-----------+----------+--------------+  FV Prox  Full           Yes      Yes                                 +---------+---------------+---------+-----------+----------+--------------+ FV Mid   Full           Yes      Yes                                 +---------+---------------+---------+-----------+----------+--------------+ FV DistalFull           Yes      Yes                                 +---------+---------------+---------+-----------+----------+--------------+ PFV      Full                                                        +---------+---------------+---------+-----------+----------+--------------+ POP      Full           Yes      Yes                                 +---------+---------------+---------+-----------+----------+--------------+ PTV      Full                                                        +---------+---------------+---------+-----------+----------+--------------+ PERO     Full                                                        +---------+---------------+---------+-----------+----------+--------------+     Summary: BILATERAL: - No evidence of deep vein thrombosis seen in the lower extremities, bilaterally. - RIGHT: - A cystic structure is found in the popliteal fossa (2.9 x 1.2 x 3.2 cm).  LEFT: - No cystic structure found in the popliteal fossa.  *See table(s) above for measurements and observations.    Preliminary    CT Angio Chest PE W and/or  Wo Contrast  Result Date: 12/28/2022 CLINICAL DATA:  Pulmonary embolus suspected with high probability. Possible pulmonary emboli in the left lower chest on abdomen and pelvis CT. EXAM: CT ANGIOGRAPHY CHEST WITH CONTRAST TECHNIQUE: Multidetector CT imaging of the chest was performed using the standard protocol during bolus administration of intravenous contrast. Multiplanar CT image reconstructions and MIPs were obtained to evaluate the vascular anatomy. RADIATION DOSE REDUCTION: This exam was performed according to the departmental dose-optimization program which includes automated exposure control, adjustment of the mA and/or kV according to patient size and/or use of iterative reconstruction technique. CONTRAST:  60mL OMNIPAQUE IOHEXOL 350 MG/ML SOLN COMPARISON:  CT abdomen and pelvis 12/28/2022. Chest radiograph 05/05/2021. FINDINGS: Cardiovascular: Positive examination for pulmonary  embolus. A focal filling defect is demonstrated in the left lower lobe pulmonary artery. This extends into a branch vessel in the superior segment of the left lower lung. This corresponds to the lesion on prior abdominal CT. No additional pulmonary emboli are demonstrated. This represents very low clot burden. Heart is mildly enlarged with prominence of the left heart. RV to LV ratio is 0.77 suggesting unlikely evidence of right heart strain. Normal caliber thoracic aorta. Calcification of the aorta. Mediastinum/Nodes: Esophagus is decompressed. Thyroid gland is unremarkable. No significant lymphadenopathy. Scattered calcified lymph nodes consistent with postinflammatory change. 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 Edema or atelectasis would be less likely. No pneumothorax. Bronchial wall thickening consistent with chronic bronchitis. Upper Abdomen: Small esophageal hiatal hernia. Duodenal diverticulum. Surgical  absence of the gallbladder. Musculoskeletal: Degenerative changes in the spine and shoulders. Review of the MIP images confirms the above findings. IMPRESSION: 1. Single pulmonary embolus demonstrated in the left lower lobe pulmonary artery extending into the superior segment left lower lung branch. No evidence of right heart strain. Clot burden is low. 2. Subpleural fibrosis with ground-glass infiltrates in the lung bases may represent chronic fibrosis with active alveolitis. 3. Small esophageal hiatal hernia.  Aortic atherosclerosis. These results were called by telephone at the time of interpretation on 12/28/2022 at 4:08 pm to provider Gerhard Munch , who verbally acknowledged these results. Electronically Signed   By: Burman Nieves M.D.   On: 12/28/2022 16:16   CT ABDOMEN PELVIS W CONTRAST  Result Date: 12/28/2022 CLINICAL DATA:  Left lower quadrant pain with diarrhea. EXAM: CT ABDOMEN AND PELVIS WITH CONTRAST TECHNIQUE: Multidetector CT imaging of the abdomen and pelvis was performed using the standard protocol following bolus administration of intravenous contrast. RADIATION DOSE REDUCTION: This exam was performed according to the departmental dose-optimization program which includes automated exposure control, adjustment of the mA and/or kV according to patient size and/or use of iterative reconstruction technique. CONTRAST:  80mL OMNIPAQUE IOHEXOL 300 MG/ML  SOLN COMPARISON:  CT 07/08/2022 FINDINGS: Lower chest: There is some bronchial wall thickening identified along lower lobes with some interstitial changes, scarring and fibrotic changes. Some ground-glass as well. No pleural effusion. Heart is enlarged. Coronary artery calcifications are seen. Small hiatal hernia. Question of a nonocclusive filling defect along the left lower lobe pulmonary artery on series 2, image 1 at the edge of the imaging field. A subtle PE is not excluded. Hepatobiliary: Fatty liver infiltration. No space-occupying liver  lesion. Patent portal vein. Previous cholecystectomy. Pancreas: Global mild atrophy of the pancreas. Or focal area of volume loss towards the tail. No secondary mass lesion clearly seen. Overall appearance is similar to the prior CT. Spleen: Normal in size without focal abnormality. Adrenals/Urinary Tract: Adrenal glands are unremarkable. Kidneys are normal, without renal calculi, focal lesion, or hydronephrosis. Bladder is unremarkable. Stomach/Bowel: On this non oral contrast exam, the stomach is nondilated. Small bowel is nondilated. Again there is a small hiatal hernia. Significant stool in the rectum with some nodular thickening and some questionable enhancement distally. Please correlate with any findings of a subtle mass lesion. More proximally there are several fluid-filled loops of colon scattered. Appendix is not well seen in the right lower quadrant but no pericecal stranding or fluid. Sigmoid colon diverticulosis. Significant stool in the rectum. Focal hernia involving loops of small bowel identified along the anterolateral left pelvic wall lateral to the rectus abdominis muscle. Vascular/Lymphatic: Aortic atherosclerosis.  No enlarged abdominal or pelvic lymph nodes. Reproductive: Status post hysterectomy. No adnexal masses. Other: No free intra-air or free fluid. Musculoskeletal: Moderate degenerative changes of the spine and pelvis with multilevel posterior osteophytes and disc bulging and associated areas of canal and neural foraminal stenosis. IMPRESSION: Significant stool in the rectum with slight asymmetric wall thickening just above the anorectal junction. Please correlate with any underlying subtle mass lesion. No proximal obstruction or dilatation. Sigmoid colon diverticula. Fatty liver infiltration. Hiatal hernia. Separate anterior pelvic wall hernia left lateral, unchanged from previous, spigelian hernia. This involves small bowel. Question filling defect at the very edge of the imaging field  in the left lower lobe pulmonary artery. A pulmonary embolism is not excluded. Dedicated chest CT angiogram could be performed when clinically appropriate. Critical Value/emergent results were called by telephone at the time of interpretation on 12/28/2022 at 10:16 am to provider Gerhard Munch , who verbally acknowledged these results. Electronically Signed   By: Karen Kays M.D.   On: 12/28/2022 13:24     LOS: 0 days   Lanae Boast, MD [facility]  12/29/2022, 1:21 PM

## 2022-12-29 NOTE — Care Management Obs Status (Signed)
MEDICARE OBSERVATION STATUS NOTIFICATION   Patient Details  Name: Susan Ford MRN: [id number] Date of Birth: 1930/02/06   Medicare Observation Status Notification Given:  Yes    MahabirOlegario Messier, RN 12/29/2022, 1:55 PM

## 2022-12-29 NOTE — Progress Notes (Signed)
BLE venous duplex has been completed.   Results can be found under chart review under CV PROC. 12/29/2022 11:14 AM Raeanna Soberanes RVT, RDMS

## 2022-12-29 NOTE — TOC Initial Note (Signed)
Transition of Care [facility]) - Initial/Assessment Note    Patient Details  Name: Susan Ford MRN: [id number] Date of Birth: [birth year]  [facility] CM/SW Contact:    Lanier Clam, RN Phone Number: 12/29/2022, 2:09 PM  Clinical Narrative: Spoke to Robert(Son)about d/c plans from [facility] for return confirmed w/th [facility] rep Zaquail R.  PT to eval.               Expected Discharge Plan: Home/Self Care Barriers to Discharge: Continued Medical Work up   Patient Goals and CMS Choice Patient states their goals for this hospitalization and ongoing recovery are:: [facility] CMS Medicare.gov Compare Post Acute Care list provided to:: Patient Represenative (must comment) Choice offered to / list presented to : Adult Children [place] ownership interest in [facility].provided to:: Adult Children    Expected Discharge Plan and Services   Discharge Planning Services: CM Consult Post Acute Care Choice: NA Living arrangements for the past 2 months: Independent Living Facility                                      Prior Living Arrangements/Services Living arrangements for the past 2 months: Independent Living Facility Lives with:: Facility Resident   Do you feel safe going back to the place where you live?: Yes          Current home services: DME (rw)    Activities of Daily Living Home Assistive Devices/Equipment: Environmental consultant (specify type) ADL Screening (condition at time of admission) Patient's cognitive ability adequate to safely complete daily activities?: Yes Is the patient deaf or have difficulty hearing?: No Does the patient have difficulty seeing, even when wearing glasses/contacts?: No Does the patient have difficulty concentrating, remembering, or making decisions?: No Patient able to express need for assistance with ADLs?: Yes Does the patient have difficulty dressing or bathing?: No Independently  performs ADLs?: Yes (appropriate for developmental age) Does the patient have difficulty walking or climbing stairs?: Yes Weakness of Legs: Both Weakness of Arms/Hands: None  Permission Sought/Granted Permission sought to share information with : Case Manager Permission granted to share information with : Yes, Verbal Permission Granted  Share Information with NAME: Case Manager           Emotional Assessment              Admission diagnosis:  Pulmonary embolism (HCC) [I26.99] Diarrhea, unspecified type [R19.7] Acute pulmonary embolism without acute cor pulmonale, unspecified pulmonary embolism type (HCC) [I26.99] Patient Active Problem List   Diagnosis Date Noted   Pulmonary embolism (HCC) 12/28/2022   Diarrhea 12/28/2022   Melena 07/08/2022   Dementia (HCC) 10/03/2017   Gait abnormality 06/29/2016   Chronic bilateral low back pain without sciatica 06/29/2016   Memory loss 2015-10-16   Death of husband 12-Apr-2014   Bereavement due to life event 04-12-2014   Absolute anemia 04/08/2014   Atrial flutter (HCC) 02/23/2014   Acute calculous cholecystitis 02/21/2014   Spinal stenosis of lumbar region 01/04/2014   Recent bereavement 10/25/2013   Arthritis 10/25/2013   Hyperlipidemia 10/23/2013   Hereditary and idiopathic peripheral neuropathy    Nausea alone 07/14/2012   Neuropathy, peripheral 05/05/2011   High risk medication use 05/05/2011   Subcutaneous emphysema (HCC) 05/05/2011   Cough 04/03/2011   Neuropathy 04/03/2011   Osteoarthritis 05/15/2010   STIFFNESS OF JOINT NEC ANKLE AND FOOT 05/15/2010   GANGLION CYST,  WRIST, RIGHT 05/15/2010   VERTIGO, BENIGN PAROXYSMAL POSITION 01/31/2007   PCP:  Madelin Headings, MD Pharmacy:   [facility] [id number] - [place], [place] - [address] AT [facility] OF [address] & [facility] [address] [place] [address] Phone: [phone] Fax: [phone]  [id number] - [place], [place] - [address] AT  [facility] [address] [place] [address] Phone: [phone] Fax: [phone]  [id number] [place], [place] - [address] AT [facility] OF [address] & [address] [facility] [address] Phone: [phone] Fax: [phone]     Social Determinants of Health (SDOH) Social History: SDOH Screenings   Food Insecurity: No Food Insecurity (12/28/2022)  Housing: Low Risk  (12/28/2022)  Transportation Needs: No Transportation Needs (12/28/2022)  Utilities: Not At Risk (12/28/2022)  Alcohol Screen: Low Risk  (06/04/2022)  Depression (PHQ2-9): Low Risk  (06/04/2022)  Financial Resource Strain: Low Risk  (06/04/2022)  Physical Activity: Inactive (06/04/2022)  Social Connections: Moderately Integrated (06/04/2022)  Stress: No Stress Concern Present (06/04/2022)  Tobacco Use: Medium Risk (12/28/2022)   SDOH Interventions:     Readmission Risk Interventions     No data to display

## 2022-12-30 DIAGNOSIS — I2699 Other pulmonary embolism without acute cor pulmonale: Secondary | ICD-10-CM | POA: Diagnosis not present

## 2022-12-30 LAB — CBC
HCT: 34.1 % — ABNORMAL LOW (ref 36.0–46.0)
Hemoglobin: 11.3 g/dL — ABNORMAL LOW (ref 12.0–15.0)
MCH: 31.7 pg (ref 26.0–34.0)
MCHC: 33.1 g/dL (ref 30.0–36.0)
MCV: 95.8 fL (ref 80.0–100.0)
Platelets: 217 10*3/uL (ref 150–400)
RBC: 3.56 MIL/uL — ABNORMAL LOW (ref 3.87–5.11)
RDW: 12.2 % (ref 11.5–15.5)
WBC: 7 10*3/uL (ref 4.0–10.5)
nRBC: 0 % (ref 0.0–0.2)

## 2022-12-30 LAB — BASIC METABOLIC PANEL
Anion gap: 7 (ref 5–15)
BUN: 8 mg/dL (ref 8–23)
CO2: 23 mmol/L (ref 22–32)
Calcium: 8.3 mg/dL — ABNORMAL LOW (ref 8.9–10.3)
Chloride: 106 mmol/L (ref 98–111)
Creatinine, Ser: 0.6 mg/dL (ref 0.44–1.00)
GFR, Estimated: >60 mL/min (ref >60)
Glucose, Bld: 99 mg/dL (ref 70–99)
Potassium: 3.5 mmol/L (ref 3.5–5.1)
Sodium: 136 mmol/L (ref 135–145)

## 2022-12-30 MED ORDER — POLYETHYLENE GLYCOL 3350 17 G PO PACK
17.0000 g | PACK | Freq: Every day | ORAL | Status: DC
  Administered 2022-12-30 – 2023-01-02 (×4): 17 g via ORAL
  Filled 2022-12-30 (×4): qty 1

## 2022-12-30 MED ORDER — SENNA 8.6 MG PO TABS
1.0000 | ORAL_TABLET | Freq: Every day | ORAL | Status: DC
  Administered 2022-12-30 – 2023-01-02 (×4): 8.6 mg via ORAL
  Filled 2022-12-30 (×4): qty 1

## 2022-12-30 MED ORDER — ALUM & MAG HYDROXIDE-SIMETH 200-200-20 MG/5ML PO SUSP
30.0000 mL | ORAL | Status: DC | PRN
  Administered 2022-12-30: 30 mL via ORAL
  Filled 2022-12-30: qty 30

## 2022-12-30 NOTE — Plan of Care (Signed)
  Problem: Safety: Goal: Ability to remain free from injury will improve Outcome: Progressing   Problem: Skin Integrity: Goal: Risk for impaired skin integrity will decrease Outcome: Progressing   

## 2022-12-30 NOTE — Plan of Care (Signed)
  Problem: Education: Goal: Knowledge of General Education information will improve Description: Including pain rating scale, medication(s)/side effects and non-pharmacologic comfort measures Outcome: Progressing   Problem: Activity: Goal: Risk for activity intolerance will decrease Outcome: Progressing   Problem: Nutrition: Goal: Adequate nutrition will be maintained Outcome: Progressing   Problem: Coping: Goal: Level of anxiety will decrease Outcome: Progressing   

## 2022-12-30 NOTE — Progress Notes (Signed)
PROGRESS NOTE Susan Ford  ZOX:[id number] DOB: Aug 25, 1929 DOA: 12/28/2022 PCP: Madelin Headings, MD  Brief Narrative/Hospital Course: [phone] w/ Alzheimer dementia, peripheral neuropathy, and PUD who presented with 1 day of diarrhea which reportedly started after taking MiraLAX a few days ago for constipation. Patient reports that she was able to manually break up a stool ball in her rectum on her own and has been taking MiraLAX, but then began having loose stools ED Course: afebrile and saturating well on room air with normal heart rate and stable blood pressure.  Labs are notable for normal electrolytes, normal renal function, and normal hemoglobin.  CT of the abdomen and pelvis reveals significant stool in the rectum with slightly asymmetric wall thickening and question of left lower lobe PE.  CTA chest confirmed single LLL PE with low clot burden and no heart strain.Patient was given a liter of saline and started on Eliquis and admitted.  Patient continued to complain of ongoing constipation feels like having bowel movement but "nothing coming out"  She was given bisacodyl PR with some improvement and subsequently tapwater enema ordered 9/5.  If has a good bowel movement she will be discharged home     Subjective: Patient seen and examined this morning She feels stuffed up, requesting for enema.   Did have semisolid BM today  Assessment and Plan: Principal Problem:   Pulmonary embolism (HCC) Active Problems:   Neuropathy   Dementia (HCC)   Diarrhea   Diarrhea Recent constipation Significant stool burden in the rectum: Patient with watery diarrhea following MiraLAX use for constipation.CT scan shows significant stool in the rectum with slight asymmetric wall thickening.Suspect she is having watery stool around her stool> She was given bisacodyl PR with some improvement and still feeling stuffed up, tap water enema ordered 9/5-discussed with Dr. Lavonia Drafts from GI, continue stool  softener/laxatives  Single LLL PE extending into the superior segment of left lower lung branches: No right heart strain.Has been started on Eliquis.  Duplex negative for DVT  Mild hyponatremia: Encourage oral hydration  Mild anemia: In the setting of acute illness, monitor no active bleeding noted.  Monitor intermittently  Dementia: Continue delirium precaution fall precaution PT OT supportive care  Neuropathy: On gabapentin.  DVT prophylaxis: Eliquis Code Status:   Code Status: Full Code Family Communication: plan of care discussed with patient/nofamily at bedside. Patient status is:  admitted as observation but remains hospitalized for ongoing  because of ongoing constipation Level of care: Telemetry   Dispo: The patient is from: home            Anticipated disposition: Home likely tomorrow.  Objective: Vitals last 24 hrs: Vitals:   12/29/22 1336 12/29/22 2001 12/30/22 0452 12/30/22 1315  BP: 124/63 123/72 132/75 116/72  Pulse: 71 92 74 73  Resp: 16 20  16   Temp: 98.3 F (36.8 C) 97.8 F (36.6 C) 97.6 F (36.4 C) 97.8 F (36.6 C)  TempSrc: Oral Oral Oral Oral  SpO2: 94% 96% 96% 98%  Weight:   55.1 kg   Height:       Weight change: -2.053 kg  Physical Examination: General exam: alert awake, oriented HEENT:Oral mucosa moist, Ear/Nose WNL grossly Respiratory system: Bilaterally clear BS,no use of accessory muscle Cardiovascular system: S1 & S2 +, No JVD. Gastrointestinal system: Abdomen soft,NT,ND, BS+ Nervous System: Alert, awake, moving all extremities,and following commands. Extremities: LE edema neg,distal peripheral pulses palpable and warm.  Skin: No rashes,no icterus. MSK: Normal muscle bulk,tone, power  Medications reviewed:  Scheduled Meds:  apixaban  10 mg Oral BID   Followed by   Melene Muller ON 01/04/2023] apixaban  5 mg Oral BID   gabapentin  300 mg Oral QID   simvastatin  10 mg Oral Daily   sodium chloride flush  3 mL Intravenous Q12H    Continuous Infusions:    Diet Order             Diet regular Room service appropriate? Yes; Fluid consistency: Thin  Diet effective now                  No intake or output data in the 24 hours ending 12/30/22 1417  Net IO Since Admission: 1,300 mL [12/30/22 1417]  Wt Readings from Last 3 Encounters:  12/30/22 55.1 kg  12/15/22 57.2 kg  11/03/22 56 kg     Unresulted Labs (From admission, onward)     Start     Ordered   12/29/22 0500  CBC  Daily,   R      12/28/22 1714          Data Reviewed: I have personally reviewed following labs and imaging studies CBC: Recent Labs  Lab 12/28/22 1115 12/29/22 0500 12/30/22 0500  WBC 6.7 6.6 7.0  NEUTROABS 4.8  --   --   HGB 12.7 11.1* 11.3*  HCT 38.1 33.5* 34.1*  MCV 96.0 96.0 95.8  PLT 251 218 217   Basic Metabolic Panel: Recent Labs  Lab 12/28/22 1115 12/29/22 0500 12/30/22 0500  NA 135 132* 136  K 4.3 3.5 3.5  CL 102 102 106  CO2 24 22 23   GLUCOSE 103* 94 99  BUN 12 10 8   CREATININE 0.66 0.62 0.60  CALCIUM 8.8* 8.0* 8.3*   GFR: Estimated Creatinine Clearance: 39.6 mL/min (by C-G formula based on SCr of 0.6 mg/dL). Liver Function Tests: Recent Labs  Lab 12/28/22 1115  AST 22  ALT 15  ALKPHOS 56  BILITOT 0.8  PROT 6.3*  ALBUMIN 3.4*  No results found for this or any previous visit (from the past 240 hour(s)).  Antimicrobials: Anti-infectives (From admission, onward)    None     Radiology Studies: VAS [place] LOWER EXTREMITY VENOUS (DVT)  Result Date: 12/29/2022  Lower Venous DVT Study Patient Name:  Susan Ford  Date of Exam:   12/29/2022 Medical Rec #: [id number]            Accession #:    [id number] Date of Birth: [birth year]             Patient Gender: F Patient Age:   26 years Exam Location:  [facility] Procedure:      VAS [place] LOWER EXTREMITY VENOUS (DVT) Referring Phys: TIMOTHY OPYD --------------------------------------------------------------------------------  Indications:  Pulmonary embolism.  Comparison Study: Previous exam (LLEV) on 02/04/2012 was negative for DVT Performing Technologist: Ernestene Mention RVT, RDMS  Examination Guidelines: A complete evaluation includes B-mode imaging, spectral Doppler, color Doppler, and power Doppler as needed of all accessible portions of each vessel. Bilateral testing is considered an integral part of a complete examination. Limited examinations for reoccurring indications may be performed as noted. The reflux portion of the exam is performed with the patient in reverse Trendelenburg.  +---------+---------------+---------+-----------+----------+--------------+ RIGHT    CompressibilityPhasicitySpontaneityPropertiesThrombus Aging +---------+---------------+---------+-----------+----------+--------------+ CFV      Full           Yes      Yes                                 +---------+---------------+---------+-----------+----------+--------------+  SFJ      Full                                                        +---------+---------------+---------+-----------+----------+--------------+ FV Prox  Full           Yes      Yes                                 +---------+---------------+---------+-----------+----------+--------------+ FV Mid   Full           Yes      Yes                                 +---------+---------------+---------+-----------+----------+--------------+ FV DistalFull           Yes      Yes                                 +---------+---------------+---------+-----------+----------+--------------+ PFV      Full                                                        +---------+---------------+---------+-----------+----------+--------------+ POP      Full           Yes      Yes                                 +---------+---------------+---------+-----------+----------+--------------+ PTV      Full                                                         +---------+---------------+---------+-----------+----------+--------------+ PERO     Full                                                        +---------+---------------+---------+-----------+----------+--------------+   +---------+---------------+---------+-----------+----------+--------------+ LEFT     CompressibilityPhasicitySpontaneityPropertiesThrombus Aging +---------+---------------+---------+-----------+----------+--------------+ CFV      Full           Yes      Yes                                 +---------+---------------+---------+-----------+----------+--------------+ SFJ      Full                                                        +---------+---------------+---------+-----------+----------+--------------+  FV Prox  Full           Yes      Yes                                 +---------+---------------+---------+-----------+----------+--------------+ FV Mid   Full           Yes      Yes                                 +---------+---------------+---------+-----------+----------+--------------+ FV DistalFull           Yes      Yes                                 +---------+---------------+---------+-----------+----------+--------------+ PFV      Full                                                        +---------+---------------+---------+-----------+----------+--------------+ POP      Full           Yes      Yes                                 +---------+---------------+---------+-----------+----------+--------------+ PTV      Full                                                        +---------+---------------+---------+-----------+----------+--------------+ PERO     Full                                                        +---------+---------------+---------+-----------+----------+--------------+     Summary: BILATERAL: - No evidence of deep vein thrombosis seen in the lower extremities, bilaterally. - RIGHT: - A  cystic structure is found in the popliteal fossa (2.9 x 1.2 x 3.2 cm).  LEFT: - No cystic structure found in the popliteal fossa.  *See table(s) above for measurements and observations. Electronically signed by Heath Lark on 12/29/2022 at 6:22:15 PM.    Final    CT Angio Chest PE W and/or Wo Contrast  Result Date: 12/28/2022 CLINICAL DATA:  Pulmonary embolus suspected with high probability. Possible pulmonary emboli in the left lower chest on abdomen and pelvis CT. EXAM: CT ANGIOGRAPHY CHEST WITH CONTRAST TECHNIQUE: Multidetector CT imaging of the chest was performed using the standard protocol during bolus administration of intravenous contrast. Multiplanar CT image reconstructions and MIPs were obtained to evaluate the vascular anatomy. RADIATION DOSE REDUCTION: This exam was performed according to the departmental dose-optimization program which includes automated exposure control, adjustment of the mA and/or kV according to patient size and/or use of iterative reconstruction technique. CONTRAST:  60mL OMNIPAQUE IOHEXOL 350 MG/ML SOLN COMPARISON:  CT abdomen and pelvis  12/28/2022. Chest radiograph 05/05/2021. FINDINGS: Cardiovascular: Positive examination for pulmonary embolus. A focal filling defect is demonstrated in the left lower lobe pulmonary artery. This extends into a branch vessel in the superior segment of the left lower lung. This corresponds to the lesion on prior abdominal CT. No additional pulmonary emboli are demonstrated. This represents very low clot burden. Heart is mildly enlarged with prominence of the left heart. RV to LV ratio is 0.77 suggesting unlikely evidence of right heart strain. Normal caliber thoracic aorta. Calcification of the aorta. Mediastinum/Nodes: Esophagus is decompressed. Thyroid gland is unremarkable. No significant lymphadenopathy. Scattered calcified lymph nodes consistent with postinflammatory change. 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 Edema or atelectasis would be less likely. No pneumothorax. Bronchial wall thickening consistent with chronic bronchitis. Upper Abdomen: Small esophageal hiatal hernia. Duodenal diverticulum. Surgical absence of the gallbladder. Musculoskeletal: Degenerative changes in the spine and shoulders. Review of the MIP images confirms the above findings. IMPRESSION: 1. Single pulmonary embolus demonstrated in the left lower lobe pulmonary artery extending into the superior segment left lower lung branch. No evidence of right heart strain. Clot burden is low. 2. Subpleural fibrosis with ground-glass infiltrates in the lung bases may represent chronic fibrosis with active alveolitis. 3. Small esophageal hiatal hernia.  Aortic atherosclerosis. These results were called by telephone at the time of interpretation on 12/28/2022 at 4:08 pm to provider Gerhard Munch , who verbally acknowledged these results. Electronically Signed   By: Burman Nieves M.D.   On: 12/28/2022 16:16     LOS: 0 days   Lanae Boast, MD [facility]  12/30/2022, 2:17 PM

## 2022-12-30 NOTE — Progress Notes (Signed)
Mobility Specialist - Progress Note   12/30/22 1336  Mobility  Activity Ambulated with assistance in hallway  Level of Assistance Standby assist, set-up cues, supervision of patient - no hands on  Assistive Device Front wheel walker  Distance Ambulated (ft) 500 ft  Activity Response Tolerated well  Mobility Referral Yes  $Mobility charge 1 Mobility  Mobility Specialist Start Time (ACUTE ONLY) 0120  Mobility Specialist Stop Time (ACUTE ONLY) 0134  Mobility Specialist Time Calculation (min) (ACUTE ONLY) 14 min   Pt received in bed and agreeable to mobility. No complaints during session. Pt to bed after session with all needs met.   [facility]

## 2022-12-31 DIAGNOSIS — I2699 Other pulmonary embolism without acute cor pulmonale: Secondary | ICD-10-CM | POA: Diagnosis not present

## 2022-12-31 LAB — CBC
HCT: 34.5 % — ABNORMAL LOW (ref 36.0–46.0)
Hemoglobin: 11.8 g/dL — ABNORMAL LOW (ref 12.0–15.0)
MCH: 31.8 pg (ref 26.0–34.0)
MCHC: 34.2 g/dL (ref 30.0–36.0)
MCV: 93 fL (ref 80.0–100.0)
Platelets: 236 10*3/uL (ref 150–400)
RBC: 3.71 MIL/uL — ABNORMAL LOW (ref 3.87–5.11)
RDW: 12.3 % (ref 11.5–15.5)
WBC: 7.6 10*3/uL (ref 4.0–10.5)
nRBC: 0 % (ref 0.0–0.2)

## 2022-12-31 LAB — BASIC METABOLIC PANEL
Anion gap: 9 (ref 5–15)
BUN: 6 mg/dL — ABNORMAL LOW (ref 8–23)
CO2: 23 mmol/L (ref 22–32)
Calcium: 8.3 mg/dL — ABNORMAL LOW (ref 8.9–10.3)
Chloride: 101 mmol/L (ref 98–111)
Creatinine, Ser: 0.61 mg/dL (ref 0.44–1.00)
GFR, Estimated: >60 mL/min (ref >60)
Glucose, Bld: 102 mg/dL — ABNORMAL HIGH (ref 70–99)
Potassium: 3.5 mmol/L (ref 3.5–5.1)
Sodium: 133 mmol/L — ABNORMAL LOW (ref 135–145)

## 2022-12-31 NOTE — Plan of Care (Signed)
  Problem: Health Behavior/Discharge Planning: Goal: Ability to manage health-related needs will improve Outcome: Progressing   Problem: Safety: Goal: Ability to remain free from injury will improve Outcome: Progressing   Problem: Education: Goal: Knowledge of General Education information will improve Description: Including pain rating scale, medication(s)/side effects and non-pharmacologic comfort measures Outcome: Adequate for Discharge   Problem: Clinical Measurements: Goal: Will remain free from infection Outcome: Adequate for Discharge Goal: Diagnostic test results will improve Outcome: Adequate for Discharge Goal: Cardiovascular complication will be avoided Outcome: Adequate for Discharge   Problem: Activity: Goal: Risk for activity intolerance will decrease Outcome: Adequate for Discharge   Problem: Nutrition: Goal: Adequate nutrition will be maintained Outcome: Adequate for Discharge   Problem: Coping: Goal: Level of anxiety will decrease Outcome: Adequate for Discharge   Problem: Pain Managment: Goal: General experience of comfort will improve Outcome: Adequate for Discharge   Problem: Skin Integrity: Goal: Risk for impaired skin integrity will decrease Outcome: Adequate for Discharge

## 2022-12-31 NOTE — Progress Notes (Signed)
ANTICOAGULATION CONSULT NOTE   Pharmacy Consult for Apixaban Indication: pulmonary embolus  Allergies  Allergen Reactions   Keflex [Cephalexin]     Dizziness   Lyrica [Pregabalin] Other (See Comments)    "High Fever"    Oxytetracycline Other (See Comments)    "High Fever" caused by terramycin  Other Reaction(s): high fever   Memantine Rash    Patient Measurements: Height: 5\' 4"  (162.6 cm) Weight: 53.6 kg (118 lb 2.7 oz) IBW/kg (Calculated) : 54.7  Vital Signs: Temp: 98.3 F (36.8 C) (09/06 0447) Temp Source: Oral (09/06 0447) BP: 139/73 (09/06 0447) Pulse Rate: 69 (09/06 0447)  Labs: Recent Labs    12/29/22 0500 12/30/22 0500 12/31/22 0455  HGB 11.1* 11.3* 11.8*  HCT 33.5* 34.1* 34.5*  PLT 218 217 236  CREATININE 0.62 0.60 0.61    Estimated Creatinine Clearance: 38.8 mL/min (by C-G formula based on SCr of 0.61 mg/dL).   Medical History: Past Medical History:  Diagnosis Date   Arthritis    Cataract 2007   bilateral cat ext    Cholecystitis    Complication of anesthesia Feb 21, 2014   atrial fib after cholecystectomy, lasted a few hours, none since   Dysrhythmia 02-21-2014   after cholecystectomy had atrial fib for a few hours, none since   GERD (gastroesophageal reflux disease)    Had endoscopy no history of Barrett's   Hx of nonmelanoma skin cancer    Unspecified hereditary and idiopathic peripheral neuropathy     Medications:  Scheduled:   apixaban  10 mg Oral BID   Followed by   Melene Muller ON 01/04/2023] apixaban  5 mg Oral BID   gabapentin  300 mg Oral QID   polyethylene glycol  17 g Oral Daily   senna  1 tablet Oral Daily   simvastatin  10 mg Oral Daily   sodium chloride flush  3 mL Intravenous Q12H    Assessment: 26 yoF presents to ED on 9/3 with diarrhea, CT abdomen showed possible PE.  CTA chest confirmed single LLL PE with low clot burden and no heart strain. Pharmacy is consulted to dose apixaban.    Today, 12/31/22 -CBC: Hgb slightly  low but stable  Goal of Therapy:  Monitor platelets by anticoagulation protocol: Yes   Plan:  -Apixaban 10mg  PO BID x7 days, then apixaban 5mg  PO BID. -Education provided to family on 9/4 since patient has dementia  Pharmacy signing off.   Cindi Carbon, PharmD 12/31/22 9:11 AM

## 2022-12-31 NOTE — Plan of Care (Signed)
  Problem: Elimination: Goal: Will not experience complications related to bowel motility Outcome: Progressing Goal: Will not experience complications related to urinary retention Outcome: Progressing   Problem: Pain Managment: Goal: General experience of comfort will improve Outcome: Progressing   Problem: Safety: Goal: Ability to remain free from injury will improve Outcome: Progressing   

## 2022-12-31 NOTE — Progress Notes (Signed)
Mobility Specialist - Progress Note   12/31/22 1148  Mobility  Activity Ambulated with assistance in hallway  Level of Assistance Standby assist, set-up cues, supervision of patient - no hands on  Assistive Device Front wheel walker  Distance Ambulated (ft) 500 ft  Range of Motion/Exercises Active  Activity Response Tolerated well  Mobility Referral Yes  $Mobility charge 1 Mobility  Mobility Specialist Start Time (ACUTE ONLY) 1130  Mobility Specialist Stop Time (ACUTE ONLY) 1148  Mobility Specialist Time Calculation (min) (ACUTE ONLY) 18 min   Pt was found on recliner chair and agreeable to ambulate. Grew fatigued with session. At EOS returned to recliner chair with all needs met. Call bell in reach and chair alarm on.  Billey Chang Mobility Specialist

## 2022-12-31 NOTE — Progress Notes (Signed)
PROGRESS NOTE SALIMAH OTTUM  WUJ:[id number] DOB: [birth year] DOA: 12/28/2022 PCP: Madelin Headings, MD  Brief Narrative/Hospital Course: [phone] w/ Alzheimer dementia, peripheral neuropathy, and PUD who presented with 1 day of diarrhea which reportedly started after taking MiraLAX a few days ago for constipation. Patient reports that she was able to manually break up a stool ball in her rectum on her own and has been taking MiraLAX, but then began having loose stools ED Course: afebrile and saturating well on room air with normal heart rate and stable blood pressure.  Labs are notable for normal electrolytes, normal renal function, and normal hemoglobin.  CT of the abdomen and pelvis reveals significant stool in the rectum with slightly asymmetric wall thickening and question of left lower lobe PE.  CTA chest confirmed single LLL PE with low clot burden and no heart strain.Patient was given a liter of saline and started on Eliquis and admitted.  Patient continued to complain of ongoing constipation feels like having bowel movement but "nothing coming out"   Subjective: Patient seen examined this morning She feels fine but states she is not ready to go home today and wants to see how she does with bowel movement and go home tomorrow Had 2 BM yesterday and got enema as well  Assessment and Plan: Principal Problem:   Pulmonary embolism (HCC) Active Problems:   Neuropathy   Dementia (HCC)   Diarrhea   Diarrhea Recent constipation Significant stool burden in the rectum: Patient with watery diarrhea following MiraLAX use for constipation.CT scan shows significant stool in the rectum with slight asymmetric wall thickening.Suspect she is having watery stool around her stool.  Given bisacodyl PR and enema with good bowel movement.  Continuing on MiraLAX daily and Senokot.  Overall stable  Single LLL PE extending into the superior segment of left lower lung branches: No right heart strain.Has been  started on Eliquis.  Duplex negative for DVT  Mild hyponatremia: Cont po hydration Mild anemia: In the setting of acute illness, monitor no active bleeding noted.  Monitor intermittently  Dementia: Continue delirium precaution fall precaution PT OT supportive care.  Alert awake and communicative  Neuropathy: On gabapentin.  DVT prophylaxis: Eliquis Code Status:   Code Status: Full Code Family Communication: plan of care discussed with patient/nofamily at bedside. Patient status is:  admitted as observation but remains hospitalized for ongoing constipation issue.  Level of care: Telemetry   Dispo: The patient is from: home            Anticipated disposition: Home tomorrow.Patient does not feel comfortable going home today  Objective: Vitals last 24 hrs: Vitals:   12/30/22 1315 12/30/22 2041 12/31/22 0447 12/31/22 0500  BP: 116/72 128/68 139/73   Pulse: 73 76 69   Resp: 16 16 16    Temp: 97.8 F (36.6 C) 98.4 F (36.9 C) 98.3 F (36.8 C)   TempSrc: Oral Oral Oral   SpO2: 98% 97% 96%   Weight:    53.6 kg  Height:       Weight change: -1.5 kg  Physical Examination: General exam: alert awake, elderly frail not in distress  HEENT:Oral mucosa moist, Ear/Nose WNL grossly Respiratory system: Bilaterally clear BS,no use of accessory muscle Cardiovascular system: S1 & S2 +, No JVD. Gastrointestinal system: Abdomen soft,NT,ND, BS+ Nervous System: Alert, awake, moving all extremities,and following commands. Extremities: LE edema neg,distal peripheral pulses palpable and warm.  Skin: No rashes,no icterus. MSK: Normal muscle bulk,tone, power   Medications reviewed:  Scheduled  Meds:  apixaban  10 mg Oral BID   Followed by   Melene Muller ON 01/04/2023] apixaban  5 mg Oral BID   gabapentin  300 mg Oral QID   polyethylene glycol  17 g Oral Daily   senna  1 tablet Oral Daily   simvastatin  10 mg Oral Daily   sodium chloride flush  3 mL Intravenous Q12H  Continuous Infusions:  Diet  Order             Diet regular Room service appropriate? Yes; Fluid consistency: Thin  Diet effective now                   Intake/Output Summary (Last 24 hours) at 12/31/2022 0934 Last data filed at 12/31/2022 0400 Gross per 24 hour  Intake 660 ml  Output --  Net 660 ml  Net IO Since Admission: 2,200 mL [12/31/22 0934]  Wt Readings from Last 3 Encounters:  12/31/22 53.6 kg  12/15/22 57.2 kg  11/03/22 56 kg     Unresulted Labs (From admission, onward)     Start     Ordered   12/29/22 0500  CBC  Daily,   R      12/28/22 1714          Data Reviewed: I have personally reviewed following labs and imaging studies CBC: Recent Labs  Lab 12/28/22 1115 12/29/22 0500 12/30/22 0500 12/31/22 0455  WBC 6.7 6.6 7.0 7.6  NEUTROABS 4.8  --   --   --   HGB 12.7 11.1* 11.3* 11.8*  HCT 38.1 33.5* 34.1* 34.5*  MCV 96.0 96.0 95.8 93.0  PLT 251 218 217 236   Basic Metabolic Panel: Recent Labs  Lab 12/28/22 1115 12/29/22 0500 12/30/22 0500 12/31/22 0455  NA 135 132* 136 133*  K 4.3 3.5 3.5 3.5  CL 102 102 106 101  CO2 24 22 23 23   GLUCOSE 103* 94 99 102*  BUN 12 10 8  6*  CREATININE 0.66 0.62 0.60 0.61  CALCIUM 8.8* 8.0* 8.3* 8.3*   GFR: Estimated Creatinine Clearance: 38.8 mL/min (by C-G formula based on SCr of 0.61 mg/dL). Liver Function Tests: Recent Labs  Lab 12/28/22 1115  AST 22  ALT 15  ALKPHOS 56  BILITOT 0.8  PROT 6.3*  ALBUMIN 3.4*  No results found for this or any previous visit (from the past 240 hour(s)).  Antimicrobials: Anti-infectives (From admission, onward)    None     Radiology Studies: VAS [place] LOWER EXTREMITY VENOUS (DVT)  Result Date: 12/29/2022  Lower Venous DVT Study Patient Name:  YOLET DERAMO  Date of Exam:   12/29/2022 Medical Rec #: [id number]            Accession #:    [id number] Date of Birth: Jan 23, 1930             Patient Gender: F Patient Age:   89 years Exam Location:  [facility] Procedure:      VAS [place] LOWER  EXTREMITY VENOUS (DVT) Referring Phys: TIMOTHY OPYD --------------------------------------------------------------------------------  Indications: Pulmonary embolism.  Comparison Study: Previous exam (LLEV) on 02/04/2012 was negative for DVT Performing Technologist: Ernestene Mention RVT, RDMS  Examination Guidelines: A complete evaluation includes B-mode imaging, spectral Doppler, color Doppler, and power Doppler as needed of all accessible portions of each vessel. Bilateral testing is considered an integral part of a complete examination. Limited examinations for reoccurring indications may be performed as noted. The reflux portion of the exam is performed with  the patient in reverse Trendelenburg.  +---------+---------------+---------+-----------+----------+--------------+ RIGHT    CompressibilityPhasicitySpontaneityPropertiesThrombus Aging +---------+---------------+---------+-----------+----------+--------------+ CFV      Full           Yes      Yes                                 +---------+---------------+---------+-----------+----------+--------------+ SFJ      Full                                                        +---------+---------------+---------+-----------+----------+--------------+ FV Prox  Full           Yes      Yes                                 +---------+---------------+---------+-----------+----------+--------------+ FV Mid   Full           Yes      Yes                                 +---------+---------------+---------+-----------+----------+--------------+ FV DistalFull           Yes      Yes                                 +---------+---------------+---------+-----------+----------+--------------+ PFV      Full                                                        +---------+---------------+---------+-----------+----------+--------------+ POP      Full           Yes      Yes                                  +---------+---------------+---------+-----------+----------+--------------+ PTV      Full                                                        +---------+---------------+---------+-----------+----------+--------------+ PERO     Full                                                        +---------+---------------+---------+-----------+----------+--------------+   +---------+---------------+---------+-----------+----------+--------------+ LEFT     CompressibilityPhasicitySpontaneityPropertiesThrombus Aging +---------+---------------+---------+-----------+----------+--------------+ CFV      Full           Yes      Yes                                 +---------+---------------+---------+-----------+----------+--------------+  SFJ      Full                                                        +---------+---------------+---------+-----------+----------+--------------+ FV Prox  Full           Yes      Yes                                 +---------+---------------+---------+-----------+----------+--------------+ FV Mid   Full           Yes      Yes                                 +---------+---------------+---------+-----------+----------+--------------+ FV DistalFull           Yes      Yes                                 +---------+---------------+---------+-----------+----------+--------------+ PFV      Full                                                        +---------+---------------+---------+-----------+----------+--------------+ POP      Full           Yes      Yes                                 +---------+---------------+---------+-----------+----------+--------------+ PTV      Full                                                        +---------+---------------+---------+-----------+----------+--------------+ PERO     Full                                                         +---------+---------------+---------+-----------+----------+--------------+     Summary: BILATERAL: - No evidence of deep vein thrombosis seen in the lower extremities, bilaterally. - RIGHT: - A cystic structure is found in the popliteal fossa (2.9 x 1.2 x 3.2 cm).  LEFT: - No cystic structure found in the popliteal fossa.  *See table(s) above for measurements and observations. Electronically signed by Heath Lark on 12/29/2022 at 6:22:15 PM.    Final      LOS: 0 days   Lanae Boast, MD [facility]  12/31/2022, 9:34 AM

## 2023-01-01 ENCOUNTER — Other Ambulatory Visit (HOSPITAL_COMMUNITY): Payer: Self-pay

## 2023-01-01 DIAGNOSIS — I2699 Other pulmonary embolism without acute cor pulmonale: Secondary | ICD-10-CM | POA: Diagnosis not present

## 2023-01-01 LAB — CBC
HCT: 34.8 % — ABNORMAL LOW (ref 36.0–46.0)
Hemoglobin: 11.6 g/dL — ABNORMAL LOW (ref 12.0–15.0)
MCH: 32 pg (ref 26.0–34.0)
MCHC: 33.3 g/dL (ref 30.0–36.0)
MCV: 95.9 fL (ref 80.0–100.0)
Platelets: 243 10*3/uL (ref 150–400)
RBC: 3.63 MIL/uL — ABNORMAL LOW (ref 3.87–5.11)
RDW: 12.4 % (ref 11.5–15.5)
WBC: 8.5 10*3/uL (ref 4.0–10.5)
nRBC: 0 % (ref 0.0–0.2)

## 2023-01-01 MED ORDER — POLYETHYLENE GLYCOL 3350 17 G PO PACK
17.0000 g | PACK | Freq: Every day | ORAL | 0 refills | Status: DC
  Filled 2023-01-01: qty 14, 14d supply, fill #0

## 2023-01-01 MED ORDER — HYDROCORTISONE 1 % EX CREA
TOPICAL_CREAM | Freq: Two times a day (BID) | CUTANEOUS | Status: DC
  Filled 2023-01-01: qty 28

## 2023-01-01 MED ORDER — BISACODYL 10 MG RE SUPP
10.0000 mg | Freq: Once | RECTAL | Status: DC
  Filled 2023-01-01: qty 1

## 2023-01-01 MED ORDER — APIXABAN 5 MG PO TABS
ORAL_TABLET | ORAL | 0 refills | Status: DC
  Filled 2023-01-01: qty 60, 25d supply, fill #0

## 2023-01-01 MED ORDER — SENNA 8.6 MG PO TABS
1.0000 | ORAL_TABLET | Freq: Every day | ORAL | 0 refills | Status: DC
  Filled 2023-01-01: qty 120, 120d supply, fill #0

## 2023-01-01 MED ORDER — DIPHENHYDRAMINE HCL 25 MG PO CAPS
25.0000 mg | ORAL_CAPSULE | Freq: Four times a day (QID) | ORAL | Status: DC | PRN
  Administered 2023-01-01 – 2023-01-02 (×2): 25 mg via ORAL
  Filled 2023-01-01 (×2): qty 1

## 2023-01-01 MED ORDER — HYDROCORTISONE 1 % EX CREA
TOPICAL_CREAM | Freq: Two times a day (BID) | CUTANEOUS | 0 refills | Status: DC | PRN
  Filled 2023-01-01: qty 28, 30d supply, fill #0

## 2023-01-01 NOTE — Plan of Care (Signed)
  Problem: Health Behavior/Discharge Planning: Goal: Ability to manage health-related needs will improve Outcome: Progressing   Problem: Activity: Goal: Risk for activity intolerance will decrease Outcome: Progressing   Problem: Safety: Goal: Ability to remain free from injury will improve Outcome: Progressing   Problem: Clinical Measurements: Goal: Will remain free from infection Outcome: Adequate for Discharge Goal: Diagnostic test results will improve Outcome: Adequate for Discharge Goal: Respiratory complications will improve Outcome: Adequate for Discharge Goal: Cardiovascular complication will be avoided Outcome: Adequate for Discharge   Problem: Nutrition: Goal: Adequate nutrition will be maintained Outcome: Adequate for Discharge   Problem: Coping: Goal: Level of anxiety will decrease Outcome: Adequate for Discharge   Problem: Elimination: Goal: Will not experience complications related to bowel motility Outcome: Adequate for Discharge Goal: Will not experience complications related to urinary retention Outcome: Adequate for Discharge   Problem: Pain Managment: Goal: General experience of comfort will improve Outcome: Adequate for Discharge   Problem: Skin Integrity: Goal: Risk for impaired skin integrity will decrease Outcome: Adequate for Discharge   Problem: Education: Goal: Knowledge of General Education information will improve Description: Including pain rating scale, medication(s)/side effects and non-pharmacologic comfort measures Outcome: Not Applicable

## 2023-01-01 NOTE — TOC Progression Note (Addendum)
Transition of Care [facility]) - Progression Note    Patient Details  Name: Susan Ford MRN: [id number] Date of Birth: 1929-10-04  Transition of Care [facility] CM/SW Contact  Adrian Prows, RN Phone Number: 01/01/2023, 4:48 PM  Clinical Narrative:    Notified that Junious Dresser, RN is concerned about her mobility and going home alone; she is also concerned that pt has intermittent confusion; PTAR on unit for transport; pt ambulated w/ mobility specialist but nurse says she needs assist getting up; Dr Dayna Barker notified; Sharin Mons released; Junious Dresser, RN will notify pt's son; notified Benjaman Kindler at facility; [facility] will follow.   Expected Discharge Plan: Home/Self Care Barriers to Discharge: No Barriers Identified  Expected Discharge Plan and Services   Discharge Planning Services: CM Consult Post Acute Care Choice: NA Living arrangements for the past 2 months: Independent Living Facility Expected Discharge Date: 01/01/23                                     Social Determinants of Health (SDOH) Interventions SDOH Screenings   Food Insecurity: No Food Insecurity (12/28/2022)  Housing: Low Risk  (12/28/2022)  Transportation Needs: No Transportation Needs (12/28/2022)  Utilities: Not At Risk (12/28/2022)  Alcohol Screen: Low Risk  (06/04/2022)  Depression (PHQ2-9): Low Risk  (06/04/2022)  Financial Resource Strain: Low Risk  (06/04/2022)  Physical Activity: Inactive (06/04/2022)  Social Connections: Moderately Integrated (06/04/2022)  Stress: No Stress Concern Present (06/04/2022)  Tobacco Use: Medium Risk (12/28/2022)    Readmission Risk Interventions     No data to display

## 2023-01-01 NOTE — TOC Transition Note (Addendum)
Transition of Care [facility]) - CM/SW Discharge Note   Patient Details  Name: Susan Ford MRN: [id number] Date of Birth: [birth year]  [facility] CM/SW Contact:  Adrian Prows, RN Phone Number: 01/01/2023, 10:34 AM   Clinical Narrative:    D/C orders received; pt from [place]; pt and son Susan Ford notified; Mr Susan Ford is out of town and is unable to transport pt home; pt transport by Susan Ford  -1104- contacted by Benjaman Kindler from facility; she requested d/c summary and orders be faxed to [phone]; documents faxed at 1151; awaiting electronic confirmation; also LVM for [facility] to confirm receipt of documents.  -1108- electronic confirmation received  -1222- PTAR called at 1222; spoke w/ operator #[id number]; no TOC needs.    Final next level of care: Home/Self Care Barriers to Discharge: No Barriers Identified   Patient Goals and CMS Choice CMS Medicare.gov Compare Post Acute Care list provided to:: Patient Represenative (must comment) Choice offered to / list presented to : Adult Children  Discharge Placement                  Patient to be transferred to facility by: PTAR Name of family member notified: Susan Ford (son) [phone] Patient and family notified of of transfer: 01/01/23  Discharge Plan and Services Additional resources added to the After Visit Summary for     Discharge Planning Services: CM Consult Post Acute Care Choice: NA                               Social Determinants of Health (SDOH) Interventions SDOH Screenings   Food Insecurity: No Food Insecurity (12/28/2022)  Housing: Low Risk  (12/28/2022)  Transportation Needs: No Transportation Needs (12/28/2022)  Utilities: Not At Risk (12/28/2022)  Alcohol Screen: Low Risk  (06/04/2022)  Depression (PHQ2-9): Low Risk  (06/04/2022)  Financial Resource Strain: Low Risk  (06/04/2022)  Physical Activity: Inactive (06/04/2022)  Social Connections: Moderately Integrated (06/04/2022)   Stress: No Stress Concern Present (06/04/2022)  Tobacco Use: Medium Risk (12/28/2022)     Readmission Risk Interventions     No data to display

## 2023-01-01 NOTE — Plan of Care (Signed)
  Problem: Safety: Goal: Ability to remain free from injury will improve Outcome: Progressing   Problem: Skin Integrity: Goal: Risk for impaired skin integrity will decrease Outcome: Progressing   

## 2023-01-01 NOTE — Discharge Summary (Signed)
Physician Discharge Summary  Susan Ford TKZ:[id number] DOB: [birth year] DOA: 12/28/2022  PCP: Madelin Headings, MD  Admit date: 12/28/2022 Discharge date: 01/02/2023 Recommendations for Outpatient Follow-up:  Follow up with PCP in 1 weeks-call for appointment Please obtain BMP/CBC in one week  Discharge Dispo: ALF  Discharge Condition: Stable Code Status:   Code Status: Full Code Diet recommendation:  Diet Order             Diet regular Room service appropriate? Yes; Fluid consistency: Thin  Diet effective now                    Brief/Interim Summary: 89yof w/ Alzheimer dementia, peripheral neuropathy, and PUD who presented with 1 day of diarrhea which reportedly started after taking MiraLAX a few days ago for constipation. Patient reports that she was able to manually break up a stool ball in her rectum on her own and has been taking MiraLAX, but then began having loose stools ED Course: afebrile and saturating well on room air with normal heart rate and stable blood pressure.  Labs are notable for normal electrolytes, normal renal function, and normal hemoglobin.  CT of the abdomen and pelvis reveals significant stool in the rectum with slightly asymmetric wall thickening and question of left lower lobe PE.  CTA chest confirmed single LLL PE with low clot burden and no heart strain.Patient was given a liter of saline and started on Eliquis and admitted.  Patient continued to complain of ongoing constipation feels like having bowel movement but "nothing coming out" With aggressive Dulcolax/laxatives enema patient having bowel movement at this time feels comfortable   Patient had some deconditioning 89yof w/ Alzheimer dementia, peripheral neuropathy, and PUD who presented with 1 day of diarrhea which reportedly started after taking MiraLAX a few days ago for constipation. Patient reports that she was able to manually break up a stool ball in her rectum on her own and has been taking MiraLAX, but then began having loose stools ED Course: afebrile and saturating well on room air with normal heart rate and stable blood pressure.  Labs are notable for normal electrolytes, normal renal function, and normal hemoglobin.  CT of the abdomen and pelvis reveals significant stool in the rectum with slightly asymmetric wall thickening and question of left lower lobe PE.  CTA chest confirmed single LLL PE with low clot burden and no heart strain.Patient was given a liter of saline and started on Eliquis and admitted.  Patient continued to complain of ongoing constipation feels like having bowel movement but "nothing coming out" With aggressive Dulcolax/laxatives enema patient having bowel movement at this time feels comfortable   Patient had some deconditioning difficulty ambulating 01/01/23 so discharge was held. This morning seen by PT OT did very well She is alert awake oriented at baseline, no new complaints lungs are clear to auscultation alert awake nonfocal o nexam. She has been cleared for discharge She is having bowel movement she will  continue on MiraLAX/stool softener as ordered Discharge summary updated to reflect discharge date as 01/02/2023  Discharge Diagnoses:  Principal Problem:   Pulmonary embolism (HCC) Active Problems:   Neuropathy   Dementia (HCC)   Diarrhea  Diarrhea Recent constipation Significant stool burden in the rectum: Patient with watery diarrhea following MiraLAX use for constipation.CT scan shows significant stool in the rectum with slight asymmetric wall thickening.Suspect she is having watery stool around her stool.  Given bisacodyl PR and enema with good bowel movement.,  Diarrhea, continuing on MiraLAX daily and Senokot daily  Single LLL PE extending into the superior segment of left lower lung branches: No right heart strain.Has been started on Eliquis with a starter pack she will continue anticoagulation and will need at least 3 to 6 months.  First 30 days prescription for additional prescription to come from PCP patient instructed. Duplex negative for DVT  Mild hyponatremia: Cont po hydration Mild anemia: In the setting of acute illness, monitor no active bleeding noted.  Monitor intermittently  Dementia: Continue delirium precaution fall precaution PT OT supportive care.  Alert awake and communicative  Neuropathy: Continue on gabapentin.    Consults: None Subjective: Alert awake oriented having bowel movement this morning she feels comfortable going home today  Discharge Exam: Vitals:   01/01/23 2100 01/02/23 0450  BP: 115/73 111/63  Pulse: 92 85  Resp: 18 16  Temp: 98 F (36.7 C) (!) 97.4 F (36.3 C)  SpO2: 96% 95%   General: Pt is alert, awake, not in acute  distress Cardiovascular: RRR, S1/S2 +, no rubs, no gallops Respiratory: CTA bilaterally, no wheezing, no rhonchi Abdominal: Soft, NT, ND, bowel sounds + Extremities: no edema, no cyanosis  Discharge Instructions  Discharge Instructions     Discharge instructions   Complete by: As directed    Please take  Eliquis as 10 mg twice daily x 3 more days afterwards take 5 mg twice daily-need to continue at least 3 to 6 months follow-up with PCP  Please call call MD or return to ER for similar or worsening recurring problem that brought you to hospital or if any fever,nausea/vomiting,abdominal pain, uncontrolled pain, chest pain,  shortness of breath or any other alarming symptoms.  Please follow-up your doctor as instructed in a week time and call the office for appointment.  Please avoid alcohol, smoking, or any other illicit substance and maintain healthy habits including taking your regular medications as prescribed.  You were cared for by a hospitalist during your hospital stay. If you have any questions about your discharge medications or the care you received while you were in the hospital after you are discharged, you can call the unit and ask to speak with the hospitalist on call if the hospitalist that took care of you is not available.  Once you are discharged, your primary care physician will handle any further medical issues. Please note that NO REFILLS for any discharge medications will be authorized once you are discharged, as it is imperative that you return to your primary care physician (or establish a relationship with a primary care physician if you do not have one) for your aftercare needs so that they can reassess your need for medications and monitor your lab values   Increase activity slowly  Complete by: As directed       Allergies as of 01/02/2023       Reactions   Keflex [cephalexin]    Dizziness   Lyrica [pregabalin] Other (See Comments)   "High Fever"   Oxytetracycline Other (See Comments)   "High Fever" caused by terramycin Other Reaction(s): high fever   Memantine Rash        Medication List     TAKE these medications    acetaminophen 500 MG tablet Commonly known as: TYLENOL Take 500 mg by mouth every 6 (six) hours as needed for mild pain.   apixaban 5 MG  Tabs tablet Commonly known as: ELIQUIS Take 2 tablets (10 mg total) by mouth 2 (two) times daily for 3 days, THEN 1 tablet (5 mg total) 2 (two) times daily. Start taking on: January 02, 2023   estradiol 0.025 mg/24hr patch Commonly known as: CLIMARA - Do This morning seen by PT OT did very well She is alert awake oriented at baseline, no new complaints lungs are clear to auscultation alert awake nonfocal o nexam. She has been cleared for discharge She is having bowel movement she will  continue on MiraLAX/stool softener as ordered Discharge summary updated to reflect discharge date as 01/02/2023  Discharge Diagnoses:  Principal Problem:   Pulmonary embolism (HCC) Active Problems:   Neuropathy   Dementia (HCC)   Diarrhea  Diarrhea Recent constipation Significant stool burden in the rectum: Patient with watery diarrhea following MiraLAX use for constipation.CT scan shows significant stool in the rectum with slight asymmetric wall thickening.Suspect she is having watery stool around her stool.  Given bisacodyl PR and enema with good bowel movement.,  Diarrhea, continuing on MiraLAX daily and Senokot daily  Single LLL PE extending into the superior segment of left lower lung branches: No right heart strain.Has been started on Eliquis with a starter pack she will continue anticoagulation and will need at least 3 to 6 months.  First 30 days prescription for additional prescription to come from PCP patient instructed. Duplex negative for DVT  Mild hyponatremia: Cont po hydration Mild anemia: In the setting of acute illness, monitor no active bleeding noted.  Monitor intermittently  Dementia: Continue delirium precaution fall precaution PT OT supportive care.  Alert awake and communicative  Neuropathy: Continue on gabapentin.    Consults: None Subjective: Alert awake oriented having bowel movement this morning she feels comfortable going home today  Discharge Exam: Vitals:   01/01/23 2100 01/02/23 0450  BP: 115/73 111/63  Pulse: 92 85  Resp: 18 16  Temp: 98 F (36.7 C) (!) 97.4 F (36.3 C)  SpO2: 96% 95%   General: Pt is alert, awake, not in acute  distress Cardiovascular: RRR, S1/S2 +, no rubs, no gallops Respiratory: CTA bilaterally, no wheezing, no rhonchi Abdominal: Soft, NT, ND, bowel sounds + Extremities: no edema, no cyanosis  Discharge Instructions  Discharge Instructions     Discharge instructions   Complete by: As directed    Please take  Eliquis as 10 mg twice daily x 3 more days afterwards take 5 mg twice daily-need to continue at least 3 to 6 months follow-up with PCP  Please call call MD or return to ER for similar or worsening recurring problem that brought you to hospital or if any fever,nausea/vomiting,abdominal pain, uncontrolled pain, chest pain,  shortness of breath or any other alarming symptoms.  Please follow-up your doctor as instructed in a week time and call the office for appointment.  Please avoid alcohol, smoking, or any other illicit substance and maintain healthy habits including taking your regular medications as prescribed.  You were cared for by a hospitalist during your hospital stay. If you have any questions about your discharge medications or the care you received while you were in the hospital after you are discharged, you can call the unit and ask to speak with the hospitalist on call if the hospitalist that took care of you is not available.  Once you are discharged, your primary care physician will handle any further medical issues. Please note that NO REFILLS for any discharge medications will be authorized once you are discharged, as it is imperative that you return to your primary care physician (or establish a relationship with a primary care physician if you do not have one) for your aftercare needs so that they can reassess your need for medications and monitor your lab values   Increase activity slowly   Complete by: As directed       Allergies as of 01/02/2023       Reactions   Keflex [cephalexin]    Dizziness   Lyrica [pregabalin] Other (See Comments)   "High Fever"   Oxytetracycline Other (See Comments)   "High Fever" caused by terramycin Other Reaction(s): high fever   Memantine Rash        Medication List     TAKE these medications    acetaminophen 500 MG tablet Commonly known as: TYLENOL Take 500 mg by mouth every 6 (six) hours as needed for mild pain.   apixaban 5 MG  Tabs tablet Commonly known as: ELIQUIS Take 2 tablets (10 mg total) by mouth 2 (two) times daily for 3 days, THEN 1 tablet (5 mg total) 2 (two) times daily. Start taking on: January 02, 2023   estradiol 0.025 mg/24hr patch Commonly known as: CLIMARA - Dosed in mg/24 hr APPLY ONCE WEEKLY ON SATURDAY What changed:  how much to take how to take this when to take this   gabapentin 300 MG capsule Commonly known as: NEURONTIN Take 1 capsule (300 mg total) by mouth 4 (four) times daily.   hydrocortisone cream 1 % Apply topically 2 (two) times daily as needed for itching.   multivitamin tablet Take 1 tablet by mouth daily after lunch.   polyethylene glycol 17 g packet Commonly known as: MIRALAX / GLYCOLAX Take 17 g by mouth daily.   Prevagen 10 MG Caps Generic drug: Apoaequorin Take 10 mg by mouth daily.   senna 8.6 MG Tabs tablet Commonly known as: SENOKOT Take 1 tablet (8.6 mg total) by mouth daily.   simvastatin 10 MG tablet Commonly known as: ZOCOR Take 1 tablet (10 mg total) by  mouth daily.        Follow-up Information     Panosh, Neta Mends, MD Follow up in 1 week(s).   Specialties: Internal Medicine, Pediatrics Contact information: [address] [place] [address] [phone]                Allergies  Allergen Reactions   Keflex [Cephalexin]     Dizziness   Lyrica [Pregabalin] Other (See Comments)    "High Fever"    Oxytetracycline Other (See Comments)    "High Fever" caused by terramycin  Other Reaction(s): high fever   Memantine Rash    The results of significant diagnostics from this hospitalization (including imaging, microbiology, ancillary and laboratory) are listed below for reference.    Microbiology: No results found for this or any previous visit (from the past 240 hour(s)).  Procedures/Studies: VAS [place] LOWER EXTREMITY VENOUS (DVT)  Result Date: 12/29/2022  Lower Venous DVT Study Patient Name:  Susan Ford  Date  of Exam:   12/29/2022 Medical Rec #: [id number]            Accession #:    [id number] Date of Birth: Feb 07, 1930             Patient Gender: F Patient Age:   64 years Exam Location:  [facility] Procedure:      VAS [place] LOWER EXTREMITY VENOUS (DVT) Referring Phys: TIMOTHY OPYD --------------------------------------------------------------------------------  Indications: Pulmonary embolism.  Comparison Study: Previous exam (LLEV) on 02/04/2012 was negative for DVT Performing Technologist: Ernestene Mention RVT, RDMS  Examination Guidelines: A complete evaluation includes B-mode imaging, spectral Doppler, color Doppler, and power Doppler as needed of all accessible portions of each vessel. Bilateral testing is considered an integral part of a complete examination. Limited examinations for reoccurring indications may be performed as noted. The reflux portion of the exam is performed with the patient in reverse Trendelenburg.  +---------+---------------+---------+-----------+----------+--------------+ RIGHT    CompressibilityPhasicitySpontaneityPropertiesThrombus Aging +---------+---------------+---------+-----------+----------+--------------+ CFV      Full           Yes      Yes                                 +---------+---------------+---------+-----------+----------+--------------+ SFJ      Full                                                        +---------+---------------+---------+-----------+----------+--------------+ FV Prox  Full           Yes      Yes                                 +---------+---------------+---------+-----------+----------+--------------+ FV Mid   Full           Yes      Yes                                 +---------+---------------+---------+-----------+----------+--------------+ FV DistalFull           Yes      Yes                                 +---------+---------------+---------+-----------+----------+--------------+  PFV      Full                                                         +---------+---------------+---------+-----------+----------+--------------+ POP      Full           Yes      Yes                                 +---------+---------------+---------+-----------+----------+--------------+ PTV      Full                                                        +---------+---------------+---------+-----------+----------+--------------+ PERO     Full                                                        +---------+---------------+---------+-----------+----------+--------------+   +---------+---------------+---------+-----------+----------+--------------+ LEFT     CompressibilityPhasicitySpontaneityPropertiesThrombus Aging +---------+---------------+---------+-----------+----------+--------------+ CFV      Full           Yes      Yes                                 +---------+---------------+---------+-----------+----------+--------------+ SFJ      Full                                                        +---------+---------------+---------+-----------+----------+--------------+ FV Prox  Full           Yes      Yes                                 +---------+---------------+---------+-----------+----------+--------------+ FV Mid   Full           Yes      Yes                                 +---------+---------------+---------+-----------+----------+--------------+ FV DistalFull           Yes      Yes                                 +---------+---------------+---------+-----------+----------+--------------+ PFV      Full                                                        +---------+---------------+---------+-----------+----------+--------------+  POP      Full           Yes      Yes                                 +---------+---------------+---------+-----------+----------+--------------+ PTV      Full                                                         +---------+---------------+---------+-----------+----------+--------------+ PERO     Full                                                        +---------+---------------+---------+-----------+----------+--------------+     Summary: BILATERAL: - No evidence of deep vein thrombosis seen in the lower extremities, bilaterally. - RIGHT: - A cystic structure is found in the popliteal fossa (2.9 x 1.2 x 3.2 cm).  LEFT: - No cystic structure found in the popliteal fossa.  *See table(s) above for measurements and observations. Electronically signed by Heath Lark on 12/29/2022 at 6:22:15 PM.    Final    CT Angio Chest PE W and/or Wo Contrast  Result Date: 12/28/2022 CLINICAL DATA:  Pulmonary embolus suspected with high probability. Possible pulmonary emboli in the left lower chest on abdomen and pelvis CT. EXAM: CT ANGIOGRAPHY CHEST WITH CONTRAST TECHNIQUE: Multidetector CT imaging of the chest was performed using the standard protocol during bolus administration of intravenous contrast. Multiplanar CT image reconstructions and MIPs were obtained to evaluate the vascular anatomy. RADIATION DOSE REDUCTION: This exam was performed according to the departmental dose-optimization program which includes automated exposure control, adjustment of the mA and/or kV according to patient size and/or use of iterative reconstruction technique. CONTRAST:  60mL OMNIPAQUE IOHEXOL 350 MG/ML SOLN COMPARISON:  CT abdomen and pelvis 12/28/2022. Chest radiograph 05/05/2021. FINDINGS: Cardiovascular: Positive examination for pulmonary embolus. A focal filling defect is demonstrated in the left lower lobe pulmonary artery. This extends into a branch vessel in the superior segment of the left lower lung. This corresponds to the lesion on prior abdominal CT. No additional pulmonary emboli are demonstrated. This represents very low clot burden. Heart is mildly enlarged with prominence of the left heart. RV to LV ratio is 0.77 suggesting  unlikely evidence of right heart strain. Normal caliber thoracic aorta. Calcification of the aorta. Mediastinum/Nodes: Esophagus is decompressed. Thyroid gland is unremarkable. No significant lymphadenopathy. Scattered calcified lymph nodes consistent with postinflammatory change. 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 Edema or atelectasis would be less likely. No pneumothorax. Bronchial wall thickening consistent with chronic bronchitis. Upper Abdomen: Small esophageal hiatal hernia. Duodenal diverticulum. Surgical absence of the gallbladder. Musculoskeletal: Degenerative changes in the spine and shoulders. Review of the MIP images confirms the above findings. IMPRESSION: 1. Single pulmonary embolus demonstrated in the left lower lobe pulmonary artery extending into the superior segment left lower lung branch. No evidence of right heart strain. Clot burden is low. 2. Subpleural fibrosis with ground-glass infiltrates in the lung bases may represent chronic fibrosis with active alveolitis.  3. Small esophageal hiatal hernia.  Aortic atherosclerosis. These results were called by telephone at the time of interpretation on 12/28/2022 at 4:08 pm to provider Gerhard Munch , who verbally acknowledged these results. Electronically Signed   By: Burman Nieves M.D.   On: 12/28/2022 16:16   CT ABDOMEN PELVIS W CONTRAST  Result Date: 12/28/2022 CLINICAL DATA:  Left lower quadrant pain with diarrhea. EXAM: CT ABDOMEN AND PELVIS WITH CONTRAST TECHNIQUE: Multidetector CT imaging of the abdomen and pelvis was performed using the standard protocol following bolus administration of intravenous contrast. RADIATION DOSE REDUCTION: This exam was performed according to the departmental dose-optimization program which includes automated exposure control, adjustment of the mA and/or kV according to patient size and/or use  of iterative reconstruction technique. CONTRAST:  80mL OMNIPAQUE IOHEXOL 300 MG/ML  SOLN COMPARISON:  CT 07/08/2022 FINDINGS: Lower chest: There is some bronchial wall thickening identified along lower lobes with some interstitial changes, scarring and fibrotic changes. Some ground-glass as well. No pleural effusion. Heart is enlarged. Coronary artery calcifications are seen. Small hiatal hernia. Question of a nonocclusive filling defect along the left lower lobe pulmonary artery on series 2, image 1 at the edge of the imaging field. A subtle PE is not excluded. Hepatobiliary: Fatty liver infiltration. No space-occupying liver lesion. Patent portal vein. Previous cholecystectomy. Pancreas: Global mild atrophy of the pancreas. Or focal area of volume loss towards the tail. No secondary mass lesion clearly seen. Overall appearance is similar to the prior CT. Spleen: Normal in size without focal abnormality. Adrenals/Urinary Tract: Adrenal glands are unremarkable. Kidneys are normal, without renal calculi, focal lesion, or hydronephrosis. Bladder is unremarkable. Stomach/Bowel: On this non oral contrast exam, the stomach is nondilated. Small bowel is nondilated. Again there is a small hiatal hernia. Significant stool in the rectum with some nodular thickening and some questionable enhancement distally. Please correlate with any findings of a subtle mass lesion. More proximally there are several fluid-filled loops of colon scattered. Appendix is not well seen in the right lower quadrant but no pericecal stranding or fluid. Sigmoid colon diverticulosis. Significant stool in the rectum. Focal hernia involving loops of small bowel identified along the anterolateral left pelvic wall lateral to the rectus abdominis muscle. Vascular/Lymphatic: Aortic atherosclerosis. No enlarged abdominal or pelvic lymph nodes. Reproductive: Status post hysterectomy. No adnexal masses. Other: No free intra-air or free fluid. Musculoskeletal:  Moderate degenerative changes of the spine and pelvis with multilevel posterior osteophytes and disc bulging and associated areas of canal and neural foraminal stenosis. IMPRESSION: Significant stool in the rectum with slight asymmetric wall thickening just above the anorectal junction. Please correlate with any underlying subtle mass lesion. No proximal obstruction or dilatation. Sigmoid colon diverticula. Fatty liver infiltration. Hiatal hernia. Separate anterior pelvic wall hernia left lateral, unchanged from previous, spigelian hernia. This involves small bowel. Question filling defect at the very edge of the imaging field in the left lower lobe pulmonary artery. A pulmonary embolism is not excluded. Dedicated chest CT angiogram could be performed when clinically appropriate. Critical Value/emergent results were called by telephone at the time of interpretation on 12/28/2022 at 10:16 am to provider Gerhard Munch , who verbally acknowledged these results. Electronically Signed   By: Karen Kays M.D.   On: 12/28/2022 13:24    Labs: BNP (last 3 results) No results for input(s): "BNP" in the last 8760 hours. Basic Metabolic Panel: Recent Labs  Lab 12/28/22 1115 12/29/22 0500 12/30/22 0500 12/31/22 0455  NA 135 132* 136 133*  K 4.3 3.5 3.5 3.5  CL 102 102 106 101  CO2 24 22 23 23   GLUCOSE 103* 94 99 102*  BUN 12 10 8  6*  CREATININE 0.66 0.62 0.60 0.61  CALCIUM 8.8* 8.0* 8.3* 8.3*   Liver Function Tests: Recent Labs  Lab 12/28/22 1115  AST 22  ALT 15  ALKPHOS 56  BILITOT 0.8  PROT 6.3*  ALBUMIN 3.4*   No results for input(s): "LIPASE", "AMYLASE" in the last 168 hours. No results for input(s): "AMMONIA" in the last 168 hours. CBC: Recent Labs  Lab 12/28/22 1115 12/29/22 0500 12/30/22 0500 12/31/22 0455 01/01/23 0455 01/02/23 0506  WBC 6.7 6.6 7.0 7.6 8.5 6.8  NEUTROABS 4.8  --   --   --   --   --   HGB 12.7 11.1* 11.3* 11.8* 11.6* 11.5*  HCT 38.1 33.5* 34.1* 34.5* 34.8*  34.4*  MCV 96.0 96.0 95.8 93.0 95.9 94.8  PLT 251 218 217 236 243 246  Anemia work up No results for input(s): "VITAMINB12", "FOLATE", "FERRITIN", "TIBC", "IRON", "RETICCTPCT" in the last 72 hours. Urinalysis    Component Value Date/Time   COLORURINE YELLOW 07/05/2017 1825   APPEARANCEUR CLEAR 07/05/2017 1825   LABSPEC 1.025 01/23/2022 1345   PHURINE 6.0 01/23/2022 1345   GLUCOSEU NEGATIVE 01/23/2022 1345   GLUCOSEU NEGATIVE 02/26/2016 1031   HGBUR NEGATIVE 01/23/2022 1345   BILIRUBINUR NEGATIVE 01/23/2022 1345   BILIRUBINUR neg 05/05/2021 1059   KETONESUR NEGATIVE 01/23/2022 1345   PROTEINUR NEGATIVE 01/23/2022 1345   UROBILINOGEN 0.2 01/23/2022 1345   NITRITE POSITIVE (A) 01/23/2022 1345   LEUKOCYTESUR TRACE (A) 01/23/2022 1345   Sepsis Labs Recent Labs  Lab 12/30/22 0500 12/31/22 0455 01/01/23 0455 01/02/23 0506  WBC 7.0 7.6 8.5 6.8   Microbiology No results found for this or any previous visit (from the past 240 hour(s)).   Time coordinating discharge: 25 minutes  SIGNED: Lanae Boast, MD  [facility] 01/02/2023, 12:04 PM  If 7PM-7AM, please contact night-coverage [email or web address]

## 2023-01-02 LAB — CBC
HCT: 34.4 % — ABNORMAL LOW (ref 36.0–46.0)
Hemoglobin: 11.5 g/dL — ABNORMAL LOW (ref 12.0–15.0)
MCH: 31.7 pg (ref 26.0–34.0)
MCHC: 33.4 g/dL (ref 30.0–36.0)
MCV: 94.8 fL (ref 80.0–100.0)
Platelets: 246 10*3/uL (ref 150–400)
RBC: 3.63 MIL/uL — ABNORMAL LOW (ref 3.87–5.11)
RDW: 12.4 % (ref 11.5–15.5)
WBC: 6.8 10*3/uL (ref 4.0–10.5)
nRBC: 0 % (ref 0.0–0.2)

## 2023-01-02 MED ORDER — APIXABAN 5 MG PO TABS: ORAL_TABLET | ORAL | 0 refills | Status: DC

## 2023-01-02 MED ORDER — SENNA 8.6 MG PO TABS: 1.0000 | ORAL_TABLET | Freq: Every day | ORAL | 0 refills | Status: DC

## 2023-01-02 MED ORDER — POLYETHYLENE GLYCOL 3350 17 G PO PACK: 17.0000 g | PACK | Freq: Every day | ORAL | 0 refills | Status: DC

## 2023-01-02 NOTE — Progress Notes (Signed)
Patient had some deconditioning difficulty ambulating 9/7 so discharge was held. This morning seen by PT OT did very well She is alert awake oriented at baseline, no new complaints lungs are clear to auscultation alert awake nonfocal o nexam. She has been cleared for discharge She is having bowel movement she will continue on MiraLAX/stool softener as ordered Discharge summary updated to reflect discharge date as 01/02/2023

## 2023-01-02 NOTE — Progress Notes (Signed)
AVS and discharge instructions reviewed w/ patient. Patient verbalized understanding. Patient transporting to [facility] via [facility] per Child psychotherapist.

## 2023-01-02 NOTE — Evaluation (Signed)
Occupational Therapy Evaluation Patient Details Name: Susan Ford MRN: [id number] DOB: 1929-12-04 Today's Date: 01/02/2023   History of Present Illness 89 yo female admitted with constipation and Single LLL PE extending into the superior segment of left lower lung branches. Hx of dementia, neuropathy, OA, back surgery, anemia   Clinical Impression   Pt admitted with the above diagnoses and presents with below problem list. Pt will benefit from continued acute OT to address the below listed deficits and maximize independence with basic ADLs prior to d/c. At baseline, pt lives in ILF and is mod I with ADLs. Pt currently needs up to CGA with LB ADLs, setup for UB ADLs.         If plan is discharge home, recommend the following:      Functional Status Assessment  Patient has had a recent decline in their functional status and demonstrates the ability to make significant improvements in function in a reasonable and predictable amount of time.  Equipment Recommendations  None recommended by OT    Recommendations for Other Services       Precautions / Restrictions Precautions Precautions: Fall Restrictions Weight Bearing Restrictions: No      Mobility Bed Mobility Overal bed mobility: Modified Independent                  Transfers Overall transfer level: Modified independent                        Balance Overall balance assessment: Mild deficits observed, not formally tested                                         ADL either performed or assessed with clinical judgement   ADL Overall ADL's : Needs assistance/impaired Eating/Feeding: Set up;Sitting   Grooming: Set up;Sitting   Upper Body Bathing: Set up;Sitting   Lower Body Bathing: Contact guard assist;Sit to/from stand   Upper Body Dressing : Set up;Sitting   Lower Body Dressing: Contact guard assist;Sit to/from stand   Toilet Transfer: Contact guard  assist;Ambulation;Rolling walker (2 wheels);Comfort height toilet;Grab bars   Toileting- Clothing Manipulation and Hygiene: Contact guard assist;Sit to/from stand   Tub/ Shower Transfer: Walk-in shower;Contact guard assist;Ambulation;Shower seat;Rolling walker (2 wheels)   Functional mobility during ADLs: Contact guard assist;Rolling walker (2 wheels) General ADL Comments: Pt walked in the hall a total of about 30' using rw and CGA.     Vision         Perception         Praxis         Pertinent Vitals/Pain Pain Assessment Pain Assessment: No/denies pain     Extremity/Trunk Assessment Upper Extremity Assessment Upper Extremity Assessment: Generalized weakness   Lower Extremity Assessment Lower Extremity Assessment: Generalized weakness;Defer to PT evaluation   Cervical / Trunk Assessment Cervical / Trunk Assessment: Kyphotic   Communication Communication Communication: No apparent difficulties   Cognition Arousal: Alert Behavior During Therapy: WFL for tasks assessed/performed Overall Cognitive Status: History of cognitive impairments - at baseline                                       General Comments       Exercises     Shoulder Instructions  Home Living Family/patient expects to be discharged to:: Private residence Living Arrangements: Alone Available Help at Discharge: Family Type of Home: Independent living facility ([facility]) Home Access: Level entry     Home Layout: One level     Bathroom Shower/Tub: Walk-in shower         Home Equipment: Agricultural consultant (2 wheels);Cane - single point          Prior Functioning/Environment Prior Level of Function : Independent/Modified Independent             Mobility Comments: uses walker ADLs Comments: mod ind "unless I need help"        OT Problem List: Decreased strength;Impaired balance (sitting and/or standing);Decreased knowledge of use of DME or  AE;Decreased knowledge of precautions;Decreased activity tolerance      OT Treatment/Interventions: Self-care/ADL training;Therapeutic exercise;DME and/or AE instruction;Energy conservation;Patient/family education;Balance training;Therapeutic activities    OT Goals(Current goals can be found in the care plan section) Acute Rehab OT Goals Patient Stated Goal: back to ILF and daily routine OT Goal Formulation: With patient Time For Goal Achievement: 01/16/23 Potential to Achieve Goals: Good ADL Goals Pt Will Perform Upper Body Dressing: with set-up;sitting Pt Will Perform Lower Body Dressing: with modified independence;sit to/from stand Pt Will Transfer to Toilet: with modified independence;ambulating Pt Will Perform Toileting - Clothing Manipulation and hygiene: with modified independence;sit to/from stand  OT Frequency: Min 2X/week    Co-evaluation              AM-PAC OT "6 Clicks" Daily Activity     Outcome Measure Help from another person eating meals?: None Help from another person taking care of personal grooming?: None Help from another person toileting, which includes using toliet, bedpan, or urinal?: None Help from another person bathing (including washing, rinsing, drying)?: A Little Help from another person to put on and taking off regular upper body clothing?: None Help from another person to put on and taking off regular lower body clothing?: A Little 6 Click Score: 22   End of Session Equipment Utilized During Treatment: Rolling walker (2 wheels)  Activity Tolerance: Patient tolerated treatment well;Patient limited by fatigue Patient left: in bed;with call bell/phone within reach  OT Visit Diagnosis: Unsteadiness on feet (R26.81);Muscle weakness (generalized) (M62.81)                Time: 9147-8295 OT Time Calculation (min): 9 min Charges:  OT General Charges $OT Visit: 1 Visit OT Evaluation $OT Eval Low Complexity: 1 Low  Raynald Kemp, OT Acute  Rehabilitation Services Office: [phone]   Pilar Grammes 01/02/2023, 10:34 AM

## 2023-01-02 NOTE — Progress Notes (Signed)
[facility] [address]. [facility] and gave information for free 30 day card for Eliquis. Claim has been processed.   Cindi Carbon, PharmD 01/02/23 1:13 PM

## 2023-01-02 NOTE — Plan of Care (Signed)
  Problem: Health Behavior/Discharge Planning: Goal: Ability to manage health-related needs will improve Outcome: Progressing   Problem: Clinical Measurements: Goal: Ability to maintain clinical measurements within normal limits will improve Outcome: Progressing   Problem: Activity: Goal: Risk for activity intolerance will decrease Outcome: Progressing   Problem: Coping: Goal: Level of anxiety will decrease Outcome: Progressing   Problem: Elimination: Goal: Will not experience complications related to bowel motility Outcome: Progressing   Problem: Pain Managment: Goal: General experience of comfort will improve Outcome: Progressing

## 2023-01-02 NOTE — TOC Transition Note (Addendum)
Transition of Care [facility]) - CM/SW Discharge Note   Patient Details  Name: Susan Ford MRN: [id number] Date of Birth: [birth year]  Transition of Care [facility]) CM/SW Contact:  Adrian Prows, RN Phone Number: 01/02/2023, 1:54 PM   Clinical Narrative:    Pt transport by taxi; Therapist, nutritional and Release of Liability signed; spoke w/ Helene Shoe at [employer] taxi; arranged 2 PM pick up at [facility] entrance; taxi voucher given to [place], Charity fundraiser; notified Barney at [facility] of pt pick up time; she will have staff at facility awaiting with wheel chair for pt arrival; d/c summary and AVS faxed to facility [phone] @ 1412; see TOC progress note dated 01/02/23; no TOC needs.   -1418- electronic confirmation received  Final next level of care: Home/Self Care Barriers to Discharge: No Barriers Identified   Patient Goals and CMS Choice CMS Medicare.gov Compare Post Acute Care list provided to:: Patient Represenative (must comment) Choice offered to / list presented to : Adult Children  Discharge Placement                  Patient to be transferred to facility by: Taxi Name of family member notified: Donnald Garre (son) and Nelle Don (son) Patient and family notified of of transfer: 01/02/23  Discharge Plan and Services Additional resources added to the After Visit Summary for     Discharge Planning Services: CM Consult Post Acute Care Choice: NA                               Social Determinants of Health (SDOH) Interventions SDOH Screenings   Food Insecurity: No Food Insecurity (12/28/2022)  Housing: Low Risk  (12/28/2022)  Transportation Needs: No Transportation Needs (12/28/2022)  Utilities: Not At Risk (12/28/2022)  Alcohol Screen: Low Risk  (06/04/2022)  Depression (PHQ2-9): Low Risk  (06/04/2022)  Financial Resource Strain: Low Risk  (06/04/2022)  Physical Activity: Inactive (06/04/2022)  Social Connections: Moderately Integrated (06/04/2022)  Stress: No Stress  Concern Present (06/04/2022)  Tobacco Use: Medium Risk (12/28/2022)     Readmission Risk Interventions     No data to display

## 2023-01-02 NOTE — TOC Progression Note (Addendum)
Transition of Care [facility]) - Progression Note    Patient Details  Name: Susan Ford MRN: [id number] Date of Birth: [birth year]  [facility]) CM/SW Contact  Adrian Prows, RN Phone Number: 01/02/2023, 11:55 AM  Clinical Narrative:    Notified pt is ready for d/c; pt needs transport home and to RX from pharmacy; pt lives at [facility] [place]; LVM for Benjaman Kindler at facility to discuss; also contacted pt's son Donnald Garre [phone]); he says he is out of town on vacation, and his siblings live out of town; spoke w/ pt in room and she does not have anyone who can assisted; called facility and spoke w/ Dow Adolph at facility; she will see if pt is enrolled in "[facility]", facility home service; she will call this RNCM with additional info; also pt says she does not have discount card for Eliquis that was previously provided by  hospital pharmacy; Corrie Dandy, PhD will bring pt another card.  -1215Dow Adolph from facility says front desk can receive pt upon arrival and [facility] will check on pt; she says the facility does not have transportation services today and they can not pick up RX; she also verified d/c address:[address], [address] [place] [address]; pt alert and oriented x 4; updated her on d/c plan.  -1230- called [facility] [address] [facility] [facility] to check availability of RX delivery; spoke w/ Sinya at pharmacy and she says delivery service is available, but pt must set this up; LVM for pt's son to see if he can assist in facilitation of pt getting meds  -1244- call back by pt's son Casimiro Needle; he says he lives in [place], [place] which is over 2 hours away; he will contact a neighbor for assist; he will call this RNCM back.  -1252- pt's son Casimiro Needle called back and says pt's former neighbor will pick medication up for her; Mary PhD will [facility] to see if discount card can be called in; also pt says she does not have her walker; called facility to see if they can have pt's walker bought  to front desk upon arrival; spoke w/ [place], and she says pt will be met at desk with wheel chair.  -1312- pt, and sons Casimiro Needle and Fredrik Cove notified; they agree to d/c plan; Casimiro Needle will notify neighbor that medications will not be ready until after 2 PM; Corrie Dandy has sent info to pharmacy   Expected Discharge Plan: Home/Self Care Barriers to Discharge: No Barriers Identified  Expected Discharge Plan and Services   Discharge Planning Services: CM Consult Post Acute Care Choice: NA Living arrangements for the past 2 months: Independent Living Facility Expected Discharge Date: 01/01/23                                     Social Determinants of Health (SDOH) Interventions SDOH Screenings   Food Insecurity: No Food Insecurity (12/28/2022)  Housing: Low Risk  (12/28/2022)  Transportation Needs: No Transportation Needs (12/28/2022)  Utilities: Not At Risk (12/28/2022)  Alcohol Screen: Low Risk  (06/04/2022)  Depression (PHQ2-9): Low Risk  (06/04/2022)  Financial Resource Strain: Low Risk  (06/04/2022)  Physical Activity: Inactive (06/04/2022)  Social Connections: Moderately Integrated (06/04/2022)  Stress: No Stress Concern Present (06/04/2022)  Tobacco Use: Medium Risk (12/28/2022)    Readmission Risk Interventions     No data to display

## 2023-01-02 NOTE — Plan of Care (Signed)

## 2023-01-02 NOTE — Progress Notes (Signed)
Physical Therapy Treatment Patient Details Name: KLAUDIA SHIPLETT MRN: [id number] DOB: January 20, 1930 Today's Date: 01/02/2023   History of Present Illness 89 yo female admitted with constipation and Single LLL PE extending into the superior segment of left lower lung branches. Hx of dementia, neuropathy, OA, back surgery, anemia    PT Comments  Pt requesting to use bathroom. Pt ambulated to/from bathroom without physical assist. Pt declined ambulating in hallway due to ambulating with OT earlier.  Pt feels she is at her baseline in regards to mobility and feels good about d/c back to ILF today.    If plan is discharge home, recommend the following:     Can travel by private vehicle        Equipment Recommendations  None recommended by PT    Recommendations for Other Services       Precautions / Restrictions Precautions Precautions: Fall Restrictions Weight Bearing Restrictions: No     Mobility  Bed Mobility Overal bed mobility: Modified Independent                  Transfers Overall transfer level: Modified independent                      Ambulation/Gait Ambulation/Gait assistance: Supervision Gait Distance (Feet): 8 Feet (x2) Assistive device: Rolling walker (2 wheels) Gait Pattern/deviations: Step-through pattern, Decreased stride length, Trunk flexed       General Gait Details: ambulated to/from bathroom, trunk flexed however pt reports ambulation at baseline; pt declined hallway ambulation since she ambulated with OT earlier   Stairs             Wheelchair Mobility     Tilt Bed    Modified Rankin (Stroke Patients Only)       Balance                                            Cognition Arousal: Alert Behavior During Therapy: WFL for tasks assessed/performed Overall Cognitive Status: History of cognitive impairments - at baseline                                          Exercises       General Comments        Pertinent Vitals/Pain Pain Assessment Pain Assessment: No/denies pain    Home Living Family/patient expects to be discharged to:: Private residence Living Arrangements: Alone Available Help at Discharge: Family Type of Home: Independent living facility ([facility]) Home Access: Level entry       Home Layout: One level Home Equipment: Agricultural consultant (2 wheels);Cane - single point      Prior Function            PT Goals (current goals can now be found in the care plan section) Progress towards PT goals: Progressing toward goals    Frequency    Min 1X/week      PT Plan      Co-evaluation              AM-PAC PT "6 Clicks" Mobility   Outcome Measure  Help needed turning from your back to your side while in a flat bed without using bedrails?: None Help needed moving from lying on your back to sitting  on the side of a flat bed without using bedrails?: None Help needed moving to and from a bed to a chair (including a wheelchair)?: None Help needed standing up from a chair using your arms (e.g., wheelchair or bedside chair)?: None Help needed to walk in hospital room?: A Little Help needed climbing 3-5 steps with a railing? : A Little 6 Click Score: 22    End of Session Equipment Utilized During Treatment: Gait belt Activity Tolerance: Patient tolerated treatment well Patient left: with call bell/phone within reach;in chair;with chair alarm set Nurse Communication: Mobility status PT Visit Diagnosis: Difficulty in walking, not elsewhere classified (R26.2)     Time: 3086-5784 PT Time Calculation (min) (ACUTE ONLY): 10 min  Charges:    $Gait Training: 8-22 mins PT General Charges $$ ACUTE PT VISIT: 1 Visit                     Paulino Door, DPT Physical Therapist Acute Rehabilitation Services Office: [phone]    Janan Halter [place] 01/02/2023, 12:10 PM

## 2023-01-03 ENCOUNTER — Other Ambulatory Visit (HOSPITAL_COMMUNITY): Payer: Self-pay

## 2023-01-06 ENCOUNTER — Telehealth: Payer: Self-pay | Admitting: Internal Medicine

## 2023-01-06 NOTE — Telephone Encounter (Signed)
Pt called requesting to speak to MD. Pt was asked if it was regarding a medication refill, as I could help with that. Pt stated it was for something else and would rather speak directly to MD.   Pt asked that MD please call back at their earliest convenience.

## 2023-01-07 DIAGNOSIS — R488 Other symbolic dysfunctions: Secondary | ICD-10-CM | POA: Diagnosis not present

## 2023-01-07 DIAGNOSIS — R2681 Unsteadiness on feet: Secondary | ICD-10-CM | POA: Diagnosis not present

## 2023-01-07 NOTE — Telephone Encounter (Signed)
Attempted to reach pt. Left a voicemail to call us back.

## 2023-01-08 ENCOUNTER — Emergency Department (HOSPITAL_COMMUNITY): Payer: Medicare Other

## 2023-01-08 ENCOUNTER — Emergency Department (HOSPITAL_COMMUNITY)
Admission: EM | Admit: 2023-01-08 | Discharge: 2023-01-08 | Disposition: A | Discharge status: Home / Self Care | Payer: Medicare Other

## 2023-01-08 ENCOUNTER — Other Ambulatory Visit: Payer: Self-pay

## 2023-01-08 DIAGNOSIS — K5730 Diverticulosis of large intestine without perforation or abscess without bleeding: Secondary | ICD-10-CM | POA: Insufficient documentation

## 2023-01-08 DIAGNOSIS — K760 Fatty (change of) liver, not elsewhere classified: Secondary | ICD-10-CM | POA: Diagnosis not present

## 2023-01-08 DIAGNOSIS — K439 Ventral hernia without obstruction or gangrene: Secondary | ICD-10-CM | POA: Diagnosis not present

## 2023-01-08 DIAGNOSIS — K921 Melena: Secondary | ICD-10-CM | POA: Diagnosis not present

## 2023-01-08 DIAGNOSIS — R42 Dizziness and giddiness: Secondary | ICD-10-CM | POA: Diagnosis not present

## 2023-01-08 DIAGNOSIS — F0390 Unspecified dementia without behavioral disturbance: Secondary | ICD-10-CM | POA: Insufficient documentation

## 2023-01-08 DIAGNOSIS — Z20822 Contact with and (suspected) exposure to covid-19: Secondary | ICD-10-CM | POA: Diagnosis not present

## 2023-01-08 DIAGNOSIS — Z9049 Acquired absence of other specified parts of digestive tract: Secondary | ICD-10-CM | POA: Diagnosis not present

## 2023-01-08 DIAGNOSIS — Z7901 Long term (current) use of anticoagulants: Secondary | ICD-10-CM | POA: Diagnosis not present

## 2023-01-08 DIAGNOSIS — Z7401 Bed confinement status: Secondary | ICD-10-CM | POA: Diagnosis not present

## 2023-01-08 DIAGNOSIS — M4854XA Collapsed vertebra, not elsewhere classified, thoracic region, initial encounter for fracture: Secondary | ICD-10-CM | POA: Insufficient documentation

## 2023-01-08 DIAGNOSIS — R1032 Left lower quadrant pain: Secondary | ICD-10-CM | POA: Diagnosis not present

## 2023-01-08 DIAGNOSIS — R58 Hemorrhage, not elsewhere classified: Secondary | ICD-10-CM | POA: Diagnosis not present

## 2023-01-08 DIAGNOSIS — R1084 Generalized abdominal pain: Secondary | ICD-10-CM | POA: Diagnosis not present

## 2023-01-08 DIAGNOSIS — R100 Acute abdomen: Secondary | ICD-10-CM | POA: Diagnosis not present

## 2023-01-08 LAB — URINALYSIS, ROUTINE W REFLEX MICROSCOPIC
Bilirubin Urine: NEGATIVE
Glucose, UA: NEGATIVE mg/dL
Hgb urine dipstick: NEGATIVE
Ketones, ur: NEGATIVE mg/dL
Leukocytes,Ua: NEGATIVE
Nitrite: NEGATIVE
Protein, ur: NEGATIVE mg/dL
Specific Gravity, Urine: 1.009 (ref 1.005–1.030)
pH: 8 (ref 5.0–8.0)

## 2023-01-08 LAB — CBC
HCT: 37.3 % (ref 36.0–46.0)
Hemoglobin: 12.1 g/dL (ref 12.0–15.0)
MCH: 30.9 pg (ref 26.0–34.0)
MCHC: 32.4 g/dL (ref 30.0–36.0)
MCV: 95.4 fL (ref 80.0–100.0)
Platelets: 287 10*3/uL (ref 150–400)
RBC: 3.91 MIL/uL (ref 3.87–5.11)
RDW: 12.1 % (ref 11.5–15.5)
WBC: 7.9 10*3/uL (ref 4.0–10.5)
nRBC: 0 % (ref 0.0–0.2)

## 2023-01-08 LAB — RESP PANEL BY RT-PCR (RSV, FLU A&B, COVID)  RVPGX2
Influenza A by PCR: NEGATIVE
Influenza B by PCR: NEGATIVE
Resp Syncytial Virus by PCR: NEGATIVE
SARS Coronavirus 2 by RT PCR: NEGATIVE

## 2023-01-08 LAB — COMPREHENSIVE METABOLIC PANEL
ALT: 15 U/L (ref 0–44)
AST: 18 U/L (ref 15–41)
Albumin: 3.4 g/dL — ABNORMAL LOW (ref 3.5–5.0)
Alkaline Phosphatase: 56 U/L (ref 38–126)
Anion gap: 9 (ref 5–15)
BUN: 23 mg/dL (ref 8–23)
CO2: 24 mmol/L (ref 22–32)
Calcium: 9.2 mg/dL (ref 8.9–10.3)
Chloride: 100 mmol/L (ref 98–111)
Creatinine, Ser: 0.64 mg/dL (ref 0.44–1.00)
GFR, Estimated: >60 mL/min (ref >60)
Glucose, Bld: 107 mg/dL — ABNORMAL HIGH (ref 70–99)
Potassium: 4.5 mmol/L (ref 3.5–5.1)
Sodium: 133 mmol/L — ABNORMAL LOW (ref 135–145)
Total Bilirubin: 0.7 mg/dL (ref 0.3–1.2)
Total Protein: 6.8 g/dL (ref 6.5–8.1)

## 2023-01-08 LAB — LIPASE, BLOOD: Lipase: 21 U/L (ref 11–51)

## 2023-01-08 LAB — TYPE AND SCREEN
ABO/RH(D): O POS
Antibody Screen: NEGATIVE

## 2023-01-08 MED ORDER — IOHEXOL 300 MG/ML  SOLN
100.0000 mL | Freq: Once | INTRAMUSCULAR | Status: AC | PRN
  Administered 2023-01-08: 100 mL via INTRAVENOUS

## 2023-01-08 NOTE — ED Provider Notes (Signed)
[place] EMERGENCY DEPARTMENT AT [facility] Provider Note   CSN: [id number] Arrival date & time: 01/08/23  1007     History  Chief Complaint  Patient presents with   Abdominal Pain   Melena    Susan Ford is a 89 y.o. female.  89 year old female present emergency department left lower quadrant abdominal pain and melanotic stool.  She is otherwise asymptomatic no dizziness, lightheadedness, no chest pain or shortness of breath.  Reports that abdominal pain is resolved and not currently having any.  She does have some baseline dementia, reportedly at her baseline.   Abdominal Pain      Home Medications Prior to Admission medications   Medication Sig Start Date End Date Taking? Authorizing Provider  acetaminophen (TYLENOL) 500 MG tablet Take 500 mg by mouth every 6 (six) hours as needed for mild pain.     [provider]  apixaban (ELIQUIS) 5 MG TABS tablet Take 2 tablets (10 mg total) by mouth 2 (two) times daily for 3 days, THEN 1 tablet (5 mg total) 2 (two) times daily. 01/02/23 02/04/23  Lanae Boast, MD  Apoaequorin (PREVAGEN) 10 MG CAPS Take 10 mg by mouth daily.    [provider]  estradiol (CLIMARA - DOSED IN MG/24 HR) 0.025 mg/24hr patch APPLY ONCE WEEKLY ON SATURDAY Patient taking differently: Place 0.025 mg onto the skin once a week. APPLY ONCE WEEKLY ON SATURDAY 07/21/22   Panosh, Neta Mends, MD  gabapentin (NEURONTIN) 300 MG capsule Take 1 capsule (300 mg total) by mouth 4 (four) times daily. 11/03/22   Panosh, Neta Mends, MD  hydrocortisone cream 1 % Apply topically 2 (two) times daily as needed for itching. 01/01/23   Lanae Boast, MD  Multiple Vitamin (MULTIVITAMIN) tablet Take 1 tablet by mouth daily after lunch.    [provider]  polyethylene glycol (MIRALAX / GLYCOLAX) 17 g packet Take 17 g by mouth daily. 01/02/23   Lanae Boast, MD  senna (SENOKOT) 8.6 MG TABS tablet Take 1 tablet (8.6 mg total) by mouth daily. 01/02/23   Lanae Boast, MD  simvastatin (ZOCOR) 10 MG tablet Take 1 tablet (10 mg total) by mouth daily. 12/20/22   Panosh, Neta Mends, MD      Allergies    Keflex [cephalexin], Lyrica [pregabalin], Oxytetracycline, and Memantine    Review of Systems   Review of Systems  Gastrointestinal:  Positive for abdominal pain.    Physical Exam Updated Vital Signs BP (!) 158/80   Pulse 78   Temp 98.2 F (36.8 C) (Oral)   Resp 16   SpO2 99%  Physical Exam Constitutional:      General: She is not in acute distress.    Appearance: She is not toxic-appearing.  HENT:     Head: Normocephalic.  Cardiovascular:     Rate and Rhythm: Normal rate and regular rhythm.  Pulmonary:     Effort: Pulmonary effort is normal.     Breath sounds: Normal breath sounds.  Abdominal:     General: Abdomen is flat.     Tenderness: There is no abdominal tenderness. There is no guarding or rebound.  Neurological:     Mental Status: She is alert.     ED Results / Procedures / Treatments   Labs (all labs ordered are listed, but only abnormal results are displayed) Labs Reviewed  COMPREHENSIVE METABOLIC PANEL - Abnormal; Notable for the following components:      Result Value   Sodium 133 (*)  Glucose, Bld 107 (*)    Albumin 3.4 (*)    All other components within normal limits  URINALYSIS, ROUTINE W REFLEX MICROSCOPIC - Abnormal; Notable for the following components:   Color, Urine STRAW (*)    All other components within normal limits  RESP PANEL BY RT-PCR (RSV, FLU A&B, COVID)  RVPGX2  CBC  LIPASE, BLOOD  TYPE AND SCREEN    EKG None  Radiology CT ABDOMEN PELVIS W CONTRAST  Result Date: 01/08/2023 CLINICAL DATA:  Black stool. EXAM: CT ABDOMEN AND PELVIS WITH CONTRAST TECHNIQUE: Multidetector CT imaging of the abdomen and pelvis was performed using the standard protocol following bolus administration of intravenous contrast. RADIATION DOSE REDUCTION: This exam was performed according to the departmental  dose-optimization program which includes automated exposure control, adjustment of the mA and/or kV according to patient size and/or use of iterative reconstruction technique. CONTRAST:  OMNIPAQUE IOHEXOL 300 MG/ML  SOLN COMPARISON:  12/28/2022 and 02/21/2014. FINDINGS: Lower chest: Pulmonary interstitial changes consistent with fibrosis at the bases and interstitial and alveolar opacities consistent with edema or pneumonitis. No change from the prior study. Hepatobiliary: Hepatic hypodensity consistent with fatty infiltration. No focal liver abnormality is seen. Status post cholecystectomy. No biliary dilatation. Pancreas: Unremarkable. No pancreatic ductal dilatation or surrounding inflammatory changes. Spleen: Normal in size without focal abnormality. Adrenals/Urinary Tract: Adrenal glands are unremarkable. Kidneys are normal, without renal calculi, focal lesion, or hydronephrosis. Bladder is unremarkable. Stomach/Bowel: Stomach is within normal limits. Appendix not visualized and there is no appendicitis. No evidence of bowel wall thickening, distention, or inflammatory changes. There is diverticulosis of the descending and sigmoid. Vascular/Lymphatic: Aortic atherosclerosis. No enlarged abdominal or pelvic lymph nodes. Reproductive: Status post hysterectomy. No adnexal masses. Other: Left anterior abdominal wall defect laterally contains nondilated loop of bowel with no incarceration or obstruction. No free air or fluid. Musculoskeletal: Severe multifocal multilevel severe thoracolumbosacral degenerative changes. Chronic T11 wedge compression deformity stable since 2015. IMPRESSION: 1. Bibasilar pulmonary interstitial and alveolar opacities consistent with fibrosis and pneumonitis. 2. Hepatic fatty infiltration. 3. Sigmoid and descending diverticulosis. 4. Left sided spigelian hernia without incarceration or obstruction. 5. No acute abdominopelvic pathology or change from the prior study. 6. Severe  multilevel thoracolumbar degenerative changes and stable T11 compression deformity. Electronically Signed   By: Layla Maw M.D.   On: 01/08/2023 12:51    Procedures Procedures    Medications Ordered in ED Medications  iohexol (OMNIPAQUE) 300 MG/ML solution 100 mL (100 mLs Intravenous Contrast Given 01/08/23 1215)    ED Course/ Medical Decision Making/ A&P Clinical Course as of 01/08/23 1448  Sat Jan 08, 2023  1015 From d/c summary on 01/02/23: "CT of the abdomen and pelvis reveals significant stool in the rectum with slightly asymmetric wall thickening and question of left lower lobe PE.  CTA chest confirmed single LLL PE with low clot burden and no heart strain.Patient was given a liter of saline and started on Eliquis and admitted.  Patient continued to complain of ongoing constipation feels like having bowel movement but "nothing coming out" With aggressive Dulcolax/laxatives enema patient having bowel movement at this time feels comfortable   Patient had some deconditioning difficulty ambulating 01/01/23 so discharge was held. This morning seen by PT OT did very well She is alert awake oriented at baseline, no new complaints lungs are clear to auscultation alert awake nonfocal o nexam. She has been cleared for discharge She is having bowel movement she will continue on MiraLAX/stool softener as ordered  Discharge summary updated to reflect discharge date as 01/02/2023" [TY]  1058 Urinalysis, Routine w reflex microscopic -Urine, Clean Catch(!) Not consistent with UTI. [TY]  1137 Hemoglobin: 12.1 Re-assuring; slighlty up compared to recent admission.  [TY]  1315 CT ABDOMEN PELVIS W CONTRAST IMPRESSION: 1. Bibasilar pulmonary interstitial and alveolar opacities consistent with fibrosis and pneumonitis. 2. Hepatic fatty infiltration. 3. Sigmoid and descending diverticulosis. 4. Left sided spigelian hernia without incarceration or obstruction. 5. No acute abdominopelvic pathology or  change from the prior study. 6. Severe multilevel thoracolumbar degenerative changes and stable T11 compression deformity.   [TY]    Clinical Course User Index [TY] Coral Spikes, DO                                 Medical Decision Making 89 year old female presenting emergency department for melena and left lower quadrant abdominal pain.  She is afebrile nontachycardic hemodynamically stable.  Physical exam reassuring with soft nontender abdomen.  She is not having abdominal pain currently.  However, does have history of dementia and potentially unreliable as a historian.  Per chart review appears that patient was recently put on Eliquis for PE.  Also appears that she has history of peptic ulcer disease with biopsy in March of this past year with equal gastroenterology.  Vital signs are reassuring.  Her hemoglobin is actually improved from when she was in the hospital.  She has no significant metabolic derangements on her comprehensive panel.  Lipase is normal.  Pancreatitis unlikely.  She did complain of some generalized malaise and some chills, but no fever or tachycardia to suggest systemic infection.  Flu COVID-negative.  UA negative.  CT scan also without acute findings to explain her left lower quadrant abdominal pain.  Given patient's stable vitals, reassuring hemoglobin feel that she is appropriate for outpatient follow-up.  Strict precautions given.  Amount and/or Complexity of Data Reviewed Labs: ordered. Decision-making details documented in ED Course. Radiology: ordered. Decision-making details documented in ED Course.  Risk Prescription drug management.          Final Clinical Impression(s) / ED Diagnoses Final diagnoses:  Melena    Rx / DC Orders ED Discharge Orders     None         Coral Spikes, DO 01/08/23 1448

## 2023-01-08 NOTE — ED Notes (Signed)
[facility] transportation has been arranged

## 2023-01-08 NOTE — ED Triage Notes (Signed)
Pt arrived via [facility] from [facility]. Pt is complaining of some abdominal pain and had black stool this morning. Pt started taking Eliquis 2 weeks ago due to PE in left lung.   BP 132/70 HR 84 O2 97%

## 2023-01-08 NOTE — Discharge Instructions (Signed)
Please return immediately if you develop lightheadedness, dizziness, chest pain, shortness of breath, worsening abdominal pain, nausea vomiting you vomit blood or have bright red bloody bowel movements.  Please follow-up with your primary doctor, would also like you to follow-up with your gastroenterologist.

## 2023-01-08 NOTE — ED Notes (Signed)
Pt wants her IV removed because it is uncomfortable. Explained to her that we will take it out after she has been discharged

## 2023-01-08 NOTE — ED Notes (Signed)
Pt has been discharged and so I will be giving report to [facility] and call [facility] for transportation

## 2023-01-12 ENCOUNTER — Telehealth: Payer: Self-pay

## 2023-01-12 DIAGNOSIS — R488 Other symbolic dysfunctions: Secondary | ICD-10-CM | POA: Diagnosis not present

## 2023-01-12 DIAGNOSIS — R2681 Unsteadiness on feet: Secondary | ICD-10-CM | POA: Diagnosis not present

## 2023-01-12 NOTE — Telephone Encounter (Signed)
Received a fax from [facility] at [facility]. Requesting a provider signature for OT evaluation and plan of treatment.   Lov: 11/03/2022 Upcoming visit: 01/25/2023   Form placed in provider's red folder.

## 2023-01-12 NOTE — Telephone Encounter (Signed)
Form was faxed and received a confirmation.

## 2023-01-12 NOTE — Telephone Encounter (Signed)
Form signed.

## 2023-01-13 ENCOUNTER — Other Ambulatory Visit: Payer: Self-pay | Admitting: Internal Medicine

## 2023-01-17 ENCOUNTER — Telehealth: Payer: Self-pay | Admitting: Internal Medicine

## 2023-01-17 NOTE — Telephone Encounter (Signed)
Pt requesting a refill of a medication she thinks she was previously prescribed by this pcp. Does not remember the name of the medication, says it was 2 little white tablets, requests something for itching, declined OV

## 2023-01-17 NOTE — Telephone Encounter (Signed)
Attempted to reach pt. Left a detail message on pt voicemail.   Pt needs an appointment, can be a virtual to discuss on medication., if not on pt's med list.

## 2023-01-17 NOTE — Telephone Encounter (Signed)
Pt is aware waiting on md response

## 2023-01-18 NOTE — Telephone Encounter (Signed)
Pt decline to make an appt or do virtual. Pt has an appt on 01-25-2023

## 2023-01-19 DIAGNOSIS — R2689 Other abnormalities of gait and mobility: Secondary | ICD-10-CM | POA: Diagnosis not present

## 2023-01-20 DIAGNOSIS — R2681 Unsteadiness on feet: Secondary | ICD-10-CM | POA: Diagnosis not present

## 2023-01-20 DIAGNOSIS — R488 Other symbolic dysfunctions: Secondary | ICD-10-CM | POA: Diagnosis not present

## 2023-01-22 ENCOUNTER — Other Ambulatory Visit (HOSPITAL_COMMUNITY): Payer: Self-pay

## 2023-01-24 ENCOUNTER — Other Ambulatory Visit (HOSPITAL_COMMUNITY): Payer: Self-pay

## 2023-01-24 NOTE — Progress Notes (Unsigned)
No chief complaint on file.   HPI: Susan Ford 89 y.o. come in for FU hospital for  rx of PE 9 3 and then "melena"  9/14 not observed on anticoagulation and stable hg.   Hx of gastric ulcer and bleed  3/ 24  ROS: See pertinent positives and negatives per HPI.  Past Medical History:  Diagnosis Date   Arthritis    Cataract 2007   bilateral cat ext    Cholecystitis    Complication of anesthesia Feb 21, 2014   atrial fib after cholecystectomy, lasted a few hours, none since   Dysrhythmia 02-21-2014   after cholecystectomy had atrial fib for a few hours, none since   GERD (gastroesophageal reflux disease)    Had endoscopy no history of Barrett's   Hx of nonmelanoma skin cancer    Unspecified hereditary and idiopathic peripheral neuropathy     Family History  Problem Relation Age of Onset   Neurodegenerative disease Mother        Shy Drager died at 73   Alcohol abuse Other    Heart disease Other     Social History   Socioeconomic History   Marital status: Widowed    Spouse name: Not on file   Number of children: 3   Years of education: 15 years   Highest education level: Associate degree: occupational, Scientist, product/process development, or vocational program  Occupational History   Occupation: Retired    Comment: Charity fundraiser  Tobacco Use   Smoking status: Former    Current packs/day: 0.00    Average packs/day: 0.3 packs/day for 7.0 years (1.8 ttl pk-yrs)    Types: Cigarettes    Start date: 04/26/1956    Quit date: 04/27/1963    Years since quitting: 59.7   Smokeless tobacco: Never  Substance and Sexual Activity   Alcohol use: Yes    Alcohol/week: 7.0 standard drinks of alcohol    Types: 7 Glasses of wine per week    Comment: 1 glass of wine daily   Drug use: No    Types: Hydrocodone   Sexual activity: Yes  Other Topics Concern   Not on file  Social History Narrative   Retired Charity fundraiser    widowed since    Spring  15    [facility] of 1   Regular exercise former smoker   G3P3   From [place]  originally      05/12/2018:   Lives alone on ranch house, but has stairs to garage with "acorn chair" lift    Son in [place], and two daughters out of state, all of whom are supportive    Worked in NICU, 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          Social Determinants of Health   Financial Resource Strain: Low Risk  (06/04/2022)   Overall Financial Resource Strain (CARDIA)    Difficulty of Paying Living Expenses: Not hard at all  Food Insecurity: No Food Insecurity (12/28/2022)   Hunger Vital Sign    Worried About Running Out of Food in the Last Year: Never true    Ran Out of Food in the Last Year: Never true  Transportation Needs: No Transportation Needs (12/28/2022)   PRAPARE - Administrator, Civil Service (Medical): No    Lack of Transportation (Non-Medical): No  Physical Activity: Inactive (06/04/2022)   Exercise Vital Sign    Days of Exercise per Week: 0 days    Minutes  of Exercise per Session: 0 min  Stress: No Stress Concern Present (06/04/2022)   [employer] of Occupational Health - Occupational Stress Questionnaire    Feeling of Stress : Not at all  Social Connections: Moderately Integrated (06/04/2022)   Social Connection and Isolation Panel [NHANES]    Frequency of Communication with Friends and Family: More than three times a week    Frequency of Social Gatherings with Friends and Family: More than three times a week    Attends Religious Services: More than 4 times per year    Active Member of [employer] or Organizations: Yes    Attends Banker Meetings: More than 4 times per year    Marital Status: Widowed    Outpatient Medications Prior to Visit  Medication Sig Dispense Refill   acetaminophen (TYLENOL) 500 MG tablet Take 500 mg by mouth every 6 (six) hours as needed for mild pain.      apixaban (ELIQUIS) 5 MG TABS tablet Take 2 tablets (10 mg total) by mouth 2 (two) times daily for 3 days, THEN 1  tablet (5 mg total) 2 (two) times daily. 60 tablet 0   Apoaequorin (PREVAGEN) 10 MG CAPS Take 10 mg by mouth daily.     estradiol (CLIMARA - DOSED IN MG/24 HR) 0.025 mg/24hr patch PLACE 1 PATCH (0.025 MG TOTAL) ONTO THE SKIN ONCE A WEEK ON SATURDAY 12 patch 1   gabapentin (NEURONTIN) 300 MG capsule Take 1 capsule (300 mg total) by mouth 4 (four) times daily. 120 capsule 3   hydrocortisone cream 1 % Apply topically 2 (two) times daily as needed for itching. 28 g 0   Multiple Vitamin (MULTIVITAMIN) tablet Take 1 tablet by mouth daily after lunch.     polyethylene glycol (MIRALAX / GLYCOLAX) 17 g packet Take 17 g by mouth daily. 14 each 0   senna (SENOKOT) 8.6 MG TABS tablet Take 1 tablet (8.6 mg total) by mouth daily. 120 tablet 0   simvastatin (ZOCOR) 10 MG tablet Take 1 tablet (10 mg total) by mouth daily. 90 tablet 1   No facility-administered medications prior to visit.     EXAM:  There were no vitals taken for this visit.  There is no height or weight on file to calculate BMI.  GENERAL: vitals reviewed and listed above, alert, oriented, appears well hydrated and in no acute distress HEENT: atraumatic, conjunctiva  clear, no obvious abnormalities on inspection of external nose and ears  NECK: no obvious masses on inspection palpation  LUNGS: clear to auscultation bilaterally, no wheezes, rales or rhonchi, good air movement CV: HRRR, no clubbing cyanosis or  peripheral edema nl cap refill  MS: moves all extremities without noticeable focal  abnormality PSYCH: pleasant and cooperative, no obvious depression or anxiety Lab Results  Component Value Date   WBC 7.9 01/08/2023   HGB 12.1 01/08/2023   HCT 37.3 01/08/2023   PLT 287 01/08/2023   GLUCOSE 107 (H) 01/08/2023   CHOL 150 05/05/2021   TRIG 191.0 (H) 05/05/2021   HDL 64.30 05/05/2021   LDLDIRECT 153 02/12/2009   LDLCALC 48 05/05/2021   ALT 15 01/08/2023   AST 18 01/08/2023   NA 133 (L) 01/08/2023   K 4.5 01/08/2023   CL  100 01/08/2023   CREATININE 0.64 01/08/2023   BUN 23 01/08/2023   CO2 24 01/08/2023   TSH 0.70 08/25/2022   INR 0.9 07/09/2022   HGBA1C 5.5 05/05/2021   BP Readings from Last 3 Encounters:  01/08/23 Marland Kitchen)  158/80  01/02/23 111/63  12/15/22 135/88    ASSESSMENT AND PLAN:  Discussed the following assessment and plan:  Hospital discharge follow-up  Anticoagulant long-term use  -Patient advised to return or notify health care team  if  new concerns arise.  There are no Patient Instructions on file for this visit.   Neta Mends. Lowana Hable M.D.

## 2023-01-25 ENCOUNTER — Ambulatory Visit (INDEPENDENT_AMBULATORY_CARE_PROVIDER_SITE_OTHER): Payer: Medicare Other | Admitting: Internal Medicine

## 2023-01-25 ENCOUNTER — Encounter: Payer: Self-pay | Admitting: Internal Medicine

## 2023-01-25 VITALS — BP 106/70 | HR 104 | Temp 98.0°F | Ht 64.0 in | Wt 122.4 lb

## 2023-01-25 DIAGNOSIS — I2699 Other pulmonary embolism without acute cor pulmonale: Secondary | ICD-10-CM

## 2023-01-25 DIAGNOSIS — L299 Pruritus, unspecified: Secondary | ICD-10-CM | POA: Diagnosis not present

## 2023-01-25 DIAGNOSIS — Z79899 Other long term (current) drug therapy: Secondary | ICD-10-CM

## 2023-01-25 DIAGNOSIS — R54 Age-related physical debility: Secondary | ICD-10-CM

## 2023-01-25 DIAGNOSIS — R488 Other symbolic dysfunctions: Secondary | ICD-10-CM | POA: Diagnosis not present

## 2023-01-25 DIAGNOSIS — R2681 Unsteadiness on feet: Secondary | ICD-10-CM | POA: Diagnosis not present

## 2023-01-25 DIAGNOSIS — Z09 Encounter for follow-up examination after completed treatment for conditions other than malignant neoplasm: Secondary | ICD-10-CM

## 2023-01-25 DIAGNOSIS — Z7901 Long term (current) use of anticoagulants: Secondary | ICD-10-CM | POA: Diagnosis not present

## 2023-01-25 LAB — CBC WITH DIFFERENTIAL/PLATELET
Basophils Absolute: 0.1 10*3/uL (ref 0.0–0.1)
Basophils Relative: 1.2 % (ref 0.0–3.0)
Eosinophils Absolute: 0.4 10*3/uL (ref 0.0–0.7)
Eosinophils Relative: 5.8 % — ABNORMAL HIGH (ref 0.0–5.0)
HCT: 34.3 % — ABNORMAL LOW (ref 36.0–46.0)
Hemoglobin: 11.5 g/dL — ABNORMAL LOW (ref 12.0–15.0)
Lymphocytes Relative: 20.2 % (ref 12.0–46.0)
Lymphs Abs: 1.3 10*3/uL (ref 0.7–4.0)
MCHC: 33.6 g/dL (ref 30.0–36.0)
MCV: 94.4 fL (ref 78.0–100.0)
Monocytes Absolute: 0.5 10*3/uL (ref 0.1–1.0)
Monocytes Relative: 7.9 % (ref 3.0–12.0)
Neutro Abs: 4.2 10*3/uL (ref 1.4–7.7)
Neutrophils Relative %: 64.9 % (ref 43.0–77.0)
Platelets: 283 10*3/uL (ref 150.0–400.0)
RBC: 3.63 Mil/uL — ABNORMAL LOW (ref 3.87–5.11)
RDW: 13 % (ref 11.5–15.5)
WBC: 6.5 10*3/uL (ref 4.0–10.5)

## 2023-01-25 LAB — BASIC METABOLIC PANEL
BUN: 6 mg/dL (ref 6–23)
CO2: 28 meq/L (ref 19–32)
Calcium: 9 mg/dL (ref 8.4–10.5)
Chloride: 101 meq/L (ref 96–112)
Creatinine, Ser: 0.72 mg/dL (ref 0.40–1.20)
GFR: 73.02 mL/min (ref >60.00)
Glucose, Bld: 100 mg/dL — ABNORMAL HIGH (ref 70–99)
Potassium: 4.2 meq/L (ref 3.5–5.1)
Sodium: 134 meq/L — ABNORMAL LOW (ref 135–145)

## 2023-01-25 MED ORDER — APIXABAN 5 MG PO TABS: 5.0000 mg | ORAL_TABLET | Freq: Two times a day (BID) | ORAL | 2 refills | Status: DC

## 2023-01-25 NOTE — Assessment & Plan Note (Signed)
Continue eliquis with caution 5 bid ( nl cr)  for total 3 mos  for now and then decrease dose if indicated . Suggest at least 6 mos  since no  obvious cause .

## 2023-01-25 NOTE — Progress Notes (Signed)
Kidney function is normal sodium stable.  Hg 11.5  similar to  a month ago  but down from last note 12.1   will follow up at next visit

## 2023-01-25 NOTE — Patient Instructions (Signed)
Good to see you today . We need to stay on   eliquis for at least 3-6 months assuming no problems with this.  Begin new rx after finishing the bottle  you have .  Fall prevention. Lab today .  Can try  generic Claritin  and may be zyrtec if doesn't cause . Drowsiness.  Not sure otherwise about the itching unless dry skin realted.

## 2023-01-26 DIAGNOSIS — R2689 Other abnormalities of gait and mobility: Secondary | ICD-10-CM | POA: Diagnosis not present

## 2023-01-26 NOTE — Telephone Encounter (Signed)
Pt was seen by MD on 01/25/23. Pt called the pharmacy several times to check on the status of her Eliquis refill.  Pt states the pharmacy is not making any sense and she still does not know if her Rx is ready to be picked up.   apixaban (ELIQUIS) 5 MG TABS tablet  Take 1 tablet (5 mg total) by mouth 2 (two) times daily. For pulmonary embolism treatment, Starting Tue 01/25/2023, Normal  Dispense: 60 tablet  Refills: 2 ordered  Pharmacy: [id number] - , [place] - [address] (Ph: [phone])  Order Details Ordered on: 01/25/23  Authorizing provider: Madelin Headings, MD

## 2023-01-26 NOTE — Telephone Encounter (Signed)
Follow up with pharmacy and spoke to [place].  She states they had to order it for today. pt's Rx is in the process of filling.  Attempted to reach pt. Left a detail message for pt.

## 2023-01-28 DIAGNOSIS — R2689 Other abnormalities of gait and mobility: Secondary | ICD-10-CM | POA: Diagnosis not present

## 2023-01-31 DIAGNOSIS — R2689 Other abnormalities of gait and mobility: Secondary | ICD-10-CM | POA: Diagnosis not present

## 2023-02-01 DIAGNOSIS — R488 Other symbolic dysfunctions: Secondary | ICD-10-CM | POA: Diagnosis not present

## 2023-02-01 DIAGNOSIS — R2681 Unsteadiness on feet: Secondary | ICD-10-CM | POA: Diagnosis not present

## 2023-02-02 DIAGNOSIS — R2689 Other abnormalities of gait and mobility: Secondary | ICD-10-CM | POA: Diagnosis not present

## 2023-02-04 ENCOUNTER — Other Ambulatory Visit (HOSPITAL_COMMUNITY): Payer: Self-pay

## 2023-02-07 DIAGNOSIS — R488 Other symbolic dysfunctions: Secondary | ICD-10-CM | POA: Diagnosis not present

## 2023-02-07 DIAGNOSIS — R2681 Unsteadiness on feet: Secondary | ICD-10-CM | POA: Diagnosis not present

## 2023-02-08 ENCOUNTER — Telehealth: Payer: Self-pay

## 2023-02-08 DIAGNOSIS — R488 Other symbolic dysfunctions: Secondary | ICD-10-CM | POA: Diagnosis not present

## 2023-02-08 DIAGNOSIS — R2681 Unsteadiness on feet: Secondary | ICD-10-CM | POA: Diagnosis not present

## 2023-02-08 NOTE — Telephone Encounter (Signed)
Transition Care Management Unsuccessful Follow-up Telephone Call  Date of discharge and from where:  Susan Ford 9/14  Attempts:  1st Attempt  Reason for unsuccessful TCM follow-up call:  No answer/busy   Susan Ford [place]  [facility], [facility] Guide, Phone: [phone] Website: [email or web address]

## 2023-02-09 ENCOUNTER — Telehealth: Payer: Self-pay

## 2023-02-09 NOTE — Telephone Encounter (Signed)
Transition Care Management Unsuccessful Follow-up Telephone Call  Date of discharge and from where:  Susan [facility] 9/14  Attempts:  2nd Attempt  Reason for unsuccessful TCM follow-up call:  No answer/busy   Susan Ford  [name], [name], Phone: [phone] Website: [email or web address]

## 2023-02-11 ENCOUNTER — Telehealth: Payer: Self-pay

## 2023-02-11 DIAGNOSIS — R2689 Other abnormalities of gait and mobility: Secondary | ICD-10-CM | POA: Diagnosis not present

## 2023-02-11 NOTE — Telephone Encounter (Signed)
Message sent to PCP as  FYI

## 2023-02-14 DIAGNOSIS — R2689 Other abnormalities of gait and mobility: Secondary | ICD-10-CM | POA: Diagnosis not present

## 2023-02-15 DIAGNOSIS — R488 Other symbolic dysfunctions: Secondary | ICD-10-CM | POA: Diagnosis not present

## 2023-02-15 DIAGNOSIS — R2681 Unsteadiness on feet: Secondary | ICD-10-CM | POA: Diagnosis not present

## 2023-02-16 NOTE — Telephone Encounter (Signed)
Contact patient and see how  she is doing ( I dont see an ed visit . In record )

## 2023-02-17 DIAGNOSIS — R2689 Other abnormalities of gait and mobility: Secondary | ICD-10-CM | POA: Diagnosis not present

## 2023-02-17 NOTE — Telephone Encounter (Signed)
Spoke to pt. Pt reports she did not go to ED and states she is doing fine. Please advise.

## 2023-02-20 NOTE — Telephone Encounter (Signed)
Noted  

## 2023-02-21 DIAGNOSIS — R2689 Other abnormalities of gait and mobility: Secondary | ICD-10-CM | POA: Diagnosis not present

## 2023-02-22 DIAGNOSIS — R2681 Unsteadiness on feet: Secondary | ICD-10-CM | POA: Diagnosis not present

## 2023-02-22 DIAGNOSIS — R488 Other symbolic dysfunctions: Secondary | ICD-10-CM | POA: Diagnosis not present

## 2023-02-23 DIAGNOSIS — R2689 Other abnormalities of gait and mobility: Secondary | ICD-10-CM | POA: Diagnosis not present

## 2023-02-24 ENCOUNTER — Telehealth: Payer: Self-pay | Admitting: Internal Medicine

## 2023-02-24 NOTE — Telephone Encounter (Signed)
Pt called to request UTI medication. Pt was offered a visit and said she is at [facility] at [facility] and has no way of getting here.

## 2023-02-24 NOTE — Telephone Encounter (Signed)
Need more information and urinalysis . Maybe she can do a virtual visit  and  then send in urine and culture to lab

## 2023-02-24 NOTE — Telephone Encounter (Signed)
Attempted to reach pt. Left a voicemail to call us back.  

## 2023-02-25 NOTE — Telephone Encounter (Signed)
Attempted to reach pt. Left a detail message to proceed to urgent care for treatment is if still having symptoms.

## 2023-02-28 DIAGNOSIS — R2689 Other abnormalities of gait and mobility: Secondary | ICD-10-CM | POA: Diagnosis not present

## 2023-02-28 NOTE — Telephone Encounter (Signed)
Attempted to call pt. Call cannot be completed. Will try again.

## 2023-03-01 DIAGNOSIS — R2681 Unsteadiness on feet: Secondary | ICD-10-CM | POA: Diagnosis not present

## 2023-03-01 DIAGNOSIS — R488 Other symbolic dysfunctions: Secondary | ICD-10-CM | POA: Diagnosis not present

## 2023-03-02 DIAGNOSIS — R2689 Other abnormalities of gait and mobility: Secondary | ICD-10-CM | POA: Diagnosis not present

## 2023-03-09 DIAGNOSIS — R2681 Unsteadiness on feet: Secondary | ICD-10-CM | POA: Diagnosis not present

## 2023-03-09 DIAGNOSIS — R2689 Other abnormalities of gait and mobility: Secondary | ICD-10-CM | POA: Diagnosis not present

## 2023-03-09 DIAGNOSIS — R488 Other symbolic dysfunctions: Secondary | ICD-10-CM | POA: Diagnosis not present

## 2023-03-10 DIAGNOSIS — R2689 Other abnormalities of gait and mobility: Secondary | ICD-10-CM | POA: Diagnosis not present

## 2023-03-14 DIAGNOSIS — R2689 Other abnormalities of gait and mobility: Secondary | ICD-10-CM | POA: Diagnosis not present

## 2023-03-16 DIAGNOSIS — R488 Other symbolic dysfunctions: Secondary | ICD-10-CM | POA: Diagnosis not present

## 2023-03-16 DIAGNOSIS — R2681 Unsteadiness on feet: Secondary | ICD-10-CM | POA: Diagnosis not present

## 2023-03-18 DIAGNOSIS — R2689 Other abnormalities of gait and mobility: Secondary | ICD-10-CM | POA: Diagnosis not present

## 2023-03-22 DIAGNOSIS — R488 Other symbolic dysfunctions: Secondary | ICD-10-CM | POA: Diagnosis not present

## 2023-03-22 DIAGNOSIS — R2681 Unsteadiness on feet: Secondary | ICD-10-CM | POA: Diagnosis not present

## 2023-03-23 DIAGNOSIS — R2689 Other abnormalities of gait and mobility: Secondary | ICD-10-CM | POA: Diagnosis not present

## 2023-03-29 ENCOUNTER — Ambulatory Visit: Payer: Medicare Other | Admitting: Internal Medicine

## 2023-03-29 NOTE — Progress Notes (Unsigned)
No chief complaint on file.   HPI: Susan Ford 89 y.o. come in for Chronic disease management  ROS: See pertinent positives and negatives per HPI.  Past Medical History:  Diagnosis Date   Arthritis    Cataract 2007   bilateral cat ext    Cholecystitis    Complication of anesthesia Feb 21, 2014   atrial fib after cholecystectomy, lasted a few hours, none since   Dysrhythmia 02-21-2014   after cholecystectomy had atrial fib for a few hours, none since   GERD (gastroesophageal reflux disease)    Had endoscopy no history of Barrett's   Hx of nonmelanoma skin cancer    Unspecified hereditary and idiopathic peripheral neuropathy     Family History  Problem Relation Age of Onset   Neurodegenerative disease Mother        Shy Drager died at 6   Alcohol abuse Other    Heart disease Other     Social History   Socioeconomic History   Marital status: Widowed    Spouse name: Not on file   Number of children: 3   Years of education: 15 years   Highest education level: Associate degree: occupational, Scientist, product/process development, or vocational program  Occupational History   Occupation: Retired    Comment: Charity fundraiser  Tobacco Use   Smoking status: Former    Current packs/day: 0.00    Average packs/day: 0.3 packs/day for 7.0 years (1.8 ttl pk-yrs)    Types: Cigarettes    Start date: 04/26/1956    Quit date: 04/27/1963    Years since quitting: 59.9   Smokeless tobacco: Never  Substance and Sexual Activity   Alcohol use: Yes    Alcohol/week: 7.0 standard drinks of alcohol    Types: 7 Glasses of wine per week    Comment: 1 glass of wine daily   Drug use: No    Types: Hydrocodone   Sexual activity: Yes  Other Topics Concern   Not on file  Social History Narrative   Retired Charity fundraiser    widowed since    Spring  15    [facility] of [facility]   Regular exercise former smoker   G3P3   From [place] originally      05/12/2018:   Lives alone on ranch house, but has stairs to garage with "acorn chair" lift    Son  in [place], and two daughters out of state, all of whom are supportive    Worked in NICU, 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          Social Determinants of Health   Financial Resource Strain: Low Risk  (06/04/2022)   Overall Financial Resource Strain (CARDIA)    Difficulty of Paying Living Expenses: Not hard at all  Food Insecurity: No Food Insecurity (12/28/2022)   Hunger Vital Sign    Worried About Running Out of Food in the Last Year: Never true    Ran Out of Food in the Last Year: Never true  Transportation Needs: No Transportation Needs (12/28/2022)   PRAPARE - Administrator, Civil Service (Medical): No    Lack of Transportation (Non-Medical): No  Physical Activity: Inactive (06/04/2022)   Exercise Vital Sign    Days of Exercise per Week: 0 days    Minutes of Exercise per Session: 0 min  Stress: No Stress Concern Present (06/04/2022)   [employer] of Occupational Health - Occupational Stress Questionnaire    Feeling of  Stress : Not at all  Social Connections: Moderately Integrated (06/04/2022)   Social Connection and Isolation Panel [NHANES]    Frequency of Communication with Friends and Family: More than three times a week    Frequency of Social Gatherings with Friends and Family: More than three times a week    Attends Religious Services: More than 4 times per year    Active Member of [employer] or Organizations: Yes    Attends Banker Meetings: More than 4 times per year    Marital Status: Widowed    Outpatient Medications Prior to Visit  Medication Sig Dispense Refill   acetaminophen (TYLENOL) 500 MG tablet Take 500 mg by mouth every 6 (six) hours as needed for mild pain.      apixaban (ELIQUIS) 5 MG TABS tablet Take 2 tablets (10 mg total) by mouth 2 (two) times daily for 3 days, THEN 1 tablet (5 mg total) 2 (two) times daily. 60 tablet 0   apixaban (ELIQUIS) 5 MG TABS tablet Take 1 tablet (5 mg  total) by mouth 2 (two) times daily. For pulmonary embolism treatment 60 tablet 2   Apoaequorin (PREVAGEN) 10 MG CAPS Take 10 mg by mouth daily.     estradiol (CLIMARA - DOSED IN MG/24 HR) 0.025 mg/24hr patch PLACE 1 PATCH (0.025 MG TOTAL) ONTO THE SKIN ONCE A WEEK ON SATURDAY 12 patch 1   gabapentin (NEURONTIN) 300 MG capsule Take 1 capsule (300 mg total) by mouth 4 (four) times daily. 120 capsule 3   hydrocortisone cream 1 % Apply topically 2 (two) times daily as needed for itching. 28 g 0   Multiple Vitamin (MULTIVITAMIN) tablet Take 1 tablet by mouth daily after lunch.     polyethylene glycol (MIRALAX / GLYCOLAX) 17 g packet Take 17 g by mouth daily. (Patient taking differently: Take 17 g by mouth as needed.) 14 each 0   senna (SENOKOT) 8.6 MG TABS tablet Take 1 tablet (8.6 mg total) by mouth daily. 120 tablet 0   simvastatin (ZOCOR) 10 MG tablet Take 1 tablet (10 mg total) by mouth daily. 90 tablet 1   No facility-administered medications prior to visit.     EXAM:  There were no vitals taken for this visit.  There is no height or weight on file to calculate BMI. Wt Readings from Last 3 Encounters:  01/25/23 122 lb 6.4 oz (55.5 kg)  01/02/23 124 lb 1.9 oz (56.3 kg)  12/15/22 126 lb (57.2 kg)    GENERAL: vitals reviewed and listed above, alert, oriented, appears well hydrated and in no acute distress HEENT: atraumatic, conjunctiva  clear, no obvious abnormalities on inspection of external nose and ears OP : no lesion edema or exudate  NECK: no obvious masses on inspection palpation  LUNGS: clear to auscultation bilaterally, no wheezes, rales or rhonchi, good air movement CV: HRRR, no clubbing cyanosis or  peripheral edema nl cap refill  MS: moves all extremities without noticeable focal  abnormality PSYCH: pleasant and cooperative, no obvious depression or anxiety Lab Results  Component Value Date   WBC 6.5 01/25/2023   HGB 11.5 (L) 01/25/2023   HCT 34.3 (L) 01/25/2023   PLT  283.0 01/25/2023   GLUCOSE 100 (H) 01/25/2023   CHOL 150 05/05/2021   TRIG 191.0 (H) 05/05/2021   HDL 64.30 05/05/2021   LDLDIRECT 153 02/12/2009   LDLCALC 48 05/05/2021   ALT 15 01/08/2023   AST 18 01/08/2023   NA 134 (L) 01/25/2023   K  4.2 01/25/2023   CL 101 01/25/2023   CREATININE 0.72 01/25/2023   BUN 6 01/25/2023   CO2 28 01/25/2023   TSH 0.70 08/25/2022   INR 0.9 07/09/2022   HGBA1C 5.5 05/05/2021   BP Readings from Last 3 Encounters:  01/25/23 106/70  01/08/23 (!) 158/80  01/02/23 111/63    ASSESSMENT AND PLAN:  Discussed the following assessment and plan:  Anticoagulant long-term use  Medication management  Hereditary and idiopathic peripheral neuropathy  Acute pulmonary embolism without acute cor pulmonale, unspecified pulmonary embolism type [facility])  -Patient advised to return or notify health care team  if  new concerns arise.  There are no Patient Instructions on file for this visit.   Neta Mends. Arcadia Gorgas M.D.

## 2023-03-30 ENCOUNTER — Telehealth: Payer: Self-pay | Admitting: Internal Medicine

## 2023-03-30 ENCOUNTER — Ambulatory Visit: Payer: Medicare Other | Admitting: Internal Medicine

## 2023-03-30 ENCOUNTER — Encounter: Payer: Self-pay | Admitting: Internal Medicine

## 2023-03-30 VITALS — BP 126/79 | HR 95 | Temp 98.2°F | Ht 64.0 in | Wt 122.4 lb

## 2023-03-30 DIAGNOSIS — Z79899 Other long term (current) drug therapy: Secondary | ICD-10-CM

## 2023-03-30 DIAGNOSIS — G609 Hereditary and idiopathic neuropathy, unspecified: Secondary | ICD-10-CM

## 2023-03-30 DIAGNOSIS — M19049 Primary osteoarthritis, unspecified hand: Secondary | ICD-10-CM | POA: Diagnosis not present

## 2023-03-30 DIAGNOSIS — Z7901 Long term (current) use of anticoagulants: Secondary | ICD-10-CM

## 2023-03-30 DIAGNOSIS — I2699 Other pulmonary embolism without acute cor pulmonale: Secondary | ICD-10-CM

## 2023-03-30 MED ORDER — APIXABAN 2.5 MG PO TABS: 2.5000 mg | ORAL_TABLET | Freq: Two times a day (BID) | ORAL | 3 refills | Status: DC

## 2023-03-30 NOTE — Telephone Encounter (Signed)
[facility]. Pt is requesting for Eliquis to be sent to: [id number] - [place], [place] - [address] AT [facility] OF [address] & [address]

## 2023-03-30 NOTE — Telephone Encounter (Signed)
Pt would like to know if it will cause any issues if she missed a dose of her Eliquis. She is requesting a call back: [phone].

## 2023-03-30 NOTE — Patient Instructions (Addendum)
Good to see you today . Stay on eliquis  we can decreae the dose to 2.5 mg twice a day  Plan fu visit  in  March  2025 ( 6 months from  pulmonary embolix dx )   Decide on dcontinuation ofblood thinner at that time of other. .  If any bleeding or  worsening breathing  in interim  contact medical team earlier

## 2023-03-30 NOTE — Telephone Encounter (Signed)
Spoke to pt. Please see other phone encounter.

## 2023-03-30 NOTE — Telephone Encounter (Signed)
Spoke to pt. Inform pt, we contacted her pharmacy and her Eliquis is transferred to [facility]. Should be ready to pick up tomorrow and they will send out a notification.   Pt is concerns of missing a dose. Pt states she has enough for today only but not tomorrow. Advise her prescription will be ready tomorrow so she should be fine. Verbalized understanding.

## 2023-04-01 ENCOUNTER — Other Ambulatory Visit: Payer: Self-pay | Admitting: Family

## 2023-04-01 ENCOUNTER — Telehealth: Payer: Medicare Other | Admitting: Family Medicine

## 2023-04-01 ENCOUNTER — Telehealth: Payer: Self-pay | Admitting: Internal Medicine

## 2023-04-01 ENCOUNTER — Other Ambulatory Visit: Payer: Self-pay

## 2023-04-01 MED ORDER — APIXABAN 2.5 MG PO TABS: 2.5000 mg | ORAL_TABLET | Freq: Two times a day (BID) | ORAL | 3 refills | Status: DC

## 2023-04-01 NOTE — Telephone Encounter (Signed)
Attempted to reach pt. Pt is okay to leave urine sample if agree to see Dr. Swaziland at 4:30pm.  Left a voicemail to contact us.

## 2023-04-01 NOTE — Telephone Encounter (Signed)
Pt called in and states she believes she has a UTI. She is having burning when she goes. She would like to come in today at 11am and leave a urine sample. I let pt know we do not have an order in and she would be unable to leave a sample without an order. I also informed pt her PCP is not in the office and no other providers have availability today. I did offer to check other sites for availability, however pt declined, stating she uses transportation and they can only take her to [facility].   Pt asked for someone to place an order for her so she can come in at 11 to leave her sample. I informed pt I would send back message, but let her know she cannot leave a sample without an order.   I informed pt someone would reach out to her about order being placed.

## 2023-04-01 NOTE — Telephone Encounter (Signed)
Pt was given the below message and pt decline to have an appt with dr Swaziland at 430 pm

## 2023-04-01 NOTE — Telephone Encounter (Signed)
Pt called to say she contacted  [id number] - [place], [place] - [address] AT [facility] OF [address] Phone: [phone]  Fax: [phone]    And was told they do not have her Rx.

## 2023-04-01 NOTE — Telephone Encounter (Signed)
Contacted to follow up on Eliquis 2.5mg  that was suppose to be [facility] by [facility].   Spoke to Mo. She states they didn't received it and would be better to send in new Rx.  New Rx sent in.

## 2023-04-10 ENCOUNTER — Emergency Department (HOSPITAL_COMMUNITY): Payer: Medicare Other

## 2023-04-10 ENCOUNTER — Other Ambulatory Visit: Payer: Self-pay

## 2023-04-10 ENCOUNTER — Encounter (HOSPITAL_COMMUNITY): Payer: Self-pay

## 2023-04-10 ENCOUNTER — Emergency Department (HOSPITAL_COMMUNITY)
Admission: EM | Admit: 2023-04-10 | Discharge: 2023-04-10 | Disposition: A | Discharge status: Home / Self Care | Payer: Medicare Other | Source: Home / Self Care | Attending: Emergency Medicine | Admitting: Emergency Medicine

## 2023-04-10 DIAGNOSIS — K219 Gastro-esophageal reflux disease without esophagitis: Secondary | ICD-10-CM | POA: Diagnosis not present

## 2023-04-10 DIAGNOSIS — D62 Acute posthemorrhagic anemia: Secondary | ICD-10-CM | POA: Diagnosis not present

## 2023-04-10 DIAGNOSIS — Z8711 Personal history of peptic ulcer disease: Secondary | ICD-10-CM | POA: Diagnosis not present

## 2023-04-10 DIAGNOSIS — D500 Iron deficiency anemia secondary to blood loss (chronic): Secondary | ICD-10-CM | POA: Diagnosis not present

## 2023-04-10 DIAGNOSIS — Z888 Allergy status to other drugs, medicaments and biological substances status: Secondary | ICD-10-CM | POA: Diagnosis not present

## 2023-04-10 DIAGNOSIS — R000 Tachycardia, unspecified: Secondary | ICD-10-CM | POA: Diagnosis not present

## 2023-04-10 DIAGNOSIS — Z20822 Contact with and (suspected) exposure to covid-19: Secondary | ICD-10-CM | POA: Insufficient documentation

## 2023-04-10 DIAGNOSIS — Z85828 Personal history of other malignant neoplasm of skin: Secondary | ICD-10-CM | POA: Diagnosis not present

## 2023-04-10 DIAGNOSIS — Z86711 Personal history of pulmonary embolism: Secondary | ICD-10-CM | POA: Diagnosis not present

## 2023-04-10 DIAGNOSIS — K449 Diaphragmatic hernia without obstruction or gangrene: Secondary | ICD-10-CM | POA: Diagnosis not present

## 2023-04-10 DIAGNOSIS — N390 Urinary tract infection, site not specified: Secondary | ICD-10-CM | POA: Diagnosis not present

## 2023-04-10 DIAGNOSIS — R112 Nausea with vomiting, unspecified: Secondary | ICD-10-CM | POA: Insufficient documentation

## 2023-04-10 DIAGNOSIS — K921 Melena: Secondary | ICD-10-CM | POA: Diagnosis not present

## 2023-04-10 DIAGNOSIS — E785 Hyperlipidemia, unspecified: Secondary | ICD-10-CM | POA: Diagnosis not present

## 2023-04-10 DIAGNOSIS — E860 Dehydration: Secondary | ICD-10-CM | POA: Diagnosis not present

## 2023-04-10 DIAGNOSIS — I4891 Unspecified atrial fibrillation: Secondary | ICD-10-CM | POA: Diagnosis not present

## 2023-04-10 DIAGNOSIS — R58 Hemorrhage, not elsewhere classified: Secondary | ICD-10-CM | POA: Diagnosis not present

## 2023-04-10 DIAGNOSIS — Z7401 Bed confinement status: Secondary | ICD-10-CM | POA: Diagnosis not present

## 2023-04-10 DIAGNOSIS — Z7901 Long term (current) use of anticoagulants: Secondary | ICD-10-CM | POA: Diagnosis not present

## 2023-04-10 DIAGNOSIS — I469 Cardiac arrest, cause unspecified: Secondary | ICD-10-CM | POA: Diagnosis not present

## 2023-04-10 DIAGNOSIS — K5710 Diverticulosis of small intestine without perforation or abscess without bleeding: Secondary | ICD-10-CM | POA: Diagnosis not present

## 2023-04-10 DIAGNOSIS — K5730 Diverticulosis of large intestine without perforation or abscess without bleeding: Secondary | ICD-10-CM | POA: Diagnosis not present

## 2023-04-10 DIAGNOSIS — G309 Alzheimer's disease, unspecified: Secondary | ICD-10-CM | POA: Diagnosis not present

## 2023-04-10 DIAGNOSIS — H9190 Unspecified hearing loss, unspecified ear: Secondary | ICD-10-CM | POA: Diagnosis not present

## 2023-04-10 DIAGNOSIS — R109 Unspecified abdominal pain: Secondary | ICD-10-CM | POA: Diagnosis not present

## 2023-04-10 DIAGNOSIS — K922 Gastrointestinal hemorrhage, unspecified: Secondary | ICD-10-CM | POA: Diagnosis not present

## 2023-04-10 DIAGNOSIS — R531 Weakness: Secondary | ICD-10-CM | POA: Diagnosis not present

## 2023-04-10 DIAGNOSIS — K254 Chronic or unspecified gastric ulcer with hemorrhage: Secondary | ICD-10-CM | POA: Diagnosis not present

## 2023-04-10 DIAGNOSIS — F0280 Dementia in other diseases classified elsewhere without behavioral disturbance: Secondary | ICD-10-CM | POA: Diagnosis not present

## 2023-04-10 DIAGNOSIS — K259 Gastric ulcer, unspecified as acute or chronic, without hemorrhage or perforation: Secondary | ICD-10-CM | POA: Diagnosis not present

## 2023-04-10 DIAGNOSIS — Z79899 Other long term (current) drug therapy: Secondary | ICD-10-CM | POA: Diagnosis not present

## 2023-04-10 DIAGNOSIS — Z90710 Acquired absence of both cervix and uterus: Secondary | ICD-10-CM | POA: Diagnosis not present

## 2023-04-10 DIAGNOSIS — E876 Hypokalemia: Secondary | ICD-10-CM | POA: Diagnosis not present

## 2023-04-10 DIAGNOSIS — Z881 Allergy status to other antibiotic agents status: Secondary | ICD-10-CM | POA: Diagnosis not present

## 2023-04-10 DIAGNOSIS — G629 Polyneuropathy, unspecified: Secondary | ICD-10-CM | POA: Diagnosis not present

## 2023-04-10 DIAGNOSIS — Z87891 Personal history of nicotine dependence: Secondary | ICD-10-CM | POA: Diagnosis not present

## 2023-04-10 DIAGNOSIS — R1111 Vomiting without nausea: Secondary | ICD-10-CM | POA: Diagnosis not present

## 2023-04-10 DIAGNOSIS — K319 Disease of stomach and duodenum, unspecified: Secondary | ICD-10-CM | POA: Diagnosis not present

## 2023-04-10 LAB — CBC WITH DIFFERENTIAL/PLATELET
Abs Immature Granulocytes: 0.03 10*3/uL (ref 0.00–0.07)
Basophils Absolute: 0.1 10*3/uL (ref 0.0–0.1)
Basophils Relative: 0 %
Eosinophils Absolute: 0 10*3/uL (ref 0.0–0.5)
Eosinophils Relative: 0 %
HCT: 37.8 % (ref 36.0–46.0)
Hemoglobin: 11.8 g/dL — ABNORMAL LOW (ref 12.0–15.0)
Immature Granulocytes: 0 %
Lymphocytes Relative: 9 %
Lymphs Abs: 1 10*3/uL (ref 0.7–4.0)
MCH: 30.3 pg (ref 26.0–34.0)
MCHC: 31.2 g/dL (ref 30.0–36.0)
MCV: 96.9 fL (ref 80.0–100.0)
Monocytes Absolute: 0.5 10*3/uL (ref 0.1–1.0)
Monocytes Relative: 4 %
Neutro Abs: 9.6 10*3/uL — ABNORMAL HIGH (ref 1.7–7.7)
Neutrophils Relative %: 87 %
Platelets: 265 10*3/uL (ref 150–400)
RBC: 3.9 MIL/uL (ref 3.87–5.11)
RDW: 12.4 % (ref 11.5–15.5)
WBC: 11.2 10*3/uL — ABNORMAL HIGH (ref 4.0–10.5)
nRBC: 0 % (ref 0.0–0.2)

## 2023-04-10 LAB — COMPREHENSIVE METABOLIC PANEL
ALT: 16 U/L (ref 0–44)
AST: 24 U/L (ref 15–41)
Albumin: 3.2 g/dL — ABNORMAL LOW (ref 3.5–5.0)
Alkaline Phosphatase: 52 U/L (ref 38–126)
Anion gap: 8 (ref 5–15)
BUN: 35 mg/dL — ABNORMAL HIGH (ref 8–23)
CO2: 26 mmol/L (ref 22–32)
Calcium: 9.3 mg/dL (ref 8.9–10.3)
Chloride: 99 mmol/L (ref 98–111)
Creatinine, Ser: 0.71 mg/dL (ref 0.44–1.00)
GFR, Estimated: >60 mL/min (ref >60)
Glucose, Bld: 133 mg/dL — ABNORMAL HIGH (ref 70–99)
Potassium: 4.9 mmol/L (ref 3.5–5.1)
Sodium: 133 mmol/L — ABNORMAL LOW (ref 135–145)
Total Bilirubin: 1.2 mg/dL — ABNORMAL HIGH (ref <1.2)
Total Protein: 6.2 g/dL — ABNORMAL LOW (ref 6.5–8.1)

## 2023-04-10 LAB — RESP PANEL BY RT-PCR (RSV, FLU A&B, COVID)  RVPGX2
Influenza A by PCR: NEGATIVE
Influenza B by PCR: NEGATIVE
Resp Syncytial Virus by PCR: NEGATIVE
SARS Coronavirus 2 by RT PCR: NEGATIVE

## 2023-04-10 LAB — LIPASE, BLOOD: Lipase: 27 U/L (ref 11–51)

## 2023-04-10 LAB — URINALYSIS, ROUTINE W REFLEX MICROSCOPIC
Bilirubin Urine: NEGATIVE
Glucose, UA: NEGATIVE mg/dL
Hgb urine dipstick: NEGATIVE
Ketones, ur: 20 mg/dL — AB
Nitrite: NEGATIVE
Protein, ur: NEGATIVE mg/dL
Specific Gravity, Urine: >1.046 — ABNORMAL HIGH (ref 1.005–1.030)
pH: 8 (ref 5.0–8.0)

## 2023-04-10 MED ORDER — ONDANSETRON 4 MG PO TBDP
4.0000 mg | ORAL_TABLET | Freq: Three times a day (TID) | ORAL | 0 refills | Status: DC | PRN
Start: 2023-04-10 — End: 2023-07-06

## 2023-04-10 MED ORDER — ONDANSETRON HCL 4 MG/2ML IJ SOLN
4.0000 mg | Freq: Once | INTRAMUSCULAR | Status: AC
  Administered 2023-04-10: 4 mg via INTRAVENOUS
  Filled 2023-04-10: qty 2

## 2023-04-10 MED ORDER — IOHEXOL 300 MG/ML  SOLN
100.0000 mL | Freq: Once | INTRAMUSCULAR | Status: AC | PRN
  Administered 2023-04-10: 100 mL via INTRAVENOUS

## 2023-04-10 MED ORDER — SODIUM CHLORIDE 0.9 % IV BOLUS
1000.0000 mL | Freq: Once | INTRAVENOUS | Status: AC
  Administered 2023-04-10: 1000 mL via INTRAVENOUS

## 2023-04-10 NOTE — ED Notes (Signed)
RN called [facility] at [facility] s/w Lissa Hoard and gave report informing her pt. was D/C and on her way back to facility.

## 2023-04-10 NOTE — ED Provider Notes (Signed)
[facility] EMERGENCY DEPARTMENT AT [facility] Provider Note   CSN: [id number] Arrival date & time: 04/10/23  1517     History  Chief Complaint  Patient presents with   Nausea    Susan Ford is a 89 y.o. female.  Pt is a 89 yo female with pmhx significant for PE (on Eliquis), GERD, and skin cancer.  Pt lives at [facility] and awoke this am with n/v.  Susan Ford does not remember that, however.  Susan Ford denies any sx now.         Home Medications Prior to Admission medications   Medication Sig Start Date End Date Taking? Authorizing Provider  acetaminophen (TYLENOL) 325 MG tablet Take 650 mg by mouth every 6 (six) hours as needed for moderate pain (pain score 4-6) or mild pain (pain score 1-3).   Yes [provider]  apixaban (ELIQUIS) 2.5 MG TABS tablet Take 1 tablet (2.5 mg total) by mouth 2 (two) times daily. For pulmonary embolism treatment prevention decrease dose. 04/01/23  Yes Panosh, Neta Mends, MD  Apoaequorin (PREVAGEN) 10 MG CAPS Take 10 mg by mouth daily.   Yes [provider]  Ascorbic Acid (VITAMIN C) 1000 MG tablet Take 1,000 mg by mouth daily.   Yes [provider]  estradiol (CLIMARA - DOSED IN MG/24 HR) 0.025 mg/24hr patch PLACE 1 PATCH (0.025 MG TOTAL) ONTO THE SKIN ONCE A WEEK ON SATURDAY Patient taking differently: Place 0.025 mg onto the skin once a week. Sundays 01/13/23  Yes Panosh, Neta Mends, MD  gabapentin (NEURONTIN) 300 MG capsule Take 1 capsule (300 mg total) by mouth 4 (four) times daily. 11/03/22  Yes Panosh, Neta Mends, MD  Multiple Vitamin (MULTIVITAMIN) tablet Take 1 tablet by mouth daily after lunch.   Yes [provider]  ondansetron (ZOFRAN-ODT) 4 MG disintegrating tablet Take 1 tablet (4 mg total) by mouth every 8 (eight) hours as needed. 04/10/23  Yes Jacalyn Lefevre, MD  polyethylene glycol (MIRALAX / GLYCOLAX) 17 g packet Take 17 g by mouth daily. Patient taking differently: Take 17 g by mouth as needed  for moderate constipation or mild constipation. 01/02/23  Yes Lanae Boast, MD  senna (SENOKOT) 8.6 MG TABS tablet Take 1 tablet (8.6 mg total) by mouth daily. Patient taking differently: Take 2 tablets by mouth every other day. 01/02/23  Yes Lanae Boast, MD  hydrocortisone cream 1 % Apply topically 2 (two) times daily as needed for itching. Patient not taking: Reported on 04/10/2023 01/01/23   Lanae Boast, MD  loratadine (ALLERGY RELIEF) 10 MG tablet Take 10 mg by mouth every evening. Patient not taking: Reported on 04/10/2023    [provider]  simvastatin (ZOCOR) 10 MG tablet Take 1 tablet (10 mg total) by mouth daily. 12/20/22   Panosh, Neta Mends, MD      Allergies    Keflex [cephalexin], Lyrica [pregabalin], Oxytetracycline, and Memantine    Review of Systems   Review of Systems  Gastrointestinal:  Positive for abdominal pain, nausea and vomiting.  All other systems reviewed and are negative.   Physical Exam Updated Vital Signs BP 137/84   Pulse (!) 115   Temp 98.9 F (37.2 C) (Oral)   Resp (!) 22   Ht 5\' 4"  (1.626 m)   Wt 55.3 kg   SpO2 96%   BMI 20.94 kg/m  Physical Exam Vitals and nursing note reviewed.  Constitutional:      Appearance: Normal appearance.  HENT:  Head: Normocephalic and atraumatic.     Right Ear: External ear normal.     Left Ear: External ear normal.     Nose: Nose normal.     Mouth/Throat:     Mouth: Mucous membranes are dry.  Eyes:     Extraocular Movements: Extraocular movements intact.     Conjunctiva/sclera: Conjunctivae normal.     Pupils: Pupils are equal, round, and reactive to light.  Cardiovascular:     Rate and Rhythm: Regular rhythm. Tachycardia present.     Pulses: Normal pulses.     Heart sounds: Normal heart sounds.  Pulmonary:     Effort: Pulmonary effort is normal.     Breath sounds: Normal breath sounds.  Abdominal:     General: Abdomen is flat. Bowel sounds are normal.     Palpations: Abdomen is soft.     Musculoskeletal:        General: Normal range of motion.     Cervical back: Normal range of motion and neck supple.  Skin:    General: Skin is warm.     Capillary Refill: Capillary refill takes less than 2 seconds.  Neurological:     General: No focal deficit present.     Mental Status: Susan Ford is alert and oriented to person, place, and time.  Psychiatric:        Mood and Affect: Mood normal.        Behavior: Behavior normal.     ED Results / Procedures / Treatments   Labs (all labs ordered are listed, but only abnormal results are displayed) Labs Reviewed  CBC WITH DIFFERENTIAL/PLATELET - Abnormal; Notable for the following components:      Result Value   WBC 11.2 (*)    Hemoglobin 11.8 (*)    Neutro Abs 9.6 (*)    All other components within normal limits  COMPREHENSIVE METABOLIC PANEL - Abnormal; Notable for the following components:   Sodium 133 (*)    Glucose, Bld 133 (*)    BUN 35 (*)    Total Protein 6.2 (*)    Albumin 3.2 (*)    Total Bilirubin 1.2 (*)    All other components within normal limits  URINALYSIS, ROUTINE W REFLEX MICROSCOPIC - Abnormal; Notable for the following components:   Color, Urine STRAW (*)    APPearance HAZY (*)    Specific Gravity, Urine >1.046 (*)    Ketones, ur 20 (*)    Leukocytes,Ua TRACE (*)    Bacteria, UA RARE (*)    All other components within normal limits  RESP PANEL BY RT-PCR (RSV, FLU A&B, COVID)  RVPGX2  LIPASE, BLOOD    EKG EKG Interpretation Date/Time:  Sunday April 10 2023 09:16:30 EST Ventricular Rate:  116 PR Interval:  186 QRS Duration:  94 QT Interval:  344 QTC Calculation: 478 R Axis:   60  Text Interpretation: Sinus tachycardia Low voltage, precordial leads Since last tracing rate faster Confirmed by Jacalyn Lefevre [phone]) on 04/10/2023 9:51:30 AM  Radiology CT ABDOMEN PELVIS W CONTRAST Result Date: 04/10/2023 CLINICAL DATA:  Acute abdominal pain, nausea and vomiting beginning last night. EXAM: CT  ABDOMEN AND PELVIS WITH CONTRAST TECHNIQUE: Multidetector CT imaging of the abdomen and pelvis was performed using the standard protocol following bolus administration of intravenous contrast. RADIATION DOSE REDUCTION: This exam was performed according to the departmental dose-optimization program which includes automated exposure control, adjustment of the mA and/or kV according to patient size and/or use of iterative reconstruction technique. CONTRAST:  OMNIPAQUE IOHEXOL 300 MG/ML  SOLN COMPARISON:  01/08/2023 FINDINGS: Lower Chest: No acute findings. Stable small fat-containing left posterior diaphragmatic hernia incidentally noted. Hepatobiliary: No suspicious hepatic masses identified. Mild diffuse hepatic steatosis again noted. Prior cholecystectomy. No evidence of biliary obstruction. Pancreas:  No mass or inflammatory changes. Spleen: Within normal limits in size and appearance. Adrenals/Urinary Tract: No suspicious masses identified. No evidence of ureteral calculi or hydronephrosis. Stomach/Bowel: Small hiatal hernia seen. No evidence of obstruction, inflammatory process or abnormal fluid collections. Diverticulosis is seen mainly involving the sigmoid colon, however there is no evidence of diverticulitis. A small left lower quadrant spigelian hernia is again seen containing small bowel, however, there is no No evidence of bowel obstruction or strangulation. Vascular/Lymphatic: No pathologically enlarged lymph nodes. No acute vascular findings. Reproductive: Prior hysterectomy noted. Adnexal regions are unremarkable in appearance. Other:  None. Musculoskeletal: No suspicious bone lesions identified. Severe lumbar spine degenerative changes again noted. IMPRESSION: No acute findings. Colonic diverticulosis, without radiographic evidence of diverticulitis. Small hiatal hernia. 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 No evidence of bowel obstruction or strangulation. Mild  hepatic steatosis. Electronically Signed   By: Danae Orleans M.D.   On: 04/10/2023 11:09    Procedures Procedures    Medications Ordered in ED Medications  sodium chloride 0.9 % bolus 1,000 mL (has no administration in time range)  sodium chloride 0.9 % bolus 1,000 mL (0 mLs Intravenous Stopped 04/10/23 1321)  ondansetron (ZOFRAN) injection 4 mg (4 mg Intravenous Given 04/10/23 0943)  iohexol (OMNIPAQUE) 300 MG/ML solution 100 mL (100 mLs Intravenous Contrast Given 04/10/23 1027)    ED Course/ Medical Decision Making/ A&P                                 Medical Decision Making Amount and/or Complexity of Data Reviewed Labs: ordered. Radiology: ordered.  Risk Prescription drug management.   This patient presents to the ED for concern of abd pain/n/v, this involves an extensive number of treatment options, and is a complaint that carries with it a high risk of complications and morbidity.  The differential diagnosis includes sbo, appendicitis, diverticulitis, uti,    Co morbidities that complicate the patient evaluation  PE (on Eliquis), GERD, and skin cancer   Additional history obtained:  Additional history obtained from epic chart review External records from outside source obtained and reviewed including EMS report   Lab Tests:  I Ordered, and personally interpreted labs.  The pertinent results include:  cbc nl, cmp nl, lip nl, covid/flu/rsv neg, ua + ketones   Imaging Studies ordered:  I ordered imaging studies including ct abd/pelvis  I independently visualized and interpreted imaging which showed  No acute findings.    Colonic diverticulosis, without radiographic evidence of  diverticulitis.    Small hiatal hernia.    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  No evidence of bowel obstruction or strangulation.    Mild hepatic steatosis.   I agree with the radiologist interpretation   Cardiac Monitoring:  The patient was  maintained on a cardiac monitor.  I personally viewed and interpreted the cardiac monitored which showed an underlying rhythm of: st   Medicines ordered and prescription drug management:  I ordered medication including ivfs/zofran  for sx  Reevaluation of the patient after these medicines showed that the patient improved I have reviewed the patients home medicines and have made adjustments  as needed   Test Considered:  ct   Critical Interventions:  Ivfs/zofran   Problem List / ED Course:  N/v and dehydration:  pt is much improved.  Susan Ford is tolerating po fluids.  Labs without anything acute.  Susan Ford is stable for d/c.  Return if worse.    Reevaluation:  After the interventions noted above, I reevaluated the patient and found that they have :improved   Social Determinants of Health:  Lives in [place]   Dispostion:  After consideration of the diagnostic results and the patients response to treatment, I feel that the patent would benefit from discharge with outpatient f/u.          Final Clinical Impression(s) / ED Diagnoses Final diagnoses:  Dehydration  Nausea and vomiting, unspecified vomiting type    Rx / DC Orders ED Discharge Orders          Ordered    ondansetron (ZOFRAN-ODT) 4 MG disintegrating tablet  Every 8 hours PRN        04/10/23 1340              Jacalyn Lefevre, MD 04/10/23 1342

## 2023-04-10 NOTE — ED Triage Notes (Signed)
Pt. BIB EMS from assisted living facility. A&O x 3. Per EMS pt. Was awakened with N/V that smelled and looked of fecal matter.  Pt. Had nothing to eat this morning. LBM last night but does not go often.  BP:128/68 P:98 SPO2: 99% RA CBG 169

## 2023-04-10 NOTE — ED Notes (Addendum)
Lunch tray given to patient, tolerated well denied N/V

## 2023-04-10 NOTE — ED Notes (Signed)
[facility] called to have patient transported back to [facility] at [facility].

## 2023-04-10 NOTE — ED Notes (Signed)
Patient transported to CT 

## 2023-04-11 ENCOUNTER — Encounter (HOSPITAL_COMMUNITY): Payer: Self-pay

## 2023-04-11 ENCOUNTER — Inpatient Hospital Stay (HOSPITAL_COMMUNITY)
Admission: EM | Admit: 2023-04-11 | Discharge: 2023-04-15 | DRG: 811 | Disposition: A | Discharge status: Home Health | Payer: Medicare Other | Attending: Family Medicine | Admitting: Family Medicine

## 2023-04-11 ENCOUNTER — Other Ambulatory Visit: Payer: Self-pay

## 2023-04-11 DIAGNOSIS — G629 Polyneuropathy, unspecified: Secondary | ICD-10-CM

## 2023-04-11 DIAGNOSIS — G309 Alzheimer's disease, unspecified: Secondary | ICD-10-CM | POA: Diagnosis present

## 2023-04-11 DIAGNOSIS — H9190 Unspecified hearing loss, unspecified ear: Secondary | ICD-10-CM | POA: Diagnosis present

## 2023-04-11 DIAGNOSIS — K254 Chronic or unspecified gastric ulcer with hemorrhage: Principal | ICD-10-CM | POA: Diagnosis present

## 2023-04-11 DIAGNOSIS — E876 Hypokalemia: Secondary | ICD-10-CM | POA: Diagnosis not present

## 2023-04-11 DIAGNOSIS — D62 Acute posthemorrhagic anemia: Principal | ICD-10-CM | POA: Diagnosis present

## 2023-04-11 DIAGNOSIS — Z881 Allergy status to other antibiotic agents status: Secondary | ICD-10-CM

## 2023-04-11 DIAGNOSIS — K921 Melena: Secondary | ICD-10-CM | POA: Diagnosis present

## 2023-04-11 DIAGNOSIS — Z79899 Other long term (current) drug therapy: Secondary | ICD-10-CM

## 2023-04-11 DIAGNOSIS — K922 Gastrointestinal hemorrhage, unspecified: Principal | ICD-10-CM

## 2023-04-11 DIAGNOSIS — K5710 Diverticulosis of small intestine without perforation or abscess without bleeding: Secondary | ICD-10-CM | POA: Diagnosis present

## 2023-04-11 DIAGNOSIS — N390 Urinary tract infection, site not specified: Secondary | ICD-10-CM

## 2023-04-11 DIAGNOSIS — Z85828 Personal history of other malignant neoplasm of skin: Secondary | ICD-10-CM

## 2023-04-11 DIAGNOSIS — Z86711 Personal history of pulmonary embolism: Secondary | ICD-10-CM

## 2023-04-11 DIAGNOSIS — F0280 Dementia in other diseases classified elsewhere without behavioral disturbance: Secondary | ICD-10-CM | POA: Diagnosis present

## 2023-04-11 DIAGNOSIS — E860 Dehydration: Secondary | ICD-10-CM | POA: Diagnosis present

## 2023-04-11 DIAGNOSIS — Z8711 Personal history of peptic ulcer disease: Secondary | ICD-10-CM

## 2023-04-11 DIAGNOSIS — K219 Gastro-esophageal reflux disease without esophagitis: Secondary | ICD-10-CM | POA: Diagnosis present

## 2023-04-11 DIAGNOSIS — Z7901 Long term (current) use of anticoagulants: Secondary | ICD-10-CM

## 2023-04-11 DIAGNOSIS — Z90710 Acquired absence of both cervix and uterus: Secondary | ICD-10-CM

## 2023-04-11 DIAGNOSIS — F0390 Unspecified dementia without behavioral disturbance: Secondary | ICD-10-CM | POA: Diagnosis present

## 2023-04-11 DIAGNOSIS — Z87891 Personal history of nicotine dependence: Secondary | ICD-10-CM

## 2023-04-11 DIAGNOSIS — Z888 Allergy status to other drugs, medicaments and biological substances status: Secondary | ICD-10-CM

## 2023-04-11 DIAGNOSIS — R531 Weakness: Secondary | ICD-10-CM | POA: Diagnosis not present

## 2023-04-11 DIAGNOSIS — I4891 Unspecified atrial fibrillation: Secondary | ICD-10-CM | POA: Diagnosis present

## 2023-04-11 LAB — URINALYSIS, ROUTINE W REFLEX MICROSCOPIC
Bilirubin Urine: NEGATIVE
Glucose, UA: NEGATIVE mg/dL
Hgb urine dipstick: NEGATIVE
Ketones, ur: 20 mg/dL — AB
Nitrite: NEGATIVE
Protein, ur: NEGATIVE mg/dL
Specific Gravity, Urine: 1.019 (ref 1.005–1.030)
WBC, UA: >50 WBC/hpf (ref 0–5)
pH: 7 (ref 5.0–8.0)

## 2023-04-11 LAB — CBC WITH DIFFERENTIAL/PLATELET
Abs Immature Granulocytes: 0.03 10*3/uL (ref 0.00–0.07)
Basophils Absolute: 0.1 10*3/uL (ref 0.0–0.1)
Basophils Relative: 1 %
Eosinophils Absolute: 0 10*3/uL (ref 0.0–0.5)
Eosinophils Relative: 0 %
HCT: 27.1 % — ABNORMAL LOW (ref 36.0–46.0)
Hemoglobin: 9.1 g/dL — ABNORMAL LOW (ref 12.0–15.0)
Immature Granulocytes: 0 %
Lymphocytes Relative: 21 %
Lymphs Abs: 1.7 10*3/uL (ref 0.7–4.0)
MCH: 31.9 pg (ref 26.0–34.0)
MCHC: 33.6 g/dL (ref 30.0–36.0)
MCV: 95.1 fL (ref 80.0–100.0)
Monocytes Absolute: 0.5 10*3/uL (ref 0.1–1.0)
Monocytes Relative: 6 %
Neutro Abs: 5.5 10*3/uL (ref 1.7–7.7)
Neutrophils Relative %: 72 %
Platelets: 197 10*3/uL (ref 150–400)
RBC: 2.85 MIL/uL — ABNORMAL LOW (ref 3.87–5.11)
RDW: 12.8 % (ref 11.5–15.5)
WBC: 7.8 10*3/uL (ref 4.0–10.5)
nRBC: 0 % (ref 0.0–0.2)

## 2023-04-11 LAB — COMPREHENSIVE METABOLIC PANEL
ALT: 13 U/L (ref 0–44)
AST: 22 U/L (ref 15–41)
Albumin: 3.1 g/dL — ABNORMAL LOW (ref 3.5–5.0)
Alkaline Phosphatase: 39 U/L (ref 38–126)
Anion gap: 6 (ref 5–15)
BUN: 33 mg/dL — ABNORMAL HIGH (ref 8–23)
CO2: 23 mmol/L (ref 22–32)
Calcium: 8.3 mg/dL — ABNORMAL LOW (ref 8.9–10.3)
Chloride: 109 mmol/L (ref 98–111)
Creatinine, Ser: 0.51 mg/dL (ref 0.44–1.00)
GFR, Estimated: >60 mL/min (ref >60)
Glucose, Bld: 125 mg/dL — ABNORMAL HIGH (ref 70–99)
Potassium: 3.7 mmol/L (ref 3.5–5.1)
Sodium: 138 mmol/L (ref 135–145)
Total Bilirubin: 0.7 mg/dL (ref <1.2)
Total Protein: 5.7 g/dL — ABNORMAL LOW (ref 6.5–8.1)

## 2023-04-11 LAB — POC OCCULT BLOOD, ED: Fecal Occult Bld: POSITIVE — AB

## 2023-04-11 NOTE — ED Provider Notes (Signed)
Care assumed at 2300.  Patient sent in due to being found with feces and urine in the bed.  Hemoglobin is downtrending, Hemoccult positive with melanotic stool.  UA is consistent with UTI.  Plan to admit for ongoing care.  Discussed with patient's daughter over the phone findings of studies.  Patient is not having bowel movements in the emergency department, currently hemodynamically stable.  Will not transfuse at this time but will treat with Protonix for possible upper GI bleed given her melena as well as elevation in BUN.  Medicine consulted for admission for ongoing care.  CRITICAL CARE Performed by: Tilden Fossa   Total critical care time: 30 minutes  Critical care time was exclusive of separately billable procedures and treating other patients.  Critical care was necessary to treat or prevent imminent or life-threatening deterioration.  Critical care was time spent personally by me on the following activities: development of treatment plan with patient and/or surrogate as well as nursing, discussions with consultants, evaluation of patient's response to treatment, examination of patient, obtaining history from patient or surrogate, ordering and performing treatments and interventions, ordering and review of laboratory studies, ordering and review of radiographic studies, pulse oximetry and re-evaluation of patient's condition.    Tilden Fossa, MD 04/12/23 [phone]

## 2023-04-11 NOTE — ED Provider Notes (Signed)
[place] EMERGENCY DEPARTMENT AT [facility] Provider Note  CSN: [id number] Arrival date & time: 04/11/23 2017  Chief Complaint(s) Transitions Of Care  HPI Susan Ford is a 89 y.o. female with PMH dementia, peripheral neuropathy, peptic ulcer disease, PE on Eliquis who presents emergency department for evaluation of fatigue and worsening mental status.  Patient was seen yesterday with similar symptoms and today was found unable to get out of bed covered in urine and feces.  Patient does not remember this.  Additional history obtained from staff at the patient's assisted living facility and patient's son who states that memory difficulties have been worsening as well as patient's worsening fatigue.  They are looking into memory care units but there is concern that patient's current assisted living facility is unable to meet her needs.  Additional history unable be obtained due to patient's underlying dementia.   Past Medical History Past Medical History:  Diagnosis Date   Arthritis    Cataract 2007   bilateral cat ext    Cholecystitis    Complication of anesthesia Feb 21, 2014   atrial fib after cholecystectomy, lasted a few hours, none since   Dysrhythmia 02-21-2014   after cholecystectomy had atrial fib for a few hours, none since   GERD (gastroesophageal reflux disease)    Had endoscopy no history of Barrett's   Hx of nonmelanoma skin cancer    Unspecified hereditary and idiopathic peripheral neuropathy    Patient Active Problem List   Diagnosis Date Noted   ABLA (acute blood loss anemia) 04-26-23   History of bleeding peptic ulcer 04-26-2023   History of pulmonary embolism, 12/28/22 26-Apr-2023   Chronic anticoagulation 04/26/23   Pulmonary embolism (HCC) 12/28/2022   Diarrhea 12/28/2022   Melena, with acute blood loss anemia 07/08/2022   Dementia without behavioral disturbance (HCC) 10/03/2017   Gait abnormality 06/29/2016   Chronic bilateral low back  pain without sciatica 06/29/2016   Memory loss 2015/10/29   Death of husband 04/25/14   Bereavement due to life event Apr 25, 2014   Absolute anemia 04/08/2014   Atrial flutter (HCC) 02/23/2014   Acute calculous cholecystitis 02/21/2014   Spinal stenosis of lumbar region 01/04/2014   Recent bereavement 10/25/2013   Arthritis 10/25/2013   Hyperlipidemia 10/23/2013   Hereditary and idiopathic peripheral neuropathy    Nausea alone 07/14/2012   Neuropathy, peripheral 05/05/2011   High risk medication use 05/05/2011   Subcutaneous emphysema (HCC) 05/05/2011   Cough 04/03/2011   Neuropathy 04/03/2011   Osteoarthritis 05/15/2010   STIFFNESS OF JOINT NEC ANKLE AND FOOT 05/15/2010   GANGLION CYST, WRIST, RIGHT 05/15/2010   VERTIGO, BENIGN PAROXYSMAL POSITION 01/31/2007   Home Medication(s) Prior to Admission medications   Medication Sig Start Date End Date Taking? Authorizing Provider  apixaban (ELIQUIS) 2.5 MG TABS tablet Take 1 tablet (2.5 mg total) by mouth 2 (two) times daily. For pulmonary embolism treatment prevention decrease dose. 04/01/23  Yes Panosh, Neta Mends, MD  Ascorbic Acid (VITAMIN C) 1000 MG tablet Take 1,000 mg by mouth daily.   Yes [provider]  gabapentin (NEURONTIN) 300 MG capsule Take 1 capsule (300 mg total) by mouth 4 (four) times daily. 11/03/22  Yes Panosh, Neta Mends, MD  acetaminophen (TYLENOL) 325 MG tablet Take 650 mg by mouth every 6 (six) hours as needed for moderate pain (pain score 4-6) or mild pain (pain score 1-3).    [provider]  Apoaequorin (PREVAGEN) 10 MG CAPS Take 10 mg by mouth daily.  [provider]  estradiol (CLIMARA - DOSED IN MG/24 HR) 0.025 mg/24hr patch PLACE 1 PATCH (0.025 MG TOTAL) ONTO THE SKIN ONCE A WEEK ON SATURDAY Patient taking differently: Place 0.025 mg onto the skin once a week. Sundays 01/13/23   Panosh, Neta Mends, MD  hydrocortisone cream 1 % Apply topically 2 (two) times daily as needed for  itching. Patient not taking: Reported on 04/10/2023 01/01/23   Lanae Boast, MD  loratadine (ALLERGY RELIEF) 10 MG tablet Take 10 mg by mouth every evening. Patient not taking: Reported on 04/10/2023    [provider]  Multiple Vitamin (MULTIVITAMIN) tablet Take 1 tablet by mouth daily after lunch.    [provider]  ondansetron (ZOFRAN-ODT) 4 MG disintegrating tablet Take 1 tablet (4 mg total) by mouth every 8 (eight) hours as needed. 04/10/23   Jacalyn Lefevre, MD  polyethylene glycol (MIRALAX / GLYCOLAX) 17 g packet Take 17 g by mouth daily. Patient taking differently: Take 17 g by mouth as needed for moderate constipation or mild constipation. 01/02/23   Lanae Boast, MD  senna (SENOKOT) 8.6 MG TABS tablet Take 1 tablet (8.6 mg total) by mouth daily. Patient taking differently: Take 2 tablets by mouth every other day. 01/02/23   Lanae Boast, MD  simvastatin (ZOCOR) 10 MG tablet Take 1 tablet (10 mg total) by mouth daily. 12/20/22   Panosh, Neta Mends, MD                                                                                                                                    Past Surgical History Past Surgical History:  Procedure Laterality Date   ABDOMINAL HYSTERECTOMY  15 yrs ago   complete   BIOPSY  07/09/2022   Procedure: BIOPSY;  Surgeon: Kathi Der, MD;  Location: [facility] ENDOSCOPY;  Service: Gastroenterology;;   BREAST CYST ASPIRATION     CHOLECYSTECTOMY N/A 02/21/2014   Procedure: LAPAROSCOPIC CHOLECYSTECTOMY WITH INTRAOPERATIVE CHOLANGIOGRAM;  Surgeon: Frederik Schmidt, MD;  Location: [facility] OR;  Service: General;  Laterality: N/A;   ENDOSCOPIC RETROGRADE CHOLANGIOPANCREATOGRAPHY (ERCP) WITH PROPOFOL N/A 05/29/2014   Procedure: ENDOSCOPIC RETROGRADE CHOLANGIOPANCREATOGRAPHY (ERCP) WITH PROPOFOL;  Surgeon: Willis Modena, MD;  Location: [facility] ENDOSCOPY;  Service: Endoscopy;  Laterality: N/A;   ERCP N/A 02/24/2014   Procedure: ENDOSCOPIC RETROGRADE CHOLANGIOPANCREATOGRAPHY (ERCP);   Surgeon: Willis Modena, MD;  Location: [facility] END[facility]COPY;  Service: Endoscopy;  Laterality: N/A;   ESOPHAGOGASTRODUODENOSCOPY (EGD) WITH PROPOFOL N/A 07/09/2022   Procedure: ESOPHAGOGASTRODUODENOSCOPY (EGD) WITH PROPOFOL;  Surgeon: Kathi Der, MD;  Location: [facility] ENDOSCOPY;  Service: Gastroenterology;  Laterality: N/A;   EYE SURGERY Bilateral 2007   both eyes cataracts with lens replacments   LUMBAR LAMINECTOMY/DECOMPRESSION MICRODISCECTOMY  02/01/2012   Procedure: LUMBAR LAMINECTOMY/DECOMPRESSION MICRODISCECTOMY 2 LEVELS;  Surgeon: Barnett Abu, MD;  Location: [facility] NEURO ORS;  Service: Neurosurgery;  Laterality: Left;  Left Lumbar Four-Five Lumbar Five-Sacral One Laminectomies/Foraminotomies/Microscope   SPYGLASS CHOLANGIOSCOPY N/A 05/29/2014  Procedure: SPYGLASS CHOLANGIOSCOPY;  Surgeon: Willis Modena, MD;  Location: [facility] ENDOSCOPY;  Service: Endoscopy;  Laterality: N/A;   TONSILLECTOMY  age 16   Family History Family History  Problem Relation Age of Onset   Neurodegenerative disease Mother        Shy Drager died at 51   Alcohol abuse Other    Heart disease Other     Social History Social History   Tobacco Use   Smoking status: Former    Current packs/day: 0.00    Average packs/day: 0.3 packs/day for 7.0 years (1.8 ttl pk-yrs)    Types: Cigarettes    Start date: 04/26/1956    Quit date: 04/27/1963    Years since quitting: 60.0   Smokeless tobacco: Never  Substance Use Topics   Alcohol use: Yes    Alcohol/week: 7.0 standard drinks of alcohol    Types: 7 Glasses of wine per week    Comment: 1 glass of wine daily   Drug use: No    Types: Hydrocodone   Allergies Keflex [cephalexin], Lyrica [pregabalin], Oxytetracycline, and Memantine  Review of Systems Review of Systems  Unable to perform ROS: Dementia    Physical Exam Vital Signs  I have reviewed the triage vital signs BP (!) 141/68   Pulse (!) 52   Temp 98.4 F (36.9 C) (Oral)   Resp 20   SpO2 99%   Physical  Exam Vitals and nursing note reviewed.  Constitutional:      General: She is not in acute distress.    Appearance: She is well-developed.  HENT:     Head: Normocephalic and atraumatic.  Eyes:     Conjunctiva/sclera: Conjunctivae normal.  Cardiovascular:     Rate and Rhythm: Normal rate and regular rhythm.     Heart sounds: No murmur heard. Pulmonary:     Effort: Pulmonary effort is normal. No respiratory distress.     Breath sounds: Normal breath sounds.  Abdominal:     Palpations: Abdomen is soft.     Tenderness: There is no abdominal tenderness.  Genitourinary:    Rectum: Guaiac result positive.  Musculoskeletal:        General: No swelling.     Cervical back: Neck supple.  Skin:    General: Skin is warm and dry.     Capillary Refill: Capillary refill takes less than 2 seconds.  Neurological:     Mental Status: She is alert.  Psychiatric:        Mood and Affect: Mood normal.     ED Results and Treatments Labs (all labs ordered are listed, but only abnormal results are displayed) Labs Reviewed  COMPREHENSIVE METABOLIC PANEL - Abnormal; Notable for the following components:      Result Value   Glucose, Bld 125 (*)    BUN 33 (*)    Calcium 8.3 (*)    Total Protein 5.7 (*)    Albumin 3.1 (*)    All other components within normal limits  CBC WITH DIFFERENTIAL/PLATELET - Abnormal; Notable for the following components:   RBC 2.85 (*)    Hemoglobin 9.1 (*)    HCT 27.1 (*)    All other components within normal limits  URINALYSIS, ROUTINE W REFLEX MICROSCOPIC - Abnormal; Notable for the following components:   APPearance HAZY (*)    Ketones, ur 20 (*)    Leukocytes,Ua MODERATE (*)    Bacteria, UA MANY (*)    All other components within normal limits  CBC - Abnormal; Notable  for the following components:   RBC 2.46 (*)    Hemoglobin 8.0 (*)    HCT 23.7 (*)    All other components within normal limits  POC OCCULT BLOOD, ED - Abnormal; Notable for the following  components:   Fecal Occult Bld POSITIVE (*)    All other components within normal limits  URINE CULTURE  PROTIME-INR  CBC  CBC  TYPE AND SCREEN  PREPARE RBC (CROSSMATCH)                                                                                                                          Radiology No results found.  Pertinent labs & imaging results that were available during my care of the patient were reviewed by me and considered in my medical decision making (see MDM for details).  Medications Ordered in ED Medications  gabapentin (NEURONTIN) capsule 300 mg (has no administration in time range)  acetaminophen (TYLENOL) tablet 650 mg (has no administration in time range)    Or  acetaminophen (TYLENOL) suppository 650 mg (has no administration in time range)  ondansetron (ZOFRAN) tablet 4 mg (has no administration in time range)    Or  ondansetron (ZOFRAN) injection 4 mg (has no administration in time range)  dextrose 5 %-0.9 % sodium chloride infusion ( Intravenous New Bag/Given 04/12/23 0218)  pantoprazole (PROTONIX) injection 40 mg (has no administration in time range)  0.9 %  sodium chloride infusion (Manually program via Guardrails IV Fluids) (has no administration in time range)  pantoprazole (PROTONIX) injection 40 mg (40 mg Intravenous Given 04/12/23 0051)  cefTRIAXone (ROCEPHIN) 1 g in sodium chloride 0.9 % 100 mL IVPB (0 g Intravenous Stopped 04/12/23 0211)                                                                                                                                     Procedures Procedures  (including critical care time)  Medical Decision Making / ED Course   This patient presents to the ED for concern of fatigue, altered mental status, this involves an extensive number of treatment options, and is a complaint that carries with it a high risk of complications and morbidity.  The differential diagnosis includes infection, metabolic/toxic  encephalopathy, hypoglycemia, malperfusion, hypoxia, trauma or other intracranial process  MDM: Patient seen emergency room for evaluation of worsening.  Exam largely unremarkable but patient does have  jet black guaiac positive stool.  Initial laboratory evaluation with a hemoglobin of 9.1 which is downtrending from previous.  Pending urinalysis at time of signout.  Anticipate admission.  Please see provider signout note for continuation of workup.   Additional history obtained: -Additional history obtained from multiple family members -External records from outside source obtained and reviewed including: Chart review including previous notes, labs, imaging, consultation notes   Lab Tests: -I ordered, reviewed, and interpreted labs.   The pertinent results include:   Labs Reviewed  COMPREHENSIVE METABOLIC PANEL - Abnormal; Notable for the following components:      Result Value   Glucose, Bld 125 (*)    BUN 33 (*)    Calcium 8.3 (*)    Total Protein 5.7 (*)    Albumin 3.1 (*)    All other components within normal limits  CBC WITH DIFFERENTIAL/PLATELET - Abnormal; Notable for the following components:   RBC 2.85 (*)    Hemoglobin 9.1 (*)    HCT 27.1 (*)    All other components within normal limits  URINALYSIS, ROUTINE W REFLEX MICROSCOPIC - Abnormal; Notable for the following components:   APPearance HAZY (*)    Ketones, ur 20 (*)    Leukocytes,Ua MODERATE (*)    Bacteria, UA MANY (*)    All other components within normal limits  CBC - Abnormal; Notable for the following components:   RBC 2.46 (*)    Hemoglobin 8.0 (*)    HCT 23.7 (*)    All other components within normal limits  POC OCCULT BLOOD, ED - Abnormal; Notable for the following components:   Fecal Occult Bld POSITIVE (*)    All other components within normal limits  URINE CULTURE  PROTIME-INR  CBC  CBC  TYPE AND SCREEN  PREPARE RBC (CROSSMATCH)      Medicines ordered and prescription drug management: Meds  ordered this encounter  Medications   pantoprazole (PROTONIX) injection 40 mg   cefTRIAXone (ROCEPHIN) 1 g in sodium chloride 0.9 % 100 mL IVPB    Antibiotic Indication::   UTI   gabapentin (NEURONTIN) capsule 300 mg   OR Linked Order Group    acetaminophen (TYLENOL) tablet 650 mg    acetaminophen (TYLENOL) suppository 650 mg   OR Linked Order Group    ondansetron (ZOFRAN) tablet 4 mg    ondansetron (ZOFRAN) injection 4 mg   dextrose 5 %-0.9 % sodium chloride infusion   pantoprazole (PROTONIX) injection 40 mg   0.9 %  sodium chloride infusion (Manually program via Guardrails IV Fluids)    -I have reviewed the patients home medicines and have made adjustments as needed  Critical interventions none    Cardiac Monitoring: The patient was maintained on a cardiac monitor.  I personally viewed and interpreted the cardiac monitored which showed an underlying rhythm of: NSR  Social Determinants of Health:  Factors impacting patients care include: Lives in assisted living facility, familial concern that she will need a higher level of care   Reevaluation: After the interventions noted above, I reevaluated the patient and found that they have :stayed the same  Co morbidities that complicate the patient evaluation  Past Medical History:  Diagnosis Date   Arthritis    Cataract 2007   bilateral cat ext    Cholecystitis    Complication of anesthesia Feb 21, 2014   atrial fib after cholecystectomy, lasted a few hours, none since   Dysrhythmia 02-21-2014   after cholecystectomy had atrial fib for  a few hours, none since   GERD (gastroesophageal reflux disease)    Had endoscopy no history of Barrett's   Hx of nonmelanoma skin cancer    Unspecified hereditary and idiopathic peripheral neuropathy       Dispostion: I considered admission for this patient, and disposition pending completion of urinalysis.  Please see provider signout for continuation of workup.  Anticipate  admission.     Final Clinical Impression(s) / ED Diagnoses Final diagnoses:  Acute GI bleeding  Acute UTI     @PCDICTATION @    Glendora Score, MD 04/12/23 1146

## 2023-04-11 NOTE — ED Triage Notes (Addendum)
Pt BIB EMS from [facility] for fecal incontinence. Pt was found at the facility by staff covered in fecal matter. Pt is ambulatory and A&Ox4. Per EMS, pt stated she forgot to take her memory pill today. Per EMS, staff stated "you should go to the hospital so they can look after you".  Cbg 165

## 2023-04-12 ENCOUNTER — Encounter (HOSPITAL_COMMUNITY): Payer: Self-pay | Admitting: Internal Medicine

## 2023-04-12 DIAGNOSIS — Z888 Allergy status to other drugs, medicaments and biological substances status: Secondary | ICD-10-CM | POA: Diagnosis not present

## 2023-04-12 DIAGNOSIS — I4891 Unspecified atrial fibrillation: Secondary | ICD-10-CM | POA: Diagnosis present

## 2023-04-12 DIAGNOSIS — Z8711 Personal history of peptic ulcer disease: Secondary | ICD-10-CM

## 2023-04-12 DIAGNOSIS — K5710 Diverticulosis of small intestine without perforation or abscess without bleeding: Secondary | ICD-10-CM | POA: Diagnosis not present

## 2023-04-12 DIAGNOSIS — E860 Dehydration: Secondary | ICD-10-CM | POA: Diagnosis present

## 2023-04-12 DIAGNOSIS — K259 Gastric ulcer, unspecified as acute or chronic, without hemorrhage or perforation: Secondary | ICD-10-CM | POA: Diagnosis not present

## 2023-04-12 DIAGNOSIS — Z85828 Personal history of other malignant neoplasm of skin: Secondary | ICD-10-CM | POA: Diagnosis not present

## 2023-04-12 DIAGNOSIS — K921 Melena: Secondary | ICD-10-CM | POA: Diagnosis not present

## 2023-04-12 DIAGNOSIS — D500 Iron deficiency anemia secondary to blood loss (chronic): Secondary | ICD-10-CM | POA: Diagnosis not present

## 2023-04-12 DIAGNOSIS — N390 Urinary tract infection, site not specified: Secondary | ICD-10-CM | POA: Diagnosis present

## 2023-04-12 DIAGNOSIS — K254 Chronic or unspecified gastric ulcer with hemorrhage: Secondary | ICD-10-CM | POA: Diagnosis present

## 2023-04-12 DIAGNOSIS — H9190 Unspecified hearing loss, unspecified ear: Secondary | ICD-10-CM | POA: Diagnosis present

## 2023-04-12 DIAGNOSIS — Z86711 Personal history of pulmonary embolism: Secondary | ICD-10-CM

## 2023-04-12 DIAGNOSIS — Z90710 Acquired absence of both cervix and uterus: Secondary | ICD-10-CM | POA: Diagnosis not present

## 2023-04-12 DIAGNOSIS — F0280 Dementia in other diseases classified elsewhere without behavioral disturbance: Secondary | ICD-10-CM | POA: Diagnosis present

## 2023-04-12 DIAGNOSIS — Z7901 Long term (current) use of anticoagulants: Secondary | ICD-10-CM | POA: Diagnosis not present

## 2023-04-12 DIAGNOSIS — D62 Acute posthemorrhagic anemia: Principal | ICD-10-CM

## 2023-04-12 DIAGNOSIS — K922 Gastrointestinal hemorrhage, unspecified: Principal | ICD-10-CM

## 2023-04-12 DIAGNOSIS — E876 Hypokalemia: Secondary | ICD-10-CM | POA: Diagnosis not present

## 2023-04-12 DIAGNOSIS — G629 Polyneuropathy, unspecified: Secondary | ICD-10-CM | POA: Diagnosis present

## 2023-04-12 DIAGNOSIS — Z79899 Other long term (current) drug therapy: Secondary | ICD-10-CM | POA: Diagnosis not present

## 2023-04-12 DIAGNOSIS — G309 Alzheimer's disease, unspecified: Secondary | ICD-10-CM | POA: Diagnosis present

## 2023-04-12 DIAGNOSIS — K319 Disease of stomach and duodenum, unspecified: Secondary | ICD-10-CM | POA: Diagnosis not present

## 2023-04-12 DIAGNOSIS — E785 Hyperlipidemia, unspecified: Secondary | ICD-10-CM | POA: Diagnosis not present

## 2023-04-12 DIAGNOSIS — Z87891 Personal history of nicotine dependence: Secondary | ICD-10-CM | POA: Diagnosis not present

## 2023-04-12 DIAGNOSIS — Z881 Allergy status to other antibiotic agents status: Secondary | ICD-10-CM | POA: Diagnosis not present

## 2023-04-12 DIAGNOSIS — K219 Gastro-esophageal reflux disease without esophagitis: Secondary | ICD-10-CM | POA: Diagnosis present

## 2023-04-12 LAB — CBC
HCT: 23.7 % — ABNORMAL LOW (ref 36.0–46.0)
HCT: 28.8 % — ABNORMAL LOW (ref 36.0–46.0)
HCT: 32.3 % — ABNORMAL LOW (ref 36.0–46.0)
Hemoglobin: 8 g/dL — ABNORMAL LOW (ref 12.0–15.0)
Hemoglobin: 9.5 g/dL — ABNORMAL LOW (ref 12.0–15.0)
Hemoglobin: 10.2 g/dL — ABNORMAL LOW (ref 12.0–15.0)
MCH: 32.5 pg (ref 26.0–34.0)
MCH: 31.1 pg (ref 26.0–34.0)
MCH: 32.2 pg (ref 26.0–34.0)
MCHC: 33.8 g/dL (ref 30.0–36.0)
MCHC: 33 g/dL (ref 30.0–36.0)
MCHC: 31.6 g/dL (ref 30.0–36.0)
MCV: 96.3 fL (ref 80.0–100.0)
MCV: 94.4 fL (ref 80.0–100.0)
MCV: 101.9 fL — ABNORMAL HIGH (ref 80.0–100.0)
Platelets: 180 10*3/uL (ref 150–400)
Platelets: 174 10*3/uL (ref 150–400)
Platelets: 156 10*3/uL (ref 150–400)
RBC: 2.46 MIL/uL — ABNORMAL LOW (ref 3.87–5.11)
RBC: 3.05 MIL/uL — ABNORMAL LOW (ref 3.87–5.11)
RBC: 3.17 MIL/uL — ABNORMAL LOW (ref 3.87–5.11)
RDW: 12.8 % (ref 11.5–15.5)
RDW: 14 % (ref 11.5–15.5)
RDW: 14.3 % (ref 11.5–15.5)
WBC: 7 10*3/uL (ref 4.0–10.5)
WBC: 6.2 10*3/uL (ref 4.0–10.5)
WBC: 6.2 10*3/uL (ref 4.0–10.5)
nRBC: 0 % (ref 0.0–0.2)
nRBC: 0 % (ref 0.0–0.2)
nRBC: 0 % (ref 0.0–0.2)

## 2023-04-12 LAB — PREPARE RBC (CROSSMATCH)

## 2023-04-12 LAB — PROTIME-INR
INR: 1 (ref 0.8–1.2)
Prothrombin Time: 12.9 s (ref 11.4–15.2)

## 2023-04-12 MED ORDER — PANTOPRAZOLE SODIUM 40 MG IV SOLR
40.0000 mg | Freq: Two times a day (BID) | INTRAVENOUS | Status: DC
  Administered 2023-04-12 – 2023-04-15 (×6): 40 mg via INTRAVENOUS
  Filled 2023-04-12 (×6): qty 10

## 2023-04-12 MED ORDER — ONDANSETRON HCL 4 MG PO TABS: 4.0000 mg | ORAL_TABLET | Freq: Four times a day (QID) | ORAL | Status: DC | PRN

## 2023-04-12 MED ORDER — PANTOPRAZOLE SODIUM 40 MG IV SOLR
40.0000 mg | Freq: Once | INTRAVENOUS | Status: AC
  Administered 2023-04-12: 40 mg via INTRAVENOUS
  Filled 2023-04-12: qty 10

## 2023-04-12 MED ORDER — SODIUM CHLORIDE 0.9 % IV SOLN
1.0000 g | Freq: Once | INTRAVENOUS | Status: AC
  Administered 2023-04-12: 1 g via INTRAVENOUS
  Filled 2023-04-12: qty 10

## 2023-04-12 MED ORDER — ONDANSETRON HCL 4 MG/2ML IJ SOLN: 4.0000 mg | Freq: Four times a day (QID) | INTRAMUSCULAR | Status: DC | PRN

## 2023-04-12 MED ORDER — SODIUM CHLORIDE 0.9% IV SOLUTION: Freq: Once | INTRAVENOUS | Status: DC

## 2023-04-12 MED ORDER — DEXTROSE-SODIUM CHLORIDE 5-0.9 % IV SOLN: INTRAVENOUS | Status: AC

## 2023-04-12 MED ORDER — ACETAMINOPHEN 650 MG RE SUPP
650.0000 mg | Freq: Four times a day (QID) | RECTAL | Status: DC | PRN
Start: 2023-04-12 — End: 2023-04-15

## 2023-04-12 MED ORDER — ACETAMINOPHEN 325 MG PO TABS
650.0000 mg | ORAL_TABLET | Freq: Four times a day (QID) | ORAL | Status: DC | PRN
  Administered 2023-04-13 – 2023-04-15 (×3): 650 mg via ORAL
  Filled 2023-04-12 (×3): qty 2

## 2023-04-12 MED ORDER — GABAPENTIN 300 MG PO CAPS
300.0000 mg | ORAL_CAPSULE | Freq: Four times a day (QID) | ORAL | Status: DC
  Administered 2023-04-12 – 2023-04-15 (×11): 300 mg via ORAL
  Filled 2023-04-12 (×11): qty 1

## 2023-04-12 NOTE — Consult Note (Signed)
Reason for Consult: GI bleed Referring Physician: Hospital team  Susan Ford is an 89 y.o. female.  HPI: Patient seen and examined in both her hospital computer chart in our office computer chart was reviewed and she is not aware that she had ulcers nor having bleeding and she is on Eliquis at home and her last dose was yesterday and she does not have any pain and she does not think she uses any extra nonsteroidals at home and her case was discussed with the hospital team  Past Medical History:  Diagnosis Date   Arthritis    Cataract 2007   bilateral cat ext    Cholecystitis    Complication of anesthesia Feb 21, 2014   atrial fib after cholecystectomy, lasted a few hours, none since   Dysrhythmia 02-21-2014   after cholecystectomy had atrial fib for a few hours, none since   GERD (gastroesophageal reflux disease)    Had endoscopy no history of Barrett's   Hx of nonmelanoma skin cancer    Unspecified hereditary and idiopathic peripheral neuropathy     Past Surgical History:  Procedure Laterality Date   ABDOMINAL HYSTERECTOMY  15 yrs ago   complete   BIOPSY  07/09/2022   Procedure: BIOPSY;  Surgeon: Kathi Der, MD;  Location: [facility];  Service: Gastroenterology;;   BREAST CYST ASPIRATION     CHOLECYSTECTOMY N/A 02/21/2014   Procedure: LAPAROSCOPIC CHOLECYSTECTOMY WITH INTRAOPERATIVE CHOLANGIOGRAM;  Surgeon: Frederik Schmidt, MD;  Location: [facility] OR;  Service: General;  Laterality: N/A;   ENDOSCOPIC RETROGRADE CHOLANGIOPANCREATOGRAPHY (ERCP) WITH PROPOFOL N/A 05/29/2014   Procedure: ENDOSCOPIC RETROGRADE CHOLANGIOPANCREATOGRAPHY (ERCP) WITH PROPOFOL;  Surgeon: Willis Modena, MD;  Location: [facility];  Service: Endoscopy;  Laterality: N/A;   ERCP N/A 02/24/2014   Procedure: ENDOSCOPIC RETROGRADE CHOLANGIOPANCREATOGRAPHY (ERCP);  Surgeon: Willis Modena, MD;  Location: [facility] ENDOSCOPY;  Service: Endoscopy;  Laterality: N/A;   ESOPHAGOGASTRODUODENOSCOPY (EGD) WITH PROPOFOL N/A  07/09/2022   Procedure: ESOPHAGOGASTRODUODENOSCOPY (EGD) WITH PROPOFOL;  Surgeon: Kathi Der, MD;  Location: [facility];  Service: Gastroenterology;  Laterality: N/A;   EYE SURGERY Bilateral 2007   both eyes cataracts with lens replacments   LUMBAR LAMINECTOMY/DECOMPRESSION MICRODISCECTOMY  02/01/2012   Procedure: LUMBAR LAMINECTOMY/DECOMPRESSION MICRODISCECTOMY 2 LEVELS;  Surgeon: Barnett Abu, MD;  Location: [facility];  Service: Neurosurgery;  Laterality: Left;  Left Lumbar Four-Five Lumbar Five-Sacral One Laminectomies/Foraminotomies/Microscope   SPYGLASS CHOLANGIOSCOPY N/A 05/29/2014   Procedure: SPYGLASS CHOLANGIOSCOPY;  Surgeon: Willis Modena, MD;  Location: [facility];  Service: Endoscopy;  Laterality: N/A;   TONSILLECTOMY  age 38    Family History  Problem Relation Age of Onset   Neurodegenerative disease Mother        Shy Drager died at 53   Alcohol abuse Other    Heart disease Other     Social History:  reports that she quit smoking about 60 years ago. Her smoking use included cigarettes. She started smoking about 67 years ago. She has a 1.8 pack-year smoking history. She has never used smokeless tobacco. She reports current alcohol use of about 7.0 standard drinks of alcohol per week. She reports that she does not use drugs.  Allergies:  Allergies  Allergen Reactions   Keflex [Cephalexin]     Dizziness   Lyrica [Pregabalin] Other (See Comments)    "High Fever"    Oxytetracycline Other (See Comments)    "High Fever" caused by terramycin    Memantine Rash    Medications: I have reviewed the patient's current medications.  Results for orders placed or performed during the hospital encounter of 04/11/23 (from the past 48 hours)  Comprehensive metabolic panel     Status: Abnormal   Collection Time: 04/11/23  9:12 PM  Result Value Ref Range   Sodium 138 135 - 145 mmol/L   Potassium 3.7 3.5 - 5.1 mmol/L   Chloride 109 98 - 111 mmol/L   CO2 23 22 - 32 mmol/L    Glucose, Bld 125 (H) 70 - 99 mg/dL    Comment: Glucose reference range applies only to samples taken after fasting for at least 8 hours.   BUN 33 (H) 8 - 23 mg/dL   Creatinine, Ser 6.29 0.44 - 1.00 mg/dL   Calcium 8.3 (L) 8.9 - 10.3 mg/dL   Total Protein 5.7 (L) 6.5 - 8.1 g/dL   Albumin 3.1 (L) 3.5 - 5.0 g/dL   AST 22 15 - 41 U/L   ALT 13 0 - 44 U/L   Alkaline Phosphatase 39 38 - 126 U/L   Total Bilirubin 0.7 <1.2 mg/dL   GFR, Estimated >52 >84 mL/min    Comment: (NOTE) Calculated using the CKD-EPI Creatinine Equation (2021)    Anion gap 6 5 - 15    Comment: Performed at [facility], [address], [place], [place] [address]  CBC with Differential     Status: Abnormal   Collection Time: 04/11/23  9:12 PM  Result Value Ref Range   WBC 7.8 4.0 - 10.5 K/uL   RBC 2.85 (L) 3.87 - 5.11 MIL/uL   Hemoglobin 9.1 (L) 12.0 - 15.0 g/dL   HCT 01.0 (L) 27.2 - 53.6 %   MCV 95.1 80.0 - 100.0 fL   MCH 31.9 26.0 - 34.0 pg   MCHC 33.6 30.0 - 36.0 g/dL   RDW 64.4 03.4 - 74.2 %   Platelets 197 150 - 400 K/uL   nRBC 0.0 0.0 - 0.2 %   Neutrophils Relative % 72 %   Neutro Abs 5.5 1.7 - 7.7 K/uL   Lymphocytes Relative 21 %   Lymphs Abs 1.7 0.7 - 4.0 K/uL   Monocytes Relative 6 %   Monocytes Absolute 0.5 0.1 - 1.0 K/uL   Eosinophils Relative 0 %   Eosinophils Absolute 0.0 0.0 - 0.5 K/uL   Basophils Relative 1 %   Basophils Absolute 0.1 0.0 - 0.1 K/uL   Immature Granulocytes 0 %   Abs Immature Granulocytes 0.03 0.00 - 0.07 K/uL    Comment: Performed at [facility], [address] [place], [place] [address]  Protime-INR     Status: None   Collection Time: 04/11/23  9:12 PM  Result Value Ref Range   Prothrombin Time 12.9 11.4 - 15.2 seconds   INR 1.0 0.8 - 1.2    Comment: (NOTE) INR goal varies based on device and disease states. Performed at [facility], [address] [place], [place] [address]   Urinalysis, Routine w reflex  microscopic -Urine, Clean Catch     Status: Abnormal   Collection Time: 04/11/23 10:19 PM  Result Value Ref Range   Color, Urine YELLOW YELLOW   APPearance HAZY (A) CLEAR   Specific Gravity, Urine 1.019 1.005 - 1.030   pH 7.0 5.0 - 8.0   Glucose, UA NEGATIVE NEGATIVE mg/dL   Hgb urine dipstick NEGATIVE NEGATIVE   Bilirubin Urine NEGATIVE NEGATIVE   Ketones, ur 20 (A) NEGATIVE mg/dL   Protein, ur NEGATIVE NEGATIVE mg/dL   Nitrite NEGATIVE  NEGATIVE   Leukocytes,Ua MODERATE (A) NEGATIVE   RBC / HPF 0-5 0 - 5 RBC/hpf   WBC, UA >50 0 - 5 WBC/hpf   Bacteria, UA MANY (A) NONE SEEN   Squamous Epithelial / HPF 0-5 0 - 5 /HPF   Mucus PRESENT    Budding Yeast PRESENT     Comment: Performed at [facility], [address], [place], [place] [address]  POC occult blood, ED     Status: Abnormal   Collection Time: 04/11/23 11:30 PM  Result Value Ref Range   Fecal Occult Bld POSITIVE (A) NEGATIVE  Type and screen [facility]     Status: None   Collection Time: 04/12/23 12:48 AM  Result Value Ref Range   ABO/RH(D) O POS    Antibody Screen NEG    Sample Expiration      04/15/2023,2359 Performed at [facility], [address], [place], [place] [address]   CBC     Status: Abnormal   Collection Time: 04/12/23  6:42 AM  Result Value Ref Range   WBC 7.0 4.0 - 10.5 K/uL   RBC 2.46 (L) 3.87 - 5.11 MIL/uL   Hemoglobin 8.0 (L) 12.0 - 15.0 g/dL   HCT 82.9 (L) 56.2 - 13.0 %   MCV 96.3 80.0 - 100.0 fL   MCH 32.5 26.0 - 34.0 pg   MCHC 33.8 30.0 - 36.0 g/dL   RDW 86.5 78.4 - 69.6 %   Platelets 180 150 - 400 K/uL   nRBC 0.0 0.0 - 0.2 %    Comment: Performed at [facility], [address], [place], [place] [address]    CT ABDOMEN PELVIS W CONTRAST Result Date: 04/10/2023 CLINICAL DATA:  Acute abdominal pain, nausea and vomiting beginning last night. EXAM: CT ABDOMEN AND PELVIS WITH CONTRAST TECHNIQUE: Multidetector CT  imaging of the abdomen and pelvis was performed using the standard protocol following bolus administration of intravenous contrast. RADIATION DOSE REDUCTION: This exam was performed according to the departmental dose-optimization program which includes automated exposure control, adjustment of the mA and/or kV according to patient size and/or use of iterative reconstruction technique. CONTRAST:  OMNIPAQUE IOHEXOL 300 MG/ML  SOLN COMPARISON:  01/08/2023 FINDINGS: Lower Chest: No acute findings. Stable small fat-containing left posterior diaphragmatic hernia incidentally noted. Hepatobiliary: No suspicious hepatic masses identified. Mild diffuse hepatic steatosis again noted. Prior cholecystectomy. No evidence of biliary obstruction. Pancreas:  No mass or inflammatory changes. Spleen: Within normal limits in size and appearance. Adrenals/Urinary Tract: No suspicious masses identified. No evidence of ureteral calculi or hydronephrosis. Stomach/Bowel: Small hiatal hernia seen. No evidence of obstruction, inflammatory process or abnormal fluid collections. Diverticulosis is seen mainly involving the sigmoid colon, however there is no evidence of diverticulitis. A small left lower quadrant spigelian hernia is again seen containing small bowel, however, there is no No evidence of bowel obstruction or strangulation. Vascular/Lymphatic: No pathologically enlarged lymph nodes. No acute vascular findings. Reproductive: Prior hysterectomy noted. Adnexal regions are unremarkable in appearance. Other:  None. Musculoskeletal: No suspicious bone lesions identified. Severe lumbar spine degenerative changes again noted. IMPRESSION: No acute findings. Colonic diverticulosis, without radiographic evidence of diverticulitis. Small hiatal hernia. 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 No evidence of bowel obstruction or strangulation. Mild hepatic steatosis. Electronically Signed   By: Danae Orleans M.D.    On: 04/10/2023 11:09    Review of Systems negative except above Blood pressure 123/83, pulse 73, temperature  98.5 F (36.9 C), temperature source Oral, resp. rate 16, SpO2 97%. Physical Exam vital signs stable afebrile no acute distress exam please see preassessment evaluation CT okay increased BUN and creatinine over baseline hemoglobin dropped from 11.5-8 and expected to drop further with hydration  Assessment/Plan: GI bleed in patient with history of ulcer on Eliquis Plan: Will proceed with endoscopy tomorrow in an effort to give her 2 days off her Eliquis and she is not sure if her son or daughter gives consent and will allow clear liquids today and continue pump inhibitors and please call sooner if GI question or problem arise  Hannie Shoe E 04/12/2023, 9:01 AM

## 2023-04-12 NOTE — Progress Notes (Signed)
No charge progress note.  Susan Ford is a 89 y.o. female with medical history significant for Alzheimer dementia, peripheral neuropathy, PUD, hospitalized in September with an acute PE, currently on Eliquis for 3 to 6 months who is being admitted with acute blood loss anemia due to suspected GI bleed.  Patient was seen in the ED the day prior on 12/15 for nausea and vomiting and had an unremarkable workup, was hydrated and discharged back to ALF.On 12/16 she she was noted to be incontinent of dark colored diarrhea and was sent back to the ED for reevaluation.   Of note, patient was hospitalized in March 2024 with melena and had EGD that showed a large nonbleeding prepyloric ulcer with gastritis. Daily Celebrex was held at the time.   On presentation vitals were normal, hemoglobin at 9.1, down from 11.8 on 12/15. Stool for occult blood positive. Urinalysis showing moderate leukocyte esterase.  Eliquis was held with consideration of discontinuing it.  Received 1 dose of ceftriaxone for concern of UTI.  12/17: Vital stable, hemoglobin decreased to 8.  Last dose of Eliquis was 12/16 a.m. ordered 1 unit of PRBC.  GI is planning EGD tomorrow morning.  Patient did not had any more melena.  She is a very hard of hearing lady but denies any pain.  Rest of the exam was benign.  Discussed with son on phone.

## 2023-04-12 NOTE — Assessment & Plan Note (Signed)
Continue gabapentin Avoid NSAIDs

## 2023-04-12 NOTE — Assessment & Plan Note (Signed)
History of peptic ulcer and melena 06/2022 (EGD nonbleeding prepyloric ulcer and gastritis) Chronic anticoagulation (history PE 12/2022) Hold Eliquis and consider discontinuation Serial H&H and transfuse if needed (EDP spoke with daughter and consented patient) Protonix IV twice daily N.p.o. except ice chips GI consult for possible EGD in the a.m.

## 2023-04-12 NOTE — Assessment & Plan Note (Signed)
Delirium precautions 

## 2023-04-12 NOTE — ED Notes (Addendum)
Son/daughter in law called. Updated on POC. NAD. All questions answered

## 2023-04-12 NOTE — Assessment & Plan Note (Deleted)
History of peptic ulcer and melena 06/2022 (EGD nonbleeding prepyloric ulcer and gastritis) Chronic anticoagulation (history PE 12/2022) Hold Eliquis and consider discontinuation Serial H&H and transfuse if needed (EDP spoke with daughter and consented patient) Protonix IV twice daily N.p.o. except ice chips GI consult for possible EGD in the a.m.

## 2023-04-12 NOTE — H&P (Signed)
History and Physical    Patient: Susan Ford:[id number] DOB: 02-03-1930 DOA: 04/11/2023 DOS: the patient was seen and examined on 04/12/2023 PCP: Madelin Headings, MD  Patient coming from: ALF/ILF  Chief Complaint:  Chief Complaint  Patient presents with   Transitions Of Care    HPI: Susan Ford is a 89 y.o. female with medical history significant for Alzheimer dementia, peripheral neuropathy, PUD, hospitalized in September with an acute PE, currently on Eliquis for 3 to 6 months who is being admitted with acute blood loss anemia due to suspected GI bleed.  Patient was seen in the ED the day prior on 12/15 for nausea and vomiting and had an unremarkable workup, was hydrated and discharged back to ALF.On 12/16 she she was noted to be incontinent of dark colored diarrhea and was sent back to the ED for reevaluation. She no longer has nausea and vomiting. No stool output since arrival. Patient unable to contribute to the history due to dementia.  Of note, patient was hospitalized in March 2024 with melena and had EGD that showed a large nonbleeding prepyloric ulcer with gastritis.  Daily Celebrex was held at the time. ED course and data review: Vitals within normal limits Labs notable for hemoglobin of 9.1, down from 11.8 on 12/15. Stool for occult blood positive. Urinalysis showing moderate leukocyte esterase  Patient started on IV Protonix and given a dose of ceftriaxone for possible UTI.   Hospitalist consulted for admission.       Past Medical History:  Diagnosis Date   Arthritis    Cataract 2007   bilateral cat ext    Cholecystitis    Complication of anesthesia Feb 21, 2014   atrial fib after cholecystectomy, lasted a few hours, none since   Dysrhythmia 02-21-2014   after cholecystectomy had atrial fib for a few hours, none since   GERD (gastroesophageal reflux disease)    Had endoscopy no history of Barrett's   Hx of nonmelanoma skin cancer    Unspecified  hereditary and idiopathic peripheral neuropathy    Past Surgical History:  Procedure Laterality Date   ABDOMINAL HYSTERECTOMY  15 yrs ago   complete   BIOPSY  07/09/2022   Procedure: BIOPSY;  Surgeon: Kathi Der, MD;  Location: [facility];  Service: Gastroenterology;;   BREAST CYST ASPIRATION     CHOLECYSTECTOMY N/A 02/21/2014   Procedure: LAPAROSCOPIC CHOLECYSTECTOMY WITH INTRAOPERATIVE CHOLANGIOGRAM;  Surgeon: Frederik Schmidt, MD;  Location: [facility] OR;  Service: General;  Laterality: N/A;   ENDOSCOPIC RETROGRADE CHOLANGIOPANCREATOGRAPHY (ERCP) WITH PROPOFOL N/A 05/29/2014   Procedure: ENDOSCOPIC RETROGRADE CHOLANGIOPANCREATOGRAPHY (ERCP) WITH PROPOFOL;  Surgeon: Willis Modena, MD;  Location: [facility];  Service: Endoscopy;  Laterality: N/A;   ERCP N/A 02/24/2014   Procedure: ENDOSCOPIC RETROGRADE CHOLANGIOPANCREATOGRAPHY (ERCP);  Surgeon: Willis Modena, MD;  Location: [facility] ENDOSCOPY;  Service: Endoscopy;  Laterality: N/A;   ESOPHAGOGASTRODUODENOSCOPY (EGD) WITH PROPOFOL N/A 07/09/2022   Procedure: ESOPHAGOGASTRODUODENOSCOPY (EGD) WITH PROPOFOL;  Surgeon: Kathi Der, MD;  Location: [facility];  Service: Gastroenterology;  Laterality: N/A;   EYE SURGERY Bilateral 2007   both eyes cataracts with lens replacments   LUMBAR LAMINECTOMY/DECOMPRESSION MICRODISCECTOMY  02/01/2012   Procedure: LUMBAR LAMINECTOMY/DECOMPRESSION MICRODISCECTOMY 2 LEVELS;  Surgeon: Barnett Abu, MD;  Location: [facility] NEURO ORS;  Service: Neurosurgery;  Laterality: Left;  Left Lumbar Four-Five Lumbar Five-Sacral One Laminectomies/Foraminotomies/Microscope   SPYGLASS CHOLANGIOSCOPY N/A 05/29/2014   Procedure: SPYGLASS CHOLANGIOSCOPY;  Surgeon: Willis Modena, MD;  Location: [facility];  Service: Endoscopy;  Laterality: N/A;  TONSILLECTOMY  age 89   Social History:  reports that she quit smoking about 60 years ago. Her smoking use included cigarettes. She started smoking about 67 years ago. She has a 1.8 pack-year  smoking history. She has never used smokeless tobacco. She reports current alcohol use of about 7.0 standard drinks of alcohol per week. She reports that she does not use drugs.  Allergies  Allergen Reactions   Keflex [Cephalexin]     Dizziness   Lyrica [Pregabalin] Other (See Comments)    "High Fever"    Oxytetracycline Other (See Comments)    "High Fever" caused by terramycin    Memantine Rash    Family History  Problem Relation Age of Onset   Neurodegenerative disease Mother        Shy Drager died at 9   Alcohol abuse Other    Heart disease Other     Prior to Admission medications   Medication Sig Start Date End Date Taking? Authorizing Provider  apixaban (ELIQUIS) 2.5 MG TABS tablet Take 1 tablet (2.5 mg total) by mouth 2 (two) times daily. For pulmonary embolism treatment prevention decrease dose. 04/01/23  Yes Panosh, Neta Mends, MD  Ascorbic Acid (VITAMIN C) 1000 MG tablet Take 1,000 mg by mouth daily.   Yes [provider]  gabapentin (NEURONTIN) 300 MG capsule Take 1 capsule (300 mg total) by mouth 4 (four) times daily. 11/03/22  Yes Panosh, Neta Mends, MD  acetaminophen (TYLENOL) 325 MG tablet Take 650 mg by mouth every 6 (six) hours as needed for moderate pain (pain score 4-6) or mild pain (pain score 1-3).    [provider]  Apoaequorin (PREVAGEN) 10 MG CAPS Take 10 mg by mouth daily.    [provider]  estradiol (CLIMARA - DOSED IN MG/24 HR) 0.025 mg/24hr patch PLACE 1 PATCH (0.025 MG TOTAL) ONTO THE SKIN ONCE A WEEK ON SATURDAY Patient taking differently: Place 0.025 mg onto the skin once a week. Sundays 01/13/23   Panosh, Neta Mends, MD  hydrocortisone cream 1 % Apply topically 2 (two) times daily as needed for itching. Patient not taking: Reported on 04/10/2023 01/01/23   Lanae Boast, MD  loratadine (ALLERGY RELIEF) 10 MG tablet Take 10 mg by mouth every evening. Patient not taking: Reported on 04/10/2023    [provider]  Multiple Vitamin  (MULTIVITAMIN) tablet Take 1 tablet by mouth daily after lunch.    [provider]  ondansetron (ZOFRAN-ODT) 4 MG disintegrating tablet Take 1 tablet (4 mg total) by mouth every 8 (eight) hours as needed. 04/10/23   Jacalyn Lefevre, MD  polyethylene glycol (MIRALAX / GLYCOLAX) 17 g packet Take 17 g by mouth daily. Patient taking differently: Take 17 g by mouth as needed for moderate constipation or mild constipation. 01/02/23   Lanae Boast, MD  senna (SENOKOT) 8.6 MG TABS tablet Take 1 tablet (8.6 mg total) by mouth daily. Patient taking differently: Take 2 tablets by mouth every other day. 01/02/23   Lanae Boast, MD  simvastatin (ZOCOR) 10 MG tablet Take 1 tablet (10 mg total) by mouth daily. 12/20/22   Madelin Headings, MD    Physical Exam: Vitals:   04/11/23 2040 04/11/23 2354 04/12/23 0004 04/12/23 0114  BP:  (!) 144/72    Pulse: 82 75    Resp:  16    Temp:   98.3 F (36.8 C) 98.2 F (36.8 C)  TempSrc:   Oral Oral  SpO2: 99% 99%     Physical  Exam Vitals and nursing note reviewed.  Constitutional:      General: She is not in acute distress. HENT:     Head: Normocephalic and atraumatic.  Cardiovascular:     Rate and Rhythm: Normal rate and regular rhythm.     Heart sounds: Normal heart sounds.  Pulmonary:     Effort: Pulmonary effort is normal.     Breath sounds: Normal breath sounds.  Abdominal:     Palpations: Abdomen is soft.     Tenderness: There is no abdominal tenderness.  Neurological:     Mental Status: Mental status is at baseline. She is disoriented.     Labs on Admission: I have personally reviewed following labs and imaging studies  CBC: Recent Labs  Lab 04/10/23 0923 04/11/23 2112  WBC 11.2* 7.8  NEUTROABS 9.6* 5.5  HGB 11.8* 9.1*  HCT 37.8 27.1*  MCV 96.9 95.1  PLT 265 197   Basic Metabolic Panel: Recent Labs  Lab 04/10/23 0923 04/11/23 2112  NA 133* 138  K 4.9 3.7  CL 99 109  CO2 26 23  GLUCOSE 133* 125*  BUN 35* 33*  CREATININE 0.71  0.51  CALCIUM 9.3 8.3*   GFR: Estimated Creatinine Clearance: 39.6 mL/min (by C-G formula based on SCr of 0.51 mg/dL). Liver Function Tests: Recent Labs  Lab 04/10/23 0923 04/11/23 2112  AST 24 22  ALT 16 13  ALKPHOS 52 39  BILITOT 1.2* 0.7  PROT 6.2* 5.7*  ALBUMIN 3.2* 3.1*   Recent Labs  Lab 04/10/23 0923  LIPASE 27   No results for input(s): "AMMONIA" in the last 168 hours. Coagulation Profile: Recent Labs  Lab 04/11/23 2112  INR 1.0   Cardiac Enzymes: No results for input(s): "CKTOTAL", "CKMB", "CKMBINDEX", "TROPONINI" in the last 168 hours. BNP (last 3 results) No results for input(s): "PROBNP" in the last 8760 hours. HbA1C: No results for input(s): "HGBA1C" in the last 72 hours. CBG: No results for input(s): "GLUCAP" in the last 168 hours. Lipid Profile: No results for input(s): "CHOL", "HDL", "LDLCALC", "TRIG", "CHOLHDL", "LDLDIRECT" in the last 72 hours. Thyroid Function Tests: No results for input(s): "TSH", "T4TOTAL", "FREET4", "T3FREE", "THYROIDAB" in the last 72 hours. Anemia Panel: No results for input(s): "VITAMINB12", "FOLATE", "FERRITIN", "TIBC", "IRON", "RETICCTPCT" in the last 72 hours. Urine analysis:    Component Value Date/Time   COLORURINE YELLOW 04/11/2023 2219   APPEARANCEUR HAZY (A) 04/11/2023 2219   LABSPEC 1.019 04/11/2023 2219   PHURINE 7.0 04/11/2023 2219   GLUCOSEU NEGATIVE 04/11/2023 2219   GLUCOSEU NEGATIVE 02/26/2016 1031   HGBUR NEGATIVE 04/11/2023 2219   BILIRUBINUR NEGATIVE 04/11/2023 2219   BILIRUBINUR neg 05/05/2021 1059   KETONESUR 20 (A) 04/11/2023 2219   PROTEINUR NEGATIVE 04/11/2023 2219   UROBILINOGEN 0.2 01/23/2022 1345   NITRITE NEGATIVE 04/11/2023 2219   LEUKOCYTESUR MODERATE (A) 04/11/2023 2219    Radiological Exams on Admission: CT ABDOMEN PELVIS W CONTRAST Result Date: 04/10/2023 CLINICAL DATA:  Acute abdominal pain, nausea and vomiting beginning last night. EXAM: CT ABDOMEN AND PELVIS WITH CONTRAST  TECHNIQUE: Multidetector CT imaging of the abdomen and pelvis was performed using the standard protocol following bolus administration of intravenous contrast. RADIATION DOSE REDUCTION: This exam was performed according to the departmental dose-optimization program which includes automated exposure control, adjustment of the mA and/or kV according to patient size and/or use of iterative reconstruction technique. CONTRAST:  OMNIPAQUE IOHEXOL 300 MG/ML  SOLN COMPARISON:  01/08/2023 FINDINGS: Lower Chest: No acute findings. Stable small fat-containing  left posterior diaphragmatic hernia incidentally noted. Hepatobiliary: No suspicious hepatic masses identified. Mild diffuse hepatic steatosis again noted. Prior cholecystectomy. No evidence of biliary obstruction. Pancreas:  No mass or inflammatory changes. Spleen: Within normal limits in size and appearance. Adrenals/Urinary Tract: No suspicious masses identified. No evidence of ureteral calculi or hydronephrosis. Stomach/Bowel: Small hiatal hernia seen. No evidence of obstruction, inflammatory process or abnormal fluid collections. Diverticulosis is seen mainly involving the sigmoid colon, however there is no evidence of diverticulitis. A small left lower quadrant spigelian hernia is again seen containing small bowel, however, there is no No evidence of bowel obstruction or strangulation. Vascular/Lymphatic: No pathologically enlarged lymph nodes. No acute vascular findings. Reproductive: Prior hysterectomy noted. Adnexal regions are unremarkable in appearance. Other:  None. Musculoskeletal: No suspicious bone lesions identified. Severe lumbar spine degenerative changes again noted. IMPRESSION: No acute findings. Colonic diverticulosis, without radiographic evidence of diverticulitis. Small hiatal hernia. 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 No evidence of bowel obstruction or strangulation. Mild hepatic steatosis. Electronically  Signed   By: Danae Orleans M.D.   On: 04/10/2023 11:09     Data Reviewed: Relevant notes from primary care and specialist visits, past discharge summaries as available in EHR, including Care Everywhere. Prior diagnostic testing as pertinent to current admission diagnoses Updated medications and problem lists for reconciliation ED course, including vitals, labs, imaging, treatment and response to treatment Triage notes, nursing and pharmacy notes and ED provider's notes Notable results as noted in HPI   Assessment and Plan: Melena, with acute blood loss anemia History of peptic ulcer and melena 06/2022 (EGD nonbleeding prepyloric ulcer and gastritis) Chronic anticoagulation (history PE 12/2022) Hold Eliquis and consider discontinuation Serial H&H and transfuse if needed (EDP spoke with daughter and consented patient) Protonix IV twice daily N.p.o. except ice chips GI consult for possible EGD in the a.m.   Dementia without behavioral disturbance (HCC) Delirium precautions  Neuropathy, peripheral Continue gabapentin Avoid NSAIDs        DVT prophylaxis: SCD  Consults: [facility]  Advance Care Planning:   Code Status: Prior   Family Communication: none  Disposition Plan: Back to previous home environment  Severity of Illness: The appropriate patient status for this patient is INPATIENT. Inpatient status is judged to be reasonable and necessary in order to provide the required intensity of service to ensure the patient's safety. The patient's presenting symptoms, physical exam findings, and initial radiographic and laboratory data in the context of their chronic comorbidities is felt to place them at high risk for further clinical deterioration. Furthermore, it is not anticipated that the patient will be medically stable for discharge from the hospital within 2 midnights of admission.   * I certify that at the point of admission it is my clinical judgment that the  patient will require inpatient hospital care spanning beyond 2 midnights from the point of admission due to high intensity of service, high risk for further deterioration and high frequency of surveillance required.*  Author: Andris Baumann, MD 04/12/2023 1:17 AM  For on call review [email or web address].

## 2023-04-12 NOTE — Hospital Course (Addendum)
Taken from H&P.   Susan Ford is a 89 y.o. female with medical history significant for Alzheimer dementia, peripheral neuropathy, PUD, hospitalized in September with an acute PE, currently on Eliquis for 3 to 6 months who is being admitted with acute blood loss anemia due to suspected GI bleed.  Patient was seen in the ED the day prior on 12/15 for nausea and vomiting and had an unremarkable workup, was hydrated and discharged back to ALF.On 12/16 she she was noted to be incontinent of dark colored diarrhea and was sent back to the ED for reevaluation.   Of note, patient was hospitalized in March 2024 with melena and had EGD that showed a large nonbleeding prepyloric ulcer with gastritis. Daily Celebrex was held at the time.   On presentation vitals were normal, hemoglobin at 9.1, down from 11.8 on 12/15. Stool for occult blood positive. Urinalysis showing moderate leukocyte esterase.  Eliquis was held with consideration of discontinuing it.  Received 1 dose of ceftriaxone for concern of UTI.  12/17: Vital stable, hemoglobin decreased to 8.  Last dose of Eliquis was 12/16 a.m. ordered 1 unit of PRBC.  GI is planning EGD tomorrow morning.

## 2023-04-13 ENCOUNTER — Encounter (HOSPITAL_COMMUNITY)
Admission: EM | Disposition: A | Discharge status: Home / Self Care | Payer: Self-pay | Source: Home / Self Care | Attending: Family Medicine

## 2023-04-13 ENCOUNTER — Encounter (HOSPITAL_COMMUNITY): Payer: Self-pay | Admitting: Internal Medicine

## 2023-04-13 ENCOUNTER — Inpatient Hospital Stay (HOSPITAL_COMMUNITY): Payer: Medicare Other

## 2023-04-13 ENCOUNTER — Inpatient Hospital Stay (HOSPITAL_COMMUNITY): Payer: Self-pay

## 2023-04-13 DIAGNOSIS — E785 Hyperlipidemia, unspecified: Secondary | ICD-10-CM | POA: Diagnosis not present

## 2023-04-13 DIAGNOSIS — K922 Gastrointestinal hemorrhage, unspecified: Secondary | ICD-10-CM | POA: Diagnosis not present

## 2023-04-13 DIAGNOSIS — K921 Melena: Secondary | ICD-10-CM | POA: Diagnosis not present

## 2023-04-13 DIAGNOSIS — Z8711 Personal history of peptic ulcer disease: Secondary | ICD-10-CM

## 2023-04-13 DIAGNOSIS — D62 Acute posthemorrhagic anemia: Secondary | ICD-10-CM | POA: Diagnosis not present

## 2023-04-13 DIAGNOSIS — K259 Gastric ulcer, unspecified as acute or chronic, without hemorrhage or perforation: Secondary | ICD-10-CM | POA: Diagnosis not present

## 2023-04-13 HISTORY — PX: ESOPHAGOGASTRODUODENOSCOPY (EGD) WITH PROPOFOL: SHX5813

## 2023-04-13 HISTORY — PX: BIOPSY: SHX5522

## 2023-04-13 LAB — TYPE AND SCREEN
ABO/RH(D): O POS
Antibody Screen: NEGATIVE
Unit division: 0

## 2023-04-13 LAB — BASIC METABOLIC PANEL
Anion gap: 4 — ABNORMAL LOW (ref 5–15)
BUN: 22 mg/dL (ref 8–23)
CO2: 24 mmol/L (ref 22–32)
Calcium: 8.1 mg/dL — ABNORMAL LOW (ref 8.9–10.3)
Chloride: 110 mmol/L (ref 98–111)
Creatinine, Ser: 0.65 mg/dL (ref 0.44–1.00)
GFR, Estimated: >60 mL/min (ref >60)
Glucose, Bld: 106 mg/dL — ABNORMAL HIGH (ref 70–99)
Potassium: 3.7 mmol/L (ref 3.5–5.1)
Sodium: 138 mmol/L (ref 135–145)

## 2023-04-13 LAB — BPAM RBC
Blood Product Expiration Date: 202501042359
ISSUE DATE / TIME: 202412171056
Unit Type and Rh: 5100

## 2023-04-13 SURGERY — ESOPHAGOGASTRODUODENOSCOPY (EGD) WITH PROPOFOL
Anesthesia: Monitor Anesthesia Care

## 2023-04-13 MED ORDER — SIMVASTATIN 10 MG PO TABS
10.0000 mg | ORAL_TABLET | Freq: Every day | ORAL | Status: DC
  Administered 2023-04-13 – 2023-04-14 (×2): 10 mg via ORAL
  Filled 2023-04-13 (×2): qty 1

## 2023-04-13 MED ORDER — PROPOFOL 500 MG/50ML IV EMUL
INTRAVENOUS | Status: DC | PRN
  Administered 2023-04-13: 30 mg via INTRAVENOUS
  Administered 2023-04-13: 80 mg via INTRAVENOUS
  Administered 2023-04-13 (×2): 30 mg via INTRAVENOUS

## 2023-04-13 MED ORDER — PROPOFOL 10 MG/ML IV BOLUS
INTRAVENOUS | Status: AC
  Filled 2023-04-13: qty 20

## 2023-04-13 MED ORDER — SODIUM CHLORIDE 0.9 % IV SOLN: INTRAVENOUS | Status: DC | PRN

## 2023-04-13 MED ORDER — LIDOCAINE 2% (20 MG/ML) 5 ML SYRINGE
INTRAMUSCULAR | Status: DC | PRN
  Administered 2023-04-13: 60 mg via INTRAVENOUS

## 2023-04-13 SURGICAL SUPPLY — 14 items

## 2023-04-13 NOTE — Progress Notes (Signed)
Susan Ford 10:44 AM  Subjective: Patient doing better without signs of bleeding and no new complaints  Objective: Vital signs stable afebrile exam please see preassessment evaluation BUN decreased hemoglobin stable  Assessment: GI bleeding in a patient with history of ulcers and on blood thinner  Plan: Okay to proceed with endoscopy with anesthesia assistance  [facility]  office [phone] After 5PM or if no answer call [phone]

## 2023-04-13 NOTE — Plan of Care (Signed)
  Problem: Clinical Measurements: Goal: Will remain free from infection Outcome: Progressing Goal: Diagnostic test results will improve Outcome: Progressing Goal: Respiratory complications will improve Outcome: Progressing Goal: Cardiovascular complication will be avoided Outcome: Progressing   Problem: Activity: Goal: Risk for activity intolerance will decrease Outcome: Progressing   Problem: Nutrition: Goal: Adequate nutrition will be maintained Outcome: Progressing   Problem: Coping: Goal: Level of anxiety will decrease Outcome: Progressing   Problem: Elimination: Goal: Will not experience complications related to bowel motility Outcome: Progressing Goal: Will not experience complications related to urinary retention Outcome: Progressing   Problem: Pain Management: Goal: General experience of comfort will improve Outcome: Progressing   Problem: Safety: Goal: Ability to remain free from injury will improve Outcome: Progressing   Problem: Skin Integrity: Goal: Risk for impaired skin integrity will decrease Outcome: Progressing

## 2023-04-13 NOTE — Transfer of Care (Signed)
Immediate Anesthesia Transfer of Care Note  Patient: Susan Ford  Procedure(s) Performed: ESOPHAGOGASTRODUODENOSCOPY (EGD) WITH PROPOFOL BIOPSY  Patient Location: PACU  Anesthesia Type:MAC  Level of Consciousness: drowsy  Airway & Oxygen Therapy: Patient Spontanous Breathing  Post-op Assessment: Report given to RN  Post vital signs: Reviewed and stable  Last Vitals:  Vitals Value Taken Time  BP 95/44 04/13/23 1120  Temp    Pulse 65 04/13/23 1120  Resp 19 04/13/23 1120  SpO2 99 % 04/13/23 1120  Vitals shown include unfiled device data.  Last Pain:  Vitals:   04/13/23 1006  TempSrc: Temporal  PainSc: 7          Complications: No notable events documented.

## 2023-04-13 NOTE — Progress Notes (Signed)
[facility]  PROGRESS NOTE  Susan Ford ZOX:[id number] DOB: [birth year] DOA: 04/11/2023 PCP: Madelin Headings, MD   Brief HPI:   89 y.o. female with medical history significant for Alzheimer dementia, peripheral neuropathy, PUD, hospitalized in September with an acute PE, currently on Eliquis for 3 to 6 months who is being admitted with acute blood loss anemia due to suspected GI bleed.  Patient was seen in the ED the day prior on 12/15 for nausea and vomiting and had an unremarkable workup, was hydrated and discharged back to ALF.On 12/16 she she was noted to be incontinent of dark colored diarrhea and was sent back to the ED for reevaluation.    Of note, patient was hospitalized in March 2024 with melena and had EGD that showed a large nonbleeding prepyloric ulcer with gastritis. Daily Celebrex was held at the time.     Assessment/Plan:    Melena, with acute blood loss anemia History of peptic ulcer and melena 06/2022 (EGD nonbleeding prepyloric ulcer and gastritis) Chronic anticoagulation (history PE 12/2022) -Eliquis on hold -Gastroenterology consulted for EGD today -Follow CBC in a.m.  Dementia without behavioral disturbance (HCC) Delirium precautions   Neuropathy, peripheral Continue gabapentin Avoid NSAIDs     Medications     sodium chloride   Intravenous Once   gabapentin  300 mg Oral QID   pantoprazole (PROTONIX) IV  40 mg Intravenous Q12H     Data Reviewed:   CBG:  No results for input(s): "GLUCAP" in the last 168 hours.  SpO2: 95 %    Vitals:   04/12/23 2100 04/13/23 0035 04/13/23 0519 04/13/23 0844  BP: 124/63 (!) 115/53 131/63   Pulse: (!) 59 (!) 48 69   Resp: 17 16 17    Temp: 98.3 F (36.8 C) 97.7 F (36.5 C) 97.7 F (36.5 C)   TempSrc: Oral Oral Oral   SpO2: 96% 96% 95%   Weight:    55.3 kg  Height:    5\' 4"  (1.626 m)      Data Reviewed:  Basic Metabolic Panel: Recent Labs  Lab 04/10/23 0923 04/11/23 2112 04/13/23 0559   NA 133* 138 138  K 4.9 3.7 3.7  CL 99 109 110  CO2 26 23 24   GLUCOSE 133* 125* 106*  BUN 35* 33* 22  CREATININE 0.71 0.51 0.65  CALCIUM 9.3 8.3* 8.1*    CBC: Recent Labs  Lab 04/10/23 0923 04/11/23 2112 04/12/23 0642 04/12/23 1600 04/12/23 1724  WBC 11.2* 7.8 7.0 6.2 6.2  NEUTROABS 9.6* 5.5  --   --   --   HGB 11.8* 9.1* 8.0* 9.5* 10.2*  HCT 37.8 27.1* 23.7* 28.8* 32.3*  MCV 96.9 95.1 96.3 94.4 101.9*  PLT 265 197 180 174 156    LFT Recent Labs  Lab 04/10/23 0923 04/11/23 2112  AST 24 22  ALT 16 13  ALKPHOS 52 39  BILITOT 1.2* 0.7  PROT 6.2* 5.7*  ALBUMIN 3.2* 3.1*     Antibiotics: Anti-infectives (From admission, onward)    Start     Dose/Rate Route Frequency Ordered Stop   04/12/23 0015  cefTRIAXone (ROCEPHIN) 1 g in sodium chloride 0.9 % 100 mL IVPB        1 g 200 mL/hr over 30 Minutes Intravenous  Once 04/12/23 0014 04/12/23 0211        DVT prophylaxis: SCDs  Code Status: Full code  Family Communication: No family at bedside   CONSULTS    Subjective   Denies any  complaints   Objective    Physical Examination:   General-appears in no acute distress Heart-S1-S2, regular, no murmur auscultated Lungs-clear to auscultation bilaterally, no wheezing or crackles auscultated Abdomen-soft, nontender, no organomegaly Extremities-no edema in the lower extremities Neuro-alert, oriented x3, no focal deficit noted  Status is: Inpatient:             Meredeth Ide   [facility] If 7PM-7AM, please contact night-coverage at [email or web address], Office  [phone]   04/13/2023, 8:49 AM  LOS: 1 day

## 2023-04-13 NOTE — Progress Notes (Signed)
PT Cancellation Note  Patient Details Name: Susan Ford MRN: [id number] DOB: [birth year]   Cancelled Treatment:    Reason Eval/Treat Not Completed: Patient at procedure or test/unavailable. Pt preparing to go to endo, will continue to follow for PT eval.    Tori Daniyal Tabor PT, DPT 04/13/23, 9:00 AM

## 2023-04-13 NOTE — Evaluation (Signed)
Physical Therapy Evaluation Patient Details Name: Susan Ford MRN: [id number] DOB: 1929/10/03 Today's Date: 04/13/2023  History of Present Illness  Susan Ford is a 89 y.o. female presents for evaluation of fatigue and worsening mental status; admitted with acute blood loss anemia due to suspected GI bleed. Pt underwent upper GI endoscopy 04/13/23. PMH: PE, GERD, skin cancer, dementia, peripheral neuropathy  Clinical Impression  Pt admitted with above diagnosis. Pt from [facility], reports using RW at baseline ambulating throughout facility, to dining hall, ind with self care but also reports staff is present. Pt able to follow commands on evaluation. Pt comes to sitting EOB with increased time and effort, using bedrail to assist trunk and scoot out to EOB. Pt powers to stand with min A to steady and amb short distance in room with RW, min A for general unsteadiness and cues for RW management to improve positioning and safety with AD. Recommend SNF vs ALF with HHPT pending improvement in hospital and assistance at ALF. Pt currently with functional limitations due to the deficits listed below (see PT Problem List). Pt will benefit from acute skilled PT to increase their independence and safety with mobility to allow discharge.           If plan is discharge home, recommend the following: A little help with walking and/or transfers;A little help with bathing/dressing/bathroom;Assistance with cooking/housework;Assist for transportation   Can travel by private vehicle   Yes    Equipment Recommendations None recommended by PT  Recommendations for Other Services       Functional Status Assessment Patient has had a recent decline in their functional status and demonstrates the ability to make significant improvements in function in a reasonable and predictable amount of time.     Precautions / Restrictions Precautions Precautions: Fall Restrictions Weight Bearing  Restrictions Per Provider Order: No      Mobility  Bed Mobility Overal bed mobility: Needs Assistance Bed Mobility: Supine to Sit, Sit to Supine     Supine to sit: Contact guard, HOB elevated, Used rails Sit to supine: Contact guard assist   General bed mobility comments: increased time and effort, therapist managing IV line for safety, use of bedrail to come to sitting EOB    Transfers Overall transfer level: Needs assistance Equipment used: Rolling walker (2 wheels) Transfers: Sit to/from Stand Sit to Stand: Min assist           General transfer comment: min A to steady wtih powering up to stand, BUE assisting to rise    Ambulation/Gait Ambulation/Gait assistance: Min assist Gait Distance (Feet): 15 Feet Assistive device: Rolling walker (2 wheels) Gait Pattern/deviations: Step-through pattern, Decreased stride length, Trunk flexed Gait velocity: decreased     General Gait Details: step through gait pattern with limited steps in room, pt declines further amb due to hot coffee being present, no LE buckling or overt LOB noted, trunk forward flexed, min A for general unsteadiness and pt needing verbal cues for body positioning and RW management  Stairs            Wheelchair Mobility     Tilt Bed    Modified Rankin (Stroke Patients Only)       Balance Overall balance assessment: Needs assistance Sitting-balance support: Feet supported Sitting balance-Leahy Scale: Good     Standing balance support: During functional activity, Bilateral upper extremity supported, Reliant on assistive device for balance Standing balance-Leahy Scale: Poor  Pertinent Vitals/Pain Pain Assessment Pain Assessment: No/denies pain    Home Living Family/patient expects to be discharged to:: Assisted living ([facility])                 Home Equipment: Agricultural consultant (2 wheels);Cane - single point      Prior Function Prior  Level of Function : Independent/Modified Independent;Needs assist             Mobility Comments: pt reports uses RW for amb in facility ADLs Comments: pt reports ind with self care but also states facility assists as needed     Extremity/Trunk Assessment   Upper Extremity Assessment Upper Extremity Assessment: Overall WFL for tasks assessed    Lower Extremity Assessment Lower Extremity Assessment: Generalized weakness (AROM WFL, strength grossly 3+/5, denies numbness/tingling)    Cervical / Trunk Assessment Cervical / Trunk Assessment: Kyphotic  Communication   Communication Communication: No apparent difficulties  Cognition Arousal: Alert Behavior During Therapy: WFL for tasks assessed/performed Overall Cognitive Status: History of cognitive impairments - at baseline                                          General Comments      Exercises     Assessment/Plan    PT Assessment Patient needs continued PT services  PT Problem List Decreased strength;Decreased activity tolerance;Decreased balance;Decreased mobility;Decreased cognition;Decreased safety awareness       PT Treatment Interventions DME instruction;Gait training;Functional mobility training;Therapeutic activities;Therapeutic exercise;Balance training;Cognitive remediation;Patient/family education    PT Goals (Current goals can be found in the Care Plan section)  Acute Rehab PT Goals Patient Stated Goal: return to [facility] PT Goal Formulation: With patient Time For Goal Achievement: 04/27/23 Potential to Achieve Goals: Good    Frequency Min 1X/week     Co-evaluation               AM-PAC PT "6 Clicks" Mobility  Outcome Measure Help needed turning from your back to your side while in a flat bed without using bedrails?: A Little Help needed moving from lying on your back to sitting on the side of a flat bed without using bedrails?: A Little Help needed moving to and from a  bed to a chair (including a wheelchair)?: A Little Help needed standing up from a chair using your arms (e.g., wheelchair or bedside chair)?: A Little Help needed to walk in hospital room?: A Little Help needed climbing 3-5 steps with a railing? : A Lot 6 Click Score: 17    End of Session Equipment Utilized During Treatment: Gait belt Activity Tolerance: Patient tolerated treatment well Patient left: in bed;with call bell/phone within reach;with bed alarm set Nurse Communication: Mobility status PT Visit Diagnosis: Other abnormalities of gait and mobility (R26.89);Muscle weakness (generalized) (M62.81)    Time: 6045-4098 PT Time Calculation (min) (ACUTE ONLY): 13 min   Charges:   PT Evaluation $PT Eval Low Complexity: 1 Low   PT General Charges $$ ACUTE PT VISIT: 1 Visit         Tori Aishia Barkey PT, DPT 04/13/23, 1:51 PM

## 2023-04-13 NOTE — Plan of Care (Signed)

## 2023-04-13 NOTE — Op Note (Signed)
[facility] Patient Name: Susan Ford Procedure Date: 04/13/2023 MRN: [id number] Attending MD: Vida Rigger , MD, [id number] Date of Birth: 1929/05/18 CSN: [id number] Age: 89 Admit Type: Inpatient Procedure:                Upper GI endoscopy Indications:              Melena Providers:                Vida Rigger, MD, Lorenza Evangelist, RN, Sunday Corn                            Mbumina, Technician Referring MD:              Medicines:                Monitored Anesthesia Care Complications:            No immediate complications. Estimated Blood Loss:     Estimated blood loss: none. Procedure:                Pre-Anesthesia Assessment:                           - Prior to the procedure, a History and Physical                            was performed, and patient medications and                            allergies were reviewed. The patient's tolerance of                            previous anesthesia was also reviewed. The risks                            and benefits of the procedure and the sedation                            options and risks were discussed with the patient.                            All questions were answered, and informed consent                            was obtained. Prior Anticoagulants: The patient has                            taken Eliquis (apixaban), last dose was 2 days                            prior to procedure. ASA Grade Assessment: III - A                            patient with severe systemic disease. After  reviewing the risks and benefits, the patient was                            deemed in satisfactory condition to undergo the                            procedure.                           After obtaining informed consent, the endoscope was                            passed under direct vision. Throughout the                            procedure, the patient's blood pressure, pulse, and                             oxygen saturations were monitored continuously. The                            [id number]-[id number] ([id number]) Olympus endoscope was introduced                            through the mouth, and advanced to the fourth part                            of duodenum. The upper GI endoscopy was                            accomplished without difficulty. The patient                            tolerated the procedure well. Scope In: Scope Out: Findings:      The larynx was normal.      The examined esophagus was normal.      One non-bleeding cratered gastric ulcer with no stigmata of bleeding was       found in the prepyloric region of the stomach. Biopsies were taken with       a cold forceps for histology. It was smaller than it was on last       endoscopy      A large non-bleeding diverticulum was found in the second portion of the       duodenum and in the third portion of the duodenum.      The duodenal bulb, first portion of the duodenum and fourth portion of       the duodenum were normal.      The exam was otherwise without abnormality. Impression:               - Normal larynx.                           - Normal esophagus.                           - Non-bleeding gastric  ulcer with no stigmata of                            bleeding. Biopsied.                           - Non-bleeding duodenal diverticulum.                           - Normal duodenal bulb, first portion of the                            duodenum and fourth portion of the duodenum.                           - The examination was otherwise normal. Moderate Sedation:      Not Applicable - Patient had care per Anesthesia. Recommendation:           - Soft diet today.                           - Continue present medications.                           - Use Protonix (pantoprazole) 40 mg PO daily                            indefinitely. There was a question of whether she                            was on this or not at home                            - Return to GI clinic PRN. And please call us if we                            can be of any further assistance with this hospital                            stay                           - Telephone GI clinic if symptomatic PRN.                           - Telephone GI clinic for pathology results in 1                            week.                           - Resume Eliquis (apixaban) at prior dose in 1 week                            if it is absolutely needed but I would reevaluate  blood thinner needs and use as lower dose as                            possible in the future Procedure Code(s):        --- Professional ---                           [phone], Esophagogastroduodenoscopy, flexible,                            transoral; with biopsy, single or multiple Diagnosis Code(s):        --- Professional ---                           K25.9, Gastric ulcer, unspecified as acute or                            chronic, without hemorrhage or perforation                           K92.1, Melena (includes Hematochezia)                           K57.10, Diverticulosis of small intestine without                            perforation or abscess without bleeding CPT copyright 2022 American Medical Association. All rights reserved. The codes documented in this report are preliminary and upon coder review may  be revised to meet current compliance requirements. Vida Rigger, MD 04/13/2023 11:25:09 AM This report has been signed electronically. Number of Addenda: 0

## 2023-04-13 NOTE — Anesthesia Preprocedure Evaluation (Signed)
Anesthesia Evaluation  Patient identified by MRN, date of birth, ID band Patient awake    Reviewed: Allergy & Precautions, H&P , NPO status , Patient's Chart, lab work & pertinent test results  Airway Mallampati: II   Neck ROM: full    Dental   Pulmonary former smoker, PE PE in September 2024.  On Eliquis   breath sounds clear to auscultation       Cardiovascular + DVT  (-) dysrhythmias  Rhythm:regular Rate:Normal     Neuro/Psych  PSYCHIATRIC DISORDERS     Dementia    GI/Hepatic ,GERD  ,,GI bleed.  H/o PUD.   Endo/Other    Renal/GU      Musculoskeletal  (+) Arthritis ,    Abdominal   Peds  Hematology  (+) Blood dyscrasia, anemia Hemoglobin 10.2   Anesthesia Other Findings   Reproductive/Obstetrics                             Anesthesia Physical Anesthesia Plan  ASA: 3  Anesthesia Plan: MAC   Post-op Pain Management:    Induction: Intravenous  PONV Risk Score and Plan: 2 and Propofol infusion and Treatment may vary due to age or medical condition  Airway Management Planned: Nasal Cannula  Additional Equipment:   Intra-op Plan:   Post-operative Plan:   Informed Consent: I have reviewed the patients History and Physical, chart, labs and discussed the procedure including the risks, benefits and alternatives for the proposed anesthesia with the patient or authorized representative who has indicated his/her understanding and acceptance.     Dental advisory given  Plan Discussed with: CRNA, Anesthesiologist and Surgeon  Anesthesia Plan Comments:        Anesthesia Quick Evaluation

## 2023-04-14 DIAGNOSIS — D62 Acute posthemorrhagic anemia: Secondary | ICD-10-CM | POA: Diagnosis not present

## 2023-04-14 DIAGNOSIS — K921 Melena: Secondary | ICD-10-CM | POA: Diagnosis not present

## 2023-04-14 DIAGNOSIS — K922 Gastrointestinal hemorrhage, unspecified: Secondary | ICD-10-CM | POA: Diagnosis not present

## 2023-04-14 DIAGNOSIS — Z8711 Personal history of peptic ulcer disease: Secondary | ICD-10-CM | POA: Diagnosis not present

## 2023-04-14 LAB — CBC
HCT: 30.2 % — ABNORMAL LOW (ref 36.0–46.0)
Hemoglobin: 9.7 g/dL — ABNORMAL LOW (ref 12.0–15.0)
MCH: 30.9 pg (ref 26.0–34.0)
MCHC: 32.1 g/dL (ref 30.0–36.0)
MCV: 96.2 fL (ref 80.0–100.0)
Platelets: 173 10*3/uL (ref 150–400)
RBC: 3.14 MIL/uL — ABNORMAL LOW (ref 3.87–5.11)
RDW: 13.5 % (ref 11.5–15.5)
WBC: 6.2 10*3/uL (ref 4.0–10.5)
nRBC: 0 % (ref 0.0–0.2)

## 2023-04-14 LAB — URINE CULTURE: Culture: >=100000 — AB

## 2023-04-14 LAB — BASIC METABOLIC PANEL
Anion gap: 7 (ref 5–15)
BUN: 20 mg/dL (ref 8–23)
CO2: 23 mmol/L (ref 22–32)
Calcium: 8.1 mg/dL — ABNORMAL LOW (ref 8.9–10.3)
Chloride: 109 mmol/L (ref 98–111)
Creatinine, Ser: 0.58 mg/dL (ref 0.44–1.00)
GFR, Estimated: >60 mL/min (ref >60)
Glucose, Bld: 97 mg/dL (ref 70–99)
Potassium: 3.1 mmol/L — ABNORMAL LOW (ref 3.5–5.1)
Sodium: 139 mmol/L (ref 135–145)

## 2023-04-14 LAB — SURGICAL PATHOLOGY

## 2023-04-14 MED ORDER — POTASSIUM CHLORIDE CRYS ER 20 MEQ PO TBCR
40.0000 meq | EXTENDED_RELEASE_TABLET | Freq: Two times a day (BID) | ORAL | Status: DC
  Administered 2023-04-14 – 2023-04-15 (×3): 40 meq via ORAL
  Filled 2023-04-14 (×3): qty 2

## 2023-04-14 NOTE — Progress Notes (Signed)
Susan Ford 2:35 PM  Subjective: Patient doing well without any complaints and we answered all her questions about the endoscopy and went over the report and discussed the biopsy results as well and talked about the need for her stomach medicine long-term  Objective: Vital signs stable afebrile no acute distress abdomen is soft nontender BUN and creatinine fine CBC okay  Assessment: Peripyloric ulcer patient on blood thinners  Plan: Long-term pump inhibitors once a day reevaluate blood thinner needs and happy to see back as an outpatient as needed and please call me if I could be of any further assistance with this hospital stay  [facility]  office [phone] After 5PM or if no answer call [phone]

## 2023-04-14 NOTE — TOC Initial Note (Signed)
Transition of Care [facility]) - Initial/Assessment Note    Patient Details  Name: Susan Ford MRN: [id number] Date of Birth: 1929/12/30  [facility] CM/SW Contact:    Beckie Busing, RN Phone Number:[phone]  04/14/2023, 1:41 PM  Clinical Narrative:                 TOC following patient with SNF / ALF recommendation. CM at bedside to discuss plan of care for discharge. Patient has declined any SNF/ALF and states that she will be going back home to her independent living apartment at [facility]. Patient has no preference for [facility] services and would like to use the services provided by [facility]. HH referral info has been sent to [facility] with [employer]. Cm has also confirmed that patient will have extra support through [facility] to assist with providing safety services and checks. Currently there are no other needs noted . TOC will continue to follow.   Expected Discharge Plan:  (Independent living at [facility]) Barriers to Discharge: Continued Medical Work up   Patient Goals and CMS Choice Patient states their goals for this hospitalization and ongoing recovery are:: Wants to go back to indepent living CMS Medicare.gov Compare Post Acute Care list provided to:: Patient Choice offered to / list presented to : Patient [place] ownership interest in [facility].provided to::  (n/a)    Expected Discharge Plan and Services In-house Referral: NA Discharge Planning Services: CM Consult Post Acute Care Choice: Home Health Living arrangements for the past 2 months: Apartment                 DME Arranged: N/A DME Agency: NA       HH Arranged: PT HH Agency: Other - See comment International aid/development worker) Date HH Agency Contacted: 04/14/23 Time HH Agency Contacted: 1338 Representative spoke with at [facility] Agency: Rene Kocher  Prior Living Arrangements/Services Living arrangements for the past 2 months: Apartment Lives with:: Self Patient language and need for interpreter  reviewed:: Yes Do you feel safe going back to the place where you live?: Yes      Need for Family Participation in Patient Care: Yes (Comment) Care giver support system in place?: Yes (comment) Current home services: DME (rolling walker) Criminal Activity/Legal Involvement Pertinent to Current Situation/Hospitalization: No - Comment as needed  Activities of Daily Living   ADL Screening (condition at time of admission) Independently performs ADLs?: Yes (appropriate for developmental age) Is the patient deaf or have difficulty hearing?: Yes Does the patient have difficulty seeing, even when wearing glasses/contacts?: No Does the patient have difficulty concentrating, remembering, or making decisions?: No  Permission Sought/Granted Permission sought to share information with : Family Supports Permission granted to share information with : No              Emotional Assessment Appearance:: Appears stated age Attitude/Demeanor/Rapport: Gracious Affect (typically observed): Pleasant Orientation: : Oriented to Self, Oriented to Place, Oriented to  Time, Oriented to Situation   Psych Involvement: No (comment)  Admission diagnosis:  Acute GI bleeding [K92.2] Acute UTI [N39.0] ABLA (acute blood loss anemia) [D62] Patient Active Problem List   Diagnosis Date Noted   ABLA (acute blood loss anemia) 04/12/2023   History of bleeding peptic ulcer 04/12/2023   History of pulmonary embolism, 12/28/22 04/12/2023   Chronic anticoagulation 04/12/2023   Acute GI bleeding 04/12/2023   Acute UTI 04/12/2023   Pulmonary embolism (HCC) 12/28/2022   Diarrhea 12/28/2022   Melena, with acute blood loss  anemia 07/08/2022   Dementia without behavioral disturbance (HCC) 10/03/2017   Gait abnormality 06/29/2016   Chronic bilateral low back pain without sciatica 06/29/2016   Memory loss Oct 31, 2015   Death of husband 04-27-2014   Bereavement due to life event 04-27-14   Absolute anemia 04/08/2014    Atrial flutter (HCC) 02/23/2014   Acute calculous cholecystitis 02/21/2014   Spinal stenosis of lumbar region 01/04/2014   Recent bereavement 10/25/2013   Arthritis 10/25/2013   Hyperlipidemia 10/23/2013   Hereditary and idiopathic peripheral neuropathy    Nausea alone 07/14/2012   Neuropathy, peripheral 05/05/2011   High risk medication use 05/05/2011   Subcutaneous emphysema (HCC) 05/05/2011   Cough 04/03/2011   Neuropathy 04/03/2011   Osteoarthritis 05/15/2010   STIFFNESS OF JOINT NEC ANKLE AND FOOT 05/15/2010   GANGLION CYST, WRIST, RIGHT 05/15/2010   VERTIGO, BENIGN PAROXYSMAL POSITION 01/31/2007   PCP:  Madelin Headings, MD Pharmacy:   [facility] [phone] - [place], [place] - [address] AT [facility] [address] [place] [address] Phone: [phone] Fax: [phone]  [facility] [address] [place] [address] Phone: [phone] Fax: [phone]     Social Drivers of Health (SDOH) Social History: SDOH Screenings   Food Insecurity: No Food Insecurity (Apr 28, 2023)  Housing: Low Risk  (2023/04/28)  Transportation Needs: No Transportation Needs (April 28, 2023)  Utilities: Not At Risk (2023-04-28)  Alcohol Screen: Low Risk  (06/04/2022)  Depression (PHQ2-9): Low Risk  (06/04/2022)  Financial Resource Strain: Low Risk  (06/04/2022)  Physical Activity: Inactive (06/04/2022)  Social Connections: Moderately Integrated (06/04/2022)  Stress: No Stress Concern Present (06/04/2022)  Tobacco Use: Medium Risk (04/13/2023)   SDOH Interventions:     Readmission Risk Interventions     No data to display

## 2023-04-14 NOTE — Anesthesia Postprocedure Evaluation (Signed)
Anesthesia Post Note  Patient: MCKENNAH STANFORTH  Procedure(s) Performed: ESOPHAGOGASTRODUODENOSCOPY (EGD) WITH PROPOFOL BIOPSY     Patient location during evaluation: Endoscopy Anesthesia Type: MAC Level of consciousness: awake and alert Pain management: pain level controlled Vital Signs Assessment: post-procedure vital signs reviewed and stable Respiratory status: spontaneous breathing, nonlabored ventilation, respiratory function stable and patient connected to nasal cannula oxygen Cardiovascular status: stable and blood pressure returned to baseline Postop Assessment: no apparent nausea or vomiting Anesthetic complications: no   No notable events documented.  Last Vitals:  Vitals:   04/13/23 2121 04/14/23 0622  BP: 120/61 (!) 145/78  Pulse: 77 71  Resp: 17 19  Temp: 37.2 C 36.8 C  SpO2: 97% 98%    Last Pain:  Vitals:   04/14/23 0753  TempSrc:   PainSc: 0-No pain                 Chasitty Hehl S

## 2023-04-14 NOTE — Plan of Care (Signed)

## 2023-04-14 NOTE — Progress Notes (Signed)
PT Son Donnald Garre updated about PT treatment. Inquires addressed with provider already. PT expected to discharge tomorrow.

## 2023-04-14 NOTE — Progress Notes (Signed)
[facility]  PROGRESS NOTE  Susan Ford ZOX:[id number] DOB: [birth year] DOA: 04/11/2023 PCP: Madelin Headings, MD   Brief HPI:   89 y.o. female with medical history significant for Alzheimer dementia, peripheral neuropathy, PUD, hospitalized in September with an acute PE, currently on Eliquis for 3 to 6 months who is being admitted with acute blood loss anemia due to suspected GI bleed.  Patient was seen in the ED the day prior on 12/15 for nausea and vomiting and had an unremarkable workup, was hydrated and discharged back to ALF.On 12/16 she she was noted to be incontinent of dark colored diarrhea and was sent back to the ED for reevaluation.    Of note, patient was hospitalized in March 2024 with melena and had EGD that showed a large nonbleeding prepyloric ulcer with gastritis. Daily Celebrex was held at the time.     Assessment/Plan:    Melena, with acute blood loss anemia -Underwent EGD which showed nonbleeding gastric ulcers with no stigmata of bleeding.  Biopsied. -Gastroenterology recommends Protonix 40 mg daily indefinitely. -Discontinue Eliquis if possible otherwise we can start after 1 week. -Telephone GI clinic for pathology results in 1 week  Dementia without behavioral disturbance (HCC) Delirium precautions  History of pulmonary embolism -Patient was diagnosed with pulmonary embolism in September, 01/01/2023 -Has been on Eliquis for 3 months -Will discontinue Eliquis as she seems to have completed treatment of 3 months and now has significant GI bleed as above   Neuropathy, peripheral Continue gabapentin Avoid NSAIDs   Hypokalemia -Potassium is 3.1 -Replace potassium and follow BMP in am   Medications     sodium chloride   Intravenous Once   gabapentin  300 mg Oral QID   pantoprazole (PROTONIX) IV  40 mg Intravenous Q12H   simvastatin  10 mg Oral q1800     Data Reviewed:   CBG:  No results for input(s): "GLUCAP" in the last 168  hours.  SpO2: 98 %    Vitals:   04/13/23 1140 04/13/23 1336 04/13/23 2121 04/14/23 0622  BP: (!) 139/55 138/68 120/61 (!) 145/78  Pulse: 65 72 77 71  Resp: 18 18 17 19   Temp:  98.3 F (36.8 C) 98.9 F (37.2 C) 98.3 F (36.8 C)  TempSrc:  Oral Oral Oral  SpO2: 97% 99% 97% 98%  Weight:      Height:          Data Reviewed:  Basic Metabolic Panel: Recent Labs  Lab 04/10/23 0923 04/11/23 2112 04/13/23 0559 04/14/23 0633  NA 133* 138 138 139  K 4.9 3.7 3.7 3.1*  CL 99 109 110 109  CO2 26 23 24 23   GLUCOSE 133* 125* 106* 97  BUN 35* 33* 22 20  CREATININE 0.71 0.51 0.65 0.58  CALCIUM 9.3 8.3* 8.1* 8.1*    CBC: Recent Labs  Lab 04/10/23 0923 04/11/23 2112 04/12/23 0642 04/12/23 1600 04/12/23 1724 04/14/23 0633  WBC 11.2* 7.8 7.0 6.2 6.2 6.2  NEUTROABS 9.6* 5.5  --   --   --   --   HGB 11.8* 9.1* 8.0* 9.5* 10.2* 9.7*  HCT 37.8 27.1* 23.7* 28.8* 32.3* 30.2*  MCV 96.9 95.1 96.3 94.4 101.9* 96.2  PLT 265 197 180 174 156 173    LFT Recent Labs  Lab 04/10/23 0923 04/11/23 2112  AST 24 22  ALT 16 13  ALKPHOS 52 39  BILITOT 1.2* 0.7  PROT 6.2* 5.7*  ALBUMIN 3.2* 3.1*     Antibiotics: Anti-infectives (  From admission, onward)    Start     Dose/Rate Route Frequency Ordered Stop   04/12/23 0015  cefTRIAXone (ROCEPHIN) 1 g in sodium chloride 0.9 % 100 mL IVPB        1 g 200 mL/hr over 30 Minutes Intravenous  Once 04/12/23 0014 04/12/23 0211        DVT prophylaxis: SCDs  Code Status: Full code  Family Communication: No family at bedside   CONSULTS    Subjective   Denies any complaints.  Objective    Physical Examination:  General-appears in no acute distress Heart-S1-S2, regular, no murmur auscultated Lungs-clear to auscultation bilaterally, no wheezing or crackles auscultated Abdomen-soft, nontender, no organomegaly Extremities-no edema in the lower extremities Neuro-alert, oriented x3, no focal deficit noted   Status is:  Inpatient:             Meredeth Ide   [facility] If 7PM-7AM, please contact night-coverage at [email or web address], Office  [phone]   04/14/2023, 9:17 AM  LOS: 2 days

## 2023-04-15 ENCOUNTER — Encounter (HOSPITAL_COMMUNITY): Payer: Self-pay | Admitting: Gastroenterology

## 2023-04-15 DIAGNOSIS — K922 Gastrointestinal hemorrhage, unspecified: Secondary | ICD-10-CM | POA: Diagnosis not present

## 2023-04-15 DIAGNOSIS — Z8711 Personal history of peptic ulcer disease: Secondary | ICD-10-CM | POA: Diagnosis not present

## 2023-04-15 DIAGNOSIS — K921 Melena: Secondary | ICD-10-CM | POA: Diagnosis not present

## 2023-04-15 DIAGNOSIS — D62 Acute posthemorrhagic anemia: Secondary | ICD-10-CM | POA: Diagnosis not present

## 2023-04-15 LAB — CBC
HCT: 25.8 % — ABNORMAL LOW (ref 36.0–46.0)
Hemoglobin: 8.7 g/dL — ABNORMAL LOW (ref 12.0–15.0)
MCH: 31.5 pg (ref 26.0–34.0)
MCHC: 33.7 g/dL (ref 30.0–36.0)
MCV: 93.5 fL (ref 80.0–100.0)
Platelets: 162 10*3/uL (ref 150–400)
RBC: 2.76 MIL/uL — ABNORMAL LOW (ref 3.87–5.11)
RDW: 13.1 % (ref 11.5–15.5)
WBC: 6.2 10*3/uL (ref 4.0–10.5)
nRBC: 0 % (ref 0.0–0.2)

## 2023-04-15 LAB — BASIC METABOLIC PANEL
Anion gap: 6 (ref 5–15)
BUN: 16 mg/dL (ref 8–23)
CO2: 22 mmol/L (ref 22–32)
Calcium: 7.7 mg/dL — ABNORMAL LOW (ref 8.9–10.3)
Chloride: 106 mmol/L (ref 98–111)
Creatinine, Ser: 0.61 mg/dL (ref 0.44–1.00)
GFR, Estimated: >60 mL/min (ref >60)
Glucose, Bld: 92 mg/dL (ref 70–99)
Potassium: 3.9 mmol/L (ref 3.5–5.1)
Sodium: 134 mmol/L — ABNORMAL LOW (ref 135–145)

## 2023-04-15 MED ORDER — PANTOPRAZOLE SODIUM 40 MG PO TBEC: 40.0000 mg | DELAYED_RELEASE_TABLET | Freq: Every day | ORAL | 11 refills | Status: DC

## 2023-04-15 NOTE — TOC Transition Note (Addendum)
Transition of Care [facility]) - Discharge Note   Patient Details  Name: Susan Ford MRN: [id number] Date of Birth: 10-07-1929  [facility] CM/SW Contact:  Beckie Busing, RN Phone Number:[phone]  04/15/2023, 10:01 AM   Clinical Narrative:    Patient with discharge order to d/c back to [facility] independent living with home health services through [facility]. Nurse inquired about family being 1.5 hours away and wanting ambulance transport. Nurse has been made aware that patient does not meet criteria for ambulance transport. Patient to contact [facility] for transportation. No other TOC needs noted at this time. TOC will sign off.   1029 Per nurse [facility] can not provide transportation. CM has called [facility] and spoke with Zaquell who states that transportation is booked for the day and they only do transport Mon-Fri. Per Zaquell this comes from the transportation manager that patients family will need to provide transportation ,via personal vehicle ,taxi or uber. CM has updated son Donnald Garre who will be contacting [facility] and calling CM back with update.   1054 Per son [facility] Mining engineer will transport patient back to [facility]. Nurse will need to call with time patient is ready and pickup location. Nurse has been updated. No other needs noted at this time.    Final next level of care: Home w Home Health Services Barriers to Discharge: No Barriers Identified   Patient Goals and CMS Choice Patient states their goals for this hospitalization and ongoing recovery are:: Wants to go back to indepent living CMS Medicare.gov Compare Post Acute Care list provided to:: Patient Choice offered to / list presented to : Patient [place] ownership interest in [facility].provided to::  (n/a)    Discharge Placement                       Discharge Plan and Services Additional resources added to the After Visit Summary for    In-house Referral: NA Discharge Planning Services: CM Consult Post Acute Care Choice: Home Health          DME Arranged: N/A DME Agency: NA       HH Arranged: PT HH Agency: Other - See comment International aid/development worker) Date HH Agency Contacted: 04/14/23 Time HH Agency Contacted: 1338 Representative spoke with at [facility] Agency: Rene Kocher  Social Drivers of Health (SDOH) Interventions SDOH Screenings   Food Insecurity: No Food Insecurity (04/12/2023)  Housing: Low Risk  (04/12/2023)  Transportation Needs: No Transportation Needs (04/12/2023)  Utilities: Not At Risk (04/12/2023)  Alcohol Screen: Low Risk  (06/04/2022)  Depression (PHQ2-9): Low Risk  (06/04/2022)  Financial Resource Strain: Low Risk  (06/04/2022)  Physical Activity: Inactive (06/04/2022)  Social Connections: Moderately Integrated (06/04/2022)  Stress: No Stress Concern Present (06/04/2022)  Tobacco Use: Medium Risk (04/13/2023)     Readmission Risk Interventions     No data to display

## 2023-04-15 NOTE — Discharge Summary (Addendum)
Physician Discharge Summary   Patient: Susan Ford MRN: [id number] DOB: 1929/12/17  Admit date:     04/11/2023  Discharge date: 04/15/23  Discharge Physician: Meredeth Ide   PCP: Madelin Headings, MD   Recommendations at discharge:   Discharge home with home health PT  Discharge Diagnoses: Principal Problem:   ABLA (acute blood loss anemia) Active Problems:   Melena, with acute blood loss anemia   History of bleeding peptic ulcer   Neuropathy, peripheral   Dementia without behavioral disturbance (HCC)   History of pulmonary embolism, 12/28/22   Chronic anticoagulation   Acute GI bleeding   Acute UTI  Resolved Problems:   * No resolved hospital problems. *  Hospital Course:   89 y.o. female with medical history significant for Alzheimer dementia, peripheral neuropathy, PUD, hospitalized in September with an acute PE, currently on Eliquis for 3 to 6 months who is being admitted with acute blood loss anemia due to suspected GI bleed.  Patient was seen in the ED the day prior on 12/15 for nausea and vomiting and had an unremarkable workup, was hydrated and discharged back to ALF.On 12/16 she she was noted to be incontinent of dark colored diarrhea and was sent back to the ED for reevaluation.    Of note, patient was hospitalized in March 2024 with melena and had EGD that showed a large nonbleeding prepyloric ulcer with gastritis. Daily Celebrex was held at the time.   Assessment and Plan:   Melena, with acute blood loss anemia -Underwent EGD which showed nonbleeding gastric ulcers with no stigmata of bleeding.  Biopsied. -Gastroenterology recommends Protonix 40 mg daily indefinitely. -Discontinue Eliquis if possible otherwise we can start after 1 week. -Telephone GI clinic for pathology results in 1 week -Hemoglobin is 8.7 today   Dementia without behavioral disturbance (HCC) Delirium precautions   History of pulmonary embolism -Patient was diagnosed with pulmonary  embolism in September, 01/01/2023 -Has been on Eliquis for 3 months -Will discontinue Eliquis as she seems to have completed treatment of 3 months and now has significant GI bleed as above   Neuropathy, peripheral Continue gabapentin Avoid NSAIDs   Hypokalemia -Replete         Consultants: Gastroenterology Procedures performed: EGD Disposition: Home Diet recommendation:  Discharge Diet Orders (From admission, onward)     Start     Ordered   04/15/23 0000  Diet - low sodium heart healthy        04/15/23 0931           Regular diet DISCHARGE MEDICATION: Allergies as of 04/15/2023       Reactions   Keflex [cephalexin]    Dizziness   Lyrica [pregabalin] Other (See Comments)   "High Fever"   Oxytetracycline Other (See Comments)   "High Fever" caused by terramycin   Memantine Rash        Medication List     STOP taking these medications    apixaban 2.5 MG Tabs tablet Commonly known as: ELIQUIS       TAKE these medications    acetaminophen 325 MG tablet Commonly known as: TYLENOL Take 650 mg by mouth every 6 (six) hours as needed for moderate pain (pain score 4-6) or mild pain (pain score 1-3).   Allergy Relief 10 MG tablet Generic drug: loratadine Take 10 mg by mouth every evening.   estradiol 0.025 mg/24hr patch Commonly known as: CLIMARA - Dosed in mg/24 hr PLACE 1 PATCH (0.025 MG TOTAL) ONTO THE  SKIN ONCE A WEEK ON SATURDAY What changed:  how much to take how to take this when to take this additional instructions   gabapentin 300 MG capsule Commonly known as: NEURONTIN Take 1 capsule (300 mg total) by mouth 4 (four) times daily.   hydrocortisone cream 1 % Apply topically 2 (two) times daily as needed for itching.   multivitamin tablet Take 1 tablet by mouth daily after lunch.   ondansetron 4 MG disintegrating tablet Commonly known as: ZOFRAN-ODT Take 1 tablet (4 mg total) by mouth every 8 (eight) hours as needed.   pantoprazole 40  MG tablet Commonly known as: Protonix Take 1 tablet (40 mg total) by mouth daily.   polyethylene glycol 17 g packet Commonly known as: MIRALAX / GLYCOLAX Take 17 g by mouth daily. What changed:  when to take this reasons to take this   Prevagen 10 MG Caps Generic drug: Apoaequorin Take 10 mg by mouth daily.   senna 8.6 MG Tabs tablet Commonly known as: SENOKOT Take 1 tablet (8.6 mg total) by mouth daily. What changed:  how much to take when to take this   simvastatin 10 MG tablet Commonly known as: ZOCOR Take 1 tablet (10 mg total) by mouth daily.   vitamin C 1000 MG tablet Take 1,000 mg by mouth daily.        Follow-up Information     Vida Rigger, MD. Call.   Specialty: Gastroenterology Why: As needed, If symptoms worsen Call in one week for pathology result Contact information: [address] [place] [address] [phone]         Madelin Headings, MD Follow up in 2 week(s).   Specialties: Internal Medicine, Pediatrics Contact information: [address] [place] [address] [phone]                Discharge Exam: Ceasar Mons Weights   04/13/23 0844  Weight: 55.3 kg   General-appears in no acute distress Heart-S1-S2, regular, no murmur auscultated Lungs-clear to auscultation bilaterally, no wheezing or crackles auscultated Abdomen-soft, nontender, no organomegaly Extremities-no edema in the lower extremities Neuro-alert, oriented x3, no focal deficit noted  Condition at discharge: good  The results of significant diagnostics from this hospitalization (including imaging, microbiology, ancillary and laboratory) are listed below for reference.   Imaging Studies: CT ABDOMEN PELVIS W CONTRAST Result Date: 04/10/2023 CLINICAL DATA:  Acute abdominal pain, nausea and vomiting beginning last night. EXAM: CT ABDOMEN AND PELVIS WITH CONTRAST TECHNIQUE: Multidetector CT imaging of the abdomen and pelvis was performed using  the standard protocol following bolus administration of intravenous contrast. RADIATION DOSE REDUCTION: This exam was performed according to the departmental dose-optimization program which includes automated exposure control, adjustment of the mA and/or kV according to patient size and/or use of iterative reconstruction technique. CONTRAST:  OMNIPAQUE IOHEXOL 300 MG/ML  SOLN COMPARISON:  01/08/2023 FINDINGS: Lower Chest: No acute findings. Stable small fat-containing left posterior diaphragmatic hernia incidentally noted. Hepatobiliary: No suspicious hepatic masses identified. Mild diffuse hepatic steatosis again noted. Prior cholecystectomy. No evidence of biliary obstruction. Pancreas:  No mass or inflammatory changes. Spleen: Within normal limits in size and appearance. Adrenals/Urinary Tract: No suspicious masses identified. No evidence of ureteral calculi or hydronephrosis. Stomach/Bowel: Small hiatal hernia seen. No evidence of obstruction, inflammatory process or abnormal fluid collections. Diverticulosis is seen mainly involving the sigmoid colon, however there is no evidence of diverticulitis. A small left lower quadrant spigelian hernia is again seen containing small bowel, however,  there is no No evidence of bowel obstruction or strangulation. Vascular/Lymphatic: No pathologically enlarged lymph nodes. No acute vascular findings. Reproductive: Prior hysterectomy noted. Adnexal regions are unremarkable in appearance. Other:  None. Musculoskeletal: No suspicious bone lesions identified. Severe lumbar spine degenerative changes again noted. IMPRESSION: No acute findings. Colonic diverticulosis, without radiographic evidence of diverticulitis. Small hiatal hernia. 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 No evidence of bowel obstruction or strangulation. Mild hepatic steatosis. Electronically Signed   By: Danae Orleans M.D.   On: 04/10/2023 11:09    Microbiology: Results  for orders placed or performed during the hospital encounter of 04/11/23  Urine Culture     Status: Abnormal   Collection Time: 04/11/23 10:19 PM   Specimen: Urine, Clean Catch  Result Value Ref Range Status   Specimen Description   Final    URINE, CLEAN CATCH Performed at [facility], [address] [place], [place] [address]    Special Requests   Final    NONE Performed at [facility], [address], [place], [place] [address]    Culture >=100,000 COLONIES/mL KLEBSIELLA PNEUMONIAE (A)  Final   Report Status 04/14/2023 FINAL  Final   Organism ID, Bacteria KLEBSIELLA PNEUMONIAE (A)  Final      Susceptibility   Klebsiella pneumoniae - MIC*    AMPICILLIN RESISTANT Resistant     CEFAZOLIN <=4 SENSITIVE Sensitive     CEFEPIME <=0.12 SENSITIVE Sensitive     CEFTRIAXONE <=0.25 SENSITIVE Sensitive     CIPROFLOXACIN <=0.25 SENSITIVE Sensitive     GENTAMICIN <=1 SENSITIVE Sensitive     IMIPENEM 0.5 SENSITIVE Sensitive     NITROFURANTOIN <=16 SENSITIVE Sensitive     TRIMETH/SULFA <=20 SENSITIVE Sensitive     AMPICILLIN/SULBACTAM <=2 SENSITIVE Sensitive     PIP/TAZO <=4 SENSITIVE Sensitive ug/mL    * >=100,000 COLONIES/mL KLEBSIELLA PNEUMONIAE    Labs: CBC: Recent Labs  Lab 04/10/23 0923 04/11/23 2112 04/12/23 0642 04/12/23 1600 04/12/23 1724 04/14/23 0633 04/15/23 0531  WBC 11.2* 7.8 7.0 6.2 6.2 6.2 6.2  NEUTROABS 9.6* 5.5  --   --   --   --   --   HGB 11.8* 9.1* 8.0* 9.5* 10.2* 9.7* 8.7*  HCT 37.8 27.1* 23.7* 28.8* 32.3* 30.2* 25.8*  MCV 96.9 95.1 96.3 94.4 101.9* 96.2 93.5  PLT 265 197 180 174 156 173 162   Basic Metabolic Panel: Recent Labs  Lab 04/10/23 0923 04/11/23 2112 04/13/23 0559 04/14/23 0633 04/15/23 0531  NA 133* 138 138 139 134*  K 4.9 3.7 3.7 3.1* 3.9  CL 99 109 110 109 106  CO2 26 23 24 23 22   GLUCOSE 133* 125* 106* 97 92  BUN 35* 33* 22 20 16   CREATININE 0.71 0.51 0.65 0.58 0.61  CALCIUM 9.3 8.3* 8.1*  8.1* 7.7*   Liver Function Tests: Recent Labs  Lab 04/10/23 0923 04/11/23 2112  AST 24 22  ALT 16 13  ALKPHOS 52 39  BILITOT 1.2* 0.7  PROT 6.2* 5.7*  ALBUMIN 3.2* 3.1*   CBG: No results for input(s): "GLUCAP" in the last 168 hours.  Discharge time spent: greater than 30 minutes.  Signed: Meredeth Ide, MD [facility] 04/15/2023

## 2023-04-28 ENCOUNTER — Inpatient Hospital Stay: Payer: Medicare Other | Admitting: Internal Medicine

## 2023-05-11 ENCOUNTER — Other Ambulatory Visit: Payer: Self-pay | Admitting: Internal Medicine

## 2023-05-11 ENCOUNTER — Encounter: Payer: Self-pay | Admitting: Internal Medicine

## 2023-05-11 ENCOUNTER — Ambulatory Visit: Payer: Medicare Other

## 2023-05-11 ENCOUNTER — Ambulatory Visit (INDEPENDENT_AMBULATORY_CARE_PROVIDER_SITE_OTHER): Payer: Medicare Other | Admitting: Internal Medicine

## 2023-05-11 VITALS — BP 136/78 | HR 70 | Temp 97.7°F | Ht 64.0 in | Wt 124.8 lb

## 2023-05-11 DIAGNOSIS — R609 Edema, unspecified: Secondary | ICD-10-CM

## 2023-05-11 DIAGNOSIS — D500 Iron deficiency anemia secondary to blood loss (chronic): Secondary | ICD-10-CM | POA: Diagnosis not present

## 2023-05-11 DIAGNOSIS — R918 Other nonspecific abnormal finding of lung field: Secondary | ICD-10-CM

## 2023-05-11 DIAGNOSIS — R069 Unspecified abnormalities of breathing: Secondary | ICD-10-CM | POA: Diagnosis not present

## 2023-05-11 DIAGNOSIS — Z09 Encounter for follow-up examination after completed treatment for conditions other than malignant neoplasm: Secondary | ICD-10-CM | POA: Diagnosis not present

## 2023-05-11 NOTE — Progress Notes (Signed)
 Chief Complaint  Patient presents with   Hospitalization Follow-up   Edema    Pt reports swelling on both feet. Denied any pain.     HPI: Susan Ford 89 y.o. come in for [facility] after  GI bleed /gastric ulcer non bleeding noted whil on anticoagulation for  PE  Date 12 16- 12 20 24   Had endoscopy  ABLAnemia  no transfusion   Taking mvi  takes with food .  Denies sob new doe  but has some bilateral mild lower leg swelling .  Has been using estrogen patch for years   taking weekly low dose  ROS: See pertinent positives and negatives per HPI. No cough fall . Eating ok   Past Medical History:  Diagnosis Date   Arthritis    Cataract 2007   bilateral cat ext    Cholecystitis    Complication of anesthesia Feb 21, 2014   atrial fib after cholecystectomy, lasted a few hours, none since   Dysrhythmia 02-21-2014   after cholecystectomy had atrial fib for a few hours, none since   GERD (gastroesophageal reflux disease)    Had endoscopy no history of Barrett's   Hx of nonmelanoma skin cancer    Unspecified hereditary and idiopathic peripheral neuropathy     Family History  Problem Relation Age of Onset   Neurodegenerative disease Mother        Shy Drager died at 18   Alcohol  abuse Other    Heart disease Other     Social History   Socioeconomic History   Marital status: Widowed    Spouse name: Not on file   Number of children: 3   Years of education: 15 years   Highest education level: Associate degree: occupational, Scientist, product/process development, or vocational program  Occupational History   Occupation: Retired    Comment: Charity fundraiser  Tobacco Use   Smoking status: Former    Current packs/day: 0.00    Average packs/day: 0.3 packs/day for 7.0 years (1.8 ttl pk-yrs)    Types: Cigarettes    Start date: 04/26/1956    Quit date: 04/27/1963    Years since quitting: 60.0   Smokeless tobacco: Never  Substance and Sexual Activity   Alcohol  use: Yes    Alcohol /week: 7.0 standard drinks of  alcohol     Types: 7 Glasses of wine per week    Comment: 1 glass of wine daily   Drug use: No    Types: Hydrocodone    Sexual activity: Yes  Other Topics Concern   Not on file  Social History Narrative   Retired Charity fundraiser    widowed since    Spring  17    [facility]   Regular exercise former smoker   G3P3   From [place] originally      05/12/2018:   Lives alone on ranch house, but has stairs to garage with "acorn chair" lift    Son in [place], and two daughters out of state, all of whom are supportive    Worked in NICU, 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          Social Drivers of Corporate investment banker Strain: Low Risk  (06/04/2022)   Overall Financial Resource Strain (CARDIA)    Difficulty of Paying Living Expenses: Not hard at all  Food Insecurity: No Food Insecurity (04/12/2023)   Hunger Vital Sign    Worried About Running Out of Food in the Last  Year: Never true    Ran Out of Food in the Last Year: Never true  Transportation Needs: No Transportation Needs (04/12/2023)   PRAPARE - Administrator, Civil Service (Medical): No    Lack of Transportation (Non-Medical): No  Physical Activity: Inactive (06/04/2022)   Exercise Vital Sign    Days of Exercise per Week: 0 days    Minutes of Exercise per Session: 0 min  Stress: No Stress Concern Present (06/04/2022)   [employer] of Occupational Health - Occupational Stress Questionnaire    Feeling of Stress : Not at all  Social Connections: Moderately Integrated (06/04/2022)   Social Connection and Isolation Panel [NHANES]    Frequency of Communication with Friends and Family: More than three times a week    Frequency of Social Gatherings with Friends and Family: More than three times a week    Attends Religious Services: More than 4 times per year    Active Member of [employer] or Organizations: Yes    Attends Banker Meetings: More than 4 times per year     Marital Status: Widowed    Outpatient Medications Prior to Visit  Medication Sig Dispense Refill   acetaminophen  (TYLENOL ) 325 MG tablet Take 650 mg by mouth every 6 (six) hours as needed for moderate pain (pain score 4-6) or mild pain (pain score 1-3).     Apoaequorin (PREVAGEN) 10 MG CAPS Take 10 mg by mouth daily.     Ascorbic Acid  (VITAMIN C ) 1000 MG tablet Take 1,000 mg by mouth daily.     gabapentin  (NEURONTIN ) 300 MG capsule Take 1 capsule (300 mg total) by mouth 4 (four) times daily. 120 capsule 3   Multiple Vitamin (MULTIVITAMIN) tablet Take 1 tablet by mouth daily after lunch.     polyethylene glycol (MIRALAX  / GLYCOLAX ) 17 g packet Take 17 g by mouth daily. (Patient taking differently: Take 17 g by mouth as needed for moderate constipation or mild constipation.) 14 each 0   senna (SENOKOT) 8.6 MG TABS tablet Take 1 tablet (8.6 mg total) by mouth daily. (Patient taking differently: Take 2 tablets by mouth every other day.) 120 tablet 0   simvastatin  (ZOCOR ) 10 MG tablet Take 1 tablet (10 mg total) by mouth daily. 90 tablet 1   estradiol  (CLIMARA  - DOSED IN MG/24 HR) 0.025 mg/24hr patch PLACE 1 PATCH (0.025 MG TOTAL) ONTO THE SKIN ONCE A WEEK ON SATURDAY (Patient taking differently: Place 0.025 mg onto the skin once a week. Sundays) 12 patch 1   hydrocortisone  cream 1 % Apply topically 2 (two) times daily as needed for itching. (Patient not taking: Reported on 04/10/2023) 28 g 0   loratadine (ALLERGY RELIEF) 10 MG tablet Take 10 mg by mouth every evening. (Patient not taking: Reported on 04/10/2023)     ondansetron  (ZOFRAN -ODT) 4 MG disintegrating tablet Take 1 tablet (4 mg total) by mouth every 8 (eight) hours as needed. (Patient not taking: Reported on 04/12/2023) 20 tablet 0   pantoprazole  (PROTONIX ) 40 MG tablet Take 1 tablet (40 mg total) by mouth daily. (Patient not taking: Reported on 05/11/2023) 30 tablet 11   No facility-administered medications prior to visit.     EXAM:  BP  136/78   Pulse 70   Temp 97.7 F (36.5 C) (Oral)   Ht 5\' 4"  (1.626 m)   Wt 124 lb 12.8 oz (56.6 kg)   SpO2 97%   BMI 21.42 kg/m   Body mass index is 21.42 kg/m.  GENERAL: vitals reviewed and listed above, alert, oriented, appears well hydrated and in no acute distress ambulatory with roller walker  some hard of hearing  well groomed  HEENT: atraumatic, conjunctiva  clear, no obvious abnormalities on inspection of external nose and ears O NECK: no obvious masses on inspection palpation  LUNGS: clear to auscultation bilaterally, no wheezes, bibalsilar fine dry crackles  otherwise , good air movement CV: HRRR, no clubbing cyanosis puffy  dependent edema 1+ nl cap refill  MS: moves all extremities without noticeable focal  abnormality kyphosis  PSYCH: pleasant and cooperative, memory not tested today  nl conversation except hearing  interfere Lab Results  Component Value Date   WBC 6.2 04/15/2023   HGB 8.7 (L) 04/15/2023   HCT 25.8 (L) 04/15/2023   PLT 162 04/15/2023   GLUCOSE 92 04/15/2023   CHOL 150 05/05/2021   TRIG 191.0 (H) 05/05/2021   HDL 64.30 05/05/2021   LDLDIRECT 153 02/12/2009   LDLCALC 48 05/05/2021   ALT 13 04/11/2023   AST 22 04/11/2023   NA 134 (L) 04/15/2023   K 3.9 04/15/2023   CL 106 04/15/2023   CREATININE 0.61 04/15/2023   BUN 16 04/15/2023   CO2 22 04/15/2023   TSH 0.70 08/25/2022   INR 1.0 04/11/2023   HGBA1C 5.5 05/05/2021   BP Readings from Last 3 Encounters:  05/11/23 136/78  04/15/23 117/61  04/10/23 (!) 142/74    ASSESSMENT AND PLAN:  Discussed the following assessment and plan:  Blood loss anemia - Plan: CBC with Differential/Platelet, Brain Natriuretic Peptide, Basic metabolic panel, Hepatic function panel, TSH  Hospital discharge follow-up - Plan: CBC with Differential/Platelet, Brain Natriuretic Peptide, Basic metabolic panel, Hepatic function panel, TSH  Swelling - Plan: CBC with Differential/Platelet, Brain Natriuretic Peptide,  Basic metabolic panel, Hepatic function panel, TSH, DG Chest 2 View  Lung field abnormal finding on examination - Plan: CBC with Differential/Platelet, Brain Natriuretic Peptide, Basic metabolic panel, Hepatic function panel, TSH, DG Chest 2 View Looks well today  has fine dry crackles at bases but  good o2 sat . No c/o of  dyspnea .  New mild edema    Check labs and baseline c xray ( last ct  showed interstitial markings noted)   Will stop the estrogen patch despite low dose because of hx of PE and risk of anticoagulation is  too high since bleed . Plan fu in April ( change appt) or as needed.  -Patient advised to return or notify health care team  if  new concerns arise.  Patient Instructions  Goo d to see you today  Update lab to make sure anemia is getting better. Stay off  blood thinner  Also lets try off the estrogen low dose patch  and see how you do  as it could make a tendency to clot even at a low dose .  You have some fine crackles in your lungs that appear to be old based on the past imaging studies . Will follow  up c xray to get baseline  .     Joleena Weisenburger K. Lynx Goodrich M.D.    See hosp dc below  Expand All Collapse All      Physician Discharge Summary    Patient: LETASHA CASTELO MRN: [id number] DOB: 1929-12-30  Admit date:     04/11/2023  Discharge date: 04/15/23  Discharge Physician: Ozell Blunt    PCP: Reginal Capra, MD    Recommendations at discharge:    Discharge  home with home health PT   Discharge Diagnoses: Principal Problem:   ABLA (acute blood loss anemia) Active Problems:   Melena, with acute blood loss anemia   History of bleeding peptic ulcer   Neuropathy, peripheral   Dementia without behavioral disturbance (HCC)   History of pulmonary embolism, 12/28/22   Chronic anticoagulation   Acute GI bleeding   Acute UTI   Resolved Problems:   * No resolved hospital problems. [facility]:     89 y.o. female with medical history  significant for Alzheimer dementia, peripheral neuropathy, PUD, hospitalized in September with an acute PE, currently on Eliquis  for 3 to 6 months who is being admitted with acute blood loss anemia due to suspected GI bleed.  Patient was seen in the ED the day prior on 12/15 for nausea and vomiting and had an unremarkable workup, was hydrated and discharged back to ALF.On 12/16 she she was noted to be incontinent of dark colored diarrhea and was sent back to the ED for reevaluation.    Of note, patient was hospitalized in March 2024 with melena and had EGD that showed a large nonbleeding prepyloric ulcer with gastritis. Daily Celebrex  was held at the time.    Assessment and Plan:     Melena, with acute blood loss anemia -Underwent EGD which showed nonbleeding gastric ulcers with no stigmata of bleeding.  Biopsied. -Gastroenterology recommends Protonix  40 mg daily indefinitely. -Discontinue Eliquis  if possible otherwise we can start after 1 week. -Telephone GI clinic for pathology results in 1 week -Hemoglobin is 8.7 today   Dementia without behavioral disturbance (HCC) Delirium precautions   History of pulmonary embolism -Patient was diagnosed with pulmonary embolism in September, 01/01/2023 -Has been on Eliquis  for 3 months -Will discontinue Eliquis  as she seems to have completed treatment of 3 months and now has significant GI bleed as above   Neuropathy, peripheral Continue gabapentin  Avoid NSAIDs   Hypokalemia -Replete

## 2023-05-11 NOTE — Patient Instructions (Signed)
 Goo d to see you today  Update lab to make sure anemia is getting better. Stay off  blood thinner  Also lets try off the estrogen low dose patch  and see how you do  as it could make a tendency to clot even at a low dose .  You have some fine crackles in your lungs that appear to be old based on the past imaging studies . Will follow  up c xray to get baseline  .

## 2023-05-12 LAB — BASIC METABOLIC PANEL
BUN: 11 mg/dL (ref 6–23)
CO2: 28 meq/L (ref 19–32)
Calcium: 8.8 mg/dL (ref 8.4–10.5)
Chloride: 102 meq/L (ref 96–112)
Creatinine, Ser: 0.68 mg/dL (ref 0.40–1.20)
GFR: 75.9 mL/min (ref >60.00)
Glucose, Bld: 93 mg/dL (ref 70–99)
Potassium: 4.3 meq/L (ref 3.5–5.1)
Sodium: 136 meq/L (ref 135–145)

## 2023-05-12 LAB — CBC WITH DIFFERENTIAL/PLATELET
Basophils Absolute: 0.1 10*3/uL (ref 0.0–0.1)
Basophils Relative: 1.3 % (ref 0.0–3.0)
Eosinophils Absolute: 0.6 10*3/uL (ref 0.0–0.7)
Eosinophils Relative: 7.4 % — ABNORMAL HIGH (ref 0.0–5.0)
HCT: 35.3 % — ABNORMAL LOW (ref 36.0–46.0)
Hemoglobin: 11.8 g/dL — ABNORMAL LOW (ref 12.0–15.0)
Lymphocytes Relative: 19.6 % (ref 12.0–46.0)
Lymphs Abs: 1.6 10*3/uL (ref 0.7–4.0)
MCHC: 33.5 g/dL (ref 30.0–36.0)
MCV: 94.9 fL (ref 78.0–100.0)
Monocytes Absolute: 0.6 10*3/uL (ref 0.1–1.0)
Monocytes Relative: 7.7 % (ref 3.0–12.0)
Neutro Abs: 5.1 10*3/uL (ref 1.4–7.7)
Neutrophils Relative %: 64 % (ref 43.0–77.0)
Platelets: 242 10*3/uL (ref 150.0–400.0)
RBC: 3.72 Mil/uL — ABNORMAL LOW (ref 3.87–5.11)
RDW: 14.1 % (ref 11.5–15.5)
WBC: 8 10*3/uL (ref 4.0–10.5)

## 2023-05-12 LAB — HEPATIC FUNCTION PANEL
ALT: 12 U/L (ref 0–35)
AST: 21 U/L (ref 0–37)
Albumin: 3.9 g/dL (ref 3.5–5.2)
Alkaline Phosphatase: 72 U/L (ref 39–117)
Bilirubin, Direct: 0.1 mg/dL (ref 0.0–0.3)
Total Bilirubin: 0.5 mg/dL (ref 0.2–1.2)
Total Protein: 6.1 g/dL (ref 6.0–8.3)

## 2023-05-12 LAB — BRAIN NATRIURETIC PEPTIDE: Pro B Natriuretic peptide (BNP): 61 pg/mL (ref 0.0–100.0)

## 2023-05-13 LAB — TSH: TSH: 1.68 u[IU]/mL (ref 0.35–5.50)

## 2023-05-14 ENCOUNTER — Encounter: Payer: Self-pay | Admitting: Internal Medicine

## 2023-05-14 NOTE — Progress Notes (Signed)
Anemia is improving    , other blood work is normal   waiting on chest x ray  We can fu at your next visit

## 2023-05-27 ENCOUNTER — Encounter: Payer: Self-pay | Admitting: Internal Medicine

## 2023-05-27 NOTE — Progress Notes (Signed)
Chest  lung X ray stable    old changes    no evidence of fluid or infection in chest  And if feeling ok  then no action  until we see you today  Can review again at next visit

## 2023-06-01 ENCOUNTER — Ambulatory Visit: Payer: Self-pay | Admitting: Internal Medicine

## 2023-06-01 NOTE — Telephone Encounter (Signed)
 Chief Complaint: Medication Question Symptoms: Leg and foot swelling  Frequency: When taking Pantoprazole  Pertinent Negatives: Patient denies current symptoms Disposition: [] ED /[] Urgent Care (no appt availability in office) / [] Appointment(In office/virtual)/ []  [facility]/ [] Home Care/ [] Refused Recommended Disposition /[] [facility]/ [x]  Follow-up with PCP Additional Notes: Patient states she notice foot and leg swelling when she was taking Pantoprazole , patient report the leg and foot swelling has been gone since she stopped taking the Pantoprazole . Patient was prescribed Pantoprazole  in the hospital after an acute GI bleed.  Patient reported she was not taking the Pantoprazole  at her hospital follow-up appt with PCP on 05/11/23. Care advice was given. Patient stated she just wants to know since she is no longer taking the medication should be taking something else in its' place. Will route to PCP for recommendations.  Copied from CRM [id number]. Topic: Clinical - Medication Question >> Jun 01, 2023  9:52 AM Mercedes MATSU wrote: Reason for CRM: Patient called in stating that she can not take her pantoprazole  (PROTONIX ) 40 MG tablet medication. She states that it is causing her limbs to swell. She wants to know if there is an alternative medication she can take. Dr. Charlett is not the provider who prescribed the medication, but the patient was hoping she can assist with this. Patient is requesting a call back and can be reached at [phone]. Reason for Disposition  [1] Caller has medicine question about med NOT prescribed by PCP AND [2] triager unable to answer question (e.g., compatibility with other med, storage)  Answer Assessment - Initial Assessment Questions 1. NAME of MEDICINE: What medicine(s) are you calling about?     Pantoprazole  40 MG  2. QUESTION: What is your question? (e.g., double dose of medicine, side effect)     What can I take instead on Pantoprazole ?   3. PRESCRIBER: Who prescribed the medicine? Reason: if prescribed by specialist, call should be referred to that group.     Dr.Lama  4. SYMPTOMS: Do you have any symptoms? If Yes, ask: What symptoms are you having?  How bad are the symptoms (e.g., mild, moderate, severe)     Feet and leg swelling  Protocols used: Medication Question Call-A-AH

## 2023-06-06 DIAGNOSIS — K080 Exfoliation of teeth due to systemic causes: Secondary | ICD-10-CM | POA: Diagnosis not present

## 2023-06-07 NOTE — Telephone Encounter (Signed)
Attempted to reach pt twice.  Unable to reach. Will try again.

## 2023-06-13 NOTE — Telephone Encounter (Signed)
Attempted to reach pt. Phone went busy.   Reach out to pt's son on DPR.   Relay message to him. He states he will see his mom tomorrow and will update to pt. To have pt to call us back.

## 2023-06-15 ENCOUNTER — Ambulatory Visit (INDEPENDENT_AMBULATORY_CARE_PROVIDER_SITE_OTHER): Payer: Medicare Other

## 2023-06-15 VITALS — Ht 64.0 in | Wt 124.0 lb

## 2023-06-15 DIAGNOSIS — Z0000 Encounter for general adult medical examination without abnormal findings: Secondary | ICD-10-CM | POA: Diagnosis not present

## 2023-06-15 NOTE — Progress Notes (Signed)
Subjective:   Susan Ford is a 89 y.o. female who presents for Medicare Annual (Subsequent) preventive examination.  Visit Complete: Virtual I connected with  Avie Echevaria on 06/15/23 by a audio enabled telemedicine application and verified that I am speaking with the correct person using two identifiers.  Patient Location: Home  Provider Location: Home Office  I discussed the limitations of evaluation and management by telemedicine. The patient expressed understanding and agreed to proceed.  Vital Signs: Because this visit was a virtual/telehealth visit, some criteria may be missing or patient reported. Any vitals not documented were not able to be obtained and vitals that have been documented are patient reported.   Cardiac Risk Factors include: advanced age (>66men, >37 women)     Objective:    Today's Vitals   06/15/23 1116  Weight: 124 lb (56.2 kg)  Height: 5\' 4"  (1.626 m)   Body mass index is 21.28 kg/m.     06/15/2023   11:25 AM 04/12/2023    4:42 PM 04/11/2023    8:39 PM 04/10/2023    9:33 AM 01/08/2023   10:28 AM 12/28/2022    6:28 PM 07/08/2022   11:22 AM  Advanced Directives  Does Patient Have a Medical Advance Directive? Yes Yes Yes Yes No Yes No  Type of Estate agent of [place];Living will Healthcare Power of [place];Living will Living will;Healthcare Power of Attorney Living will;Healthcare Power of Attorney  Living will   Does patient want to make changes to medical advance directive?  No - Patient declined  No - Patient declined  No - Patient declined   Copy of Healthcare Power of Attorney in Chart? No - copy requested No - copy requested       Would patient like information on creating a medical advance directive?     No - Patient declined  No - Patient declined    Current Medications (verified) Outpatient Encounter Medications as of 06/15/2023  Medication Sig   acetaminophen (TYLENOL) 325 MG tablet Take 650 mg by  mouth every 6 (six) hours as needed for moderate pain (pain score 4-6) or mild pain (pain score 1-3).   Apoaequorin (PREVAGEN) 10 MG CAPS Take 10 mg by mouth daily.   Ascorbic Acid (VITAMIN C) 1000 MG tablet Take 1,000 mg by mouth daily.   gabapentin (NEURONTIN) 300 MG capsule Take 1 capsule (300 mg total) by mouth 4 (four) times daily.   hydrocortisone cream 1 % Apply topically 2 (two) times daily as needed for itching. (Patient not taking: Reported on 04/10/2023)   loratadine (ALLERGY RELIEF) 10 MG tablet Take 10 mg by mouth every evening. (Patient not taking: Reported on 04/10/2023)   Multiple Vitamin (MULTIVITAMIN) tablet Take 1 tablet by mouth daily after lunch.   ondansetron (ZOFRAN-ODT) 4 MG disintegrating tablet Take 1 tablet (4 mg total) by mouth every 8 (eight) hours as needed. (Patient not taking: Reported on 04/12/2023)   pantoprazole (PROTONIX) 40 MG tablet Take 1 tablet (40 mg total) by mouth daily. (Patient not taking: Reported on 05/11/2023)   polyethylene glycol (MIRALAX / GLYCOLAX) 17 g packet Take 17 g by mouth daily. (Patient taking differently: Take 17 g by mouth as needed for moderate constipation or mild constipation.)   senna (SENOKOT) 8.6 MG TABS tablet Take 1 tablet (8.6 mg total) by mouth daily. (Patient taking differently: Take 2 tablets by mouth every other day.)   simvastatin (ZOCOR) 10 MG tablet Take 1 tablet (10 mg total) by mouth  daily.   No facility-administered encounter medications on file as of 06/15/2023.    Allergies (verified) Keflex [cephalexin], Lyrica [pregabalin], Oxytetracycline, and Memantine   History: Past Medical History:  Diagnosis Date   Arthritis    Cataract 2007   bilateral cat ext    Cholecystitis    Complication of anesthesia Feb 21, 2014   atrial fib after cholecystectomy, lasted a few hours, none since   Dysrhythmia 02-21-2014   after cholecystectomy had atrial fib for a few hours, none since   GERD (gastroesophageal reflux disease)     Had endoscopy no history of Barrett's   Hx of nonmelanoma skin cancer    Unspecified hereditary and idiopathic peripheral neuropathy    Past Surgical History:  Procedure Laterality Date   ABDOMINAL HYSTERECTOMY  15 yrs ago   complete   BIOPSY  07/09/2022   Procedure: BIOPSY;  Surgeon: Kathi Der, MD;  Location: [facility];  Service: Gastroenterology;;   BIOPSY  04/13/2023   Procedure: BIOPSY;  Surgeon: Vida Rigger, MD;  Location: [facility];  Service: Gastroenterology;;   BREAST CYST ASPIRATION     CHOLECYSTECTOMY N/A 02/21/2014   Procedure: LAPAROSCOPIC CHOLECYSTECTOMY WITH INTRAOPERATIVE CHOLANGIOGRAM;  Surgeon: Frederik Schmidt, MD;  Location: [facility] OR;  Service: General;  Laterality: N/A;   ENDOSCOPIC RETROGRADE CHOLANGIOPANCREATOGRAPHY (ERCP) WITH PROPOFOL N/A 05/29/2014   Procedure: ENDOSCOPIC RETROGRADE CHOLANGIOPANCREATOGRAPHY (ERCP) WITH PROPOFOL;  Surgeon: Willis Modena, MD;  Location: [facility];  Service: Endoscopy;  Laterality: N/A;   ERCP N/A 02/24/2014   Procedure: ENDOSCOPIC RETROGRADE CHOLANGIOPANCREATOGRAPHY (ERCP);  Surgeon: Willis Modena, MD;  Location: [facility];  Service: Endoscopy;  Laterality: N/A;   ESOPHAGOGASTRODUODENOSCOPY (EGD) WITH PROPOFOL N/A 07/09/2022   Procedure: ESOPHAGOGASTRODUODENOSCOPY (EGD) WITH PROPOFOL;  Surgeon: Kathi Der, MD;  Location: [facility];  Service: Gastroenterology;  Laterality: N/A;   ESOPHAGOGASTRODUODENOSCOPY (EGD) WITH PROPOFOL N/A 04/13/2023   Procedure: ESOPHAGOGASTRODUODENOSCOPY (EGD) WITH PROPOFOL;  Surgeon: Vida Rigger, MD;  Location: [facility];  Service: Gastroenterology;  Laterality: N/A;   EYE SURGERY Bilateral 2007   both eyes cataracts with lens replacments   LUMBAR LAMINECTOMY/DECOMPRESSION MICRODISCECTOMY  02/01/2012   Procedure: LUMBAR LAMINECTOMY/DECOMPRESSION MICRODISCECTOMY 2 LEVELS;  Surgeon: Barnett Abu, MD;  Location: [facility]O [facility];  Service: Neurosurgery;  Laterality: Left;  Left Lumbar  Four-Five Lumbar Five-Sacral One Laminectomies/Foraminotomies/Microscope   SPYGLASS CHOLANGIOSCOPY N/A 05/29/2014   Procedure: SPYGLASS CHOLANGIOSCOPY;  Surgeon: Willis Modena, MD;  Location: [facility];  Service: Endoscopy;  Laterality: N/A;   TONSILLECTOMY  age 40   Family History  Problem Relation Age of Onset   Neurodegenerative disease Mother        Shy Drager died at 47   Alcohol abuse Other    Heart disease Other    Social History   Socioeconomic History   Marital status: Widowed    Spouse name: Not on file   Number of children: 3   Years of education: 15 years   Highest education level: Associate degree: occupational, Scientist, product/process development, or vocational program  Occupational History   Occupation: Retired    Comment: Charity fundraiser  Tobacco Use   Smoking status: Former    Current packs/day: 0.00    Average packs/day: 0.3 packs/day for 7.0 years (1.8 ttl pk-yrs)    Types: Cigarettes    Start date: 04/26/1956    Quit date: 04/27/1963    Years since quitting: 60.1   Smokeless tobacco: Never  Substance and Sexual Activity   Alcohol use: Yes    Alcohol/week: 7.0 standard drinks of alcohol  Types: 7 Glasses of wine per week    Comment: 1 glass of wine daily   Drug use: No    Types: Hydrocodone   Sexual activity: Yes  Other Topics Concern   Not on file  Social History Narrative   Retired Charity fundraiser    widowed since    Spring  17    [employer] [place] of 1   Regular exercise former smoker   G3P3   From [place] originally      05/12/2018:   Lives alone on ranch house, but has stairs to garage with "acorn chair" lift    Son in [place], and two daughters out of state, all of whom are supportive    Worked in NICU, 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          Social Drivers of Corporate investment banker Strain: Low Risk  (06/15/2023)   Overall Financial Resource Strain (CARDIA)    Difficulty of Paying Living Expenses: Not hard at all  Food Insecurity: No  Food Insecurity (06/15/2023)   Hunger Vital Sign    Worried About Running Out of Food in the Last Year: Never true    Ran Out of Food in the Last Year: Never true  Transportation Needs: No Transportation Needs (06/15/2023)   PRAPARE - Administrator, Civil Service (Medical): No    Lack of Transportation (Non-Medical): No  Physical Activity: Inactive (06/15/2023)   Exercise Vital Sign    Days of Exercise per Week: 0 days    Minutes of Exercise per Session: 0 min  Stress: No Stress Concern Present (06/15/2023)   [employer] of Occupational Health - Occupational Stress Questionnaire    Feeling of Stress : Not at all  Social Connections: Moderately Integrated (06/15/2023)   Social Connection and Isolation Panel [NHANES]    Frequency of Communication with Friends and Family: More than three times a week    Frequency of Social Gatherings with Friends and Family: More than three times a week    Attends Religious Services: More than 4 times per year    Active Member of [employer] or Organizations: Yes    Attends Banker Meetings: More than 4 times per year    Marital Status: Widowed    Tobacco Counseling Counseling given: Not Answered   Clinical Intake:  Pre-visit preparation completed: Yes  Pain : No/denies pain     BMI - recorded: 21.28 Nutritional Status: BMI of 19-24  Normal Nutritional Risks: None Diabetes: No  How often do you need to have someone help you when you read instructions, pamphlets, or other written materials from your doctor or pharmacy?: 1 - Never  Interpreter Needed?: No  Information entered by :: Theresa Mulligan LPN   Activities of Daily Living    06/15/2023   11:22 AM 04/12/2023    4:55 PM  In your present state of health, do you have any difficulty performing the following activities:  Hearing? 1   Comment Wears Hearing Aids   Vision? 0   Difficulty concentrating or making decisions? 0   Walking or climbing stairs? 1    Comment Uses [employer] or bathing? 0   Doing errands, shopping? 0 0  Preparing Food and eating ? N   Using the Toilet? N   In the past six months, have you accidently leaked urine? N   Do you have problems with loss of bowel control? N  Managing your Medications? N   Managing your Finances? N   Housekeeping or managing your Housekeeping? N     Patient Care Team: Panosh, Neta Mends, MD as PCP - General Wendall Stade, MD as PCP - Cardiology (Cardiology) [place], Amy, MD as Consulting Physician (Dermatology) Levert Feinstein, MD as Consulting Physician (Neurology) Barnett Abu, MD as Consulting Physician (Neurosurgery) Callie Fielding, MD as Consulting Physician (Physical Medicine and Rehabilitation) Genene Churn, DC as Referring Physician (Chiropractic Medicine) Glenford Peers, OD as Referring Physician (Optometry) Genene Churn, DC as Referring Physician (Chiropractic Medicine) Glean Salvo, NP as Nurse Practitioner (Neurology)  Indicate any recent Medical Services you may have received from other than [facility] providers in the past year (date may be approximate).     Assessment:   This is a routine wellness examination for [place].  Hearing/Vision screen Hearing Screening - Comments:: Wears Hearing Aids Vision Screening - Comments:: Wears rx glasses - up to date with routine eye exams with  Dr Harriette Bouillon   Goals Addressed               This Visit's Progress     No current goals (pt-stated)         Depression Screen    06/15/2023   11:21 AM 06/04/2022   10:45 AM 02/19/2022    9:56 AM 09/23/2021   11:16 AM 06/03/2021   10:49 AM 06/01/2021   10:36 AM 05/05/2021    9:36 AM  PHQ 2/9 Scores  PHQ - 2 Score 0 0 0 0 0 0 3  PHQ- 9 Score   0 0 0 0 7    Fall Risk    06/15/2023   11:23 AM 06/04/2022   10:48 AM 02/19/2022    9:56 AM 09/23/2021   11:16 AM 06/03/2021   10:53 AM  Fall Risk   Falls in the past year? 0 0 0 0 0  Number falls in past yr: 0 0 0 0 0   Injury with Fall? 0 0 0 0 0  Risk for fall due to : No Fall Risks No Fall Risks No Fall Risks No Fall Risks No Fall Risks  Follow up Falls prevention discussed;Falls evaluation completed Falls prevention discussed Falls evaluation completed Falls evaluation completed     MEDICARE RISK AT HOME: Medicare Risk at Home Any stairs in or around the home?: Yes If so, are there any without handrails?: No Home free of loose throw rugs in walkways, pet beds, electrical cords, etc?: Yes Adequate lighting in your home to reduce risk of falls?: Yes Life alert?: Yes Use of a cane, walker or w/c?: Yes Grab bars in the bathroom?: Yes Shower chair or bench in shower?: Yes Elevated toilet seat or a handicapped toilet?: Yes  TIMED UP AND GO:  Was the test performed?  No    Cognitive Function:    01/16/2020   10:10 AM 11/13/2018   12:00 PM 04/12/2018   10:31 AM 10/03/2017    9:38 AM 05/11/2017   11:02 AM  MMSE - Mini Mental State Exam  Not completed:   --  --  Orientation to time 5 4 4 5    Orientation to Place 5 5 5 4    Registration 3 3 3 3    Attention/ Calculation 5 4 4 5    Recall 2 3 2 1    Language- name 2 objects 2 2 2 2    Language- repeat 1 1 1 1    Language- follow 3 step command 3 3 3  3  Language- read & follow direction 1 1 1 1    Write a sentence 1 1 1 1    Copy design 1 1 1 1    Total score 29 28 27 27        12/15/2022   12:54 PM 01/19/2022    1:50 PM 10/21/2021   10:26 AM 01/19/2021    1:00 PM  Montreal Cognitive Assessment   Visuospatial/ Executive (0/5) 2 3 3 2   Naming (0/3) 2 2 2 2   Attention: Read list of digits (0/2) 1 1 2 2   Attention: Read list of letters (0/1) 1 1 0 1  Attention: Serial 7 subtraction starting at 100 (0/3) 2 3 3 1   Language: Repeat phrase (0/2) 1 1 2 2   Language : Fluency (0/1) 0 1 0 0  Abstraction (0/2) 1 2 2 2   Delayed Recall (0/5) 0 0 3 0  Orientation (0/6) 5 6 6 6   Total 15 20 23 18       06/15/2023   11:25 AM 06/04/2022   10:48 AM 06/03/2021    10:54 AM 05/30/2020   10:59 AM 05/18/2019   11:10 AM  6CIT Screen  What Year? 0 points 0 points 0 points 0 points 0 points  What month? 0 points 0 points 0 points 0 points 0 points  What time? 0 points 0 points 0 points 0 points 0 points  Count back from 20 0 points 2 points 0 points 0 points 0 points  Months in reverse 0 points 4 points 2 points 0 points 0 points  Repeat phrase 6 points 4 points 4 points 6 points 2 points  Total Score 6 points 10 points 6 points 6 points 2 points    Immunizations Immunization History  Administered Date(s) Administered   Fluad Trivalent(High Dose 65+) 12/29/2022   Influenza Split 02/25/2012   Influenza, High Dose Seasonal PF 01/13/2016, 01/19/2017, 01/04/2018, 12/28/2019, 12/23/2020   Influenza-Unspecified 01/31/2014, 01/09/2019   PFIZER Comirnaty(Gray Top)Covid-19 Tri-Sucrose Vaccine 07/25/2020   PFIZER(Purple Top)SARS-COV-2 Vaccination 05/05/2019, 05/26/2019, 01/21/2020   Pfizer Covid-19 Vaccine Bivalent Booster 53yrs & up 01/13/2021   Pneumococcal Conjugate-13 10/23/2013, 03/11/2014   Pneumococcal Polysaccharide-23 02/18/2016   Td 05/15/2010   Tdap 11/08/2011   Zoster, Live 04/09/2014    TDAP status: Due, Education has been provided regarding the importance of this vaccine. Advised may receive this vaccine at local pharmacy or Health Dept. Aware to provide a copy of the vaccination record if obtained from local pharmacy or Health Dept. Verbalized acceptance and understanding.  Flu Vaccine status: Up to date  Pneumococcal vaccine status: Up to date  Covid-19 vaccine status: Declined, Education has been provided regarding the importance of this vaccine but patient still declined. Advised may receive this vaccine at local pharmacy or Health Dept.or vaccine clinic. Aware to provide a copy of the vaccination record if obtained from local pharmacy or Health Dept. Verbalized acceptance and understanding.  Qualifies for Shingles Vaccine? Yes   Zostavax  completed No   Shingrix Completed?: No.    Education has been provided regarding the importance of this vaccine. Patient has been advised to call insurance company to determine out of pocket expense if they have not yet received this vaccine. Advised may also receive vaccine at local pharmacy or Health Dept. Verbalized acceptance and understanding.  Screening Tests Health Maintenance  Topic Date Due   Zoster Vaccines- Shingrix (1 of 2) 06/30/1948   DTaP/Tdap/Td (3 - Td or Tdap) 11/07/2021   COVID-19 Vaccine (6 - 2024-25 season) 12/26/2022  Medicare Annual Wellness (AWV)  06/14/2024   Pneumonia Vaccine 53+ Years old  Completed   INFLUENZA VACCINE  Completed   DEXA SCAN  Completed   HPV VACCINES  Aged Out    Health Maintenance  Health Maintenance Due  Topic Date Due   Zoster Vaccines- Shingrix (1 of 2) 06/30/1948   DTaP/Tdap/Td (3 - Td or Tdap) 11/07/2021   COVID-19 Vaccine (6 - 2024-25 season) 12/26/2022        Bone Density status: Completed 02/10/15. Results reflect: Bone density results: NORMAL. Repeat every   years.   Additional Screening:   Vision Screening: Recommended annual ophthalmology exams for early detection of glaucoma and other disorders of the eye. Is the patient up to date with their annual eye exam?  Yes  Who is the provider or what is the name of the office in which the patient attends annual eye exams? Dr Harriette Bouillon If pt is not established with a provider, would they like to be referred to a provider to establish care? No .   Dental Screening: Recommended annual dental exams for proper oral hygiene   Community Resource Referral / Chronic Care Management: CRR required this visit?  No   CCM required this visit?  No     Plan:     I have personally reviewed and noted the following in the patient's chart:   Medical and social history Use of alcohol, tobacco or illicit drugs  Current medications and supplements including opioid prescriptions.  Patient is not currently taking opioid prescriptions. Functional ability and status Nutritional status Physical activity Advanced directives List of other physicians Hospitalizations, surgeries, and ER visits in previous 12 months Vitals Screenings to include cognitive, depression, and falls Referrals and appointments  In addition, I have reviewed and discussed with patient certain preventive protocols, quality metrics, and best practice recommendations. A written personalized care plan for preventive services as well as general preventive health recommendations were provided to patient.     Tillie Rung, LPN   4/78/2956   After Visit Summary: (MyChart) Due to this being a telephonic visit, the after visit summary with patients personalized plan was offered to patient via MyChart   Nurse Notes: None

## 2023-06-15 NOTE — Patient Instructions (Addendum)
Susan Ford , Thank you for taking time to come for your Medicare Wellness Visit. I appreciate your ongoing commitment to your health goals. Please review the following plan we discussed and let me know if I can assist you in the future.   Referrals/Orders/Follow-Ups/Clinician Recommendations:   This is a list of the screening recommended for you and due dates:  Health Maintenance  Topic Date Due   Zoster (Shingles) Vaccine (1 of 2) 06/30/1948   DTaP/Tdap/Td vaccine (3 - Td or Tdap) 11/07/2021   COVID-19 Vaccine (6 - 2024-25 season) 12/26/2022   Medicare Annual Wellness Visit  06/14/2024   Pneumonia Vaccine  Completed   Flu Shot  Completed   DEXA scan (bone density measurement)  Completed   HPV Vaccine  Aged Out    Advanced directives: (Copy Requested) Please bring a copy of your health care power of attorney and living will to the office to be added to your chart at your convenience.  Next Medicare Annual Wellness Visit scheduled for next year: Yes

## 2023-06-22 ENCOUNTER — Ambulatory Visit: Payer: Self-pay | Admitting: Internal Medicine

## 2023-06-22 NOTE — Telephone Encounter (Signed)
 Patient called in requesting to reschedule her appointment on Friday, due to transportation issues. Patient was triaged earlier this afternoon. Patient stated that her symptoms had not changed since being triage. This RN scheduled patient an earlier appointment on Friday that would work for her transportation.   Copied from CRM [id number]. Topic: Clinical - Red Word Triage >> Jun 22, 2023  4:19 PM Irine Seal wrote: [facility] that prompted transfer to Nurse Triage: patient present with non productive cough,fever, cold symptoms. Symptoms presented  5 days ago wants acute visit

## 2023-06-22 NOTE — Telephone Encounter (Signed)
  Chief Complaint: cough  Symptoms: fever, cough Frequency: several days   Disposition: [] ED /[] Urgent Care (no appt availability in office) / [x] Appointment(In office/virtual)/ []  [place] [facility]/ [] Home Care/ [] Refused Recommended Disposition /[] [place] [facility]/ []  Follow-up with PCP Additional Notes: Pt calling with complaints of a non-productive cough and fever for the last several days. Pt didn't seem understand RN asking to clarify it fever was 100.3 or 103. It is unclear. Cough sounded wet over phone, but pt states nothing comes up. Pt lives in a retirement home and has to coordinate care with drivers. Pt requested for Friday appt. Pt denies any SOB or chest pain. Per protocol, pt to be seen within 48 hours. Pt has app 2/28 @ 1440. RN gave care advice and pt verbalized understanding.          Copied from CRM [id number]. Topic: Clinical - Red Word Triage >> Jun 22, 2023  3:55 PM Prudencio Pair wrote: Red Word that prompted transfer to Nurse Triage: Patient states she is coughing bad & running a fever. Wants to see her pcp. Reason for Disposition  SEVERE coughing spells (e.g., whooping sound after coughing, vomiting after coughing)  Answer Assessment - Initial Assessment Questions 1. ONSET: "When did the cough begin?"      Several days  2. SEVERITY: "How bad is the cough today?"      Pretty bad  3. SPUTUM: "Describe the color of your sputum" (none, dry cough; clear, white, yellow, green)     no 4. HEMOPTYSIS: "Are you coughing up any blood?" If so ask: "How much?" (flecks, streaks, tablespoons, etc.)     No  5. DIFFICULTY BREATHING: "Are you having difficulty breathing?" If Yes, ask: "How bad is it?" (e.g., mild, moderate, severe)    - MILD: No SOB at rest, mild SOB with walking, speaks normally in sentences, can lie down, no retractions, pulse < 100.    - MODERATE: SOB at rest, SOB with minimal exertion and prefers to sit, cannot lie down flat, speaks in phrases, mild  retractions, audible wheezing, pulse 100-120.    - SEVERE: Very SOB at rest, speaks in single words, struggling to breathe, sitting hunched forward, retractions, pulse > 120      Denies  6. FEVER: "Do you have a fever?" If Yes, ask: "What is your temperature, how was it measured, and when did it start?"     103 7. CARDIAC HISTORY: "Do you have any history of heart disease?" (e.g., heart attack, congestive heart failure)      no 8. LUNG HISTORY: "Do you have any history of lung disease?"  (e.g., pulmonary embolus, asthma, emphysema)     no 9. PE RISK FACTORS: "Do you have a history of blood clots?" (or: recent major surgery, recent prolonged travel, bedridden)     no 10. OTHER SYMPTOMS: "Do you have any other symptoms?" (e.g., runny nose, wheezing, chest pain) fever  Protocols used: Cough - Acute Non-Productive-A-AH

## 2023-06-23 NOTE — Telephone Encounter (Signed)
 Appt is scheduled.   Forwarding to PCP for [facility]

## 2023-06-24 ENCOUNTER — Ambulatory Visit (HOSPITAL_COMMUNITY)
Admission: EM | Admit: 2023-06-24 | Discharge: 2023-06-24 | Disposition: A | Discharge status: Home / Self Care | Payer: Medicare Other | Attending: Emergency Medicine | Admitting: Emergency Medicine

## 2023-06-24 ENCOUNTER — Ambulatory Visit: Payer: Medicare Other | Admitting: Family Medicine

## 2023-06-24 ENCOUNTER — Ambulatory Visit (HOSPITAL_COMMUNITY): Payer: Medicare Other

## 2023-06-24 ENCOUNTER — Ambulatory Visit: Payer: Self-pay | Admitting: Internal Medicine

## 2023-06-24 ENCOUNTER — Encounter (HOSPITAL_COMMUNITY): Payer: Self-pay

## 2023-06-24 DIAGNOSIS — J189 Pneumonia, unspecified organism: Secondary | ICD-10-CM

## 2023-06-24 DIAGNOSIS — R059 Cough, unspecified: Secondary | ICD-10-CM | POA: Diagnosis not present

## 2023-06-24 DIAGNOSIS — R051 Acute cough: Secondary | ICD-10-CM

## 2023-06-24 DIAGNOSIS — J984 Other disorders of lung: Secondary | ICD-10-CM | POA: Diagnosis not present

## 2023-06-24 DIAGNOSIS — J8410 Pulmonary fibrosis, unspecified: Secondary | ICD-10-CM | POA: Diagnosis not present

## 2023-06-24 LAB — POC COVID19/FLU A&B COMBO
Covid Antigen, POC: NEGATIVE
Influenza A Antigen, POC: NEGATIVE
Influenza B Antigen, POC: NEGATIVE

## 2023-06-24 MED ORDER — PREDNISONE 20 MG PO TABS: 40.0000 mg | ORAL_TABLET | Freq: Every day | ORAL | 0 refills | Status: AC

## 2023-06-24 MED ORDER — AZITHROMYCIN 250 MG PO TABS
250.0000 mg | ORAL_TABLET | Freq: Every day | ORAL | 0 refills | Status: DC
Start: 2023-06-24 — End: 2023-07-06

## 2023-06-24 MED ORDER — AMOXICILLIN-POT CLAVULANATE 875-125 MG PO TABS: 1.0000 | ORAL_TABLET | Freq: Two times a day (BID) | ORAL | 0 refills | Status: DC

## 2023-06-24 NOTE — ED Triage Notes (Addendum)
 Patient reports that she has had a fever and a cough x 5 days.  Patient states she has been taking Delsym for her cough. Patient added that she had taken Tylenol approx 2 hours ago for her fever.

## 2023-06-24 NOTE — Telephone Encounter (Signed)
 Spoke to pt.   Pt reports she cannot get access to her mychart account to do a virtual or e-visit. Advise pt to go to urgent care as there are no availability at this office.   Ask pt if she has family member or friend who can take her to a nearby urgent care. Pt states she will find someone to take her to an urgent care. She was coughing during the conversation.   Advise pt to give [place] an update. Pt verbalized understanding.

## 2023-06-24 NOTE — ED Provider Notes (Addendum)
 [facility]    CSN: [id number] Arrival date & time: 06/24/23  1541      History   Chief Complaint Chief Complaint  Patient presents with   Fever   Cough    HPI Susan Ford is a 89 y.o. female.   Patient presents to clinic over concerns of a cough and fever for the past 5 days.  She has not measured her temperature today, yesterday when her vitals were checked her temperature was 103.  She lives at [facility], a retirement facility.  She has been taking Delsym for cough as well as Tylenol, last dose around 2 hours ago.  Denies any wheezing or shortness of breath.  No history of asthma.  Reports her cough is junky, but nonproductive.  Denies any congestion.  Denies sore throat.  The history is provided by the patient and medical records.  Fever Cough   Past Medical History:  Diagnosis Date   Arthritis    Cataract 2007   bilateral cat ext    Cholecystitis    Complication of anesthesia Feb 21, 2014   atrial fib after cholecystectomy, lasted a few hours, none since   Dysrhythmia 02-21-2014   after cholecystectomy had atrial fib for a few hours, none since   GERD (gastroesophageal reflux disease)    Had endoscopy no history of Barrett's   Hx of nonmelanoma skin cancer    Unspecified hereditary and idiopathic peripheral neuropathy     Patient Active Problem List   Diagnosis Date Noted   ABLA (acute blood loss anemia) April 24, 2023   History of bleeding peptic ulcer 04/24/23   History of pulmonary embolism, 12/28/22 04-24-2023   Chronic anticoagulation Apr 24, 2023   Acute GI bleeding 04-24-2023   Acute UTI April 24, 2023   Pulmonary embolism (HCC) 12/28/2022   Diarrhea 12/28/2022   Melena, with acute blood loss anemia 07/08/2022   Dementia without behavioral disturbance (HCC) 10/03/2017   Gait abnormality 06/29/2016   Chronic bilateral low back pain without sciatica 06/29/2016   Memory loss October 27, 2015   Death of husband 04-23-14   Bereavement due to  life event 2014/04/23   Absolute anemia 04/08/2014   Atrial flutter (HCC) 02/23/2014   Acute calculous cholecystitis 02/21/2014   Spinal stenosis of lumbar region 01/04/2014   Recent bereavement 10/25/2013   Arthritis 10/25/2013   Hyperlipidemia 10/23/2013   Hereditary and idiopathic peripheral neuropathy    Nausea alone 07/14/2012   Neuropathy, peripheral 05/05/2011   High risk medication use 05/05/2011   Subcutaneous emphysema (HCC) 05/05/2011   Cough 04/03/2011   Neuropathy 04/03/2011   Osteoarthritis 05/15/2010   STIFFNESS OF JOINT NEC ANKLE AND FOOT 05/15/2010   GANGLION CYST, WRIST, RIGHT 05/15/2010   VERTIGO, BENIGN PAROXYSMAL POSITION 01/31/2007    Past Surgical History:  Procedure Laterality Date   ABDOMINAL HYSTERECTOMY  15 yrs ago   complete   BIOPSY  07/09/2022   Procedure: BIOPSY;  Surgeon: Kathi Der, MD;  Location: [facility];  Service: Gastroenterology;;   BIOPSY  04/13/2023   Procedure: BIOPSY;  Surgeon: Vida Rigger, MD;  Location: [facility];  Service: Gastroenterology;;   BREAST CYST ASPIRATION     CHOLECYSTECTOMY N/A 02/21/2014   Procedure: LAPAROSCOPIC CHOLECYSTECTOMY WITH INTRAOPERATIVE CHOLANGIOGRAM;  Surgeon: Frederik Schmidt, MD;  Location: [facility];  Service: General;  Laterality: N/A;   ENDOSCOPIC RETROGRADE CHOLANGIOPANCREATOGRAPHY (ERCP) WITH PROPOFOL N/A 05/29/2014   Procedure: ENDOSCOPIC RETROGRADE CHOLANGIOPANCREATOGRAPHY (ERCP) WITH PROPOFOL;  Surgeon: Willis Modena, MD;  Location: [facility];  Service: Endoscopy;  Laterality:  N/A;   ERCP N/A 02/24/2014   Procedure: ENDOSCOPIC RETROGRADE CHOLANGIOPANCREATOGRAPHY (ERCP);  Surgeon: Willis Modena, MD;  Location[facility];  Service: Endoscopy;  Laterality: N/A;   ESOPHAGOGASTRODUODENOSCOPY (EGD) WITH PROPOFOL N/A 07/09/2022   Procedure: ESOPHAGOGASTRODUODENOSCOPY (EGD) WITH PROPOFOL;  Surgeon: Kathi Der, MD;  Location: [facility];  Service: Gastroenterology;  Laterality: N/A;    ESOPHAGOGASTRODUODENOSCOPY (EGD) WITH PROPOFOL N/A 04/13/2023   Procedure: ESOPHAGOGASTRODUODENOSCOPY (EGD) WITH PROPOFOL;  Surgeon: Vida Rigger, MD;  Location: [facility];  Service: Gastroenterology;  Laterality: N/A;   EYE SURGERY Bilateral 2007   both eyes cataracts with lens replacments   LUMBAR LAMINECTOMY/DECOMPRESSION MICRODISCECTOMY  02/01/2012   Procedure: LUMBAR LAMINECTOMY/DECOMPRESSION MICRODISCECTOMY 2 LEVELS;  Surgeon: Barnett Abu, MD;  Location: [facility];  Service: Neurosurgery;  Laterality: Left;  Left Lumbar Four-Five Lumbar Five-Sacral One Laminectomies/Foraminotomies/Microscope   SPYGLASS CHOLANGIOSCOPY N/A 05/29/2014   Procedure: SPYGLASS CHOLANGIOSCOPY;  Surgeon: Willis Modena, MD;  Location: [facility];  Service: Endoscopy;  Laterality: N/A;   TONSILLECTOMY  age 4    OB History   No obstetric history on file.      Home Medications    Prior to Admission medications   Medication Sig Start Date End Date Taking? Authorizing Provider  amoxicillin-clavulanate (AUGMENTIN) 875-125 MG tablet Take 1 tablet by mouth every 12 (twelve) hours. 06/24/23  Yes Rinaldo Ratel, [place] N, FNP  azithromycin (ZITHROMAX) 250 MG tablet Take 1 tablet (250 mg total) by mouth daily. Take first 2 tablets together, then 1 every day until finished. 06/24/23  Yes Rinaldo Ratel, [place] N, FNP  predniSONE (DELTASONE) 20 MG tablet Take 2 tablets (40 mg total) by mouth daily with breakfast for 5 days. 06/24/23 06/29/23 Yes Rinaldo Ratel, [place] N, FNP  acetaminophen (TYLENOL) 325 MG tablet Take 650 mg by mouth every 6 (six) hours as needed for moderate pain (pain score 4-6) or mild pain (pain score 1-3).    [provider]  Apoaequorin (PREVAGEN) 10 MG CAPS Take 10 mg by mouth daily.    [provider]  Ascorbic Acid (VITAMIN C) 1000 MG tablet Take 1,000 mg by mouth daily.    [provider]  gabapentin (NEURONTIN) 300 MG capsule Take 1 capsule (300 mg total) by mouth 4 (four) times  daily. 11/03/22   Panosh, Neta Mends, MD  hydrocortisone cream 1 % Apply topically 2 (two) times daily as needed for itching. Patient not taking: Reported on 04/10/2023 01/01/23   Lanae Boast, MD  loratadine (ALLERGY RELIEF) 10 MG tablet Take 10 mg by mouth every evening. Patient not taking: Reported on 04/10/2023    [provider]  Multiple Vitamin (MULTIVITAMIN) tablet Take 1 tablet by mouth daily after lunch.    [provider]  ondansetron (ZOFRAN-ODT) 4 MG disintegrating tablet Take 1 tablet (4 mg total) by mouth every 8 (eight) hours as needed. Patient not taking: Reported on 04/12/2023 04/10/23   Jacalyn Lefevre, MD  pantoprazole (PROTONIX) 40 MG tablet Take 1 tablet (40 mg total) by mouth daily. Patient not taking: Reported on 05/11/2023 04/15/23 04/09/24  Meredeth Ide, MD  polyethylene glycol (MIRALAX / GLYCOLAX) 17 g packet Take 17 g by mouth daily. Patient taking differently: Take 17 g by mouth as needed for moderate constipation or mild constipation. 01/02/23   Lanae Boast, MD  senna (SENOKOT) 8.6 MG TABS tablet Take 1 tablet (8.6 mg total) by mouth daily. Patient taking differently: Take 2 tablets by mouth every other day. 01/02/23   Lanae Boast, MD  simvastatin (ZOCOR) 10 MG  tablet Take 1 tablet (10 mg total) by mouth daily. 12/20/22   Panosh, Neta Mends, MD    Family History Family History  Problem Relation Age of Onset   Neurodegenerative disease Mother        Shy Drager died at 58   Alcohol abuse Other    Heart disease Other     Social History Social History   Tobacco Use   Smoking status: Former    Current packs/day: 0.00    Average packs/day: 0.3 packs/day for 7.0 years (1.8 ttl pk-yrs)    Types: Cigarettes    Start date: 04/26/1956    Quit date: 04/27/1963    Years since quitting: 60.2   Smokeless tobacco: Never  Vaping Use   Vaping status: Never Used  Substance Use Topics   Alcohol use: Yes    Alcohol/week: 7.0 standard drinks of alcohol    Types: 7  Glasses of wine per week    Comment: 1 glass of wine daily   Drug use: No    Types: Hydrocodone     Allergies   Keflex [cephalexin], Lyrica [pregabalin], Oxytetracycline, and Memantine   Review of Systems Review of Systems  Per HPI   Physical Exam Triage Vital Signs ED Triage Vitals [06/24/23 1700]  Encounter Vitals Group     BP 134/80     Systolic BP Percentile      Diastolic BP Percentile      Pulse Rate 94     Resp 18     Temp 98.4 F (36.9 C)     Temp Source Oral     SpO2 95 %     Weight      Height      Head Circumference      Peak Flow      Pain Score 0     Pain Loc      Pain Education      Exclude from Growth Chart    No data found.  Updated Vital Signs BP 134/80 (BP Location: Left Arm)   Pulse 94   Temp 98.4 F (36.9 C) (Oral)   Resp 18   SpO2 95%   Visual Acuity Right Eye Distance:   Left Eye Distance:   Bilateral Distance:    Right Eye Near:   Left Eye Near:    Bilateral Near:     Physical Exam Vitals and nursing note reviewed.  Constitutional:      Appearance: Normal appearance.  HENT:     Head: Normocephalic and atraumatic.     Right Ear: External ear normal.     Left Ear: External ear normal.     Nose: Nose normal.     Mouth/Throat:     Mouth: Mucous membranes are moist.  Eyes:     Conjunctiva/sclera: Conjunctivae normal.  Cardiovascular:     Rate and Rhythm: Normal rate and regular rhythm.     Heart sounds: Normal heart sounds. No murmur heard. Pulmonary:     Effort: Pulmonary effort is normal.     Breath sounds: Decreased air movement present. Examination of the right-upper field reveals decreased breath sounds. Examination of the left-upper field reveals decreased breath sounds. Examination of the right-middle field reveals rhonchi. Examination of the left-middle field reveals rhonchi. Examination of the right-lower field reveals rhonchi. Examination of the left-lower field reveals rhonchi. Decreased breath sounds and rhonchi  present.  Musculoskeletal:        General: Normal range of motion.  Skin:    General:  Skin is warm and dry.  Neurological:     General: No focal deficit present.     Mental Status: She is alert and oriented to person, place, and time.  Psychiatric:        Mood and Affect: Mood normal.        Behavior: Behavior normal. Behavior is cooperative.      UC Treatments / Results  Labs (all labs ordered are listed, but only abnormal results are displayed) Labs Reviewed  POC COVID19/FLU A&B COMBO - Normal    EKG   Radiology DG Chest 2 View Result Date: 06/24/2023 CLINICAL DATA:  Cough EXAM: CHEST - 2 VIEW COMPARISON:  05/11/2023 FINDINGS: Fine reticular opacity at the bases consistent with fibrosis and chronic lung disease. No acute airspace disease, pleural effusion or pneumothorax. Stable cardiomediastinal silhouette. IMPRESSION: Chronic lung disease and fibrosis.  No acute airspace disease Electronically Signed   By: Jasmine Pang M.D.   On: 06/24/2023 18:32    Procedures Procedures (including critical care time)  Medications Ordered in UC Medications - No data to display  Initial Impression / Assessment and Plan / UC Course  I have reviewed the triage vital signs and the nursing notes.  Pertinent labs & imaging results that were available during my care of the patient were reviewed by me and considered in my medical decision making (see chart for details).  Vitals and triage reviewed, patient is hemodynamically stable.  Diminished lung sounds in upper lobes with rhonchi throughout and lower lobes.  Chest x-ray by my interpretation is concerning for a right lower lobe pneumonia.  Will cover with Augmentin plus azithromycin due to her age.  CURB-65 score is 1, no confusion, no tachypnea.  Patient is alert and oriented and provides her own history.  Will send in prednisone burst for rhonchi.  [facility] called for transportation back to her residence.  Plan of care, follow-up care  return precautions given, no questions at this time.  Radiology interpretation does not show any acute cardiopulmonary disease.  Due to her fever, cough and abnormal lung sounds, will maintain dual antibiotic therapy for CAP.     Final Clinical Impressions(s) / UC Diagnoses   Final diagnoses:  Acute cough  Community acquired pneumonia of right lung, unspecified part of lung     Discharge Instructions      Your imaging was concerning for pneumonia.  Take the antibiotics as prescribed and until finished, even if you start feeling better.  Take them with food to prevent gastrointestinal upset.  Starting tomorrow you can take the prednisone daily with breakfast.  Ensure you are staying well-hydrated and getting plenty of rest.  You can continue to take Tylenol as needed.  Do not hesitate to seek follow-up care if you develop any worsening shortness of breath, confusion, high fevers, or no improvement despite medication.     ED Prescriptions     Medication Sig Dispense Auth. Provider   amoxicillin-clavulanate (AUGMENTIN) 875-125 MG tablet Take 1 tablet by mouth every 12 (twelve) hours. 14 tablet Rinaldo Ratel, [place] N, [place]   azithromycin (ZITHROMAX) 250 MG tablet Take 1 tablet (250 mg total) by mouth daily. Take first 2 tablets together, then 1 every day until finished. 6 tablet Rinaldo Ratel, [place] N, [place]   predniSONE (DELTASONE) 20 MG tablet Take 2 tablets (40 mg total) by mouth daily with breakfast for 5 days. 10 tablet Glendene Wyer, [place] N, FNP      PDMP not reviewed this encounter.   Rinaldo Ratel, [place]  N, FNP 06/24/23 1823    Tevion Laforge, [place] N, [place] 06/24/23 2007

## 2023-06-24 NOTE — Telephone Encounter (Signed)
 Copied from CRM [id number]. Topic: Clinical - Red Word Triage >> Jun 24, 2023 12:50 PM Alvino Blood C wrote: Red Word that prompted transfer to Nurse Triage: Patient has had a sever cough for the last 4-5 days and now a fever of 101.00  Chief Complaint: Cough Symptoms: Fever, runny nose, body aches Frequency: Since Monday Pertinent Negatives: Patient denies SOB Disposition: [] ED /[] Urgent Care (no appt availability in office) / [x] Appointment(In office/virtual)/ []  [facility]/ [] Home Care/ [x] Refused Recommended Disposition /[] [facility]/ []  Follow-up with PCP Additional Notes: Patient called in to report a cough and fever that have been ongoing since Monday. Patient stated the cough feels productive, but she cannot cough anything up. Patient denied coughing spells, but coughed very frequently while on the phone with this RN. Patient stated she had a fever around 9am this morning, but was unable to recall temperature. Patient stated that she was supposed to have an appointment in the office today, but had to cancel it because she is in insolation. Patient stated she lives in a retirement home, but could not expand on what "isolation" means. Patient denied SOB and chest pain. This RN advised to see a provider within, 24 hours. No availability with PCP or in PCP office. This RN offered the option of a virtual appointment, given the patient's "isolation", and patient stated she did not understand and does not have anyone who can help her access a virtual appointment. Patient also stated that she does not have transportation that can take her out of the [place] area today. Patient needs an appointment. This RN advised patient that I would route this conversation to the office, for their discretion on scheduling. Patient is requesting a call back at [phone].  Reason for Disposition  [1] Continuous (nonstop) coughing interferes with work or school AND [2] no improvement using cough  treatment per Care Advice  Answer Assessment - Initial Assessment Questions 1. ONSET: "When did the cough begin?"      States a few days 2. SEVERITY: "How bad is the cough today?"      Denies coughing spells, but states she coughs when she talks  3. SPUTUM: "Describe the color of your sputum" (none, dry cough; clear, white, yellow, green)     States cough is productive, but she has not been able to cough anything up 4. HEMOPTYSIS: "Are you coughing up any blood?" If so ask: "How much?" (flecks, streaks, tablespoons, etc.)     Denies 5. DIFFICULTY BREATHING: "Are you having difficulty breathing?" If Yes, ask: "How bad is it?" (e.g., mild, moderate, severe)    - MILD: No SOB at rest, mild SOB with walking, speaks normally in sentences, can lie down, no retractions, pulse < 100.    - MODERATE: SOB at rest, SOB with minimal exertion and prefers to sit, cannot lie down flat, speaks in phrases, mild retractions, audible wheezing, pulse 100-120.    - SEVERE: Very SOB at rest, speaks in single words, struggling to breathe, sitting hunched forward, retractions, pulse > 120      Denies 6. FEVER: "Do you have a fever?" If Yes, ask: "What is your temperature, how was it measured, and when did it start?"     States she had a temperature at 9 am this morning, but she does not remember what it was 7. CARDIAC HISTORY: "Do you have any history of heart disease?" (e.g., heart attack, congestive heart failure)      Denies 8. LUNG HISTORY: "Do you  have any history of lung disease?"  (e.g., pulmonary embolus, asthma, emphysema)     Denies 10. OTHER SYMPTOMS: "Do you have any other symptoms?" (e.g., runny nose, wheezing, chest pain)      Runny nose, body aches, denies sore throat, denies wheezing, denies chest pain  Protocols used: Cough - Acute Productive-A-AH

## 2023-06-24 NOTE — Discharge Instructions (Signed)
 Your imaging was concerning for pneumonia.  Take the antibiotics as prescribed and until finished, even if you start feeling better.  Take them with food to prevent gastrointestinal upset.  Starting tomorrow you can take the prednisone daily with breakfast.  Ensure you are staying well-hydrated and getting plenty of rest.  You can continue to take Tylenol as needed.  Do not hesitate to seek follow-up care if you develop any worsening shortness of breath, confusion, high fevers, or no improvement despite medication.

## 2023-06-26 ENCOUNTER — Other Ambulatory Visit: Payer: Self-pay | Admitting: Internal Medicine

## 2023-06-29 ENCOUNTER — Ambulatory Visit: Payer: Medicare Other | Admitting: Internal Medicine

## 2023-06-30 ENCOUNTER — Other Ambulatory Visit: Payer: Self-pay

## 2023-06-30 ENCOUNTER — Emergency Department (HOSPITAL_COMMUNITY)

## 2023-06-30 ENCOUNTER — Encounter (HOSPITAL_COMMUNITY): Payer: Self-pay

## 2023-06-30 ENCOUNTER — Emergency Department (HOSPITAL_COMMUNITY)
Admission: EM | Admit: 2023-06-30 | Discharge: 2023-06-30 | Disposition: A | Discharge status: Home / Self Care | Attending: Emergency Medicine | Admitting: Emergency Medicine

## 2023-06-30 DIAGNOSIS — R531 Weakness: Secondary | ICD-10-CM | POA: Diagnosis not present

## 2023-06-30 DIAGNOSIS — J101 Influenza due to other identified influenza virus with other respiratory manifestations: Secondary | ICD-10-CM | POA: Insufficient documentation

## 2023-06-30 DIAGNOSIS — Z7401 Bed confinement status: Secondary | ICD-10-CM | POA: Diagnosis not present

## 2023-06-30 DIAGNOSIS — R509 Fever, unspecified: Secondary | ICD-10-CM | POA: Diagnosis not present

## 2023-06-30 DIAGNOSIS — M549 Dorsalgia, unspecified: Secondary | ICD-10-CM | POA: Diagnosis not present

## 2023-06-30 DIAGNOSIS — M545 Low back pain, unspecified: Secondary | ICD-10-CM | POA: Diagnosis not present

## 2023-06-30 DIAGNOSIS — R059 Cough, unspecified: Secondary | ICD-10-CM | POA: Diagnosis not present

## 2023-06-30 LAB — URINALYSIS, ROUTINE W REFLEX MICROSCOPIC
Bacteria, UA: NONE SEEN
Bilirubin Urine: NEGATIVE
Glucose, UA: NEGATIVE mg/dL
Hgb urine dipstick: NEGATIVE
Ketones, ur: 5 mg/dL — AB
Nitrite: NEGATIVE
Protein, ur: NEGATIVE mg/dL
Specific Gravity, Urine: 1.024 (ref 1.005–1.030)
pH: 5 (ref 5.0–8.0)

## 2023-06-30 LAB — CBC WITH DIFFERENTIAL/PLATELET
Abs Immature Granulocytes: 0.01 10*3/uL (ref 0.00–0.07)
Basophils Absolute: 0 10*3/uL (ref 0.0–0.1)
Basophils Relative: 1 %
Eosinophils Absolute: 0 10*3/uL (ref 0.0–0.5)
Eosinophils Relative: 1 %
HCT: 39.4 % (ref 36.0–46.0)
Hemoglobin: 12.8 g/dL (ref 12.0–15.0)
Immature Granulocytes: 0 %
Lymphocytes Relative: 25 %
Lymphs Abs: 1.2 10*3/uL (ref 0.7–4.0)
MCH: 30.2 pg (ref 26.0–34.0)
MCHC: 32.5 g/dL (ref 30.0–36.0)
MCV: 92.9 fL (ref 80.0–100.0)
Monocytes Absolute: 0.6 10*3/uL (ref 0.1–1.0)
Monocytes Relative: 13 %
Neutro Abs: 3 10*3/uL (ref 1.7–7.7)
Neutrophils Relative %: 60 %
Platelets: 316 10*3/uL (ref 150–400)
RBC: 4.24 MIL/uL (ref 3.87–5.11)
RDW: 12.2 % (ref 11.5–15.5)
WBC: 5 10*3/uL (ref 4.0–10.5)
nRBC: 0 % (ref 0.0–0.2)

## 2023-06-30 LAB — COMPREHENSIVE METABOLIC PANEL
ALT: 14 U/L (ref 0–44)
AST: 25 U/L (ref 15–41)
Albumin: 3.3 g/dL — ABNORMAL LOW (ref 3.5–5.0)
Alkaline Phosphatase: 66 U/L (ref 38–126)
Anion gap: 9 (ref 5–15)
BUN: 10 mg/dL (ref 8–23)
CO2: 22 mmol/L (ref 22–32)
Calcium: 8.5 mg/dL — ABNORMAL LOW (ref 8.9–10.3)
Chloride: 98 mmol/L (ref 98–111)
Creatinine, Ser: 0.71 mg/dL (ref 0.44–1.00)
GFR, Estimated: >60 mL/min (ref >60)
Glucose, Bld: 109 mg/dL — ABNORMAL HIGH (ref 70–99)
Potassium: 3.9 mmol/L (ref 3.5–5.1)
Sodium: 129 mmol/L — ABNORMAL LOW (ref 135–145)
Total Bilirubin: 0.5 mg/dL (ref 0.0–1.2)
Total Protein: 6.8 g/dL (ref 6.5–8.1)

## 2023-06-30 LAB — RESP PANEL BY RT-PCR (RSV, FLU A&B, COVID)  RVPGX2
Influenza A by PCR: POSITIVE — AB
Influenza B by PCR: NEGATIVE
Resp Syncytial Virus by PCR: NEGATIVE
SARS Coronavirus 2 by RT PCR: NEGATIVE

## 2023-06-30 MED ORDER — ACETAMINOPHEN 500 MG PO TABS
1000.0000 mg | ORAL_TABLET | Freq: Once | ORAL | Status: AC
  Administered 2023-06-30: 1000 mg via ORAL
  Filled 2023-06-30: qty 2

## 2023-06-30 NOTE — ED Triage Notes (Signed)
 Pr came from [facility]. Facility staff also took temperature and reports it was 100.1. Pt reports R flank pain. Received Tylenol 1 hour ago. No other symptoms reported.

## 2023-06-30 NOTE — ED Notes (Signed)
 [facility] called for pt transport back to [facility]

## 2023-06-30 NOTE — ED Notes (Signed)
 Pulse ox 93-95% when ambulating

## 2023-06-30 NOTE — ED Notes (Signed)
 [facility] picked pt up for transport back to [facility]

## 2023-06-30 NOTE — ED Notes (Signed)
 Pt states she does not feel comfortable going home at this time and wants to be admitted. Durene Cal, [place] aware

## 2023-06-30 NOTE — Discharge Instructions (Signed)
 You were evaluated in the emergency room for cough and congestion.  You tested positive for influenza.please finish your antibiotics.  You may use Tylenol 1000 mg every 4-6 hours up to 3 times a day for fever or body aches.  Please keep in mind that this dosing is not meant to be continued long-term and that many over-the-counter cough and flu medications contain acetaminophen or ibuprofen. You can expect your current symptoms to linger over the next week or two but please return to the emergency room if you experience any new or worsening symptoms including persistent fevers, worsening productive cough and persistent vomiting. Please follow-up with your primary care provider regarding your ER visit.  It is your responsibility to obtain any results from your visit and review any findings with your primary care doctor.

## 2023-06-30 NOTE — ED Provider Notes (Signed)
 [place] EMERGENCY DEPARTMENT AT [facility] Provider Note   CSN: [id number] Arrival date & time: 06/30/23  1347     History  Chief Complaint  Patient presents with   Cough    Susan Ford is a 89 y.o. female who presents with cough and fever.  Symptoms have been ongoing for the past 2 weeks.  Cough is nonproductive.  Was evaluated at urgent care.  Respiratory panel was negative at that time.  Started on Augmentin this past Sunday for presumed pneumonia.   Reports symptoms have only persisted.  States she has reported 103 101 fever.  Endorses new onset of right flank pain.  No urinary symptoms.   Cough     Past Medical History:  Diagnosis Date   Arthritis    Cataract 2007   bilateral cat ext    Cholecystitis    Complication of anesthesia Feb 21, 2014   atrial fib after cholecystectomy, lasted a few hours, none since   Dysrhythmia 02-21-2014   after cholecystectomy had atrial fib for a few hours, none since   GERD (gastroesophageal reflux disease)    Had endoscopy no history of Barrett's   Hx of nonmelanoma skin cancer    Unspecified hereditary and idiopathic peripheral neuropathy      Home Medications Prior to Admission medications   Medication Sig Start Date End Date Taking? Authorizing Provider  acetaminophen (TYLENOL) 325 MG tablet Take 650 mg by mouth every 6 (six) hours as needed for moderate pain (pain score 4-6) or mild pain (pain score 1-3).    [provider]  amoxicillin-clavulanate (AUGMENTIN) 875-125 MG tablet Take 1 tablet by mouth every 12 (twelve) hours. 06/24/23   Garrison, [place] N, FNP  Apoaequorin (PREVAGEN) 10 MG CAPS Take 10 mg by mouth daily.    [provider]  Ascorbic Acid (VITAMIN C) 1000 MG tablet Take 1,000 mg by mouth daily.    [provider]  azithromycin (ZITHROMAX) 250 MG tablet Take 1 tablet (250 mg total) by mouth daily. Take first 2 tablets together, then 1 every day until finished. 06/24/23    Garrison, [place] N, FNP  gabapentin (NEURONTIN) 300 MG capsule Take 1 capsule (300 mg total) by mouth 4 (four) times daily. 11/03/22   Panosh, Neta Mends, MD  hydrocortisone cream 1 % Apply topically 2 (two) times daily as needed for itching. Patient not taking: Reported on 04/10/2023 01/01/23   Lanae Boast, MD  loratadine (ALLERGY RELIEF) 10 MG tablet Take 10 mg by mouth every evening. Patient not taking: Reported on 04/10/2023    [provider]  Multiple Vitamin (MULTIVITAMIN) tablet Take 1 tablet by mouth daily after lunch.    [provider]  ondansetron (ZOFRAN-ODT) 4 MG disintegrating tablet Take 1 tablet (4 mg total) by mouth every 8 (eight) hours as needed. Patient not taking: Reported on 04/12/2023 04/10/23   Jacalyn Lefevre, MD  pantoprazole (PROTONIX) 40 MG tablet Take 1 tablet (40 mg total) by mouth daily. Patient not taking: Reported on 05/11/2023 04/15/23 04/09/24  Meredeth Ide, MD  polyethylene glycol (MIRALAX / GLYCOLAX) 17 g packet Take 17 g by mouth daily. Patient taking differently: Take 17 g by mouth as needed for moderate constipation or mild constipation. 01/02/23   Lanae Boast, MD  senna (SENOKOT) 8.6 MG TABS tablet Take 1 tablet (8.6 mg total) by mouth daily. Patient taking differently: Take 2 tablets by mouth every other day. 01/02/23   Lanae Boast, MD  simvastatin (ZOCOR) 10  MG tablet TAKE 1 TABLET BY MOUTH EVERY DAY 06/27/23   Panosh, Neta Mends, MD      Allergies    Keflex [cephalexin], Lyrica [pregabalin], Oxytetracycline, and Memantine    Review of Systems   Review of Systems  Respiratory:  Positive for cough.     Physical Exam Updated Vital Signs BP (!) 152/131   Pulse (!) 117   Temp 97.9 F (36.6 C) (Oral)   Resp 18   Ht 5\' 4"  (1.626 m)   Wt 56.2 kg   SpO2 91%   BMI 21.27 kg/m  Physical Exam Vitals and nursing note reviewed.  Constitutional:      General: She is not in acute distress.    Appearance: She is well-developed.  HENT:      Head: Normocephalic and atraumatic.  Eyes:     Conjunctiva/sclera: Conjunctivae normal.  Cardiovascular:     Rate and Rhythm: Normal rate and regular rhythm.     Heart sounds: No murmur heard. Pulmonary:     Effort: Pulmonary effort is normal. No respiratory distress.     Comments: Bibasilar Rales without wheezes Abdominal:     Palpations: Abdomen is soft.     Tenderness: There is no abdominal tenderness.  Musculoskeletal:        General: No swelling.     Cervical back: Neck supple.  Skin:    General: Skin is warm and dry.     Capillary Refill: Capillary refill takes less than 2 seconds.  Neurological:     Mental Status: She is alert.  Psychiatric:        Mood and Affect: Mood normal.     ED Results / Procedures / Treatments   Labs (all labs ordered are listed, but only abnormal results are displayed) Labs Reviewed  RESP PANEL BY RT-PCR (RSV, FLU A&B, COVID)  RVPGX2 - Abnormal; Notable for the following components:      Result Value   Influenza A by PCR POSITIVE (*)    All other components within normal limits  COMPREHENSIVE METABOLIC PANEL - Abnormal; Notable for the following components:   Sodium 129 (*)    Glucose, Bld 109 (*)    Calcium 8.5 (*)    Albumin 3.3 (*)    All other components within normal limits  URINALYSIS, ROUTINE W REFLEX MICROSCOPIC - Abnormal; Notable for the following components:   Ketones, ur 5 (*)    Leukocytes,Ua TRACE (*)    All other components within normal limits  CBC WITH DIFFERENTIAL/PLATELET    EKG None  Radiology DG Chest 2 View Result Date: 06/30/2023 CLINICAL DATA:  Cough. EXAM: CHEST - 2 VIEW COMPARISON:  June 24, 2023. FINDINGS: The heart size and mediastinal contours are within normal limits. No acute pulmonary disease is noted. The visualized skeletal structures are unremarkable. IMPRESSION: No active cardiopulmonary disease. Electronically Signed   By: Lupita Raider M.D.   On: 06/30/2023 17:34    Procedures Procedures     Medications Ordered in ED Medications  acetaminophen (TYLENOL) tablet 1,000 mg (1,000 mg Oral Given 06/30/23 1931)    ED Course/ Medical Decision Making/ A&P                                 Medical Decision Making Amount and/or Complexity of Data Reviewed Labs: ordered. Radiology: ordered.   This patient presents to the ED with chief complaint(s) of cough.  The complaint involves an  extensive differential diagnosis and also carries with it a high risk of complications and morbidity.   Pertinent past medical history as listed in HPI  The differential diagnosis includes  viral illness, pharyngitis, mono, sinusitis,AOM, pneumonia  The initial plan is to  Obtain respiratory panel Additional history obtained: Records reviewed Care Everywhere/External Records  Initial Assessment:   Hemodynamically stable, afebrile, nontoxic-appearing patient presenting with nonproductive cough and fever x 2 weeks.  Is currently on Augmentin with 2 remaining days for presumed pneumonia without significant improvement of symptoms.  Endorses right flank pain without urinary symptoms.  She points to her right lower lobe.  Low risk per curb 65. she does have bibasilar crackles without wheezes, worse on the right.  No CVAT.  Low suspicion for nephrolithiasis or pyelonephritis.  Regardless we will obtain urine.  Suspect symptoms are pleuritic secondary to improving pneumonia.  Independent ECG interpretation:  None   Independent labs interpretation:  The following labs were independently interpreted:  CBC unremarkable, positive for influenza A, UA with trace leukocytes, negative nitrates, CMP with mild hyponatremia of 129  Independent visualization and interpretation of imaging: I independently visualized the following imaging with scope of interpretation limited to determining acute life threatening conditions related to emergency care: Chest x-ray, which revealed no cardiopulmonary disease  Treatment and  Reassessment: No medications administered during visit   With normal vitals, unremarkable imaging and lab work without significant abnormality.  Plan for discharge home.  Patient reports some concern with going home.  She was ambulated on pulse ox, saturations remained 92-95%.  Discussed discharge plan with close PCP follow-up.  Patient is understanding. Consultations obtained:   None   Disposition:   Patient will be discharged home.  Educated on supportive care.  Encouraged to follow-up with primary care provider should her symptoms persist. The patient has been appropriately medically screened and/or stabilized in the ED. I have low suspicion for any other emergent medical condition which would require further screening, evaluation or treatment in the ED or require inpatient management. At time of discharge the patient is hemodynamically stable and in no acute distress. I have discussed work-up results and diagnosis with patient and answered all questions. Patient is agreeable with discharge plan. We discussed strict return precautions for returning to the emergency department and they verbalized understanding.     Social Determinants of Health:   none  This note was dictated with voice recognition software.  Despite best efforts at proofreading, errors may have occurred which can change the documentation meaning.          Final Clinical Impression(s) / ED Diagnoses Final diagnoses:  Influenza A    Rx / DC Orders ED Discharge Orders     None         Fabienne Bruns 06/30/23 2132    Rolan Bucco, MD 07/01/23 [phone]

## 2023-06-30 NOTE — ED Notes (Signed)
 Ptar recalled regarding pt going back home due to pt refusing when they previously came

## 2023-07-03 ENCOUNTER — Other Ambulatory Visit: Payer: Self-pay | Admitting: Internal Medicine

## 2023-07-04 NOTE — Telephone Encounter (Signed)
 Copied from CRM [id number]. Topic: Clinical - Medication Refill >> Jul 04, 2023 10:16 AM Myrtice Lauth wrote: Most Recent Primary Care Visit:  Provider: Tillie Rung  Department: [facility]  Visit Type: MEDICARE AWV, SEQUENTIAL  Date: 06/15/2023  Medication: gabapentin (NEURONTIN) 300 MG capsule  Has the patient contacted their pharmacy? Yes (Agent: If no, request that the patient contact the pharmacy for the refill. If patient does not wish to contact the pharmacy document the reason why and proceed with request.) (Agent: If yes, when and what did the pharmacy advise?)  Is this the correct pharmacy for this prescription? Yes If no, delete pharmacy and type the correct one.  This is the patient's preferred pharmacy:  [id number] - [place], [place] - [address] AT [facility] [address] [place] [address] Phone: [phone] Fax: [phone]     Has the prescription been filled recently? Yes  Is the patient out of the medication? Yes  Has the patient been seen for an appointment in the last year OR does the patient have an upcoming appointment? Yes  Can we respond through MyChart? Yes  Agent: Please be advised that Rx refills may take up to 3 business days. We ask that you follow-up with your pharmacy.

## 2023-07-05 NOTE — Telephone Encounter (Signed)
 Please advise  I thought we decreased the dosing on this med  because of side effects  ? Only at night please contact her  about how  she is taking this med  before refilling

## 2023-07-06 ENCOUNTER — Encounter (HOSPITAL_COMMUNITY): Payer: Self-pay

## 2023-07-06 ENCOUNTER — Observation Stay (HOSPITAL_COMMUNITY)
Admission: EM | Admit: 2023-07-06 | Discharge: 2023-07-08 | Disposition: A | Discharge status: Home / Self Care | Attending: Internal Medicine | Admitting: Internal Medicine

## 2023-07-06 ENCOUNTER — Emergency Department (HOSPITAL_COMMUNITY)

## 2023-07-06 DIAGNOSIS — J101 Influenza due to other identified influenza virus with other respiratory manifestations: Principal | ICD-10-CM

## 2023-07-06 DIAGNOSIS — R5381 Other malaise: Secondary | ICD-10-CM | POA: Diagnosis not present

## 2023-07-06 DIAGNOSIS — R531 Weakness: Secondary | ICD-10-CM

## 2023-07-06 DIAGNOSIS — R110 Nausea: Secondary | ICD-10-CM | POA: Insufficient documentation

## 2023-07-06 DIAGNOSIS — E785 Hyperlipidemia, unspecified: Secondary | ICD-10-CM | POA: Diagnosis not present

## 2023-07-06 DIAGNOSIS — F0280 Dementia in other diseases classified elsewhere without behavioral disturbance: Secondary | ICD-10-CM | POA: Diagnosis not present

## 2023-07-06 DIAGNOSIS — M069 Rheumatoid arthritis, unspecified: Secondary | ICD-10-CM | POA: Insufficient documentation

## 2023-07-06 DIAGNOSIS — M6281 Muscle weakness (generalized): Secondary | ICD-10-CM | POA: Insufficient documentation

## 2023-07-06 DIAGNOSIS — R112 Nausea with vomiting, unspecified: Secondary | ICD-10-CM | POA: Diagnosis not present

## 2023-07-06 DIAGNOSIS — R0602 Shortness of breath: Secondary | ICD-10-CM | POA: Insufficient documentation

## 2023-07-06 DIAGNOSIS — G629 Polyneuropathy, unspecified: Secondary | ICD-10-CM | POA: Diagnosis not present

## 2023-07-06 DIAGNOSIS — R059 Cough, unspecified: Secondary | ICD-10-CM | POA: Diagnosis not present

## 2023-07-06 DIAGNOSIS — R0789 Other chest pain: Secondary | ICD-10-CM

## 2023-07-06 DIAGNOSIS — R2689 Other abnormalities of gait and mobility: Secondary | ICD-10-CM | POA: Diagnosis not present

## 2023-07-06 DIAGNOSIS — E871 Hypo-osmolality and hyponatremia: Secondary | ICD-10-CM | POA: Diagnosis not present

## 2023-07-06 DIAGNOSIS — R079 Chest pain, unspecified: Secondary | ICD-10-CM | POA: Insufficient documentation

## 2023-07-06 DIAGNOSIS — Z87891 Personal history of nicotine dependence: Secondary | ICD-10-CM | POA: Diagnosis not present

## 2023-07-06 DIAGNOSIS — I10 Essential (primary) hypertension: Secondary | ICD-10-CM | POA: Diagnosis not present

## 2023-07-06 DIAGNOSIS — F0390 Unspecified dementia without behavioral disturbance: Secondary | ICD-10-CM | POA: Diagnosis present

## 2023-07-06 DIAGNOSIS — G309 Alzheimer's disease, unspecified: Secondary | ICD-10-CM | POA: Diagnosis not present

## 2023-07-06 DIAGNOSIS — M1990 Unspecified osteoarthritis, unspecified site: Secondary | ICD-10-CM | POA: Diagnosis present

## 2023-07-06 DIAGNOSIS — Z79899 Other long term (current) drug therapy: Secondary | ICD-10-CM | POA: Insufficient documentation

## 2023-07-06 LAB — COMPREHENSIVE METABOLIC PANEL
ALT: 14 U/L (ref 0–44)
AST: 27 U/L (ref 15–41)
Albumin: 3.4 g/dL — ABNORMAL LOW (ref 3.5–5.0)
Alkaline Phosphatase: 61 U/L (ref 38–126)
Anion gap: 9 (ref 5–15)
BUN: 17 mg/dL (ref 8–23)
CO2: 21 mmol/L — ABNORMAL LOW (ref 22–32)
Calcium: 8.5 mg/dL — ABNORMAL LOW (ref 8.9–10.3)
Chloride: 102 mmol/L (ref 98–111)
Creatinine, Ser: 0.69 mg/dL (ref 0.44–1.00)
GFR, Estimated: >60 mL/min (ref >60)
Glucose, Bld: 94 mg/dL (ref 70–99)
Potassium: 3.8 mmol/L (ref 3.5–5.1)
Sodium: 132 mmol/L — ABNORMAL LOW (ref 135–145)
Total Bilirubin: 0.6 mg/dL (ref 0.0–1.2)
Total Protein: 6.6 g/dL (ref 6.5–8.1)

## 2023-07-06 LAB — CBC WITH DIFFERENTIAL/PLATELET
Abs Immature Granulocytes: 0.01 10*3/uL (ref 0.00–0.07)
Basophils Absolute: 0 10*3/uL (ref 0.0–0.1)
Basophils Relative: 0 %
Eosinophils Absolute: 0 10*3/uL (ref 0.0–0.5)
Eosinophils Relative: 1 %
HCT: 37.6 % (ref 36.0–46.0)
Hemoglobin: 12.7 g/dL (ref 12.0–15.0)
Immature Granulocytes: 0 %
Lymphocytes Relative: 41 %
Lymphs Abs: 1.6 10*3/uL (ref 0.7–4.0)
MCH: 30.6 pg (ref 26.0–34.0)
MCHC: 33.8 g/dL (ref 30.0–36.0)
MCV: 90.6 fL (ref 80.0–100.0)
Monocytes Absolute: 0.3 10*3/uL (ref 0.1–1.0)
Monocytes Relative: 8 %
Neutro Abs: 1.9 10*3/uL (ref 1.7–7.7)
Neutrophils Relative %: 50 %
Platelets: 254 10*3/uL (ref 150–400)
RBC: 4.15 MIL/uL (ref 3.87–5.11)
RDW: 12.2 % (ref 11.5–15.5)
WBC: 3.9 10*3/uL — ABNORMAL LOW (ref 4.0–10.5)
nRBC: 0 % (ref 0.0–0.2)

## 2023-07-06 LAB — RESP PANEL BY RT-PCR (RSV, FLU A&B, COVID)  RVPGX2
Influenza A by PCR: POSITIVE — AB
Influenza B by PCR: NEGATIVE
Resp Syncytial Virus by PCR: NEGATIVE
SARS Coronavirus 2 by RT PCR: NEGATIVE

## 2023-07-06 LAB — BRAIN NATRIURETIC PEPTIDE: B Natriuretic Peptide: 66 pg/mL (ref 0.0–100.0)

## 2023-07-06 LAB — TROPONIN I (HIGH SENSITIVITY)
Troponin I (High Sensitivity): 8 ng/L (ref <18)
Troponin I (High Sensitivity): 6 ng/L (ref <18)

## 2023-07-06 LAB — LIPASE, BLOOD: Lipase: 50 U/L (ref 11–51)

## 2023-07-06 LAB — TSH: TSH: 0.644 u[IU]/mL (ref 0.350–4.500)

## 2023-07-06 MED ORDER — ADULT MULTIVITAMIN W/MINERALS CH
1.0000 | ORAL_TABLET | Freq: Every day | ORAL | Status: DC
  Administered 2023-07-07: 1 via ORAL
  Filled 2023-07-06: qty 1

## 2023-07-06 MED ORDER — BENZONATATE 100 MG PO CAPS
100.0000 mg | ORAL_CAPSULE | Freq: Two times a day (BID) | ORAL | Status: DC | PRN
  Administered 2023-07-06: 100 mg via ORAL
  Filled 2023-07-06: qty 1

## 2023-07-06 MED ORDER — GABAPENTIN 300 MG PO CAPS
300.0000 mg | ORAL_CAPSULE | Freq: Every day | ORAL | Status: DC
  Administered 2023-07-06 – 2023-07-07 (×2): 300 mg via ORAL
  Filled 2023-07-06 (×2): qty 1

## 2023-07-06 MED ORDER — ENOXAPARIN SODIUM 40 MG/0.4ML IJ SOSY
40.0000 mg | PREFILLED_SYRINGE | INTRAMUSCULAR | Status: DC
  Administered 2023-07-06 – 2023-07-07 (×2): 40 mg via SUBCUTANEOUS
  Filled 2023-07-06 (×2): qty 0.4

## 2023-07-06 MED ORDER — SIMVASTATIN 10 MG PO TABS
10.0000 mg | ORAL_TABLET | Freq: Every evening | ORAL | Status: DC
  Administered 2023-07-06 – 2023-07-07 (×2): 10 mg via ORAL
  Filled 2023-07-06 (×2): qty 1

## 2023-07-06 MED ORDER — VITAMIN C 500 MG PO TABS
1000.0000 mg | ORAL_TABLET | Freq: Every day | ORAL | Status: DC
  Administered 2023-07-06 – 2023-07-08 (×3): 1000 mg via ORAL
  Filled 2023-07-06 (×3): qty 2

## 2023-07-06 MED ORDER — ALBUTEROL SULFATE (2.5 MG/3ML) 0.083% IN NEBU: 2.5000 mg | INHALATION_SOLUTION | RESPIRATORY_TRACT | Status: DC | PRN

## 2023-07-06 MED ORDER — ONDANSETRON HCL 4 MG/2ML IJ SOLN: 4.0000 mg | Freq: Four times a day (QID) | INTRAMUSCULAR | Status: DC | PRN

## 2023-07-06 MED ORDER — GABAPENTIN 300 MG PO CAPS: 300.0000 mg | ORAL_CAPSULE | Freq: Four times a day (QID) | ORAL | Status: DC

## 2023-07-06 MED ORDER — ACETAMINOPHEN 325 MG PO TABS
650.0000 mg | ORAL_TABLET | ORAL | Status: DC | PRN
  Administered 2023-07-07: 650 mg via ORAL
  Filled 2023-07-06: qty 2

## 2023-07-06 MED ORDER — ACETAMINOPHEN 325 MG PO TABS
650.0000 mg | ORAL_TABLET | Freq: Once | ORAL | Status: AC
Start: 2023-07-06 — End: 2023-07-06
  Administered 2023-07-06: 650 mg via ORAL
  Filled 2023-07-06: qty 2

## 2023-07-06 MED ORDER — APOAEQUORIN 10 MG PO CAPS: 10.0000 mg | ORAL_CAPSULE | Freq: Every day | ORAL | Status: DC

## 2023-07-06 MED ORDER — ONDANSETRON HCL 4 MG PO TABS: 4.0000 mg | ORAL_TABLET | Freq: Three times a day (TID) | ORAL | Status: DC | PRN

## 2023-07-06 MED ORDER — ONDANSETRON HCL 4 MG PO TABS: 4.0000 mg | ORAL_TABLET | Freq: Four times a day (QID) | ORAL | Status: DC | PRN

## 2023-07-06 MED ORDER — SENNA 8.6 MG PO TABS
2.0000 | ORAL_TABLET | ORAL | Status: DC
  Administered 2023-07-08: 17.2 mg via ORAL
  Filled 2023-07-06 (×2): qty 2

## 2023-07-06 MED ORDER — ALBUTEROL SULFATE (2.5 MG/3ML) 0.083% IN NEBU
5.0000 mg | INHALATION_SOLUTION | Freq: Once | RESPIRATORY_TRACT | Status: AC
  Administered 2023-07-06: 5 mg via RESPIRATORY_TRACT
  Filled 2023-07-06: qty 6

## 2023-07-06 MED ORDER — ONDANSETRON HCL 4 MG/2ML IJ SOLN
4.0000 mg | Freq: Once | INTRAMUSCULAR | Status: AC
  Administered 2023-07-06: 4 mg via INTRAVENOUS
  Filled 2023-07-06: qty 2

## 2023-07-06 MED ORDER — TRAZODONE HCL 50 MG PO TABS
25.0000 mg | ORAL_TABLET | Freq: Every evening | ORAL | Status: DC | PRN
  Administered 2023-07-07: 25 mg via ORAL
  Filled 2023-07-06: qty 1

## 2023-07-06 NOTE — Telephone Encounter (Signed)
 No success call on pt's home phone.   Reach out mobile number. Son, Mr. Susan Ford answered. He advise to contact [facility] and inform pt had went to hospital today for stomach pain. Will contact [facility].

## 2023-07-06 NOTE — ED Provider Notes (Signed)
  Physical Exam  BP (!) 158/99   Pulse 85   Temp 98.8 F (37.1 C) (Rectal)   Resp 15   SpO2 95%   Physical Exam  Procedures  Procedures  ED Course / MDM   Clinical Course as of 07/06/23 1550  Wed Jul 06, 2023  1120 EKG with normal sinus rhythm, labs with improved hyponatremia. She is still flu A positive. CXR read is pending though no obvious abnormality on my read. No signs of sepsis on labs or vitals. [VK]  1215 CXR without acute disease. Suspect patient has ongoing symptoms related to her flu illness.  [VK]  1238 Upon reassessment, patient reports she is too weak to walk. Reports having a headache that just started, not sudden onset, no associated N/V, numbness/weakness or neck pain/stiffness. Patient reports she lives at assisted living and is too weak to care for herself with current level of care. Will consult TOC/PT. [VK]  1413 Patient was evaluated by PT, sat at edge of bed and started to complain of chest pain and PT assessment could not be completed. Will repeat EKG and order troponin for medical clearance. [VK]    Clinical Course User Index [VK] Rexford Maus, DO   Medical Decision Making Care assumed at 3 pm. Patient is here with weakness.  Patient was diagnosed with the flu about a week ago.  Patient just finished a course of Augmentin for possible pneumonia.  Patient is here with weakness.  While physical therapy evaluate the patient, she developed chest pain.  Signout pending second troponin and follow-up with social work  3:53 PM I reviewed patient's labs and second troponin is negative.  However patient is persistently weak and influenza test is positive.  Patient also has bibasilar crackles.  At this point, I think patient will need to be admitted for weakness likely from influenza and possible bronchitis and observation for chest pain.  Problems Addressed: Chest pain, unspecified type: acute illness or injury Generalized weakness: acute illness or  injury Influenza A: acute illness or injury Nausea: acute illness or injury  Amount and/or Complexity of Data Reviewed Labs: ordered. Decision-making details documented in ED Course. Radiology: ordered and independent interpretation performed. Decision-making details documented in ED Course.  Risk OTC drugs. Prescription drug management. Decision regarding hospitalization.          Charlynne Pander, MD 07/06/23 [phone]

## 2023-07-06 NOTE — Evaluation (Signed)
 Physical Therapy Evaluation Patient Details Name: Susan Ford MRN: [id number] DOB: 1929-06-15 Today's Date: 07/06/2023  History of Present Illness  Susan Ford is a 89 y.o. female presents with nausea and cough; positive for influenza A PMH: PE, GERD, skin cancer, dementia, peripheral neuropathy  Clinical Impression  Pt admitted with above diagnosis. Pt reports living at [facility], reports using RW for ambulation, reports ind with self care but also states staff assists if needed. Pt appears distracted, recounting pressing call button multiple times today; pt aware she is in the hospital, correctly reports month and year and recent birthday. Pt mobilizing BLE freely in the bed, needing min A to come to sitting EOB. Pt reports dizziness and chest pain once seated EOB, declines further mobility, min A to scoot up and reposition in supine. Patient will benefit from continued inpatient follow up therapy, <3 hours/day vs HHPT pending improvement during hospitalization. Pt currently with functional limitations due to the deficits listed below (see PT Problem List). Pt will benefit from acute skilled PT to increase their independence and safety with mobility to allow discharge.           If plan is discharge home, recommend the following: A lot of help with walking and/or transfers;A lot of help with bathing/dressing/bathroom;Assistance with cooking/housework;Direct supervision/assist for medications management;Assist for transportation   Can travel by private vehicle   No    Equipment Recommendations None recommended by PT  Recommendations for Other Services       Functional Status Assessment Patient has had a recent decline in their functional status and demonstrates the ability to make significant improvements in function in a reasonable and predictable amount of time.     Precautions / Restrictions Precautions Precautions: Fall Restrictions Weight Bearing Restrictions  Per Provider Order: No      Mobility  Bed Mobility Overal bed mobility: Needs Assistance Bed Mobility: Supine to Sit, Sit to Supine     Supine to sit: Min assist Sit to supine: Min assist   General bed mobility comments: pt pulls up on therapist's hands to upright trunk into sitting, min A to steady once seated EOB, pt reports chest pain and dizziness requesting to return to supine, min A to scoot up in bed and reposition    Transfers                   General transfer comment: unable due to chest pain    Ambulation/Gait                  Stairs            Wheelchair Mobility     Tilt Bed    Modified Rankin (Stroke Patients Only)       Balance Overall balance assessment: Needs assistance   Sitting balance-Leahy Scale: Poor Sitting balance - Comments: min A, feet off on ground while seated on elevated gurney                                     Pertinent Vitals/Pain Pain Assessment Pain Assessment: Faces Faces Pain Scale: Hurts whole lot Pain Location: chest pain Pain Descriptors / Indicators: Discomfort Pain Intervention(s): Limited activity within patient's tolerance, Monitored during session    Home Living Family/patient expects to be discharged to:: Assisted living ([facility])                 Home  Equipment: Agricultural consultant (2 wheels);Cane - single point      Prior Function               Mobility Comments: pt reports uses RW for amb in facility ADLs Comments: pt reports ind with self care but also states facility assists as needed     Extremity/Trunk Assessment   Upper Extremity Assessment Upper Extremity Assessment: Defer to OT evaluation    Lower Extremity Assessment Lower Extremity Assessment: Generalized weakness (AROM WFL, strength grossly 3+/5)    Cervical / Trunk Assessment Cervical / Trunk Assessment: Kyphotic  Communication   Communication Communication: No apparent difficulties     Cognition Arousal: Alert Behavior During Therapy: Restless   PT - Cognitive impairments: History of cognitive impairments                       PT - Cognition Comments: pt appears distracted, needing redirection to task, recounting each time she pressed the call button today         Cueing Cueing Techniques: Verbal cues, Gestural cues, Tactile cues     General Comments      Exercises     Assessment/Plan    PT Assessment Patient needs continued PT services  PT Problem List Decreased strength;Decreased activity tolerance;Decreased balance;Decreased mobility;Decreased knowledge of use of DME;Decreased safety awareness;Decreased knowledge of precautions;Pain       PT Treatment Interventions DME instruction;Gait training;Functional mobility training;Therapeutic activities;Therapeutic exercise;Balance training;Patient/family education    PT Goals (Current goals can be found in the Care Plan section)  Acute Rehab PT Goals Patient Stated Goal: "my chest hurts" PT Goal Formulation: With patient Time For Goal Achievement: 07/20/23 Potential to Achieve Goals: Good    Frequency Min 2X/week     Co-evaluation               AM-PAC PT "6 Clicks" Mobility  Outcome Measure Help needed turning from your back to your side while in a flat bed without using bedrails?: A Little Help needed moving from lying on your back to sitting on the side of a flat bed without using bedrails?: A Little Help needed moving to and from a bed to a chair (including a wheelchair)?: A Little Help needed standing up from a chair using your arms (e.g., wheelchair or bedside chair)?: A Little Help needed to walk in hospital room?: A Lot Help needed climbing 3-5 steps with a railing? : Total 6 Click Score: 15    End of Session   Activity Tolerance: Patient tolerated treatment well;Patient limited by pain Patient left: in bed;with call bell/phone within reach Nurse Communication: Mobility  status;Other (comment) (chest pain) PT Visit Diagnosis: Other abnormalities of gait and mobility (R26.89);Muscle weakness (generalized) (M62.81);Pain Pain - part of body:  (chest)    Time: 1350-1401 PT Time Calculation (min) (ACUTE ONLY): 11 min   Charges:   PT Evaluation $PT Eval Low Complexity: 1 Low   PT General Charges $$ ACUTE PT VISIT: 1 Visit         Tori Rynn Markiewicz PT, DPT 07/06/23, 2:19 PM

## 2023-07-06 NOTE — ED Notes (Signed)
 Rectal Temp preformed with Shaniah, NT because pt felt hot to touch. RN made aware and notified.

## 2023-07-06 NOTE — ED Triage Notes (Signed)
 Pt BIBA from [facility] for nausea since 4am today.  Vital signs within normal range.   Hx of Alzheimer/Dementia.

## 2023-07-06 NOTE — H&P (Signed)
 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  She was evaluated in the emergency department 6 days ago on 06/30/2023, at the time she tested positive for influenza A.  Symptoms have been going on with cough and fever for about 2 weeks so was not started on Tamiflu.  She had normal vital signs, saturating well on room air.  Apparently she was later treated empirically by her PCP with a course of Augmentin.  She returns today from her assisted living habits with with continued cough and weakness.  Once again workup is relatively unremarkable, flu swab is positive.  She is stable on room air.  However during PT evaluation here in the emergency department she was very weak, and started to complain of some chest pain.  Initial troponin negative.  Due to her advanced age and weakness, observation admission to the hospitalist service was requested.  Review of Systems: Please see HPI for pertinent positives and negatives. A complete 10 system review of systems could not be reliably performed due to the patient's dementia.  Able to respond to direct specific questions, but unable to tell me exactly why she is in the hospital.  Past Medical History:  Diagnosis Date   Arthritis    Cataract 2007   bilateral cat ext    Cholecystitis    Complication of anesthesia Feb 21, 2014   atrial fib after cholecystectomy, lasted a few hours, none since   Dysrhythmia 02-21-2014   after cholecystectomy had atrial fib for a few hours, none since   GERD (gastroesophageal reflux disease)    Had endoscopy no history of Barrett's   Hx of nonmelanoma skin cancer     Unspecified hereditary and idiopathic peripheral neuropathy    Past Surgical History:  Procedure Laterality Date   ABDOMINAL HYSTERECTOMY  15 yrs ago   complete   BIOPSY  07/09/2022   Procedure: BIOPSY;  Surgeon: Kathi Der, MD;  Location: [facility] ENDOSCOPY;  Service: Gastroenterology;;   BIOPSY  04/13/2023   Procedure: BIOPSY;  Surgeon: Vida Rigger, MD;  Location: [facility];  Service: Gastroenterology;;   BREAST CYST ASPIRATION     CHOLECYSTECTOMY N/A 02/21/2014   Procedure: LAPAROSCOPIC CHOLECYSTECTOMY WITH INTRAOPERATIVE CHOLANGIOGRAM;  Surgeon: Frederik Schmidt, MD;  Location: [facility] OR;  Service: General;  Laterality: N/A;   ENDOSCOPIC RETROGRADE CHOLANGIOPANCREATOGRAPHY (ERCP) WITH PROPOFOL N/A 05/29/2014   Procedure: ENDOSCOPIC RETROGRADE CHOLANGIOPANCREATOGRAPHY (ERCP) WITH PROPOFOL;  Surgeon: Willis Modena, MD;  Location: [facility] ENDOSCOPY;  Service: Endoscopy;  Laterality: N/A;   ERCP N/A 02/24/2014   Procedure: ENDOSCOPIC RETROGRADE CHOLANGIOPANCREATOGRAPHY (ERCP);  Surgeon: Willis Modena, MD;  Location: [facility] ENDOSCOPY;  Service: Endoscopy;  Laterality: N/A;   ESOPHAGOGASTRODUODENOSCOPY (EGD) WITH PROPOFOL N/A 07/09/2022   Procedure: ESOPHAGOGASTRODUODENOSCOPY (EGD) WITH PROPOFOL;  Surgeon: Kathi Der, MD;  Location: [facility] ENDOSCOPY;  Service: Gastroenterology;  Laterality: N/A;   ESOPHAGOGASTRODUODENOSCOPY (EGD) WITH PROPOFOL N/A 04/13/2023   Procedure: ESOPHAGOGASTRODUODENOSCOPY (EGD) WITH PROPOFOL;  Surgeon: Vida Rigger, MD;  Location: [facility] ENDOSCOPY;  Service: Gastroenterology;  Laterality: N/A;   EYE SURGERY Bilateral 2007   both eyes cataracts with lens replacments   LUMBAR LAMINECTOMY/DECOMPRESSION MICRODISCECTOMY  02/01/2012   Procedure: LUMBAR LAMINECTOMY/DECOMPRESSION MICRODISCECTOMY 2 LEVELS;  Surgeon: Barnett Abu,  MD;  Location: [facility] NEURO ORS;  Service: Neurosurgery;  Laterality: Left;  Left Lumbar Four-Five Lumbar Five-Sacral One Laminectomies/Foraminotomies/Microscope   SPYGLASS  CHOLANGIOSCOPY N/A 05/29/2014   Procedure: SPYGLASS CHOLANGIOSCOPY;  Surgeon: Willis Modena, MD;  Location: [facility] ENDOSCOPY;  Service: Endoscopy;  Laterality: N/A;   TONSILLECTOMY  age 10   Social History:  reports that she quit smoking about 60 years ago. Her smoking use included cigarettes. She started smoking about 67 years ago. She has a 1.8 pack-year smoking history. She has never used smokeless tobacco. She reports current alcohol use of about 7.0 standard drinks of alcohol per week. She reports that she does not use drugs.  Allergies  Allergen Reactions   Keflex [Cephalexin]     Dizziness   Lyrica [Pregabalin] Other (See Comments)    "High Fever"    Oxytetracycline Other (See Comments)    "High Fever" caused by terramycin    Memantine Rash    Family History  Problem Relation Age of Onset   Neurodegenerative disease Mother        Shy Drager died at 50   Alcohol abuse Other    Heart disease Other      Prior to Admission medications   Medication Sig Start Date End Date Taking? Authorizing Provider  acetaminophen (TYLENOL) 325 MG tablet Take 650 mg by mouth every 6 (six) hours as needed for moderate pain (pain score 4-6) or mild pain (pain score 1-3).   Yes [provider]  Apoaequorin (PREVAGEN) 10 MG CAPS Take 10 mg by mouth daily.   Yes [provider]  Ascorbic Acid (VITAMIN C) 1000 MG tablet Take 1,000 mg by mouth daily.   Yes [provider]  gabapentin (NEURONTIN) 300 MG capsule Take 1 capsule (300 mg total) by mouth 4 (four) times daily. 11/03/22  Yes Panosh, Neta Mends, MD  Multiple Vitamin (MULTIVITAMIN) tablet Take 1 tablet by mouth daily after lunch.   Yes [provider]  senna (SENOKOT) 8.6 MG TABS tablet Take 1 tablet (8.6 mg total) by mouth daily. Patient taking differently: Take 2 tablets by mouth every other day. 01/02/23  Yes Kc, Dayna Barker, MD  simvastatin (ZOCOR) 10 MG tablet TAKE 1 TABLET BY MOUTH EVERY DAY Patient taking  differently: Take 10 mg by mouth daily. 06/27/23  Yes Panosh, Neta Mends, MD    Physical Exam: BP (!) 158/99   Pulse 85   Temp 98.8 F (37.1 C) (Rectal)   Resp 15   SpO2 95%  General:  Alert, oriented to self and place, calm, in no acute distress  Eyes: EOMI, clear conjuctivae, white sclerea Neck: supple, no masses, trachea mildline  Cardiovascular: RRR, no murmurs or rubs, no peripheral edema, right anterior chest is tender to palpation, with no overlying skin changes, no palpable masses Respiratory: clear to auscultation bilaterally, no wheezes, no crackles  Abdomen: soft, nontender, nondistended, normal bowel tones heard  Skin: dry, no rashes  Musculoskeletal: no joint effusions, normal range of motion  Psychiatric: appropriate affect, normal speech  Neurologic: extraocular muscles intact, clear speech, moving all extremities with intact sensorium         Labs on Admission:  Basic Metabolic Panel: Recent Labs  Lab 06/30/23 1548 07/06/23 0941  NA 129* 132*  K 3.9 3.8  CL 98 102  CO2 22 21*  GLUCOSE 109* 94  BUN 10 17  CREATININE 0.71 0.69  CALCIUM 8.5* 8.5*   Liver Function Tests: Recent Labs  Lab 06/30/23 1548 07/06/23 0941  AST  25 27  ALT 14 14  ALKPHOS 66 61  BILITOT 0.5 0.6  PROT 6.8 6.6  ALBUMIN 3.3* 3.4*   Recent Labs  Lab 07/06/23 0941  LIPASE 50   No results for input(s): "AMMONIA" in the last 168 hours. CBC: Recent Labs  Lab 06/30/23 1548 07/06/23 0941  WBC 5.0 3.9*  NEUTROABS 3.0 1.9  HGB 12.8 12.7  HCT 39.4 37.6  MCV 92.9 90.6  PLT 316 254   Cardiac Enzymes: No results for input(s): "CKTOTAL", "CKMB", "CKMBINDEX", "TROPONINI" in the last 168 hours. BNP (last 3 results) No results for input(s): "BNP" in the last 8760 hours.  ProBNP (last 3 results) Recent Labs    05/11/23 1527  PROBNP 61.0    CBG: No results for input(s): "GLUCAP" in the last 168 hours.  Radiological Exams on Admission: DG Chest Port 1 View Result Date:  07/06/2023 CLINICAL DATA:  Cough.  Shortness of breath. EXAM: PORTABLE CHEST 1 VIEW COMPARISON:  06/30/2023. FINDINGS: Bilateral lung fields are clear. Stable 5 mm calcified granuloma overlying the right mid lung zone. Bilateral costophrenic angles are clear. Normal cardio-mediastinal silhouette. No acute osseous abnormalities. The soft tissues are within normal limits. IMPRESSION: No active disease. Electronically Signed   By: Jules Schick M.D.   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  Generalized weakness-no clear evidence of acute infection, heart failure etc.  Patient is influenza A positive, but asymptomatic from a pulmonary perspective. -Observation admission -Repeat PT evaluation in the morning -TOC has been consulted, she may benefit from nursing facility placement -Will check a BNP due to ER provider report of crackles, though her lungs sound pretty clear to me  Chest discomfort-I suspect this is musculoskeletal -Telemetry -Trend troponin  Hyponatremia-mild and inconsequential  Neuropathy-continue gabapentin, reduced to just daily at bedtime per recent PCP documentation  Hyperlipidemia-Zocor every evening  DVT prophylaxis: Lovenox     Code Status: Full Code  Consults called: None  Admission status: Observation  Time spent: 48 minutes  Epifanio Labrador Sharlette Dense MD [facility] Pager [phone]  If 7PM-7AM, please contact night-coverage [email or web address] Password [facility]  07/06/2023, 4:17 PM

## 2023-07-06 NOTE — ED Provider Notes (Signed)
 [place] EMERGENCY DEPARTMENT AT [facility] Provider Note   CSN: [id number] Arrival date & time: 07/06/23  5621     History  Chief Complaint  Patient presents with  . Nausea    Susan Ford is a 89 y.o. female.  Patient is a 89 year old female with a past medical history of dementia, GERD presenting to the emergency department with nausea and cough.  Patient reports that she has been feeling unwell for the last few days.  She states that she has bodyaches and has had a productive cough.  She denies any fever or chills.  She denies any chest pain but states she has had some mild shortness of breath.  She denies any vomiting or diarrhea or associated abdominal pain.  She denies any dysuria or hematuria. Of note, she recently tested positive for the Flu on 3/6 and was on Augmentin for presumed pneumonia on 2/28.  The history is provided by the patient and medical records.       Home Medications Prior to Admission medications   Medication Sig Start Date End Date Taking? Authorizing Provider  acetaminophen (TYLENOL) 325 MG tablet Take 650 mg by mouth every 6 (six) hours as needed for moderate pain (pain score 4-6) or mild pain (pain score 1-3).    [provider]  amoxicillin-clavulanate (AUGMENTIN) 875-125 MG tablet Take 1 tablet by mouth every 12 (twelve) hours. 06/24/23   Garrison, [place] N, FNP  Apoaequorin (PREVAGEN) 10 MG CAPS Take 10 mg by mouth daily.    [provider]  Ascorbic Acid (VITAMIN C) 1000 MG tablet Take 1,000 mg by mouth daily.    [provider]  azithromycin (ZITHROMAX) 250 MG tablet Take 1 tablet (250 mg total) by mouth daily. Take first 2 tablets together, then 1 every day until finished. 06/24/23   Garrison, [place] N, FNP  gabapentin (NEURONTIN) 300 MG capsule Take 1 capsule (300 mg total) by mouth 4 (four) times daily. 11/03/22   Panosh, Neta Mends, MD  hydrocortisone cream 1 % Apply topically 2 (two) times daily as  needed for itching. Patient not taking: Reported on 04/10/2023 01/01/23   Lanae Boast, MD  loratadine (ALLERGY RELIEF) 10 MG tablet Take 10 mg by mouth every evening. Patient not taking: Reported on 04/10/2023    [provider]  Multiple Vitamin (MULTIVITAMIN) tablet Take 1 tablet by mouth daily after lunch.    [provider]  ondansetron (ZOFRAN-ODT) 4 MG disintegrating tablet Take 1 tablet (4 mg total) by mouth every 8 (eight) hours as needed. Patient not taking: Reported on 04/12/2023 04/10/23   Jacalyn Lefevre, MD  pantoprazole (PROTONIX) 40 MG tablet Take 1 tablet (40 mg total) by mouth daily. Patient not taking: Reported on 05/11/2023 04/15/23 04/09/24  Meredeth Ide, MD  polyethylene glycol (MIRALAX / GLYCOLAX) 17 g packet Take 17 g by mouth daily. Patient taking differently: Take 17 g by mouth as needed for moderate constipation or mild constipation. 01/02/23   Lanae Boast, MD  senna (SENOKOT) 8.6 MG TABS tablet Take 1 tablet (8.6 mg total) by mouth daily. Patient taking differently: Take 2 tablets by mouth every other day. 01/02/23   Lanae Boast, MD  simvastatin (ZOCOR) 10 MG tablet TAKE 1 TABLET BY MOUTH EVERY DAY 06/27/23   Panosh, Neta Mends, MD      Allergies    Keflex [cephalexin], Lyrica [pregabalin], Oxytetracycline, and Memantine    Review of Systems   Review of Systems  Physical Exam Updated  Vital Signs BP (!) 158/99   Pulse 85   Temp 98.8 F (37.1 C) (Rectal)   Resp 15   SpO2 95%  Physical Exam Vitals and nursing note reviewed.  Constitutional:      General: She is not in acute distress.    Appearance: Normal appearance.  HENT:     Head: Normocephalic and atraumatic.     Nose: Congestion present.     Mouth/Throat:     Mouth: Mucous membranes are moist.     Pharynx: Oropharynx is clear.  Eyes:     Extraocular Movements: Extraocular movements intact.     Conjunctiva/sclera: Conjunctivae normal.  Cardiovascular:     Rate and Rhythm: Normal rate and  regular rhythm.     Heart sounds: Normal heart sounds.  Pulmonary:     Effort: Pulmonary effort is normal.     Breath sounds: Normal breath sounds.  Abdominal:     General: Abdomen is flat.     Palpations: Abdomen is soft.     Tenderness: There is no abdominal tenderness.  Musculoskeletal:        General: Normal range of motion.     Cervical back: Normal range of motion.     Right lower leg: No edema.     Left lower leg: No edema.  Skin:    General: Skin is warm and dry.  Neurological:     General: No focal deficit present.     Mental Status: She is alert and oriented to person, place, and time.  Psychiatric:        Mood and Affect: Mood normal.        Behavior: Behavior normal.     ED Results / Procedures / Treatments   Labs (all labs ordered are listed, but only abnormal results are displayed) Labs Reviewed  RESP PANEL BY RT-PCR (RSV, FLU A&B, COVID)  RVPGX2 - Abnormal; Notable for the following components:      Result Value   Influenza A by PCR POSITIVE (*)    All other components within normal limits  COMPREHENSIVE METABOLIC PANEL - Abnormal; Notable for the following components:   Sodium 132 (*)    CO2 21 (*)    Calcium 8.5 (*)    Albumin 3.4 (*)    All other components within normal limits  CBC WITH DIFFERENTIAL/PLATELET - Abnormal; Notable for the following components:   WBC 3.9 (*)    All other components within normal limits  LIPASE, BLOOD    EKG EKG Interpretation Date/Time:  Wednesday July 06 2023 12:01:26 EDT Ventricular Rate:  86 PR Interval:  190 QRS Duration:  92 QT Interval:  384 QTC Calculation: 460 R Axis:   -45  Text Interpretation: Sinus rhythm Left axis deviation Low voltage, extremity leads RSR' in V1 or V2, probably normal variant Abnormal inferior Q waves No significant change was found Confirmed by Elayne Snare ([id number]) on 07/06/2023 12:04:08 PM  Radiology DG Chest Port 1 View Result Date: 07/06/2023 CLINICAL DATA:  Cough.   Shortness of breath. EXAM: PORTABLE CHEST 1 VIEW COMPARISON:  06/30/2023. FINDINGS: Bilateral lung fields are clear. Stable 5 mm calcified granuloma overlying the right mid lung zone. Bilateral costophrenic angles are clear. Normal cardio-mediastinal silhouette. No acute osseous abnormalities. The soft tissues are within normal limits. IMPRESSION: No active disease. Electronically Signed   By: Jules Schick M.D.   On: 07/06/2023 12:09    Procedures Procedures    Medications Ordered in ED Medications  acetaminophen (TYLENOL) tablet 650  mg (has no administration in time range)  ondansetron (ZOFRAN) tablet 4 mg (has no administration in time range)  benzonatate (TESSALON) capsule 100 mg (has no administration in time range)  ondansetron (ZOFRAN) injection 4 mg (4 mg Intravenous Given 07/06/23 1000)  acetaminophen (TYLENOL) tablet 650 mg (650 mg Oral Given 07/06/23 1227)    ED Course/ Medical Decision Making/ A&P Clinical Course as of 07/06/23 1413  Wed Jul 06, 2023  1120 EKG with normal sinus rhythm, labs with improved hyponatremia. She is still flu A positive. CXR read is pending though no obvious abnormality on my read. No signs of sepsis on labs or vitals. [VK]  1215 CXR without acute disease. Suspect patient has ongoing symptoms related to her flu illness.  [VK]  1238 Upon reassessment, patient reports she is too weak to walk. Reports having a headache that just started, not sudden onset, no associated N/V, numbness/weakness or neck pain/stiffness. Patient reports she lives at assisted living and is too weak to care for herself with current level of care. Will consult TOC/PT. [VK]  1413 Patient was evaluated by PT, sat at edge of bed and started to complain of chest pain and PT assessment could not be completed. Will repeat EKG and order troponin for medical clearance. [VK]    Clinical Course User Index [VK] Rexford Maus, DO                                 Medical Decision  Making This patient presents to the ED with chief complaint(s) of nausea, cough with pertinent past medical history of dementia, GERD, recent admission for GI bleed which further complicates the presenting complaint. The complaint involves an extensive differential diagnosis and also carries with it a high risk of complications and morbidity.    The differential diagnosis includes dehydration, electrolyte abnormality, atypical ACS, arrhythmia, anemia, pneumonia, pneumothorax, pulmonary edema, pleural effusion, postviral syndrome  Additional history obtained: Additional history obtained from N/A Records reviewed Primary Care Documents and recent ED and urgent care records  ED Course and Reassessment: On patient's arrival she is hemodynamically stable in no acute distress.  Will have labs and repeat viral swab as well as chest x-ray performed to evaluate for cause of her symptoms.  Was given Zofran for symptomatic management and will be closely reassessed.  Independent labs interpretation:  The following labs were independently interpreted: flu A positive, otherwise within normal range  Independent visualization of imaging: - I independently visualized the following imaging with scope of interpretation limited to determining acute life threatening conditions related to emergency care: CXR, which revealed no acute disease  Consultation: - Consulted or discussed management/test interpretation w/ external professional: TOC    Amount and/or Complexity of Data Reviewed Labs: ordered. Radiology: ordered.  Risk OTC drugs. Prescription drug management.          Final Clinical Impression(s) / ED Diagnoses Final diagnoses:  Influenza A  Generalized weakness  Nausea    Rx / DC Orders ED Discharge Orders     None         Rexford Maus, DO 07/06/23 1246

## 2023-07-06 NOTE — ED Notes (Signed)
 Pt complained of being cold and having chest pain repeat EKG preformed and, pt temp rechecked and was 98.4 F, RN notified as well of pt complaints.

## 2023-07-07 DIAGNOSIS — G6289 Other specified polyneuropathies: Secondary | ICD-10-CM

## 2023-07-07 DIAGNOSIS — R5381 Other malaise: Secondary | ICD-10-CM

## 2023-07-07 DIAGNOSIS — R0789 Other chest pain: Secondary | ICD-10-CM | POA: Diagnosis not present

## 2023-07-07 LAB — CBC
HCT: 35.2 % — ABNORMAL LOW (ref 36.0–46.0)
Hemoglobin: 11.6 g/dL — ABNORMAL LOW (ref 12.0–15.0)
MCH: 30.4 pg (ref 26.0–34.0)
MCHC: 33 g/dL (ref 30.0–36.0)
MCV: 92.4 fL (ref 80.0–100.0)
Platelets: 233 10*3/uL (ref 150–400)
RBC: 3.81 MIL/uL — ABNORMAL LOW (ref 3.87–5.11)
RDW: 12.3 % (ref 11.5–15.5)
WBC: 4.6 10*3/uL (ref 4.0–10.5)
nRBC: 0 % (ref 0.0–0.2)

## 2023-07-07 LAB — BASIC METABOLIC PANEL
Anion gap: 9 (ref 5–15)
BUN: 13 mg/dL (ref 8–23)
CO2: 22 mmol/L (ref 22–32)
Calcium: 8.3 mg/dL — ABNORMAL LOW (ref 8.9–10.3)
Chloride: 102 mmol/L (ref 98–111)
Creatinine, Ser: 0.54 mg/dL (ref 0.44–1.00)
GFR, Estimated: >60 mL/min (ref >60)
Glucose, Bld: 95 mg/dL (ref 70–99)
Potassium: 3.8 mmol/L (ref 3.5–5.1)
Sodium: 133 mmol/L — ABNORMAL LOW (ref 135–145)

## 2023-07-07 MED ORDER — BISACODYL 10 MG RE SUPP
10.0000 mg | Freq: Every day | RECTAL | Status: DC | PRN
  Administered 2023-07-07: 10 mg via RECTAL
  Filled 2023-07-07: qty 1

## 2023-07-07 MED ORDER — DEXTROMETHORPHAN POLISTIREX ER 30 MG/5ML PO SUER
30.0000 mg | Freq: Two times a day (BID) | ORAL | Status: DC | PRN
  Filled 2023-07-07: qty 5

## 2023-07-07 NOTE — Assessment & Plan Note (Signed)
 -  continue gabapentin

## 2023-07-07 NOTE — Assessment & Plan Note (Signed)
 -  continue zocor

## 2023-07-07 NOTE — TOC Progression Note (Signed)
 Transition of Care [facility]) - Progression Note    Patient Details  Name: Susan Ford MRN: [id number] Date of Birth: 1929-07-26  [facility] of [facility] CM/SW Contact  Otelia Santee, LCSW Phone Number: 07/07/2023, 3:21 PM  Clinical Narrative:    Per MD pt experiencing some level of confusion. Spoke with pt's son/POA Donnald Garre to discuss recommendation for SNF. [name] agreeable to recommendation and would like facility close to pt's ILF at [facility].  Referrals have been sent out for SNF placement and currently awaiting bed offers. Attempted to meet with pt to discuss however, pt was in the restroom. CSW will attempt to have further conversation about SNF with pt at a later time.    Expected Discharge Plan: Assisted Living Barriers to Discharge: Continued Medical Work up  Expected Discharge Plan and Services   Discharge Planning Services: CM Consult   Living arrangements for the past 2 months: Independent Living Facility                 DME Arranged: N/A DME Agency: [employer] (current with Chiropractor)                   Social Determinants of Health (SDOH) Interventions SDOH Screenings   Food Insecurity: No Food Insecurity (07/06/2023)  Housing: Low Risk  (07/07/2023)  Transportation Needs: No Transportation Needs (07/07/2023)  Utilities: Not At Risk (07/07/2023)  Alcohol Screen: Low Risk  (06/15/2023)  Depression (PHQ2-9): Low Risk  (06/15/2023)  Financial Resource Strain: Low Risk  (06/15/2023)  Physical Activity: Inactive (06/15/2023)  Social Connections: Moderately Integrated (07/06/2023)  Stress: No Stress Concern Present (06/15/2023)  Tobacco Use: Medium Risk (07/06/2023)  Health Literacy: Adequate Health Literacy (06/15/2023)    Readmission Risk Interventions     No data to display

## 2023-07-07 NOTE — Plan of Care (Signed)
  Problem: Clinical Measurements: Goal: Ability to maintain clinical measurements within normal limits will improve Outcome: Progressing Goal: Will remain free from infection Outcome: Progressing Goal: Diagnostic test results will improve Outcome: Progressing Goal: Respiratory complications will improve Outcome: Progressing   Problem: Pain Managment: Goal: General experience of comfort will improve and/or be controlled Outcome: Progressing

## 2023-07-07 NOTE — Telephone Encounter (Signed)
 Attempted to reach [facility]. Got transferred and left a voicemail to call us back.

## 2023-07-07 NOTE — Assessment & Plan Note (Signed)
-   presumed due to MSK pain from flu and cough - negative cardiac workup

## 2023-07-07 NOTE — Assessment & Plan Note (Signed)
-   s/p recent flu diagnosis on 3/6; has had difficulty recovering and now more weak and not maintaining ability to continue at ALF; has been recommended for SNF per PT - optimistic that SNF would be short term if deconditioning solely due to recent viral infection  - family and patient to discuss dispo plans after rec's - continue with PT

## 2023-07-07 NOTE — Hospital Course (Signed)
 Ms. Creer is a 89 yo female with PMH arthritis, GERD, cataracts, neuropathy, Alzheimer's dementia who presented with worsening weakness and cough. She was initially diagnosed with Flu on 3/6 and has had a slow recovery.  She was evaluated by PT after admission and also recommended for SNF due to the decline and slow recovery.

## 2023-07-07 NOTE — [place] FL2 (Signed)
 [place] MEDICAID FL2 LEVEL OF CARE FORM     IDENTIFICATION  Patient Name: Susan Ford Birthdate: 1930/01/21 Sex: female Admission Date (Current Location): 07/06/2023  [facility] and [place] Number:  Producer, television/film/video and Address:  [facility],  [address] [place], [place] [address]      Provider Number: [id number]  Attending Physician Name and Address:  Lewie Chamber, MD  Relative Name and Phone Number:  Kizzie Fantasia)  [phone]    Current Level of Care: Hospital Recommended Level of Care: Skilled Nursing Facility Prior Approval Number:    Date Approved/Denied:   PASRR Number: [id number]  Discharge Plan: SNF    Current Diagnoses: Patient Active Problem List   Diagnosis Date Noted   Physical deconditioning 07/06/2023   ABLA (acute blood loss anemia) April 19, 2023   History of bleeding peptic ulcer April 19, 2023   History of pulmonary embolism, 12/28/22 2023/04/19   Chronic anticoagulation 19-Apr-2023   Acute GI bleeding April 19, 2023   Acute UTI 04/19/23   Pulmonary embolism (HCC) 12/28/2022   Diarrhea 12/28/2022   Melena, with acute blood loss anemia 07/08/2022   Dementia without behavioral disturbance (HCC) 10/03/2017   Gait abnormality 06/29/2016   Chronic bilateral low back pain without sciatica 06/29/2016   Memory loss 10-22-2015   Death of husband April 18, 2014   Bereavement due to life event 04/18/14   Absolute anemia 04/08/2014   Atrial flutter (HCC) 02/23/2014   Acute calculous cholecystitis 02/21/2014   Spinal stenosis of lumbar region 01/04/2014   Recent bereavement 10/25/2013   Arthritis 10/25/2013   Hyperlipidemia 10/23/2013   Hereditary and idiopathic peripheral neuropathy    Nausea alone 07/14/2012   Neuropathy, peripheral 05/05/2011   High risk medication use 05/05/2011   Subcutaneous emphysema (HCC) 05/05/2011   Cough 04/03/2011   Neuropathy 04/03/2011   Osteoarthritis 05/15/2010   STIFFNESS OF JOINT NEC ANKLE AND  FOOT 05/15/2010   GANGLION CYST, WRIST, RIGHT 05/15/2010   VERTIGO, BENIGN PAROXYSMAL POSITION 01/31/2007    Orientation RESPIRATION BLADDER Height & Weight     Self, Time, Situation, Place  Normal Continent Weight:   Height:  5\' 4"  (162.6 cm)  BEHAVIORAL SYMPTOMS/MOOD NEUROLOGICAL BOWEL NUTRITION STATUS      Continent Diet (Regular)  AMBULATORY STATUS COMMUNICATION OF NEEDS Skin   Limited Assist Verbally Normal                       Personal Care Assistance Level of Assistance  Bathing, Feeding, Dressing Bathing Assistance: Limited assistance Feeding assistance: Independent Dressing Assistance: Limited assistance     Functional Limitations Info  Sight, Hearing, Speech Sight Info: Adequate Hearing Info: Impaired Speech Info: Adequate    SPECIAL CARE FACTORS FREQUENCY  PT (By licensed PT), OT (By licensed OT)     PT Frequency: 5x/wk OT Frequency: 5x/wk            Contractures Contractures Info: Not present    Additional Factors Info  Code Status, Allergies Code Status Info: FULL Allergies Info: Keflex (Cephalexin), Lyrica (Pregabalin), Oxytetracycline, Memantine           Current Medications (07/07/2023):  This is the current hospital active medication list Current Facility-Administered Medications  Medication Dose Route Frequency Provider Last Rate Last Admin   acetaminophen (TYLENOL) tablet 650 mg  650 mg Oral Q4H PRN Elayne Snare K, DO   650 mg at 07/07/23 0441   albuterol (PROVENTIL) (2.5 MG/3ML) 0.083% nebulizer solution 2.5 mg  2.5 mg Nebulization Q2H PRN Kirby Crigler, Mir  M, MD       ascorbic acid (VITAMIN C) tablet 1,000 mg  1,000 mg Oral Daily Kirby Crigler, Mir M, MD   1,000 mg at 07/07/23 1009   bisacodyl (DULCOLAX) suppository 10 mg  10 mg Rectal Daily PRN Lewie Chamber, MD   10 mg at 07/07/23 1252   dextromethorphan (DELSYM) 30 MG/5ML liquid 30 mg  30 mg Oral BID PRN Lewie Chamber, MD       enoxaparin (LOVENOX) injection 40 mg  40 mg  Subcutaneous Q24H Kirby Crigler, Mir M, MD   40 mg at 07/06/23 1837   gabapentin (NEURONTIN) capsule 300 mg  300 mg Oral QHS Kirby Crigler, Mir M, MD   300 mg at 07/06/23 2119   multivitamin with minerals tablet 1 tablet  1 tablet Oral QPC lunch Kirby Crigler, Mir M, MD   1 tablet at 07/07/23 1252   ondansetron (ZOFRAN) tablet 4 mg  4 mg Oral Q6H PRN Kirby Crigler, Mir M, MD       Or   ondansetron [facility]) injection 4 mg  4 mg Intravenous Q6H PRN Kirby Crigler, Mir M, MD       senna (SENOKOT) tablet 17.2 mg  2 tablet Oral Beryle Quant, Mir M, MD       simvastatin (ZOCOR) tablet 10 mg  10 mg Oral QPM Kirby Crigler, Mir M, MD   10 mg at 07/06/23 1837   traZODone (DESYREL) tablet 25 mg  25 mg Oral QHS PRN Maryln Gottron, MD         Discharge Medications: Please see discharge summary for a list of discharge medications.  Relevant Imaging Results:  Relevant Lab Results:   Additional Information SSN: [id number]  Otelia Santee, LCSW

## 2023-07-07 NOTE — TOC Initial Note (Signed)
 Transition of Care [facility]) - Initial/Assessment Note    Patient Details  Name: Susan Ford MRN: [id number] Date of Birth: 01-Mar-1930  [facility]) CM/SW Contact:    Jessie Foot, RN Phone [phone] 07/07/2023, 2:54 PM  Clinical Narrative:                 Patient presented for weakness. PTA patient was from [facility] independent [facility]. DME (cane , rollatoe, and shower chair). Discussed with patient that PT has recommendation for SNF. Patient is against going to SNF. Patient is accepting of HH. No further needs identified during the visit. Case Manager will continue to follow.  Expected Discharge Plan: Assisted Living Barriers to Discharge: Continued Medical Work up   Patient Goals and CMS Choice            Expected Discharge Plan and Services   Discharge Planning Services: CM Consult   Living arrangements for the past 2 months: Independent Living Facility                 DME Arranged: N/A DME Agency: [employer] (current with [employer])                  Prior Living Arrangements/Services Living arrangements for the past 2 months: Independent Living Facility Lives with:: Facility Resident Patient language and need for interpreter reviewed:: Yes Do you feel safe going back to the place where you live?: Yes      Need for Family Participation in Patient Care: No (Comment) Care giver support system in place?: Yes (comment) Current home services: DME, Other (comment) (cane, rollator, shower chair & oxygen) Criminal Activity/Legal Involvement Pertinent to Current Situation/Hospitalization: No - Comment as needed  Activities of Daily Living   ADL Screening (condition at time of admission) Independently performs ADLs?: Yes (appropriate for developmental age) Is the patient deaf or have difficulty hearing?: Yes Does the patient have difficulty seeing, even when wearing glasses/contacts?: No Does the patient have difficulty  concentrating, remembering, or making decisions?: No  Permission Sought/Granted Permission sought to share information with : Case Manager, Magazine features editor Permission granted to share information with : Yes, Verbal Permission Granted  Share Information with NAME: Molly Maduro and Casimiro Needle  Permission granted to share info w AGENCY: [facility]  Permission granted to share info w Relationship: sons     Emotional Assessment Appearance:: Appears stated age Attitude/Demeanor/Rapport: Engaged Affect (typically observed): Appropriate Orientation: : Oriented to Self, Oriented to Place, Oriented to  Time, Oriented to Situation Alcohol / Substance Use: Not Applicable Psych Involvement: No (comment)  Admission diagnosis:  Nausea [R11.0] Weakness [R53.1] Influenza A [J10.1] Generalized weakness [R53.1] Chest pain, unspecified type [R07.9] Patient Active Problem List   Diagnosis Date Noted   Physical deconditioning 07/06/2023   ABLA (acute blood loss anemia) 04-16-2023   History of bleeding peptic ulcer 04-16-23   History of pulmonary embolism, 12/28/22 04-16-23   Chronic anticoagulation 2023/04/16   Acute GI bleeding 04/16/2023   Acute UTI 04-16-2023   Pulmonary embolism (HCC) 12/28/2022   Diarrhea 12/28/2022   Melena, with acute blood loss anemia 07/08/2022   Dementia without behavioral disturbance (HCC) 10/03/2017   Gait abnormality 06/29/2016   Chronic bilateral low back pain without sciatica 06/29/2016   Memory loss 2015/10/19   Death of husband 2014-04-15   Bereavement due to life event 04-15-2014   Absolute anemia 04/08/2014   Atrial flutter (HCC) 02/23/2014   Acute calculous cholecystitis 02/21/2014   Spinal stenosis of lumbar region  01/04/2014   Recent bereavement 10/25/2013   Arthritis 10/25/2013   Hyperlipidemia 10/23/2013   Hereditary and idiopathic peripheral neuropathy    Nausea alone 07/14/2012   Neuropathy, peripheral 05/05/2011   High risk medication  use 05/05/2011   Subcutaneous emphysema (HCC) 05/05/2011   Cough 04/03/2011   Neuropathy 04/03/2011   Osteoarthritis 05/15/2010   STIFFNESS OF JOINT NEC ANKLE AND FOOT 05/15/2010   GANGLION CYST, WRIST, RIGHT 05/15/2010   VERTIGO, BENIGN PAROXYSMAL POSITION 01/31/2007   PCP:  Madelin Headings, MD Pharmacy:   [facility] [phone] - [place], [place] - [address] [facility] [address] [place] [address] Phone: [phone] Fax: [phone]  [place] [facility] [address] [place] [address] Phone: [phone] Fax: [phone]     Social Drivers of Health (SDOH) Social History: SDOH Screenings   Food Insecurity: No Food Insecurity (07/06/2023)  Housing: Low Risk  (07/07/2023)  Transportation Needs: No Transportation Needs (07/07/2023)  Utilities: Not At Risk (07/07/2023)  Alcohol Screen: Low Risk  (06/15/2023)  Depression (PHQ2-9): Low Risk  (06/15/2023)  Financial Resource Strain: Low Risk  (06/15/2023)  Physical Activity: Inactive (06/15/2023)  Social Connections: Moderately Integrated (07/06/2023)  Stress: No Stress Concern Present (06/15/2023)  Tobacco Use: Medium Risk (07/06/2023)  Health Literacy: Adequate Health Literacy (06/15/2023)   SDOH Interventions:     Readmission Risk Interventions     No data to display

## 2023-07-07 NOTE — Plan of Care (Signed)
  Problem: Elimination: Goal: Will not experience complications related to urinary retention Outcome: Progressing   Problem: Pain Managment: Goal: General experience of comfort will improve and/or be controlled Outcome: Progressing   Problem: Safety: Goal: Ability to remain free from injury will improve Outcome: Progressing   Problem: Skin Integrity: Goal: Risk for impaired skin integrity will decrease Outcome: Progressing

## 2023-07-07 NOTE — Progress Notes (Signed)
 Progress Note    Susan Ford   ZOX:[id number]  DOB: 29-Jun-1929  DOA: 07/06/2023     0 PCP: Madelin Headings, MD  Initial CC: weakness  Hospital Course: Susan Ford is a 89 yo female with PMH arthritis, GERD, cataracts, neuropathy, Alzheimer's dementia who presented with worsening weakness and cough. She was initially diagnosed with Flu on 3/6 and has had a slow recovery.  She was evaluated by PT after admission and also recommended for SNF due to the decline and slow recovery.   Interval History:  Continues to have cough this morning and ongoing weakness. Called and discussed PT rec's with son as well this morning. Further dispo planning TBD after they talk to patient.   Assessment and Plan: * Physical deconditioning - s/p recent flu diagnosis on 3/6; has had difficulty recovering and now more weak and not maintaining ability to continue at ALF; has been recommended for SNF per PT - optimistic that SNF would be short term if deconditioning solely due to recent viral infection  - family and patient to discuss dispo plans after rec's - continue with PT  Chest discomfort-resolved as of 07/07/2023 - presumed due to MSK pain from flu and cough - negative cardiac workup  Neuropathy, peripheral - continue gabapentin   Hyperlipidemia - continue zocor    Old records reviewed in assessment of this patient  Antimicrobials:   DVT prophylaxis:  enoxaparin (LOVENOX) injection 40 mg Start: 07/06/23 1700   Code Status:   Code Status: Full Code  Mobility Assessment (Last 72 Hours)     Mobility Assessment     Row Name 07/07/23 1017 07/06/23 2000 07/06/23 1732 07/06/23 1417     Does patient have an order for bedrest or is patient medically unstable No - Continue assessment No - Continue assessment No - Continue assessment --    What is the highest level of mobility based on the progressive mobility assessment? Level 2 (Chairfast) - Balance while sitting on edge of bed and  cannot stand Level 2 (Chairfast) - Balance while sitting on edge of bed and cannot stand Level 2 (Chairfast) - Balance while sitting on edge of bed and cannot stand Level 2 (Chairfast) - Balance while sitting on edge of bed and cannot stand    Is the above level different from baseline mobility prior to current illness? Yes - Recommend PT order Yes - Recommend PT order Yes - Recommend PT order --             Barriers to discharge: none Disposition Plan:  ALF with HH vs SNF HH orders placed: TBD Status is: Obs  Objective: Blood pressure 136/80, pulse 88, temperature 98.3 F (36.8 C), resp. rate 16, height 5\' 4"  (1.626 m), SpO2 96%.  Examination:  Physical Exam Constitutional:      General: She is not in acute distress.    Comments: Fatigued appearing  HENT:     Head: Normocephalic and atraumatic.     Mouth/Throat:     Mouth: Mucous membranes are moist.  Eyes:     Extraocular Movements: Extraocular movements intact.  Cardiovascular:     Rate and Rhythm: Normal rate and regular rhythm.  Pulmonary:     Effort: Pulmonary effort is normal. No respiratory distress.     Breath sounds: Normal breath sounds. No wheezing.     Comments: Coarse breath sounds Abdominal:     General: Bowel sounds are normal. There is no distension.     Palpations: Abdomen is  soft.     Tenderness: There is no abdominal tenderness.  Musculoskeletal:        General: Normal range of motion.     Cervical back: Normal range of motion and neck supple.  Skin:    General: Skin is warm and dry.  Neurological:     General: No focal deficit present.     Mental Status: She is alert.  Psychiatric:        Mood and Affect: Mood normal.      Consultants:    Procedures:    Data Reviewed: Results for orders placed or performed during the hospital encounter of 07/06/23 (from the past 24 hours)  Troponin I (High Sensitivity)     Status: None   Collection Time: 07/06/23  2:19 PM  Result Value Ref Range    Troponin I (High Sensitivity) 8 <18 ng/L  Troponin I (High Sensitivity)     Status: None   Collection Time: 07/06/23  4:31 PM  Result Value Ref Range   Troponin I (High Sensitivity) 6 <18 ng/L  Brain natriuretic peptide     Status: None   Collection Time: 07/06/23  4:31 PM  Result Value Ref Range   B Natriuretic Peptide 66.0 0.0 - 100.0 pg/mL  TSH     Status: None   Collection Time: 07/06/23  4:31 PM  Result Value Ref Range   TSH 0.644 0.350 - 4.500 uIU/mL  Basic metabolic panel     Status: Abnormal   Collection Time: 07/07/23  6:06 AM  Result Value Ref Range   Sodium 133 (L) 135 - 145 mmol/L   Potassium 3.8 3.5 - 5.1 mmol/L   Chloride 102 98 - 111 mmol/L   CO2 22 22 - 32 mmol/L   Glucose, Bld 95 70 - 99 mg/dL   BUN 13 8 - 23 mg/dL   Creatinine, Ser 9.56 0.44 - 1.00 mg/dL   Calcium 8.3 (L) 8.9 - 10.3 mg/dL   GFR, Estimated >21 >30 mL/min   Anion gap 9 5 - 15  CBC     Status: Abnormal   Collection Time: 07/07/23  6:06 AM  Result Value Ref Range   WBC 4.6 4.0 - 10.5 K/uL   RBC 3.81 (L) 3.87 - 5.11 MIL/uL   Hemoglobin 11.6 (L) 12.0 - 15.0 g/dL   HCT 86.5 (L) 78.4 - 69.6 %   MCV 92.4 80.0 - 100.0 fL   MCH 30.4 26.0 - 34.0 pg   MCHC 33.0 30.0 - 36.0 g/dL   RDW 29.5 28.4 - 13.2 %   Platelets 233 150 - 400 K/uL   nRBC 0.0 0.0 - 0.2 %    I have reviewed pertinent nursing notes, vitals, labs, and images as necessary. I have ordered labwork to follow up on as indicated.  I have reviewed the last notes from staff over past 24 hours. I have discussed patient's care plan and test results with nursing staff, CM/SW, and other staff as appropriate.  Time spent: Greater than 50% of the 55 minute visit was spent in counseling/coordination of care for the patient as laid out in the A&P.   LOS: 0 days   Lewie Chamber, MD [facility] 07/07/2023, 12:28 PM

## 2023-07-08 DIAGNOSIS — R0602 Shortness of breath: Secondary | ICD-10-CM | POA: Diagnosis not present

## 2023-07-08 DIAGNOSIS — Z7401 Bed confinement status: Secondary | ICD-10-CM | POA: Diagnosis not present

## 2023-07-08 DIAGNOSIS — G3184 Mild cognitive impairment, so stated: Secondary | ICD-10-CM | POA: Diagnosis not present

## 2023-07-08 DIAGNOSIS — G308 Other Alzheimer's disease: Secondary | ICD-10-CM | POA: Diagnosis not present

## 2023-07-08 DIAGNOSIS — R079 Chest pain, unspecified: Secondary | ICD-10-CM | POA: Diagnosis not present

## 2023-07-08 DIAGNOSIS — F4323 Adjustment disorder with mixed anxiety and depressed mood: Secondary | ICD-10-CM | POA: Diagnosis not present

## 2023-07-08 DIAGNOSIS — R52 Pain, unspecified: Secondary | ICD-10-CM | POA: Diagnosis not present

## 2023-07-08 DIAGNOSIS — R531 Weakness: Secondary | ICD-10-CM | POA: Diagnosis not present

## 2023-07-08 DIAGNOSIS — M069 Rheumatoid arthritis, unspecified: Secondary | ICD-10-CM | POA: Diagnosis not present

## 2023-07-08 DIAGNOSIS — G44219 Episodic tension-type headache, not intractable: Secondary | ICD-10-CM | POA: Diagnosis not present

## 2023-07-08 DIAGNOSIS — J101 Influenza due to other identified influenza virus with other respiratory manifestations: Secondary | ICD-10-CM | POA: Diagnosis not present

## 2023-07-08 DIAGNOSIS — R5381 Other malaise: Secondary | ICD-10-CM | POA: Diagnosis not present

## 2023-07-08 DIAGNOSIS — G309 Alzheimer's disease, unspecified: Secondary | ICD-10-CM | POA: Diagnosis not present

## 2023-07-08 DIAGNOSIS — R519 Headache, unspecified: Secondary | ICD-10-CM | POA: Diagnosis not present

## 2023-07-08 DIAGNOSIS — R2689 Other abnormalities of gait and mobility: Secondary | ICD-10-CM | POA: Diagnosis not present

## 2023-07-08 DIAGNOSIS — E785 Hyperlipidemia, unspecified: Secondary | ICD-10-CM | POA: Diagnosis not present

## 2023-07-08 DIAGNOSIS — M6281 Muscle weakness (generalized): Secondary | ICD-10-CM | POA: Diagnosis not present

## 2023-07-08 DIAGNOSIS — R110 Nausea: Secondary | ICD-10-CM | POA: Diagnosis not present

## 2023-07-08 DIAGNOSIS — R059 Cough, unspecified: Secondary | ICD-10-CM | POA: Diagnosis not present

## 2023-07-08 DIAGNOSIS — E871 Hypo-osmolality and hyponatremia: Secondary | ICD-10-CM | POA: Diagnosis not present

## 2023-07-08 DIAGNOSIS — G629 Polyneuropathy, unspecified: Secondary | ICD-10-CM | POA: Diagnosis not present

## 2023-07-08 DIAGNOSIS — F0390 Unspecified dementia without behavioral disturbance: Secondary | ICD-10-CM | POA: Diagnosis not present

## 2023-07-08 DIAGNOSIS — Z87891 Personal history of nicotine dependence: Secondary | ICD-10-CM | POA: Diagnosis not present

## 2023-07-08 DIAGNOSIS — F0280 Dementia in other diseases classified elsewhere without behavioral disturbance: Secondary | ICD-10-CM | POA: Diagnosis not present

## 2023-07-08 DIAGNOSIS — J111 Influenza due to unidentified influenza virus with other respiratory manifestations: Secondary | ICD-10-CM | POA: Diagnosis not present

## 2023-07-08 DIAGNOSIS — Z79899 Other long term (current) drug therapy: Secondary | ICD-10-CM | POA: Diagnosis not present

## 2023-07-08 DIAGNOSIS — M150 Primary generalized (osteo)arthritis: Secondary | ICD-10-CM | POA: Diagnosis not present

## 2023-07-08 MED ORDER — GABAPENTIN 300 MG PO CAPS: 300.0000 mg | ORAL_CAPSULE | Freq: Every day | ORAL | Status: DC

## 2023-07-08 NOTE — TOC Transition Note (Signed)
 Transition of Care [facility]) - Discharge Note   Patient Details  Name: Susan Ford MRN: [id number] Date of Birth: March 14, 1930  [facility] CM/SW Contact:  Otelia Santee, LCSW Phone Number: 07/08/2023, 11:26 AM   Clinical Narrative:    Spoke with pt's son, Molly Maduro to review bed offers for SNF. [name] has accepted offer for [facility]. Insurance auth requested and approved. Auth ID: [id number]. Spoke with pt and informed of discharge plans.  Pt is able to transfer to [facility] for ST SNF today. Pt will be going to room 116. RN to call report to [phone]. Spoke with pt's son to inform of discharge. DC packet placed at RN station. PTAR called at 12:13pm.    Final next level of care: Skilled Nursing Facility Barriers to Discharge: Barriers Resolved   Patient Goals and CMS Choice Patient states their goals for this hospitalization and ongoing recovery are:: For pt to go to SNF CMS Medicare.gov Compare Post Acute Care list provided to:: Patient Represenative (must comment) Choice offered to / list presented to : [facility] POA / Guardian Leith ownership interest in [facility].provided to:: [facility] POA / Guardian    Discharge Placement PASRR number recieved: 07/07/23            Patient chooses bed at: Other - please specify in the comment section below: [facility]) Patient to be transferred to facility by: PTAR Name of family member notified: Son, Molly Maduro Patient and family notified of of transfer: 07/08/23  Discharge Plan and Services Additional resources added to the After Visit Summary for     Discharge Planning Services: CM Consult            DME Arranged: N/A DME Agency: [employer] (current with [employer])                  Social Drivers of Health (SDOH) Interventions SDOH Screenings   Food Insecurity: No Food Insecurity (07/06/2023)  Housing: Low Risk  (07/07/2023)  Transportation Needs: No Transportation Needs (07/07/2023)   Utilities: Not At Risk (07/07/2023)  Alcohol Screen: Low Risk  (06/15/2023)  Depression (PHQ2-9): Low Risk  (06/15/2023)  Financial Resource Strain: Low Risk  (06/15/2023)  Physical Activity: Inactive (06/15/2023)  Social Connections: Moderately Integrated (07/06/2023)  Stress: No Stress Concern Present (06/15/2023)  Tobacco Use: Medium Risk (07/06/2023)  Health Literacy: Adequate Health Literacy (06/15/2023)     Readmission Risk Interventions     No data to display

## 2023-07-08 NOTE — Plan of Care (Addendum)
 VSS. No c/o pain. Patient required frequent redirection at start of shift -making attempts to exit bed and stating that "I need to get in my bed" although patient was already in bed. LBM 3/13. No acute events overnight.  Problem: Education: Goal: Knowledge of General Education information will improve Description: Including pain rating scale, medication(s)/side effects and non-pharmacologic comfort measures Outcome: Progressing   Problem: Clinical Measurements: Goal: Ability to maintain clinical measurements within normal limits will improve Outcome: Progressing Goal: Will remain free from infection Outcome: Progressing   Problem: Activity: Goal: Risk for activity intolerance will decrease Outcome: Progressing   Problem: Safety: Goal: Ability to remain free from injury will improve Outcome: Progressing

## 2023-07-08 NOTE — Discharge Summary (Signed)
 Physician Discharge Summary   Susan Ford YQM:[id number] DOB: [birth year] DOA: 07/06/2023  PCP: Madelin Headings, MD  Admit date: 07/06/2023 Discharge date: 07/08/2023  Admitted From: [facility] Disposition:  SNF Discharging physician: Lewie Chamber, MD Barriers to discharge: none   Discharge Condition: stable CODE STATUS: Full  Diet recommendation:  Diet Orders (From admission, onward)     Start     Ordered   07/08/23 0000  Diet general        07/08/23 1155   07/06/23 1617  Diet regular Room service appropriate? Yes; Fluid consistency: Thin  Diet effective now       Question Answer Comment  Room service appropriate? Yes   Fluid consistency: Thin      07/06/23 1616            Hospital Course: Ms. Scalf is a 89 yo female with PMH arthritis, GERD, cataracts, neuropathy, Alzheimer's dementia who presented with worsening weakness and cough. She was initially diagnosed with Flu on 3/6 and has had a slow recovery.  She was evaluated by PT after admission and also recommended for SNF due to the decline and slow recovery.   Assessment and Plan: * Physical deconditioning - s/p recent flu diagnosis on 3/6; has had difficulty recovering and now more weak and not maintaining ability to continue at ALF; has been recommended for SNF per PT - optimistic that SNF would be short term if deconditioning solely due to recent viral infection  - family and patient to discuss dispo plans after rec's - continue with PT  Influenza A - initially tested positive 3/6 - lingering lethargy and weakness - supportive care; continue droplet precautions  Chest discomfort-resolved as of 07/07/2023 - presumed due to MSK pain from flu and cough - negative cardiac workup  Neuropathy, peripheral - continue gabapentin   Hyperlipidemia - continue zocor     Principal Diagnosis: Physical deconditioning  Discharge Diagnoses: Active Hospital Problems   Diagnosis Date Noted   Physical  deconditioning 07/06/2023    Priority: 1.   Influenza A 07/08/2023    Priority: 2.   Neuropathy, peripheral 05/05/2011    Priority: 3.   Dementia without behavioral disturbance (HCC) 10/03/2017   Arthritis 10/25/2013   Hyperlipidemia 10/23/2013    Resolved Hospital Problems   Diagnosis Date Noted Date Resolved   Chest discomfort 07/07/2023 07/07/2023    Priority: 2.     Discharge Instructions     Diet general   Complete by: As directed    Increase activity slowly   Complete by: As directed       Allergies as of 07/08/2023       Reactions   Keflex [cephalexin]    Dizziness   Lyrica [pregabalin] Other (See Comments)   "High Fever"   Oxytetracycline Other (See Comments)   "High Fever" caused by terramycin   Memantine Rash        Medication List     TAKE these medications    acetaminophen 325 MG tablet Commonly known as: TYLENOL Take 650 mg by mouth every 6 (six) hours as needed for moderate pain (pain score 4-6) or mild pain (pain score 1-3).   gabapentin 300 MG capsule Commonly known as: NEURONTIN Take 1 capsule (300 mg total) by mouth at bedtime. What changed: when to take this   multivitamin tablet Take 1 tablet by mouth daily after lunch.   Prevagen 10 MG Caps Generic drug: Apoaequorin Take 10 mg by mouth daily.   senna 8.6 MG Tabs  tablet Commonly known as: SENOKOT Take 1 tablet (8.6 mg total) by mouth daily. What changed:  how much to take when to take this   simvastatin 10 MG tablet Commonly known as: ZOCOR TAKE 1 TABLET BY MOUTH EVERY DAY   vitamin C 1000 MG tablet Take 1,000 mg by mouth daily.        Contact information for after-discharge care     Destination     [facility] .   Service: Skilled Nursing Contact information: [address] [place] [address] [phone]                    Allergies  Allergen Reactions   Keflex [Cephalexin]     Dizziness   Lyrica [Pregabalin] Other  (See Comments)    "High Fever"    Oxytetracycline Other (See Comments)    "High Fever" caused by terramycin    Memantine Rash    Consultations:   Procedures:   Discharge Exam: BP 112/61 (BP Location: Right Arm)   Pulse 85   Temp 98.3 F (36.8 C)   Resp 16   Ht 5\' 4"  (1.626 m)   SpO2 93%   BMI 21.27 kg/m  Physical Exam Constitutional:      General: She is not in acute distress.    Comments: Fatigued appearing  HENT:     Head: Normocephalic and atraumatic.     Mouth/Throat:     Mouth: Mucous membranes are moist.  Eyes:     Extraocular Movements: Extraocular movements intact.  Cardiovascular:     Rate and Rhythm: Normal rate and regular rhythm.  Pulmonary:     Effort: Pulmonary effort is normal. No respiratory distress.     Breath sounds: Normal breath sounds. No wheezing.     Comments: Coarse breath sounds Abdominal:     General: Bowel sounds are normal. There is no distension.     Palpations: Abdomen is soft.     Tenderness: There is no abdominal tenderness.  Musculoskeletal:        General: Normal range of motion.     Cervical back: Normal range of motion and neck supple.  Skin:    General: Skin is warm and dry.  Neurological:     General: No focal deficit present.     Mental Status: She is alert.  Psychiatric:        Mood and Affect: Mood normal.      The results of significant diagnostics from this hospitalization (including imaging, microbiology, ancillary and laboratory) are listed below for reference.   Microbiology: Recent Results (from the past 240 hours)  Resp panel by RT-PCR (RSV, Flu A&B, Covid) Anterior Nasal Swab     Status: Abnormal   Collection Time: 06/30/23  3:49 PM   Specimen: Anterior Nasal Swab  Result Value Ref Range Status   SARS Coronavirus 2 by RT PCR NEGATIVE NEGATIVE Final    Comment: (NOTE) SARS-CoV-2 target nucleic acids are NOT DETECTED.  The SARS-CoV-2 RNA is generally detectable in upper respiratory specimens during  the acute phase of infection. The lowest concentration of SARS-CoV-2 viral copies this assay can detect is 138 copies/mL. A negative result does not preclude SARS-Cov-2 infection and should not be used as the sole basis for treatment or other patient management decisions. A negative result may occur with  improper specimen collection/handling, submission of specimen other than nasopharyngeal swab, presence of viral mutation(s) within the areas targeted by this assay, and inadequate number of viral  copies(<138 copies/mL). A negative result must be combined with clinical observations, patient history, and epidemiological information. The expected result is Negative.  Fact Sheet for Patients:  BloggerCourse.com  Fact Sheet for Healthcare Providers:  SeriousBroker.it  This test is no t yet approved or cleared by the [place] FDA and  has been authorized for detection and/or diagnosis of SARS-CoV-2 by FDA under an Emergency Use Authorization (EUA). This EUA will remain  in effect (meaning this test can be used) for the duration of the COVID-19 declaration under Section 564(b)(1) of the Act, 21 U.S.C.section 360bbb-3(b)(1), unless the authorization is terminated  or revoked sooner.       Influenza A by PCR POSITIVE (A) NEGATIVE Final   Influenza B by PCR NEGATIVE NEGATIVE Final    Comment: (NOTE) The Xpert Xpress SARS-CoV-2/FLU/RSV plus assay is intended as an aid in the diagnosis of influenza from Nasopharyngeal swab specimens and should not be used as a sole basis for treatment. Nasal washings and aspirates are unacceptable for Xpert Xpress SARS-CoV-2/FLU/RSV testing.  Fact Sheet for Patients: BloggerCourse.com  Fact Sheet for Healthcare Providers: SeriousBroker.it  This test is not yet approved or cleared by the [place] FDA and has been authorized for detection and/or  diagnosis of SARS-CoV-2 by FDA under an Emergency Use Authorization (EUA). This EUA will remain in effect (meaning this test can be used) for the duration of the COVID-19 declaration under Section 564(b)(1) of the Act, 21 U.S.C. section 360bbb-3(b)(1), unless the authorization is terminated or revoked.     Resp Syncytial Virus by PCR NEGATIVE NEGATIVE Final    Comment: (NOTE) Fact Sheet for Patients: BloggerCourse.com  Fact Sheet for Healthcare Providers: SeriousBroker.it  This test is not yet approved or cleared by the [place] FDA and has been authorized for detection and/or diagnosis of SARS-CoV-2 by FDA under an Emergency Use Authorization (EUA). This EUA will remain in effect (meaning this test can be used) for the duration of the COVID-19 declaration under Section 564(b)(1) of the Act, 21 U.S.C. section 360bbb-3(b)(1), unless the authorization is terminated or revoked.  Performed at [facility], [address], [place], [place] [address]   Resp panel by RT-PCR (RSV, Flu A&B, Covid) Anterior Nasal Swab     Status: Abnormal   Collection Time: 07/06/23  9:41 AM   Specimen: Anterior Nasal Swab  Result Value Ref Range Status   SARS Coronavirus 2 by RT PCR NEGATIVE NEGATIVE Final    Comment: (NOTE) SARS-CoV-2 target nucleic acids are NOT DETECTED.  The SARS-CoV-2 RNA is generally detectable in upper respiratory specimens during the acute phase of infection. The lowest concentration of SARS-CoV-2 viral copies this assay can detect is 138 copies/mL. A negative result does not preclude SARS-Cov-2 infection and should not be used as the sole basis for treatment or other patient management decisions. A negative result may occur with  improper specimen collection/handling, submission of specimen other than nasopharyngeal swab, presence of viral mutation(s) within the areas targeted by this assay, and  inadequate number of viral copies(<138 copies/mL). A negative result must be combined with clinical observations, patient history, and epidemiological information. The expected result is Negative.  Fact Sheet for Patients:  BloggerCourse.com  Fact Sheet for Healthcare Providers:  SeriousBroker.it  This test is no t yet approved or cleared by the [place] FDA and  has been authorized for detection and/or diagnosis of SARS-CoV-2 by FDA under an Emergency Use Authorization (EUA). This EUA will remain  in effect (meaning  this test can be used) for the duration of the COVID-19 declaration under Section 564(b)(1) of the Act, 21 U.S.C.section 360bbb-3(b)(1), unless the authorization is terminated  or revoked sooner.       Influenza A by PCR POSITIVE (A) NEGATIVE Final   Influenza B by PCR NEGATIVE NEGATIVE Final    Comment: (NOTE) The Xpert Xpress SARS-CoV-2/FLU/RSV plus assay is intended as an aid in the diagnosis of influenza from Nasopharyngeal swab specimens and should not be used as a sole basis for treatment. Nasal washings and aspirates are unacceptable for Xpert Xpress SARS-CoV-2/FLU/RSV testing.  Fact Sheet for Patients: BloggerCourse.com  Fact Sheet for Healthcare Providers: SeriousBroker.it  This test is not yet approved or cleared by the [place] FDA and has been authorized for detection and/or diagnosis of SARS-CoV-2 by FDA under an Emergency Use Authorization (EUA). This EUA will remain in effect (meaning this test can be used) for the duration of the COVID-19 declaration under Section 564(b)(1) of the Act, 21 U.S.C. section 360bbb-3(b)(1), unless the authorization is terminated or revoked.     Resp Syncytial Virus by PCR NEGATIVE NEGATIVE Final    Comment: (NOTE) Fact Sheet for Patients: BloggerCourse.com  Fact Sheet for  Healthcare Providers: SeriousBroker.it  This test is not yet approved or cleared by the [place] FDA and has been authorized for detection and/or diagnosis of SARS-CoV-2 by FDA under an Emergency Use Authorization (EUA). This EUA will remain in effect (meaning this test can be used) for the duration of the COVID-19 declaration under Section 564(b)(1) of the Act, 21 U.S.C. section 360bbb-3(b)(1), unless the authorization is terminated or revoked.  Performed at [facility], [address], [place], [place] [address]      Labs: BNP (last 3 results) Recent Labs    07/06/23 1631  BNP 66.0   Basic Metabolic Panel: Recent Labs  Lab 07/06/23 0941 07/07/23 0606  NA 132* 133*  K 3.8 3.8  CL 102 102  CO2 21* 22  GLUCOSE 94 95  BUN 17 13  CREATININE 0.69 0.54  CALCIUM 8.5* 8.3*   Liver Function Tests: Recent Labs  Lab 07/06/23 0941  AST 27  ALT 14  ALKPHOS 61  BILITOT 0.6  PROT 6.6  ALBUMIN 3.4*   Recent Labs  Lab 07/06/23 0941  LIPASE 50   No results for input(s): "AMMONIA" in the last 168 hours. CBC: Recent Labs  Lab 07/06/23 0941 07/07/23 0606  WBC 3.9* 4.6  NEUTROABS 1.9  --   HGB 12.7 11.6*  HCT 37.6 35.2*  MCV 90.6 92.4  PLT 254 233   Cardiac Enzymes: No results for input(s): "CKTOTAL", "CKMB", "CKMBINDEX", "TROPONINI" in the last 168 hours. BNP: Invalid input(s): "POCBNP" CBG: No results for input(s): "GLUCAP" in the last 168 hours. D-Dimer No results for input(s): "DDIMER" in the last 72 hours. Hgb A1c No results for input(s): "HGBA1C" in the last 72 hours. Lipid Profile No results for input(s): "CHOL", "HDL", "LDLCALC", "TRIG", "CHOLHDL", "LDLDIRECT" in the last 72 hours. Thyroid function studies Recent Labs    07/06/23 1631  TSH 0.644   Anemia work up No results for input(s): "VITAMINB12", "FOLATE", "FERRITIN", "TIBC", "IRON", "RETICCTPCT" in the last 72 hours. Urinalysis     Component Value Date/Time   COLORURINE YELLOW 06/30/2023 1548   APPEARANCEUR CLEAR 06/30/2023 1548   LABSPEC 1.024 06/30/2023 1548   PHURINE 5.0 06/30/2023 1548   GLUCOSEU NEGATIVE 06/30/2023 1548   GLUCOSEU NEGATIVE 02/26/2016 1031   HGBUR NEGATIVE 06/30/2023 1548  BILIRUBINUR NEGATIVE 06/30/2023 1548   BILIRUBINUR neg 05/05/2021 1059   KETONESUR 5 (A) 06/30/2023 1548   PROTEINUR NEGATIVE 06/30/2023 1548   UROBILINOGEN 0.2 01/23/2022 1345   NITRITE NEGATIVE 06/30/2023 1548   LEUKOCYTESUR TRACE (A) 06/30/2023 1548   Sepsis Labs Recent Labs  Lab 07/06/23 0941 07/07/23 0606  WBC 3.9* 4.6   Microbiology Recent Results (from the past 240 hours)  Resp panel by RT-PCR (RSV, Flu A&B, Covid) Anterior Nasal Swab     Status: Abnormal   Collection Time: 06/30/23  3:49 PM   Specimen: Anterior Nasal Swab  Result Value Ref Range Status   SARS Coronavirus 2 by RT PCR NEGATIVE NEGATIVE Final    Comment: (NOTE) SARS-CoV-2 target nucleic acids are NOT DETECTED.  The SARS-CoV-2 RNA is generally detectable in upper respiratory specimens during the acute phase of infection. The lowest concentration of SARS-CoV-2 viral copies this assay can detect is 138 copies/mL. A negative result does not preclude SARS-Cov-2 infection and should not be used as the sole basis for treatment or other patient management decisions. A negative result may occur with  improper specimen collection/handling, submission of specimen other than nasopharyngeal swab, presence of viral mutation(s) within the areas targeted by this assay, and inadequate number of viral copies(<138 copies/mL). A negative result must be combined with clinical observations, patient history, and epidemiological information. The expected result is Negative.  Fact Sheet for Patients:  BloggerCourse.com  Fact Sheet for Healthcare Providers:  SeriousBroker.it  This test is no t yet approved  or cleared by the [place] FDA and  has been authorized for detection and/or diagnosis of SARS-CoV-2 by FDA under an Emergency Use Authorization (EUA). This EUA will remain  in effect (meaning this test can be used) for the duration of the COVID-19 declaration under Section 564(b)(1) of the Act, 21 U.S.C.section 360bbb-3(b)(1), unless the authorization is terminated  or revoked sooner.       Influenza A by PCR POSITIVE (A) NEGATIVE Final   Influenza B by PCR NEGATIVE NEGATIVE Final    Comment: (NOTE) The Xpert Xpress SARS-CoV-2/FLU/RSV plus assay is intended as an aid in the diagnosis of influenza from Nasopharyngeal swab specimens and should not be used as a sole basis for treatment. Nasal washings and aspirates are unacceptable for Xpert Xpress SARS-CoV-2/FLU/RSV testing.  Fact Sheet for Patients: BloggerCourse.com  Fact Sheet for Healthcare Providers: SeriousBroker.it  This test is not yet approved or cleared by the [place] FDA and has been authorized for detection and/or diagnosis of SARS-CoV-2 by FDA under an Emergency Use Authorization (EUA). This EUA will remain in effect (meaning this test can be used) for the duration of the COVID-19 declaration under Section 564(b)(1) of the Act, 21 U.S.C. section 360bbb-3(b)(1), unless the authorization is terminated or revoked.     Resp Syncytial Virus by PCR NEGATIVE NEGATIVE Final    Comment: (NOTE) Fact Sheet for Patients: BloggerCourse.com  Fact Sheet for Healthcare Providers: SeriousBroker.it  This test is not yet approved or cleared by the [place] FDA and has been authorized for detection and/or diagnosis of SARS-CoV-2 by FDA under an Emergency Use Authorization (EUA). This EUA will remain in effect (meaning this test can be used) for the duration of the COVID-19 declaration under Section 564(b)(1) of the  Act, 21 U.S.C. section 360bbb-3(b)(1), unless the authorization is terminated or revoked.  Performed at [facility], [address], [place], [place] [address]   Resp panel by RT-PCR (RSV, Flu A&B, Covid) Anterior  Nasal Swab     Status: Abnormal   Collection Time: 07/06/23  9:41 AM   Specimen: Anterior Nasal Swab  Result Value Ref Range Status   SARS Coronavirus 2 by RT PCR NEGATIVE NEGATIVE Final    Comment: (NOTE) SARS-CoV-2 target nucleic acids are NOT DETECTED.  The SARS-CoV-2 RNA is generally detectable in upper respiratory specimens during the acute phase of infection. The lowest concentration of SARS-CoV-2 viral copies this assay can detect is 138 copies/mL. A negative result does not preclude SARS-Cov-2 infection and should not be used as the sole basis for treatment or other patient management decisions. A negative result may occur with  improper specimen collection/handling, submission of specimen other than nasopharyngeal swab, presence of viral mutation(s) within the areas targeted by this assay, and inadequate number of viral copies(<138 copies/mL). A negative result must be combined with clinical observations, patient history, and epidemiological information. The expected result is Negative.  Fact Sheet for Patients:  BloggerCourse.com  Fact Sheet for Healthcare Providers:  SeriousBroker.it  This test is no t yet approved or cleared by the [place] FDA and  has been authorized for detection and/or diagnosis of SARS-CoV-2 by FDA under an Emergency Use Authorization (EUA). This EUA will remain  in effect (meaning this test can be used) for the duration of the COVID-19 declaration under Section 564(b)(1) of the Act, 21 U.S.C.section 360bbb-3(b)(1), unless the authorization is terminated  or revoked sooner.       Influenza A by PCR POSITIVE (A) NEGATIVE Final   Influenza B by PCR NEGATIVE  NEGATIVE Final    Comment: (NOTE) The Xpert Xpress SARS-CoV-2/FLU/RSV plus assay is intended as an aid in the diagnosis of influenza from Nasopharyngeal swab specimens and should not be used as a sole basis for treatment. Nasal washings and aspirates are unacceptable for Xpert Xpress SARS-CoV-2/FLU/RSV testing.  Fact Sheet for Patients: BloggerCourse.com  Fact Sheet for Healthcare Providers: SeriousBroker.it  This test is not yet approved or cleared by the [place] FDA and has been authorized for detection and/or diagnosis of SARS-CoV-2 by FDA under an Emergency Use Authorization (EUA). This EUA will remain in effect (meaning this test can be used) for the duration of the COVID-19 declaration under Section 564(b)(1) of the Act, 21 U.S.C. section 360bbb-3(b)(1), unless the authorization is terminated or revoked.     Resp Syncytial Virus by PCR NEGATIVE NEGATIVE Final    Comment: (NOTE) Fact Sheet for Patients: BloggerCourse.com  Fact Sheet for Healthcare Providers: SeriousBroker.it  This test is not yet approved or cleared by the [place] FDA and has been authorized for detection and/or diagnosis of SARS-CoV-2 by FDA under an Emergency Use Authorization (EUA). This EUA will remain in effect (meaning this test can be used) for the duration of the COVID-19 declaration under Section 564(b)(1) of the Act, 21 U.S.C. section 360bbb-3(b)(1), unless the authorization is terminated or revoked.  Performed at [facility], [address] [place], [place] [address]     Procedures/Studies: DG Chest Port 1 View Result Date: 07/06/2023 CLINICAL DATA:  Cough.  Shortness of breath. EXAM: PORTABLE CHEST 1 VIEW COMPARISON:  06/30/2023. FINDINGS: Bilateral lung fields are clear. Stable 5 mm calcified granuloma overlying the right mid lung zone. Bilateral  costophrenic angles are clear. Normal cardio-mediastinal silhouette. No acute osseous abnormalities. The soft tissues are within normal limits. IMPRESSION: No active disease. Electronically Signed   By: Jules Schick M.D.   On: 07/06/2023 12:09   DG Chest 2 View  Result Date: 06/30/2023 CLINICAL DATA:  Cough. EXAM: CHEST - 2 VIEW COMPARISON:  June 24, 2023. FINDINGS: The heart size and mediastinal contours are within normal limits. No acute pulmonary disease is noted. The visualized skeletal structures are unremarkable. IMPRESSION: No active cardiopulmonary disease. Electronically Signed   By: Lupita Raider M.D.   On: 06/30/2023 17:34   DG Chest 2 View Result Date: 06/24/2023 CLINICAL DATA:  Cough EXAM: CHEST - 2 VIEW COMPARISON:  05/11/2023 FINDINGS: Fine reticular opacity at the bases consistent with fibrosis and chronic lung disease. No acute airspace disease, pleural effusion or pneumothorax. Stable cardiomediastinal silhouette. IMPRESSION: Chronic lung disease and fibrosis.  No acute airspace disease Electronically Signed   By: Jasmine Pang M.D.   On: 06/24/2023 18:32     Time coordinating discharge: Over 30 minutes    Lewie Chamber, MD  [facility] 07/08/2023, 11:56 AM

## 2023-07-08 NOTE — Assessment & Plan Note (Signed)
-   initially tested positive 3/6 - lingering lethargy and weakness - supportive care; continue droplet precautions

## 2023-07-08 NOTE — Plan of Care (Signed)

## 2023-07-11 DIAGNOSIS — F0390 Unspecified dementia without behavioral disturbance: Secondary | ICD-10-CM | POA: Diagnosis not present

## 2023-07-11 DIAGNOSIS — G629 Polyneuropathy, unspecified: Secondary | ICD-10-CM | POA: Diagnosis not present

## 2023-07-11 DIAGNOSIS — R5381 Other malaise: Secondary | ICD-10-CM | POA: Diagnosis not present

## 2023-07-11 DIAGNOSIS — E785 Hyperlipidemia, unspecified: Secondary | ICD-10-CM | POA: Diagnosis not present

## 2023-07-17 NOTE — Telephone Encounter (Signed)
 She was in hospital for influenza A  do not refill until we have contact her since was dced in hospital

## 2023-07-18 DIAGNOSIS — E785 Hyperlipidemia, unspecified: Secondary | ICD-10-CM | POA: Diagnosis not present

## 2023-07-18 DIAGNOSIS — G629 Polyneuropathy, unspecified: Secondary | ICD-10-CM | POA: Diagnosis not present

## 2023-07-18 DIAGNOSIS — F0390 Unspecified dementia without behavioral disturbance: Secondary | ICD-10-CM | POA: Diagnosis not present

## 2023-07-18 DIAGNOSIS — R5381 Other malaise: Secondary | ICD-10-CM | POA: Diagnosis not present

## 2023-07-19 DIAGNOSIS — G44219 Episodic tension-type headache, not intractable: Secondary | ICD-10-CM | POA: Diagnosis not present

## 2023-07-19 DIAGNOSIS — J111 Influenza due to unidentified influenza virus with other respiratory manifestations: Secondary | ICD-10-CM | POA: Diagnosis not present

## 2023-07-19 DIAGNOSIS — E785 Hyperlipidemia, unspecified: Secondary | ICD-10-CM | POA: Diagnosis not present

## 2023-07-19 DIAGNOSIS — F0390 Unspecified dementia without behavioral disturbance: Secondary | ICD-10-CM | POA: Diagnosis not present

## 2023-07-22 DIAGNOSIS — R519 Headache, unspecified: Secondary | ICD-10-CM | POA: Diagnosis not present

## 2023-07-22 DIAGNOSIS — R52 Pain, unspecified: Secondary | ICD-10-CM | POA: Diagnosis not present

## 2023-07-22 DIAGNOSIS — E785 Hyperlipidemia, unspecified: Secondary | ICD-10-CM | POA: Diagnosis not present

## 2023-07-22 DIAGNOSIS — F0390 Unspecified dementia without behavioral disturbance: Secondary | ICD-10-CM | POA: Diagnosis not present

## 2023-07-25 ENCOUNTER — Other Ambulatory Visit: Payer: Self-pay | Admitting: Internal Medicine

## 2023-07-25 NOTE — Telephone Encounter (Signed)
 Copied from CRM [id number]. Topic: Clinical - Medication Refill >> Jul 25, 2023 12:26 PM Drema Balzarine wrote: Most Recent Primary Care Visit:  Provider: Tillie Rung  Department: [facility]  Visit Type: MEDICARE AWV, SEQUENTIAL  Date: 06/15/2023  Medication: gabapentin   Has the patient contacted their pharmacy? Yes (Agent: If no, request that the patient contact the pharmacy for the refill. If patient does not wish to contact the pharmacy document the reason why and proceed with request.) (Agent: If yes, when and what did the pharmacy advise?)  Is this the correct pharmacy for this prescription? Yes If no, delete pharmacy and type the correct one.  This is the patient's preferred pharmacy:   [id number] - [place], [place] - [address] AT [facility] [address] [place] [address] Phone: [phone] Fax: [phone]   Has the prescription been filled recently? Yes  Is the patient out of the medication? Yes  Has the patient been seen for an appointment in the last year OR does the patient have an upcoming appointment? Yes  Can we respond through MyChart? Yes  Agent: Please be advised that Rx refills may take up to 3 business days. We ask that you follow-up with your pharmacy.

## 2023-07-28 NOTE — Telephone Encounter (Signed)
 Attempted to reach pt.  No success.   Will try again.

## 2023-07-29 NOTE — Telephone Encounter (Signed)
 Attempted to reach [facility] to talk to someone regarding to gabapentin refill. Send voicemail to call us back.

## 2023-08-01 ENCOUNTER — Other Ambulatory Visit: Payer: Self-pay | Admitting: Internal Medicine

## 2023-08-01 NOTE — Telephone Encounter (Signed)
 Copied from CRM [id number]. Topic: Clinical - Medication Refill >> Aug 01, 2023  9:10 AM Alcus Dad wrote: Most Recent Primary Care Visit:  Provider: Tillie Rung  Department: [facility]  Visit Type: MEDICARE AWV, SEQUENTIAL  Date: 06/15/2023  Medication: gabapentin (NEURONTIN) 300 MG capsule  Has the patient contacted their pharmacy? Yes (Agent: If no, request that the patient contact the pharmacy for the refill. If patient does not wish to contact the pharmacy document the reason why and proceed with request.) (Agent: If yes, when and what did the pharmacy advise?)  Is this the correct pharmacy for this prescription? Yes If no, delete pharmacy and type the correct one.  This is the patient's preferred pharmacy:  [id number] - [place], [place] - [address] AT [facility] [address] [place] [address] Phone: [phone] Fax: [phone]    Has the prescription been filled recently? No  Is the patient out of the medication? Yes  Has the patient been seen for an appointment in the last year OR does the patient have an upcoming appointment? Yes  Can we respond through MyChart? Yes  Agent: Please be advised that Rx refills may take up to 3 business days. We ask that you follow-up with your pharmacy.

## 2023-08-09 ENCOUNTER — Ambulatory Visit: Payer: Medicare Other | Admitting: Internal Medicine

## 2023-08-09 NOTE — Progress Notes (Unsigned)
 No chief complaint on file.   HPI: Susan Ford 89 y.o. come in for Chronic disease management   But has had 3 ed visits  in feb march 2 28  3  06 and 3 12   influenza A  and weakness and slow recovery  ROS: See pertinent positives and negatives per HPI.  Past Medical History:  Diagnosis Date   Arthritis    Cataract 2007   bilateral cat ext    Cholecystitis    Complication of anesthesia Feb 21, 2014   atrial fib after cholecystectomy, lasted a few hours, none since   Dysrhythmia 02-21-2014   after cholecystectomy had atrial fib for a few hours, none since   GERD (gastroesophageal reflux disease)    Had endoscopy no history of Barrett's   Hx of nonmelanoma skin cancer    Unspecified hereditary and idiopathic peripheral neuropathy     Family History  Problem Relation Age of Onset   Neurodegenerative disease Mother        Shy Drager died at 3   Alcohol abuse Other    Heart disease Other     Social History   Socioeconomic History   Marital status: Widowed    Spouse name: Not on file   Number of children: 3   Years of education: 15 years   Highest education level: Associate degree: occupational, Scientist, product/process development, or vocational program  Occupational History   Occupation: Retired    Comment: Charity fundraiser  Tobacco Use   Smoking status: Former    Current packs/day: 0.00    Average packs/day: 0.3 packs/day for 7.0 years (1.8 ttl pk-yrs)    Types: Cigarettes    Start date: 04/26/1956    Quit date: 04/27/1963    Years since quitting: 60.3   Smokeless tobacco: Never  Vaping Use   Vaping status: Never Used  Substance and Sexual Activity   Alcohol use: Yes    Alcohol/week: 7.0 standard drinks of alcohol    Types: 7 Glasses of wine per week    Comment: 1 glass of wine daily   Drug use: No    Types: Hydrocodone   Sexual activity: Yes  Other Topics Concern   Not on file  Social History Narrative   Retired Charity fundraiser    widowed since    Spring  17    [facility]   Regular exercise former  smoker   G3P3   From [place] originally      05/12/2018:   Lives alone on ranch house, but has stairs to garage with "acorn chair" lift    Son in [place], and two daughters out of state, all of whom are supportive    Worked in NICU, 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          Social Drivers of Corporate investment banker Strain: Low Risk  (06/15/2023)   Overall Financial Resource Strain (CARDIA)    Difficulty of Paying Living Expenses: Not hard at all  Food Insecurity: No Food Insecurity (07/06/2023)   Hunger Vital Sign    Worried About Running Out of Food in the Last Year: Never true    Ran Out of Food in the Last Year: Never true  Transportation Needs: No Transportation Needs (07/07/2023)   PRAPARE - Administrator, Civil Service (Medical): No    Lack of Transportation (Non-Medical): No  Physical Activity: Inactive (06/15/2023)   Exercise Vital Sign    Days  of Exercise per Week: 0 days    Minutes of Exercise per Session: 0 min  Stress: No Stress Concern Present (06/15/2023)   [employer] of Occupational Health - Occupational Stress Questionnaire    Feeling of Stress : Not at all  Social Connections: Moderately Integrated (07/06/2023)   Social Connection and Isolation Panel [NHANES]    Frequency of Communication with Friends and Family: More than three times a week    Frequency of Social Gatherings with Friends and Family: More than three times a week    Attends Religious Services: More than 4 times per year    Active Member of [employer] or Organizations: Yes    Attends Banker Meetings: More than 4 times per year    Marital Status: Widowed    Outpatient Medications Prior to Visit  Medication Sig Dispense Refill   acetaminophen (TYLENOL) 325 MG tablet Take 650 mg by mouth every 6 (six) hours as needed for moderate pain (pain score 4-6) or mild pain (pain score 1-3).     Apoaequorin (PREVAGEN) 10 MG  CAPS Take 10 mg by mouth daily.     Ascorbic Acid (VITAMIN C) 1000 MG tablet Take 1,000 mg by mouth daily.     gabapentin (NEURONTIN) 300 MG capsule Take 1 capsule (300 mg total) by mouth at bedtime.     Multiple Vitamin (MULTIVITAMIN) tablet Take 1 tablet by mouth daily after lunch.     senna (SENOKOT) 8.6 MG TABS tablet Take 1 tablet (8.6 mg total) by mouth daily. (Patient taking differently: Take 2 tablets by mouth every other day.) 120 tablet 0   simvastatin (ZOCOR) 10 MG tablet TAKE 1 TABLET BY MOUTH EVERY DAY (Patient taking differently: Take 10 mg by mouth daily.) 90 tablet 1   No facility-administered medications prior to visit.     EXAM:  There were no vitals taken for this visit.  There is no height or weight on file to calculate BMI.  GENERAL: vitals reviewed and listed above, alert, oriented, appears well hydrated and in no acute distress HEENT: atraumatic, conjunctiva  clear, no obvious abnormalities on inspection of external nose and ears  NECK: no obvious masses on inspection palpation  LUNGS: clear to auscultation bilaterally, no wheezes, rales or rhonchi, good air movement CV: HRRR, no clubbing cyanosis or  peripheral edema nl cap refill  MS: moves all extremities without noticeable focal  abnormality PSYCH: pleasant and cooperative, no obvious depression or anxiety Lab Results  Component Value Date   WBC 4.6 07/07/2023   HGB 11.6 (L) 07/07/2023   HCT 35.2 (L) 07/07/2023   PLT 233 07/07/2023   GLUCOSE 95 07/07/2023   CHOL 150 05/05/2021   TRIG 191.0 (H) 05/05/2021   HDL 64.30 05/05/2021   LDLDIRECT 153 02/12/2009   LDLCALC 48 05/05/2021   ALT 14 07/06/2023   AST 27 07/06/2023   NA 133 (L) 07/07/2023   K 3.8 07/07/2023   CL 102 07/07/2023   CREATININE 0.54 07/07/2023   BUN 13 07/07/2023   CO2 22 07/07/2023   TSH 0.644 07/06/2023   INR 1.0 04/11/2023   HGBA1C 5.5 05/05/2021   BP Readings from Last 3 Encounters:  07/08/23 112/61  06/30/23 (!) 152/131   06/24/23 134/80    ASSESSMENT AND PLAN:  Discussed the following assessment and plan:  No diagnosis found.  -Patient advised to return or notify health care team  if  new concerns arise.  There are no Patient Instructions on file for this visit.  Kethan Papadopoulos K. Seleny Allbright M.D.

## 2023-08-10 ENCOUNTER — Encounter: Payer: Self-pay | Admitting: Internal Medicine

## 2023-08-10 ENCOUNTER — Telehealth: Payer: Self-pay | Admitting: Internal Medicine

## 2023-08-10 ENCOUNTER — Ambulatory Visit (INDEPENDENT_AMBULATORY_CARE_PROVIDER_SITE_OTHER): Payer: Medicare Other | Admitting: Internal Medicine

## 2023-08-10 VITALS — BP 112/70 | HR 117 | Temp 97.9°F | Wt 112.4 lb

## 2023-08-10 DIAGNOSIS — R634 Abnormal weight loss: Secondary | ICD-10-CM

## 2023-08-10 DIAGNOSIS — Z09 Encounter for follow-up examination after completed treatment for conditions other than malignant neoplasm: Secondary | ICD-10-CM

## 2023-08-10 DIAGNOSIS — D500 Iron deficiency anemia secondary to blood loss (chronic): Secondary | ICD-10-CM

## 2023-08-10 DIAGNOSIS — Z79899 Other long term (current) drug therapy: Secondary | ICD-10-CM

## 2023-08-10 DIAGNOSIS — R54 Age-related physical debility: Secondary | ICD-10-CM

## 2023-08-10 LAB — CBC WITH DIFFERENTIAL/PLATELET
Basophils Absolute: 0.1 10*3/uL (ref 0.0–0.1)
Basophils Relative: 0.8 % (ref 0.0–3.0)
Eosinophils Absolute: 0.5 10*3/uL (ref 0.0–0.7)
Eosinophils Relative: 5.4 % — ABNORMAL HIGH (ref 0.0–5.0)
HCT: 39.5 % (ref 36.0–46.0)
Hemoglobin: 13.2 g/dL (ref 12.0–15.0)
Lymphocytes Relative: 15 % (ref 12.0–46.0)
Lymphs Abs: 1.4 10*3/uL (ref 0.7–4.0)
MCHC: 33.4 g/dL (ref 30.0–36.0)
MCV: 91.8 fl (ref 78.0–100.0)
Monocytes Absolute: 0.6 10*3/uL (ref 0.1–1.0)
Monocytes Relative: 6.2 % (ref 3.0–12.0)
Neutro Abs: 7 10*3/uL (ref 1.4–7.7)
Neutrophils Relative %: 72.6 % (ref 43.0–77.0)
Platelets: 250 10*3/uL (ref 150.0–400.0)
RBC: 4.3 Mil/uL (ref 3.87–5.11)
RDW: 14.6 % (ref 11.5–15.5)
WBC: 9.6 10*3/uL (ref 4.0–10.5)

## 2023-08-10 LAB — BASIC METABOLIC PANEL WITH GFR
BUN: 8 mg/dL (ref 6–23)
CO2: 25 meq/L (ref 19–32)
Calcium: 9 mg/dL (ref 8.4–10.5)
Chloride: 101 meq/L (ref 96–112)
Creatinine, Ser: 0.66 mg/dL (ref 0.40–1.20)
GFR: 76.32 mL/min (ref >60.00)
Glucose, Bld: 92 mg/dL (ref 70–99)
Potassium: 4.2 meq/L (ref 3.5–5.1)
Sodium: 134 meq/L — ABNORMAL LOW (ref 135–145)

## 2023-08-10 LAB — IBC + FERRITIN
Ferritin: 25.3 ng/mL (ref 10.0–291.0)
Iron: 64 ug/dL (ref 42–145)
Saturation Ratios: 22.5 % (ref 20.0–50.0)
TIBC: 284.2 ug/dL (ref 250.0–450.0)
Transferrin: 203 mg/dL — ABNORMAL LOW (ref 212.0–360.0)

## 2023-08-10 LAB — HEPATIC FUNCTION PANEL
ALT: 10 U/L (ref 0–35)
AST: 20 U/L (ref 0–37)
Albumin: 3.7 g/dL (ref 3.5–5.2)
Alkaline Phosphatase: 67 U/L (ref 39–117)
Bilirubin, Direct: 0.1 mg/dL (ref 0.0–0.3)
Total Bilirubin: 0.5 mg/dL (ref 0.2–1.2)
Total Protein: 6.3 g/dL (ref 6.0–8.3)

## 2023-08-10 LAB — VITAMIN B12: Vitamin B-12: 578 pg/mL (ref 211–911)

## 2023-08-10 NOTE — Patient Instructions (Signed)
 Good to see you today  You have slost weight since last hospital admission and could be from being so sick.  Want you to make sure eating enough protein food etc . Consider  Boost for extra protein or similar once a day .  Checking anemia and lab follo w up today .

## 2023-08-10 NOTE — Progress Notes (Signed)
 Lab results are better   anemia is better   b12 and nutrition levels are better  No change in advice  Plan fu in 3 months as planned

## 2023-08-10 NOTE — Telephone Encounter (Unsigned)
 Copied from CRM [id number]. Topic: Clinical - Medication Question >> Aug 10, 2023  1:06 PM Clyde Darling P wrote: Reason for CRM: Patient would like to know if she can still take gabapentin (NEURONTIN) 300 MG capsule 4x a day or quit taking it altogether, she forgot to ask at appt

## 2023-08-15 ENCOUNTER — Telehealth: Payer: Self-pay

## 2023-08-15 NOTE — Telephone Encounter (Signed)
 Last rx was   take one at night  of 300 mg   in past she was on higher dose and got cns side effects  Even at discharge  last hospital was to take once at night . When was it changed to  qid   and who did this ?   Maybe se if if Susan Ford can help and reach out since there seems to be confusion .

## 2023-08-15 NOTE — Telephone Encounter (Signed)
 Copied from CRM [id number]. Topic: Clinical - Medication Refill >> Aug 01, 2023  9:10 AM Ovid Blow wrote: Most Recent Primary Care Visit:  Provider: Dewayne Ford  Department: [facility]  Visit Type: MEDICARE AWV, SEQUENTIAL  Date: 06/15/2023  Medication: gabapentin  (NEURONTIN ) 300 MG capsule  Has the patient contacted their pharmacy? Yes (Agent: If no, request that the patient contact the pharmacy for the refill. If patient does not wish to contact the pharmacy document the reason why and proceed with request.) (Agent: If yes, when and what did the pharmacy advise?)  Is this the correct pharmacy for this prescription? Yes If no, delete pharmacy and type the correct one.  This is the patient's preferred pharmacy:  [id number] - [place], [place] - [address] AT [facility] [address] [place] [address] Phone: [phone] Fax: [phone]    Has the prescription been filled recently? No  Is the patient out of the medication? Yes  Has the patient been seen for an appointment in the last year OR does the patient have an upcoming appointment? Yes  Can we respond through MyChart? Yes  Agent: Please be advised that Rx refills may take up to 3 business days. We ask that you follow-up with your pharmacy. >> Aug 15, 2023  2:01 PM Kita Perish H wrote: Atlee Leach with [facility] is calling to follow up on refill request for patients gabapentin  (NEURONTIN ) 300 MG capsule  states patient has been out of the medication for 3 weeks. Call patient or Pattye Meda [phone]  [facility] [phone]

## 2023-08-16 NOTE — Telephone Encounter (Signed)
 Reviewed chart. Patient has been on gabapentin  300mg  QID dating back to at least July 2024. Looks like her previous dose concerns occurred when she was on 600mg  QID. When she was discharged from hospital, they had decreased it to 300mg  at bedtime.  Spoke with Shaunte at her facility as they have been dispensing her meds to her. She has been without her gabapentin  for 3 and a half weeks but when she first arrived back from hospital, they had been administering QID.   Would recommend prescribing one 300mg  capsule at bedtime per hospital discharge since patient has been off for nearly a month now, and can go up on the dose as needed and if still tolerating okay.

## 2023-08-17 ENCOUNTER — Ambulatory Visit: Admitting: Internal Medicine

## 2023-08-17 ENCOUNTER — Other Ambulatory Visit: Payer: Self-pay | Admitting: Internal Medicine

## 2023-08-17 ENCOUNTER — Encounter: Payer: Self-pay | Admitting: Internal Medicine

## 2023-08-17 VITALS — BP 126/80 | HR 93 | Temp 98.4°F | Ht 64.0 in | Wt 114.4 lb

## 2023-08-17 DIAGNOSIS — Z79899 Other long term (current) drug therapy: Secondary | ICD-10-CM | POA: Diagnosis not present

## 2023-08-17 DIAGNOSIS — R350 Frequency of micturition: Secondary | ICD-10-CM

## 2023-08-17 DIAGNOSIS — M19041 Primary osteoarthritis, right hand: Secondary | ICD-10-CM | POA: Diagnosis not present

## 2023-08-17 LAB — POCT URINALYSIS DIPSTICK
Bilirubin, UA: NEGATIVE
Blood, UA: NEGATIVE
Glucose, UA: NEGATIVE
Ketones, UA: NEGATIVE
Leukocytes, UA: NEGATIVE
Nitrite, UA: NEGATIVE
Protein, UA: NEGATIVE
Spec Grav, UA: 1.02 (ref 1.010–1.025)
Urobilinogen, UA: 0.2 U/dL
pH, UA: 6 (ref 5.0–8.0)

## 2023-08-17 MED ORDER — GABAPENTIN 300 MG PO CAPS: 300.0000 mg | ORAL_CAPSULE | Freq: Every day | ORAL | 0 refills | Status: DC

## 2023-08-17 MED ORDER — GABAPENTIN 300 MG PO CAPS: 300.0000 mg | ORAL_CAPSULE | Freq: Two times a day (BID) | ORAL | 1 refills | Status: DC

## 2023-08-17 NOTE — Addendum Note (Signed)
 Addended by: Quanisha Drewry on: 08/17/2023 02:51 PM   Modules accepted: Orders

## 2023-08-17 NOTE — Telephone Encounter (Signed)
 Spoke to pt and inform Rx is sent. No further action is needed.

## 2023-08-17 NOTE — Patient Instructions (Addendum)
 Not sure why   you are having  urinating  so frequent   as preliminary urine is clear  but will get specimen to check for infection. If you get worse symptoms in the meantime such as burning and pain  contact us  and we can begin antibiotic.    We send int gabapentin   to take twice a day . Since you think it can help hand arthritis pain .  Also can take tylenol  and topical antiinflammatory  ( Voltaren diclofenac 4 x per day)

## 2023-08-17 NOTE — Addendum Note (Signed)
 Addended by: Derica Leiber on: 08/17/2023 02:35 PM   Modules accepted: Orders

## 2023-08-17 NOTE — Addendum Note (Signed)
 Addended by: Brittni Hult on: 08/17/2023 09:14 AM   Modules accepted: Orders

## 2023-08-17 NOTE — Progress Notes (Signed)
 Chief Complaint  Patient presents with   Urinary Frequency    Pt c/o feeling have to urinate often for 48 hours.     HPI: Susan Ford 89 y.o. come in for change in urinating in past 1-2 days  no fever burning like a reg uti so not sure. No change meds , po  intake  feels a bi puny tired but no sig change in status .  Has been off gabapentin  since hospital last month   confusion as was on 4 x per day?    And at night at dc   Says r hand arthritis is bothersom and worse and t hinks the gaba may have been helping the pain of arthiritis   in addition to tylenol  and topical s ROS: See pertinent positives and negatives per HPI. No cp new sob  some jittery feeling? No tremor   Past Medical History:  Diagnosis Date   Arthritis    Cataract 2007   bilateral cat ext    Cholecystitis    Complication of anesthesia Feb 21, 2014   atrial fib after cholecystectomy, lasted a few hours, none since   Dysrhythmia 02-21-2014   after cholecystectomy had atrial fib for a few hours, none since   GERD (gastroesophageal reflux disease)    Had endoscopy no history of Barrett's   Hx of nonmelanoma skin cancer    Unspecified hereditary and idiopathic peripheral neuropathy     Family History  Problem Relation Age of Onset   Neurodegenerative disease Mother        Shy Drager died at 68   Alcohol  abuse Other    Heart disease Other     Social History   Socioeconomic History   Marital status: Widowed    Spouse name: Not on file   Number of children: 3   Years of education: 15 years   Highest education level: Associate degree: occupational, Scientist, product/process development, or vocational program  Occupational History   Occupation: Retired    Comment: Charity fundraiser  Tobacco Use   Smoking status: Former    Current packs/day: 0.00    Average packs/day: 0.3 packs/day for 7.0 years (1.8 ttl pk-yrs)    Types: Cigarettes    Start date: 04/26/1956    Quit date: 04/27/1963    Years since quitting: 60.3   Smokeless tobacco: Never   Vaping Use   Vaping status: Never Used  Substance and Sexual Activity   Alcohol  use: Yes    Alcohol /week: 7.0 standard drinks of alcohol     Types: 7 Glasses of wine per week    Comment: 1 glass of wine daily   Drug use: No    Types: Hydrocodone    Sexual activity: Yes  Other Topics Concern   Not on file  Social History Narrative   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          Social Drivers of Health   Financial Resource Strain: Low Risk  (06/15/2023)   Overall Financial Resource Strain (CARDIA)  Difficulty of Paying Living Expenses: Not hard at all  Food Insecurity: No Food Insecurity (07/06/2023)   Hunger Vital Sign    Worried About Running Out of Food in the Last Year: Never true    Ran Out of Food in the Last Year: Never true  Transportation Needs: No Transportation Needs (07/07/2023)   PRAPARE - Administrator, Civil Service (Medical): No    Lack of Transportation (Non-Medical): No  Physical Activity: Inactive (06/15/2023)   Exercise Vital Sign    Days of Exercise per Week: 0 days    Minutes of Exercise per Session: 0 min  Stress: No Stress Concern Present (06/15/2023)   [employer] of Occupational Health - Occupational Stress Questionnaire    Feeling of Stress : Not at all  Social Connections: Moderately Integrated (07/06/2023)   Social Connection and Isolation Panel [NHANES]    Frequency of Communication with Friends and Family: More than three times a week    Frequency of Social Gatherings with Friends and Family: More than three times a week    Attends Religious Services: More than 4  times per year    Active Member of [employer] or Organizations: Yes    Attends Banker Meetings: More than 4 times per year    Marital Status: Widowed    Outpatient Medications Prior to Visit  Medication Sig Dispense Refill   acetaminophen  (TYLENOL ) 325 MG tablet Take 650 mg by mouth every 6 (six) hours as needed for moderate pain (pain score 4-6) or mild pain (pain score 1-3).     Apoaequorin (PREVAGEN) 10 MG CAPS Take 10 mg by mouth daily.     Ascorbic Acid  (VITAMIN C ) 1000 MG tablet Take 1,000 mg by mouth daily.     Multiple Vitamin (MULTIVITAMIN) tablet Take 1 tablet by mouth daily after lunch.     senna (SENOKOT) 8.6 MG TABS tablet Take 1 tablet (8.6 mg total) by mouth daily. (Patient taking differently: Take 2 tablets by mouth every other day.) 120 tablet 0   simvastatin  (ZOCOR ) 10 MG tablet TAKE 1 TABLET BY MOUTH EVERY DAY (Patient taking differently: Take 10 mg by mouth daily.) 90 tablet 1   gabapentin  (NEURONTIN ) 300 MG capsule Take 1 capsule (300 mg total) by mouth at bedtime. 90 capsule 0   No facility-administered medications prior to visit.     EXAM:  BP 126/80 (BP Location: Right Arm, Patient Position: Sitting, Cuff Size: Normal)   Pulse 93   Temp 98.4 F (36.9 C) (Oral)   Ht 5\' 4"  (1.626 m)   Wt 114 lb 6.4 oz (51.9 kg)   SpO2 97%   BMI 19.64 kg/m   Body mass index is 19.64 kg/m.  GENERAL: vitals reviewed and listed above, alert, oriented, appears well hydrated and in no acute distress HEENT: atraumatic, conjunctiva  clear, no obvious abnormalities on inspection of external nose and ears   LUNGS: clear to auscultation bilaterally, no wheezes, rales or rhonchimaybe a few dry crackles at baser? , good air movement CV: HRRR, no clubbing cyanosis or sign peripheral edema nl cap refill  MS: moves all extremities without noticeable focal  abnormality Has roller walker  PSYCH: pleasant and cooperative, no obvious depression or anxiety Lab Results  Component  Value Date   WBC 9.6 08/10/2023   HGB 13.2 08/10/2023   HCT 39.5 08/10/2023   PLT 250.0 08/10/2023   GLUCOSE 92 08/10/2023   CHOL 150 05/05/2021   TRIG 191.0 (H) 05/05/2021  HDL 64.30 05/05/2021   LDLDIRECT 153 02/12/2009   LDLCALC 48 05/05/2021   ALT 10 08/10/2023   AST 20 08/10/2023   NA 134 (L) 08/10/2023   K 4.2 08/10/2023   CL 101 08/10/2023   CREATININE 0.66 08/10/2023   BUN 8 08/10/2023   CO2 25 08/10/2023   TSH 0.644 07/06/2023   INR 1.0 04/11/2023   HGBA1C 5.5 05/05/2021   BP Readings from Last 3 Encounters:  08/17/23 126/80  08/10/23 112/70  07/08/23 112/61   Wt Readings from Last 3 Encounters:  08/17/23 114 lb 6.4 oz (51.9 kg)  08/10/23 112 lb 6.4 oz (51 kg)  06/30/23 123 lb 14.4 oz (56.2 kg)   Ua clear and sg 1020 ASSESSMENT AND PLAN:  Discussed the following assessment and plan:  Increased urinary frequency - Plan: POC Urinalysis Dipstick, Urine Culture, Urine Culture  Medication management  Arthritis of right hand Sx new onset uncertain cause  ua clear  but plan ur cx in case  early uti . Had update labs last week and much better although no was 134   on no meds that would be obviously cause change and  denies excess water drinking .  Will go ahead and order bid gaba  300 because said it helped her pain in past . ( Although at  one point higher dose caused some jitteriness and se )    -Patient advised to return or notify health care team  if  new concerns arise.  Patient Instructions  Not sure why   you are having  urinating  so frequent   as preliminary urine is clear  but will get specimen to check for infection. If you get worse symptoms in the meantime such as burning and pain  contact us  and we can begin antibiotic.    We send int gabapentin   to take twice a day . Since you think it can help hand arthritis pain .  Also can take tylenol  and topical antiinflammatory  ( Voltaren diclofenac 4 x per day)    Tibor Lemmons K. Ivie Savitt M.D.

## 2023-08-18 LAB — URINE CULTURE
MICRO NUMBER:: 16365431
Result:: NO GROWTH
SPECIMEN QUALITY:: ADEQUATE

## 2023-08-19 ENCOUNTER — Other Ambulatory Visit: Payer: Self-pay | Admitting: Internal Medicine

## 2023-08-19 NOTE — Telephone Encounter (Signed)
 Called pharmacy and was advised medication was already refilled today.

## 2023-08-19 NOTE — Telephone Encounter (Signed)
 Copied from CRM [id number]. Topic: Clinical - Medication Refill >> Aug 19, 2023 10:28 AM Juluis Ok wrote: Most Recent Primary Care Visit:  Provider: Reginal Capra  Department: [facility]  Visit Type: OFFICE VISIT  Date: 08/10/2023  Medication: simvastatin  (ZOCOR ) 10 MG tablet  Has the patient contacted their pharmacy? No (Agent: If no, request that the patient contact the pharmacy for the refill. If patient does not wish to contact the pharmacy document the reason why and proceed with request.) (Agent: If yes, when and what did the pharmacy advise?)  Is this the correct pharmacy for this prescription? Yes If no, delete pharmacy and type the correct one.  This is the patient's preferred pharmacy:  [id number] - [place], [place] - [address] AT [facility] [address] [place] [address] Phone: [phone] Fax: [phone]     Has the prescription been filled recently? No  Is the patient out of the medication? Yes  Has the patient been seen for an appointment in the last year OR does the patient have an upcoming appointment? Yes  Can we respond through MyChart? No  Agent: Please be advised that Rx refills may take up to 3 business days. We ask that you follow-up with your pharmacy.

## 2023-08-22 ENCOUNTER — Encounter: Payer: Self-pay | Admitting: Internal Medicine

## 2023-08-22 NOTE — Progress Notes (Signed)
 No bacteria   in urine  and thus no UTI .

## 2023-09-01 NOTE — Progress Notes (Signed)
 Attempted to call pt. No answer.

## 2023-09-22 ENCOUNTER — Other Ambulatory Visit: Payer: Self-pay

## 2023-09-22 ENCOUNTER — Emergency Department (HOSPITAL_COMMUNITY)

## 2023-09-22 ENCOUNTER — Emergency Department (HOSPITAL_COMMUNITY)
Admission: EM | Admit: 2023-09-22 | Discharge: 2023-09-22 | Disposition: A | Discharge status: Home / Self Care | Attending: Emergency Medicine | Admitting: Emergency Medicine

## 2023-09-22 DIAGNOSIS — Z743 Need for continuous supervision: Secondary | ICD-10-CM | POA: Diagnosis not present

## 2023-09-22 DIAGNOSIS — K5900 Constipation, unspecified: Secondary | ICD-10-CM | POA: Insufficient documentation

## 2023-09-22 DIAGNOSIS — R1084 Generalized abdominal pain: Secondary | ICD-10-CM | POA: Diagnosis not present

## 2023-09-22 MED ORDER — FLEET ENEMA RE ENEM
1.0000 | ENEMA | Freq: Once | RECTAL | Status: AC
  Administered 2023-09-22: 1 via RECTAL
  Filled 2023-09-22: qty 1

## 2023-09-22 MED ORDER — ONDANSETRON 4 MG PO TBDP
4.0000 mg | ORAL_TABLET | Freq: Once | ORAL | Status: AC
  Administered 2023-09-22: 4 mg via ORAL
  Filled 2023-09-22: qty 1

## 2023-09-22 MED ORDER — MAGNESIUM CITRATE PO SOLN
1.0000 | Freq: Once | ORAL | Status: AC
  Administered 2023-09-22: 1 via ORAL
  Filled 2023-09-22: qty 296

## 2023-09-22 NOTE — ED Provider Notes (Signed)
 [place] EMERGENCY DEPARTMENT AT [facility] Provider Note   CSN: [id number] Arrival date & time: 09/22/23  4098     History  Chief Complaint  Patient presents with   Constipation    Pt presents from facility today with rectal pain. Had last BM this morning with digital manipulation, last BM three days ago, took 6 laxatives this morning to no affect. Per EMS no blood in stools. Denies pain otherwise    Susan Ford is a 89 y.o. female.  Who presents to the ED for constipation.  Patient reports ongoing constipation for last 3 days.  Last bowel movement was 3 days ago was normal without blood.  She has tried senna and manual disimpaction at home.  She was able to remove small amount of stool yesterday but still feels constipated.  No nausea or vomiting.  No fevers or chills.   Constipation      Home Medications Prior to Admission medications   Medication Sig Start Date End Date Taking? Authorizing Provider  acetaminophen  (TYLENOL ) 325 MG tablet Take 650 mg by mouth every 6 (six) hours as needed for moderate pain (pain score 4-6) or mild pain (pain score 1-3).    [provider]  Apoaequorin (PREVAGEN) 10 MG CAPS Take 10 mg by mouth daily.    [provider]  Ascorbic Acid  (VITAMIN C ) 1000 MG tablet Take 1,000 mg by mouth daily.    [provider]  gabapentin  (NEURONTIN ) 300 MG capsule Take 1 capsule (300 mg total) by mouth 2 (two) times daily. 08/17/23   Panosh, Wanda K, MD  Multiple Vitamin (MULTIVITAMIN) tablet Take 1 tablet by mouth daily after lunch.    [provider]  senna (SENOKOT) 8.6 MG TABS tablet Take 1 tablet (8.6 mg total) by mouth daily. Patient taking differently: Take 2 tablets by mouth every other day. 01/02/23   Lesa Rape, MD  simvastatin  (ZOCOR ) 10 MG tablet TAKE 1 TABLET BY MOUTH EVERY DAY Patient taking differently: Take 10 mg by mouth daily. 06/27/23   Panosh, Wanda K, MD      Allergies    Keflex   [cephalexin ], Lyrica [pregabalin], Oxytetracycline, and Memantine     Review of Systems   Review of Systems  Gastrointestinal:  Positive for constipation.    Physical Exam Updated Vital Signs BP (!) 151/105   Pulse 84   Temp 97.7 F (36.5 C) (Oral)   Resp 18   SpO2 97%  Physical Exam Vitals and nursing note reviewed.  HENT:     Head: Normocephalic and atraumatic.  Eyes:     Pupils: Pupils are equal, round, and reactive to light.  Cardiovascular:     Rate and Rhythm: Normal rate and regular rhythm.  Pulmonary:     Effort: Pulmonary effort is normal.     Breath sounds: Normal breath sounds.  Abdominal:     General: There is no distension.     Palpations: Abdomen is soft.     Tenderness: There is no abdominal tenderness.  Skin:    General: Skin is warm and dry.  Neurological:     Mental Status: She is alert.  Psychiatric:        Mood and Affect: Mood normal.     ED Results / Procedures / Treatments   Labs (all labs ordered are listed, but only abnormal results are displayed) Labs Reviewed - No data to display  EKG None  Radiology DG Abd Portable 1V Result Date: 09/22/2023 CLINICAL DATA:  Constipation.  EXAM: PORTABLE ABDOMEN - 1 VIEW COMPARISON:  07/06/2019.  Abdomen and pelvis CT dated 04/10/2023. FINDINGS: Normal bowel-gas pattern. No visible stool. Cholecystectomy clips. Stable lumbar spine degenerative changes and T11 and T12 compression deformities. No acute bony abnormality. IMPRESSION: Normal bowel-gas pattern. No visible stool. Electronically Signed   By: Catherin Closs M.D.   On: 09/22/2023 11:38    Procedures Procedures    Medications Ordered in ED Medications  magnesium  citrate solution 1 Bottle (1 Bottle Oral Given 09/22/23 1145)  sodium phosphate  (FLEET) enema 1 enema (1 enema Rectal Given 09/22/23 1049)    ED Course/ Medical Decision Making/ A&P Clinical Course as of 09/22/23 1221  Thu Sep 22, 2023  1220 No stool visualized on abdominal x-ray.   Patient had several small bowel movements here.  Appropriate for discharge with instruction for bowel regimen at home [MP]    Clinical Course User Index [MP] Sallyanne Creamer, DO                                 Medical Decision Making 89 year old female with history as above presenting for 3 days of constipation.  Took senna at home and tried manual disimpaction.  Abdomen soft nontender no nausea or vomiting.  Will obtain abdominal x-ray to evaluate for degree of constipation.  Will trial magnesium  citrate and Fleet enema here to see if we can relieve her constipation.  Low suspicion for bowel obstruction at this time  Amount and/or Complexity of Data Reviewed Radiology: ordered.  Risk OTC drugs.           Final Clinical Impression(s) / ED Diagnoses Final diagnoses:  Constipation, unspecified constipation type    Rx / DC Orders ED Discharge Orders     None         Sallyanne Creamer, DO 09/22/23 1222

## 2023-09-22 NOTE — ED Notes (Signed)
 Family updated as to patient's status.

## 2023-09-22 NOTE — ED Notes (Signed)
 Facility just called me to confirm pts purse is in her room

## 2023-09-22 NOTE — ED Notes (Signed)
 Gave report to [facility] RN at pt's living facility

## 2023-09-22 NOTE — ED Notes (Signed)
 Patient is resting comfortably.

## 2023-09-22 NOTE — ED Notes (Signed)
 Tech noticed pt was wet dried and changed pt

## 2023-09-22 NOTE — ED Notes (Signed)
 Spoke with patient who states she is not going home, says she feels worse and she is not ready to go home and is having nausea. I let the provider know who ordered her PO zofran . Pt still adamant she is not ready to go home. I will let provider know again

## 2023-09-22 NOTE — Discharge Instructions (Signed)
 You were seen in the emerged part for constipation You were able to have several small bowel movements after magnesium  citrate and a Fleet enema You should continue taking senna at home to achieve 1-2 soft bowel movements daily You can also try MiraLAX  to help keep stools soft and moving Follow-up with your primary care doctor in 1 week for reevaluation Return to the emergency department for severe constipation abdominal pain or any other concerns

## 2023-10-03 ENCOUNTER — Other Ambulatory Visit: Payer: Self-pay | Admitting: Internal Medicine

## 2023-10-03 NOTE — Telephone Encounter (Unsigned)
 Copied from CRM [id number]. Topic: Clinical - Medication Refill >> Oct 03, 2023 11:30 AM [name] wrote: Medication: gabapentin  (NEURONTIN ) 300 MG capsule Patient states she takes one in the morning and one in the evening.   Has the patient contacted their pharmacy? No (Agent: If no, request that the patient contact the pharmacy for the refill. If patient does not wish to contact the pharmacy document the reason why and proceed with request.) (Agent: If yes, when and what did the pharmacy advise?)  This is the patient's preferred pharmacy:  [id number] - [place], [place] - [address] AT [address] [place] [address] Phone: [phone] Fax: [phone]    Is this the correct pharmacy for this prescription? Yes If no, delete pharmacy and type the correct one.   Has the prescription been filled recently? Yes, patient   Is the patient out of the medication? Yes  Has the patient been seen for an appointment in the last year OR does the patient have an upcoming appointment? Yes  Can we respond through MyChart? Yes  Agent: Please be advised that Rx refills may take up to 3 business days. We ask that you follow-up with your pharmacy.

## 2023-10-04 MED ORDER — GABAPENTIN 300 MG PO CAPS: 300.0000 mg | ORAL_CAPSULE | Freq: Two times a day (BID) | ORAL | 0 refills | Status: DC

## 2023-10-06 ENCOUNTER — Ambulatory Visit: Payer: Self-pay

## 2023-10-06 NOTE — Telephone Encounter (Addendum)
 FYI Only or Action Required?: Action required by provider  Patient was last seen in primary care on 08/17/2023 by Panosh, Joaquim Muir, MD. Called Nurse Triage reporting No chief complaint on file.. Symptoms began several days ago. Interventions attempted: Nothing. Symptoms are: unchanged.  Triage Disposition: See PCP Within 2 Weeks (overriding See Physician Within 24 Hours)  Patient/caregiver understands and will follow disposition?: Yes    Please contact the patient regarding cessation of medication [phone]  Copied from CRM [id number]. Topic: Clinical - Red Word Triage >> Oct 06, 2023  1:37 PM Dewanda Foots wrote: Red Word that prompted transfer to Nurse Triage: Pt believes she is having negative side effects from her gabapentin  (NEURONTIN ) 300 MG capsule. States she is feeling washed out and not feeling good and wants to know why she is on this medication at all.  Requested to speak to a nurse regarding if she could stop taking the medication.  Reason for Disposition  Taking a medicine that could cause weakness (e.g., blood pressure medications, diuretics)  Answer Assessment - Initial Assessment Questions 1. DESCRIPTION: Describe how you are feeling.     Sleepy all the time  2. SEVERITY: How bad is it?  Can you stand and walk?   - MILD (0-3): Feels weak or tired, but does not interfere with work, school or normal activities.   - MODERATE (4-7): Able to stand and walk; weakness interferes with work, school, or normal activities.   - SEVERE (8-10): Unable to stand or walk; unable to do usual activities.     Moderate  3. ONSET: When did these symptoms begin? (e.g., hours, days, weeks, months)     2-3 Days  4. CAUSE: What do you think is causing the weakness or fatigue? (e.g., not drinking enough fluids, medical problem, trouble sleeping)     Gabapentin   5. NEW MEDICINES:  Have you started on any new medicines recently? (e.g., opioid pain medicines, benzodiazepines, muscle  relaxants, antidepressants, antihistamines, neuroleptics, beta blockers)     Gabapentin   6. OTHER SYMPTOMS: Do you have any other symptoms? (e.g., chest pain, fever, cough, SOB, vomiting, diarrhea, bleeding, other areas of pain)     No  7. PREGNANCY: Is there any chance you are pregnant? When was your last menstrual period?     No and No  Protocols used: Weakness (Generalized) and Fatigue-A-AH

## 2023-10-07 NOTE — Telephone Encounter (Signed)
 Attempted reach pt. Please enter remote access code phone drop.

## 2023-10-11 NOTE — Telephone Encounter (Signed)
 Son return our call. Inform him we are unable to reach out to pt and unable to leave voicemail. Ask son if he can have pt to return our call. Son states his mom been trying to reach out to him and recommends to call pt around this time.   Attempted reach pt again. No success.

## 2023-10-11 NOTE — Telephone Encounter (Signed)
 Attempted to reach pt. Please enter remote access code Attempted to pt's son on PDR. Left a voicemail to call us  back.

## 2023-10-17 ENCOUNTER — Telehealth: Payer: Self-pay

## 2023-10-17 NOTE — Telephone Encounter (Signed)
 Copied from CRM [id number]. Topic: General - Other >> Oct 17, 2023  8:19 AM Willma R wrote: Reason for CRM: Patient scheduled for a 3 month follow up on 11/09/23. Patient would like a call back to discuss what she needs to come in to follow up on.  Patient can be reached at [phone]

## 2023-10-17 NOTE — Telephone Encounter (Signed)
 Attempted to reach pt. Unsuccess. Please enter access code. will try again.

## 2023-10-18 ENCOUNTER — Ambulatory Visit: Admitting: Internal Medicine

## 2023-10-21 NOTE — Telephone Encounter (Signed)
 Atttempted to reach pt.  Please enter access code  Reach out to pt's son. He said they are out for lunch. Inform him to have pt to call us  back. Son, Mr. Lionell, said he will have pt call us  back once she is at her apartment.

## 2023-11-09 ENCOUNTER — Encounter: Payer: Self-pay | Admitting: Internal Medicine

## 2023-11-09 ENCOUNTER — Ambulatory Visit (INDEPENDENT_AMBULATORY_CARE_PROVIDER_SITE_OTHER): Admitting: Internal Medicine

## 2023-11-09 VITALS — BP 127/74 | HR 90 | Temp 98.3°F | Ht 64.0 in | Wt 114.4 lb

## 2023-11-09 DIAGNOSIS — Z862 Personal history of diseases of the blood and blood-forming organs and certain disorders involving the immune mechanism: Secondary | ICD-10-CM

## 2023-11-09 DIAGNOSIS — Z79899 Other long term (current) drug therapy: Secondary | ICD-10-CM | POA: Diagnosis not present

## 2023-11-09 DIAGNOSIS — Z86711 Personal history of pulmonary embolism: Secondary | ICD-10-CM

## 2023-11-09 DIAGNOSIS — E559 Vitamin D deficiency, unspecified: Secondary | ICD-10-CM | POA: Diagnosis not present

## 2023-11-09 DIAGNOSIS — R54 Age-related physical debility: Secondary | ICD-10-CM

## 2023-11-09 DIAGNOSIS — R350 Frequency of micturition: Secondary | ICD-10-CM | POA: Diagnosis not present

## 2023-11-09 DIAGNOSIS — G8929 Other chronic pain: Secondary | ICD-10-CM

## 2023-11-09 DIAGNOSIS — M545 Low back pain, unspecified: Secondary | ICD-10-CM

## 2023-11-09 DIAGNOSIS — R4189 Other symptoms and signs involving cognitive functions and awareness: Secondary | ICD-10-CM

## 2023-11-09 LAB — CBC WITH DIFFERENTIAL/PLATELET
Basophils Absolute: 0.1 K/uL (ref 0.0–0.1)
Basophils Relative: 1.3 % (ref 0.0–3.0)
Eosinophils Absolute: 0.4 K/uL (ref 0.0–0.7)
Eosinophils Relative: 6.4 % — ABNORMAL HIGH (ref 0.0–5.0)
HCT: 41.8 % (ref 36.0–46.0)
Hemoglobin: 13.9 g/dL (ref 12.0–15.0)
Lymphocytes Relative: 22.3 % (ref 12.0–46.0)
Lymphs Abs: 1.5 K/uL (ref 0.7–4.0)
MCHC: 33.3 g/dL (ref 30.0–36.0)
MCV: 92.6 fl (ref 78.0–100.0)
Monocytes Absolute: 0.5 K/uL (ref 0.1–1.0)
Monocytes Relative: 7 % (ref 3.0–12.0)
Neutro Abs: 4.1 K/uL (ref 1.4–7.7)
Neutrophils Relative %: 63 % (ref 43.0–77.0)
Platelets: 283 K/uL (ref 150.0–400.0)
RBC: 4.51 Mil/uL (ref 3.87–5.11)
RDW: 13.2 % (ref 11.5–15.5)
WBC: 6.6 K/uL (ref 4.0–10.5)

## 2023-11-09 LAB — BASIC METABOLIC PANEL WITH GFR
BUN: 9 mg/dL (ref 6–23)
CO2: 27 meq/L (ref 19–32)
Calcium: 9.4 mg/dL (ref 8.4–10.5)
Chloride: 98 meq/L (ref 96–112)
Creatinine, Ser: 0.64 mg/dL (ref 0.40–1.20)
GFR: 76.75 mL/min (ref >60.00)
Glucose, Bld: 100 mg/dL — ABNORMAL HIGH (ref 70–99)
Potassium: 3.9 meq/L (ref 3.5–5.1)
Sodium: 133 meq/L — ABNORMAL LOW (ref 135–145)

## 2023-11-09 LAB — VITAMIN D 25 HYDROXY (VIT D DEFICIENCY, FRACTURES): VITD: 64.08 ng/mL (ref 30.00–100.00)

## 2023-11-09 LAB — LIPID PANEL
Cholesterol: 165 mg/dL (ref 0–200)
HDL: 73.3 mg/dL (ref >39.00)
LDL Cholesterol: 70 mg/dL (ref 0–99)
NonHDL: 91.99
Total CHOL/HDL Ratio: 2
Triglycerides: 110 mg/dL (ref 0.0–149.0)
VLDL: 22 mg/dL (ref 0.0–40.0)

## 2023-11-09 LAB — HEMOGLOBIN A1C: Hgb A1c MFr Bld: 5.8 % (ref 4.6–6.5)

## 2023-11-09 NOTE — Patient Instructions (Addendum)
 Good to see you today .   BP is good .  Continue  healthy nutrition.   Healthy protein  and supplements .   Urine frequency  may not be infection sometimes a irritable bladder    Sometimes  medication can be tried.   I agree stay off of the gabapentin  for now .  Not sure  the side effects  are worth it .  Used for  nerve pain  but can cause fogginess and  risk of falling.

## 2023-11-09 NOTE — Progress Notes (Signed)
 Chief Complaint  Patient presents with   Follow-up    Patient states this is a yearly check up. Patient states now she is going to the bathroom more frequently. Patient has a question about her gabapentin .     HPI: Susan Ford 89 y.o. come in for Chronic disease management  and yearly check  Is a resident at [facility].  Back pain   can sleep ok getting  to sleep  sometimes hard.   Salon pas    nos leeping on heating pad.  S Off the gaba for a few months   had caused  some fogginess.   Breathing   no cp  falling.   Now off anticoagulant   no bleeding  Nutrition  dining hall for dinner  Recently using  boost . Lunch / breakfast  coffee and  sugar cookies.  Uses ppill box for meds  some in am and some pm using prevagen for memory . ( Had se of namenda )  ROS: See pertinent positives and negatives per HPI. Using roller walker pretty well and denies new problems   Past Medical History:  Diagnosis Date   Arthritis    Cataract 2007   bilateral cat ext    Cholecystitis    Complication of anesthesia Feb 21, 2014   atrial fib after cholecystectomy, lasted a few hours, none since   Dysrhythmia 02-21-2014   after cholecystectomy had atrial fib for a few hours, none since   GERD (gastroesophageal reflux disease)    Had endoscopy no history of Barrett's   Hx of nonmelanoma skin cancer    Unspecified hereditary and idiopathic peripheral neuropathy     Family History  Problem Relation Age of Onset   Neurodegenerative disease Mother        Shy Drager died at 45   Alcohol  abuse Other    Heart disease Other     Social History   Socioeconomic History   Marital status: Widowed    Spouse name: Not on file   Number of children: 3   Years of education: 15 years   Highest education level: Associate degree: occupational, Scientist, product/process development, or vocational program  Occupational History   Occupation: Retired    Comment: Charity fundraiser  Tobacco Use   Smoking status: Former    Current packs/day:  0.00    Average packs/day: 0.3 packs/day for 7.0 years (1.8 ttl pk-yrs)    Types: Cigarettes    Start date: 04/26/1956    Quit date: 04/27/1963    Years since quitting: 60.5   Smokeless tobacco: Never  Vaping Use   Vaping status: Never Used  Substance and Sexual Activity   Alcohol  use: Yes    Alcohol /week: 7.0 standard drinks of alcohol     Types: 7 Glasses of wine per week    Comment: 1 glass of wine daily   Drug use: No    Types: Hydrocodone    Sexual activity: Yes  Other Topics Concern   Not on file  Social History Narrative   Retired Charity fundraiser    widowed since    Spring  17    [facility] of [facility]   Regular exercise former smoker   G3P3   From [place] originally      05/12/2018:   Lives alone on ranch house, but has stairs to garage with Medical laboratory scientific officer    Son in [place], and two daughters out of state, all of whom are supportive    Worked in NICU, 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 Misses working the floor.          Social Drivers of Corporate investment banker Strain: Low Risk  (06/15/2023)   Overall Financial Resource Strain (CARDIA)    Difficulty of Paying Living Expenses: Not hard at all  Food Insecurity: No Food Insecurity (07/06/2023)   Hunger Vital Sign    Worried About Running Out of Food in the Last Year: Never true    Ran Out of Food in the Last Year: Never true  Transportation Needs: No Transportation Needs (07/07/2023)   PRAPARE - Administrator, Civil Service (Medical): No    Lack of Transportation (Non-Medical): No  Physical Activity: Inactive (06/15/2023)   Exercise Vital Sign    Days of Exercise per Week: 0 days    Minutes of Exercise per Session: 0 min  Stress: No Stress Concern Present (06/15/2023)   [employer] of Occupational Health - Occupational Stress Questionnaire    Feeling of Stress : Not at all  Social Connections: Moderately Integrated (07/06/2023)   Social Connection and Isolation Panel    Frequency of  Communication with Friends and Family: More than three times a week    Frequency of Social Gatherings with Friends and Family: More than three times a week    Attends Religious Services: More than 4 times per year    Active Member of [employer] or Organizations: Yes    Attends Banker Meetings: More than 4 times per year    Marital Status: Widowed    Outpatient Medications Prior to Visit  Medication Sig Dispense Refill   acetaminophen  (TYLENOL ) 325 MG tablet Take 650 mg by mouth every 6 (six) hours as needed for moderate pain (pain score 4-6) or mild pain (pain score 1-3).     Apoaequorin (PREVAGEN) 10 MG CAPS Take 10 mg by mouth daily.     Ascorbic Acid  (VITAMIN C ) 1000 MG tablet Take 1,000 mg by mouth daily.     gabapentin  (NEURONTIN ) 300 MG capsule Take 1 capsule (300 mg total) by mouth 2 (two) times daily. 180 capsule 0   Multiple Vitamin (MULTIVITAMIN) tablet Take 1 tablet by mouth daily after lunch.     senna (SENOKOT) 8.6 MG TABS tablet Take 1 tablet (8.6 mg total) by mouth daily. 120 tablet 0   simvastatin  (ZOCOR ) 10 MG tablet TAKE 1 TABLET BY MOUTH EVERY DAY 90 tablet 1   No facility-administered medications prior to visit.     EXAM:  BP 127/74   Pulse 90   Temp 98.3 F (36.8 C) (Oral)   Ht 5' 4 (1.626 m)   Wt 114 lb 6.4 oz (51.9 kg)   SpO2 96%   BMI 19.64 kg/m   Body mass index is 19.64 kg/m. Wt Readings from Last 3 Encounters:  11/09/23 114 lb 6.4 oz (51.9 kg)  08/17/23 114 lb 6.4 oz (51.9 kg)  08/10/23 112 lb 6.4 oz (51 kg)    GENERAL: vitals reviewed and listed above, alert, oriented, somewhat frail   appears well hydrated and in no acute distress HEENT: atraumatic, conjunctiva  clear, no obvious abnormalities on inspection of external nose and ears OP : no lesion edema or exudate  NECK: no obvious masses on inspection palpation  LUNGS: clear to auscultation bilaterally, no wheezes, rales or rhonchi, good air movement  kyphosis  CV: HRRR, no  clubbing cyanosis or  peripheral edema nl cap refill  MS: moves all extremities without noticeable focal  abnormality some arthritis  changes  Arises from chair independently and uses walker  easily  maneuvers ( could do without )  non focal exam  PSYCH: pleasant and cooperative, no obvious depression or anxiety cognition not formally tested today  Lab Results  Component Value Date   WBC 6.6 11/09/2023   HGB 13.9 11/09/2023   HCT 41.8 11/09/2023   PLT 283.0 11/09/2023   GLUCOSE 100 (H) 11/09/2023   CHOL 165 11/09/2023   TRIG 110.0 11/09/2023   HDL 73.30 11/09/2023   LDLDIRECT 153 02/12/2009   LDLCALC 70 11/09/2023   ALT 10 08/10/2023   AST 20 08/10/2023   NA 133 (L) 11/09/2023   K 3.9 11/09/2023   CL 98 11/09/2023   CREATININE 0.64 11/09/2023   BUN 9 11/09/2023   CO2 27 11/09/2023   TSH 0.644 07/06/2023   INR 1.0 04/11/2023   HGBA1C 5.8 11/09/2023   BP Readings from Last 3 Encounters:  11/09/23 127/74  09/22/23 (!) 151/105  08/17/23 126/80  Last vitamin D  Lab Results  Component Value Date   VD25OH 64.08 11/09/2023     ASSESSMENT AND PLAN:  Discussed the following assessment and plan:  Medication management - Plan: Basic metabolic panel with GFR, CBC with Differential/Platelet, Hemoglobin A1c, Lipid panel, Vitamin D , 25-hydroxy, CANCELED: Basic metabolic panel with GFR, CANCELED: CBC with Differential/Platelet, CANCELED: Hemoglobin A1c, CANCELED: Lipid panel, CANCELED: Vitamin D , 25-hydroxy  Increased urinary frequency - Plan: Basic metabolic panel with GFR, CBC with Differential/Platelet, Hemoglobin A1c, Lipid panel, Vitamin D , 25-hydroxy, Urinalysis, Routine w reflex microscopic, CANCELED: Basic metabolic panel with GFR, CANCELED: CBC with Differential/Platelet, CANCELED: Hemoglobin A1c, CANCELED: Lipid panel, CANCELED: Vitamin D , 25-hydroxy  Advanced age - Plan: Basic metabolic panel with GFR, CBC with Differential/Platelet, Hemoglobin A1c, Lipid panel, Vitamin D ,  25-hydroxy, CANCELED: Basic metabolic panel with GFR, CANCELED: CBC with Differential/Platelet, CANCELED: Hemoglobin A1c, CANCELED: Lipid panel, CANCELED: Vitamin D , 25-hydroxy  Vitamin D  deficiency - Plan: Basic metabolic panel with GFR, CBC with Differential/Platelet, Hemoglobin A1c, Lipid panel, Vitamin D , 25-hydroxy, CANCELED: Basic metabolic panel with GFR, CANCELED: CBC with Differential/Platelet, CANCELED: Hemoglobin A1c, CANCELED: Lipid panel, CANCELED: Vitamin D , 25-hydroxy  Chronic midline low back pain without sciatica - Plan: Basic metabolic panel with GFR, CBC with Differential/Platelet, Hemoglobin A1c, Lipid panel, Vitamin D , 25-hydroxy, CANCELED: Basic metabolic panel with GFR, CANCELED: CBC with Differential/Platelet, CANCELED: Hemoglobin A1c, CANCELED: Lipid panel, CANCELED: Vitamin D , 25-hydroxy  History of pulmonary embolism - Plan: Basic metabolic panel with GFR, CBC with Differential/Platelet, Hemoglobin A1c, Lipid panel, Vitamin D , 25-hydroxy, CANCELED: Basic metabolic panel with GFR, CANCELED: CBC with Differential/Platelet, CANCELED: Hemoglobin A1c, CANCELED: Lipid panel, CANCELED: Vitamin D , 25-hydroxy  History of anemia - Plan: Basic metabolic panel with GFR, CBC with Differential/Platelet, Hemoglobin A1c, Lipid panel, Vitamin D , 25-hydroxy, CANCELED: Basic metabolic panel with GFR, CANCELED: CBC with Differential/Platelet, CANCELED: Hemoglobin A1c, CANCELED: Lipid panel, CANCELED: Vitamin D , 25-hydroxy  Cognitive decline - Plan: Basic metabolic panel with GFR, CBC with Differential/Platelet, Hemoglobin A1c, Lipid panel, Vitamin D , 25-hydroxy  Chronic bilateral low back pain without sciatica Plan update labs  and if ok 6 mos check at this time I think staying off gabapentin  and se  issues possible  if needed we may use low dose at night  Bladder frequency prob not infection  consider  empiric med to see if helps such as myrbetric or gemtesa  Review exam counsel plan  45  minutes  -Patient advised to return or notify health care team  if  new concerns arise.  Patient Instructions  Good to see you today .   BP is good .  Continue  healthy nutrition.   Healthy protein  and supplements .   Urine frequency  may not be infection sometimes a irritable bladder    Sometimes  medication can be tried.   I agree stay off of the gabapentin  for now .  Not sure  the side effects  are worth it .  Used for  nerve pain  but can cause fogginess and  risk of falling.   Opie Maclaughlin K. Waldron Gerry M.D.

## 2023-11-14 ENCOUNTER — Other Ambulatory Visit: Payer: Self-pay | Admitting: Internal Medicine

## 2023-11-28 ENCOUNTER — Ambulatory Visit: Payer: Self-pay | Admitting: Internal Medicine

## 2023-11-28 DIAGNOSIS — E871 Hypo-osmolality and hyponatremia: Secondary | ICD-10-CM

## 2023-11-28 NOTE — Progress Notes (Signed)
 Cholesterol panel is good No diabetes  or anemia  Kidney function and vit d normal  Sodium level  is still borderline low  will follow  up next visit

## 2023-11-30 DIAGNOSIS — K080 Exfoliation of teeth due to systemic causes: Secondary | ICD-10-CM | POA: Diagnosis not present

## 2024-01-25 ENCOUNTER — Emergency Department (HOSPITAL_COMMUNITY)
Admission: EM | Admit: 2024-01-25 | Discharge: 2024-01-26 | Disposition: A | Discharge status: Skilled Nursing Facility | Source: Skilled Nursing Facility | Attending: Emergency Medicine | Admitting: Emergency Medicine

## 2024-01-25 ENCOUNTER — Emergency Department (HOSPITAL_COMMUNITY)

## 2024-01-25 ENCOUNTER — Other Ambulatory Visit: Payer: Self-pay

## 2024-01-25 ENCOUNTER — Encounter (HOSPITAL_COMMUNITY): Payer: Self-pay | Admitting: Emergency Medicine

## 2024-01-25 DIAGNOSIS — R1032 Left lower quadrant pain: Secondary | ICD-10-CM | POA: Diagnosis present

## 2024-01-25 DIAGNOSIS — K439 Ventral hernia without obstruction or gangrene: Secondary | ICD-10-CM | POA: Diagnosis not present

## 2024-01-25 DIAGNOSIS — R1084 Generalized abdominal pain: Secondary | ICD-10-CM | POA: Diagnosis not present

## 2024-01-25 DIAGNOSIS — S32591D Other specified fracture of right pubis, subsequent encounter for fracture with routine healing: Secondary | ICD-10-CM | POA: Diagnosis not present

## 2024-01-25 DIAGNOSIS — K5730 Diverticulosis of large intestine without perforation or abscess without bleeding: Secondary | ICD-10-CM | POA: Diagnosis not present

## 2024-01-25 DIAGNOSIS — K4090 Unilateral inguinal hernia, without obstruction or gangrene, not specified as recurrent: Secondary | ICD-10-CM | POA: Diagnosis not present

## 2024-01-25 LAB — COMPREHENSIVE METABOLIC PANEL WITH GFR
ALT: 9 U/L (ref 0–44)
AST: 19 U/L (ref 15–41)
Albumin: 4 g/dL (ref 3.5–5.0)
Alkaline Phosphatase: 99 U/L (ref 38–126)
Anion gap: 11 (ref 5–15)
BUN: 10 mg/dL (ref 8–23)
CO2: 25 mmol/L (ref 22–32)
Calcium: 9.4 mg/dL (ref 8.9–10.3)
Chloride: 99 mmol/L (ref 98–111)
Creatinine, Ser: 0.66 mg/dL (ref 0.44–1.00)
GFR, Estimated: >60 mL/min (ref >60)
Glucose, Bld: 106 mg/dL — ABNORMAL HIGH (ref 70–99)
Potassium: 3.8 mmol/L (ref 3.5–5.1)
Sodium: 135 mmol/L (ref 135–145)
Total Bilirubin: 0.4 mg/dL (ref 0.0–1.2)
Total Protein: 6.7 g/dL (ref 6.5–8.1)

## 2024-01-25 LAB — CBC
HCT: 37 % (ref 36.0–46.0)
Hemoglobin: 12.3 g/dL (ref 12.0–15.0)
MCH: 30.4 pg (ref 26.0–34.0)
MCHC: 33.2 g/dL (ref 30.0–36.0)
MCV: 91.6 fL (ref 80.0–100.0)
Platelets: 243 K/uL (ref 150–400)
RBC: 4.04 MIL/uL (ref 3.87–5.11)
RDW: 12.1 % (ref 11.5–15.5)
WBC: 8.8 K/uL (ref 4.0–10.5)
nRBC: 0 % (ref 0.0–0.2)

## 2024-01-25 LAB — LIPASE, BLOOD: Lipase: 30 U/L (ref 11–51)

## 2024-01-25 MED ORDER — ONDANSETRON HCL 4 MG/2ML IJ SOLN
4.0000 mg | Freq: Once | INTRAMUSCULAR | Status: AC
  Administered 2024-01-25: 4 mg via INTRAVENOUS
  Filled 2024-01-25: qty 2

## 2024-01-25 MED ORDER — IOHEXOL 300 MG/ML  SOLN
100.0000 mL | Freq: Once | INTRAMUSCULAR | Status: AC | PRN
  Administered 2024-01-25: 80 mL via INTRAVENOUS

## 2024-01-25 MED ORDER — MORPHINE SULFATE (PF) 4 MG/ML IV SOLN
4.0000 mg | Freq: Once | INTRAVENOUS | Status: AC
  Administered 2024-01-25: 4 mg via INTRAVENOUS
  Filled 2024-01-25: qty 1

## 2024-01-25 NOTE — ED Provider Notes (Signed)
 [place] EMERGENCY DEPARTMENT AT [facility] Provider Note   CSN: [id number] Arrival date & time: 01/25/24  2211     Patient presents with: Abdominal Pain   Susan Ford is a 89 y.o. female.   Patient has a PMH of HLD, GI bleed came in with a chief complaint of LLQ abdominal pain.  Patient was brought in by EMS from assisted living.  Her left lower quadrant started about 5 to 6 hours back.  She denies any nausea, vomiting, changes in bowel movements.  Denies dysuria and other urinary symptoms.  Patient denies blood in stool or urine.  She denies fevers, chills, CP, SOB, HA.  Patient said that she has not had anything like this before.  She ate what was served at her facility and did not feel different right after her meals. She is able to eat without feeling nauseous or throwing up.    Abdominal Pain      Prior to Admission medications   Medication Sig Start Date End Date Taking? Authorizing Provider  acetaminophen  (TYLENOL ) 325 MG tablet Take 650 mg by mouth every 6 (six) hours as needed for moderate pain (pain score 4-6) or mild pain (pain score 1-3).    [provider]  Apoaequorin (PREVAGEN) 10 MG CAPS Take 10 mg by mouth daily.    [provider]  Ascorbic Acid  (VITAMIN C ) 1000 MG tablet Take 1,000 mg by mouth daily.    [provider]  gabapentin  (NEURONTIN ) 300 MG capsule TAKE 1 CAPSULE BY MOUTH EVERYDAY AT BEDTIME 11/14/23   Panosh, Wanda K, MD  Multiple Vitamin (MULTIVITAMIN) tablet Take 1 tablet by mouth daily after lunch.    [provider]  senna (SENOKOT) 8.6 MG TABS tablet Take 1 tablet (8.6 mg total) by mouth daily. 01/02/23   Christobal Guadalajara, MD  simvastatin  (ZOCOR ) 10 MG tablet TAKE 1 TABLET BY MOUTH EVERY DAY 06/27/23   Panosh, Wanda K, MD    Allergies: Keflex  [cephalexin ], Lyrica [pregabalin], Oxytetracycline, and Memantine     Review of Systems  Reason unable to perform ROS: All pertinent ROS in HPI, MDM.   Gastrointestinal:  Positive for abdominal pain.    Updated Vital Signs BP (!) 164/90   Pulse 80   Temp 98.5 F (36.9 C) (Oral)   Resp 16   SpO2 97%   Physical Exam HENT:     Head: Normocephalic.  Cardiovascular:     Rate and Rhythm: Normal rate.  Pulmonary:     Effort: Pulmonary effort is normal.     Breath sounds: Normal breath sounds.  Abdominal:     Comments: LLQ tenderness on palpation. No distension but band like-tightness noted in the area of LLQ.   Musculoskeletal:     Comments: No pain in the Right pelvic region. Good ROM in right hip  Neurological:     Mental Status: She is alert.     (all labs ordered are listed, but only abnormal results are displayed) Labs Reviewed  LIPASE, BLOOD  COMPREHENSIVE METABOLIC PANEL WITH GFR  CBC  URINALYSIS, ROUTINE W REFLEX MICROSCOPIC    EKG: None  Radiology: No results found.   Procedures   Medications Ordered in the ED - No data to display                                 Medical Decision Making Pt came in with a c/o of LLQ abdominal  pain. DD includes diverticulitis, gastritis, incarcerated hernia. Her lab work was unremarkable. CT abdomen/pelvis showed unchanged herniation of colon in the LLQ along with subacute fracture of right inferior pubic ramus. Pt had a great ROM in the right leg with no pain/tenderness in the right pelvic region. She received zofran  and morphine  for her nausea and LLQ pain.  Disposition: Care assumed by Dr. Griselda during sign-out; awaiting UA results   Amount and/or Complexity of Data Reviewed Labs: ordered. Radiology: ordered.  Risk Prescription drug management.    Final diagnoses:  None    ED Discharge Orders     None          Edgardo Pontiff, DO 01/25/24 2356    Pamella Ozell LABOR, DO 02/02/24 1725

## 2024-01-25 NOTE — ED Triage Notes (Addendum)
 Pt BIB EMS from [facility].  Pt reports LLQ pain with nausea x several hours this evening. No vomiting. No diarrhea.  VSS  CBG 137

## 2024-01-26 ENCOUNTER — Telehealth (HOSPITAL_COMMUNITY): Payer: Self-pay | Admitting: Emergency Medicine

## 2024-01-26 LAB — URINALYSIS, ROUTINE W REFLEX MICROSCOPIC
Bilirubin Urine: NEGATIVE
Glucose, UA: NEGATIVE mg/dL
Hgb urine dipstick: NEGATIVE
Ketones, ur: NEGATIVE mg/dL
Leukocytes,Ua: NEGATIVE
Nitrite: NEGATIVE
Protein, ur: NEGATIVE mg/dL
Specific Gravity, Urine: 1.033 — ABNORMAL HIGH (ref 1.005–1.030)
pH: 7 (ref 5.0–8.0)

## 2024-01-26 NOTE — ED Notes (Signed)
 Pt found up standing in doorway of room having removed her IV. Pt re oriented to surroundings, assisted to restroom, and back to bed.  Bed locked in lowest position, both side rails up, all needs addressed.  Pt given call bell with instructions

## 2024-01-26 NOTE — ED Notes (Signed)
Called [facility] for transport.  

## 2024-01-26 NOTE — ED Provider Notes (Signed)
 Care assumed 2300. Patient here for evaluation of left lower quadrant abdominal pain. Care assume pending urinalysis.  Urinalysis is unremarkable. Patient reports her pain is improved but she does have some focal tenderness over her hernia, this is soft and easily reduced. Given her recent pain medication she was observed for several hours in the emergency department.  On multiple repeat assessments she denies any pain, she can ambulate without any recurrent pain. Hernia continues to be soft and easily reduced. She is taking PO without difficulty. Current picture is not consistent with acute incarcerated hernia or recent incarceration. Discussed with patient son over the phone findings of studies and need for her to return if there are any issues.   Susan Norris, MD 01/26/24 [id number]

## 2024-01-26 NOTE — Telephone Encounter (Signed)
 Patient's son called for clarification about the patient's diagnosis of the ED last night.  I clarified with him that I was not the attending who saw the patient in the ED, therefore my observations are based directly on her workup notes.  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  It seems that she was stable for discharge and there was no emergent indication for surgery.  I did provide the patient a general surgeons office number, as he says this was not provided in the paperwork.  He is quite comfortable with avoiding surgery, given her advanced age.  But again we discussed return precautions for evidence of obstruction or incarceration.

## 2024-01-31 ENCOUNTER — Telehealth: Admitting: Physician Assistant

## 2024-01-31 ENCOUNTER — Ambulatory Visit: Payer: Self-pay

## 2024-01-31 DIAGNOSIS — U071 COVID-19: Secondary | ICD-10-CM

## 2024-01-31 MED ORDER — NIRMATRELVIR/RITONAVIR (PAXLOVID)TABLET: 3.0000 | ORAL_TABLET | Freq: Two times a day (BID) | ORAL | 0 refills | Status: AC

## 2024-01-31 NOTE — Progress Notes (Signed)
 Virtual Visit Consent   [name], you are scheduled for a virtual visit with a [facility] provider today. Just as with appointments in the office, your consent must be obtained to participate. Your consent will be active for this visit and any virtual visit you may have with one of our providers in the next 365 days. If you have a MyChart account, a copy of this consent can be sent to you electronically.  As this is a virtual visit, video technology does not allow for your provider to perform a traditional examination. This may limit your provider's ability to fully assess your condition. If your provider identifies any concerns that need to be evaluated in person or the need to arrange testing (such as labs, EKG, etc.), we will make arrangements to do so. Although advances in technology are sophisticated, we cannot ensure that it will always work on either your end or our end. If the connection with a video visit is poor, the visit may have to be switched to a telephone visit. With either a video or telephone visit, we are not always able to ensure that we have a secure connection.  By engaging in this virtual visit, you consent to the provision of healthcare and authorize for your insurance to be billed (if applicable) for the services provided during this visit. Depending on your insurance coverage, you may receive a charge related to this service.  I need to obtain your verbal consent now. Are you willing to proceed with your visit today? [name] has provided verbal consent on 01/31/2024 for a virtual visit (video or telephone). Susan Ford, [place]  Date: 01/31/2024 6:37 PM   Virtual Visit via Video Note   I, Susan Ford, connected with  MEYGAN KYSER  ([id number], [birth year]) on 01/31/24 at  6:30 PM EDT by a video-enabled telemedicine application and verified that I am speaking with the correct person using two identifiers.  Location: Patient: Virtual  Visit Location Patient: Home Provider: Virtual Visit Location Provider: Home Office   I discussed the limitations of evaluation and management by telemedicine and the availability of in person appointments. The patient expressed understanding and agreed to proceed.    History of Present Illness: Susan CONRADT is a 89 y.o. who identifies as a female who was assigned female at birth, and is being seen today for COVID-19. Endorses symptom onset yesterday with fatigue, cough and muscle aches. Notes SOB, mild, with movement only. Denies fever, chills. Was tested this morning at her assisted living community, testing positive. Unsure of any sick contacts. Notes even though symptoms are milder, she wanted to speak to someone about antiviral medications cause she was told with her age she should take one.     HPI: HPI  Problems:  Patient Active Problem List   Diagnosis Date Noted   Influenza A 07/08/2023   Physical deconditioning 07/06/2023   ABLA (acute blood loss anemia) 08-May-2023   History of bleeding peptic ulcer 05-08-2023   History of pulmonary embolism, 12/28/22 2023/05/08   Chronic anticoagulation 2023-05-08   Acute GI bleeding 2023/05/08   Acute UTI 2023-05-08   Pulmonary embolism (HCC) 12/28/2022   Diarrhea 12/28/2022   Melena, with acute blood loss anemia 07/08/2022   Dementia without behavioral disturbance (HCC) 10/03/2017   Gait abnormality 06/29/2016   Chronic bilateral low back pain without sciatica 06/29/2016   Memory loss 11/10/2015   Death of husband 05/07/14   Bereavement due to life event 05-07-2014  Absolute anemia 04/08/2014   Atrial flutter (HCC) 02/23/2014   Acute calculous cholecystitis 02/21/2014   Spinal stenosis of lumbar region 01/04/2014   Recent bereavement 10/25/2013   Arthritis 10/25/2013   Hyperlipidemia 10/23/2013   Hereditary and idiopathic peripheral neuropathy    Nausea alone 07/14/2012   Neuropathy, peripheral 05/05/2011   High risk  medication use 05/05/2011   Subcutaneous emphysema 05/05/2011   Cough 04/03/2011   Neuropathy 04/03/2011   Osteoarthritis 05/15/2010   STIFFNESS OF JOINT NEC ANKLE AND FOOT 05/15/2010   GANGLION CYST, WRIST, RIGHT 05/15/2010   VERTIGO, BENIGN PAROXYSMAL POSITION 01/31/2007    Allergies:  Allergies  Allergen Reactions   Keflex  [Cephalexin ]     Dizziness   Lyrica [Pregabalin] Other (See Comments)    High Fever    Oxytetracycline Other (See Comments)    High Fever caused by terramycin    Memantine  Rash   Medications:  Current Outpatient Medications:    nirmatrelvir/ritonavir (PAXLOVID) 20 x 150 MG & 10 x 100MG  TABS, Take 3 tablets by mouth 2 (two) times daily for 5 days. (Take nirmatrelvir 150 mg two tablets twice daily for 5 days and ritonavir 100 mg one tablet twice daily for 5 days) Patient GFR is > 60 per BMP on 01/25/24, Disp: 30 tablet, Rfl: 0   acetaminophen  (TYLENOL ) 325 MG tablet, Take 650 mg by mouth every 6 (six) hours as needed for moderate pain (pain score 4-6) or mild pain (pain score 1-3)., Disp: , Rfl:    Apoaequorin (PREVAGEN) 10 MG CAPS, Take 10 mg by mouth daily., Disp: , Rfl:    Ascorbic Acid  (VITAMIN C ) 1000 MG tablet, Take 1,000 mg by mouth daily., Disp: , Rfl:    gabapentin  (NEURONTIN ) 300 MG capsule, TAKE 1 CAPSULE BY MOUTH EVERYDAY AT BEDTIME (Patient taking differently: Take 300 mg by mouth as needed (Pain).), Disp: 90 capsule, Rfl: 0   Multiple Vitamin (MULTIVITAMIN) tablet, Take 1 tablet by mouth daily after lunch., Disp: , Rfl:    senna (SENOKOT) 8.6 MG TABS tablet, Take 1 tablet (8.6 mg total) by mouth daily. (Patient taking differently: Take 1 tablet by mouth daily as needed for mild constipation.), Disp: 120 tablet, Rfl: 0   simvastatin  (ZOCOR ) 10 MG tablet, TAKE 1 TABLET BY MOUTH EVERY DAY, Disp: 90 tablet, Rfl: 1  Observations/Objective: Patient is well-developed, well-nourished in no acute distress.  Resting comfortably  at home.  Head is  normocephalic, atraumatic.  No labored breathing. Speech is clear and coherent with logical content.  Patient is alert and oriented at baseline.   Assessment and Plan: 1. COVID-19 (Primary) - nirmatrelvir/ritonavir (PAXLOVID) 20 x 150 MG & 10 x 100MG  TABS; Take 3 tablets by mouth 2 (two) times daily for 5 days. (Take nirmatrelvir 150 mg two tablets twice daily for 5 days and ritonavir 100 mg one tablet twice daily for 5 days) Patient GFR is > 60 per BMP on 01/25/24  Dispense: 30 tablet; Refill: 0  Patient with multiple risk factors for complicated course of illness. Discussed risks/benefits of antiviral medications including most common potential ADRs. Patient voiced understanding and would like to proceed with antiviral medication. They are candidate for Paxlovid with recent GFR on 01/25/2024 > 60. She is to hold Simvastatin  (last dose this AM) while on Paxlovid and for 5 more days after completion. Rx sent to pharmacy. Supportive measures, OTC medications and vitamin regimen reviewed. Quarantine reviewed in detail. Strict ER precautions discussed with patient.    Follow Up Instructions: I discussed  the assessment and treatment plan with the patient. The patient was provided an opportunity to ask questions and all were answered. The patient agreed with the plan and demonstrated an understanding of the instructions.  A copy of instructions were sent to the patient via MyChart unless otherwise noted below.    The patient was advised to call back or seek an in-person evaluation if the symptoms worsen or if the condition fails to improve as anticipated.    Susan Velma Lunger, PA-C

## 2024-01-31 NOTE — Telephone Encounter (Signed)
 FYI Only or Action Required?: Action required by provider: update on patient condition and requesting COVID Rx.  Patient was last seen in primary care on 11/09/2023 by Panosh, [name], MD.  Called Nurse Triage reporting Covid Positive.  Symptoms began several days ago.  Interventions attempted: Rest, hydration, or home remedies.  Symptoms are: unchanged.  Triage Disposition: Call PCP Within 24 Hours  Patient/caregiver understands and will follow disposition?: Yes         Copied from CRM #[id number]. Topic: Clinical - Medication Question >> Jan 31, 2024  4:11 PM [place] J wrote: Reason for CRM: Patient tested positive for COVID and would like to know if there is a medication Dr. Charlett could send her.  [id number] - [place], [place] - [address] AT [facility] [address] [place] [address] Phone: [phone] Fax: [phone] Hours: Open 24 hours Reason for Disposition  [1] HIGH RISK patient (e.g., weak immune system, 65 years and older, obesity with BMI 30 or higher, pregnant, chronic lung disease) AND [2] COVID symptoms (e.g., cough, fever)  (Exceptions: Already seen by PCP and no new or worsening symptoms.)    Pt lives at nursing home and was COVD + today. Pt experiencing mild sx but would like [facility].  [facility] will forward encounter for Dr Charlett 's office to review and advise. Patient verbalized understanding and is expecting call back from office to confirm Rx was sent.  Answer Assessment - Initial Assessment Questions 1. SYMPTOMS: What is your main symptom or concern? (e.g., cough, fever, shortness of breath, muscle aches)     I havent been feeling well. Some SOB, muscle aches 2. ONSET: When did the symptoms start?      A few days ago 3. COUGH: Do you have a cough? If Yes, ask: How bad is the cough?       occasional 4. FEVER: Do you have a fever? If Yes, ask: What is your temperature,  how was it measured, and when did it start?     denies 5. BREATHING DIFFICULTY: Are you having any difficulty breathing? (e.g., normal; shortness of breath, wheezing, unable to speak)      Yes, only occasionally [facility] does not appreciate audible SOB/wheezing during call. Pt is speaking in full sentences.  6. BETTER-SAME-WORSE: Are you getting better, staying the same or getting worse compared to yesterday?  If getting worse, ask, In what way?     *No Answer* 7. OTHER SYMPTOMS: Do you have any other symptoms?  (e.g., chills, fatigue, headache, loss of smell or taste, muscle pain, sore throat)     Muscle ache. 8. COVID-19 DIAGNOSIS: How do you know that you have COVID? (e.g., positive lab test or self-test, diagnosed by doctor or NP/PA, symptoms after exposure).     + at Nursing Home 9. COVID-19 EXPOSURE: Was there any known exposure to COVID before the symptoms began?      unknown 10. COVID-19 VACCINE: Have you had the COVID-19 vaccine? If Yes, ask: When did you last get it?       *No Answer* 11. HIGH RISK DISEASE: Do you have any chronic medical problems? (e.g., asthma, heart or lung disease, weak immune system, obesity, etc.)       Age. 12. PREGNANCY: Is there any chance you are pregnant? When was your last menstrual period?       N/a 13. O2 SATURATION MONITOR:  Do you use an oxygen  saturation monitor (pulse oximeter) at home? If Yes, ask What is your reading (oxygen level) today? What is your usual oxygen saturation reading? (e.g., 95%)       N/a  Protocols used: COVID-19 - Diagnosed or Suspected-A-AH

## 2024-01-31 NOTE — Telephone Encounter (Signed)
 Patient called back in to say she received a message to schedule an e visit or something like that, I do not know how to do that. This Clinical research associate scheduled virtual urgent care visit for covid 19, antiviral request. Patient states she is unsure how to join a video visit, she does not have a computer. Confirmed patient has cell phone. Provided patient with MyChart help line phone number for assistance accessing Mychart video visit. Patient will call back pcp office if further assistance is needed. Patient aware message from earlier triage was sent to pcp. No further questions at this time.

## 2024-01-31 NOTE — Patient Instructions (Signed)
 Susan Ford, thank you for joining [name], PA-C for today's virtual visit.  While this provider is not your primary care provider (PCP), if your PCP is located in our provider database this encounter information will be shared with them immediately following your visit.   A [place] MyChart account gives you access to today's visit and all your visits, tests, and labs performed at [facility]  click here if you don't have a [place] MyChart account or go to [email or web address]  Consent: (Patient) Susan Ford provided verbal consent for this virtual visit at the beginning of the encounter.  Current Medications:  Current Outpatient Medications:    nirmatrelvir/ritonavir (PAXLOVID) 20 x 150 MG & 10 x 100MG  TABS, Take 3 tablets by mouth 2 (two) times daily for 5 days. (Take nirmatrelvir 150 mg two tablets twice daily for 5 days and ritonavir 100 mg one tablet twice daily for 5 days) Patient GFR is > 60 per BMP on 01/25/24, Disp: 30 tablet, Rfl: 0   acetaminophen  (TYLENOL ) 325 MG tablet, Take 650 mg by mouth every 6 (six) hours as needed for moderate pain (pain score 4-6) or mild pain (pain score 1-3)., Disp: , Rfl:    Apoaequorin (PREVAGEN) 10 MG CAPS, Take 10 mg by mouth daily., Disp: , Rfl:    Ascorbic Acid  (VITAMIN C ) 1000 MG tablet, Take 1,000 mg by mouth daily., Disp: , Rfl:    gabapentin  (NEURONTIN ) 300 MG capsule, TAKE 1 CAPSULE BY MOUTH EVERYDAY AT BEDTIME (Patient taking differently: Take 300 mg by mouth as needed (Pain).), Disp: 90 capsule, Rfl: 0   Multiple Vitamin (MULTIVITAMIN) tablet, Take 1 tablet by mouth daily after lunch., Disp: , Rfl:    senna (SENOKOT) 8.6 MG TABS tablet, Take 1 tablet (8.6 mg total) by mouth daily. (Patient taking differently: Take 1 tablet by mouth daily as needed for mild constipation.), Disp: 120 tablet, Rfl: 0   simvastatin  (ZOCOR ) 10 MG tablet, TAKE 1 TABLET BY MOUTH EVERY DAY, Disp: 90 tablet, Rfl: 1    Medications ordered in this encounter:  Meds ordered this encounter  Medications   nirmatrelvir/ritonavir (PAXLOVID) 20 x 150 MG & 10 x 100MG  TABS    Sig: Take 3 tablets by mouth 2 (two) times daily for 5 days. (Take nirmatrelvir 150 mg two tablets twice daily for 5 days and ritonavir 100 mg one tablet twice daily for 5 days) Patient GFR is > 60 per BMP on 01/25/24    Dispense:  30 tablet    Refill:  0    Supervising Provider:   BLAISE ALEENE KIDD [phone]     *If you need refills on other medications prior to your next appointment, please contact your pharmacy*  Follow-Up: Call back or seek an in-person evaluation if the symptoms worsen or if the condition fails to improve as anticipated.  [facility] [phone]  Care Instructions: Please keep well-hydrated and get plenty of rest. Start a saline nasal rinse to flush out your nasal passages. You can use plain Mucinex to help thin congestion. Take the Paxlovid as directed, remembering to not take your Simvastatin  for the next 10 days If you have a humidifier, running in the bedroom at night. If you note any non-resolving, new, or worsening symptoms despite treatment, please seek an in-person evaluation ASAP.       Isolation Instructions: You are to isolate at home until you have been fever free for at least 24 hours without a fever-reducing  medication, and symptoms have been steadily improving for 24 hours. At that time,  you can end isolation but need to mask for an additional 5 days.   If you must be around other household members who do not have symptoms, you need to make sure that both you and the family members are masking consistently with a high-quality mask.  If you note any worsening of symptoms despite treatment, please seek an in-person evaluation ASAP. If you note any significant shortness of breath or any chest pain, please seek ER evaluation. Please do not delay care!   COVID-19: What to Do if You  Are Sick If you test positive and are an older adult or someone who is at high risk of getting very sick from COVID-19, treatment may be available. Contact a healthcare provider right away after a positive test to determine if you are eligible, even if your symptoms are mild right now. You can also visit a Test to Treat location and, if eligible, receive a prescription from a provider. Don't delay: Treatment must be started within the first few days to be effective. If you have a fever, cough, or other symptoms, you might have COVID-19. Most people have mild illness and are able to recover at home. If you are sick: Keep track of your symptoms. If you have an emergency warning sign (including trouble breathing), call 911. Steps to help prevent the spread of COVID-19 if you are sick If you are sick with COVID-19 or think you might have COVID-19, follow the steps below to care for yourself and to help protect other people in your home and community. Stay home except to get medical care Stay home. Most people with COVID-19 have mild illness and can recover at home without medical care. Do not leave your home, except to get medical care. Do not visit public areas and do not go to places where you are unable to wear a mask. Take care of yourself. Get rest and stay hydrated. Take over-the-counter medicines, such as acetaminophen , to help you feel better. Stay in touch with your doctor. Call before you get medical care. Be sure to get care if you have trouble breathing, or have any other emergency warning signs, or if you think it is an emergency. Avoid public transportation, ride-sharing, or taxis if possible. Get tested If you have symptoms of COVID-19, get tested. While waiting for test results, stay away from others, including staying apart from those living in your household. Get tested as soon as possible after your symptoms start. Treatments may be available for people with COVID-19 who are at risk for  becoming very sick. Don't delay: Treatment must be started early to be effective--some treatments must begin within 5 days of your first symptoms. Contact your healthcare provider right away if your test result is positive to determine if you are eligible. Self-tests are one of several options for testing for the virus that causes COVID-19 and may be more convenient than laboratory-based tests and point-of-care tests. Ask your healthcare provider or your local health department if you need help interpreting your test results. You can visit your state, tribal, local, and territorial health department's website to look for the latest local information on testing sites. Separate yourself from other people As much as possible, stay in a specific room and away from other people and pets in your home. If possible, you should use a separate bathroom. If you need to be around other people or animals in or outside  of the home, wear a well-fitting mask. Tell your close contacts that they may have been exposed to COVID-19. An infected person can spread COVID-19 starting 48 hours (or 2 days) before the person has any symptoms or tests positive. By letting your close contacts know they may have been exposed to COVID-19, you are helping to protect everyone. See COVID-19 and Animals if you have questions about pets. If you are diagnosed with COVID-19, someone from the health department may call you. Answer the call to slow the spread. Monitor your symptoms Symptoms of COVID-19 include fever, cough, or other symptoms. Follow care instructions from your healthcare provider and local health department. Your local health authorities may give instructions on checking your symptoms and reporting information. When to seek emergency medical attention Look for emergency warning signs* for COVID-19. If someone is showing any of these signs, seek emergency medical care immediately: Trouble breathing Persistent pain or pressure  in the chest New confusion Inability to wake or stay awake Pale, gray, or blue-colored skin, lips, or nail beds, depending on skin tone *This list is not all possible symptoms. Please call your medical provider for any other symptoms that are severe or concerning to you. Call 911 or call ahead to your local emergency facility: Notify the operator that you are seeking care for someone who has or may have COVID-19. Call ahead before visiting your doctor Call ahead. Many medical visits for routine care are being postponed or done by phone or telemedicine. If you have a medical appointment that cannot be postponed, call your doctor's office, and tell them you have or may have COVID-19. This will help the office protect themselves and other patients. If you are sick, wear a well-fitting mask You should wear a mask if you must be around other people or animals, including pets (even at home). Wear a mask with the best fit, protection, and comfort for you. You don't need to wear the mask if you are alone. If you can't put on a mask (because of trouble breathing, for example), cover your coughs and sneezes in some other way. Try to stay at least 6 feet away from other people. This will help protect the people around you. Masks should not be placed on young children under age 39 years, anyone who has trouble breathing, or anyone who is not able to remove the mask without help. Cover your coughs and sneezes Cover your mouth and nose with a tissue when you cough or sneeze. Throw away used tissues in a lined trash can. Immediately wash your hands with soap and water for at least 20 seconds. If soap and water are not available, clean your hands with an alcohol -based hand sanitizer that contains at least 60% alcohol . Clean your hands often Wash your hands often with soap and water for at least 20 seconds. This is especially important after blowing your nose, coughing, or sneezing; going to the bathroom; and  before eating or preparing food. Use hand sanitizer if soap and water are not available. Use an alcohol -based hand sanitizer with at least 60% alcohol , covering all surfaces of your hands and rubbing them together until they feel dry. Soap and water are the best option, especially if hands are visibly dirty. Avoid touching your eyes, nose, and mouth with unwashed hands. Handwashing Tips Avoid sharing personal household items Do not share dishes, drinking glasses, cups, eating utensils, towels, or bedding with other people in your home. Wash these items thoroughly after using them with soap  and water or put in the dishwasher. Clean surfaces in your home regularly Clean and disinfect high-touch surfaces (for example, doorknobs, tables, handles, light switches, and countertops) in your sick room and bathroom. In shared spaces, you should clean and disinfect surfaces and items after each use by the person who is ill. If you are sick and cannot clean, a caregiver or other person should only clean and disinfect the area around you (such as your bedroom and bathroom) on an as needed basis. Your caregiver/other person should wait as long as possible (at least several hours) and wear a mask before entering, cleaning, and disinfecting shared spaces that you use. Clean and disinfect areas that may have blood, stool, or body fluids on them. Use household cleaners and disinfectants. Clean visible dirty surfaces with household cleaners containing soap or detergent. Then, use a household disinfectant. Use a product from [employer] List N: Disinfectants for Coronavirus (COVID-19). Be sure to follow the instructions on the label to ensure safe and effective use of the product. Many products recommend keeping the surface wet with a disinfectant for a certain period of time (look at contact time on the product label). You may also need to wear personal protective equipment, such as gloves, depending on the directions on  the product label. Immediately after disinfecting, wash your hands with soap and water for 20 seconds. For completed guidance on cleaning and disinfecting your home, visit Complete Disinfection Guidance. Take steps to improve ventilation at home Improve ventilation (air flow) at home to help prevent from spreading COVID-19 to other people in your household. Clear out COVID-19 virus particles in the air by opening windows, using air filters, and turning on fans in your home. Use this interactive tool to learn how to improve air flow in your home. When you can be around others after being sick with COVID-19 Deciding when you can be around others is different for different situations. Find out when you can safely end home isolation. For any additional questions about your care, contact your healthcare provider or state or local health department. 07/15/2020 Content source: [facility] ([facility]), Division of Viral Diseases This information is not intended to replace advice given to you by your health care provider. Make sure you discuss any questions you have with your health care provider. Document Revised: 08/28/2020 Document Reviewed: 08/28/2020 Elsevier Patient Education  2022 [employer].     If you have been instructed to have an in-person evaluation today at a local Urgent Care facility, please use the link below. It will take you to a list of all of our available [facility], including address, phone number and hours of operation. Please do not delay care.  [facility]  If you or a family member do not have a primary care provider, use the link below to schedule a visit and establish care. When you choose a [facility] primary care physician or advanced practice provider, you gain a long-term partner in health. Find a Primary Care Provider  Learn more about [facility] in-office and virtual care options: [facility] - Get Care Now

## 2024-02-01 ENCOUNTER — Emergency Department (HOSPITAL_COMMUNITY)

## 2024-02-01 ENCOUNTER — Emergency Department (HOSPITAL_COMMUNITY)
Admission: EM | Admit: 2024-02-01 | Discharge: 2024-02-01 | Attending: Emergency Medicine | Admitting: Emergency Medicine

## 2024-02-01 ENCOUNTER — Other Ambulatory Visit: Payer: Self-pay

## 2024-02-01 ENCOUNTER — Encounter (HOSPITAL_COMMUNITY): Payer: Self-pay

## 2024-02-01 DIAGNOSIS — R0602 Shortness of breath: Secondary | ICD-10-CM | POA: Insufficient documentation

## 2024-02-01 DIAGNOSIS — I700 Atherosclerosis of aorta: Secondary | ICD-10-CM | POA: Diagnosis not present

## 2024-02-01 DIAGNOSIS — R531 Weakness: Secondary | ICD-10-CM | POA: Diagnosis not present

## 2024-02-01 DIAGNOSIS — Z8616 Personal history of COVID-19: Secondary | ICD-10-CM | POA: Diagnosis not present

## 2024-02-01 DIAGNOSIS — M25562 Pain in left knee: Secondary | ICD-10-CM | POA: Diagnosis not present

## 2024-02-01 DIAGNOSIS — Z7401 Bed confinement status: Secondary | ICD-10-CM | POA: Diagnosis not present

## 2024-02-01 DIAGNOSIS — R404 Transient alteration of awareness: Secondary | ICD-10-CM | POA: Diagnosis not present

## 2024-02-01 DIAGNOSIS — U071 COVID-19: Secondary | ICD-10-CM

## 2024-02-01 DIAGNOSIS — R918 Other nonspecific abnormal finding of lung field: Secondary | ICD-10-CM | POA: Diagnosis not present

## 2024-02-01 LAB — BASIC METABOLIC PANEL WITH GFR
Anion gap: 12 (ref 5–15)
BUN: 10 mg/dL (ref 8–23)
CO2: 22 mmol/L (ref 22–32)
Calcium: 9.4 mg/dL (ref 8.9–10.3)
Chloride: 100 mmol/L (ref 98–111)
Creatinine, Ser: 0.69 mg/dL (ref 0.44–1.00)
GFR, Estimated: >60 mL/min (ref >60)
Glucose, Bld: 101 mg/dL — ABNORMAL HIGH (ref 70–99)
Potassium: 4.3 mmol/L (ref 3.5–5.1)
Sodium: 133 mmol/L — ABNORMAL LOW (ref 135–145)

## 2024-02-01 LAB — CBC WITH DIFFERENTIAL/PLATELET
Abs Immature Granulocytes: 0.02 K/uL (ref 0.00–0.07)
Basophils Absolute: 0.1 K/uL (ref 0.0–0.1)
Basophils Relative: 1 %
Eosinophils Absolute: 0.4 K/uL (ref 0.0–0.5)
Eosinophils Relative: 5 %
HCT: 37.1 % (ref 36.0–46.0)
Hemoglobin: 12.1 g/dL (ref 12.0–15.0)
Immature Granulocytes: 0 %
Lymphocytes Relative: 20 %
Lymphs Abs: 1.4 K/uL (ref 0.7–4.0)
MCH: 30.2 pg (ref 26.0–34.0)
MCHC: 32.6 g/dL (ref 30.0–36.0)
MCV: 92.5 fL (ref 80.0–100.0)
Monocytes Absolute: 0.5 K/uL (ref 0.1–1.0)
Monocytes Relative: 8 %
Neutro Abs: 4.4 K/uL (ref 1.7–7.7)
Neutrophils Relative %: 66 %
Platelets: 291 K/uL (ref 150–400)
RBC: 4.01 MIL/uL (ref 3.87–5.11)
RDW: 12.1 % (ref 11.5–15.5)
WBC: 6.8 K/uL (ref 4.0–10.5)
nRBC: 0 % (ref 0.0–0.2)

## 2024-02-01 MED ORDER — ACETAMINOPHEN 500 MG PO TABS
1000.0000 mg | ORAL_TABLET | Freq: Once | ORAL | Status: AC
  Administered 2024-02-01: 1000 mg via ORAL
  Filled 2024-02-01: qty 2

## 2024-02-01 MED ORDER — DOXYCYCLINE HYCLATE 100 MG PO CAPS: 100.0000 mg | ORAL_CAPSULE | Freq: Two times a day (BID) | ORAL | 0 refills | Status: DC

## 2024-02-01 MED ORDER — OXYCODONE HCL 5 MG PO TABS
5.0000 mg | ORAL_TABLET | Freq: Once | ORAL | Status: AC
  Administered 2024-02-01: 5 mg via ORAL
  Filled 2024-02-01: qty 1

## 2024-02-01 NOTE — Discharge Instructions (Signed)
 You have COVID.  That sometimes makes people have trouble breathing.  Your doctor had started you on a medication to try and help you with this.  Your x-ray shows that you might have pneumonia.  This is probably from COVID but I did start you on antibiotics just in case.  Please return for worsening difficulty breathing.

## 2024-02-01 NOTE — ED Triage Notes (Signed)
 02/01/2024 10:15 AM  Signed Patient brought in by [facility] from [facility]. When asked patient states that she does not know why EMS was called, but endorses that she was diagnosed with COVID 1 week ago. She denies SOB with SpO2 98% on room air.   [facility] states that they were called for chest pain, which patient denied on EMS arrival. Patient continues to deny pain at this time.

## 2024-02-01 NOTE — Telephone Encounter (Signed)
 Spoke with pt yesterday(01/31/2024) at around 5:00-5:10pm, after follow up with Dr. Charlett. Inform pt that provider doesn't prescribe medication without a visit. Offer pt an appt for a.m today 02/01/2024 for virtual or in person. Pt states she cannot come in and doesn't have smart phone or laptop for virtual. Inform pt other option is pt can do after hour, e-visit a telephone call. Direct pt to call office number after-hour for e-visit. She will be connected to a triage nurse and advise pt to inform a nurse that she needs to be connect with e-visit for positive covid. Pt verbalized understanding.

## 2024-02-01 NOTE — ED Provider Notes (Signed)
 [place] EMERGENCY DEPARTMENT AT [facility] Provider Note   CSN: [id number] Arrival date & time: 02/01/24  9043     Patient presents with: Shortness of Breath   Susan Ford is a 89 y.o. female.   89 yo F with a chief complaints of difficulty breathing.  This was reported by EMS.  They state that they are initially called out for chest pain.  Patient has been diagnosed with COVID reportedly has had symptoms for about a week.    The patient provides a completely different history.  She tells me that he has been sick for about 48 hours.  Has been coughing a bit.  She denies any difficulty breathing denies chest pain or pressure.  Feels like she is been able to eat and drink normally.  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  She denies any obvious sick contacts.   Shortness of Breath      Prior to Admission medications   Medication Sig Start Date End Date Taking? Authorizing Provider  acetaminophen  (TYLENOL ) 325 MG tablet Take 650 mg by mouth every 6 (six) hours as needed for moderate pain (pain score 4-6) or mild pain (pain score 1-3).    [provider]  Apoaequorin (PREVAGEN) 10 MG CAPS Take 10 mg by mouth daily.    [provider]  Ascorbic Acid  (VITAMIN C ) 1000 MG tablet Take 1,000 mg by mouth daily.    [provider]  gabapentin  (NEURONTIN ) 300 MG capsule TAKE 1 CAPSULE BY MOUTH EVERYDAY AT BEDTIME Patient taking differently: Take 300 mg by mouth as needed (Pain). 11/14/23   Panosh, Wanda K, MD  Multiple Vitamin (MULTIVITAMIN) tablet Take 1 tablet by mouth daily after lunch.    [provider]  nirmatrelvir/ritonavir (PAXLOVID) 20 x 150 MG & 10 x 100MG  TABS Take 3 tablets by mouth 2 (two) times daily for 5 days. (Take nirmatrelvir 150 mg two tablets twice daily for 5 days and ritonavir 100 mg one tablet twice daily for 5 days) Patient GFR is > 60 per BMP on 01/25/24 01/31/24  02/05/24  Gladis Elsie BROCKS, PA-C  senna (SENOKOT) 8.6 MG TABS tablet Take 1 tablet (8.6 mg total) by mouth daily. Patient taking differently: Take 1 tablet by mouth daily as needed for mild constipation. 01/02/23   Christobal Guadalajara, MD  simvastatin  (ZOCOR ) 10 MG tablet TAKE 1 TABLET BY MOUTH EVERY DAY 06/27/23   Panosh, Wanda K, MD    Allergies: Keflex  [cephalexin ], Lyrica [pregabalin], Oxytetracycline, and Memantine     Review of Systems  Respiratory:  Positive for shortness of breath.     Updated Vital Signs BP (!) 160/84 (BP Location: Right Arm)   Pulse 74   Temp 98.8 F (37.1 C) (Oral)   Resp 13   Ht 5' 4 (1.626 m)   SpO2 97%   BMI 19.64 kg/m   Physical Exam Vitals and nursing note reviewed.  Constitutional:      General: She is not in acute distress.    Appearance: She is well-developed. She is not diaphoretic.  HENT:     Head: Normocephalic and atraumatic.  Eyes:     Pupils: Pupils are equal, round, and reactive to light.  Cardiovascular:     Rate and Rhythm: Normal rate and regular rhythm.     Heart sounds: No murmur heard.    No friction rub. No gallop.  Pulmonary:     Effort: Pulmonary effort is  normal.     Breath sounds: No wheezing or rales.  Abdominal:     General: There is no distension.     Palpations: Abdomen is soft.     Tenderness: There is no abdominal tenderness.  Musculoskeletal:        General: No tenderness.     Cervical back: Normal range of motion and neck supple.  Skin:    General: Skin is warm and dry.  Neurological:     Mental Status: She is alert and oriented to person, place, and time.  Psychiatric:        Behavior: Behavior normal.     (all labs ordered are listed, but only abnormal results are displayed) Labs Reviewed  CBC WITH DIFFERENTIAL/PLATELET  BASIC METABOLIC PANEL WITH GFR    EKG: None  Radiology: No results found.   Procedures   Medications Ordered in the ED - No data to display                                   Medical Decision Making Amount and/or Complexity of Data Reviewed Labs: ordered. Radiology: ordered.  Risk OTC drugs. Prescription drug management.   89 yo F with a chief complaints of difficulty breathing.  This was reported by the nursing facility to EMS however the patient denies any such symptoms.  Patient is quite sharp and so not sure where this complaint came from.  She is not hypoxic not tachypneic no obvious adventitious lung sounds.  Will obtain a portable chest x-ray blood work.  Chest x-ray independently interpreted by me with likely viral-like syndrome, no obvious focal infiltrates.  No acute anemia no significant electrolyte abnormalities.  Radiology read of x-ray with possible pneumonia.  Will start on doxycycline.  PCP follow-up.  As I was giving the patient her results she mention to me that it is very uncomfortable in her structures and that she is having pain in her left leg.  Tells me that she has had pain in that left leg for some time now but it is a bit worse lying in his bed.  She would like a dose of medicine before she leaves.  1:23 PM:  I have discussed the diagnosis/risks/treatment options with the patient.  Evaluation and diagnostic testing in the emergency department does not suggest an emergent condition requiring admission or immediate intervention beyond what has been performed at this time.  They will follow up with PCP. We also discussed returning to the ED immediately if new or worsening sx occur. We discussed the sx which are most concerning (e.g., sudden worsening pain, fever, inability to tolerate by mouth) that necessitate immediate return. Medications administered to the patient during their visit and any new prescriptions provided to the patient are listed below.  Medications given during this visit Medications  acetaminophen  (TYLENOL ) tablet 1,000 mg (has no administration in time range)  oxyCODONE  (Oxy IR/ROXICODONE ) immediate release tablet 5 mg  (has no administration in time range)     The patient appears reasonably screen and/or stabilized for discharge and I doubt any other medical condition or other [facility] requiring further screening, evaluation, or treatment in the ED at this time prior to discharge.       Final diagnoses:  None    ED Discharge Orders     None          Emil Share, DO 02/01/24 1323

## 2024-02-02 ENCOUNTER — Other Ambulatory Visit: Payer: Self-pay

## 2024-02-02 ENCOUNTER — Ambulatory Visit: Payer: Self-pay

## 2024-02-02 ENCOUNTER — Emergency Department (HOSPITAL_COMMUNITY)

## 2024-02-02 ENCOUNTER — Encounter (HOSPITAL_COMMUNITY): Payer: Self-pay | Admitting: Emergency Medicine

## 2024-02-02 ENCOUNTER — Telehealth: Payer: Self-pay | Admitting: *Deleted

## 2024-02-02 ENCOUNTER — Emergency Department (HOSPITAL_COMMUNITY)
Admission: EM | Admit: 2024-02-02 | Discharge: 2024-02-03 | Disposition: A | Discharge status: Skilled Nursing Facility | Attending: Emergency Medicine | Admitting: Emergency Medicine

## 2024-02-02 DIAGNOSIS — W19XXXA Unspecified fall, initial encounter: Secondary | ICD-10-CM | POA: Diagnosis not present

## 2024-02-02 DIAGNOSIS — W010XXA Fall on same level from slipping, tripping and stumbling without subsequent striking against object, initial encounter: Secondary | ICD-10-CM | POA: Insufficient documentation

## 2024-02-02 DIAGNOSIS — S8392XA Sprain of unspecified site of left knee, initial encounter: Secondary | ICD-10-CM | POA: Diagnosis not present

## 2024-02-02 DIAGNOSIS — M25562 Pain in left knee: Secondary | ICD-10-CM | POA: Diagnosis not present

## 2024-02-02 DIAGNOSIS — M25569 Pain in unspecified knee: Secondary | ICD-10-CM | POA: Diagnosis not present

## 2024-02-02 DIAGNOSIS — S8992XA Unspecified injury of left lower leg, initial encounter: Secondary | ICD-10-CM | POA: Diagnosis not present

## 2024-02-02 MED ORDER — ACETAMINOPHEN 500 MG PO TABS: 1000.0000 mg | ORAL_TABLET | Freq: Once | ORAL | Status: DC

## 2024-02-02 NOTE — ED Notes (Signed)
 [facility] called for transport back to [facility]

## 2024-02-02 NOTE — Telephone Encounter (Signed)
 FYI Only or Action Required?: Action required by provider: request for appointment and clinical question for provider.  Patient was last seen in primary care on 11/09/2023 by Panosh, [name], MD.  Called Nurse Triage reporting Nausea.  Symptoms began today.  Interventions attempted: Prescription medications: doxycline.  Symptoms are: unchanged.  Triage Disposition: Call PCP Within 24 Hours  Patient/caregiver understands and will follow disposition?: Yes  Copied from CRM #[id number]. Topic: Clinical - Red Word Triage >> Feb 02, 2024  1:41 PM Robinson H wrote: [facility] that prompted transfer to Nurse Triage: Patient states she started taking the doxycycline (VIBRAMYCIN) 100 MG capsule last night and this morning and now she's nauseated. Reason for Disposition  Taking prescription medication that could cause nausea (e.g., narcotics/opiates, antibiotics, OCPs, many others)  Answer Assessment - Initial Assessment Questions No available appts today. Offered to schedule virtual visit, patient declines and reports no computer/smart phone/not comfortable.  Advised UC today and ED if symptoms worsen.  Patient requesting to know if she should stop antibiotic, Doxycyline. Nurse advised to continue to take medication  as prescribed, until receives further recommendation for MD and or Urgent Care. Call Back  Nurse provided patient [facility]; wear mask and hand hygiene.  Pt unsure if having fever, reports chills,  Currenty have COVID and was prescribed doxycline. Able to eat and drink, BM and urinate without difficulties Last BM today, normal.  Denies dizziness, weakness, difficulty breathing; just don't feel good. 1. NAUSEA SEVERITY: How bad is the nausea? (e.g., mild, moderate, severe; dehydration, weight loss)     moderate 2. ONSET: When did the nausea begin?     today 3. VOMITING: Any vomiting? If Yes, ask: How many times today?     no 4. RECURRENT SYMPTOM: Have you  had nausea before? If Yes, ask: When was the last time? What happened that time?     Today; at 11AM 5. CAUSE: What do you think is causing the nausea?  Doxycyline started last night, today after taking dose, made nauseated. Reports took Boost supplement drink.  Protocols used: Nausea-A-AH

## 2024-02-02 NOTE — ED Triage Notes (Signed)
 Patient BIB EMS from [facility] c/o left knee pain after a fall yesterday.. Patient report she slipped and twisted her left knee. Patient report worsening left knee pain tonight. Patient denies hitting her head. Patient denies LOC. Patient denies taking blood thinners.  BP 126/80 HR 94 RR 20 O2sat 98 % on RA

## 2024-02-02 NOTE — Telephone Encounter (Signed)
 Follow up with pt and inform her of provider's advise. Pt responded  to take it even when feel nauseated?  SABRA Inform pt yes to continue taking it. Pt states she will take it tonight and what will see what happened tomorrow. Advise to give us  call if have questions. Pt verbalized understanding.

## 2024-02-02 NOTE — Telephone Encounter (Signed)
 Please see other phone encounter

## 2024-02-02 NOTE — Telephone Encounter (Signed)
 Copied from CRM [id number]. Topic: Clinical - Medication Question >> Feb 02, 2024  2:39 PM Carlyon D wrote: Reason for CRM: Pt is calling has medication   doxycycline (VIBRAMYCIN) 100 MG capsule  Pt states she is feeling better is asking if she can stop taking the medication or should she continue it for the 7 days advised pt to follow instructions until some one reached out in regards to her medication question  Please reach out to pt.

## 2024-02-02 NOTE — Telephone Encounter (Signed)
 Reason for Disposition . [1] SEVERE pain (e.g., excruciating, unable to do any normal activities) AND [2] not improved after 2 hours of pain medicine  Answer Assessment - Initial Assessment Questions Called 911. Nurse asked patient if okay to call 911, patient states okay to call. EMS in route.  Patient reports difficulty walking and standing on leg. Patient reports unable to go to UC/ED, no transportation, lives alone.  Nurse triaged patient previously, patient never mentioned knee/leg injury. Patient called back with severe leg pain.  1. ONSET: When did the pain start?      yesterday 2. LOCATION: Where is the pain located?      Left leg 3. PAIN: How bad is the pain?    (Scale 1-10; or mild, moderate, severe)     10/10; knee pain and pain goes down; swollen foot,  denies discolored, cool to touch,  5. CAUSE: What do you think is causing the leg pain?     Think I twisted it yesterday when almost falling; twisted back and hit leg; tylenol  6. OTHER SYMPTOMS: Do you have any other symptoms? (e.g., chest pain, back pain, breathing difficulty, swelling, rash, fever, numbness, weakness) Reports weakness, denies difficulty breathing, chest pain  Protocols used: Leg Pain-A-AH

## 2024-02-02 NOTE — ED Provider Notes (Signed)
 [place] EMERGENCY DEPARTMENT AT [facility] Provider Note   CSN: [id number] Arrival date & time: 02/02/24  1815     Patient presents with: Susan Ford   Susan Ford is a 89 y.o. female.   This is a pleasant 89 year old female who is here today for left knee pain.  She had a fall yesterday and twisted her knee when she fell.  Patient is imaging on other areas were negative, but today she started have some pain and swelling in her knee.  Ambulates with a walker.  Lives at [facility]   Fall       Prior to Admission medications   Medication Sig Start Date End Date Taking? Authorizing Provider  acetaminophen  (TYLENOL ) 325 MG tablet Take 650 mg by mouth every 6 (six) hours as needed for moderate pain (pain score 4-6) or mild pain (pain score 1-3).    [provider]  Apoaequorin (PREVAGEN) 10 MG CAPS Take 10 mg by mouth daily.    [provider]  Ascorbic Acid  (VITAMIN C ) 1000 MG tablet Take 1,000 mg by mouth daily.    [provider]  doxycycline (VIBRAMYCIN) 100 MG capsule Take 1 capsule (100 mg total) by mouth 2 (two) times daily. One po bid x 7 days 02/01/24   Emil Share, DO  gabapentin  (NEURONTIN ) 300 MG capsule TAKE 1 CAPSULE BY MOUTH EVERYDAY AT BEDTIME Patient taking differently: Take 300 mg by mouth as needed (Pain). 11/14/23   Panosh, Wanda K, MD  Multiple Vitamin (MULTIVITAMIN) tablet Take 1 tablet by mouth daily after lunch.    [provider]  nirmatrelvir/ritonavir (PAXLOVID) 20 x 150 MG & 10 x 100MG  TABS Take 3 tablets by mouth 2 (two) times daily for 5 days. (Take nirmatrelvir 150 mg two tablets twice daily for 5 days and ritonavir 100 mg one tablet twice daily for 5 days) Patient GFR is > 60 per BMP on 01/25/24 01/31/24 02/05/24  Gladis Elsie BROCKS, PA-C  senna (SENOKOT) 8.6 MG TABS tablet Take 1 tablet (8.6 mg total) by mouth daily. Patient taking differently: Take 1 tablet by mouth daily as needed for mild constipation.  01/02/23   Christobal Guadalajara, MD  simvastatin  (ZOCOR ) 10 MG tablet TAKE 1 TABLET BY MOUTH EVERY DAY 06/27/23   Panosh, Wanda K, MD    Allergies: Keflex  [cephalexin ], Lyrica [pregabalin], Oxytetracycline, and Memantine     Review of Systems  Updated Vital Signs BP (!) 162/89   Pulse 86   Temp 98 F (36.7 C)   Resp 16   SpO2 100%   Physical Exam Vitals reviewed.  Constitutional:      General: She is not in acute distress.    Appearance: Normal appearance.  HENT:     Head: Normocephalic and atraumatic.  Eyes:     Pupils: Pupils are equal, round, and reactive to light.  Pulmonary:     Effort: Pulmonary effort is normal.  Musculoskeletal:        General: Normal range of motion.     Cervical back: Normal range of motion.     Comments: Mild swelling of the left knee.  Good popliteal pulses, warm lower extremities.  Patient able to flex and extend knee.  Neurological:     Mental Status: She is alert.     (all labs ordered are listed, but only abnormal results are displayed) Labs Reviewed - No data to display  EKG: None  Radiology: DG Knee Complete 4 Views Left Result Date: 02/02/2024 CLINICAL DATA:  Left knee pain following fall yesterday, initial encounter EXAM: LEFT KNEE - COMPLETE 4+ VIEW COMPARISON:  None Available. FINDINGS: No evidence of fracture, dislocation, or joint effusion. No evidence of arthropathy or other focal bone abnormality. Soft tissues are unremarkable. IMPRESSION: No acute abnormality noted. Electronically Signed   By: Oneil Devonshire M.D.   On: 02/02/2024 19:05   DG Chest Port 1 View Result Date: 02/01/2024 CLINICAL DATA:  sob EXAM: PORTABLE CHEST - 1 VIEW COMPARISON:  July 06, 2023 FINDINGS: Subtle, patchy airspace opacities in the left lung base. No pneumothorax or pleural effusion. No cardiomegaly.Aortic atherosclerosis.No acute fracture or destructive lesion. IMPRESSION: Subtle patchy airspace opacities in the left lung base, which may represent atelectasis or a  developing bronchopneumonia, in the correct clinical context. Electronically Signed   By: Rogelia Myers M.D.   On: 02/01/2024 10:48     Procedures   Medications Ordered in the ED  acetaminophen  (TYLENOL ) tablet 1,000 mg (1,000 mg Oral Patient Refused/Not Given 02/02/24 1958)                                    Medical Decision Making 89 year old female here today with some left knee pain.  Differential diagnoses include knee sprain, consider tibial plateau fracture, consider meniscal injury.  Plan-patient with some swelling of that left knee.  Her plain films are negative.  She has no tenderness over the tibial plateau.  She has not taken any medications.  I provided her with some Tylenol .  Low suspicion for a vascular injury given reassuring exam.  Patient a bit feisty, tells me she would like to leave.  I think that is reasonable given her reassuring workup and imaging.  Counseled the patient on RICE.  Will provide orthopedic follow-up.  Amount and/or Complexity of Data Reviewed Radiology: ordered.  Risk OTC drugs.        Final diagnoses:  Sprain of left knee, unspecified ligament, initial encounter    ED Discharge Orders     None          [name], DO 02/02/24 2005

## 2024-02-02 NOTE — Telephone Encounter (Signed)
 Patient called back again. Nurse addended previous telephone encounter, then triaged new symptoms.

## 2024-02-02 NOTE — Discharge Instructions (Addendum)
 Your x-ray is negative.  Please follow-up with your primary care doctor.  You may take Tylenol  at home.  I have included a referral to an orthopedic doctor.  You may call them next week for a follow-up appointment.

## 2024-02-02 NOTE — Telephone Encounter (Signed)
 Copied from CRM [id number]. Topic: Clinical - Red Word Triage >> Feb 02, 2024  4:56 PM Alfonso ORN wrote: Red Word that prompted transfer to Nurse Triage: pt stated her left leg is in bad shape and she has been unable to walk for the past 24 hours.

## 2024-02-08 ENCOUNTER — Emergency Department (HOSPITAL_COMMUNITY)

## 2024-02-08 ENCOUNTER — Emergency Department (HOSPITAL_COMMUNITY)
Admission: EM | Admit: 2024-02-08 | Discharge: 2024-02-08 | Disposition: A | Discharge status: Skilled Nursing Facility | Source: Skilled Nursing Facility | Attending: Emergency Medicine | Admitting: Emergency Medicine

## 2024-02-08 DIAGNOSIS — R0602 Shortness of breath: Secondary | ICD-10-CM | POA: Diagnosis not present

## 2024-02-08 DIAGNOSIS — I491 Atrial premature depolarization: Secondary | ICD-10-CM | POA: Diagnosis not present

## 2024-02-08 DIAGNOSIS — G311 Senile degeneration of brain, not elsewhere classified: Secondary | ICD-10-CM | POA: Insufficient documentation

## 2024-02-08 DIAGNOSIS — G9389 Other specified disorders of brain: Secondary | ICD-10-CM | POA: Diagnosis not present

## 2024-02-08 DIAGNOSIS — R531 Weakness: Secondary | ICD-10-CM | POA: Diagnosis not present

## 2024-02-08 DIAGNOSIS — F0390 Unspecified dementia without behavioral disturbance: Secondary | ICD-10-CM | POA: Diagnosis not present

## 2024-02-08 DIAGNOSIS — R059 Cough, unspecified: Secondary | ICD-10-CM | POA: Diagnosis not present

## 2024-02-08 DIAGNOSIS — Z20822 Contact with and (suspected) exposure to covid-19: Secondary | ICD-10-CM | POA: Diagnosis not present

## 2024-02-08 DIAGNOSIS — R42 Dizziness and giddiness: Secondary | ICD-10-CM | POA: Diagnosis not present

## 2024-02-08 LAB — COMPREHENSIVE METABOLIC PANEL WITH GFR
ALT: 9 U/L (ref 0–44)
AST: 19 U/L (ref 15–41)
Albumin: 4 g/dL (ref 3.5–5.0)
Alkaline Phosphatase: 94 U/L (ref 38–126)
Anion gap: 10 (ref 5–15)
BUN: 13 mg/dL (ref 8–23)
CO2: 25 mmol/L (ref 22–32)
Calcium: 9.7 mg/dL (ref 8.9–10.3)
Chloride: 100 mmol/L (ref 98–111)
Creatinine, Ser: 0.7 mg/dL (ref 0.44–1.00)
GFR, Estimated: >60 mL/min (ref >60)
Glucose, Bld: 98 mg/dL (ref 70–99)
Potassium: 4.5 mmol/L (ref 3.5–5.1)
Sodium: 136 mmol/L (ref 135–145)
Total Bilirubin: 0.7 mg/dL (ref 0.0–1.2)
Total Protein: 6.9 g/dL (ref 6.5–8.1)

## 2024-02-08 LAB — URINALYSIS, ROUTINE W REFLEX MICROSCOPIC
Bilirubin Urine: NEGATIVE
Glucose, UA: NEGATIVE mg/dL
Hgb urine dipstick: NEGATIVE
Ketones, ur: NEGATIVE mg/dL
Leukocytes,Ua: NEGATIVE
Nitrite: NEGATIVE
Protein, ur: NEGATIVE mg/dL
Specific Gravity, Urine: 1.01 (ref 1.005–1.030)
pH: 8 (ref 5.0–8.0)

## 2024-02-08 LAB — CBC WITH DIFFERENTIAL/PLATELET
Abs Immature Granulocytes: 0.01 K/uL (ref 0.00–0.07)
Basophils Absolute: 0.1 K/uL (ref 0.0–0.1)
Basophils Relative: 2 %
Eosinophils Absolute: 0.4 K/uL (ref 0.0–0.5)
Eosinophils Relative: 6 %
HCT: 39.5 % (ref 36.0–46.0)
Hemoglobin: 12.9 g/dL (ref 12.0–15.0)
Immature Granulocytes: 0 %
Lymphocytes Relative: 24 %
Lymphs Abs: 1.6 K/uL (ref 0.7–4.0)
MCH: 30 pg (ref 26.0–34.0)
MCHC: 32.7 g/dL (ref 30.0–36.0)
MCV: 91.9 fL (ref 80.0–100.0)
Monocytes Absolute: 0.5 K/uL (ref 0.1–1.0)
Monocytes Relative: 8 %
Neutro Abs: 3.9 K/uL (ref 1.7–7.7)
Neutrophils Relative %: 60 %
Platelets: 263 K/uL (ref 150–400)
RBC: 4.3 MIL/uL (ref 3.87–5.11)
RDW: 12.2 % (ref 11.5–15.5)
WBC: 6.4 K/uL (ref 4.0–10.5)
nRBC: 0 % (ref 0.0–0.2)

## 2024-02-08 LAB — CBG MONITORING, ED: Glucose-Capillary: 92 mg/dL (ref 70–99)

## 2024-02-08 LAB — TROPONIN T, HIGH SENSITIVITY
Troponin T High Sensitivity: <15 ng/L (ref 0–19)
Troponin T High Sensitivity: <15 ng/L (ref 0–19)

## 2024-02-08 MED ORDER — LACTATED RINGERS IV BOLUS
1000.0000 mL | Freq: Once | INTRAVENOUS | Status: AC
  Administered 2024-02-08: 1000 mL via INTRAVENOUS

## 2024-02-08 NOTE — ED Notes (Signed)
 [facility] called at this time to arrange transport

## 2024-02-08 NOTE — ED Triage Notes (Signed)
 Pt arrived via EMS from [facility] for weakness. Pt stated she was recently exposed to COVID.   20g left hand 150/80 70hr 96% ra 119cbg 98.4

## 2024-02-08 NOTE — ED Provider Notes (Signed)
 Past Medical History:  Diagnosis Date   Arthritis    Cataract 2007   bilateral cat ext    Cholecystitis    Complication of anesthesia Feb 21, 2014   atrial fib after cholecystectomy, lasted a few hours, none since   Dysrhythmia 02-21-2014   after cholecystectomy had atrial fib for a few hours, none since   GERD (gastroesophageal reflux disease)    Had endoscopy no history of Barrett's   Hx of nonmelanoma skin cancer    Unspecified hereditary and idiopathic peripheral neuropathy     Physical Exam  BP (!) 145/72   Pulse 64   Temp 98.3 F (36.8 C) (Oral)   Resp 18   SpO2 98%   Physical Exam  Procedures  Procedures  ED Course / MDM    Medical Decision Making Amount and/or Complexity of Data Reviewed Labs: ordered. Radiology: ordered.   89 year old female with a history of recent COVID 10/8, here for generalized weakness.  Labs are completed and showed no evidence of ACS, no sign of UTI, no clinically significant electrolyte abnormalities nor anemia.  EKGs was without arrhythmia.  Chest x-ray was without pneumonia.  CT head pending.   CT head completed and personally evaluated interpreted by me and radiology and shows***no signs of intracranial hemorrhage or other acute abnormalities.

## 2024-02-08 NOTE — ED Notes (Signed)
 [facility] here for transportation back to [facility].  All personal belongings transported with patient.

## 2024-02-08 NOTE — ED Provider Notes (Signed)
 [place] EMERGENCY DEPARTMENT AT [facility] Provider Note  CSN: [id number] Arrival date & time: 02/08/24 1004  Chief Complaint(s) Weakness  HPI Susan Ford is a 89 y.o. female who is here today for weakness.  Patient was recently exposed to COVID.  She denies any fever, cough or congestion.  She denies any pain in her abdomen, pain with urination, diarrhea or difficulty walking.   Past Medical History Past Medical History:  Diagnosis Date   Arthritis    Cataract 2007   bilateral cat ext    Cholecystitis    Complication of anesthesia Feb 21, 2014   atrial fib after cholecystectomy, lasted a few hours, none since   Dysrhythmia 02-21-2014   after cholecystectomy had atrial fib for a few hours, none since   GERD (gastroesophageal reflux disease)    Had endoscopy no history of Barrett's   Hx of nonmelanoma skin cancer    Unspecified hereditary and idiopathic peripheral neuropathy    Patient Active Problem List   Diagnosis Date Noted   Influenza A 07/08/2023   Physical deconditioning 07/06/2023   ABLA (acute blood loss anemia) 30-Apr-2023   History of bleeding peptic ulcer 30-Apr-2023   History of pulmonary embolism, 12/28/22 04-30-23   Chronic anticoagulation 2023/04/30   Acute GI bleeding 30-Apr-2023   Acute UTI Apr 30, 2023   Pulmonary embolism (HCC) 12/28/2022   Diarrhea 12/28/2022   Melena, with acute blood loss anemia 07/08/2022   Dementia without behavioral disturbance (HCC) 10/03/2017   Gait abnormality 06/29/2016   Chronic bilateral low back pain without sciatica 06/29/2016   Memory loss 11/02/2015   Death of husband 04-29-2014   Bereavement due to life event 2014/04/29   Absolute anemia 04/08/2014   Atrial flutter (HCC) 02/23/2014   Acute calculous cholecystitis 02/21/2014   Spinal stenosis of lumbar region 01/04/2014   Recent bereavement 10/25/2013   Arthritis 10/25/2013   Hyperlipidemia 10/23/2013   Hereditary and idiopathic peripheral  neuropathy    Nausea alone 07/14/2012   Neuropathy, peripheral 05/05/2011   High risk medication use 05/05/2011   Subcutaneous emphysema 05/05/2011   Cough 04/03/2011   Neuropathy 04/03/2011   Osteoarthritis 05/15/2010   STIFFNESS OF JOINT NEC ANKLE AND FOOT 05/15/2010   GANGLION CYST, WRIST, RIGHT 05/15/2010   VERTIGO, BENIGN PAROXYSMAL POSITION 01/31/2007   Home Medication(s) Prior to Admission medications   Medication Sig Start Date End Date Taking? Authorizing Provider  acetaminophen  (TYLENOL ) 325 MG tablet Take 650 mg by mouth every 6 (six) hours as needed for moderate pain (pain score 4-6) or mild pain (pain score 1-3).    [provider]  Apoaequorin (PREVAGEN) 10 MG CAPS Take 10 mg by mouth daily.    [provider]  Ascorbic Acid  (VITAMIN C ) 1000 MG tablet Take 1,000 mg by mouth daily.    [provider]  doxycycline (VIBRAMYCIN) 100 MG capsule Take 1 capsule (100 mg total) by mouth 2 (two) times daily. One po bid x 7 days 02/01/24   Emil Share, DO  gabapentin  (NEURONTIN ) 300 MG capsule TAKE 1 CAPSULE BY MOUTH EVERYDAY AT BEDTIME Patient taking differently: Take 300 mg by mouth as needed (Pain). 11/14/23   Panosh, Wanda K, MD  Multiple Vitamin (MULTIVITAMIN) tablet Take 1 tablet by mouth daily after lunch.    [provider]  senna (SENOKOT) 8.6 MG TABS tablet Take 1 tablet (8.6 mg total) by mouth daily. Patient taking differently: Take 1 tablet by mouth daily as needed for mild constipation. 01/02/23  Christobal Guadalajara, MD  simvastatin  (ZOCOR ) 10 MG tablet TAKE 1 TABLET BY MOUTH EVERY DAY 06/27/23   Panosh, [name], MD                                                                                                                                    Past Surgical History Past Surgical History:  Procedure Laterality Date   ABDOMINAL HYSTERECTOMY  15 yrs ago   complete   BIOPSY  07/09/2022   Procedure: BIOPSY;  Surgeon: Elicia Claw, MD;  Location: [facility]  ENDOSCOPY;  Service: Gastroenterology;;   BIOPSY  04/13/2023   Procedure: BIOPSY;  Surgeon: Rosalie Kitchens, MD;  Location: [facility] ENDOSCOPY;  Service: Gastroenterology;;   BREAST CYST ASPIRATION     CHOLECYSTECTOMY N/A 02/21/2014   Procedure: LAPAROSCOPIC CHOLECYSTECTOMY WITH INTRAOPERATIVE CHOLANGIOGRAM;  Surgeon: Gordy Pina, MD;  Location: [facility] OR;  Service: General;  Laterality: N/A;   ENDOSCOPIC RETROGRADE CHOLANGIOPANCREATOGRAPHY (ERCP) WITH PROPOFOL  N/A 05/29/2014   Procedure: ENDOSCOPIC RETROGRADE CHOLANGIOPANCREATOGRAPHY (ERCP) WITH PROPOFOL ;  Surgeon: Elsie Cree, MD;  Location: [facility] ENDOSCOPY;  Service: Endoscopy;  Laterality: N/A;   ERCP N/A 02/24/2014   Procedure: ENDOSCOPIC RETROGRADE CHOLANGIOPANCREATOGRAPHY (ERCP);  Surgeon: Elsie Cree, MD;  Location: [facility] ENDOSCOPY;  Service: Endoscopy;  Laterality: N/A;   ESOPHAGOGASTRODUODENOSCOPY (EGD) WITH PROPOFOL  N/A 07/09/2022   Procedure: ESOPHAGOGASTRODUODENOSCOPY (EGD) WITH PROPOFOL ;  Surgeon: Elicia Claw, MD;  Location: [facility] ENDOSCOPY;  Service: Gastroenterology;  Laterality: N/A;   ESOPHAGOGASTRODUODENOSCOPY (EGD) WITH PROPOFOL  N/A 04/13/2023   Procedure: ESOPHAGOGASTRODUODENOSCOPY (EGD) WITH PROPOFOL ;  Surgeon: Rosalie Kitchens, MD;  Location: [facility] ENDOSCOPY;  Service: Gastroenterology;  Laterality: N/A;   EYE SURGERY Bilateral 2007   both eyes cataracts with lens replacments   LUMBAR LAMINECTOMY/DECOMPRESSION MICRODISCECTOMY  02/01/2012   Procedure: LUMBAR LAMINECTOMY/DECOMPRESSION MICRODISCECTOMY 2 LEVELS;  Surgeon: Victory Gens, MD;  Location: [facility] NEURO ORS;  Service: Neurosurgery;  Laterality: Left;  Left Lumbar Four-Five Lumbar Five-Sacral One Laminectomies/Foraminotomies/Microscope   SPYGLASS CHOLANGIOSCOPY N/A 05/29/2014   Procedure: SPYGLASS CHOLANGIOSCOPY;  Surgeon: Elsie Cree, MD;  Location: [facility] ENDOSCOPY;  Service: Endoscopy;  Laterality: N/A;   TONSILLECTOMY  age 55   Family History Family History  Problem Relation Age of Onset    Neurodegenerative disease Mother        Shy Drager died at 36   Alcohol  abuse Other    Heart disease Other     Social History Social History   Tobacco Use   Smoking status: Former    Current packs/day: 0.00    Average packs/day: 0.3 packs/day for 7.0 years (1.8 ttl pk-yrs)    Types: Cigarettes    Start date: 04/26/1956    Quit date: 04/27/1963    Years since quitting: 60.8   Smokeless tobacco: Never  Vaping Use   Vaping status: Never Used  Substance Use Topics   Alcohol  use: Yes    Alcohol /week: 7.0 standard drinks of alcohol     Types: 7 Glasses of wine  per week    Comment: 1 glass of wine daily   Drug use: No    Types: Hydrocodone    Allergies Keflex  [cephalexin ], Lyrica [pregabalin], Oxytetracycline, and Memantine   Review of Systems Review of Systems  Physical Exam Vital Signs  I have reviewed the triage vital signs BP (!) 145/72   Pulse 64   Temp 98.3 F (36.8 C) (Oral)   Resp 18   SpO2 98%   Physical Exam Vitals and nursing note reviewed.  Constitutional:      Appearance: Normal appearance. She is not toxic-appearing.  HENT:     Head: Normocephalic and atraumatic.     Mouth/Throat:     Mouth: Mucous membranes are moist.  Eyes:     Pupils: Pupils are equal, round, and reactive to light.  Cardiovascular:     Rate and Rhythm: Normal rate.     Pulses: Normal pulses.  Pulmonary:     Effort: Pulmonary effort is normal.  Abdominal:     General: Abdomen is flat. There is no distension.     Palpations: Abdomen is soft.     Tenderness: There is no abdominal tenderness. There is no guarding.  Musculoskeletal:        General: Normal range of motion.     Cervical back: Normal range of motion.  Neurological:     General: No focal deficit present.     Mental Status: She is alert and oriented to person, place, and time.     Cranial Nerves: No cranial nerve deficit.     Motor: No weakness.     ED Results and Treatments Labs (all labs ordered are listed, but  only abnormal results are displayed) Labs Reviewed  URINALYSIS, ROUTINE W REFLEX MICROSCOPIC - Abnormal; Notable for the following components:      Result Value   APPearance CLOUDY (*)    All other components within normal limits  COMPREHENSIVE METABOLIC PANEL WITH GFR  CBC WITH DIFFERENTIAL/PLATELET  CBG MONITORING, ED  TROPONIN T, HIGH SENSITIVITY  TROPONIN T, HIGH SENSITIVITY                                                                                                                          Radiology DG Chest 1 View Result Date: 02/08/2024 EXAM: 1 VIEW(S) XRAY OF THE CHEST 02/08/2024 11:20:00 AM COMPARISON: 02/01/2024 CLINICAL HISTORY: cough. Per chart: Pt arrived via EMS from [facility] for weakness. Pt stated she was recently exposed to COVID. ; PT did not want to remove artifacts FINDINGS: LUNGS AND PLEURA: Minimal bibasilar atelectasis or scarring is noted. No pulmonary edema. No pleural effusion. No pneumothorax. HEART AND MEDIASTINUM: No acute abnormality of the cardiac and mediastinal silhouettes. BONES AND SOFT TISSUES: No acute osseous abnormality. LIMITATIONS/ARTIFACTS: PT did not want to remove artifacts. IMPRESSION: 1. Minimal bibasilar atelectasis or scarring. Electronically signed by: Lynwood Seip MD 02/08/2024 11:38 AM EDT RP Workstation: [id number]    Pertinent labs & imaging results that were available during  my care of the patient were reviewed by me and considered in my medical decision making (see MDM for details).  Medications Ordered in ED Medications  lactated ringers  bolus 1,000 mL (1,000 mLs Intravenous New Bag/Given 02/08/24 1402)                                                                                                                                     Procedures Procedures  (including critical care time)  Medical Decision Making / ED Course   This patient presents to the ED for concern of generalized weakness., this involves an extensive  number of treatment options, and is a complaint that carries with it a high risk of complications and morbidity.  The differential diagnosis includes electrode abnormalities, dehydration, pneumonia, UTI, subdural hematoma, intracranial hemorrhage, fatigue, postviral syndrome.    MDM: On exam, patient overall looks well.  She has no focal or lateralizing deficits.  She has clear lungs, normal vital signs.  Her EKG, per my independent review shows no evidence of acute ischemia.  Obtained CBC on the patient with normal hemoglobin.  Her renal function and electrolytes are normal.  Her urine does not show any evidence of infection.  Her chest x-ray is negative.  Initial troponin negative.  Patient still pending delta troponin, and CT head.  She will be signed out to Dr. Dreama pending CT imaging of the head and delta troponin.  Discussed patient's so far reassuring workup.  If remaining imaging is negative, patient feels comfortable returning to her skilled nursing facility.  Additional history obtained: -Additional history obtained from EMS my independent review of the patient's EKG shows no ST segment depressions or elevations, no T wave inversions, no evidence of acute ischemia. -External records from outside source obtained and reviewed including: Chart review including previous notes, labs, imaging, consultation notes   Lab Tests: -I ordered, reviewed, and interpreted labs.   The pertinent results include:   Labs Reviewed  URINALYSIS, ROUTINE W REFLEX MICROSCOPIC - Abnormal; Notable for the following components:      Result Value   APPearance CLOUDY (*)    All other components within normal limits  COMPREHENSIVE METABOLIC PANEL WITH GFR  CBC WITH DIFFERENTIAL/PLATELET  CBG MONITORING, ED  TROPONIN T, HIGH SENSITIVITY  TROPONIN T, HIGH SENSITIVITY      EKG sinus rhythm, no ST segment depressions or elevations.  EKG Interpretation Date/Time:  Wednesday February 08 2024 10:22:47  EDT Ventricular Rate:  65 PR Interval:  65 QRS Duration:  94 QT Interval:  428 QTC Calculation: 445 R Axis:   12  Text Interpretation: Sinus rhythm Short PR interval Borderline low voltage, extremity leads RSR' in V1 or V2, probably normal variant Confirmed by Mannie Pac [phone]) on 02/08/2024 2:22:11 PM         Imaging Studies ordered: I ordered imaging studies including chest x-ray, CT head I independently visualized and interpreted imaging. I agree  with the radiologist interpretation   Medicines ordered and prescription drug management: Meds ordered this encounter  Medications   lactated ringers  bolus 1,000 mL    -I have reviewed the patients home medicines and have made adjustments as needed  Cardiac Monitoring: The patient was maintained on a cardiac monitor.  I personally viewed and interpreted the cardiac monitored which showed an underlying rhythm of: Normal sinus rhythm  Social Determinants of Health:  Factors impacting patients care include: Lack of access to primary care   Reevaluation: After the interventions noted above, I reevaluated the patient and found that they have :improved  Co morbidities that complicate the patient evaluation  Past Medical History:  Diagnosis Date   Arthritis    Cataract 2007   bilateral cat ext    Cholecystitis    Complication of anesthesia Feb 21, 2014   atrial fib after cholecystectomy, lasted a few hours, none since   Dysrhythmia 02-21-2014   after cholecystectomy had atrial fib for a few hours, none since   GERD (gastroesophageal reflux disease)    Had endoscopy no history of Barrett's   Hx of nonmelanoma skin cancer    Unspecified hereditary and idiopathic peripheral neuropathy       Final Clinical Impression(s) / ED Diagnoses Final diagnoses:  Weakness     @PCDICTATION @    Mannie Pac T, DO 02/08/24 1447

## 2024-02-08 NOTE — ED Notes (Signed)
 Patient reports she is feeling better.  Requesting a snack and a drink.  Snack and drink provided to patient.  Patient made aware that she is currently waiting for transportation

## 2024-02-08 NOTE — ED Notes (Signed)
 Report called to [facility] at this time.  Report given at this time.  Patient resides in [facility] Living.

## 2024-02-08 NOTE — Discharge Instructions (Addendum)
 While you were in the emergency room, you had blood work done that was normal.  Your urine does not have any signs of infection.  Your chest x-ray did not show pneumonia.  At this time, we do not of a clear cause for your symptoms.  Sometimes after people have a viral illness, they can feel unwell for a couple of weeks.  Continue taking all medications as prescribed, get plenty to eat and drink, and get a good night of sleep.  Follow-up with your primary care doctor within 1 week.

## 2024-02-09 ENCOUNTER — Inpatient Hospital Stay: Admitting: Internal Medicine

## 2024-02-14 ENCOUNTER — Other Ambulatory Visit: Payer: Self-pay | Admitting: Internal Medicine

## 2024-02-27 ENCOUNTER — Telehealth: Payer: Self-pay

## 2024-02-27 NOTE — Telephone Encounter (Signed)
 Copied from CRM [id number]. Topic: General - Transportation >> Feb 27, 2024  7:53 AM Pinkey ORN wrote: Reason for CRM: Transportation

## 2024-02-27 NOTE — Telephone Encounter (Unsigned)
 Copied from CRM [id number]. Topic: General - Transportation >> Feb 27, 2024  7:53 AM Pinkey ORN wrote: Reason for CRM: Transportation >> Feb 27, 2024  7:56 AM Pinkey ORN wrote: Patient called in, states she's trying to make arrangements in regards to transportation pick up for her upcoming appointment. Please follow up with her.

## 2024-02-28 NOTE — Telephone Encounter (Signed)
 Front desk is unable to arrange the transportation as it is less than 48 hours notice. Inform Susan Ford, will contact pt to schedule the lab appt then will message her to contact pt.    Attempted to reach pt. Left a voicemail to call us  back.

## 2024-02-29 ENCOUNTER — Other Ambulatory Visit

## 2024-02-29 ENCOUNTER — Other Ambulatory Visit (INDEPENDENT_AMBULATORY_CARE_PROVIDER_SITE_OTHER)

## 2024-02-29 DIAGNOSIS — E871 Hypo-osmolality and hyponatremia: Secondary | ICD-10-CM

## 2024-02-29 LAB — BASIC METABOLIC PANEL WITH GFR
BUN: 15 mg/dL (ref 6–23)
CO2: 29 meq/L (ref 19–32)
Calcium: 9.2 mg/dL (ref 8.4–10.5)
Chloride: 99 meq/L (ref 96–112)
Creatinine, Ser: 0.79 mg/dL (ref 0.40–1.20)
GFR: 64.82 mL/min (ref >60.00)
Glucose, Bld: 96 mg/dL (ref 70–99)
Potassium: 3.8 meq/L (ref 3.5–5.1)
Sodium: 134 meq/L — ABNORMAL LOW (ref 135–145)

## 2024-03-02 ENCOUNTER — Other Ambulatory Visit

## 2024-03-04 ENCOUNTER — Other Ambulatory Visit: Payer: Self-pay

## 2024-03-04 ENCOUNTER — Encounter (HOSPITAL_COMMUNITY): Payer: Self-pay

## 2024-03-04 ENCOUNTER — Emergency Department (HOSPITAL_COMMUNITY)
Admission: EM | Admit: 2024-03-04 | Discharge: 2024-03-04 | Disposition: A | Discharge status: Home / Self Care | Source: Skilled Nursing Facility | Attending: Emergency Medicine | Admitting: Emergency Medicine

## 2024-03-04 DIAGNOSIS — I10 Essential (primary) hypertension: Secondary | ICD-10-CM | POA: Diagnosis not present

## 2024-03-04 DIAGNOSIS — N3941 Urge incontinence: Secondary | ICD-10-CM | POA: Diagnosis present

## 2024-03-04 DIAGNOSIS — R32 Unspecified urinary incontinence: Secondary | ICD-10-CM | POA: Diagnosis not present

## 2024-03-04 DIAGNOSIS — R531 Weakness: Secondary | ICD-10-CM | POA: Diagnosis not present

## 2024-03-04 LAB — URINALYSIS, ROUTINE W REFLEX MICROSCOPIC
Bacteria, UA: NONE SEEN
Bilirubin Urine: NEGATIVE
Glucose, UA: NEGATIVE mg/dL
Hgb urine dipstick: NEGATIVE
Ketones, ur: NEGATIVE mg/dL
Nitrite: NEGATIVE
Protein, ur: NEGATIVE mg/dL
Specific Gravity, Urine: 1.009 (ref 1.005–1.030)
pH: 7 (ref 5.0–8.0)

## 2024-03-04 LAB — COMPREHENSIVE METABOLIC PANEL WITH GFR
ALT: 11 U/L (ref 0–44)
AST: 19 U/L (ref 15–41)
Albumin: 3.8 g/dL (ref 3.5–5.0)
Alkaline Phosphatase: 83 U/L (ref 38–126)
Anion gap: 8 (ref 5–15)
BUN: 14 mg/dL (ref 8–23)
CO2: 26 mmol/L (ref 22–32)
Calcium: 9.3 mg/dL (ref 8.9–10.3)
Chloride: 101 mmol/L (ref 98–111)
Creatinine, Ser: 0.81 mg/dL (ref 0.44–1.00)
GFR, Estimated: >60 mL/min (ref >60)
Glucose, Bld: 102 mg/dL — ABNORMAL HIGH (ref 70–99)
Potassium: 4.3 mmol/L (ref 3.5–5.1)
Sodium: 136 mmol/L (ref 135–145)
Total Bilirubin: 0.7 mg/dL (ref 0.0–1.2)
Total Protein: 6.7 g/dL (ref 6.5–8.1)

## 2024-03-04 LAB — CBC
HCT: 37.7 % (ref 36.0–46.0)
Hemoglobin: 12.6 g/dL (ref 12.0–15.0)
MCH: 31.2 pg (ref 26.0–34.0)
MCHC: 33.4 g/dL (ref 30.0–36.0)
MCV: 93.3 fL (ref 80.0–100.0)
Platelets: 271 K/uL (ref 150–400)
RBC: 4.04 MIL/uL (ref 3.87–5.11)
RDW: 12.4 % (ref 11.5–15.5)
WBC: 7.8 K/uL (ref 4.0–10.5)
nRBC: 0 % (ref 0.0–0.2)

## 2024-03-04 LAB — RESP PANEL BY RT-PCR (RSV, FLU A&B, COVID)  RVPGX2
Influenza A by PCR: NEGATIVE
Influenza B by PCR: NEGATIVE
Resp Syncytial Virus by PCR: NEGATIVE
SARS Coronavirus 2 by RT PCR: NEGATIVE

## 2024-03-04 LAB — LIPASE, BLOOD: Lipase: 35 U/L (ref 11–51)

## 2024-03-04 MED ORDER — VIBEGRON 75 MG PO TABS: 1.0000 | ORAL_TABLET | Freq: Every day | ORAL | 1 refills | Status: DC

## 2024-03-04 NOTE — Discharge Instructions (Addendum)
 Please follow-up with your primary care in the next week for continued management of your incontinence.

## 2024-03-04 NOTE — ED Notes (Signed)
 PT[facility] called

## 2024-03-04 NOTE — ED Notes (Signed)
 Patient given coke, peanut butter and crackers for PO challenge.

## 2024-03-04 NOTE — ED Provider Notes (Signed)
 [place] EMERGENCY DEPARTMENT AT [facility] Provider Note   CSN: [id number] Arrival date & time: 03/04/24  9073     Patient presents with: Urinary Frequency   Susan Ford is a 89 y.o. female whose primary concern today is increased urinary frequency as well as increased urgency.  States that she is experiencing urge incontinence, this has been getting progressively worse over the last 2 weeks.  Also has had progressive increase in generalized weakness and fatigue that has been worsening over the last several days.  Impetus for visit today is that she noted that she was increasingly weak and needed assistance, her facility called an ambulance for her and referred her to the ED for evaluation secondary to urinary frequency and weakness.  She has been able to eat or drink since this morning, though does endorse appetite and desire to drink fluids.    Urinary Frequency       Prior to Admission medications   Medication Sig Start Date End Date Taking? Authorizing Provider  Vibegron 75 MG TABS Take 1 tablet (75 mg total) by mouth daily. 03/04/24 05/03/24 Yes Myriam Dorn BROCKS, PA  acetaminophen  (TYLENOL ) 325 MG tablet Take 650 mg by mouth every 6 (six) hours as needed for moderate pain (pain score 4-6) or mild pain (pain score 1-3).    [provider]  Apoaequorin (PREVAGEN) 10 MG CAPS Take 10 mg by mouth daily.    [provider]  Ascorbic Acid  (VITAMIN C ) 1000 MG tablet Take 1,000 mg by mouth daily.    [provider]  doxycycline (VIBRAMYCIN) 100 MG capsule Take 1 capsule (100 mg total) by mouth 2 (two) times daily. One po bid x 7 days 02/01/24   Emil Share, DO  gabapentin  (NEURONTIN ) 300 MG capsule TAKE 1 CAPSULE BY MOUTH EVERYDAY AT BEDTIME Patient taking differently: Take 300 mg by mouth as needed (Pain). 11/14/23   Panosh, Wanda K, MD  Multiple Vitamin (MULTIVITAMIN) tablet Take 1 tablet by mouth daily after lunch.    [provider]  senna (SENOKOT) 8.6 MG TABS tablet Take 1 tablet (8.6 mg total) by mouth daily. Patient taking differently: Take 1 tablet by mouth daily as needed for mild constipation. 01/02/23   Christobal Guadalajara, MD  simvastatin  (ZOCOR ) 10 MG tablet TAKE 1 TABLET BY MOUTH EVERY DAY 02/15/24   Panosh, Wanda K, MD    Allergies: Keflex  [cephalexin ], Lyrica [pregabalin], Oxytetracycline, and Memantine     Review of Systems  Constitutional:  Positive for fatigue.  Genitourinary:  Positive for frequency and urgency.  All other systems reviewed and are negative.   Updated Vital Signs BP (!) 167/97   Pulse 82   Temp 98 F (36.7 C)   Resp 18   SpO2 96%   Physical Exam Vitals and nursing note reviewed.  Constitutional:      General: She is not in acute distress.    Appearance: Normal appearance.  HENT:     Head: Normocephalic and atraumatic.     Mouth/Throat:     Mouth: Mucous membranes are moist.     Pharynx: Oropharynx is clear.  Eyes:     Extraocular Movements: Extraocular movements intact.     Conjunctiva/sclera: Conjunctivae normal.     Pupils: Pupils are equal, round, and reactive to light.  Cardiovascular:     Rate and Rhythm: Normal rate and regular rhythm.     Pulses: Normal pulses.     Heart sounds: Normal heart sounds. No murmur heard.  No friction rub. No gallop.  Pulmonary:     Effort: Pulmonary effort is normal.     Breath sounds: Normal breath sounds.  Abdominal:     General: Abdomen is flat. Bowel sounds are normal.     Palpations: Abdomen is soft.     Tenderness: There is abdominal tenderness in the left lower quadrant.     Comments: Mild point tenderness to the left lower quadrant.  Musculoskeletal:        General: Normal range of motion.     Cervical back: Normal range of motion and neck supple.     Right lower leg: No edema.     Left lower leg: No edema.  Skin:    General: Skin is warm and dry.     Capillary Refill: Capillary refill takes less than 2 seconds.   Neurological:     General: No focal deficit present.     Mental Status: She is alert and oriented to person, place, and time. Mental status is at baseline.     GCS: GCS eye subscore is 4. GCS verbal subscore is 5. GCS motor subscore is 6.  Psychiatric:        Mood and Affect: Mood normal.     (all labs ordered are listed, but only abnormal results are displayed) Labs Reviewed  COMPREHENSIVE METABOLIC PANEL WITH GFR - Abnormal; Notable for the following components:      Result Value   Glucose, Bld 102 (*)    All other components within normal limits  URINALYSIS, ROUTINE W REFLEX MICROSCOPIC - Abnormal; Notable for the following components:   APPearance CLOUDY (*)    Leukocytes,Ua TRACE (*)    All other components within normal limits  RESP PANEL BY RT-PCR (RSV, FLU A&B, COVID)  RVPGX2  LIPASE, BLOOD  CBC    EKG: None  Radiology: No results found.   Procedures   Medications Ordered in the ED - No data to display                                  Medical Decision Making Amount and/or Complexity of Data Reviewed Labs: ordered.  Risk Prescription drug management.   Medical Decision Making:   [name] is a 89 y.o. female who presented to the ED today with increased urinary frequency detailed above.     Complete initial physical exam performed, notably the patient  was alert and oriented in no apparent distress.  Physical exam was largely unremarkable other than mild lower quadrant tenderness..    Reviewed and confirmed nursing documentation for past medical history, family history, social history.    Initial Assessment:   With the patient's presentation of increased urinary frequency/urgency, consider possible UTI.  Also consider urge incontinence, stress incontinence.  Pelvic floor dysfunction.   Initial Plan:   Screening labs including CBC and Metabolic panel to evaluate for infectious or metabolic etiology of disease.  Add serum lipase secondary to  abdominal pain. Urinalysis with reflex culture ordered to evaluate for UTI or relevant urologic/nephrologic pathology.  Objective evaluation as below reviewed   Initial Study Results:   Laboratory  All laboratory results reviewed without evidence of clinically relevant pathology.   Exceptions include: None   Reassessment and Plan:   Examination of urine does not show any remarkable findings, and there is no leukocytosis or any other findings in the workup to be suggestive of any other physiologic cause  of urinary increased frequency and urgency.  Secondary to generalized weakness and fatigue, a nasopharyngeal swab for respiratory panel was obtained which is negative in that regards.  Given the normal findings on exam, and an otherwise unremarkable workup, find this is likely urge incontinence I will manage with vibegron.  Plan is at this time to follow-up with primary care as she is able to tolerate oral liquids and food without difficulty.  This was discussed with the patient and she understands agrees has no further concerns at this time.  As she is stable and tolerating oral intake, no concerning findings on workup or exam, will discharge with outpatient management as previously noted.       Final diagnoses:  Urge incontinence of urine    ED Discharge Orders          Ordered    Vibegron 75 MG TABS  Daily        03/04/24 1154               Myriam Dorn BROCKS, [place] 03/04/24 1311    Geraldene Hamilton, MD 03/06/24 1323

## 2024-03-04 NOTE — ED Triage Notes (Signed)
 Pt arrives via EMS from [facility]. Pt reports feeling sick for the past two days. She is concerned she might have a uti due to increased urinary frequency. She also endorses nausea and chills. Pt arrives AxOx4.

## 2024-03-20 ENCOUNTER — Other Ambulatory Visit: Payer: Self-pay

## 2024-03-20 ENCOUNTER — Emergency Department (HOSPITAL_COMMUNITY)
Admission: EM | Admit: 2024-03-20 | Discharge: 2024-03-20 | Disposition: A | Discharge status: Home / Self Care | Source: Skilled Nursing Facility | Attending: Emergency Medicine | Admitting: Emergency Medicine

## 2024-03-20 ENCOUNTER — Encounter (HOSPITAL_COMMUNITY): Payer: Self-pay

## 2024-03-20 ENCOUNTER — Emergency Department (HOSPITAL_COMMUNITY)

## 2024-03-20 DIAGNOSIS — I878 Other specified disorders of veins: Secondary | ICD-10-CM | POA: Diagnosis not present

## 2024-03-20 DIAGNOSIS — R000 Tachycardia, unspecified: Secondary | ICD-10-CM | POA: Diagnosis not present

## 2024-03-20 DIAGNOSIS — E785 Hyperlipidemia, unspecified: Secondary | ICD-10-CM | POA: Diagnosis present

## 2024-03-20 DIAGNOSIS — I771 Stricture of artery: Secondary | ICD-10-CM | POA: Diagnosis not present

## 2024-03-20 DIAGNOSIS — K5900 Constipation, unspecified: Secondary | ICD-10-CM | POA: Diagnosis present

## 2024-03-20 DIAGNOSIS — R32 Unspecified urinary incontinence: Secondary | ICD-10-CM | POA: Diagnosis not present

## 2024-03-20 DIAGNOSIS — F0390 Unspecified dementia without behavioral disturbance: Secondary | ICD-10-CM | POA: Diagnosis present

## 2024-03-20 DIAGNOSIS — R9431 Abnormal electrocardiogram [ECG] [EKG]: Secondary | ICD-10-CM | POA: Diagnosis not present

## 2024-03-20 DIAGNOSIS — Z85828 Personal history of other malignant neoplasm of skin: Secondary | ICD-10-CM | POA: Diagnosis not present

## 2024-03-20 DIAGNOSIS — K838 Other specified diseases of biliary tract: Secondary | ICD-10-CM | POA: Diagnosis not present

## 2024-03-20 DIAGNOSIS — M549 Dorsalgia, unspecified: Secondary | ICD-10-CM | POA: Diagnosis not present

## 2024-03-20 DIAGNOSIS — G609 Hereditary and idiopathic neuropathy, unspecified: Secondary | ICD-10-CM | POA: Diagnosis present

## 2024-03-20 DIAGNOSIS — M25552 Pain in left hip: Secondary | ICD-10-CM | POA: Diagnosis present

## 2024-03-20 DIAGNOSIS — K219 Gastro-esophageal reflux disease without esophagitis: Secondary | ICD-10-CM | POA: Diagnosis present

## 2024-03-20 DIAGNOSIS — Z8719 Personal history of other diseases of the digestive system: Secondary | ICD-10-CM | POA: Diagnosis not present

## 2024-03-20 DIAGNOSIS — M50322 Other cervical disc degeneration at C5-C6 level: Secondary | ICD-10-CM | POA: Diagnosis not present

## 2024-03-20 DIAGNOSIS — M47812 Spondylosis without myelopathy or radiculopathy, cervical region: Secondary | ICD-10-CM | POA: Diagnosis not present

## 2024-03-20 DIAGNOSIS — R109 Unspecified abdominal pain: Secondary | ICD-10-CM | POA: Diagnosis not present

## 2024-03-20 DIAGNOSIS — I7090 Unspecified atherosclerosis: Secondary | ICD-10-CM | POA: Diagnosis not present

## 2024-03-20 DIAGNOSIS — S199XXA Unspecified injury of neck, initial encounter: Secondary | ICD-10-CM | POA: Diagnosis not present

## 2024-03-20 DIAGNOSIS — I517 Cardiomegaly: Secondary | ICD-10-CM | POA: Diagnosis not present

## 2024-03-20 DIAGNOSIS — M1990 Unspecified osteoarthritis, unspecified site: Secondary | ICD-10-CM | POA: Diagnosis present

## 2024-03-20 DIAGNOSIS — I700 Atherosclerosis of aorta: Secondary | ICD-10-CM | POA: Diagnosis present

## 2024-03-20 DIAGNOSIS — Z9049 Acquired absence of other specified parts of digestive tract: Secondary | ICD-10-CM | POA: Diagnosis not present

## 2024-03-20 DIAGNOSIS — G8929 Other chronic pain: Secondary | ICD-10-CM | POA: Diagnosis present

## 2024-03-20 DIAGNOSIS — R4182 Altered mental status, unspecified: Secondary | ICD-10-CM | POA: Diagnosis not present

## 2024-03-20 DIAGNOSIS — M47816 Spondylosis without myelopathy or radiculopathy, lumbar region: Secondary | ICD-10-CM | POA: Diagnosis not present

## 2024-03-20 DIAGNOSIS — Z888 Allergy status to other drugs, medicaments and biological substances status: Secondary | ICD-10-CM | POA: Diagnosis not present

## 2024-03-20 DIAGNOSIS — I4892 Unspecified atrial flutter: Secondary | ICD-10-CM | POA: Diagnosis present

## 2024-03-20 DIAGNOSIS — R0902 Hypoxemia: Secondary | ICD-10-CM | POA: Diagnosis not present

## 2024-03-20 DIAGNOSIS — R918 Other nonspecific abnormal finding of lung field: Secondary | ICD-10-CM | POA: Diagnosis not present

## 2024-03-20 DIAGNOSIS — K6289 Other specified diseases of anus and rectum: Secondary | ICD-10-CM | POA: Diagnosis present

## 2024-03-20 DIAGNOSIS — S32591A Other specified fracture of right pubis, initial encounter for closed fracture: Secondary | ICD-10-CM | POA: Diagnosis not present

## 2024-03-20 DIAGNOSIS — Z79899 Other long term (current) drug therapy: Secondary | ICD-10-CM | POA: Diagnosis not present

## 2024-03-20 DIAGNOSIS — K5730 Diverticulosis of large intestine without perforation or abscess without bleeding: Secondary | ICD-10-CM | POA: Diagnosis present

## 2024-03-20 DIAGNOSIS — W19XXXA Unspecified fall, initial encounter: Secondary | ICD-10-CM | POA: Diagnosis not present

## 2024-03-20 DIAGNOSIS — Z043 Encounter for examination and observation following other accident: Secondary | ICD-10-CM | POA: Diagnosis not present

## 2024-03-20 DIAGNOSIS — I6782 Cerebral ischemia: Secondary | ICD-10-CM | POA: Diagnosis not present

## 2024-03-20 DIAGNOSIS — K529 Noninfective gastroenteritis and colitis, unspecified: Secondary | ICD-10-CM | POA: Diagnosis not present

## 2024-03-20 DIAGNOSIS — Z86711 Personal history of pulmonary embolism: Secondary | ICD-10-CM | POA: Diagnosis not present

## 2024-03-20 DIAGNOSIS — M545 Low back pain, unspecified: Secondary | ICD-10-CM | POA: Diagnosis present

## 2024-03-20 DIAGNOSIS — I4891 Unspecified atrial fibrillation: Secondary | ICD-10-CM | POA: Diagnosis present

## 2024-03-20 DIAGNOSIS — Z881 Allergy status to other antibiotic agents status: Secondary | ICD-10-CM | POA: Diagnosis not present

## 2024-03-20 DIAGNOSIS — N390 Urinary tract infection, site not specified: Secondary | ICD-10-CM | POA: Diagnosis not present

## 2024-03-20 DIAGNOSIS — Z87891 Personal history of nicotine dependence: Secondary | ICD-10-CM | POA: Diagnosis not present

## 2024-03-20 DIAGNOSIS — Z90710 Acquired absence of both cervix and uterus: Secondary | ICD-10-CM | POA: Diagnosis not present

## 2024-03-20 DIAGNOSIS — K5289 Other specified noninfective gastroenteritis and colitis: Secondary | ICD-10-CM | POA: Diagnosis present

## 2024-03-20 LAB — CBC WITH DIFFERENTIAL/PLATELET
Abs Immature Granulocytes: 0.02 K/uL (ref 0.00–0.07)
Basophils Absolute: 0.1 K/uL (ref 0.0–0.1)
Basophils Relative: 1 %
Eosinophils Absolute: 0.5 K/uL (ref 0.0–0.5)
Eosinophils Relative: 5 %
HCT: 37.2 % (ref 36.0–46.0)
Hemoglobin: 12.2 g/dL (ref 12.0–15.0)
Immature Granulocytes: 0 %
Lymphocytes Relative: 17 %
Lymphs Abs: 1.4 K/uL (ref 0.7–4.0)
MCH: 30.8 pg (ref 26.0–34.0)
MCHC: 32.8 g/dL (ref 30.0–36.0)
MCV: 93.9 fL (ref 80.0–100.0)
Monocytes Absolute: 0.8 K/uL (ref 0.1–1.0)
Monocytes Relative: 9 %
Neutro Abs: 5.8 K/uL (ref 1.7–7.7)
Neutrophils Relative %: 68 %
Platelets: 257 K/uL (ref 150–400)
RBC: 3.96 MIL/uL (ref 3.87–5.11)
RDW: 12.1 % (ref 11.5–15.5)
WBC: 8.6 K/uL (ref 4.0–10.5)
nRBC: 0 % (ref 0.0–0.2)

## 2024-03-20 LAB — URINALYSIS, ROUTINE W REFLEX MICROSCOPIC
Bilirubin Urine: NEGATIVE
Glucose, UA: NEGATIVE mg/dL
Hgb urine dipstick: NEGATIVE
Ketones, ur: NEGATIVE mg/dL
Leukocytes,Ua: NEGATIVE
Nitrite: NEGATIVE
Protein, ur: NEGATIVE mg/dL
Specific Gravity, Urine: 1.01 (ref 1.005–1.030)
pH: 7 (ref 5.0–8.0)

## 2024-03-20 LAB — BASIC METABOLIC PANEL WITH GFR
Anion gap: 10 (ref 5–15)
BUN: 11 mg/dL (ref 8–23)
CO2: 25 mmol/L (ref 22–32)
Calcium: 9.2 mg/dL (ref 8.9–10.3)
Chloride: 101 mmol/L (ref 98–111)
Creatinine, Ser: 0.67 mg/dL (ref 0.44–1.00)
GFR, Estimated: >60 mL/min (ref >60)
Glucose, Bld: 102 mg/dL — ABNORMAL HIGH (ref 70–99)
Potassium: 4.4 mmol/L (ref 3.5–5.1)
Sodium: 135 mmol/L (ref 135–145)

## 2024-03-20 NOTE — ED Triage Notes (Signed)
 Pt BIB EMS from [place] at [facility] due to low back pain and Urinary Incontinence. Pt reports urinary problems is new started last night, chronic back pain has worst since yesterday. AAOx4  BP 156/86 SpO2 94% CBG 138 200 mL LR

## 2024-03-20 NOTE — ED Notes (Signed)
 Labs delayed due to needing IV team.

## 2024-03-20 NOTE — Progress Notes (Signed)
 IV team consult received for IV start- pt noted only have orders for labs (no meds or scans) at this time. [place] to primary RN to contact phlebotomy for assistance.  IV Team unable to assist with lab draws.

## 2024-03-20 NOTE — ED Notes (Signed)
 PTAR contacted.  No ETA, facility contacted and left message with family member as instructed in previous notes.

## 2024-03-20 NOTE — ED Provider Notes (Signed)
 [place] EMERGENCY DEPARTMENT AT [facility] Provider Note   CSN: [id number] Arrival date & time: 03/20/24  9040     Patient presents with: Back Pain and Urinary Incontinence   Susan Ford is a 89 y.o. female.   HPI Patient lives at [facility].  She reports she lives in independent living.  Patient is presenting for urinary incontinence and back pain.  Patient denies she is having pain or burning with urination.  She is however having some loss of control.  No fever chills nausea or vomiting.  Patient reports that she has chronic low back pain and does not note that there has been any significant change in severity of pain.  Reports she has been having a little bit of lower abdominal pain on the left.  She notes it is worse with certain movements.  She denies weakness numbness or tingling of the lower extremities.    Prior to Admission medications   Medication Sig Start Date End Date Taking? Authorizing Provider  acetaminophen  (TYLENOL ) 325 MG tablet Take 650 mg by mouth every 6 (six) hours as needed for moderate pain (pain score 4-6) or mild pain (pain score 1-3).    [provider]  Apoaequorin (PREVAGEN) 10 MG CAPS Take 10 mg by mouth daily.    [provider]  Ascorbic Acid  (VITAMIN C ) 1000 MG tablet Take 1,000 mg by mouth daily.    [provider]  doxycycline  (VIBRAMYCIN ) 100 MG capsule Take 1 capsule (100 mg total) by mouth 2 (two) times daily. One po bid x 7 days 02/01/24   Emil Share, DO  gabapentin  (NEURONTIN ) 300 MG capsule TAKE 1 CAPSULE BY MOUTH EVERYDAY AT BEDTIME Patient taking differently: Take 300 mg by mouth as needed (Pain). 11/14/23   Panosh, Wanda K, MD  Multiple Vitamin (MULTIVITAMIN) tablet Take 1 tablet by mouth daily after lunch.    [provider]  senna (SENOKOT) 8.6 MG TABS tablet Take 1 tablet (8.6 mg total) by mouth daily. Patient taking differently: Take 1 tablet by mouth daily as needed for mild  constipation. 01/02/23   Christobal Guadalajara, MD  simvastatin  (ZOCOR ) 10 MG tablet TAKE 1 TABLET BY MOUTH EVERY DAY 02/15/24   Panosh, Wanda K, MD  Vibegron  75 MG TABS Take 1 tablet (75 mg total) by mouth daily. 03/04/24 05/03/24  Myriam Dorn BROCKS, PA    Allergies: Keflex  [cephalexin ], Lyrica [pregabalin], Oxytetracycline, and Memantine     Review of Systems  Updated Vital Signs BP (!) 166/80 (BP Location: Right Arm)   Pulse 68   Temp 98.1 F (36.7 C) (Oral)   Resp 18   SpO2 96%   Physical Exam Constitutional:      Comments: Alert nontoxic.  No respiratory distress.  Clear mental status.  HENT:     Mouth/Throat:     Pharynx: Oropharynx is clear.  Eyes:     Extraocular Movements: Extraocular movements intact.  Cardiovascular:     Rate and Rhythm: Normal rate and regular rhythm.  Pulmonary:     Effort: Pulmonary effort is normal.     Breath sounds: Normal breath sounds.  Abdominal:     General: There is no distension.     Palpations: Abdomen is soft.     Tenderness: There is no abdominal tenderness. There is no guarding.  Musculoskeletal:        General: No swelling or signs of injury. Normal range of motion.  Skin:    General: Skin is warm and dry.  Neurological:     General: No focal deficit present.     Motor: No weakness.  Psychiatric:        Mood and Affect: Mood normal.     (all labs ordered are listed, but only abnormal results are displayed) Labs Reviewed - No data to display  EKG: None  Radiology: No results found.   Procedures   Medications Ordered in the ED - No data to display                                  Medical Decision Making Amount and/or Complexity of Data Reviewed Labs: ordered. Radiology: ordered.   Presents as outlined with urinary incontinence and back pain.  At this time patient does not reporting significant back pain.  She does describe chronic back pain.  Will proceed with urinalysis CT stone study for possible kidney stone or  obstructive process with previously reported back pain and urinary continence.  CT stone study no nephrolithiasis or obstructive uropathy.  Diverticulosis without diverticulitis.  Urinalysis negative.  Count normal.  GFR greater than 60.  At this time patient is clinically well in appearance.  Vital signs are stable.  She is not immediately having any complaints of severe back pain and abdominal exam is nonacute tender.  UTI ruled out.  Patient's GFR resolved.  She does not have leukocytosis or fever to suggest acute infection.  At this time I feel she is stable for return to the care of her assisted living.  Patient is in agreement with plan.         Final diagnoses:  Urinary incontinence, unspecified type  Chronic midline low back pain without sciatica    ED Discharge Orders     None          Armenta Canning, MD 03/25/24 2345

## 2024-03-20 NOTE — Discharge Instructions (Signed)
 1.  Take exercise Tylenol  for pain.  May use over-the-counter pain patches on the back as needed. 2.  Schedule a follow-up with your family doctor for recheck. 3.  Your urine does not show any signs of infection today.  Your kidney function and lab work were normal today.  You had a CT scan done with no signs of obstruction of the bladder or kidneys.  No kidney stone was seen. 4.  If you have ongoing problems with incontinence, you may need to follow-up with the urologist.  Discuss this with your doctor.

## 2024-03-22 ENCOUNTER — Emergency Department (HOSPITAL_COMMUNITY)

## 2024-03-22 ENCOUNTER — Inpatient Hospital Stay (HOSPITAL_COMMUNITY)
Admission: EM | Admit: 2024-03-22 | Discharge: 2024-03-25 | DRG: 392 | Disposition: A | Discharge status: Skilled Nursing Facility | Attending: Internal Medicine | Admitting: Internal Medicine

## 2024-03-22 ENCOUNTER — Encounter (HOSPITAL_COMMUNITY): Payer: Self-pay | Admitting: Internal Medicine

## 2024-03-22 ENCOUNTER — Other Ambulatory Visit: Payer: Self-pay

## 2024-03-22 DIAGNOSIS — Z85828 Personal history of other malignant neoplasm of skin: Secondary | ICD-10-CM

## 2024-03-22 DIAGNOSIS — I700 Atherosclerosis of aorta: Secondary | ICD-10-CM | POA: Diagnosis present

## 2024-03-22 DIAGNOSIS — M1990 Unspecified osteoarthritis, unspecified site: Secondary | ICD-10-CM | POA: Diagnosis present

## 2024-03-22 DIAGNOSIS — G8929 Other chronic pain: Secondary | ICD-10-CM | POA: Diagnosis present

## 2024-03-22 DIAGNOSIS — R1032 Left lower quadrant pain: Secondary | ICD-10-CM | POA: Diagnosis present

## 2024-03-22 DIAGNOSIS — I4892 Unspecified atrial flutter: Secondary | ICD-10-CM | POA: Diagnosis present

## 2024-03-22 DIAGNOSIS — K5900 Constipation, unspecified: Secondary | ICD-10-CM | POA: Diagnosis present

## 2024-03-22 DIAGNOSIS — Z9049 Acquired absence of other specified parts of digestive tract: Secondary | ICD-10-CM

## 2024-03-22 DIAGNOSIS — Z881 Allergy status to other antibiotic agents status: Secondary | ICD-10-CM

## 2024-03-22 DIAGNOSIS — M545 Low back pain, unspecified: Secondary | ICD-10-CM | POA: Diagnosis present

## 2024-03-22 DIAGNOSIS — K219 Gastro-esophageal reflux disease without esophagitis: Secondary | ICD-10-CM | POA: Diagnosis present

## 2024-03-22 DIAGNOSIS — G609 Hereditary and idiopathic neuropathy, unspecified: Secondary | ICD-10-CM | POA: Diagnosis present

## 2024-03-22 DIAGNOSIS — E785 Hyperlipidemia, unspecified: Secondary | ICD-10-CM | POA: Diagnosis present

## 2024-03-22 DIAGNOSIS — K5730 Diverticulosis of large intestine without perforation or abscess without bleeding: Secondary | ICD-10-CM | POA: Diagnosis present

## 2024-03-22 DIAGNOSIS — G629 Polyneuropathy, unspecified: Secondary | ICD-10-CM

## 2024-03-22 DIAGNOSIS — K8050 Calculus of bile duct without cholangitis or cholecystitis without obstruction: Secondary | ICD-10-CM | POA: Insufficient documentation

## 2024-03-22 DIAGNOSIS — K6289 Other specified diseases of anus and rectum: Secondary | ICD-10-CM | POA: Diagnosis present

## 2024-03-22 DIAGNOSIS — Z90710 Acquired absence of both cervix and uterus: Secondary | ICD-10-CM

## 2024-03-22 DIAGNOSIS — R5381 Other malaise: Secondary | ICD-10-CM

## 2024-03-22 DIAGNOSIS — Z8719 Personal history of other diseases of the digestive system: Secondary | ICD-10-CM

## 2024-03-22 DIAGNOSIS — Z86711 Personal history of pulmonary embolism: Secondary | ICD-10-CM

## 2024-03-22 DIAGNOSIS — Z79899 Other long term (current) drug therapy: Secondary | ICD-10-CM

## 2024-03-22 DIAGNOSIS — K5289 Other specified noninfective gastroenteritis and colitis: Secondary | ICD-10-CM | POA: Diagnosis not present

## 2024-03-22 DIAGNOSIS — F0390 Unspecified dementia without behavioral disturbance: Secondary | ICD-10-CM | POA: Diagnosis present

## 2024-03-22 DIAGNOSIS — Z87891 Personal history of nicotine dependence: Secondary | ICD-10-CM

## 2024-03-22 DIAGNOSIS — Z888 Allergy status to other drugs, medicaments and biological substances status: Secondary | ICD-10-CM

## 2024-03-22 DIAGNOSIS — I4891 Unspecified atrial fibrillation: Secondary | ICD-10-CM | POA: Diagnosis present

## 2024-03-22 LAB — CBC WITH DIFFERENTIAL/PLATELET
Abs Immature Granulocytes: 0.02 K/uL (ref 0.00–0.07)
Basophils Absolute: 0.1 K/uL (ref 0.0–0.1)
Basophils Relative: 1 %
Eosinophils Absolute: 0.4 K/uL (ref 0.0–0.5)
Eosinophils Relative: 4 %
HCT: 37.7 % (ref 36.0–46.0)
Hemoglobin: 12.7 g/dL (ref 12.0–15.0)
Immature Granulocytes: 0 %
Lymphocytes Relative: 12 %
Lymphs Abs: 1.2 K/uL (ref 0.7–4.0)
MCH: 31 pg (ref 26.0–34.0)
MCHC: 33.7 g/dL (ref 30.0–36.0)
MCV: 92 fL (ref 80.0–100.0)
Monocytes Absolute: 0.8 K/uL (ref 0.1–1.0)
Monocytes Relative: 9 %
Neutro Abs: 7.2 K/uL (ref 1.7–7.7)
Neutrophils Relative %: 74 %
Platelets: 293 K/uL (ref 150–400)
RBC: 4.1 MIL/uL (ref 3.87–5.11)
RDW: 12.1 % (ref 11.5–15.5)
WBC: 9.7 K/uL (ref 4.0–10.5)
nRBC: 0 % (ref 0.0–0.2)

## 2024-03-22 LAB — COMPREHENSIVE METABOLIC PANEL WITH GFR
ALT: 11 U/L (ref 0–44)
AST: 29 U/L (ref 15–41)
Albumin: 3.6 g/dL (ref 3.5–5.0)
Alkaline Phosphatase: 71 U/L (ref 38–126)
Anion gap: 11 (ref 5–15)
BUN: 21 mg/dL (ref 8–23)
CO2: 24 mmol/L (ref 22–32)
Calcium: 9.3 mg/dL (ref 8.9–10.3)
Chloride: 102 mmol/L (ref 98–111)
Creatinine, Ser: 0.76 mg/dL (ref 0.44–1.00)
GFR, Estimated: >60 mL/min (ref >60)
Glucose, Bld: 109 mg/dL — ABNORMAL HIGH (ref 70–99)
Potassium: 4.2 mmol/L (ref 3.5–5.1)
Sodium: 136 mmol/L (ref 135–145)
Total Bilirubin: 0.8 mg/dL (ref 0.0–1.2)
Total Protein: 6.6 g/dL (ref 6.5–8.1)

## 2024-03-22 LAB — URINALYSIS, ROUTINE W REFLEX MICROSCOPIC
Bilirubin Urine: NEGATIVE
Glucose, UA: NEGATIVE mg/dL
Hgb urine dipstick: NEGATIVE
Ketones, ur: 5 mg/dL — AB
Leukocytes,Ua: NEGATIVE
Nitrite: NEGATIVE
Protein, ur: NEGATIVE mg/dL
Specific Gravity, Urine: 1.021 (ref 1.005–1.030)
pH: 5 (ref 5.0–8.0)

## 2024-03-22 MED ORDER — ACETAMINOPHEN 325 MG PO TABS
650.0000 mg | ORAL_TABLET | Freq: Four times a day (QID) | ORAL | Status: DC | PRN
  Administered 2024-03-24: 650 mg via ORAL
  Filled 2024-03-22: qty 2

## 2024-03-22 MED ORDER — SIMVASTATIN 10 MG PO TABS
10.0000 mg | ORAL_TABLET | Freq: Every day | ORAL | Status: DC
  Administered 2024-03-23 – 2024-03-25 (×3): 10 mg via ORAL
  Filled 2024-03-22 (×3): qty 1

## 2024-03-22 MED ORDER — ENOXAPARIN SODIUM 30 MG/0.3ML IJ SOSY
30.0000 mg | PREFILLED_SYRINGE | INTRAMUSCULAR | Status: DC
  Administered 2024-03-22 – 2024-03-24 (×3): 30 mg via SUBCUTANEOUS
  Filled 2024-03-22 (×3): qty 0.3

## 2024-03-22 MED ORDER — IOHEXOL 300 MG/ML  SOLN
80.0000 mL | Freq: Once | INTRAMUSCULAR | Status: AC | PRN
  Administered 2024-03-22: 80 mL via INTRAVENOUS

## 2024-03-22 MED ORDER — LACTATED RINGERS IV SOLN: INTRAVENOUS | Status: AC

## 2024-03-22 MED ORDER — MORPHINE SULFATE (PF) 2 MG/ML IV SOLN
2.0000 mg | INTRAVENOUS | Status: DC | PRN
  Administered 2024-03-22 – 2024-03-23 (×2): 2 mg via INTRAVENOUS
  Filled 2024-03-22 (×2): qty 1

## 2024-03-22 MED ORDER — ONDANSETRON HCL 4 MG/2ML IJ SOLN: 4.0000 mg | Freq: Four times a day (QID) | INTRAMUSCULAR | Status: DC | PRN

## 2024-03-22 MED ORDER — POLYETHYLENE GLYCOL 3350 17 G PO PACK
8.5000 g | PACK | Freq: Two times a day (BID) | ORAL | Status: DC
  Administered 2024-03-22: 8.5 g via ORAL
  Filled 2024-03-22: qty 1

## 2024-03-22 MED ORDER — MINERAL OIL RE ENEM
1.0000 | ENEMA | Freq: Once | RECTAL | Status: AC
  Administered 2024-03-22: 1 via RECTAL
  Filled 2024-03-22: qty 1

## 2024-03-22 MED ORDER — ACETAMINOPHEN 325 MG PO TABS
650.0000 mg | ORAL_TABLET | Freq: Once | ORAL | Status: AC
  Administered 2024-03-22: 650 mg via ORAL
  Filled 2024-03-22: qty 2

## 2024-03-22 MED ORDER — LIDOCAINE HCL URETHRAL/MUCOSAL 2 % EX GEL
1.0000 | Freq: Once | CUTANEOUS | Status: AC
  Administered 2024-03-22: 1 via TOPICAL
  Filled 2024-03-22: qty 11

## 2024-03-22 MED ORDER — ACETAMINOPHEN 650 MG RE SUPP: 650.0000 mg | Freq: Four times a day (QID) | RECTAL | Status: DC | PRN

## 2024-03-22 MED ORDER — GABAPENTIN 300 MG PO CAPS: 300.0000 mg | ORAL_CAPSULE | Freq: Every day | ORAL | Status: DC | PRN

## 2024-03-22 MED ORDER — LACTATED RINGERS IV BOLUS
500.0000 mL | Freq: Once | INTRAVENOUS | Status: AC
  Administered 2024-03-22: 500 mL via INTRAVENOUS

## 2024-03-22 MED ORDER — MORPHINE SULFATE (PF) 2 MG/ML IV SOLN
2.0000 mg | Freq: Once | INTRAVENOUS | Status: AC | PRN
  Administered 2024-03-22: 2 mg via INTRAVENOUS
  Filled 2024-03-22: qty 1

## 2024-03-22 MED ORDER — SENNA 8.6 MG PO TABS
2.0000 | ORAL_TABLET | Freq: Every day | ORAL | Status: DC
  Administered 2024-03-22 – 2024-03-24 (×3): 17.2 mg via ORAL
  Filled 2024-03-22 (×3): qty 2

## 2024-03-22 MED ORDER — ONDANSETRON HCL 4 MG PO TABS: 4.0000 mg | ORAL_TABLET | Freq: Four times a day (QID) | ORAL | Status: DC | PRN

## 2024-03-22 NOTE — ED Notes (Signed)
 US  PIV placed upper R arm, 20g 2.5

## 2024-03-22 NOTE — ED Notes (Signed)
 ED TO INPATIENT HANDOFF REPORT  Name/Age/Gender Susan Ford 89 y.o. female  Code Status Code Status History     Date Active Date Inactive Code Status Order ID Comments User Context   07/06/2023 1616 07/08/2023 1819 Full Code [id number]  Zella Katha HERO, MD ED   04/12/2023 0123 04/15/2023 1638 Full Code [id number]  Cleatus Delayne GAILS, MD ED   12/28/2022 1714 01/02/2023 1909 Full Code [id number]  Charlton Evalene RAMAN, MD ED   07/08/2022 1457 07/09/2022 1946 Full Code [id number]  Zella Katha HERO, MD ED   02/21/2014 0507 02/27/2014 1540 Full Code [id number]  Vanderbilt Ned, MD ED    Questions for Most Recent Historical Code Status (Order [id number])     Question Answer   By: Consent: discussion documented in EHR                Advance Directive Documentation    Flowsheet Row Most Recent Value  Type of Advance Directive Living will  Pre-existing out of facility DNR order (yellow form or pink MOST form) --  MOST Form in Place? 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]  Level of Care/Admitting Diagnosis ED Disposition     ED Disposition  Admit   Condition  --   Comment  Hospital Area: [facility] [[id number]]  Level of Care: Med-Surg [16]  May place patient in observation at [facility] or [facility] if equivalent level of care is available:: No  Diagnosis: Stercoral colitis [8148702]  Admitting Physician: CELINDA ALM LOT [[id number]]  Attending Physician: CELINDA ALM LOT [[id number]]          Medical History Past Medical History:  Diagnosis Date   Arthritis    Cataract 2007   bilateral cat ext    Cholecystitis    Complication of anesthesia Feb 21, 2014   atrial fib after cholecystectomy, lasted a few hours, none since   Dysrhythmia 02-21-2014   after cholecystectomy had atrial fib for a few hours, none since   GERD (gastroesophageal reflux disease)    Had endoscopy no history of Barrett's   Hx of  nonmelanoma skin cancer    Unspecified hereditary and idiopathic peripheral neuropathy     Allergies Allergies  Allergen Reactions   Keflex  [Cephalexin ]     Dizziness   Lyrica [Pregabalin] Other (See Comments)    High Fever    Oxytetracycline Other (See Comments)    High Fever caused by terramycin    Memantine  Rash    IV Location/Drains/Wounds Patient Lines/Drains/Airways Status     Active Line/Drains/Airways     Name Placement date Placement time Site Days   Peripheral IV 03/22/24 20 G 2.5 Anterior;Right;Upper Arm 03/22/24  1214  Arm  less than 1            Labs/Imaging Results for orders placed or performed during the hospital encounter of 03/22/24 (from the past 48 hours)  Comprehensive metabolic panel     Status: Abnormal   Collection Time: 03/22/24 12:21 PM  Result Value Ref Range   Sodium 136 135 - 145 mmol/L   Potassium 4.2 3.5 - 5.1 mmol/L   Chloride 102 98 - 111 mmol/L   CO2 24 22 - 32 mmol/L   Glucose, Bld 109 (H) 70 - 99 mg/dL    Comment: Glucose reference range applies only to samples taken after fasting for at least 8 hours.   BUN 21 8 - 23 mg/dL   Creatinine, Ser 9.23 0.44 -  1.00 mg/dL   Calcium 9.3 8.9 - 89.6 mg/dL   Total Protein 6.6 6.5 - 8.1 g/dL   Albumin 3.6 3.5 - 5.0 g/dL   AST 29 15 - 41 U/L   ALT 11 0 - 44 U/L   Alkaline Phosphatase 71 38 - 126 U/L   Total Bilirubin 0.8 0.0 - 1.2 mg/dL   GFR, Estimated >39 >39 mL/min    Comment: (NOTE) Calculated using the CKD-EPI Creatinine Equation (2021)    Anion gap 11 5 - 15    Comment: Performed at [facility], [address] [place], [place] [address]  CBC with Differential     Status: None   Collection Time: 03/22/24 12:21 PM  Result Value Ref Range   WBC 9.7 4.0 - 10.5 K/uL   RBC 4.10 3.87 - 5.11 MIL/uL   Hemoglobin 12.7 12.0 - 15.0 g/dL   HCT 62.2 63.9 - 53.9 %   MCV 92.0 80.0 - 100.0 fL   MCH 31.0 26.0 - 34.0 pg   MCHC 33.7 30.0 - 36.0 g/dL   RDW 87.8 88.4  - 84.4 %   Platelets 293 150 - 400 K/uL   nRBC 0.0 0.0 - 0.2 %   Neutrophils Relative % 74 %   Neutro Abs 7.2 1.7 - 7.7 K/uL   Lymphocytes Relative 12 %   Lymphs Abs 1.2 0.7 - 4.0 K/uL   Monocytes Relative 9 %   Monocytes Absolute 0.8 0.1 - 1.0 K/uL   Eosinophils Relative 4 %   Eosinophils Absolute 0.4 0.0 - 0.5 K/uL   Basophils Relative 1 %   Basophils Absolute 0.1 0.0 - 0.1 K/uL   Immature Granulocytes 0 %   Abs Immature Granulocytes 0.02 0.00 - 0.07 K/uL    Comment: Performed at [facility], [address] [place], [place] [address]  Urinalysis, Routine w reflex microscopic -Urine, Catheterized     Status: Abnormal   Collection Time: 03/22/24  1:12 PM  Result Value Ref Range   Color, Urine YELLOW YELLOW   APPearance HAZY (A) CLEAR   Specific Gravity, Urine 1.021 1.005 - 1.030   pH 5.0 5.0 - 8.0   Glucose, UA NEGATIVE NEGATIVE mg/dL   Hgb urine dipstick NEGATIVE NEGATIVE   Bilirubin Urine NEGATIVE NEGATIVE   Ketones, ur 5 (A) NEGATIVE mg/dL   Protein, ur NEGATIVE NEGATIVE mg/dL   Nitrite NEGATIVE NEGATIVE   Leukocytes,Ua NEGATIVE NEGATIVE    Comment: Performed at [facility], [address], [place], [place] [address]   CT ABDOMEN PELVIS W CONTRAST Result Date: 03/22/2024 CLINICAL DATA:  Clinical concern for bowel obstruction. EXAM: CT ABDOMEN AND PELVIS WITH CONTRAST TECHNIQUE: Multidetector CT imaging of the abdomen and pelvis was performed using the standard protocol following bolus administration of intravenous contrast. RADIATION DOSE REDUCTION: This exam was performed according to the departmental dose-optimization program which includes automated exposure control, adjustment of the mA and/or kV according to patient size and/or use of iterative reconstruction technique. CONTRAST:  80mL OMNIPAQUE  IOHEXOL  300 MG/ML  SOLN COMPARISON:  Unenhanced abdomen and pelvis CT, 03/20/2024. FINDINGS: Lower chest: Patchy ground-glass and reticular  opacities noted at the lung bases, similar to the recent prior CT, consistent with chronic interstitial lung disease. Hepatobiliary: Unremarkable liver. Status post cholecystectomy. Mild chronic intrahepatic bile duct dilation. Pancreas: Unremarkable. No pancreatic ductal dilatation or surrounding inflammatory changes. Spleen: Normal in size without focal abnormality. Adrenals/Urinary Tract: Normal adrenal glands. Mild renal cortical thinning. Symmetric renal enhancement and  excretion. No renal masses or stones. No hydronephrosis. Normal ureters. Bladder mostly decompressed, otherwise unremarkable. Stomach/Bowel: Rectum is significantly distended with stool. Mild perirectal fat haziness consistent with inflammation. Stomach mostly decompressed, otherwise unremarkable. Small bowel and colon are normal in caliber. No wall thickening. No inflammation. Multiple colonic diverticula mostly on the left without evidence of diverticulitis. Vascular/Lymphatic: Stable aortic atherosclerotic calcifications. No aneurysm. No enlarged lymph nodes. Reproductive: Status post hysterectomy. No adnexal masses. Other: No abdominal wall hernia or abnormality. No abdominopelvic ascites. Musculoskeletal: No acute fracture. Mild chronic compression fracture of T11, stable from the prior CT. No bone lesion. Degenerative changes noted throughout the visualized spine. Skeletal structures are demineralized. IMPRESSION: 1. Significant rectal distention with stool with mild adjacent perirectal fat inflammation. Findings consistent with proctitis in the proper clinical setting. 2. No other acute bowel abnormality. No evidence of bowel obstruction. 3. Colonic diverticula without diverticulitis. 4. Aortic atherosclerosis. Electronically Signed   By: Alm Parkins M.D.   On: 03/22/2024 14:21   DG Hip Unilat W or Wo Pelvis 2-3 Views Left Result Date: 03/22/2024 CLINICAL DATA:  Left hip pain.  No injury. EXAM: DG HIP (WITH OR WITHOUT PELVIS) 2-3V  LEFT COMPARISON:  None Available. FINDINGS: No fracture or bone lesion. Hip joints are normally spaced and aligned. No significant arthropathic change. SI joints and pubic symphysis are also normally aligned. There are disc degenerative changes of the visualized lower lumbar spine. Skeletal structures are demineralized. Aortoiliac atherosclerotic calcifications. Soft tissues otherwise unremarkable. IMPRESSION: 1. No fracture or acute finding. No significant hip joint abnormality. Electronically Signed   By: Alm Parkins M.D.   On: 03/22/2024 13:48    Pending Labs Unresulted Labs (From admission, onward)    None       Vitals/Pain Today's Vitals   03/22/24 1139 03/22/24 1215 03/22/24 1218 03/22/24 1248  BP:  (!) 152/78    Pulse:  73    Resp:  18    Temp:      SpO2:  98%    Weight:      Height:      PainSc: 5  7  7  4      Isolation Precautions No active isolations  Medications Medications  lactated ringers  infusion (has no administration in time range)  acetaminophen  (TYLENOL ) tablet 650 mg (650 mg Oral Given 03/22/24 1139)  lidocaine  (XYLOCAINE ) 2 % jelly 1 Application (1 Application Topical Given by Other 03/22/24 1141)  lactated ringers  bolus 500 mL (0 mLs Intravenous Stopped 03/22/24 1248)  mineral oil enema 1 enema (1 enema Rectal Given 03/22/24 1244)  morphine  (PF) 2 MG/ML injection 2 mg (2 mg Intravenous Given 03/22/24 1218)  iohexol  (OMNIPAQUE ) 300 MG/ML solution 80 mL (80 mLs Intravenous Contrast Given 03/22/24 1337)    Mobility walks with person assist

## 2024-03-22 NOTE — Plan of Care (Signed)

## 2024-03-22 NOTE — ED Triage Notes (Signed)
 Pt BIB EMS from [facility] c/o ongoing lft hip pain for past 4 weeks. No recent falls. Pt is on eliquis . EMS states nursing home could non give a proper report.    EMS vitals  BP 138/82 HR 86 SPO2 98 RA

## 2024-03-22 NOTE — ED Provider Notes (Addendum)
 [place] [facility]PARTMENT AT [facility] Provider Note   CSN: [id number] Arrival date & time: 03/22/24  1050     Patient presents with: No chief complaint on file.   Susan Ford is a 89 y.o. female.   HPI 89 year old female presents with left hip pain and rectal pain.  The patient states that she has been having rectal pain for the past several days.  Feels like she needs to have a bowel movement but has not had 1 in about 24+ hours.  She states that whenever she takes the medicine she will be able to go but right now feels like she has pain and discomfort and needs to have a BM but cannot.  She denies any acute urinary symptoms though was recently here for urinary incontinence.  She is also having atraumatic left hip pain for several weeks and has chronic low back pain that is unchanged from baseline. No recent falls.  No fevers, weakness or numbness in her legs, or abdominal pain.  She states that she has been ambulating normally with a walker.  Prior to Admission medications   Medication Sig Start Date End Date Taking? Authorizing Provider  acetaminophen  (TYLENOL ) 325 MG tablet Take 650 mg by mouth every 6 (six) hours as needed for moderate pain (pain score 4-6) or mild pain (pain score 1-3).    [provider]  Apoaequorin (PREVAGEN) 10 MG CAPS Take 10 mg by mouth daily.    [provider]  Ascorbic Acid  (VITAMIN C ) 1000 MG tablet Take 1,000 mg by mouth daily.    [provider]  doxycycline  (VIBRAMYCIN ) 100 MG capsule Take 1 capsule (100 mg total) by mouth 2 (two) times daily. One po bid x 7 days 02/01/24   Emil Share, DO  gabapentin  (NEURONTIN ) 300 MG capsule TAKE 1 CAPSULE BY MOUTH EVERYDAY AT BEDTIME Patient taking differently: Take 300 mg by mouth as needed (Pain). 11/14/23   Panosh, Wanda K, MD  Multiple Vitamin (MULTIVITAMIN) tablet Take 1 tablet by mouth daily after lunch.    [provider]  senna (SENOKOT) 8.6 MG TABS  tablet Take 1 tablet (8.6 mg total) by mouth daily. Patient taking differently: Take 1 tablet by mouth daily as needed for mild constipation. 01/02/23   Christobal Guadalajara, MD  simvastatin  (ZOCOR ) 10 MG tablet TAKE 1 TABLET BY MOUTH EVERY DAY 02/15/24   Panosh, Wanda K, MD  Vibegron  75 MG TABS Take 1 tablet (75 mg total) by mouth daily. 03/04/24 05/03/24  Myriam Dorn BROCKS, PA    Allergies: Keflex  [cephalexin ], Lyrica [pregabalin], Oxytetracycline, and Memantine     Review of Systems  Constitutional:  Negative for fever.  Cardiovascular:  Negative for chest pain.  Gastrointestinal:  Positive for rectal pain. Negative for abdominal pain.  Genitourinary:  Negative for dysuria.  Musculoskeletal:  Positive for arthralgias and back pain.    Updated Vital Signs BP (!) 152/78 (BP Location: Left Arm)   Pulse 73   Temp 97.8 F (36.6 C)   Resp 18   Ht 5' 4 (1.626 m)   Wt 51.9 kg   SpO2 98%   BMI 19.64 kg/m   Physical Exam Vitals and nursing note reviewed. Exam conducted with a chaperone present.  Constitutional:      Appearance: She is well-developed.  HENT:     Head: Normocephalic and atraumatic.     Mouth/Throat:     Mouth: Mucous membranes are dry.  Cardiovascular:     Rate and Rhythm:  Normal rate and regular rhythm.     Pulses:          Dorsalis pedis pulses are 2+ on the right side and 2+ on the left side.     Heart sounds: Normal heart sounds.  Pulmonary:     Effort: Pulmonary effort is normal.     Breath sounds: Normal breath sounds.  Abdominal:     Palpations: Abdomen is soft.     Tenderness: There is no abdominal tenderness.  Genitourinary:    Comments: Small non-thrombosed hemorrhoids. Sizable amount of stool in rectal vault. No gross blood. Musculoskeletal:     Left hip: No tenderness. Normal range of motion.  Skin:    General: Skin is warm and dry.  Neurological:     Mental Status: She is alert.     Comments: 5/5 strength in BLE. Grossly normal sensation     (all  labs ordered are listed, but only abnormal results are displayed) Labs Reviewed  COMPREHENSIVE METABOLIC PANEL WITH GFR - Abnormal; Notable for the following components:      Result Value   Glucose, Bld 109 (*)    All other components within normal limits  URINALYSIS, ROUTINE W REFLEX MICROSCOPIC - Abnormal; Notable for the following components:   APPearance HAZY (*)    Ketones, ur 5 (*)    All other components within normal limits  CBC WITH DIFFERENTIAL/PLATELET    EKG: None  Radiology: CT ABDOMEN PELVIS W CONTRAST Result Date: 03/22/2024 CLINICAL DATA:  Clinical concern for bowel obstruction. EXAM: CT ABDOMEN AND PELVIS WITH CONTRAST TECHNIQUE: Multidetector CT imaging of the abdomen and pelvis was performed using the standard protocol following bolus administration of intravenous contrast. RADIATION DOSE REDUCTION: This exam was performed according to the departmental dose-optimization program which includes automated exposure control, adjustment of the mA and/or kV according to patient size and/or use of iterative reconstruction technique. CONTRAST:  80mL OMNIPAQUE  IOHEXOL  300 MG/ML  SOLN COMPARISON:  Unenhanced abdomen and pelvis CT, 03/20/2024. FINDINGS: Lower chest: Patchy ground-glass and reticular opacities noted at the lung bases, similar to the recent prior CT, consistent with chronic interstitial lung disease. Hepatobiliary: Unremarkable liver. Status post cholecystectomy. Mild chronic intrahepatic bile duct dilation. Pancreas: Unremarkable. No pancreatic ductal dilatation or surrounding inflammatory changes. Spleen: Normal in size without focal abnormality. Adrenals/Urinary Tract: Normal adrenal glands. Mild renal cortical thinning. Symmetric renal enhancement and excretion. No renal masses or stones. No hydronephrosis. Normal ureters. Bladder mostly decompressed, otherwise unremarkable. Stomach/Bowel: Rectum is significantly distended with stool. Mild perirectal fat haziness  consistent with inflammation. Stomach mostly decompressed, otherwise unremarkable. Small bowel and colon are normal in caliber. No wall thickening. No inflammation. Multiple colonic diverticula mostly on the left without evidence of diverticulitis. Vascular/Lymphatic: Stable aortic atherosclerotic calcifications. No aneurysm. No enlarged lymph nodes. Reproductive: Status post hysterectomy. No adnexal masses. Other: No abdominal wall hernia or abnormality. No abdominopelvic ascites. Musculoskeletal: No acute fracture. Mild chronic compression fracture of T11, stable from the prior CT. No bone lesion. Degenerative changes noted throughout the visualized spine. Skeletal structures are demineralized. IMPRESSION: 1. Significant rectal distention with stool with mild adjacent perirectal fat inflammation. Findings consistent with proctitis in the proper clinical setting. 2. No other acute bowel abnormality. No evidence of bowel obstruction. 3. Colonic diverticula without diverticulitis. 4. Aortic atherosclerosis. Electronically Signed   By: Alm Parkins M.D.   On: 03/22/2024 14:21   DG Hip Unilat W or Wo Pelvis 2-3 Views Left Result Date: 03/22/2024 CLINICAL DATA:  Left hip  pain.  No injury. EXAM: DG HIP (WITH OR WITHOUT PELVIS) 2-3V LEFT COMPARISON:  None Available. FINDINGS: No fracture or bone lesion. Hip joints are normally spaced and aligned. No significant arthropathic change. SI joints and pubic symphysis are also normally aligned. There are disc degenerative changes of the visualized lower lumbar spine. Skeletal structures are demineralized. Aortoiliac atherosclerotic calcifications. Soft tissues otherwise unremarkable. IMPRESSION: 1. No fracture or acute finding. No significant hip joint abnormality. Electronically Signed   By: Alm Parkins M.D.   On: 03/22/2024 13:48     .Fecal disimpaction  Date/Time: 03/22/2024 11:52 AM  Performed by: Freddi Hamilton, MD Authorized by: Freddi Hamilton, MD   Consent: Verbal consent obtained Local anesthesia used: yes  Anesthesia: Local anesthesia used: yes Local Anesthetic: topical anesthetic  Sedation: Patient sedated: no  Patient tolerance: patient tolerated the procedure well with no immediate complications Comments: Only a small amount of stool able to be disimpacted, had to stop the procedure due to patient discomfort.      Medications Ordered in the ED  lactated ringers  infusion (has no administration in time range)  acetaminophen  (TYLENOL ) tablet 650 mg (650 mg Oral Given 03/22/24 1139)  lidocaine  (XYLOCAINE ) 2 % jelly 1 Application (1 Application Topical Given by Other 03/22/24 1141)  lactated ringers  bolus 500 mL (0 mLs Intravenous Stopped 03/22/24 1248)  mineral oil enema 1 enema (1 enema Rectal Given 03/22/24 1244)  morphine  (PF) 2 MG/ML injection 2 mg (2 mg Intravenous Given 03/22/24 1218)  iohexol  (OMNIPAQUE ) 300 MG/ML solution 80 mL (80 mLs Intravenous Contrast Given 03/22/24 1337)                                    Medical Decision Making Amount and/or Complexity of Data Reviewed Independent Historian: EMS External Data Reviewed: notes. Labs: ordered.    Details: Normal WBC Radiology: ordered and independent interpretation performed.    Details: Large stool ball with some mild inflammation  Risk OTC drugs. Prescription drug management. Decision regarding hospitalization.   Patient presents with mostly rectal pain as well as some hip pain.  Unable to successfully disimpact besides a small amount due to patient discomfort.  Tried an enema but again, minimal relief, however now she is stating the rectal pain seems to be gone.  No abdominal pain, but given minimal output a CT was obtained.  This shows a significant amount of stool in the rectum as well as some inflammation concerning for proctitis, in this situation I am worried this is stercoral colitis.  I think she will need further bowel care.  Will start her on  some fluids.  Due to this she will need admission.  Discussed with Dr. Celinda for admission.  She was able to ambulate in the room and bear weight on her own, highly doubt occult hip fracture, unclear what is causing her atraumatic hip pain but it seems like it has been there from a subacute to chronic time period.     Final diagnoses:  Stercoral colitis    ED Discharge Orders     None          Freddi Hamilton, MD 03/22/24 1449    Freddi Hamilton, MD 03/22/24 1521

## 2024-03-22 NOTE — H&P (Signed)
 History and Physical    Patient: Susan Ford FMW:[id number] DOB: Aug 13, 1929 DOA: 03/22/2024 DOS: the patient was seen and examined on 03/22/2024 PCP: Charlett [name], MD  Patient coming from: SNF  Chief Complaint: Left hip pain.  HPI: Susan Ford is a 89 y.o. female with medical history significant of osteoarthritis, hyperlipidemia, bilateral cataracts, choledocholithiasis, cholecystitis, history of ERCP, cholecystectomy, single episode transient postcholecystectomy A-fib, history of a flutter, history of anticoagulation, history of GI bleed, GERD, hereditary and idiopathic peripheral neuropathy, history of pulmonary embolism who was sent in via EMS from her nursing facility due to left hip pain, but also was complaining of constipation. He denied fever, chills, rhinorrhea, sore throat, wheezing or hemoptysis.  No chest pain, palpitations, diaphoresis, PND, orthopnea or pitting edema of the lower extremities.  No abdominal pain, nausea, emesis, diarrhea, constipation, melena or hematochezia.  No flank pain, dysuria, frequency or hematuria.  No polyuria, polydipsia, polyphagia or blurred vision.   Lab work:Urinalysis was hazy with ketones of 5 mg/dL, but otherwise negative. CBC was normal. CMP was unremarkable with the exception of a glucose of 109 mg/dL.  Imaging: Left hip x-ray with no fracture or acute finding.  No significant hip joint abnormality.  CT abdomen/pelvis with contrast showing significant rectal distention with stool with mild adjacent perirectal fat inflammation.  Findings consistent with proctitis in the proper clinical setting.  No other acute bowel abnormality.  No evidence of bowel obstruction.  Colonic diverticula without diverticulitis.  Aortic atherosclerosis.  ED course: Initial vital signs were temperature 97.8 F, pulse 99, respirations 16, BP 153/90 mmHg and O2 sat 100% on room air.  The patient received LR 500 mL bolus, morphine  2 mg IVP x 1 and the mineral  oil enema applied with lidocaine  2% jelly.   Review of Systems: As mentioned in the history of present illness. All other systems reviewed and are negative. Past Medical History:  Diagnosis Date   Arthritis    Cataract 2007   bilateral cat ext    Cholecystitis    Complication of anesthesia Feb 21, 2014   atrial fib after cholecystectomy, lasted a few hours, none since   Dysrhythmia 02-21-2014   after cholecystectomy had atrial fib for a few hours, none since   GERD (gastroesophageal reflux disease)    Had endoscopy no history of Barrett's   Hx of nonmelanoma skin cancer    Unspecified hereditary and idiopathic peripheral neuropathy    Past Surgical History:  Procedure Laterality Date   ABDOMINAL HYSTERECTOMY  15 yrs ago   complete   BIOPSY  07/09/2022   Procedure: BIOPSY;  Surgeon: Elicia Claw, MD;  Location: [facility];  Service: Gastroenterology;;   BIOPSY  04/13/2023   Procedure: BIOPSY;  Surgeon: Rosalie Kitchens, MD;  Location: [facility];  Service: Gastroenterology;;   BREAST CYST ASPIRATION     CHOLECYSTECTOMY N/A 02/21/2014   Procedure: LAPAROSCOPIC CHOLECYSTECTOMY WITH INTRAOPERATIVE CHOLANGIOGRAM;  Surgeon: Gordy Pina, MD;  Location: [facility] OR;  Service: General;  Laterality: N/A;   ENDOSCOPIC RETROGRADE CHOLANGIOPANCREATOGRAPHY (ERCP) WITH PROPOFOL  N/A 05/29/2014   Procedure: ENDOSCOPIC RETROGRADE CHOLANGIOPANCREATOGRAPHY (ERCP) WITH PROPOFOL ;  Surgeon: Elsie Cree, MD;  Location: [facility];  Service: Endoscopy;  Laterality: N/A;   ERCP N/A 02/24/2014   Procedure: ENDOSCOPIC RETROGRADE CHOLANGIOPANCREATOGRAPHY (ERCP);  Surgeon: Elsie Cree, MD;  Location[facility] END[facility]COPY;  Service: Endoscopy;  Laterality: N/A;   ESOPHAGOGASTRODUODENOSCOPY (EGD) WITH PROPOFOL  N/A 07/09/2022   Procedure: ESOPHAGOGASTRODUODENOSCOPY (EGD) WITH PROPOFOL ;  Surgeon: Elicia Claw, MD;  Location: [facility];  Service: Gastroenterology;  Laterality: N/A;   ESOPHAGOGASTRODUODENOSCOPY (EGD)  WITH PROPOFOL  N/A 04/13/2023   Procedure: ESOPHAGOGASTRODUODENOSCOPY (EGD) WITH PROPOFOL ;  Surgeon: Rosalie Kitchens, MD;  Location: [facility] ENDOS[facility];  Service: Gastroenterology;  Laterality: N/A;   EYE SURGERY Bilateral 2007   both eyes cataracts with lens replacments   LUMBAR LAMINECTOMY/DECOMPRESSION MICRODISCECTOMY  02/01/2012   Procedure: LUMBAR LAMINECTOMY/DECOMPRESSION MICRODISCECTOMY 2 LEVELS;  Surgeon: Victory Gens, MD;  Location: [facility]URO [facility];  Service: Neurosurgery;  Laterality: Left;  Left Lumbar Four-Five Lumbar Five-Sacral One Laminectomies/Foraminotomies/Microscope   SPYGLASS CHOLANGIOSCOPY N/A 05/29/2014   Procedure: SPYGLASS CHOLANGIOSCOPY;  Surgeon: Elsie Cree, MD;  Location: [facility] END[facility];  Service: Endoscopy;  Laterality: N/A;   TONSILLECTOMY  age 9   Social History:  reports that she quit smoking about 60 years ago. Her smoking use included cigarettes. She started smoking about 67 years ago. She has a 1.8 pack-year smoking history. She has never used smokeless tobacco. She reports current alcohol  use of about 7.0 standard drinks of alcohol  per week. She reports that she does not use drugs.  Allergies  Allergen Reactions   Keflex  [Cephalexin ]     Dizziness   Lyrica [Pregabalin] Other (See Comments)    High Fever    Oxytetracycline Other (See Comments)    High Fever caused by terramycin    Memantine  Rash    Family History  Problem Relation Age of Onset   Neurodegenerative disease Mother        Shy Drager died at 70   Alcohol  abuse Other    Heart disease Other     Prior to Admission medications   Medication Sig Start Date End Date Taking? Authorizing Provider  acetaminophen  (TYLENOL ) 325 MG tablet Take 650 mg by mouth every 6 (six) hours as needed for moderate pain (pain score 4-6) or mild pain (pain score 1-3).    [provider]  Apoaequorin (PREVAGEN) 10 MG CAPS Take 10 mg by mouth daily.    [provider]  Ascorbic Acid  (VITAMIN C ) 1000 MG  tablet Take 1,000 mg by mouth daily.    [provider]  doxycycline  (VIBRAMYCIN ) 100 MG capsule Take 1 capsule (100 mg total) by mouth 2 (two) times daily. One po bid x 7 days 02/01/24   Emil Share, DO  gabapentin  (NEURONTIN ) 300 MG capsule TAKE 1 CAPSULE BY MOUTH EVERYDAY AT BEDTIME Patient taking differently: Take 300 mg by mouth as needed (Pain). 11/14/23   Panosh, Wanda K, MD  Multiple Vitamin (MULTIVITAMIN) tablet Take 1 tablet by mouth daily after lunch.    [provider]  senna (SENOKOT) 8.6 MG TABS tablet Take 1 tablet (8.6 mg total) by mouth daily. Patient taking differently: Take 1 tablet by mouth daily as needed for mild constipation. 01/02/23   Christobal Guadalajara, MD  simvastatin  (ZOCOR ) 10 MG tablet TAKE 1 TABLET BY MOUTH EVERY DAY 02/15/24   Panosh, Wanda K, MD  Vibegron  75 MG TABS Take 1 tablet (75 mg total) by mouth daily. 03/04/24 05/03/24  Myriam Dorn BROCKS, PA    Physical Exam: Vitals:   03/22/24 1100 03/22/24 1102 03/22/24 1109 03/22/24 1215  BP:  (!) 181/88  (!) 152/78  Pulse:  82  73  Resp:  16  18  Temp: 97.8 F (36.6 C)     SpO2:  99%  98%  Weight:   51.9 kg   Height:   5' 4 (1.626 m)    Physical Exam Vitals and nursing note reviewed.  Constitutional:  General: She is awake. She is not in acute distress.    Appearance: Normal appearance. She is ill-appearing.  HENT:     Head: Normocephalic.     Nose: No rhinorrhea.     Mouth/Throat:     Mouth: Mucous membranes are dry.  Eyes:     General: No scleral icterus.    Pupils: Pupils are equal, round, and reactive to light.  Neck:     Vascular: No JVD.  Cardiovascular:     Rate and Rhythm: Normal rate and regular rhythm.     Heart sounds: S1 normal and S2 normal.  Pulmonary:     Effort: Pulmonary effort is normal.     Breath sounds: Normal breath sounds. No wheezing, rhonchi or rales.  Abdominal:     General: Bowel sounds are normal. There is no distension.     Palpations: Abdomen is soft.      Tenderness: There is abdominal tenderness in the left lower quadrant. There is no right CVA tenderness, left CVA tenderness, guarding or rebound.  Musculoskeletal:     Cervical back: Neck supple.     Right lower leg: No edema.     Left lower leg: No edema.  Skin:    General: Skin is warm and dry.  Neurological:     General: No focal deficit present.     Mental Status: She is alert and oriented to person, place, and time.  Psychiatric:        Mood and Affect: Mood normal.        Behavior: Behavior normal. Behavior is cooperative.     Data Reviewed:  Results are pending, will review when available.  06/09/2021 echocardiogram report. IMPRESSIONS:   1. Left ventricular ejection fraction, by estimation, is 60 to 65%. The  left ventricle has normal function. The left ventricle has no regional  wall motion abnormalities. There is mild left ventricular hypertrophy of  the basal-septal segment. Left  ventricular diastolic parameters are consistent with Grade I diastolic  dysfunction (impaired relaxation). The average left ventricular global  longitudinal strain is -18.5 %. The global longitudinal strain is normal.   2. Right ventricular systolic function is normal. The right ventricular  size is normal. There is normal pulmonary artery systolic pressure.   3. The mitral valve is normal in structure. Trivial mitral valve  regurgitation. No evidence of mitral stenosis.   4. The aortic valve is tricuspid. Aortic valve regurgitation is not  visualized. No aortic stenosis is present.   5. The inferior vena cava is normal in size with greater than 50%  respiratory variability, suggesting right atrial pressure of 3 mmHg.   Assessment and Plan: Principal Problem:   Left lower quadrant abdominal pain  In the setting of:   Stercoral colitis Observation/MedSurg. Continue IV fluids. Continue MiraLAX  8.5 g twice daily. Senokot 2 tablets p.o. nightly. Retrial of enema in AM if  needed. Analgesics as needed. Antiemetics as needed. Follow CBC, CMP  in AM.  Active Problems:   Neuropathy, peripheral Continue gabapentin  300 mg p.o. daily as needed.    Hyperlipidemia Continue simvastatin  10 mg p.o. daily.    Dementia without behavioral disturbance (HCC) Fully oriented at this time. Supportive care as needed.    Advance Care Planning:   Code Status: Full Code   Consults:   Family Communication:   Severity of Illness: The appropriate patient status for this patient is OBSERVATION. Observation status is judged to be reasonable and necessary in order to provide the required  intensity of service to ensure the patient's safety. The patient's presenting symptoms, physical exam findings, and initial radiographic and laboratory data in the context of their medical condition is felt to place them at decreased risk for further clinical deterioration. Furthermore, it is anticipated that the patient will be medically stable for discharge from the hospital within 2 midnights of admission.   Author: Alm Dorn Castor, MD 03/22/2024 2:35 PM  For on call review [email or web address].   This document was prepared using Dragon voice recognition software and may contain some unintended transcription errors.

## 2024-03-23 ENCOUNTER — Observation Stay (HOSPITAL_COMMUNITY)

## 2024-03-23 DIAGNOSIS — K5289 Other specified noninfective gastroenteritis and colitis: Secondary | ICD-10-CM | POA: Diagnosis not present

## 2024-03-23 DIAGNOSIS — I7090 Unspecified atherosclerosis: Secondary | ICD-10-CM | POA: Diagnosis not present

## 2024-03-23 DIAGNOSIS — I517 Cardiomegaly: Secondary | ICD-10-CM | POA: Diagnosis not present

## 2024-03-23 DIAGNOSIS — R109 Unspecified abdominal pain: Secondary | ICD-10-CM | POA: Diagnosis not present

## 2024-03-23 DIAGNOSIS — R918 Other nonspecific abnormal finding of lung field: Secondary | ICD-10-CM | POA: Diagnosis not present

## 2024-03-23 LAB — COMPREHENSIVE METABOLIC PANEL WITH GFR
ALT: 8 U/L (ref 0–44)
AST: 26 U/L (ref 15–41)
Albumin: 3.2 g/dL — ABNORMAL LOW (ref 3.5–5.0)
Alkaline Phosphatase: 64 U/L (ref 38–126)
Anion gap: 10 (ref 5–15)
BUN: 15 mg/dL (ref 8–23)
CO2: 22 mmol/L (ref 22–32)
Calcium: 8.7 mg/dL — ABNORMAL LOW (ref 8.9–10.3)
Chloride: 101 mmol/L (ref 98–111)
Creatinine, Ser: 0.66 mg/dL (ref 0.44–1.00)
GFR, Estimated: >60 mL/min (ref >60)
Glucose, Bld: 102 mg/dL — ABNORMAL HIGH (ref 70–99)
Potassium: 3.8 mmol/L (ref 3.5–5.1)
Sodium: 134 mmol/L — ABNORMAL LOW (ref 135–145)
Total Bilirubin: 0.6 mg/dL (ref 0.0–1.2)
Total Protein: 6 g/dL — ABNORMAL LOW (ref 6.5–8.1)

## 2024-03-23 LAB — CBC
HCT: 35.1 % — ABNORMAL LOW (ref 36.0–46.0)
Hemoglobin: 11.7 g/dL — ABNORMAL LOW (ref 12.0–15.0)
MCH: 30.7 pg (ref 26.0–34.0)
MCHC: 33.3 g/dL (ref 30.0–36.0)
MCV: 92.1 fL (ref 80.0–100.0)
Platelets: 259 K/uL (ref 150–400)
RBC: 3.81 MIL/uL — ABNORMAL LOW (ref 3.87–5.11)
RDW: 12.2 % (ref 11.5–15.5)
WBC: 8.6 K/uL (ref 4.0–10.5)
nRBC: 0 % (ref 0.0–0.2)

## 2024-03-23 MED ORDER — BISACODYL 5 MG PO TBEC
10.0000 mg | DELAYED_RELEASE_TABLET | Freq: Every day | ORAL | Status: DC
  Administered 2024-03-23 – 2024-03-25 (×3): 10 mg via ORAL
  Filled 2024-03-23 (×3): qty 2

## 2024-03-23 MED ORDER — QUETIAPINE FUMARATE 25 MG PO TABS
12.5000 mg | ORAL_TABLET | Freq: Every day | ORAL | Status: DC
  Administered 2024-03-23 – 2024-03-24 (×2): 12.5 mg via ORAL
  Filled 2024-03-23 (×2): qty 1

## 2024-03-23 MED ORDER — SMOG ENEMA
200.0000 mL | Freq: Once | RECTAL | Status: AC
  Administered 2024-03-23: 200 mL via RECTAL
  Filled 2024-03-23: qty 960

## 2024-03-23 MED ORDER — HALOPERIDOL LACTATE 5 MG/ML IJ SOLN: 2.0000 mg | Freq: Four times a day (QID) | INTRAMUSCULAR | Status: DC | PRN

## 2024-03-23 MED ORDER — SMOG ENEMA
400.0000 mL | Freq: Once | RECTAL | Status: DC
  Filled 2024-03-23: qty 960

## 2024-03-23 MED ORDER — POLYETHYLENE GLYCOL 3350 17 G PO PACK
17.0000 g | PACK | Freq: Two times a day (BID) | ORAL | Status: DC
  Administered 2024-03-23 – 2024-03-25 (×5): 17 g via ORAL
  Filled 2024-03-23 (×5): qty 1

## 2024-03-23 NOTE — Plan of Care (Signed)

## 2024-03-23 NOTE — Progress Notes (Signed)
 PROGRESS NOTE    Susan Ford  FMW:[id number] DOB: 11/04/1929 DOA: 03/22/2024 PCP: Charlett [name], MD    Brief Narrative:  89 y.o. female with medical history significant of osteoarthritis, hyperlipidemia, bilateral cataracts, choledocholithiasis, cholecystitis, history of ERCP, cholecystectomy, single episode transient postcholecystectomy A-fib, history of a flutter, history of anticoagulation, history of GI bleed, GERD, hereditary and idiopathic peripheral neuropathy, history of pulmonary embolism who was sent in via EMS from her nursing facility due to left hip pain, but also was complaining of constipation. He denied fever, chills, rhinorrhea, sore throat, wheezing or hemoptysis.  No chest pain, palpitations, diaphoresis, PND, orthopnea or pitting edema of the lower extremities.  No abdominal pain, nausea, emesis, diarrhea, constipation, melena or hematochezia.  No flank pain, dysuria, frequency or hematuria.  No polyuria, polydipsia, polyphagia or blurred vision.    Lab work:Urinalysis was hazy with ketones of 5 mg/dL, but otherwise negative. CBC was normal. CMP was unremarkable with the exception of a glucose of 109 mg/dL.   Imaging: Left hip x-ray with no fracture or acute finding.  No significant hip joint abnormality.  CT abdomen/pelvis with contrast showing significant rectal distention with stool with mild adjacent perirectal fat inflammation.  Findings consistent with proctitis in the proper clinical setting.  No other acute bowel abnormality.  No evidence of bowel obstruction.  Colonic diverticula without diverticulitis.  Aortic atherosclerosis.    Assessment & Plan:   Principal Problem:   Stercoral colitis Active Problems:   Neuropathy, peripheral   Hyperlipidemia   Dementia without behavioral disturbance (HCC)   Left lower quadrant abdominal pain     Left lower quadrant abdominal pain CT abdomen and pelvis shows significant rectal distention with stool with mild  adjacent perirectal fat inflammation. Findings consistent with proctitis in the proper clinical setting. No other acute bowel abnormality. No evidence of bowel obstruction. Colonic diverticula without diverticulitis. Aortic atherosclerosis. In the setting of: Stercoral colitis Increase MiraLAX  continue senna Smog enema today Continue IV fluids Avoid narcotics.   Active Problems:   Neuropathy, peripheral Continue gabapentin  300 mg p.o. daily as needed.     Hyperlipidemia Continue simvastatin  10 mg p.o. daily.     Dementia without behavioral disturbance (HCC) Fully oriented at this time. Supportive care as needed.   Estimated body mass index is 19.64 kg/m as calculated from the following:   Height as of this encounter: 5' 4 (1.626 m).   Weight as of this encounter: 51.9 kg.  DVT prophylaxis: lovenox  Code Status:full Family Communication:  Disposition Plan:  Status is: Observation  Consultants:none  Procedures:none Antimicrobials:none  Subjective: Denies pain had BMs overnight and this morning after enema  Objective: Vitals:   03/22/24 1554 03/22/24 1934 03/23/24 0006 03/23/24 0452  BP: (!) 153/66 (!) 143/80 (!) 147/107 (!) 143/85  Pulse: 74 78 (!) 106 (!) 105  Resp: 16 16  16   Temp: 98.4 F (36.9 C) 98.1 F (36.7 C) 99.2 F (37.3 C) 98.6 F (37 C)  TempSrc: Oral Oral    SpO2: 98% 97% 98% 95%  Weight:      Height:        Intake/Output Summary (Last 24 hours) at 03/23/2024 0921 Last data filed at 03/23/2024 0452 Gross per 24 hour  Intake 605.89 ml  Output 900 ml  Net -294.11 ml   Filed Weights   03/22/24 1109  Weight: 51.9 kg    Examination:  General exam: Appears in mild distress Respiratory system: Clear to auscultation. Respiratory effort normal. Cardiovascular system:reg Gastrointestinal  system: Abdomen is nondistended, soft and nontender. No organomegaly or masses felt. Normal bowel sounds heard. Central nervous system: awake Extremities:  no edema   Data Reviewed: I have personally reviewed following labs and imaging studies  CBC: Recent Labs  Lab 03/20/24 1507 03/22/24 1221 03/23/24 0412  WBC 8.6 9.7 8.6  NEUTROABS 5.8 7.2  --   HGB 12.2 12.7 11.7*  HCT 37.2 37.7 35.1*  MCV 93.9 92.0 92.1  PLT 257 293 259   Basic Metabolic Panel: Recent Labs  Lab 03/20/24 1507 03/22/24 1221 03/23/24 0412  NA 135 136 134*  K 4.4 4.2 3.8  CL 101 102 101  CO2 25 24 22   GLUCOSE 102* 109* 102*  BUN 11 21 15   CREATININE 0.67 0.76 0.66  CALCIUM 9.2 9.3 8.7*   GFR: Estimated Creatinine Clearance: 36.8 mL/min (by C-G formula based on SCr of 0.66 mg/dL). Liver Function Tests: Recent Labs  Lab 03/22/24 1221 03/23/24 0412  AST 29 26  ALT 11 8  ALKPHOS 71 64  BILITOT 0.8 0.6  PROT 6.6 6.0*  ALBUMIN 3.6 3.2*   No results for input(s): LIPASE, AMYLASE in the last 168 hours. No results for input(s): AMMONIA in the last 168 hours. Coagulation Profile: No results for input(s): INR, PROTIME in the last 168 hours. Cardiac Enzymes: No results for input(s): CKTOTAL, CKMB, CKMBINDEX, TROPONINI in the last 168 hours. BNP (last 3 results) Recent Labs    05/11/23 1527  PROBNP 61.0   HbA1C: No results for input(s): HGBA1C in the last 72 hours. CBG: No results for input(s): GLUCAP in the last 168 hours. Lipid Profile: No results for input(s): CHOL, HDL, LDLCALC, TRIG, CHOLHDL, LDLDIRECT in the last 72 hours. Thyroid  Function Tests: No results for input(s): TSH, T4TOTAL, FREET4, T3FREE, THYROIDAB in the last 72 hours. Anemia Panel: No results for input(s): VITAMINB12, FOLATE, FERRITIN, TIBC, IRON, RETICCTPCT in the last 72 hours. Sepsis Labs: No results for input(s): PROCALCITON, LATICACIDVEN in the last 168 hours.  No results found for this or any previous visit (from the past 240 hours).       Radiology Studies: CT ABDOMEN PELVIS W CONTRAST Result Date:  03/22/2024 CLINICAL DATA:  Clinical concern for bowel obstruction. EXAM: CT ABDOMEN AND PELVIS WITH CONTRAST TECHNIQUE: Multidetector CT imaging of the abdomen and pelvis was performed using the standard protocol following bolus administration of intravenous contrast. RADIATION DOSE REDUCTION: This exam was performed according to the departmental dose-optimization program which includes automated exposure control, adjustment of the mA and/or kV according to patient size and/or use of iterative reconstruction technique. CONTRAST:  80mL OMNIPAQUE  IOHEXOL  300 MG/ML  SOLN COMPARISON:  Unenhanced abdomen and pelvis CT, 03/20/2024. FINDINGS: Lower chest: Patchy ground-glass and reticular opacities noted at the lung bases, similar to the recent prior CT, consistent with chronic interstitial lung disease. Hepatobiliary: Unremarkable liver. Status post cholecystectomy. Mild chronic intrahepatic bile duct dilation. Pancreas: Unremarkable. No pancreatic ductal dilatation or surrounding inflammatory changes. Spleen: Normal in size without focal abnormality. Adrenals/Urinary Tract: Normal adrenal glands. Mild renal cortical thinning. Symmetric renal enhancement and excretion. No renal masses or stones. No hydronephrosis. Normal ureters. Bladder mostly decompressed, otherwise unremarkable. Stomach/Bowel: Rectum is significantly distended with stool. Mild perirectal fat haziness consistent with inflammation. Stomach mostly decompressed, otherwise unremarkable. Small bowel and colon are normal in caliber. No wall thickening. No inflammation. Multiple colonic diverticula mostly on the left without evidence of diverticulitis. Vascular/Lymphatic: Stable aortic atherosclerotic calcifications. No aneurysm. No enlarged lymph nodes. Reproductive: Status post hysterectomy.  No adnexal masses. Other: No abdominal wall hernia or abnormality. No abdominopelvic ascites. Musculoskeletal: No acute fracture. Mild chronic compression fracture of  T11, stable from the prior CT. No bone lesion. Degenerative changes noted throughout the visualized spine. Skeletal structures are demineralized. IMPRESSION: 1. Significant rectal distention with stool with mild adjacent perirectal fat inflammation. Findings consistent with proctitis in the proper clinical setting. 2. No other acute bowel abnormality. No evidence of bowel obstruction. 3. Colonic diverticula without diverticulitis. 4. Aortic atherosclerosis. Electronically Signed   By: Alm Parkins M.D.   On: 03/22/2024 14:21   DG Hip Unilat W or Wo Pelvis 2-3 Views Left Result Date: 03/22/2024 CLINICAL DATA:  Left hip pain.  No injury. EXAM: DG HIP (WITH OR WITHOUT PELVIS) 2-3V LEFT COMPARISON:  None Available. FINDINGS: No fracture or bone lesion. Hip joints are normally spaced and aligned. No significant arthropathic change. SI joints and pubic symphysis are also normally aligned. There are disc degenerative changes of the visualized lower lumbar spine. Skeletal structures are demineralized. Aortoiliac atherosclerotic calcifications. Soft tissues otherwise unremarkable. IMPRESSION: 1. No fracture or acute finding. No significant hip joint abnormality. Electronically Signed   By: Alm Parkins M.D.   On: 03/22/2024 13:48     Scheduled Meds:  enoxaparin  (LOVENOX ) injection  30 mg Subcutaneous Q24H   polyethylene glycol  8.5 g Oral BID   senna  2 tablet Oral QHS   simvastatin   10 mg Oral Daily   Continuous Infusions:  lactated ringers  100 mL/hr at 03/23/24 0325     LOS: 0 days    Almarie KANDICE Hoots, MD  03/23/2024, 9:21 AM

## 2024-03-23 NOTE — Plan of Care (Signed)
   Problem: Education: Goal: Knowledge of General Education information will improve Description: Including pain rating scale, medication(s)/side effects and non-pharmacologic comfort measures Outcome: Progressing   Problem: Clinical Measurements: Goal: Ability to maintain clinical measurements within normal limits will improve Outcome: Progressing

## 2024-03-23 NOTE — Care Management Obs Status (Signed)
 MEDICARE OBSERVATION STATUS NOTIFICATION   Patient Details  Name: Susan Ford MRN: [id number] Date of Birth: 1929-06-26   Medicare Observation Status Notification Given:  Yes    Doneta Glenys DASEN, RN 03/23/2024, 1:06 PM

## 2024-03-23 NOTE — Progress Notes (Signed)
 Pt refused all meds this am for constipation, yet agreeable to take them at this time to help the process along. Pt confused but willing to work with me this afternoon to help with her impaction.

## 2024-03-23 NOTE — TOC Initial Note (Addendum)
 Transition of Care [facility]) - Initial/Assessment Note    Patient Details  Name: Susan Ford MRN: [id number] Date of Birth: 09-Jul-1929  Transition of [facility]) CM/SW Contact:    Doneta Glenys DASEN, RN Phone Number: 03/23/2024, 12:56 PM  Clinical Narrative:                 MOON COMPLETED. Patient presented for left hip pain. PTA patient was from [facility]. DME (cane , rollatoe, and shower chair). CM spoke with Robert(son) [phone]. Patient and Lamar stated PTAR for transport. CM will contact [facility] to confirm transport.  Case Manager will follow progression for discharge.  Expected Discharge Plan: Assisted Living Barriers to Discharge: Continued Medical Work up   Patient Goals and CMS Choice Patient states their goals for this hospitalization and ongoing recovery are:: [facility] CMS Medicare.gov Compare Post Acute Care list provided to::  (NA) Choice offered to / list presented to : NA [place] ownership interest in [facility].provided to:: Parent NA    Expected Discharge Plan and Services In-house Referral: NA Discharge Planning Services: CM Consult Post Acute Care Choice: NA Living arrangements for the past 2 months: Independent Living Facility ([facility])                 DME Arranged: N/A DME Agency: NA       HH Arranged: NA HH Agency: NA        Prior Living Arrangements/Services Living arrangements for the past 2 months: Independent Living Facility ([facility]) Lives with:: Facility Resident Patient language and need for interpreter reviewed:: Yes Do you feel safe going back to the place where you live?: Yes      Need for Family Participation in Patient Care: Yes (Comment) Care giver support system in place?: Yes (comment) Current home services:  (cane,rollator, [place]) Criminal Activity/Legal Involvement Pertinent to Current Situation/Hospitalization: No - Comment as needed  Activities of Daily  Living   ADL Screening (condition at time of admission) Independently performs ADLs?: Yes (appropriate for developmental age) Is the patient deaf or have difficulty hearing?: Yes Does the patient have difficulty seeing, even when wearing glasses/contacts?: No Does the patient have difficulty concentrating, remembering, or making decisions?: No  Permission Sought/Granted Permission sought to share information with : Case Manager Permission granted to share information with : Yes, Verbal Permission Granted  Share Information with NAME: [name] (son) [phone]  Permission granted to share info w AGENCY: [facility]        Emotional Assessment Appearance:: Appears stated age Attitude/Demeanor/Rapport: Gracious Affect (typically observed): Appropriate Orientation: : Oriented to Self, Oriented to Place, Oriented to  Time, Oriented to Situation Alcohol  / Substance Use: Not Applicable Psych Involvement: No (comment)  Admission diagnosis:  Stercoral colitis [K52.89] Patient Active Problem List   Diagnosis Date Noted   Common bile duct stone 03/22/2024   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   Left lower quadrant abdominal pain 03/22/2024   Influenza A 07/08/2023   Physical deconditioning 07/06/2023   ABLA (acute blood loss anemia) 04/12/2023   History of bleeding peptic ulcer 04/12/2023   History of pulmonary embolism, 12/28/22 04/12/2023   Chronic anticoagulation 04/12/2023   Acute GI bleeding 04/12/2023   Acute UTI 04/12/2023   Pulmonary embolism (HCC) 12/28/2022   Diarrhea 12/28/2022   Melena, with acute blood loss anemia 07/08/2022   Dementia without behavioral disturbance (HCC) 10/03/2017   Gait abnormality 06/29/2016   Chronic bilateral low back pain without sciatica 06/29/2016   Memory loss 10/15/2015  Death of husband 04/11/2014   Bereavement due to life event 04/11/2014   Absolute anemia 04/08/2014   Atrial flutter (HCC) 02/23/2014   Acute calculous cholecystitis  02/21/2014   Spinal stenosis of lumbar region 01/04/2014   Recent bereavement 10/25/2013   Arthritis 10/25/2013   Hyperlipidemia 10/23/2013   Hereditary and idiopathic peripheral neuropathy    Nausea alone 07/14/2012   Neuropathy, peripheral 05/05/2011   High risk medication use 05/05/2011   Subcutaneous emphysema 05/05/2011   Cough 04/03/2011   Neuropathy 04/03/2011   Osteoarthritis 05/15/2010   STIFFNESS OF JOINT NEC ANKLE AND FOOT 05/15/2010   GANGLION CYST, WRIST, RIGHT 05/15/2010   VERTIGO, BENIGN PAROXYSMAL POSITION 01/31/2007   PCP:  Charlett [name], MD Pharmacy:   [facility] [phone] - [place], [place] - [address] AT [facility] [address] [place] [address] Phone: [phone] Fax: [phone]     Social Drivers of Health (SDOH) Social History: SDOH Screenings   Food Insecurity: No Food Insecurity (03/22/2024)  Housing: Low Risk  (03/22/2024)  Transportation Needs: Unmet Transportation Needs (03/22/2024)  Utilities: Not At Risk (03/22/2024)  Alcohol  Screen: Low Risk  (06/15/2023)  Depression (PHQ2-9): Low Risk  (06/15/2023)  Financial Resource Strain: Low Risk  (06/15/2023)  Physical Activity: Inactive (06/15/2023)  Social Connections: Moderately Integrated (03/22/2024)  Stress: No Stress Concern Present (06/15/2023)  Tobacco Use: Medium Risk (03/20/2024)  Health Literacy: Adequate Health Literacy (06/15/2023)   SDOH Interventions:     Readmission Risk Interventions     No data to display

## 2024-03-23 NOTE — Progress Notes (Signed)
 After repeated attempts to give SMOG enema to pt, she was finally agreeable. Administered enema with moderate results mostly firm bits of stool.

## 2024-03-23 NOTE — Progress Notes (Signed)
 Pt sleeping.

## 2024-03-24 ENCOUNTER — Inpatient Hospital Stay (HOSPITAL_COMMUNITY)

## 2024-03-24 DIAGNOSIS — I517 Cardiomegaly: Secondary | ICD-10-CM | POA: Diagnosis not present

## 2024-03-24 DIAGNOSIS — R109 Unspecified abdominal pain: Secondary | ICD-10-CM | POA: Diagnosis not present

## 2024-03-24 DIAGNOSIS — K5289 Other specified noninfective gastroenteritis and colitis: Secondary | ICD-10-CM | POA: Diagnosis not present

## 2024-03-24 DIAGNOSIS — I878 Other specified disorders of veins: Secondary | ICD-10-CM | POA: Diagnosis not present

## 2024-03-24 NOTE — Plan of Care (Signed)
   Problem: Education: Goal: Knowledge of General Education information will improve Description Including pain rating scale, medication(s)/side effects and non-pharmacologic comfort measures Outcome: Progressing

## 2024-03-24 NOTE — Plan of Care (Signed)
 Patient has had 3 stools today.  Appears bowel blockage has improved.

## 2024-03-24 NOTE — Progress Notes (Addendum)
 PROGRESS NOTE    Susan Ford  FMW:[id number] DOB: [birth year] DOA: 03/22/2024 PCP: Charlett [name], MD    Brief Narrative:  89 y.o. female with medical history significant of osteoarthritis, hyperlipidemia, bilateral cataracts, choledocholithiasis, cholecystitis, history of ERCP, cholecystectomy, single episode transient postcholecystectomy A-fib, history of a flutter, history of anticoagulation, history of GI bleed, GERD, hereditary and idiopathic peripheral neuropathy, history of pulmonary embolism who was sent in via EMS from her nursing facility due to left hip pain, but also was complaining of constipation. He denied fever, chills, rhinorrhea, sore throat, wheezing or hemoptysis.  No chest pain, palpitations, diaphoresis, PND, orthopnea or pitting edema of the lower extremities.  No abdominal pain, nausea, emesis, diarrhea, constipation, melena or hematochezia.  No flank pain, dysuria, frequency or hematuria.  No polyuria, polydipsia, polyphagia or blurred vision.    Lab work:Urinalysis was hazy with ketones of 5 mg/dL, but otherwise negative. CBC was normal. CMP was unremarkable with the exception of a glucose of 109 mg/dL.   Imaging: Left hip x-ray with no fracture or acute finding.  No significant hip joint abnormality.  CT abdomen/pelvis with contrast showing significant rectal distention with stool with mild adjacent perirectal fat inflammation.  Findings consistent with proctitis in the proper clinical setting.  No other acute bowel abnormality.  No evidence of bowel obstruction.  Colonic diverticula without diverticulitis.  Aortic atherosclerosis.    Assessment & Plan:   Principal Problem:   Stercoral colitis Active Problems:   Neuropathy, peripheral   Hyperlipidemia   Dementia without behavioral disturbance (HCC)   Left lower quadrant abdominal pain     Left lower quadrant abdominal pain CT abdomen and pelvis shows significant rectal distention with stool with mild  adjacent perirectal fat inflammation. Findings consistent with proctitis in the proper clinical setting. No other acute bowel abnormality. No evidence of bowel obstruction. Colonic diverticula without diverticulitis. Aortic atherosclerosis. In the setting of: Stercoral colitis Smog enema today Continue IV fluids Avoid narcotics. Continue MiraLAX  senna Dulcolax X-ray is still showing a lot of stool.   Active Problems:   Neuropathy, peripheral Continue gabapentin  300 mg p.o. daily as needed.     Hyperlipidemia Continue simvastatin  10 mg p.o. daily.     Dementia without behavioral disturbance (HCC) Fully oriented at this time. Supportive care as needed.   Estimated body mass index is 19.64 kg/m as calculated from the following:   Height as of this encounter: 5' 4 (1.626 m).   Weight as of this encounter: 51.9 kg.  DVT prophylaxis: lovenox  Code Status:full Family Communication:  Disposition Plan:  Status is: Observation  Consultants:none  Procedures:none Antimicrobials:none  Subjective: Had few small BMs Objective: Vitals:   03/23/24 2058 03/23/24 2107 03/24/24 0420 03/24/24 1340  BP: (!) 144/82 130/86 123/65 124/73  Pulse: (!) 102 89 95 88  Resp: (!) 21 (!) 23 18 18   Temp: 98.7 F (37.1 C) 98.4 F (36.9 C) 98.2 F (36.8 C)   TempSrc:   Oral   SpO2: 96% 95% 96% 96%  Weight:      Height:        Intake/Output Summary (Last 24 hours) at 03/24/2024 1649 Last data filed at 03/24/2024 1400 Gross per 24 hour  Intake 590 ml  Output 300 ml  Net 290 ml   Filed Weights   03/22/24 1109  Weight: 51.9 kg    Examination:  General exam: Appears in mild distress Respiratory system: Clear to auscultation. Respiratory effort normal. Cardiovascular system:reg Gastrointestinal system: Abdomen is nondistended,  soft and nontender. No organomegaly or masses felt. Normal bowel sounds heard. Central nervous system: awake Extremities: no edema   Data Reviewed: I have  personally reviewed following labs and imaging studies  CBC: Recent Labs  Lab 03/20/24 1507 03/22/24 1221 03/23/24 0412  WBC 8.6 9.7 8.6  NEUTROABS 5.8 7.2  --   HGB 12.2 12.7 11.7*  HCT 37.2 37.7 35.1*  MCV 93.9 92.0 92.1  PLT 257 293 259   Basic Metabolic Panel: Recent Labs  Lab 03/20/24 1507 03/22/24 1221 03/23/24 0412  NA 135 136 134*  K 4.4 4.2 3.8  CL 101 102 101  CO2 25 24 22   GLUCOSE 102* 109* 102*  BUN 11 21 15   CREATININE 0.67 0.76 0.66  CALCIUM 9.2 9.3 8.7*   GFR: Estimated Creatinine Clearance: 36.8 mL/min (by C-G formula based on SCr of 0.66 mg/dL). Liver Function Tests: Recent Labs  Lab 03/22/24 1221 03/23/24 0412  AST 29 26  ALT 11 8  ALKPHOS 71 64  BILITOT 0.8 0.6  PROT 6.6 6.0*  ALBUMIN 3.6 3.2*   No results for input(s): LIPASE, AMYLASE in the last 168 hours. No results for input(s): AMMONIA in the last 168 hours. Coagulation Profile: No results for input(s): INR, PROTIME in the last 168 hours. Cardiac Enzymes: No results for input(s): CKTOTAL, CKMB, CKMBINDEX, TROPONINI in the last 168 hours. BNP (last 3 results) Recent Labs    05/11/23 1527  PROBNP 61.0   HbA1C: No results for input(s): HGBA1C in the last 72 hours. CBG: No results for input(s): GLUCAP in the last 168 hours. Lipid Profile: No results for input(s): CHOL, HDL, LDLCALC, TRIG, CHOLHDL, LDLDIRECT in the last 72 hours. Thyroid  Function Tests: No results for input(s): TSH, T4TOTAL, FREET4, T3FREE, THYROIDAB in the last 72 hours. Anemia Panel: No results for input(s): VITAMINB12, FOLATE, FERRITIN, TIBC, IRON, RETICCTPCT in the last 72 hours. Sepsis Labs: No results for input(s): PROCALCITON, LATICACIDVEN in the last 168 hours.  No results found for this or any previous visit (from the past 240 hours).       Radiology Studies: DG Abd 1 View Result Date: 03/24/2024 EXAM: 1 VIEW XRAY OF THE ABDOMEN  03/24/2024 04:15:00 AM COMPARISON: Portable abdomen film yesterday 10:39 am. CLINICAL HISTORY: [id number] Abdominal pain [id number] Abdominal pain. FINDINGS: BOWEL: Nonobstructive bowel gas pattern. SOFT TISSUES: Cholecystectomy clips are present. Pelvic phleboliths are noted. No opaque urinary calculi. BONES: Mild levoscoliosis and advanced degenerative change of the lumbar spine. LUNG BASES: Fibrotic change in the lung bases with cardiomegaly. IMPRESSION: 1. No acute abdominal abnormality identified. Electronically signed by: Francis Quam MD 03/24/2024 05:39 AM EST RP Workstation: [id number]   DG Abd 1 View Result Date: 03/23/2024 EXAM: 1 VIEW XRAY OF THE ABDOMEN 03/23/2024 10:41:00 AM COMPARISON: 09/22/2023 CLINICAL HISTORY: Abdominal pain FINDINGS: BOWEL: Nonobstructive bowel gas pattern. SOFT TISSUES: Cholecystectomy clips in place. Aortoiliac calcified atherosclerosis. Contrast medium observed in the urinary bladder, likely left over from yesterday's CT scan. No opaque urinary calculi. BONES: Advanced lumbar disc and endplate degeneration. No acute osseous abnormality. LUNGS: Stable streaky opacities at the lung bases. HEART AND MEDIASTINUM: Mild cardiomegaly. IMPRESSION: 1. No acute abdominal findings. 2. Advanced lumbar disc and endplate degeneration. 3. Aortoiliac calcified atherosclerosis. 4. Mild cardiomegaly. 5. Stable streaky opacities at the lung bases. Electronically signed by: Ryan Salvage MD 03/23/2024 04:14 PM EST RP Workstation: [id number]     Scheduled Meds:  bisacodyl   10 mg Oral Daily   enoxaparin  (LOVENOX ) injection  30 mg Subcutaneous Q24H  polyethylene glycol  17 g Oral BID   QUEtiapine  12.5 mg Oral QHS   senna  2 tablet Oral QHS   simvastatin   10 mg Oral Daily   Continuous Infusions:     LOS: 1 day    Almarie KANDICE Hoots, MD  03/24/2024, 4:49 PM

## 2024-03-25 ENCOUNTER — Other Ambulatory Visit: Payer: Self-pay

## 2024-03-25 ENCOUNTER — Encounter (HOSPITAL_COMMUNITY): Payer: Self-pay

## 2024-03-25 ENCOUNTER — Emergency Department (HOSPITAL_COMMUNITY)

## 2024-03-25 ENCOUNTER — Emergency Department (HOSPITAL_COMMUNITY): Admission: EM | Admit: 2024-03-25 | Discharge: 2024-03-26 | Disposition: A | Discharge status: Skilled Nursing Facility

## 2024-03-25 DIAGNOSIS — M25552 Pain in left hip: Secondary | ICD-10-CM | POA: Diagnosis present

## 2024-03-25 DIAGNOSIS — F0390 Unspecified dementia without behavioral disturbance: Secondary | ICD-10-CM | POA: Diagnosis not present

## 2024-03-25 DIAGNOSIS — S199XXA Unspecified injury of neck, initial encounter: Secondary | ICD-10-CM | POA: Diagnosis not present

## 2024-03-25 DIAGNOSIS — R4182 Altered mental status, unspecified: Secondary | ICD-10-CM | POA: Diagnosis not present

## 2024-03-25 DIAGNOSIS — I771 Stricture of artery: Secondary | ICD-10-CM | POA: Diagnosis not present

## 2024-03-25 DIAGNOSIS — K5289 Other specified noninfective gastroenteritis and colitis: Secondary | ICD-10-CM | POA: Diagnosis not present

## 2024-03-25 DIAGNOSIS — M50322 Other cervical disc degeneration at C5-C6 level: Secondary | ICD-10-CM | POA: Diagnosis not present

## 2024-03-25 DIAGNOSIS — Z043 Encounter for examination and observation following other accident: Secondary | ICD-10-CM | POA: Diagnosis not present

## 2024-03-25 DIAGNOSIS — M47816 Spondylosis without myelopathy or radiculopathy, lumbar region: Secondary | ICD-10-CM | POA: Diagnosis not present

## 2024-03-25 DIAGNOSIS — N390 Urinary tract infection, site not specified: Secondary | ICD-10-CM | POA: Diagnosis not present

## 2024-03-25 DIAGNOSIS — R9431 Abnormal electrocardiogram [ECG] [EKG]: Secondary | ICD-10-CM | POA: Diagnosis not present

## 2024-03-25 DIAGNOSIS — W19XXXA Unspecified fall, initial encounter: Secondary | ICD-10-CM | POA: Diagnosis not present

## 2024-03-25 DIAGNOSIS — I700 Atherosclerosis of aorta: Secondary | ICD-10-CM | POA: Diagnosis not present

## 2024-03-25 DIAGNOSIS — I6782 Cerebral ischemia: Secondary | ICD-10-CM | POA: Diagnosis not present

## 2024-03-25 DIAGNOSIS — K529 Noninfective gastroenteritis and colitis, unspecified: Secondary | ICD-10-CM | POA: Diagnosis not present

## 2024-03-25 DIAGNOSIS — M47812 Spondylosis without myelopathy or radiculopathy, cervical region: Secondary | ICD-10-CM | POA: Diagnosis not present

## 2024-03-25 LAB — COMPREHENSIVE METABOLIC PANEL WITH GFR
ALT: 9 U/L (ref 0–44)
ALT: 9 U/L (ref 0–44)
AST: 19 U/L (ref 15–41)
AST: 19 U/L (ref 15–41)
Albumin: 3.3 g/dL — ABNORMAL LOW (ref 3.5–5.0)
Albumin: 3.6 g/dL (ref 3.5–5.0)
Alkaline Phosphatase: 81 U/L (ref 38–126)
Alkaline Phosphatase: 79 U/L (ref 38–126)
Anion gap: 10 (ref 5–15)
Anion gap: 10 (ref 5–15)
BUN: 15 mg/dL (ref 8–23)
BUN: 17 mg/dL (ref 8–23)
CO2: 22 mmol/L (ref 22–32)
CO2: 25 mmol/L (ref 22–32)
Calcium: 8.8 mg/dL — ABNORMAL LOW (ref 8.9–10.3)
Calcium: 9 mg/dL (ref 8.9–10.3)
Chloride: 99 mmol/L (ref 98–111)
Chloride: 99 mmol/L (ref 98–111)
Creatinine, Ser: 0.68 mg/dL (ref 0.44–1.00)
Creatinine, Ser: 0.8 mg/dL (ref 0.44–1.00)
GFR, Estimated: >60 mL/min (ref >60)
GFR, Estimated: >60 mL/min (ref >60)
Glucose, Bld: 107 mg/dL — ABNORMAL HIGH (ref 70–99)
Glucose, Bld: 116 mg/dL — ABNORMAL HIGH (ref 70–99)
Potassium: 4 mmol/L (ref 3.5–5.1)
Potassium: 4.3 mmol/L (ref 3.5–5.1)
Sodium: 132 mmol/L — ABNORMAL LOW (ref 135–145)
Sodium: 134 mmol/L — ABNORMAL LOW (ref 135–145)
Total Bilirubin: 0.5 mg/dL (ref 0.0–1.2)
Total Bilirubin: 0.6 mg/dL (ref 0.0–1.2)
Total Protein: 6.2 g/dL — ABNORMAL LOW (ref 6.5–8.1)
Total Protein: 6.8 g/dL (ref 6.5–8.1)

## 2024-03-25 LAB — CBC
HCT: 38 % (ref 36.0–46.0)
HCT: 38.8 % (ref 36.0–46.0)
Hemoglobin: 12.5 g/dL (ref 12.0–15.0)
Hemoglobin: 13 g/dL (ref 12.0–15.0)
MCH: 30.9 pg (ref 26.0–34.0)
MCH: 30.6 pg (ref 26.0–34.0)
MCHC: 32.9 g/dL (ref 30.0–36.0)
MCHC: 33.5 g/dL (ref 30.0–36.0)
MCV: 93.8 fL (ref 80.0–100.0)
MCV: 91.3 fL (ref 80.0–100.0)
Platelets: 301 K/uL (ref 150–400)
Platelets: 312 K/uL (ref 150–400)
RBC: 4.05 MIL/uL (ref 3.87–5.11)
RBC: 4.25 MIL/uL (ref 3.87–5.11)
RDW: 12.2 % (ref 11.5–15.5)
RDW: 12 % (ref 11.5–15.5)
WBC: 9.3 K/uL (ref 4.0–10.5)
WBC: 11.3 K/uL — ABNORMAL HIGH (ref 4.0–10.5)
nRBC: 0 % (ref 0.0–0.2)
nRBC: 0 % (ref 0.0–0.2)

## 2024-03-25 LAB — CBG MONITORING, ED: Glucose-Capillary: 110 mg/dL — ABNORMAL HIGH (ref 70–99)

## 2024-03-25 LAB — I-STAT CG4 LACTIC ACID, ED: Lactic Acid, Venous: 1.2 mmol/L (ref 0.5–1.9)

## 2024-03-25 MED ORDER — BISACODYL 5 MG PO TBEC: 10.0000 mg | DELAYED_RELEASE_TABLET | Freq: Every day | ORAL | 0 refills | Status: DC

## 2024-03-25 MED ORDER — ONDANSETRON HCL 4 MG PO TABS: 4.0000 mg | ORAL_TABLET | Freq: Four times a day (QID) | ORAL | 0 refills | Status: DC | PRN

## 2024-03-25 MED ORDER — ENOXAPARIN SODIUM 40 MG/0.4ML IJ SOSY: 40.0000 mg | PREFILLED_SYRINGE | INTRAMUSCULAR | Status: DC

## 2024-03-25 MED ORDER — SENNA 8.6 MG PO TABS: 2.0000 | ORAL_TABLET | Freq: Every day | ORAL | 0 refills | Status: AC

## 2024-03-25 MED ORDER — POLYETHYLENE GLYCOL 3350 17 G PO PACK: 17.0000 g | PACK | Freq: Two times a day (BID) | ORAL | 0 refills | Status: DC

## 2024-03-25 NOTE — ED Provider Notes (Signed)
 [place] EMERGENCY DEPARTMENT AT [facility] Provider Note   CSN: [id number] Arrival date & time: 03/25/24  2106     Patient presents with: Fall (Complaints of left hip pain from fall; denied any other body pain, LOC, or hitting of head.)   Susan Ford is a 89 y.o. female.   This is a 89 year old female presenting the emergency department after reported fall.  Patient with some underlying dementia.  However alert and oriented x 3 currently.  She is denying falling and has no current complaints.  EMS reports patient complained initially of some left hip pain and was placed on 2 L of oxygen for shortness of breath.  Not on a blood thinner.  Recent hospitalization for constipation.  Patient has no complaints currently denies headache, vision changes, chest pain, shortness of breath, abdominal pain.  Notes last bowel movement was today.   Fall       Prior to Admission medications   Medication Sig Start Date End Date Taking? Authorizing Provider  acetaminophen  (TYLENOL ) 325 MG tablet Take 650 mg by mouth every 6 (six) hours as needed for moderate pain (pain score 4-6) or mild pain (pain score 1-3).    [provider]  Ascorbic Acid  (VITAMIN C ) 1000 MG tablet Take 1,000 mg by mouth daily.    [provider]  bisacodyl  (DULCOLAX) 5 MG EC tablet Take 2 tablets (10 mg total) by mouth daily. 03/26/24   Will Almarie MATSU, MD  gabapentin  (NEURONTIN ) 300 MG capsule TAKE 1 CAPSULE BY MOUTH EVERYDAY AT BEDTIME Patient taking differently: Take 300 mg by mouth daily as needed (FOR PAIN). 11/14/23   Panosh, Wanda K, MD  Multiple Vitamin (MULTIVITAMIN) tablet Take 1 tablet by mouth daily after lunch.    [provider]  ondansetron  (ZOFRAN ) 4 MG tablet Take 1 tablet (4 mg total) by mouth every 6 (six) hours as needed for nausea. 03/25/24   Will Almarie MATSU, MD  polyethylene glycol (MIRALAX  / GLYCOLAX ) 17 g packet Take 17 g by mouth 2 (two) times  daily. 03/25/24   Will Almarie MATSU, MD  PREVAGEN EXTRA STRENGTH 20 MG CAPS Take 20 mg by mouth daily.    [provider]  senna (SENOKOT) 8.6 MG TABS tablet Take 2 tablets (17.2 mg total) by mouth at bedtime. 03/25/24   Will Almarie MATSU, MD  simvastatin  (ZOCOR ) 10 MG tablet TAKE 1 TABLET BY MOUTH EVERY DAY 02/15/24   Panosh, Wanda K, MD    Allergies: Keflex  [cephalexin ], Lyrica [pregabalin], Oxytetracycline, and Memantine     Review of Systems  Updated Vital Signs BP (!) 162/114 (BP Location: Right Arm)   Pulse 97   Temp 98 F (36.7 C) (Oral)   Resp 16   SpO2 97%   Physical Exam Vitals and nursing note reviewed.  Constitutional:      General: She is not in acute distress. HENT:     Head: Normocephalic.     Nose: Nose normal.     Mouth/Throat:     Mouth: Mucous membranes are moist.  Eyes:     Conjunctiva/sclera: Conjunctivae normal.     Pupils: Pupils are equal, round, and reactive to light.  Cardiovascular:     Rate and Rhythm: Normal rate and regular rhythm.  Pulmonary:     Effort: Pulmonary effort is normal.     Breath sounds: Normal breath sounds.  Abdominal:     General: Abdomen is flat. There is no distension.     Palpations: Abdomen  is soft.     Tenderness: There is no abdominal tenderness. There is no guarding or rebound.  Musculoskeletal:     Cervical back: Normal range of motion.     Comments: No midline tenderness.  5-5 bicep strength tricep strength, plantarflexion dorsiflexion.  Chest wall stable nontender.  Pelvis stable nontender.  Skin:    General: Skin is warm.     Capillary Refill: Capillary refill takes less than 2 seconds.  Neurological:     General: No focal deficit present.     Mental Status: She is alert. Mental status is at baseline.  Psychiatric:        Mood and Affect: Mood normal.        Behavior: Behavior normal.     (all labs ordered are listed, but only abnormal results are displayed) Labs Reviewed  CBC - Abnormal;  Notable for the following components:      Result Value   WBC 11.3 (*)    All other components within normal limits  COMPREHENSIVE METABOLIC PANEL WITH GFR - Abnormal; Notable for the following components:   Sodium 134 (*)    Glucose, Bld 116 (*)    All other components within normal limits  CBG MONITORING, ED - Abnormal; Notable for the following components:   Glucose-Capillary 110 (*)    All other components within normal limits  URINALYSIS, ROUTINE W REFLEX MICROSCOPIC  I-STAT CG4 LACTIC ACID, ED  I-STAT CG4 LACTIC ACID, ED    EKG: EKG Interpretation Date/Time:  Sunday March 25 2024 22:36:03 EST Ventricular Rate:  82 PR Interval:  199 QRS Duration:  91 QT Interval:  374 QTC Calculation: 437 R Axis:   -6  Text Interpretation: Sinus rhythm RSR' in V1 or V2, probably normal variant Consider anterior infarct Confirmed by Neysa Clap [id number]) on 03/25/2024 11:22:57 PM  Radiology: CT Cervical Spine Wo Contrast Result Date: 03/25/2024 EXAM: CT CERVICAL SPINE WITHOUT CONTRAST 03/25/2024 10:36:00 PM TECHNIQUE: CT of the cervical spine was performed without the administration of intravenous contrast. Multiplanar reformatted images are provided for review. Automated exposure control, iterative reconstruction, and/or weight based adjustment of the mA/kV was utilized to reduce the radiation dose to as low as reasonably achievable. COMPARISON: None available. CLINICAL HISTORY: Neck trauma (Age >= 65y) FINDINGS: CERVICAL SPINE: BONES AND ALIGNMENT: No acute fracture or traumatic malalignment. DEGENERATIVE CHANGES: Multilevel spondylosis, disc space height loss, and degenerative endplate changes greatest at C5-C6 and C6-C7. Multilevel advanced facet arthropathy bilaterally. No severe spinal canal narrowing. SOFT TISSUES: No prevertebral soft tissue swelling. IMPRESSION: 1. No acute abnormality of the cervical spine. Electronically signed by: Norman Gatlin MD 03/25/2024 10:55 PM EST RP  Workstation: [id number]   CT Head Wo Contrast Result Date: 03/25/2024 EXAM: CT HEAD WITHOUT CONTRAST 03/25/2024 10:36:00 PM TECHNIQUE: CT of the head was performed without the administration of intravenous contrast. Automated exposure control, iterative reconstruction, and/or weight based adjustment of the mA/kV was utilized to reduce the radiation dose to as low as reasonably achievable. COMPARISON: Comparison to study dated 02/08/2024. CLINICAL HISTORY: Mental status change, unknown cause. FINDINGS: BRAIN AND VENTRICLES: No acute hemorrhage. No evidence of acute infarct. No hydrocephalus. No extra-axial collection. No mass effect or midline shift. Chronic microvascular ischemia and generalized atrophy. ORBITS: No acute abnormality. SINUSES: No acute abnormality. Debris in the ethmoid air cells and sphenoid sinuses. SOFT TISSUES AND SKULL: No acute soft tissue abnormality. No skull fracture. IMPRESSION: 1. No acute intracranial abnormality. Electronically signed by: Norman Gatlin MD 03/25/2024 10:52 PM EST  RP Workstation: [id number]   DG Pelvis 1-2 Views Result Date: 03/25/2024 EXAM: 1 OR 2 VIEW(S) XRAY OF THE PELVIS 03/25/2024 10:22:07 PM COMPARISON: None available. CLINICAL HISTORY: [id number] Fall [id number] FINDINGS: BONES AND JOINTS: Spondylitic changes in the visualized lower lumbar spine. No focal osseous lesion. No no acute fracture or joint dislocation. SOFT TISSUES: Atherosclerotic vascular calcifications. IMPRESSION: 1. No acute fracture. Electronically signed by: Norman Gatlin MD 03/25/2024 10:40 PM EST RP Workstation: [id number]   DG Chest 2 View Result Date: 03/25/2024 EXAM: 2 VIEW(S) XRAY OF THE CHEST 03/25/2024 10:22:07 PM COMPARISON: 02/08/2024 CLINICAL HISTORY: AMS FINDINGS: LUNGS AND PLEURA: No focal pulmonary opacity. No pleural effusion. No pneumothorax. HEART AND MEDIASTINUM: Aortic atherosclerosis and tortuosity. No acute abnormality of the cardiac and mediastinal silhouettes. BONES AND  SOFT TISSUES: Thoracic degenerative disc disease. IMPRESSION: 1. No acute cardiopulmonary findings. Electronically signed by: Norman Gatlin MD 03/25/2024 10:39 PM EST RP Workstation: [id number]   DG Abd 1 View Result Date: 03/24/2024 EXAM: 1 VIEW XRAY OF THE ABDOMEN 03/24/2024 04:15:00 AM COMPARISON: Portable abdomen film yesterday 10:39 am. CLINICAL HISTORY: [id number] Abdominal pain [id number] Abdominal pain. FINDINGS: BOWEL: Nonobstructive bowel gas pattern. SOFT TISSUES: Cholecystectomy clips are present. Pelvic phleboliths are noted. No opaque urinary calculi. BONES: Mild levoscoliosis and advanced degenerative change of the lumbar spine. LUNG BASES: Fibrotic change in the lung bases with cardiomegaly. IMPRESSION: 1. No acute abdominal abnormality identified. Electronically signed by: Francis Quam MD 03/24/2024 05:39 AM EST RP Workstation: [id number]     Procedures   Medications Ordered in the ED - No data to display  Clinical Course as of 03/25/24 2358  Sun Mar 25, 2024  2323 Discussed with son who notes that patient had a fall after she came home from the hospital.  Was reported to him that she had an abnormal oxygen saturation at 93% and was needing oxygen.  Also that she had an abnormal heartbeat.  He notes that she has some waxing and waning dementia/memory issues and from he describes it appears that she is at baseline.  He is comfortable with her going back to facility after discussion of workup thus far.  Awaiting UA. [TY]  2329 Stable HO TJY Just discharged from hospital Found down at [facility]. HX Dementia At baseline per family. (Different from EMS report) Just pending UA.  [CC]    Clinical Course User Index [CC] Jerral Meth, MD [TY] Neysa Caron PARAS, DO                                 Medical Decision Making This is a 89 year old female past medical history to include GERD, PE, hyperlipidemia, dementia presenting emergency department after a fall.  Discharged today from the  hospital for constipation and possible stercoral colitis/proctitis.  She is afebrile vital signs reassuring.  Physical exam without overt injury.  Patient is alert and oriented x 3.  Triage note states that she is altered.  She is answering questions appropriately to me.  Did discuss with son; see ED course.  Labs reassuring.  No significant metabolic derangements.  Normal kidney function.  No transaminitis.  Lactate not elevated.  Does have mild leukocytosis, but stable.  Imaging reassuring.  Plan for UA and discharge.  Care signed out to overnight team disposition pending UA  Amount and/or Complexity of Data Reviewed External Data Reviewed:     Details: Not on blood thinner.  Hospitalized for constipation.  Not discharged on  antibiotics Labs: ordered. Decision-making details documented in ED Course.    Details: See ED course Radiology: ordered and independent interpretation performed.    Details: Do not appreciate obvious intracranial hemorrhage.  Chest x-ray without pneumonia or pneumothorax.  Pelvis x-ray without obvious fracture ECG/medicine tests: ordered and independent interpretation performed.    Details: No ischemic changes  Risk Decision regarding hospitalization. Diagnosis or treatment significantly limited by social determinants of health.       Final diagnoses:  None    ED Discharge Orders     None          Neysa Caron PARAS, DO 03/25/24 2358

## 2024-03-25 NOTE — Discharge Summary (Signed)
 Physician Discharge Summary  NIVIA Susan Ford FMW:[id number] DOB: 10/04/1929 DOA: 03/22/2024  PCP: Charlett [name], MD  Admit date: 03/22/2024 Discharge date: 03/25/2024  Admitted From: Independent living facility Disposition: Independent living  Recommendations for Outpatient Follow-up:  Follow up with PCP in 1-2 weeks Please obtain BMP/CBC in one week Please ensure patient stays on stool softeners daily with laxatives as needed   Home Health: Yes Equipment/Devices: None Discharge Condition: Stable CODE STATUS DNR Diet recommendation: Cardiac Brief/Interim Summary:   89 y.o. female with medical history significant of osteoarthritis, hyperlipidemia, bilateral cataracts, choledocholithiasis, cholecystitis, history of ERCP, cholecystectomy, single episode transient postcholecystectomy A-fib, history of a flutter, history of anticoagulation, history of GI b Left lower quadrant abdominal pain CT abdomen and pelvis shows significant rectal distention with stool with mild adjacent perirectal fat inflammation. Findings consistent with proctitis in the proper clinical setting. No other acute bowel abnormality. No evidence of bowel obstruction. Colonic diverticula without diverticulitis. Aortic atherosclerosis. In the setting of: Stercoral colitis Status post smog enema MiraLAX  senna and Dulcolax She is medically stable to be discharged will check with memory care if they can take her today.   Active Problems:   Neuropathy, peripheral Continue gabapentin  300 mg p.o. daily as needed.     Hyperlipidemia Continue simvastatin  10 mg p.o. daily.     Dementia without behavioral disturbance (HCC) Fully oriented at this time. Supportive care as needed.leed, GERD, hereditary and idiopathic peripheral neuropathy, history of pulmonary embolism who was sent in via EMS from her nursing facility due to left hip pain, but also was complaining of constipation. He denied fever, chills, rhinorrhea, sore  throat, wheezing or hemoptysis.  No chest pain, palpitations, diaphoresis, PND, orthopnea or pitting edema of the lower extremities.  No abdominal pain, nausea, emesis, diarrhea, constipation, melena or hematochezia.  No flank pain, dysuria, frequency or hematuria.  No polyuria, polydipsia, polyphagia or blurred vision.    Lab work:Urinalysis was hazy with ketones of 5 mg/dL, but otherwise negative. CBC was normal. CMP was unremarkable with the exception of a glucose of 109 mg/dL.   Imaging: Left hip x-ray with no fracture or acute finding.  No significant hip joint abnormality.  CT abdomen/pelvis with contrast showing significant rectal distention with stool with mild adjacent perirectal fat inflammation.  Findings consistent with proctitis in the proper clinical setting.  No other acute bowel abnormality.  No evidence of bowel obstruction.  Colonic diverticula without diverticulitis.  Aortic atherosclerosis.   Discharge Diagnoses:  Principal Problem:   Stercoral colitis Active Problems:   Neuropathy, peripheral   Hyperlipidemia   Dementia without behavioral disturbance (HCC)   Left lower quadrant abdominal pain     Left lower quadrant abdominal pain CT abdomen and pelvis showed significant rectal distention with stool with mild adjacent perirectal fat inflammation. Findings consistent with proctitis in the proper clinical setting. No other acute bowel abnormality. No evidence of bowel obstruction. Colonic diverticula without diverticulitis. Aortic atherosclerosis.  She was treated with smog enema MiraLAX  senna and Dulcolax She is medically stable to be discharged  Active Problems:   Neuropathy, peripheral Continue gabapentin  300 mg p.o. daily as needed.     Hyperlipidemia Continue simvastatin  10 mg p.o. daily.     Dementia without behavioral disturbance (HCC) Fully oriented at this time. Supportive care as needed.   Estimated body mass index is 19.64 kg/m as calculated from the  following:   Height as of this encounter: 5' 4 (1.626 m).   Weight as of this encounter:  51.9 kg.  Discharge Instructions  Discharge Instructions     Diet - low sodium heart healthy   Complete by: As directed    Increase activity slowly   Complete by: As directed       Allergies as of 03/25/2024       Reactions   Keflex  [cephalexin ] Other (See Comments)   Dizziness   Lyrica [pregabalin] Other (See Comments)   High Fever   Oxytetracycline Other (See Comments)   High Fever caused by terramycin   Memantine  Rash        Medication List     STOP taking these medications    doxycycline  100 MG capsule Commonly known as: VIBRAMYCIN    Vibegron  75 MG Tabs       TAKE these medications    acetaminophen  325 MG tablet Commonly known as: TYLENOL  Take 650 mg by mouth every 6 (six) hours as needed for moderate pain (pain score 4-6) or mild pain (pain score 1-3).   bisacodyl  5 MG EC tablet Commonly known as: DULCOLAX Take 2 tablets (10 mg total) by mouth daily. Start taking on: March 26, 2024   gabapentin  300 MG capsule Commonly known as: NEURONTIN  TAKE 1 CAPSULE BY MOUTH EVERYDAY AT BEDTIME What changed: See the new instructions.   multivitamin tablet Take 1 tablet by mouth daily after lunch.   ondansetron  4 MG tablet Commonly known as: ZOFRAN  Take 1 tablet (4 mg total) by mouth every 6 (six) hours as needed for nausea.   polyethylene glycol 17 g packet Commonly known as: MIRALAX  / GLYCOLAX  Take 17 g by mouth 2 (two) times daily.   Prevagen Extra Strength 20 MG Caps Generic drug: Apoaequorin Take 20 mg by mouth daily.   senna 8.6 MG Tabs tablet Commonly known as: SENOKOT Take 2 tablets (17.2 mg total) by mouth at bedtime. What changed:  how much to take when to take this   simvastatin  10 MG tablet Commonly known as: ZOCOR  TAKE 1 TABLET BY MOUTH EVERY DAY   vitamin C  1000 MG tablet Take 1,000 mg by mouth daily.        Follow-up Information      Panosh, [name], MD Follow up.   Specialties: Internal Medicine, Pediatrics Contact information: [address] [place] [address] [phone]                Allergies  Allergen Reactions   Keflex  [Cephalexin ] Other (See Comments)    Dizziness   Lyrica [Pregabalin] Other (See Comments)    High Fever    Oxytetracycline Other (See Comments)    High Fever caused by terramycin    Memantine  Rash    Consultations: None   Procedures/Studies: DG Abd 1 View Result Date: 03/24/2024 EXAM: 1 VIEW XRAY OF THE ABDOMEN 03/24/2024 04:15:00 AM COMPARISON: Portable abdomen film yesterday 10:39 am. CLINICAL HISTORY: [id number] Abdominal pain [id number] Abdominal pain. FINDINGS: BOWEL: Nonobstructive bowel gas pattern. SOFT TISSUES: Cholecystectomy clips are present. Pelvic phleboliths are noted. No opaque urinary calculi. BONES: Mild levoscoliosis and advanced degenerative change of the lumbar spine. LUNG BASES: Fibrotic change in the lung bases with cardiomegaly. IMPRESSION: 1. No acute abdominal abnormality identified. Electronically signed by: Francis Quam MD 03/24/2024 05:39 AM EST RP Workstation: [id number]   DG Abd 1 View Result Date: 03/23/2024 EXAM: 1 VIEW XRAY OF THE ABDOMEN 03/23/2024 10:41:00 AM COMPARISON: 09/22/2023 CLINICAL HISTORY: Abdominal pain FINDINGS: BOWEL: Nonobstructive bowel gas pattern. SOFT TISSUES: Cholecystectomy clips in place. Aortoiliac calcified atherosclerosis. Contrast medium observed in  the urinary bladder, likely left over from yesterday's CT scan. No opaque urinary calculi. BONES: Advanced lumbar disc and endplate degeneration. No acute osseous abnormality. LUNGS: Stable streaky opacities at the lung bases. HEART AND MEDIASTINUM: Mild cardiomegaly. IMPRESSION: 1. No acute abdominal findings. 2. Advanced lumbar disc and endplate degeneration. 3. Aortoiliac calcified atherosclerosis. 4. Mild cardiomegaly. 5. Stable streaky opacities at the lung  bases. Electronically signed by: Ryan Salvage MD 03/23/2024 04:14 PM EST RP Workstation: [id number]   CT ABDOMEN PELVIS W CONTRAST Result Date: 03/22/2024 CLINICAL DATA:  Clinical concern for bowel obstruction. EXAM: CT ABDOMEN AND PELVIS WITH CONTRAST TECHNIQUE: Multidetector CT imaging of the abdomen and pelvis was performed using the standard protocol following bolus administration of intravenous contrast. RADIATION DOSE REDUCTION: This exam was performed according to the departmental dose-optimization program which includes automated exposure control, adjustment of the mA and/or kV according to patient size and/or use of iterative reconstruction technique. CONTRAST:  80mL OMNIPAQUE  IOHEXOL  300 MG/ML  SOLN COMPARISON:  Unenhanced abdomen and pelvis CT, 03/20/2024. FINDINGS: Lower chest: Patchy ground-glass and reticular opacities noted at the lung bases, similar to the recent prior CT, consistent with chronic interstitial lung disease. Hepatobiliary: Unremarkable liver. Status post cholecystectomy. Mild chronic intrahepatic bile duct dilation. Pancreas: Unremarkable. No pancreatic ductal dilatation or surrounding inflammatory changes. Spleen: Normal in size without focal abnormality. Adrenals/Urinary Tract: Normal adrenal glands. Mild renal cortical thinning. Symmetric renal enhancement and excretion. No renal masses or stones. No hydronephrosis. Normal ureters. Bladder mostly decompressed, otherwise unremarkable. Stomach/Bowel: Rectum is significantly distended with stool. Mild perirectal fat haziness consistent with inflammation. Stomach mostly decompressed, otherwise unremarkable. Small bowel and colon are normal in caliber. No wall thickening. No inflammation. Multiple colonic diverticula mostly on the left without evidence of diverticulitis. Vascular/Lymphatic: Stable aortic atherosclerotic calcifications. No aneurysm. No enlarged lymph nodes. Reproductive: Status post hysterectomy. No adnexal  masses. Other: No abdominal wall hernia or abnormality. No abdominopelvic ascites. Musculoskeletal: No acute fracture. Mild chronic compression fracture of T11, stable from the prior CT. No bone lesion. Degenerative changes noted throughout the visualized spine. Skeletal structures are demineralized. IMPRESSION: 1. Significant rectal distention with stool with mild adjacent perirectal fat inflammation. Findings consistent with proctitis in the proper clinical setting. 2. No other acute bowel abnormality. No evidence of bowel obstruction. 3. Colonic diverticula without diverticulitis. 4. Aortic atherosclerosis. Electronically Signed   By: Alm Parkins M.D.   On: 03/22/2024 14:21   DG Hip Unilat W or Wo Pelvis 2-3 Views Left Result Date: 03/22/2024 CLINICAL DATA:  Left hip pain.  No injury. EXAM: DG HIP (WITH OR WITHOUT PELVIS) 2-3V LEFT COMPARISON:  None Available. FINDINGS: No fracture or bone lesion. Hip joints are normally spaced and aligned. No significant arthropathic change. SI joints and pubic symphysis are also normally aligned. There are disc degenerative changes of the visualized lower lumbar spine. Skeletal structures are demineralized. Aortoiliac atherosclerotic calcifications. Soft tissues otherwise unremarkable. IMPRESSION: 1. No fracture or acute finding. No significant hip joint abnormality. Electronically Signed   By: Alm Parkins M.D.   On: 03/22/2024 13:48   CT Renal Stone Study Result Date: 03/20/2024 CLINICAL DATA:  Abdominal pain.  Renal stone suspected. EXAM: CT ABDOMEN AND PELVIS WITHOUT CONTRAST TECHNIQUE: MULTIDETECTOR CT IMAGING OF THE ABDOMEN AND PELVIS WAS PERFORMED FOLLOWING THE STANDARD PROTOCOL WITHOUT IV CONTRAST.: TECHNIQUE: MULTIDETECTOR CT IMAGING OF THE ABDOMEN AND PELVIS WAS PERFORMED FOLLOWING THE STANDARD PROTOCOL WITHOUT IV CONTRAST. RADIATION DOSE REDUCTION: This exam was performed according to the departmental dose-optimization program  which includes automated  exposure control, adjustment of the mA and/or kV according to patient size and/or use of iterative reconstruction technique. COMPARISON:  CT 01/25/2024 FINDINGS: Lower chest: Interstitial lung disease again noted lung bases. Hepatobiliary: Postcholecystectomy. Pancreas: Pancreas is normal. No ductal dilatation. No pancreatic inflammation. Spleen: Normal spleen Adrenals/urinary tract: No nephrolithiasis or ureterolithiasis. No obstructive uropathy. No bladder calculi. Stomach/Bowel: Stomach, small bowel, appendix, and cecum are normal. LEFT lateral abdominal wall hernia contains loop of nonobstructed small bowel. Several diverticula LEFT colon. No acute inflammation. Vascular/Lymphatic: Abdominal aorta is normal caliber with atherosclerotic calcification. There is no retroperitoneal or periportal lymphadenopathy. No pelvic lymphadenopathy. Reproductive: Post hysterectomy.  Adnexa unremarkable Other: No free fluid. Musculoskeletal: No aggressive osseous lesion. Healing RIGHT inferior pubic ramus fracture. IMPRESSION: 1. No nephrolithiasis ureterolithiasis or obstructive uropathy. 2. Stable LEFT lateral abdominal wall hernia containing loop of nonobstructed small bowel. 3. LEFT colon diverticulosis without diverticulitis Electronically Signed   By: Jackquline Boxer M.D.   On: 03/20/2024 15:04   (Echo, Carotid, EGD, Colonoscopy, ERCP)    Subjective:  Resting in bed had BMs overnight Discharge Exam: Vitals:   03/24/24 1941 03/25/24 0525  BP: (!) 146/85 117/79  Pulse: 74 (!) 102  Resp: 20 16  Temp: 97.6 F (36.4 C)   SpO2: 99% 94%   Vitals:   03/24/24 0420 03/24/24 1340 03/24/24 1941 03/25/24 0525  BP: 123/65 124/73 (!) 146/85 117/79  Pulse: 95 88 74 (!) 102  Resp: 18 18 20 16   Temp: 98.2 F (36.8 C)  97.6 F (36.4 C)   TempSrc: Oral     SpO2: 96% 96% 99% 94%  Weight:      Height:        General: Pt is alert, awake, not in acute distress Cardiovascular: RRR, S1/S2 +, no rubs, no  gallops Respiratory: CTA bilaterally, no wheezing, no rhonchi Abdominal: Soft, NT, ND, bowel sounds + Extremities: no edema, no cyanosis    The results of significant diagnostics from this hospitalization (including imaging, microbiology, ancillary and laboratory) are listed below for reference.     Microbiology: No results found for this or any previous visit (from the past 240 hours).   Labs: BNP (last 3 results) Recent Labs    07/06/23 1631  BNP 66.0   Basic Metabolic Panel: Recent Labs  Lab 03/20/24 1507 03/22/24 1221 03/23/24 0412 03/25/24 1030  NA 135 136 134* 132*  K 4.4 4.2 3.8 4.0  CL 101 102 101 99  CO2 25 24 22 22   GLUCOSE 102* 109* 102* 107*  BUN 11 21 15 15   CREATININE 0.67 0.76 0.66 0.68  CALCIUM 9.2 9.3 8.7* 8.8*   Liver Function Tests: Recent Labs  Lab 03/22/24 1221 03/23/24 0412 03/25/24 1030  AST 29 26 19   ALT 11 8 9   ALKPHOS 71 64 81  BILITOT 0.8 0.6 0.5  PROT 6.6 6.0* 6.2*  ALBUMIN 3.6 3.2* 3.3*   No results for input(s): LIPASE, AMYLASE in the last 168 hours. No results for input(s): AMMONIA in the last 168 hours. CBC: Recent Labs  Lab 03/20/24 1507 03/22/24 1221 03/23/24 0412 03/25/24 1030  WBC 8.6 9.7 8.6 9.3  NEUTROABS 5.8 7.2  --   --   HGB 12.2 12.7 11.7* 12.5  HCT 37.2 37.7 35.1* 38.0  MCV 93.9 92.0 92.1 93.8  PLT 257 293 259 301   Cardiac Enzymes: No results for input(s): CKTOTAL, CKMB, CKMBINDEX, TROPONINI in the last 168 hours. BNP: Invalid input(s): POCBNP CBG: No results  for input(s): GLUCAP in the last 168 hours. D-Dimer No results for input(s): DDIMER in the last 72 hours. Hgb A1c No results for input(s): HGBA1C in the last 72 hours. Lipid Profile No results for input(s): CHOL, HDL, LDLCALC, TRIG, CHOLHDL, LDLDIRECT in the last 72 hours. Thyroid  function studies No results for input(s): TSH, T4TOTAL, T3FREE, THYROIDAB in the last 72 hours.  Invalid input(s):  FREET3 Anemia work up No results for input(s): VITAMINB12, FOLATE, FERRITIN, TIBC, IRON, RETICCTPCT in the last 72 hours. Urinalysis    Component Value Date/Time   COLORURINE YELLOW 03/22/2024 1312   APPEARANCEUR HAZY (A) 03/22/2024 1312   LABSPEC 1.021 03/22/2024 1312   PHURINE 5.0 03/22/2024 1312   GLUCOSEU NEGATIVE 03/22/2024 1312   GLUCOSEU NEGATIVE 02/26/2016 1031   HGBUR NEGATIVE 03/22/2024 1312   BILIRUBINUR NEGATIVE 03/22/2024 1312   BILIRUBINUR negative 08/17/2023 1517   KETONESUR 5 (A) 03/22/2024 1312   PROTEINUR NEGATIVE 03/22/2024 1312   UROBILINOGEN 0.2 08/17/2023 1517   UROBILINOGEN 0.2 01/23/2022 1345   NITRITE NEGATIVE 03/22/2024 1312   LEUKOCYTESUR NEGATIVE 03/22/2024 1312   Sepsis Labs Recent Labs  Lab 03/20/24 1507 03/22/24 1221 03/23/24 0412 03/25/24 1030  WBC 8.6 9.7 8.6 9.3   Microbiology No results found for this or any previous visit (from the past 240 hours).   Time coordinating discharge: 40 min SIGNED:   Almarie KANDICE Hoots, MD  [facility] 03/25/2024, 1:08 PM

## 2024-03-25 NOTE — TOC Transition Note (Signed)
 Transition of Care [facility]) - Discharge Note   Patient Details  Name: Susan Ford MRN: [id number] Date of Birth: [birth year]  Transition of [facility] CM/SW Contact:  Susan DELENA Saltness, LCSW Phone Number: 03/25/2024, 1:22 PM   Clinical Narrative:    Pt discharging home to [facility]. Pt active with [facility] PT/OT, will continue after discharge. HH orders faxed to [facility] [place], at [phone]. Pt also active with [facility] for home care assistance. D/C packet placed in pt's chart at RN station. PTAR called at 2:01 PM. [name], Susan Ford [phone], made aware and in agreement with discharge plan. No further TOC needs at this time.   Final next level of care: Home/Self Care Barriers to Discharge: Barriers Resolved   Patient Goals and CMS Choice Patient states their goals for this hospitalization and ongoing recovery are:: To return home to ILF CMS Medicare.gov Compare Post Acute Care list provided to::  (NA) Choice offered to / list presented to : NA [place] ownership interest in [facility]provided to:: Parent NA    Discharge Placement  [facility]              Patient to be transferred to facility by: PTAR Name of family member notified: Susan Ford Patient and family notified of of transfer: 03/25/24  Discharge Plan and Services Additional resources added to the After Visit Summary for  Follow Up In-house Referral: NA Discharge Planning Services: CM Consult Post Acute Care Choice: NA          DME Arranged: N/A DME Agency: NA       HH Arranged: PT HH Agency: Other - See comment [employer] [facility]) Date HH Agency Contacted: 03/25/24 Time HH Agency Contacted: 1322 Representative spoke with at [facility] Agency: [name]  Social Drivers of Health (SDOH) Interventions SDOH Screenings   Food Insecurity: No Food Insecurity (03/22/2024)  Housing: Low Risk  (03/22/2024)  Transportation Needs: Unmet  Transportation Needs (03/22/2024)  Utilities: Not At Risk (03/22/2024)  Alcohol  Screen: Low Risk  (06/15/2023)  Depression (PHQ2-9): Low Risk  (06/15/2023)  Financial Resource Strain: Low Risk  (06/15/2023)  Physical Activity: Inactive (06/15/2023)  Social Connections: Moderately Integrated (03/22/2024)  Stress: No Stress Concern Present (06/15/2023)  Tobacco Use: Medium Risk (03/25/2024)  Health Literacy: Adequate Health Literacy (06/15/2023)     Readmission Risk Interventions     No data to display          Signed: Heather Ford, MSW, LCSW Clinical Social Worker Inpatient Care Management 03/25/2024 2:09 PM

## 2024-03-25 NOTE — Plan of Care (Signed)
  Problem: Education: Goal: Knowledge of General Education information will improve Description: Including pain rating scale, medication(s)/side effects and non-pharmacologic comfort measures Outcome: Progressing   Problem: Health Behavior/Discharge Planning: Goal: Ability to manage health-related needs will improve Outcome: Progressing   Problem: Activity: Goal: Risk for activity intolerance will decrease Outcome: Progressing   Problem: Nutrition: Goal: Adequate nutrition will be maintained Outcome: Progressing   Problem: Elimination: Goal: Will not experience complications related to bowel motility Outcome: Progressing   Problem: Safety: Goal: Ability to remain free from injury will improve Outcome: Progressing

## 2024-03-25 NOTE — ED Provider Notes (Signed)
 Care of patient received from prior provider at 11:30 PM, please see their note for complete H/P and care plan.  Received handoff per ED course.  Clinical Course as of 03/26/24 [id number]  [place] Mar 25, 2024  2323 Discussed with son who notes that patient had a fall after she came home from the hospital.  Was reported to him that she had an abnormal oxygen saturation at 93% and was needing oxygen.  Also that she had an abnormal heartbeat.  He notes that she has some waxing and waning dementia/memory issues and from he describes it appears that she is at baseline.  He is comfortable with her going back to facility after discussion of workup thus far.  Awaiting UA. [TY]  2329 Stable HO TJY Just discharged from hospital Found down at [facility]. HX Dementia At baseline per family. (Different from EMS report) Just pending UA.  [CC]  Mon Mar 26, 2024  0113 UA looks consistent with UTI.  She is out of facility.  Trialing p.o. intake of Keflex .  She has a history of dizziness secondary to Keflex  from almost a decade ago.  Chart review shows she has been able to take it without problems.  Allergy removed. Patient was able to tolerate p.o. intake of antibiotic. [CC]  0114 Appears that patient can be safely managed in the outpatient setting. [CC]    Clinical Course User Index [CC] Jerral Meth, MD [TY] Neysa Caron PARAS, DO        Jerral Meth, MD 03/26/24 [phone]

## 2024-03-25 NOTE — Progress Notes (Signed)
 PROGRESS NOTE    Susan Ford  FMW:[id number] DOB: 1929/11/11 DOA: 03/22/2024 PCP: Charlett [name], MD    Brief Narrative:  89 y.o. female with medical history significant of osteoarthritis, hyperlipidemia, bilateral cataracts, choledocholithiasis, cholecystitis, history of ERCP, cholecystectomy, single episode transient postcholecystectomy A-fib, history of a flutter, history of anticoagulation, history of GI bleed, GERD, hereditary and idiopathic peripheral neuropathy, history of pulmonary embolism who was sent in via EMS from her nursing facility due to left hip pain, but also was complaining of constipation. He denied fever, chills, rhinorrhea, sore throat, wheezing or hemoptysis.  No chest pain, palpitations, diaphoresis, PND, orthopnea or pitting edema of the lower extremities.  No abdominal pain, nausea, emesis, diarrhea, constipation, melena or hematochezia.  No flank pain, dysuria, frequency or hematuria.  No polyuria, polydipsia, polyphagia or blurred vision.    Lab work:Urinalysis was hazy with ketones of 5 mg/dL, but otherwise negative. CBC was normal. CMP was unremarkable with the exception of a glucose of 109 mg/dL.   Imaging: Left hip x-ray with no fracture or acute finding.  No significant hip joint abnormality.  CT abdomen/pelvis with contrast showing significant rectal distention with stool with mild adjacent perirectal fat inflammation.  Findings consistent with proctitis in the proper clinical setting.  No other acute bowel abnormality.  No evidence of bowel obstruction.  Colonic diverticula without diverticulitis.  Aortic atherosclerosis.    Assessment & Plan:   Principal Problem:   Stercoral colitis Active Problems:   Neuropathy, peripheral   Hyperlipidemia   Dementia without behavioral disturbance (HCC)   Left lower quadrant abdominal pain     Left lower quadrant abdominal pain CT abdomen and pelvis shows significant rectal distention with stool with mild  adjacent perirectal fat inflammation. Findings consistent with proctitis in the proper clinical setting. No other acute bowel abnormality. No evidence of bowel obstruction. Colonic diverticula without diverticulitis. Aortic atherosclerosis. In the setting of: Stercoral colitis Status post smog enema MiraLAX  senna and Dulcolax She is medically stable to be discharged will check with memory care if they can take her today.   Active Problems:   Neuropathy, peripheral Continue gabapentin  300 mg p.o. daily as needed.     Hyperlipidemia Continue simvastatin  10 mg p.o. daily.     Dementia without behavioral disturbance (HCC) Fully oriented at this time. Supportive care as needed.   Estimated body mass index is 19.64 kg/m as calculated from the following:   Height as of this encounter: 5' 4 (1.626 m).   Weight as of this encounter: 51.9 kg.  DVT prophylaxis: lovenox  Code Status:full Family Communication:  Disposition Plan:  Status is: Observation  Consultants:none  Procedures:none Antimicrobials:none  Subjective: Nursing reports that she had BMs multiple Objective: Vitals:   03/24/24 0420 03/24/24 1340 03/24/24 1941 03/25/24 0525  BP: 123/65 124/73 (!) 146/85 117/79  Pulse: 95 88 74 (!) 102  Resp: 18 18 20 16   Temp: 98.2 F (36.8 C)  97.6 F (36.4 C)   TempSrc: Oral     SpO2: 96% 96% 99% 94%  Weight:      Height:        Intake/Output Summary (Last 24 hours) at 03/25/2024 1205 Last data filed at 03/24/2024 1400 Gross per 24 hour  Intake 120 ml  Output --  Net 120 ml   Filed Weights   03/22/24 1109  Weight: 51.9 kg    Examination:  General exam: Appears in nad Respiratory system: Clear to auscultation. Respiratory effort normal. Cardiovascular system:reg Gastrointestinal system: Abdomen  is nondistended, soft and nontender. No organomegaly or masses felt. Normal bowel sounds heard. Central nervous system: awake Extremities: no edema   Data Reviewed: I  have personally reviewed following labs and imaging studies  CBC: Recent Labs  Lab 03/20/24 1507 03/22/24 1221 03/23/24 0412 03/25/24 1030  WBC 8.6 9.7 8.6 9.3  NEUTROABS 5.8 7.2  --   --   HGB 12.2 12.7 11.7* 12.5  HCT 37.2 37.7 35.1* 38.0  MCV 93.9 92.0 92.1 93.8  PLT 257 293 259 301   Basic Metabolic Panel: Recent Labs  Lab 03/20/24 1507 03/22/24 1221 03/23/24 0412 03/25/24 1030  NA 135 136 134* 132*  K 4.4 4.2 3.8 4.0  CL 101 102 101 99  CO2 25 24 22 22   GLUCOSE 102* 109* 102* 107*  BUN 11 21 15 15   CREATININE 0.67 0.76 0.66 0.68  CALCIUM 9.2 9.3 8.7* 8.8*   GFR: Estimated Creatinine Clearance: 36.8 mL/min (by C-G formula based on SCr of 0.68 mg/dL). Liver Function Tests: Recent Labs  Lab 03/22/24 1221 03/23/24 0412 03/25/24 1030  AST 29 26 19   ALT 11 8 9   ALKPHOS 71 64 81  BILITOT 0.8 0.6 0.5  PROT 6.6 6.0* 6.2*  ALBUMIN 3.6 3.2* 3.3*   No results for input(s): LIPASE, AMYLASE in the last 168 hours. No results for input(s): AMMONIA in the last 168 hours. Coagulation Profile: No results for input(s): INR, PROTIME in the last 168 hours. Cardiac Enzymes: No results for input(s): CKTOTAL, CKMB, CKMBINDEX, TROPONINI in the last 168 hours. BNP (last 3 results) Recent Labs    05/11/23 1527  PROBNP 61.0   HbA1C: No results for input(s): HGBA1C in the last 72 hours. CBG: No results for input(s): GLUCAP in the last 168 hours. Lipid Profile: No results for input(s): CHOL, HDL, LDLCALC, TRIG, CHOLHDL, LDLDIRECT in the last 72 hours. Thyroid  Function Tests: No results for input(s): TSH, T4TOTAL, FREET4, T3FREE, THYROIDAB in the last 72 hours. Anemia Panel: No results for input(s): VITAMINB12, FOLATE, FERRITIN, TIBC, IRON, RETICCTPCT in the last 72 hours. Sepsis Labs: No results for input(s): PROCALCITON, LATICACIDVEN in the last 168 hours.  No results found for this or any previous visit (from  the past 240 hours).       Radiology Studies: DG Abd 1 View Result Date: 03/24/2024 EXAM: 1 VIEW XRAY OF THE ABDOMEN 03/24/2024 04:15:00 AM COMPARISON: Portable abdomen film yesterday 10:39 am. CLINICAL HISTORY: [id number] Abdominal pain [id number] Abdominal pain. FINDINGS: BOWEL: Nonobstructive bowel gas pattern. SOFT TISSUES: Cholecystectomy clips are present. Pelvic phleboliths are noted. No opaque urinary calculi. BONES: Mild levoscoliosis and advanced degenerative change of the lumbar spine. LUNG BASES: Fibrotic change in the lung bases with cardiomegaly. IMPRESSION: 1. No acute abdominal abnormality identified. Electronically signed by: Francis Quam MD 03/24/2024 05:39 AM EST RP Workstation: [id number]     Scheduled Meds:  bisacodyl   10 mg Oral Daily   enoxaparin  (LOVENOX ) injection  30 mg Subcutaneous Q24H   polyethylene glycol  17 g Oral BID   QUEtiapine  12.5 mg Oral QHS   senna  2 tablet Oral QHS   simvastatin   10 mg Oral Daily   Continuous Infusions:     LOS: 2 days    Almarie KANDICE Hoots, MD  03/25/2024, 12:05 PM

## 2024-03-25 NOTE — Evaluation (Signed)
 Physical Therapy Evaluation Patient Details Name: Susan Ford MRN: [id number] DOB: 31-Jan-1930 Today's Date: 03/25/2024  History of Present Illness  Pt admitted from [facility] (care level unclear) 2* c/o L hip pain (no noted injury on imaging) and dx wtih L lower quadrant pain in setting of stecoral colitis.  Pt with hx of dementia. PE, back surgery, ERCP, bil cataracts, and idiopathic peripheral neuropathy  Clinical Impression  Pt admitted as above and presenting with functional mobility limitations 2* generalized weakness, balance deficits and decreased activity tolerance.  This am, pt assisted to EOB and to standing but upon standing urgently needing BSC and assisted to same.  Level of care at [facility] unclear and dependent on same, pt may need follow up at SNF level prior to return to previous setting.        If plan is discharge home, recommend the following: A lot of help with walking and/or transfers;A lot of help with bathing/dressing/bathroom;Assistance with cooking/housework;Assist for transportation;Help with stairs or ramp for entrance   Can travel by private vehicle   No    Equipment Recommendations None recommended by PT  Recommendations for Other Services       Functional Status Assessment Patient has had a recent decline in their functional status and demonstrates the ability to make significant improvements in function in a reasonable and predictable amount of time.     Precautions / Restrictions Precautions Precautions: Fall Restrictions Weight Bearing Restrictions Per Provider Order: No      Mobility  Bed Mobility Overal bed mobility: Needs Assistance Bed Mobility: Supine to Sit     Supine to sit: Min assist, Mod assist     General bed mobility comments: Increased time, use of bed rail and assist to bring trunk to upright and complete rotation to EOB sitting    Transfers Overall transfer level: Needs assistance Equipment used: Rolling walker  (2 wheels) Transfers: Sit to/from Stand, Bed to chair/wheelchair/BSC Sit to Stand: Min assist   Step pivot transfers: Min assist, Mod assist       General transfer comment: cues for use of UEs to self assist; step pvt bed to [facility]    Ambulation/Gait               General Gait Details: To BSC only with onset of BM  Stairs            Wheelchair Mobility     Tilt Bed    Modified Rankin (Stroke Patients Only)       Balance Overall balance assessment: Needs assistance Sitting-balance support: No upper extremity supported, Feet supported Sitting balance-Leahy Scale: Good     Standing balance support: Bilateral upper extremity supported Standing balance-Leahy Scale: Poor                               Pertinent Vitals/Pain Pain Assessment Pain Assessment: Faces Faces Pain Scale: Hurts little more Pain Location: abdomen Pain Descriptors / Indicators: Aching, Grimacing Pain Intervention(s): Limited activity within patient's tolerance, Monitored during session    Home Living Family/patient expects to be discharged to:: Assisted living                 Home Equipment: [employer] (2 wheels);Cane - single point      Prior Function Prior Level of Function : Independent/Modified Independent;Needs assist             Mobility Comments: pt reports uses RW for amb in  facility ADLs Comments: pt reports ind with self care but also states facility assists as needed     Extremity/Trunk Assessment   Upper Extremity Assessment Upper Extremity Assessment: Generalized weakness    Lower Extremity Assessment Lower Extremity Assessment: Generalized weakness    Cervical / Trunk Assessment Cervical / Trunk Assessment: Kyphotic  Communication   Communication Communication: No apparent difficulties    Cognition Arousal: Alert Behavior During Therapy: Flat affect   PT - Cognitive impairments: No family/caregiver present to determine  baseline                       PT - Cognition Comments: Delayed processing noted Following commands: Impaired Following commands impaired: Follows one step commands with increased time     Cueing Cueing Techniques: Verbal cues, Gestural cues     General Comments      Exercises     Assessment/Plan    PT Assessment Patient needs continued PT services  PT Problem List Decreased strength;Decreased activity tolerance;Decreased mobility;Decreased balance;Decreased knowledge of use of DME;Pain       PT Treatment Interventions DME instruction;Gait training;Stair training;Functional mobility training;Therapeutic activities;Therapeutic exercise;Patient/family education;Balance training    PT Goals (Current goals can be found in the Care Plan section)  Acute Rehab PT Goals Patient Stated Goal: Get to the [facility] PT Goal Formulation: With patient Time For Goal Achievement: 04/08/24 Potential to Achieve Goals: Good    Frequency Min 2X/week     Co-evaluation               AM-PAC PT 6 Clicks Mobility  Outcome Measure Help needed turning from your back to your side while in a flat bed without using bedrails?: A Little Help needed moving from lying on your back to sitting on the side of a flat bed without using bedrails?: A Lot Help needed moving to and from a bed to a chair (including a wheelchair)?: A Lot Help needed standing up from a chair using your arms (e.g., wheelchair or bedside chair)?: A Little Help needed to walk in hospital room?: A Lot Help needed climbing 3-5 steps with a railing? : Total 6 Click Score: 13    End of Session Equipment Utilized During Treatment: Gait belt Activity Tolerance: Other (comment) (Need for BM) Patient left: Other (comment);with call bell/phone within reach [facility]) Nurse Communication: Mobility status PT Visit Diagnosis: Difficulty in walking, not elsewhere classified (R26.2);Muscle weakness (generalized) (M62.81)    Time:  9085-9073 PT Time Calculation (min) (ACUTE ONLY): 12 min   Charges:   PT Evaluation $PT Eval Low Complexity: 1 Low   PT General Charges $$ ACUTE PT VISIT: 1 Visit         [facility] PT Acute Rehabilitation Services Office [phone]   Kitara Hebb 03/25/2024, 12:12 PM

## 2024-03-25 NOTE — TOC Progression Note (Addendum)
 Transition of Care [facility]) - Progression Note    Patient Details  Name: Susan Ford MRN: [id number] Date of Birth: [birth year]  [facility] CM/SW Contact  Heather DELENA Saltness, LCSW Phone Number: 03/25/2024, 12:23 PM  Clinical Narrative:     ADDENDUM  12:50 PM - CSW spoke with pt's son, [name] [phone], via phone call to discuss PT's recommendation for SNF rehab. [name] requests pt return home to [facility] with [facility] PT/OT. Pt currently active with [facility]. TOC will continue to follow.  CSW spoke with intake staff, Luke, at [facility]. Pt currently resides in ILF, but hoping to move her into ALF side As of right now, pt would be returning to ILF. PT evaluated pt today, currently recommending SNF. CSW attempted to speak with pt's son, [name] [phone], via phone call to provide update. No answer, voicemail left. TOC will continue to follow.   Expected Discharge Plan: Assisted Living Barriers to Discharge: Continued Medical Work up  Expected Discharge Plan and Services In-house Referral: NA Discharge Planning Services: CM Consult Post Acute Care Choice: NA Living arrangements for the past 2 months: Independent Living Facility ([facility])                 DME Arranged: N/A DME Agency: NA       HH Arranged: NA HH Agency: NA         Social Drivers of Health (SDOH) Interventions SDOH Screenings   Food Insecurity: No Food Insecurity (03/22/2024)  Housing: Low Risk  (03/22/2024)  Transportation Needs: Unmet Transportation Needs (03/22/2024)  Utilities: Not At Risk (03/22/2024)  Alcohol  Screen: Low Risk  (06/15/2023)  Depression (PHQ2-9): Low Risk  (06/15/2023)  Financial Resource Strain: Low Risk  (06/15/2023)  Physical Activity: Inactive (06/15/2023)  Social Connections: Moderately Integrated (03/22/2024)  Stress: No Stress Concern Present (06/15/2023)  Tobacco Use: Medium Risk (03/25/2024)  Health Literacy: Adequate  Health Literacy (06/15/2023)    Readmission Risk Interventions     No data to display          Signed: Heather Saltness, MSW, LCSW Clinical Social Worker Inpatient Care Management 03/25/2024 12:25 PM

## 2024-03-25 NOTE — ED Triage Notes (Addendum)
 BIB EMS from [facility], for unwitnessed fall. No loss of consciousness, no complaints of any upper pain, however, complaining of left hip pain-no shortening nor rotations, good pulses bilateral. Normally on room air, placed on 2L due to shortness of breath, was able to answer questions initially.  Staff noticed indifference to mental baseline.  EMS noticed gradual difference cognitively within the hr. No dx of mental issues. Possible UTI. No blood thinner.  Patient is DNR.  HR; 102 palpated (feel irregular) no heart conditions Bp: 122/80 94% 2L Cbg 142

## 2024-03-25 NOTE — Plan of Care (Signed)

## 2024-03-26 ENCOUNTER — Encounter (HOSPITAL_COMMUNITY): Payer: Self-pay

## 2024-03-26 ENCOUNTER — Emergency Department (HOSPITAL_COMMUNITY)

## 2024-03-26 ENCOUNTER — Observation Stay (HOSPITAL_COMMUNITY)
Admission: EM | Admit: 2024-03-26 | Discharge: 2024-03-29 | Disposition: A | Discharge status: Skilled Nursing Facility | Source: Skilled Nursing Facility | Attending: Internal Medicine | Admitting: Internal Medicine

## 2024-03-26 DIAGNOSIS — R531 Weakness: Secondary | ICD-10-CM | POA: Insufficient documentation

## 2024-03-26 DIAGNOSIS — G629 Polyneuropathy, unspecified: Secondary | ICD-10-CM | POA: Insufficient documentation

## 2024-03-26 DIAGNOSIS — N390 Urinary tract infection, site not specified: Principal | ICD-10-CM | POA: Diagnosis present

## 2024-03-26 DIAGNOSIS — K529 Noninfective gastroenteritis and colitis, unspecified: Secondary | ICD-10-CM | POA: Diagnosis not present

## 2024-03-26 DIAGNOSIS — E785 Hyperlipidemia, unspecified: Secondary | ICD-10-CM | POA: Diagnosis present

## 2024-03-26 DIAGNOSIS — K5750 Diverticulosis of both small and large intestine without perforation or abscess without bleeding: Secondary | ICD-10-CM | POA: Diagnosis not present

## 2024-03-26 DIAGNOSIS — K5792 Diverticulitis of intestine, part unspecified, without perforation or abscess without bleeding: Secondary | ICD-10-CM | POA: Diagnosis not present

## 2024-03-26 DIAGNOSIS — Z87891 Personal history of nicotine dependence: Secondary | ICD-10-CM | POA: Insufficient documentation

## 2024-03-26 DIAGNOSIS — F0390 Unspecified dementia without behavioral disturbance: Secondary | ICD-10-CM | POA: Diagnosis present

## 2024-03-26 DIAGNOSIS — E441 Mild protein-calorie malnutrition: Secondary | ICD-10-CM | POA: Diagnosis present

## 2024-03-26 DIAGNOSIS — K449 Diaphragmatic hernia without obstruction or gangrene: Secondary | ICD-10-CM | POA: Diagnosis not present

## 2024-03-26 DIAGNOSIS — R1084 Generalized abdominal pain: Secondary | ICD-10-CM | POA: Diagnosis not present

## 2024-03-26 LAB — URINALYSIS, ROUTINE W REFLEX MICROSCOPIC
Bilirubin Urine: NEGATIVE
Glucose, UA: NEGATIVE mg/dL
Hgb urine dipstick: NEGATIVE
Ketones, ur: 5 mg/dL — AB
Nitrite: POSITIVE — AB
Protein, ur: NEGATIVE mg/dL
Specific Gravity, Urine: 1.021 (ref 1.005–1.030)
WBC, UA: >50 WBC/hpf (ref 0–5)
pH: 5 (ref 5.0–8.0)

## 2024-03-26 LAB — URINALYSIS, W/ REFLEX TO CULTURE (INFECTION SUSPECTED)
Bilirubin Urine: NEGATIVE
Glucose, UA: NEGATIVE mg/dL
Hgb urine dipstick: NEGATIVE
Ketones, ur: 5 mg/dL — AB
Nitrite: POSITIVE — AB
Protein, ur: NEGATIVE mg/dL
Specific Gravity, Urine: 1.018 (ref 1.005–1.030)
WBC, UA: >50 WBC/hpf (ref 0–5)
pH: 6 (ref 5.0–8.0)

## 2024-03-26 LAB — CBC WITH DIFFERENTIAL/PLATELET
Abs Immature Granulocytes: 0.05 K/uL (ref 0.00–0.07)
Basophils Absolute: 0.1 K/uL (ref 0.0–0.1)
Basophils Relative: 1 %
Eosinophils Absolute: 0.4 K/uL (ref 0.0–0.5)
Eosinophils Relative: 4 %
HCT: 39.7 % (ref 36.0–46.0)
Hemoglobin: 12.5 g/dL (ref 12.0–15.0)
Immature Granulocytes: 1 %
Lymphocytes Relative: 14 %
Lymphs Abs: 1.2 K/uL (ref 0.7–4.0)
MCH: 30.7 pg (ref 26.0–34.0)
MCHC: 31.5 g/dL (ref 30.0–36.0)
MCV: 97.5 fL (ref 80.0–100.0)
Monocytes Absolute: 0.8 K/uL (ref 0.1–1.0)
Monocytes Relative: 9 %
Neutro Abs: 6.2 K/uL (ref 1.7–7.7)
Neutrophils Relative %: 71 %
Platelets: 261 K/uL (ref 150–400)
RBC: 4.07 MIL/uL (ref 3.87–5.11)
RDW: 12 % (ref 11.5–15.5)
WBC: 8.7 K/uL (ref 4.0–10.5)
nRBC: 0 % (ref 0.0–0.2)

## 2024-03-26 LAB — COMPREHENSIVE METABOLIC PANEL WITH GFR
ALT: 7 U/L (ref 0–44)
AST: 17 U/L (ref 15–41)
Albumin: 3.4 g/dL — ABNORMAL LOW (ref 3.5–5.0)
Alkaline Phosphatase: 76 U/L (ref 38–126)
Anion gap: 10 (ref 5–15)
BUN: 15 mg/dL (ref 8–23)
CO2: 22 mmol/L (ref 22–32)
Calcium: 8.9 mg/dL (ref 8.9–10.3)
Chloride: 101 mmol/L (ref 98–111)
Creatinine, Ser: 0.62 mg/dL (ref 0.44–1.00)
GFR, Estimated: >60 mL/min (ref >60)
Glucose, Bld: 106 mg/dL — ABNORMAL HIGH (ref 70–99)
Potassium: 4.1 mmol/L (ref 3.5–5.1)
Sodium: 133 mmol/L — ABNORMAL LOW (ref 135–145)
Total Bilirubin: 0.7 mg/dL (ref 0.0–1.2)
Total Protein: 6.4 g/dL — ABNORMAL LOW (ref 6.5–8.1)

## 2024-03-26 LAB — CBG MONITORING, ED: Glucose-Capillary: 111 mg/dL — ABNORMAL HIGH (ref 70–99)

## 2024-03-26 LAB — LIPASE, BLOOD: Lipase: 25 U/L (ref 11–51)

## 2024-03-26 MED ORDER — ONDANSETRON HCL 4 MG PO TABS: 4.0000 mg | ORAL_TABLET | Freq: Four times a day (QID) | ORAL | Status: DC | PRN

## 2024-03-26 MED ORDER — ACETAMINOPHEN 325 MG PO TABS
650.0000 mg | ORAL_TABLET | Freq: Four times a day (QID) | ORAL | Status: DC | PRN
  Administered 2024-03-26 – 2024-03-29 (×4): 650 mg via ORAL
  Filled 2024-03-26 (×4): qty 2

## 2024-03-26 MED ORDER — CEPHALEXIN 500 MG PO CAPS
500.0000 mg | ORAL_CAPSULE | Freq: Once | ORAL | Status: AC
  Administered 2024-03-26: 500 mg via ORAL
  Filled 2024-03-26: qty 1

## 2024-03-26 MED ORDER — SODIUM CHLORIDE 0.9 % IV SOLN
1.0000 g | INTRAVENOUS | Status: DC
  Administered 2024-03-27: 1 g via INTRAVENOUS
  Filled 2024-03-26: qty 10

## 2024-03-26 MED ORDER — CEPHALEXIN 500 MG PO CAPS: 500.0000 mg | ORAL_CAPSULE | Freq: Two times a day (BID) | ORAL | 0 refills | Status: DC

## 2024-03-26 MED ORDER — HALOPERIDOL LACTATE 5 MG/ML IJ SOLN
2.0000 mg | Freq: Four times a day (QID) | INTRAMUSCULAR | Status: DC | PRN
  Administered 2024-03-26: 2 mg via INTRAVENOUS
  Filled 2024-03-26: qty 1

## 2024-03-26 MED ORDER — ENOXAPARIN SODIUM 40 MG/0.4ML IJ SOSY
40.0000 mg | PREFILLED_SYRINGE | INTRAMUSCULAR | Status: DC
  Administered 2024-03-26: 40 mg via SUBCUTANEOUS
  Filled 2024-03-26 (×3): qty 0.4

## 2024-03-26 MED ORDER — ACETAMINOPHEN 650 MG RE SUPP: 650.0000 mg | Freq: Four times a day (QID) | RECTAL | Status: DC | PRN

## 2024-03-26 MED ORDER — ONDANSETRON HCL 4 MG/2ML IJ SOLN: 4.0000 mg | Freq: Four times a day (QID) | INTRAMUSCULAR | Status: DC | PRN

## 2024-03-26 MED ORDER — SODIUM CHLORIDE 0.9 % IV SOLN
1.0000 g | Freq: Once | INTRAVENOUS | Status: AC
  Administered 2024-03-26: 1 g via INTRAVENOUS
  Filled 2024-03-26: qty 10

## 2024-03-26 NOTE — Plan of Care (Signed)
   Problem: Nutrition: Goal: Adequate nutrition will be maintained Outcome: Progressing   Problem: Coping: Goal: Level of anxiety will decrease Outcome: Progressing   Problem: Elimination: Goal: Will not experience complications related to bowel motility Outcome: Progressing Goal: Will not experience complications related to urinary retention Outcome: Progressing

## 2024-03-26 NOTE — ED Notes (Signed)
 Phlebotomy called to obtain light green top from patient.

## 2024-03-26 NOTE — ED Notes (Signed)
 [facility] contacted for transport

## 2024-03-26 NOTE — H&P (Addendum)
 History and Physical    Patient: Susan Ford DOB: 1929/05/05 DOA: 03/26/2024 DOS: the patient was seen and examined on 03/26/2024 PCP: Charlett [name], MD  Patient coming from: Home  Chief Complaint:  Chief Complaint  Patient presents with   Abdominal Pain   HPI: Susan Ford is a 89 y.o. female with medical history significant of osteoarthritis, hyperlipidemia, bilateral cataracts, choledocholithiasis, cholecystitis, history of ERCP, cholecystectomy, single episode transient postcholecystectomy A-fib, history of a flutter, history of anticoagulation, history of GI bleed, GERD, hereditary and idiopathic peripheral neuropathy, history of pulmonary embolism who was recently admitted and discharged for 3 days due to stercoral colitis who was sent by her facility again to the emergency department due to abdominal pain.  Denied any pain at this time.  Seems to be more confused than a few days ago.  Lab work: CBC was normal.  Unremarkable lipase.  CMP showed sodium 132 mmol/L, the rest of the electrolytes and renal function were normal.  Glucose 106 mg/dL, total protein 6.4 and albumin 3.4 g/dL with the remaining hepatic functions within expected range.  Imaging: CT renal stone study with any acute intra-abdominal or intrapelvic abnormality.  Duodenal and total colonic diverticulosis without inflammatory changes.  Unchanged chronic interstitial lung disease at the bases.  Aortic atherosclerosis.  ED course: Initial vital signs were temperature 97.9 F, pulse 80, respiration 20, BP 137/71 mmHg and O2 sat 98% on room air.  The patient received ceftriaxone  1 g IVPB.   Review of Systems: As mentioned in the history of present illness. All other systems reviewed and are negative. Past Medical History:  Diagnosis Date   Arthritis    Cataract 04/26/2005   bilateral cat ext    Cholecystitis    Common bile duct stone 03/22/2024   Complication of anesthesia 02/21/2014    atrial fib after cholecystectomy, lasted a few hours, none since   Dementia without behavioral disturbance (HCC) 10/03/2017   Dysrhythmia 02/21/2014   after cholecystectomy had atrial fib for a few hours, none since   GERD (gastroesophageal reflux disease)    Had endoscopy no history of Barrett's   Hx of nonmelanoma skin cancer    Hyperlipidemia 10/23/2013   On simva no   No se of med      Pulmonary embolism (HCC) 12/28/2022   Lll no strain  9 2024 hosp      Unspecified hereditary and idiopathic peripheral neuropathy    Past Surgical History:  Procedure Laterality Date   ABDOMINAL HYSTERECTOMY  15 yrs ago   complete   BIOPSY  07/09/2022   Procedure: BIOPSY;  Surgeon: Elicia Claw, MD;  Location: [facility];  Service: Gastroenterology;;   BIOPSY  04/13/2023   Procedure: BIOPSY;  Surgeon: Rosalie Kitchens, MD;  Location: [facility];  Service: Gastroenterology;;   BREAST CYST ASPIRATION     CHOLECYSTECTOMY N/A 02/21/2014   Procedure: LAPAROSCOPIC CHOLECYSTECTOMY WITH INTRAOPERATIVE CHOLANGIOGRAM;  Surgeon: Gordy Pina, MD;  Location: [facility];  Service: General;  Laterality: N/A;   ENDOSCOPIC RETROGRADE CHOLANGIOPANCREATOGRAPHY (ERCP) WITH PROPOFOL  N/A 05/29/2014   Procedure: ENDOSCOPIC RETROGRADE CHOLANGIOPANCREATOGRAPHY (ERCP) WITH PROPOFOL ;  Surgeon: Elsie Cree, MD;  Location: [facility];  Service: Endoscopy;  Laterality: N/A;   ERCP N/A 02/24/2014   Procedure: ENDOSCOPIC RETROGRADE CHOLANGIOPANCREATOGRAPHY (ERCP);  Surgeon: Elsie Cree, MD;  Location: [facility];  Service: Endoscopy;  Laterality: N/A;   ESOPHAGOGASTRODUODENOSCOPY (EGD) WITH PROPOFOL  N/A 07/09/2022   Procedure: ESOPHAGOGASTRODUODENOSCOPY (EGD) WITH PROPOFOL ;  Surgeon: Elicia Claw, MD;  Location: [facility];  Service: Gastroenterology;  Laterality: N/A;   ESOPHAGOGASTRODUODENOSCOPY (EGD) WITH PROPOFOL  N/A 04/13/2023   Procedure: ESOPHAGOGASTRODUODENOSCOPY (EGD) WITH PROPOFOL ;  Surgeon: Rosalie Kitchens, MD;   Location: [facility];  Service: Gastroenterology;  Laterality: N/A;   EYE SURGERY Bilateral 2007   both eyes cataracts with lens replacments   LUMBAR LAMINECTOMY/DECOMPRESSION MICRODISCECTOMY  02/01/2012   Procedure: LUMBAR LAMINECTOMY/DECOMPRESSION MICRODISCECTOMY 2 LEVELS;  Surgeon: Victory Gens, MD;  Location: [facility];  Service: Neurosurgery;  Laterality: Left;  Left Lumbar Four-Five Lumbar Five-Sacral One Laminectomies/Foraminotomies/Microscope   SPYGLASS CHOLANGIOSCOPY N/A 05/29/2014   Procedure: SPYGLASS CHOLANGIOSCOPY;  Surgeon: Elsie Cree, MD;  Location: [facility];  Service: Endoscopy;  Laterality: N/A;   TONSILLECTOMY  age 3   Social History:  reports that she quit smoking about 60 years ago. Her smoking use included cigarettes. She started smoking about 67 years ago. She has a 1.8 pack-year smoking history. She has never used smokeless tobacco. She reports current alcohol  use of about 7.0 standard drinks of alcohol  per week. She reports that she does not use drugs.  Allergies  Allergen Reactions   Lyrica [Pregabalin] Other (See Comments)    High Fever    Oxytetracycline Other (See Comments)    High Fever caused by terramycin    Memantine  Rash    Family History  Problem Relation Age of Onset   Neurodegenerative disease Mother        Shy Drager died at 60   Alcohol  abuse Other    Heart disease Other     Prior to Admission medications   Medication Sig Start Date End Date Taking? Authorizing Provider  acetaminophen  (TYLENOL ) 325 MG tablet Take 650 mg by mouth every 6 (six) hours as needed for moderate pain (pain score 4-6) or mild pain (pain score 1-3).    [provider]  Ascorbic Acid  (VITAMIN C ) 1000 MG tablet Take 1,000 mg by mouth daily.    [provider]  bisacodyl  (DULCOLAX) 5 MG EC tablet Take 2 tablets (10 mg total) by mouth daily. 03/26/24   Will Almarie MATSU, MD  cephALEXin  (KEFLEX ) 500 MG capsule Take 1 capsule (500 mg total)  by mouth 2 (two) times daily. 03/26/24   Jerral Meth, MD  gabapentin  (NEURONTIN ) 300 MG capsule TAKE 1 CAPSULE BY MOUTH EVERYDAY AT BEDTIME Patient taking differently: Take 300 mg by mouth daily as needed (FOR PAIN). 11/14/23   Panosh, Wanda K, MD  Multiple Vitamin (MULTIVITAMIN) tablet Take 1 tablet by mouth daily after lunch.    [provider]  ondansetron  (ZOFRAN ) 4 MG tablet Take 1 tablet (4 mg total) by mouth every 6 (six) hours as needed for nausea. 03/25/24   Will Almarie MATSU, MD  polyethylene glycol (MIRALAX  / GLYCOLAX ) 17 g packet Take 17 g by mouth 2 (two) times daily. 03/25/24   Will Almarie MATSU, MD  PREVAGEN EXTRA STRENGTH 20 MG CAPS Take 20 mg by mouth daily.    [provider]  senna (SENOKOT) 8.6 MG TABS tablet Take 2 tablets (17.2 mg total) by mouth at bedtime. 03/25/24   Will Almarie MATSU, MD  simvastatin  (ZOCOR ) 10 MG tablet TAKE 1 TABLET BY MOUTH EVERY DAY 02/15/24   Panosh, [name], MD    Physical Exam: Vitals:   03/26/24 1024 03/26/24 1200 03/26/24 1303  BP: 137/71 (!) 150/70 (!) 151/80  Pulse: 80 78 71  Resp: 20 (!) 21 13  Temp: 97.9 F (36.6 C)    TempSrc: Oral    SpO2: 98%  99% 97%   Physical Exam Vitals and nursing note reviewed.  Constitutional:      General: She is awake. She is not in acute distress.    Appearance: She is ill-appearing.  HENT:     Head: Normocephalic.     Nose: No rhinorrhea.     Mouth/Throat:     Mouth: Mucous membranes are moist.  Eyes:     General: No scleral icterus.    Pupils: Pupils are equal, round, and reactive to light.  Neck:     Vascular: No JVD.  Cardiovascular:     Rate and Rhythm: Normal rate and regular rhythm.     Heart sounds: S1 normal and S2 normal.  Pulmonary:     Effort: Pulmonary effort is normal.     Breath sounds: Normal breath sounds. No wheezing, rhonchi or rales.  Abdominal:     General: Bowel sounds are normal. There is no distension.     Palpations: Abdomen is soft.      Tenderness: There is no abdominal tenderness. There is no right CVA tenderness or left CVA tenderness.  Musculoskeletal:     Cervical back: Neck supple.     Right lower leg: No edema.     Left lower leg: No edema.  Skin:    General: Skin is warm and dry.  Neurological:     Mental Status: She is alert.  Psychiatric:        Mood and Affect: Mood normal.        Behavior: Behavior normal. Behavior is cooperative.     Data Reviewed:  Results are pending, will review when available.  EKG: Vent. rate 70 BPM  PR interval 190 ms QRS duration 98 ms  QT/QTcB 413/446 ms  P-R-T axes 66 -21 37  Sinus rhythm  Atrial premature complexes  Borderline left axis deviation  RSR' in V1 or V2, probably normal variant  Assessment and Plan: Principal Problem:   Acute UTI (urinary tract infection) Admit to telemetry/inpatient. Continue ceftriaxone  1 g IVPB daily. Follow-up urine culture and sensitivity. Follow-up CBC and chemistry in the morning.  Active Problems:   Mild protein malnutrition In the setting of recent colitis and UTI. May benefit from protein supplementation. Consider nutritional services evaluation. Follow-up albumin level.    Neuropathy, peripheral Continue gabapentin  300 mg p.o. daily as needed.     Hyperlipidemia Continue simvastatin  10 mg p.o. daily.     Dementia without behavioral disturbance (HCC) Fully oriented at this time. Supportive care as needed.    Advance Care Planning:   Code Status: Limited: Do not attempt resuscitation (DNR) -DNR-LIMITED -Do Not Intubate/DNI    Consults:   Family Communication:   Severity of Illness: The appropriate patient status for this patient is INPATIENT. Inpatient status is judged to be reasonable and necessary in order to provide the required intensity of service to ensure the patient's safety. The patient's presenting symptoms, physical exam findings, and initial radiographic and laboratory data in the context of  their chronic comorbidities is felt to place them at high risk for further clinical deterioration. Furthermore, it is not anticipated that the patient will be medically stable for discharge from the hospital within 2 midnights of admission.   * I certify that at the point of admission it is my clinical judgment that the patient will require inpatient hospital care spanning beyond 2 midnights from the point of admission due to high intensity of service, high risk for further deterioration and high frequency of surveillance required.*  Author: Alm Dorn Castor, MD 03/26/2024 2:30 PM  For on call review [email or web address].

## 2024-03-26 NOTE — ED Triage Notes (Signed)
 Pt BIB EMS from [facility] due to generalized abdominal pain in lower quadrant for the last few days. No n/v, no diarrhea. Pt last BM was yesterday. Pt was here last night for same concerns. DNR at bedside. Hx of Dementia  HR 80 SpO2 97% RA BP 140/60

## 2024-03-26 NOTE — ED Provider Notes (Signed)
 [place] EMERGENCY DEPARTMENT AT [facility] Provider Note   CSN: [id number] Arrival date & time: 03/26/24  1010     History {Add pertinent medical, surgical, social history, OB history to HPI:1} No chief complaint on file.   GALIT URICH is a 89 y.o. female with PMH as listed below who presents with ***.   Admitted from 03/22/2024 to 03/25/2024, discharged yesterday, from stercoral colitis.  Was seen in the ER last night as well after being found down at her facility.  Was at her baseline mental status per family at bedside, has a history of waxing and waning dementia/memory issues.  UA looks consistent with UTI and treated with antibiotics.  Past Medical History:  Diagnosis Date  . Arthritis   . Cataract 04/26/2005   bilateral cat ext   . Cholecystitis   . Common bile duct stone 03/22/2024  . Complication of anesthesia 02/21/2014   atrial fib after cholecystectomy, lasted a few hours, none since  . Dementia without behavioral disturbance (HCC) 10/03/2017  . Dysrhythmia 02/21/2014   after cholecystectomy had atrial fib for a few hours, none since  . GERD (gastroesophageal reflux disease)    Had endoscopy no history of Barrett's  . Hx of nonmelanoma skin cancer   . Hyperlipidemia 10/23/2013   On simva no   No se of med     . Pulmonary embolism (HCC) 12/28/2022   Lll no strain  9 2024 hosp     . Unspecified hereditary and idiopathic peripheral neuropathy        Home Medications Prior to Admission medications   Medication Sig Start Date End Date Taking? Authorizing Provider  acetaminophen  (TYLENOL ) 325 MG tablet Take 650 mg by mouth every 6 (six) hours as needed for moderate pain (pain score 4-6) or mild pain (pain score 1-3).    [provider]  Ascorbic Acid  (VITAMIN C ) 1000 MG tablet Take 1,000 mg by mouth daily.    [provider]  bisacodyl  (DULCOLAX) 5 MG EC tablet Take 2 tablets (10 mg total) by mouth daily. 03/26/24   Will Almarie MATSU, MD  cephALEXin  (KEFLEX ) 500 MG capsule Take 1 capsule (500 mg total) by mouth 2 (two) times daily. 03/26/24   Jerral Meth, MD  gabapentin  (NEURONTIN ) 300 MG capsule TAKE 1 CAPSULE BY MOUTH EVERYDAY AT BEDTIME Patient taking differently: Take 300 mg by mouth daily as needed (FOR PAIN). 11/14/23   Panosh, Wanda K, MD  Multiple Vitamin (MULTIVITAMIN) tablet Take 1 tablet by mouth daily after lunch.    [provider]  ondansetron  (ZOFRAN ) 4 MG tablet Take 1 tablet (4 mg total) by mouth every 6 (six) hours as needed for nausea. 03/25/24   Will Almarie MATSU, MD  polyethylene glycol (MIRALAX  / GLYCOLAX ) 17 g packet Take 17 g by mouth 2 (two) times daily. 03/25/24   Will Almarie MATSU, MD  PREVAGEN EXTRA STRENGTH 20 MG CAPS Take 20 mg by mouth daily.    [provider]  senna (SENOKOT) 8.6 MG TABS tablet Take 2 tablets (17.2 mg total) by mouth at bedtime. 03/25/24   Will Almarie MATSU, MD  simvastatin  (ZOCOR ) 10 MG tablet TAKE 1 TABLET BY MOUTH EVERY DAY 02/15/24   Panosh, Wanda K, MD      Allergies    Lyrica [pregabalin], Oxytetracycline, and Memantine     Review of Systems   Review of Systems A 10 point review of systems was performed and is negative unless otherwise reported in HPI.  Physical Exam Updated Vital Signs There were no vitals taken for this visit. Physical Exam General: Normal appearing {Desc; female/female:11659}, lying in bed.  HEENT: PERRLA, Sclera anicteric, MMM, trachea midline.  Cardiology: RRR, no murmurs/rubs/gallops. BL radial and DP pulses equal bilaterally.  Resp: Normal respiratory rate and effort. CTAB, no wheezes, rhonchi, crackles.  Abd: Soft, non-tender, non-distended. No rebound tenderness or guarding.  GU: Deferred. MSK: No peripheral edema or signs of trauma. Extremities without deformity or TTP. No cyanosis or clubbing. Skin: warm, dry. No rashes or lesions. Back: No CVA tenderness Neuro: A&Ox4, CNs II-XII grossly  intact. MAEs. Sensation grossly intact.  Psych: Normal mood and affect.   ED Results / Procedures / Treatments   Labs (all labs ordered are listed, but only abnormal results are displayed) Labs Reviewed - No data to display  EKG None  Radiology CT Cervical Spine Wo Contrast Result Date: 03/25/2024 EXAM: CT CERVICAL SPINE WITHOUT CONTRAST 03/25/2024 10:36:00 PM TECHNIQUE: CT of the cervical spine was performed without the administration of intravenous contrast. Multiplanar reformatted images are provided for review. Automated exposure control, iterative reconstruction, and/or weight based adjustment of the mA/kV was utilized to reduce the radiation dose to as low as reasonably achievable. COMPARISON: None available. CLINICAL HISTORY: Neck trauma (Age >= 65y) FINDINGS: CERVICAL SPINE: BONES AND ALIGNMENT: No acute fracture or traumatic malalignment. DEGENERATIVE CHANGES: Multilevel spondylosis, disc space height loss, and degenerative endplate changes greatest at C5-C6 and C6-C7. Multilevel advanced facet arthropathy bilaterally. No severe spinal canal narrowing. SOFT TISSUES: No prevertebral soft tissue swelling. IMPRESSION: 1. No acute abnormality of the cervical spine. Electronically signed by: Norman Gatlin MD 03/25/2024 10:55 PM EST RP Workstation: [id number]   CT Head Wo Contrast Result Date: 03/25/2024 EXAM: CT HEAD WITHOUT CONTRAST 03/25/2024 10:36:00 PM TECHNIQUE: CT of the head was performed without the administration of intravenous contrast. Automated exposure control, iterative reconstruction, and/or weight based adjustment of the mA/kV was utilized to reduce the radiation dose to as low as reasonably achievable. COMPARISON: Comparison to study dated 02/08/2024. CLINICAL HISTORY: Mental status change, unknown cause. FINDINGS: BRAIN AND VENTRICLES: No acute hemorrhage. No evidence of acute infarct. No hydrocephalus. No extra-axial collection. No mass effect or midline shift. Chronic  microvascular ischemia and generalized atrophy. ORBITS: No acute abnormality. SINUSES: No acute abnormality. Debris in the ethmoid air cells and sphenoid sinuses. SOFT TISSUES AND SKULL: No acute soft tissue abnormality. No skull fracture. IMPRESSION: 1. No acute intracranial abnormality. Electronically signed by: Norman Gatlin MD 03/25/2024 10:52 PM EST RP Workstation: [id number]   DG Pelvis 1-2 Views Result Date: 03/25/2024 EXAM: 1 OR 2 VIEW(S) XRAY OF THE PELVIS 03/25/2024 10:22:07 PM COMPARISON: None available. CLINICAL HISTORY: [id number] Fall [id number] FINDINGS: BONES AND JOINTS: Spondylitic changes in the visualized lower lumbar spine. No focal osseous lesion. No no acute fracture or joint dislocation. SOFT TISSUES: Atherosclerotic vascular calcifications. IMPRESSION: 1. No acute fracture. Electronically signed by: Norman Gatlin MD 03/25/2024 10:40 PM EST RP Workstation: [id number]   DG Chest 2 View Result Date: 03/25/2024 EXAM: 2 VIEW(S) XRAY OF THE CHEST 03/25/2024 10:22:07 PM COMPARISON: 02/08/2024 CLINICAL HISTORY: AMS FINDINGS: LUNGS AND PLEURA: No focal pulmonary opacity. No pleural effusion. No pneumothorax. HEART AND MEDIASTINUM: Aortic atherosclerosis and tortuosity. No acute abnormality of the cardiac and mediastinal silhouettes. BONES AND SOFT TISSUES: Thoracic degenerative disc disease. IMPRESSION: 1. No acute cardiopulmonary findings. Electronically signed by: Norman Gatlin MD 03/25/2024 10:39 PM EST RP Workstation: [id number]    Procedures Procedures  {Document cardiac monitor, telemetry assessment  procedure when appropriate:1}  Medications Ordered in ED Medications - No data to display  ED Course/ Medical Decision Making/ A&P                          Medical Decision Making   This patient presents to the ED for concern of ***, this involves an extensive number of treatment options, and is a complaint that carries with it a high risk of complications and morbidity.  I  considered the following differential and admission for this acute, potentially life threatening condition.   MDM:    ***     Labs: I Ordered, and personally interpreted labs.  The pertinent results include:  ***  Imaging Studies ordered: I ordered imaging studies including *** I independently visualized and interpreted imaging. I agree with the radiologist interpretation  Additional history obtained from ***.  External records from outside source obtained and reviewed including ***  Cardiac Monitoring: .The patient was maintained on a cardiac monitor.  I personally viewed and interpreted the cardiac monitored which showed an underlying rhythm of: ***  Reevaluation: After the interventions noted above, I reevaluated the patient and found that they have :{resolved/improved/worsened:23923::improved}  Social Determinants of Health: .***  Disposition:  ***  Co morbidities that complicate the patient evaluation . Past Medical History:  Diagnosis Date  . Arthritis   . Cataract 04/26/2005   bilateral cat ext   . Cholecystitis   . Common bile duct stone 03/22/2024  . Complication of anesthesia 02/21/2014   atrial fib after cholecystectomy, lasted a few hours, none since  . Dementia without behavioral disturbance (HCC) 10/03/2017  . Dysrhythmia 02/21/2014   after cholecystectomy had atrial fib for a few hours, none since  . GERD (gastroesophageal reflux disease)    Had endoscopy no history of Barrett's  . Hx of nonmelanoma skin cancer   . Hyperlipidemia 10/23/2013   On simva no   No se of med     . Pulmonary embolism (HCC) 12/28/2022   Lll no strain  9 2024 hosp     . Unspecified hereditary and idiopathic peripheral neuropathy      Medicines No orders of the defined types were placed in this encounter.   I have reviewed the patients home medicines and have made adjustments as needed  Problem List / ED Course: Problem List Items Addressed This Visit   None         {Document critical care time when appropriate:1} {Document review of labs and clinical decision tools ie heart score, Chads2Vasc2 etc:1}  {Document your independent review of radiology images, and any outside records:1} {Document your discussion with family members, caretakers, and with consultants:1} {Document social determinants of health affecting pt's care:1} {Document your decision making why or why not admission, treatments were needed:1}  This note was created using dictation software, which may contain spelling or grammatical errors.

## 2024-03-27 ENCOUNTER — Telehealth: Payer: Self-pay

## 2024-03-27 DIAGNOSIS — N390 Urinary tract infection, site not specified: Secondary | ICD-10-CM | POA: Diagnosis not present

## 2024-03-27 LAB — COMPREHENSIVE METABOLIC PANEL WITH GFR
ALT: 6 U/L (ref 0–44)
AST: 15 U/L (ref 15–41)
Albumin: 3.2 g/dL — ABNORMAL LOW (ref 3.5–5.0)
Alkaline Phosphatase: 72 U/L (ref 38–126)
Anion gap: 9 (ref 5–15)
BUN: 15 mg/dL (ref 8–23)
CO2: 23 mmol/L (ref 22–32)
Calcium: 8.8 mg/dL — ABNORMAL LOW (ref 8.9–10.3)
Chloride: 101 mmol/L (ref 98–111)
Creatinine, Ser: 0.61 mg/dL (ref 0.44–1.00)
GFR, Estimated: >60 mL/min (ref >60)
Glucose, Bld: 109 mg/dL — ABNORMAL HIGH (ref 70–99)
Potassium: 4 mmol/L (ref 3.5–5.1)
Sodium: 134 mmol/L — ABNORMAL LOW (ref 135–145)
Total Bilirubin: 0.6 mg/dL (ref 0.0–1.2)
Total Protein: 6.2 g/dL — ABNORMAL LOW (ref 6.5–8.1)

## 2024-03-27 LAB — CBC
HCT: 35.5 % — ABNORMAL LOW (ref 36.0–46.0)
Hemoglobin: 11.8 g/dL — ABNORMAL LOW (ref 12.0–15.0)
MCH: 30.9 pg (ref 26.0–34.0)
MCHC: 33.2 g/dL (ref 30.0–36.0)
MCV: 92.9 fL (ref 80.0–100.0)
Platelets: 282 K/uL (ref 150–400)
RBC: 3.82 MIL/uL — ABNORMAL LOW (ref 3.87–5.11)
RDW: 12.1 % (ref 11.5–15.5)
WBC: 7.8 K/uL (ref 4.0–10.5)
nRBC: 0 % (ref 0.0–0.2)

## 2024-03-27 MED ORDER — GABAPENTIN 300 MG PO CAPS
300.0000 mg | ORAL_CAPSULE | Freq: Every day | ORAL | Status: DC | PRN
  Administered 2024-03-28 – 2024-03-29 (×2): 300 mg via ORAL
  Filled 2024-03-27 (×2): qty 1

## 2024-03-27 MED ORDER — VITAMIN C 500 MG PO TABS
1000.0000 mg | ORAL_TABLET | Freq: Every day | ORAL | Status: DC
  Administered 2024-03-27 – 2024-03-29 (×3): 1000 mg via ORAL
  Filled 2024-03-27 (×3): qty 2

## 2024-03-27 MED ORDER — QUETIAPINE FUMARATE 25 MG PO TABS
12.5000 mg | ORAL_TABLET | Freq: Every day | ORAL | Status: DC
  Administered 2024-03-27 – 2024-03-28 (×2): 12.5 mg via ORAL
  Filled 2024-03-27 (×2): qty 1

## 2024-03-27 MED ORDER — APOAEQUORIN 20 MG PO CAPS: 20.0000 mg | ORAL_CAPSULE | Freq: Every day | ORAL | Status: DC

## 2024-03-27 MED ORDER — BISACODYL 5 MG PO TBEC
10.0000 mg | DELAYED_RELEASE_TABLET | Freq: Every day | ORAL | Status: DC
  Administered 2024-03-27 – 2024-03-29 (×3): 10 mg via ORAL
  Filled 2024-03-27 (×3): qty 2

## 2024-03-27 MED ORDER — SENNA 8.6 MG PO TABS
2.0000 | ORAL_TABLET | Freq: Every day | ORAL | Status: DC
  Administered 2024-03-27 – 2024-03-28 (×2): 17.2 mg via ORAL
  Filled 2024-03-27 (×2): qty 2

## 2024-03-27 MED ORDER — POLYETHYLENE GLYCOL 3350 17 G PO PACK
17.0000 g | PACK | Freq: Two times a day (BID) | ORAL | Status: DC
  Administered 2024-03-27 – 2024-03-28 (×3): 17 g via ORAL
  Filled 2024-03-27 (×5): qty 1

## 2024-03-27 NOTE — Evaluation (Signed)
 Physical Therapy Evaluation Patient Details Name: Susan Ford MRN: [id number] DOB: 1929-11-20 Today's Date: 03/27/2024  History of Present Illness  Pt admitted again  on 03/26/24 from [facility]. was just DC'd back to facility on 11/30 following a fall.  Negative for injury L hip on imagingg at the time and dx wtih L lower quadrant pain in setting of stecoral colitis.  Pt with hx of dementia. PE, back surgery, ERCP, bil cataracts, and idiopathic peripheral neuropathy  Clinical Impression   Pt admitted with above diagnosis.  Pt currently with functional limitations due to the deficits listed below (see PT Problem List). Pt will benefit from acute skilled PT to increase their independence and safety with mobility to allow discharge.       The patient is alert and oriented x 3. Patient  resides in ILF and has had several falls. Patient currently requiring assistance for mobility.   Patient able to stand and step for transfers. Patient will benefit from continued inpatient follow up therapy, <3 hours/day     If plan is discharge home, recommend the following: A lot of help with walking and/or transfers;A lot of help with bathing/dressing/bathroom;Assistance with cooking/housework;Assist for transportation;Help with stairs or ramp for entrance   Can travel by private vehicle        Equipment Recommendations None recommended by PT  Recommendations for Other Services       Functional Status Assessment Patient has had a recent decline in their functional status and demonstrates the ability to make significant improvements in function in a reasonable and predictable amount of time.     Precautions / Restrictions Precautions Precautions: Fall Restrictions Weight Bearing Restrictions Per Provider Order: No      Mobility  Bed Mobility   Bed Mobility: Rolling, Sidelying to Sit Rolling: Max assist Sidelying to sit: Max assist       General bed mobility comments: multimodal cues  for  rolling, max assist for legs over bed edge , assist trunk.  Patient did scoot to bed edge with UE use    Transfers Overall transfer level: Needs assistance Equipment used: Rolling walker (2 wheels) Transfers: Sit to/from Stand, Bed to chair/wheelchair/BSC Sit to Stand: Mod assist, +2 safety/equipment   Step pivot transfers: Mod assist, +2 safety/equipment       General transfer comment: cues for use of UEs to self assist; step pvt bed to [facility] then to Recliner, feet sliding forward at sit to stand    Ambulation/Gait                  Stairs            Wheelchair Mobility     Tilt Bed    Modified Rankin (Stroke Patients Only)       Balance Overall balance assessment: Needs assistance Sitting-balance support: No upper extremity supported, Feet supported Sitting balance-Leahy Scale: Fair     Standing balance support: Bilateral upper extremity supported, During functional activity Standing balance-Leahy Scale: Poor                               Pertinent Vitals/Pain Pain Assessment Faces Pain Scale: Hurts even more Pain Location: right hip when Leg was lifted then  the left hip when  reositioned in recliner Pain Descriptors / Indicators: Aching, Grimacing Pain Intervention(s): Monitored during session, Limited activity within patient's tolerance    Home Living Family/patient expects to be discharged to:: Skilled nursing facility  Home Equipment: Agricultural Consultant (2 wheels);[place] - single point Additional Comments: multiple falls in ILF    Prior Function               Mobility Comments: required asssistance for ambulation on 11/30 in PT ADLs Comments: unsure     Extremity/Trunk Assessment   Upper Extremity Assessment Upper Extremity Assessment: Defer to OT evaluation    Lower Extremity Assessment Lower Extremity Assessment: Generalized weakness;LLE deficits/detail;RLE deficits/detail       Communication    Communication Communication: Impaired Factors Affecting Communication: Hearing impaired    Cognition Arousal: Alert Behavior During Therapy: Flat affect   PT - Cognitive impairments: No family/caregiver present to determine baseline                       PT - Cognition Comments: oriented to WL, Dec, BD Following commands: Impaired Following commands impaired: Follows one step commands with increased time     Cueing Cueing Techniques: Verbal cues, Gestural cues     General Comments      Exercises     Assessment/Plan    PT Assessment Patient needs continued PT services  PT Problem List Decreased strength;Decreased activity tolerance;Decreased mobility;Decreased balance;Decreased knowledge of use of DME;Pain       PT Treatment Interventions      PT Goals (Current goals can be found in the Care Plan section)  Acute Rehab PT Goals PT Goal Formulation: Patient unable to participate in goal setting Time For Goal Achievement: 04/10/24 Potential to Achieve Goals: Fair    Frequency Min 2X/week     Co-evaluation               AM-PAC PT 6 Clicks Mobility  Outcome Measure Help needed turning from your back to your side while in a flat bed without using bedrails?: A Lot Help needed moving from lying on your back to sitting on the side of a flat bed without using bedrails?: A Lot Help needed moving to and from a bed to a chair (including a wheelchair)?: A Lot Help needed standing up from a chair using your arms (e.g., wheelchair or bedside chair)?: A Lot Help needed to walk in hospital room?: Total Help needed climbing 3-5 steps with a railing? : Total 6 Click Score: 10    End of Session Equipment Utilized During Treatment: Gait belt Activity Tolerance: Patient tolerated treatment well Patient left: in chair;with call bell/phone within reach;with chair alarm set;with nursing/sitter in room Nurse Communication: Mobility status PT Visit Diagnosis:  Difficulty in walking, not elsewhere classified (R26.2);Muscle weakness (generalized) (M62.81)    Time: 8682-8659 PT Time Calculation (min) (ACUTE ONLY): 23 min   Charges:   PT Evaluation $PT Eval Low Complexity: 1 Low PT Treatments $Therapeutic Activity: 8-22 mins PT General Charges $$ ACUTE PT VISIT: 1 Visit         Darice Potters PT Acute Rehabilitation Services Office [phone]   Potters Darice Norris 03/27/2024, 2:40 PM

## 2024-03-27 NOTE — NC FL2 (Signed)
 [place]  MEDICAID FL2 LEVEL OF CARE FORM     IDENTIFICATION  Patient Name: BAELYN DORING Birthdate: 07-12-1929 Sex: female Admission Date (Current Location): 03/26/2024  [facility] and [place] Number:  Producer, Television/film/video and Address:  [facility],  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   Left lower quadrant abdominal pain 03/22/2024   Influenza A 07/08/2023   Physical deconditioning 07/06/2023   ABLA (acute blood loss anemia) 05/07/23   History of bleeding peptic ulcer 2023/05/07   History of pulmonary embolism, 12/28/22 07-May-2023   Chronic anticoagulation May 07, 2023   Acute GI bleeding 2023-05-07   Acute UTI May 07, 2023   Pulmonary embolism (HCC) 12/28/2022   Diarrhea 12/28/2022   Melena, with acute blood loss anemia 07/08/2022   Dementia without behavioral disturbance (HCC) 10/03/2017   Gait abnormality 06/29/2016   Chronic bilateral low back pain without sciatica 06/29/2016   Memory loss 11-09-15   Death of husband 05-06-2014   Bereavement due to life event 05/06/14   Absolute anemia 04/08/2014   Atrial flutter (HCC) 02/23/2014   Acute calculous cholecystitis 02/21/2014   Spinal stenosis of lumbar region 01/04/2014   Recent bereavement 10/25/2013   Arthritis 10/25/2013   Hyperlipidemia 10/23/2013   Hereditary and idiopathic peripheral neuropathy    Nausea alone  07/14/2012   Neuropathy, peripheral 05/05/2011   High risk medication use 05/05/2011   Subcutaneous emphysema 05/05/2011   Cough 04/03/2011   Neuropathy 04/03/2011   Osteoarthritis 05/15/2010   STIFFNESS OF JOINT NEC ANKLE AND FOOT 05/15/2010   GANGLION CYST, WRIST, RIGHT 05/15/2010   VERTIGO, BENIGN PAROXYSMAL POSITION 01/31/2007    Orientation RESPIRATION BLADDER Height & Weight     Self  Normal External catheter, Incontinent Weight:   Height:     BEHAVIORAL SYMPTOMS/MOOD NEUROLOGICAL BOWEL NUTRITION STATUS      Incontinent Diet  AMBULATORY STATUS COMMUNICATION OF NEEDS Skin   Extensive Assist Verbally Normal                       Personal Care Assistance Level of Assistance  Bathing, Feeding, Dressing Bathing Assistance: Limited assistance Feeding assistance: Limited assistance Dressing Assistance: Limited assistance     Functional Limitations Info  Sight, Hearing Sight Info: Impaired Hearing Info: Impaired      SPECIAL CARE FACTORS FREQUENCY  PT (By licensed PT), OT (By licensed OT)     PT Frequency: 5x weekly OT Frequency: 5x weekly            Contractures Contractures Info: Not present    Additional Factors Info  Code Status, Allergies, Psychotropic Code Status Info: DNR-LIMITED Allergies Info: Lyrica, Oxytetracycline, Memantine  Psychotropic Info: Seroquel         Current Medications (03/27/2024):  This is the current hospital active medication list Current Facility-Administered Medications  Medication Dose Route Frequency Provider Last Rate Last Admin   acetaminophen  (TYLENOL ) tablet 650 mg  650 mg Oral Q6H PRN Celinda Alm Lot, MD   650 mg at 03/26/24 2330   Or   acetaminophen  (TYLENOL ) suppository 650 mg  650 mg Rectal Q6H PRN Celinda Alm Lot, MD       ascorbic acid  (VITAMIN C ) tablet 1,000 mg  1,000 mg Oral Daily Will Almarie MATSU, MD   1,000 mg at 03/27/24 1106   bisacodyl  (DULCOLAX) EC tablet 10 mg  10 mg Oral Daily Mathews,  Elizabeth G, MD   10 mg at 03/27/24 1106   cefTRIAXone  (ROCEPHIN ) 1 g in sodium chloride  0.9 % 100 mL IVPB  1 g Intravenous Q24H Celinda Alm Lot, MD 200 mL/hr at 03/27/24 1255 1 g at 03/27/24 1255   enoxaparin  (LOVENOX ) injection 40 mg  40 mg Subcutaneous Q24H Celinda Alm Lot, MD   40 mg at 03/26/24 2225   gabapentin  (NEURONTIN ) capsule 300 mg  300 mg Oral Daily PRN Mathews, Elizabeth G, MD       haloperidol lactate (HALDOL) injection 2 mg  2 mg Intravenous Q6H PRN Celinda Alm Lot, MD   2 mg at 03/26/24 1651   ondansetron  (ZOFRAN ) tablet 4 mg  4 mg Oral Q6H PRN Celinda Alm Lot, MD       Or   ondansetron  (ZOFRAN ) injection 4 mg  4 mg Intravenous Q6H PRN Celinda Alm Lot, MD       polyethylene glycol (MIRALAX  / GLYCOLAX ) packet 17 g  17 g Oral BID Will Almarie MATSU, MD   17 g at 03/27/24 1106   QUEtiapine (SEROQUEL) tablet 12.5 mg  12.5 mg Oral QHS Mathews, Elizabeth G, MD       senna [facility]) tablet 17.2 mg  2 tablet Oral QHS Will Almarie MATSU, MD         Discharge Medications: Please see discharge summary for a list of discharge medications.  Relevant Imaging Results:  Relevant Lab Results:   Additional Information SSN: [id number]  Doneta Glenys DASEN, RN

## 2024-03-27 NOTE — TOC Initial Note (Addendum)
 Transition of Care [facility]) - Initial/Assessment Note    Patient Details  Name: Susan Ford MRN: [id number] Date of Birth: [birth year]  [facility] CM/SW Contact:    Doneta Glenys DASEN, RN Phone Number: 03/27/2024, 12:49 PM  Clinical Narrative:                  Patient presented for a fall. Discharged on 11/30. PTA patient was from [facility] independent living. DME (cane , rollatoe, and shower chair). CM spoke with Robert(son) [phone]. Patient and Lamar agreeable to SNF referrals via HUB. Requested [facility] and [facility]. CM explained SNF process. Case Manager will follow progression for SNF placement.SABRA 1:07 PM FL2, PASRR completed. Waiting bed offers. 3:08 PM CM called Lamar [phone]) for consent. 4:30 PM Lamar presented with choice list. Lamar chose [facility]. [facility] Star updated. Insurance started.  Expected Discharge Plan: Skilled Nursing Facility Barriers to Discharge: Continued Medical Work up, SNF Pending bed offer   Patient Goals and CMS Choice Patient states their goals for this hospitalization and ongoing recovery are:: SNF CMS Medicare.gov Compare Post Acute Care list provided to:: Patient Represenative (must comment) Beverlie (son)) Choice offered to / list presented to : Adult Children [place] ownership interest in [facility].provided to:: Adult Children    Expected Discharge Plan and Services In-house Referral: NA Discharge Planning Services: CM Consult Post Acute Care Choice: NA Living arrangements for the past 2 months: Independent Living Facility                 DME Arranged: N/A DME Agency: NA       HH Arranged: NA HH Agency: NA     Representative spoke with at [facility] Agency: [name]  Prior Living Arrangements/Services Living arrangements for the past 2 months: Marketing Executive Lives with:: Facility Resident ([facility]) Patient language and need for interpreter reviewed:: Yes Do you  feel safe going back to the place where you live?: Yes      Need for Family Participation in Patient Care: Yes (Comment) Care giver support system in place?: Yes (comment) Current home services:  (NA) Criminal Activity/Legal Involvement Pertinent to Current Situation/Hospitalization: No - Comment as needed  Activities of Daily Living   ADL Screening (condition at time of admission) Independently performs ADLs?: No Does the patient have a NEW difficulty with bathing/dressing/toileting/self-feeding that is expected to last >3 days?: Yes (Initiates electronic notice to provider for possible OT consult) Does the patient have a NEW difficulty with getting in/out of bed, walking, or climbing stairs that is expected to last >3 days?: Yes (Initiates electronic notice to provider for possible PT consult) Does the patient have a NEW difficulty with communication that is expected to last >3 days?: No Is the patient deaf or have difficulty hearing?: Yes Does the patient have difficulty seeing, even when wearing glasses/contacts?: Yes Does the patient have difficulty concentrating, remembering, or making decisions?: Yes  Permission Sought/Granted Permission sought to share information with : Case Manager Permission granted to share information with : Yes, Verbal Permission Granted  Share Information with NAME: Lamar (son) [phone]  Permission granted to share info w AGENCY: [facility]        Emotional Assessment Appearance:: Appears stated age Attitude/Demeanor/Rapport: Gracious Affect (typically observed): Appropriate Orientation: : Oriented to Self, Oriented to Place, Oriented to  Time, Oriented to Situation Alcohol  / Substance Use: Not Applicable Psych Involvement: No (comment)  Admission diagnosis:  Generalized weakness [R53.1] Acute UTI (urinary tract infection) [N39.0] Urinary tract  infection without hematuria, site unspecified [N39.0] Patient Active Problem List   Diagnosis Date  Noted   Acute UTI (urinary tract infection) 03/26/2024   Mild protein malnutrition 03/26/2024   Common bile duct stone 03/22/2024   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   Left lower quadrant abdominal pain 03/22/2024   Influenza A 07/08/2023   Physical deconditioning 07/06/2023   ABLA (acute blood loss anemia) 05/05/2023   History of bleeding peptic ulcer 05/05/23   History of pulmonary embolism, 12/28/22 05/05/2023   Chronic anticoagulation 05/05/2023   Acute GI bleeding 05-05-23   Acute UTI May 05, 2023   Pulmonary embolism (HCC) 12/28/2022   Diarrhea 12/28/2022   Melena, with acute blood loss anemia 07/08/2022   Dementia without behavioral disturbance (HCC) 10/03/2017   Gait abnormality 06/29/2016   Chronic bilateral low back pain without sciatica 06/29/2016   Memory loss Nov 07, 2015   Death of husband 05-04-2014   Bereavement due to life event 2014-05-04   Absolute anemia 04/08/2014   Atrial flutter (HCC) 02/23/2014   Acute calculous cholecystitis 02/21/2014   Spinal stenosis of lumbar region 01/04/2014   Recent bereavement 10/25/2013   Arthritis 10/25/2013   Hyperlipidemia 10/23/2013   Hereditary and idiopathic peripheral neuropathy    Nausea alone 07/14/2012   Neuropathy, peripheral 05/05/2011   High risk medication use 05/05/2011   Subcutaneous emphysema 05/05/2011   Cough 04/03/2011   Neuropathy 04/03/2011   Osteoarthritis 05/15/2010   STIFFNESS OF JOINT NEC ANKLE AND FOOT 05/15/2010   GANGLION CYST, WRIST, RIGHT 05/15/2010   VERTIGO, BENIGN PAROXYSMAL POSITION 01/31/2007   PCP:  Charlett [name], MD Pharmacy:   [facility] [phone] - [place], [place] - [address] AT [facility] OF [address] [place] [address] Phone: [phone] Fax: [phone]     Social Drivers of Health (SDOH) Social History: SDOH Screenings   Food Insecurity: No Food Insecurity (03/26/2024)  Housing: Low Risk  (03/26/2024)  Transportation Needs:  Unmet Transportation Needs (03/26/2024)  Utilities: Not At Risk (03/26/2024)  Alcohol  Screen: Low Risk  (06/15/2023)  Depression (PHQ2-9): Low Risk  (06/15/2023)  Financial Resource Strain: Low Risk  (06/15/2023)  Physical Activity: Inactive (06/15/2023)  Social Connections: Moderately Integrated (03/26/2024)  Stress: No Stress Concern Present (06/15/2023)  Tobacco Use: Medium Risk (03/26/2024)  Health Literacy: Adequate Health Literacy (06/15/2023)   SDOH Interventions:     Readmission Risk Interventions    03/27/2024   12:46 PM  Readmission Risk Prevention Plan  Transportation Screening Complete  Medication Review (RN Care Manager) Complete  PCP or Specialist appointment within 3-5 days of discharge Complete  HRI or Home Care Consult Complete  SW Recovery Care/Counseling Consult Complete  Palliative Care Screening Not Applicable  Skilled Nursing Facility Complete

## 2024-03-27 NOTE — Care Management CC44 (Signed)
 Condition Code 44 Documentation Completed  Patient Details  Name: Susan Ford MRN: [id number] Date of Birth: 1929/08/30   Condition Code 44 given:  Yes Patient signature on Condition Code 44 notice:  Yes Documentation of 2 MD's agreement:  Yes Code 44 added to claim:  Yes    Doneta Glenys DASEN, RN 03/27/2024, 3:11 PM

## 2024-03-27 NOTE — Care Management Obs Status (Signed)
 MEDICARE OBSERVATION STATUS NOTIFICATION   Patient Details  Name: Susan Ford MRN: [id number] Date of Birth: October 27, 1929   Medicare Observation Status Notification Given:  Yes    Doneta Glenys DASEN, RN 03/27/2024, 3:11 PM

## 2024-03-27 NOTE — Telephone Encounter (Signed)
 Copied from CRM #[id number]. Topic: General - Other >> Mar 26, 2024 10:52 AM Aleatha BROCKS wrote: Reason for CRM: [facility] care service needs forms completed for patient therapy Patient son Octaviano has been working with Izetta [phone]  and she can help with any further information

## 2024-03-27 NOTE — Progress Notes (Signed)
 PROGRESS NOTE    Susan Ford  FMW:[id number] DOB: 1929/10/12 DOA: 03/26/2024 PCP: Charlett [name], MD  Brief Narrative: Patient came from independent living [facility] Patient was recently discharged on 03/25/2024 after a 3-day stay for stercoral colitis and severe constipation/obstipation.  She had manual disimpaction and multiple stool softeners and laxatives given with improvement.  She was discharged back to the independent living per family request.  They found her on the floor and they decided to bring her back to the ED.  She has a history of dementia she does complain of lower abdominal pain.  Her UA was consistent with UTI she was discharged on Keflex .  She was admitted and started on Rocephin .  Urine culture is pending. Her medical history significant of osteoarthritis, hyperlipidemia, bilateral cataracts, choledocholithiasis, cholecystitis, history of ERCP, cholecystectomy, single episode transient postcholecystectomy A-fib, history of a flutter, history of anticoagulation, history of GI bleed, GERD, hereditary and idiopathic peripheral neuropathy, history of pulmonary embolism who was recently admitted and discharged for 3 days due to stercoral colitis who was sent by her facility again to the emergency department due to abdominal pain.    Assessment & Plan:   Principal Problem:   Acute UTI (urinary tract infection) Active Problems:   Neuropathy, peripheral   Hyperlipidemia   Dementia without behavioral disturbance (HCC)   Mild protein malnutrition   #1 abdominal pain patient with recent obstipation and stercoral colitis and UA consistent with UTI.  Constipation has been resolved.  She complains of lower abdominal pain and has suprapubic tenderness.  Urine culture is pending.  Continue Rocephin . Consult PT (she was sent back to independent living and came back to the ER in 2 days due to weakness and finding her on the floor.)  She probably will benefit from skilled nursing  rehabilitation prior to returning to [facility].  #2 neuropathy continue gabapentin   #3 hyperlipidemia on simvastatin   #4 dementia with behaviors and sundowning will start her on a low-dose Seroquel  #5 goals of care patient DNR  Estimated body mass index is 19.64 kg/m as calculated from the following:   Height as of 03/22/24: 5' 4 (1.626 m).   Weight as of 03/22/24: 51.9 kg.  DVT prophylaxis: Lovenox  Code Status: DNR  family Communication: Discussed with son Lamar Disposition Plan:  Status is: Inpatient Remains inpatient appropriate because: acute illness   Consultants: none  Procedures:none Antimicrobials:rocephin   Subjective: Patient resting in bed she is awake she is oriented to hospital she complains of lower abdominal pain and tenderness  Objective: Vitals:   03/26/24 1920 03/26/24 2327 03/27/24 0639 03/27/24 0749  BP: (!) 145/73 137/78 (!) 147/86 139/77  Pulse: 80 94 74 67  Resp: 15 16 16 16   Temp: 98.3 F (36.8 C) 100 F (37.8 C) 98.7 F (37.1 C) 98.6 F (37 C)  TempSrc:      SpO2: 97% 97% 98% 98%    Intake/Output Summary (Last 24 hours) at 03/27/2024 0936 Last data filed at 03/27/2024 0830 Gross per 24 hour  Intake 581.67 ml  Output --  Net 581.67 ml   There were no vitals filed for this visit.  Examination:  General exam: Appears in no acute distress  respiratory system: Clear to auscultation. Respiratory effort normal. Cardiovascular system: S1 & S2 heard, RRR. No JVD, murmurs, rubs, gallops or clicks. No pedal edema. Gastrointestinal system: Abdomen is nondistended, soft and suprapubic tender. No organomegaly or masses felt. Normal bowel sounds heard. Central nervous system: Oriented to place  extremities:  No edema  Data Reviewed: I have personally reviewed following labs and imaging studies  CBC: Recent Labs  Lab 03/20/24 1507 03/22/24 1221 03/23/24 0412 03/25/24 1030 03/25/24 2247 03/26/24 1045 03/27/24 0536  WBC 8.6 9.7 8.6 9.3  11.3* 8.7 7.8  NEUTROABS 5.8 7.2  --   --   --  6.2  --   HGB 12.2 12.7 11.7* 12.5 13.0 12.5 11.8*  HCT 37.2 37.7 35.1* 38.0 38.8 39.7 35.5*  MCV 93.9 92.0 92.1 93.8 91.3 97.5 92.9  PLT 257 293 259 301 312 261 282   Basic Metabolic Panel: Recent Labs  Lab 03/23/24 0412 03/25/24 1030 03/25/24 2247 03/26/24 1216 03/27/24 0536  NA 134* 132* 134* 133* 134*  K 3.8 4.0 4.3 4.1 4.0  CL 101 99 99 101 101  CO2 22 22 25 22 23   GLUCOSE 102* 107* 116* 106* 109*  BUN 15 15 17 15 15   CREATININE 0.66 0.68 0.80 0.62 0.61  CALCIUM 8.7* 8.8* 9.0 8.9 8.8*   GFR: Estimated Creatinine Clearance: 36.8 mL/min (by C-G formula based on SCr of 0.61 mg/dL). Liver Function Tests: Recent Labs  Lab 03/23/24 0412 03/25/24 1030 03/25/24 2247 03/26/24 1216 03/27/24 0536  AST 26 19 19 17 15   ALT 8 9 9 7 6   ALKPHOS 64 81 79 76 72  BILITOT 0.6 0.5 0.6 0.7 0.6  PROT 6.0* 6.2* 6.8 6.4* 6.2*  ALBUMIN 3.2* 3.3* 3.6 3.4* 3.2*   Recent Labs  Lab 03/26/24 1216  LIPASE 25   No results for input(s): AMMONIA in the last 168 hours. Coagulation Profile: No results for input(s): INR, PROTIME in the last 168 hours. Cardiac Enzymes: No results for input(s): CKTOTAL, CKMB, CKMBINDEX, TROPONINI in the last 168 hours. BNP (last 3 results) Recent Labs    05/11/23 1527  PROBNP 61.0   HbA1C: No results for input(s): HGBA1C in the last 72 hours. CBG: Recent Labs  Lab 03/25/24 2246 03/26/24 1044  GLUCAP 110* 111*   Lipid Profile: No results for input(s): CHOL, HDL, LDLCALC, TRIG, CHOLHDL, LDLDIRECT in the last 72 hours. Thyroid  Function Tests: No results for input(s): TSH, T4TOTAL, FREET4, T3FREE, THYROIDAB in the last 72 hours. Anemia Panel: No results for input(s): VITAMINB12, FOLATE, FERRITIN, TIBC, IRON, RETICCTPCT in the last 72 hours. Sepsis Labs: Recent Labs  Lab 03/25/24 2256  LATICACIDVEN 1.2    No results found for this or any previous  visit (from the past 240 hours).       Radiology Studies: CT Renal Stone Study Result Date: 03/26/2024 CLINICAL DATA:  Abdominal/flank pain, stone suspected EXAM: CT ABDOMEN AND PELVIS WITHOUT CONTRAST TECHNIQUE: Multidetector CT imaging of the abdomen and pelvis was performed following the standard protocol without IV contrast. RADIATION DOSE REDUCTION: This exam was performed according to the departmental dose-optimization program which includes automated exposure control, adjustment of the mA and/or kV according to patient size and/or use of iterative reconstruction technique. COMPARISON:  March 22, 2024 FINDINGS: Of note, the lack of intravenous contrast limits evaluation of the solid organ parenchyma and vascularity. Lower chest: Unchanged reticulation throughout the lung bases with a few scattered regions of ground-glass attenuation. Left fat containing diaphragmatic hernia is also unchanged. Mild cardiomegaly with multi-vessel coronary atherosclerosis. Small pericardial effusion. Hepatobiliary: No mass.Cholecystectomy. No intrahepatic or extrahepatic biliary ductal dilation. Pancreas: No mass or main ductal dilation. No peripancreatic inflammation or fluid collection. Spleen: Normal size. No mass. Adrenals/Urinary Tract: No adrenal masses. No renal mass. No hydronephrosis or nephrolithiasis. The urinary bladder  is distended without focal abnormality. Stomach/Bowel: The stomach is decompressed without focal abnormality. 5 cm duodenum diverticulum. No small bowel wall thickening or inflammation. No small bowel obstruction. Interval passage of the stool ball in the rectum. Vascular/Lymphatic: No aortic aneurysm. Diffuse aortoiliac atherosclerosis. No intraabdominal or pelvic lymphadenopathy. Reproductive: Hysterectomy. No concerning adnexal mass. No free pelvic fluid. Other: No pneumoperitoneum, ascites, or mesenteric inflammation. Musculoskeletal: No acute fracture or destructive lesion. Diffuse  osteopenia. Multilevel degenerative disc disease of the spine. Healing right inferior pubic ramus fracture. Chronic compression fracture of T11. IMPRESSION: 1. No acute intra-abdominal or pelvic abnormality. 2. Duodenal and total colonic diverticulosis. No changes of acute diverticulitis. 3. Similar findings of chronic interstitial lung disease in the lung bases. No pneumonia or pleural effusion. Aortic Atherosclerosis (ICD10-I70.0). Electronically Signed   By: Rogelia Myers M.D.   On: 03/26/2024 14:19   CT Cervical Spine Wo Contrast Result Date: 03/25/2024 EXAM: CT CERVICAL SPINE WITHOUT CONTRAST 03/25/2024 10:36:00 PM TECHNIQUE: CT of the cervical spine was performed without the administration of intravenous contrast. Multiplanar reformatted images are provided for review. Automated exposure control, iterative reconstruction, and/or weight based adjustment of the mA/kV was utilized to reduce the radiation dose to as low as reasonably achievable. COMPARISON: None available. CLINICAL HISTORY: Neck trauma (Age >= 65y) FINDINGS: CERVICAL SPINE: BONES AND ALIGNMENT: No acute fracture or traumatic malalignment. DEGENERATIVE CHANGES: Multilevel spondylosis, disc space height loss, and degenerative endplate changes greatest at C5-C6 and C6-C7. Multilevel advanced facet arthropathy bilaterally. No severe spinal canal narrowing. SOFT TISSUES: No prevertebral soft tissue swelling. IMPRESSION: 1. No acute abnormality of the cervical spine. Electronically signed by: Norman Gatlin MD 03/25/2024 10:55 PM EST RP Workstation: [id number]   CT Head Wo Contrast Result Date: 03/25/2024 EXAM: CT HEAD WITHOUT CONTRAST 03/25/2024 10:36:00 PM TECHNIQUE: CT of the head was performed without the administration of intravenous contrast. Automated exposure control, iterative reconstruction, and/or weight based adjustment of the mA/kV was utilized to reduce the radiation dose to as low as reasonably achievable. COMPARISON: Comparison  to study dated 02/08/2024. CLINICAL HISTORY: Mental status change, unknown cause. FINDINGS: BRAIN AND VENTRICLES: No acute hemorrhage. No evidence of acute infarct. No hydrocephalus. No extra-axial collection. No mass effect or midline shift. Chronic microvascular ischemia and generalized atrophy. ORBITS: No acute abnormality. SINUSES: No acute abnormality. Debris in the ethmoid air cells and sphenoid sinuses. SOFT TISSUES AND SKULL: No acute soft tissue abnormality. No skull fracture. IMPRESSION: 1. No acute intracranial abnormality. Electronically signed by: Norman Gatlin MD 03/25/2024 10:52 PM EST RP Workstation: [id number]   DG Pelvis 1-2 Views Result Date: 03/25/2024 EXAM: 1 OR 2 VIEW(S) XRAY OF THE PELVIS 03/25/2024 10:22:07 PM COMPARISON: None available. CLINICAL HISTORY: [id number] Fall [id number] FINDINGS: BONES AND JOINTS: Spondylitic changes in the visualized lower lumbar spine. No focal osseous lesion. No no acute fracture or joint dislocation. SOFT TISSUES: Atherosclerotic vascular calcifications. IMPRESSION: 1. No acute fracture. Electronically signed by: Norman Gatlin MD 03/25/2024 10:40 PM EST RP Workstation: [id number]   DG Chest 2 View Result Date: 03/25/2024 EXAM: 2 VIEW(S) XRAY OF THE CHEST 03/25/2024 10:22:07 PM COMPARISON: 02/08/2024 CLINICAL HISTORY: AMS FINDINGS: LUNGS AND PLEURA: No focal pulmonary opacity. No pleural effusion. No pneumothorax. HEART AND MEDIASTINUM: Aortic atherosclerosis and tortuosity. No acute abnormality of the cardiac and mediastinal silhouettes. BONES AND SOFT TISSUES: Thoracic degenerative disc disease. IMPRESSION: 1. No acute cardiopulmonary findings. Electronically signed by: Norman Gatlin MD 03/25/2024 10:39 PM EST RP Workstation: [id number]    Scheduled Meds:  enoxaparin  (LOVENOX ) injection  40 mg Subcutaneous Q24H   Continuous Infusions:  cefTRIAXone  (ROCEPHIN )  IV       LOS: 1 day    Almarie KANDICE Hoots, MD 03/27/2024, 9:36 AM

## 2024-03-27 NOTE — Plan of Care (Signed)

## 2024-03-28 DIAGNOSIS — F0390 Unspecified dementia without behavioral disturbance: Secondary | ICD-10-CM | POA: Diagnosis not present

## 2024-03-28 DIAGNOSIS — E785 Hyperlipidemia, unspecified: Secondary | ICD-10-CM | POA: Diagnosis not present

## 2024-03-28 DIAGNOSIS — G6289 Other specified polyneuropathies: Secondary | ICD-10-CM | POA: Diagnosis not present

## 2024-03-28 DIAGNOSIS — N390 Urinary tract infection, site not specified: Secondary | ICD-10-CM | POA: Diagnosis not present

## 2024-03-28 DIAGNOSIS — E441 Mild protein-calorie malnutrition: Secondary | ICD-10-CM | POA: Diagnosis not present

## 2024-03-28 MED ORDER — SODIUM CHLORIDE 0.9 % IV SOLN
2.0000 g | INTRAVENOUS | Status: DC
  Administered 2024-03-28 – 2024-03-29 (×2): 2 g via INTRAVENOUS
  Filled 2024-03-28 (×2): qty 20

## 2024-03-28 NOTE — Telephone Encounter (Signed)
 Form was sign and fax today.

## 2024-03-28 NOTE — Progress Notes (Signed)
 PROGRESS NOTE    Susan Ford  FMW:[id number] DOB: 1929/08/02 DOA: 03/26/2024 PCP: Charlett [name], MD    Chief Complaint  Patient presents with   Abdominal Pain    Brief Narrative:  Patient came from independent living [facility] Patient was recently discharged on 03/25/2024 after a 3-day stay for stercoral colitis and severe constipation/obstipation.  She had manual disimpaction and multiple stool softeners and laxatives given with improvement.  She was discharged back to the independent living per family request.  They found her on the floor and they decided to bring her back to the ED.  She has a history of dementia she does complain of lower abdominal pain.  Her UA was consistent with UTI she was discharged on Keflex .  She was admitted and started on Rocephin .  Urine culture is pending. Her medical history significant of osteoarthritis, hyperlipidemia, bilateral cataracts, choledocholithiasis, cholecystitis, history of ERCP, cholecystectomy, single episode transient postcholecystectomy A-fib, history of a flutter, history of anticoagulation, history of GI bleed, GERD, hereditary and idiopathic peripheral neuropathy, history of pulmonary embolism who was recently admitted and discharged for 3 days due to stercoral colitis who was sent by her facility again to the emergency department due to abdominal pain.    Assessment & Plan:   Principal Problem:   Acute UTI (urinary tract infection) Active Problems:   Neuropathy, peripheral   Hyperlipidemia   Dementia without behavioral disturbance (HCC)   Mild protein malnutrition   #1 abdominal pain with recent obstipation and stercoral colitis likely secondary to UTI. -Constipation resolved. - Patient on admission with urinalysis consistent with UTI, suprapubic abdominal pain which is slowly improving. - Cultures with > 100,000 colonies of Klebsiella aerogenes, and > 100,000 colonies of E. coli with sensitivities pending. - Increase  Rocephin  to 2 g daily pending sensitivities. - Supportive care. - Continue bowel regimen.  2.  Neuropathy - Continue gabapentin .  3.  Hyperlipidemia -Continue statin.  4.  Dementia with behavioral disturbance/sundowning -Patient noted to have some sundowning early on in the hospitalization and started on Seroquel.   DVT prophylaxis: Lovenox   Code Status: DNR Family Communication: Updated patient.  No family at bedside. Disposition: SNF when medically stable.  Status is: Observation The patient remains OBS appropriate and will d/c before 2 midnights.   Consultants:  None  Procedures:  CT renal stone protocol 03/26/2024 Chest x-ray 03/25/2024 Plain films of the pelvis 03/25/2024  Antimicrobials:  Anti-infectives (From admission, onward)    Start     Dose/Rate Route Frequency Ordered Stop   03/28/24 1215  cefTRIAXone  (ROCEPHIN ) 2 g in sodium chloride  0.9 % 100 mL IVPB        2 g 200 mL/hr over 30 Minutes Intravenous Every 24 hours 03/28/24 1125     03/27/24 1200  cefTRIAXone  (ROCEPHIN ) 1 g in sodium chloride  0.9 % 100 mL IVPB  Status:  Discontinued        1 g 200 mL/hr over 30 Minutes Intravenous Every 24 hours 03/26/24 1446 03/28/24 1125   03/26/24 1200  cefTRIAXone  (ROCEPHIN ) 1 g in sodium chloride  0.9 % 100 mL IVPB        1 g 200 mL/hr over 30 Minutes Intravenous  Once 03/26/24 1157 03/26/24 1503         Subjective: Patient sitting up in chair.  Patient alert oriented to self place and time.  Denies any chest pain or shortness of breath.  States suprapubic abdominal pain has improved.  Tolerating current diet.  Objective: Vitals:  03/27/24 0639 03/27/24 0749 03/27/24 1253 03/28/24 0554  BP: (!) 147/86 139/77 132/75 (!) 142/72  Pulse: 74 67 80 86  Resp: 16 16 18    Temp: 98.7 F (37.1 C) 98.6 F (37 C) 98.2 F (36.8 C) 98 F (36.7 C)  TempSrc:    Oral  SpO2: 98% 98% 99% 96%    Intake/Output Summary (Last 24 hours) at 03/28/2024 1145 Last data filed at  03/28/2024 0500 Gross per 24 hour  Intake 120 ml  Output 500 ml  Net -380 ml   There were no vitals filed for this visit.  Examination:  General exam: Appears calm and comfortable  Respiratory system: Clear to auscultation. Respiratory effort normal. Cardiovascular system: S1 & S2 heard, RRR. No JVD, murmurs, rubs, gallops or clicks. No pedal edema. Gastrointestinal system: Abdomen is nondistended, soft and nontender. No organomegaly or masses felt. Normal bowel sounds heard. Central nervous system: Alert and oriented. No focal neurological deficits. Extremities: Symmetric 5 x 5 power. Skin: No rashes, lesions or ulcers Psychiatry: Judgement and insight appear normal. Mood & affect appropriate.     Data Reviewed: I have personally reviewed following labs and imaging studies  CBC: Recent Labs  Lab 03/22/24 1221 03/23/24 0412 03/25/24 1030 03/25/24 2247 03/26/24 1045 03/27/24 0536  WBC 9.7 8.6 9.3 11.3* 8.7 7.8  NEUTROABS 7.2  --   --   --  6.2  --   HGB 12.7 11.7* 12.5 13.0 12.5 11.8*  HCT 37.7 35.1* 38.0 38.8 39.7 35.5*  MCV 92.0 92.1 93.8 91.3 97.5 92.9  PLT 293 259 301 312 261 282    Basic Metabolic Panel: Recent Labs  Lab 03/23/24 0412 03/25/24 1030 03/25/24 2247 03/26/24 1216 03/27/24 0536  NA 134* 132* 134* 133* 134*  K 3.8 4.0 4.3 4.1 4.0  CL 101 99 99 101 101  CO2 22 22 25 22 23   GLUCOSE 102* 107* 116* 106* 109*  BUN 15 15 17 15 15   CREATININE 0.66 0.68 0.80 0.62 0.61  CALCIUM 8.7* 8.8* 9.0 8.9 8.8*    GFR: Estimated Creatinine Clearance: 36.8 mL/min (by C-G formula based on SCr of 0.61 mg/dL).  Liver Function Tests: Recent Labs  Lab 03/23/24 0412 03/25/24 1030 03/25/24 2247 03/26/24 1216 03/27/24 0536  AST 26 19 19 17 15   ALT 8 9 9 7 6   ALKPHOS 64 81 79 76 72  BILITOT 0.6 0.5 0.6 0.7 0.6  PROT 6.0* 6.2* 6.8 6.4* 6.2*  ALBUMIN 3.2* 3.3* 3.6 3.4* 3.2*    CBG: Recent Labs  Lab 03/25/24 2246 03/26/24 1044  GLUCAP 110* 111*      Recent Results (from the past 240 hours)  Urine Culture     Status: Abnormal (Preliminary result)   Collection Time: 03/26/24 10:31 AM   Specimen: Urine, Random  Result Value Ref Range Status   Specimen Description   Final    URINE, RANDOM Performed at [facility], [address] [place], [place] [address]    Special Requests   Final    NONE Reflexed from [id number] Performed at [facility], [address], [place], [place] [address]    Culture (A)  Final    >=100,000 COLONIES/mL KLEBSIELLA AEROGENES >=100,000 COLONIES/mL ESCHERICHIA COLI SUSCEPTIBILITIES TO FOLLOW Performed at [facility], [address] [place], [place] [address]    Report Status PENDING  Incomplete         Radiology Studies: CT Renal Stone Study Result Date: 03/26/2024 CLINICAL DATA:  Abdominal/flank pain, stone suspected EXAM: CT ABDOMEN AND PELVIS WITHOUT CONTRAST TECHNIQUE: Multidetector CT imaging of the abdomen and pelvis was performed following the standard protocol without IV contrast. RADIATION DOSE REDUCTION: This exam was performed according to the departmental dose-optimization program which includes automated exposure control, adjustment of the mA and/or kV according to patient size and/or use of iterative reconstruction technique. COMPARISON:  March 22, 2024 FINDINGS: Of note, the lack of intravenous contrast limits evaluation of the solid organ parenchyma and vascularity. Lower chest: Unchanged reticulation throughout the lung bases with a few scattered regions of ground-glass attenuation. Left fat containing diaphragmatic hernia is also unchanged. Mild cardiomegaly with multi-vessel coronary atherosclerosis. Small pericardial effusion. Hepatobiliary: No mass.Cholecystectomy. No intrahepatic or extrahepatic biliary ductal dilation. Pancreas: No mass or main ductal dilation. No peripancreatic inflammation or fluid collection. Spleen: Normal size. No  mass. Adrenals/Urinary Tract: No adrenal masses. No renal mass. No hydronephrosis or nephrolithiasis. The urinary bladder is distended without focal abnormality. Stomach/Bowel: The stomach is decompressed without focal abnormality. 5 cm duodenum diverticulum. No small bowel wall thickening or inflammation. No small bowel obstruction. Interval passage of the stool ball in the rectum. Vascular/Lymphatic: No aortic aneurysm. Diffuse aortoiliac atherosclerosis. No intraabdominal or pelvic lymphadenopathy. Reproductive: Hysterectomy. No concerning adnexal mass. No free pelvic fluid. Other: No pneumoperitoneum, ascites, or mesenteric inflammation. Musculoskeletal: No acute fracture or destructive lesion. Diffuse osteopenia. Multilevel degenerative disc disease of the spine. Healing right inferior pubic ramus fracture. Chronic compression fracture of T11. IMPRESSION: 1. No acute intra-abdominal or pelvic abnormality. 2. Duodenal and total colonic diverticulosis. No changes of acute diverticulitis. 3. Similar findings of chronic interstitial lung disease in the lung bases. No pneumonia or pleural effusion. Aortic Atherosclerosis (ICD10-I70.0). Electronically Signed   By: Rogelia Myers M.D.   On: 03/26/2024 14:19        Scheduled Meds:  vitamin C   1,000 mg Oral Daily   bisacodyl   10 mg Oral Daily   enoxaparin  (LOVENOX ) injection  40 mg Subcutaneous Q24H   polyethylene glycol  17 g Oral BID   QUEtiapine  12.5 mg Oral QHS   senna  2 tablet Oral QHS   Continuous Infusions:  cefTRIAXone  (ROCEPHIN )  IV       LOS: 1 day    Time spent: 40 minutes    Toribio Hummer, MD [facility]   To contact the attending provider between 7A-7P or the covering provider during after hours 7P-7A, please log into the web site [email or web address] and access using universal [place] password for that web site. If you do not have the password, please call the hospital operator.  03/28/2024, 11:45 AM

## 2024-03-28 NOTE — Evaluation (Signed)
 Occupational Therapy Evaluation Patient Details Name: Susan Ford MRN: [id number] DOB: [birth year] Today's Date: 03/28/2024   History of Present Illness   Susan Ford is a 89 yr old female brought to the hospital with abdominal pain and was found to have a UTI. PMH: recent hospital discharge on 11-3--25 due to being found to have colitis, OA, HLD, bilateral cataracts, cholecystitis, ERCP, choleystectomy, a fib, GI bleed, peripheral neuropathy, PE, dementia     Clinical Impressions The pt is currently presenting well below her baseline level of functioning for self-care management; she is typically modified independent to independent with ADLs. She is limited by the below listed deficits (see OT problem list). As such her occupational performance is compromised and she requires assist for self-care management. During the session today, she required mod assist to stand using a RW, to transfer onto and off a bedside commode, and for toileting management at bedside commode level. She will benefit from further OT services to maximize her independence with ADLs and to decrease the risk for further weakness and deconditioning. Patient will benefit from continued inpatient follow up therapy, <3 hours/day.      If plan is discharge home, recommend the following:   A lot of help with walking and/or transfers;A lot of help with bathing/dressing/bathroom;Assistance with cooking/housework;Assist for transportation     Functional Status Assessment   Patient has had a recent decline in their functional status and demonstrates the ability to make significant improvements in function in a reasonable and predictable amount of time.     Equipment Recommendations   BSC/3in1     Recommendations for Other Services         Precautions/Restrictions   Precautions Precautions: Fall Restrictions Weight Bearing Restrictions Per Provider Order: No     Mobility Bed Mobility Overal bed  mobility: Needs Assistance Bed Mobility: Supine to Sit     Supine to sit: Mod assist, HOB elevated, Used rails          Transfers Overall transfer level: Needs assistance Equipment used: Rolling walker (2 wheels) Transfers: Sit to/from Stand, Bed to chair/wheelchair/BSC Sit to Stand: Mod assist, From elevated surface     Step pivot transfers: Mod assist         Balance     Sitting balance-Leahy Scale: Fair       Standing balance-Leahy Scale: Poor         ADL either performed or assessed with clinical judgement   ADL Overall ADL's : Needs assistance/impaired Eating/Feeding: Independent;Sitting   Grooming: Set up;Sitting Grooming Details (indicate cue type and reason): She performed hand washing seated in the bedside chair. Upper Body Bathing: Set up;Sitting   Lower Body Bathing: Moderate assistance;Sitting/lateral leans;Sit to/from stand   Upper Body Dressing : Set up;Sitting   Lower Body Dressing: Moderate assistance;Sitting/lateral leans   Toilet Transfer: Moderate assistance;Cueing for safety;Stand-pivot;BSC/3in1;Rolling walker (2 wheels) Toilet Transfer Details (indicate cue type and reason): Pt required assist to transfer onto and off the bedside commode using a RW. She required assist for balance, cues for walker placement and cues to reach for toilet rail prior to sitting. Toileting- Clothing Manipulation and Hygiene: Moderate assistance;Sit to/from stand;Cueing for safety Toileting - Clothing Manipulation Details (indicate cue type and reason): The pt required CGA to min assist for steadying in standing, mod assist for clothing management, and verbal cues for best body positioning. She was able to perform posterior peri-hygiene in standing, though needing assist for balance throughout.  Vision   Additional Comments: She correctly read the time depicted on the wall clock.            Pertinent Vitals/Pain Pain Assessment Pain  Assessment: Faces Pain Score: 4  Pain Location: low back and L flank Pain Intervention(s): Limited activity within patient's tolerance, Monitored during session, Repositioned     Extremity/Trunk Assessment Upper Extremity Assessment Upper Extremity Assessment: Generalized weakness;LUE deficits/detail;Right hand dominant LUE Deficits / Details: AROM WFL. Gross strength 4-/5 to 4/5   Lower Extremity Assessment Lower Extremity Assessment: Generalized weakness;RLE deficits/detail;LLE deficits/detail RLE Deficits / Details: AROM WFL LLE Deficits / Details: AROM WFL       Communication Communication Factors Affecting Communication: Hearing impaired   Cognition Arousal: Alert Behavior During Therapy: Flat affect Cognition: History of cognitive impairments             OT - Cognition Comments: Per medical chart indicates a history of dementia. She was oriented to person, place, month, and year. She was able to follow 1 step commands with slightly increased time.                   Following commands impaired: Follows one step commands with increased time     Cueing  General Comments   Cueing Techniques: Verbal cues;Gestural cues              Home Living Family/patient expects to be discharged to:: Skilled nursing facility     Type of Home: Independent living facility ([facility])       Home Layout: One level               Home Equipment: Educational Psychologist (4 wheels)          Prior Functioning/Environment Prior Level of Function : Independent/Modified Independent             Mobility Comments:  (She used a rollator for ambulation.) ADLs Comments:  (She was modified independent to independent with ADLs. Meals and cleaning services are provided by facility staff.)    OT Problem List: Decreased strength;Decreased activity tolerance;Impaired balance (sitting and/or standing);Decreased knowledge of use of DME or AE;Pain   OT  Treatment/Interventions: Self-care/ADL training;Therapeutic exercise;Energy conservation;DME and/or AE instruction;Therapeutic activities;Balance training;Patient/family education      OT Goals(Current goals can be found in the care plan section)   Acute Rehab OT Goals OT Goal Formulation: With patient Time For Goal Achievement: 04/11/24 Potential to Achieve Goals: Good ADL Goals Pt Will Perform Grooming: with set-up;with supervision;standing Pt Will Perform Lower Body Dressing: with supervision;sit to/from stand;sitting/lateral leans Pt Will Transfer to Toilet: with supervision;ambulating Pt Will Perform Toileting - Clothing Manipulation and hygiene: with supervision;sit to/from stand   OT Frequency:  Min 2X/week       AM-PAC OT 6 Clicks Daily Activity     Outcome Measure Help from another person eating meals?: None Help from another person taking care of personal grooming?: A Little Help from another person toileting, which includes using toliet, bedpan, or urinal?: A Lot Help from another person bathing (including washing, rinsing, drying)?: A Lot Help from another person to put on and taking off regular upper body clothing?: A Little Help from another person to put on and taking off regular lower body clothing?: A Lot 6 Click Score: 16   End of Session Equipment Utilized During Treatment: Gait belt;Rolling walker (2 wheels) Nurse Communication: Mobility status  Activity Tolerance: Other (comment) (Fair tolerance) Patient left: in chair;with call bell/phone within reach;with chair alarm  set  OT Visit Diagnosis: Unsteadiness on feet (R26.81);Muscle weakness (generalized) (M62.81);Pain;Other abnormalities of gait and mobility (R26.89) Pain - part of body:  (low back and L side)                Time: 8984-8955 OT Time Calculation (min): 29 min Charges:  OT General Charges $OT Visit: 1 Visit OT Evaluation $OT Eval Moderate Complexity: 1 Mod OT Treatments $Self Care/Home  Management : 8-22 mins    Delanna JINNY Lesches, OTR/L 03/28/2024, 1:44 PM

## 2024-03-28 NOTE — Plan of Care (Signed)
  Problem: Coping: Goal: Level of anxiety will decrease Outcome: Progressing   Problem: Elimination: Goal: Will not experience complications related to urinary retention Outcome: Progressing   Problem: Pain Managment: Goal: General experience of comfort will improve and/or be controlled Outcome: Progressing   Problem: Safety: Goal: Ability to remain free from injury will improve Outcome: Progressing   Problem: Skin Integrity: Goal: Risk for impaired skin integrity will decrease Outcome: Progressing   Problem: Education: Goal: Knowledge of General Education information will improve Description: Including pain rating scale, medication(s)/side effects and non-pharmacologic comfort measures Outcome: Not Progressing

## 2024-03-28 NOTE — TOC Progression Note (Addendum)
 Transition of Care [facility]) - Progression Note    Patient Details  Name: Susan Ford MRN: [id number] Date of Birth: 11-02-1929  Transition of Care [facility] CM/SW Contact  Tawni CHRISTELLA Eva, LCSW Phone Number: 03/28/2024, 9:55 AM  Clinical Narrative:     The pt's insurance authorization for SNF placement at [facility] was approved for 03/28/24-03/30/24.  CSW received a message from Star, admissions at [facility], stating that the pt currently has a sitter and will need to be sitter-free for 24 hours before they can accept him. MD and RN made aware. ICM to follow.  Expected Discharge Plan: Skilled Nursing Facility Barriers to Discharge: Continued Medical Work up, SNF Pending bed offer               Expected Discharge Plan and Services In-house Referral: NA Discharge Planning Services: CM Consult Post Acute Care Choice: NA Living arrangements for the past 2 months: Independent Living Facility                 DME Arranged: N/A DME Agency: NA       HH Arranged: NA HH Agency: NA     Representative spoke with at [facility] Agency: [name]   Social Drivers of Health (SDOH) Interventions SDOH Screenings   Food Insecurity: No Food Insecurity (03/26/2024)  Housing: Low Risk  (03/26/2024)  Transportation Needs: Unmet Transportation Needs (03/26/2024)  Utilities: Not At Risk (03/26/2024)  Alcohol  Screen: Low Risk  (06/15/2023)  Depression (PHQ2-9): Low Risk  (06/15/2023)  Financial Resource Strain: Low Risk  (06/15/2023)  Physical Activity: Inactive (06/15/2023)  Social Connections: Moderately Integrated (03/26/2024)  Stress: No Stress Concern Present (06/15/2023)  Tobacco Use: Medium Risk (03/26/2024)  Health Literacy: Adequate Health Literacy (06/15/2023)    Readmission Risk Interventions    03/27/2024   12:46 PM  Readmission Risk Prevention Plan  Transportation Screening Complete  Medication Review (RN Care Manager) Complete  PCP or Specialist appointment within 3-5 days of discharge  Complete  HRI or Home Care Consult Complete  SW Recovery Care/Counseling Consult Complete  Palliative Care Screening Not Applicable  Skilled Nursing Facility Complete

## 2024-03-29 DIAGNOSIS — Z7401 Bed confinement status: Secondary | ICD-10-CM | POA: Diagnosis not present

## 2024-03-29 DIAGNOSIS — E441 Mild protein-calorie malnutrition: Secondary | ICD-10-CM

## 2024-03-29 DIAGNOSIS — E785 Hyperlipidemia, unspecified: Secondary | ICD-10-CM | POA: Diagnosis not present

## 2024-03-29 DIAGNOSIS — R4182 Altered mental status, unspecified: Secondary | ICD-10-CM | POA: Diagnosis not present

## 2024-03-29 DIAGNOSIS — N390 Urinary tract infection, site not specified: Secondary | ICD-10-CM | POA: Diagnosis not present

## 2024-03-29 DIAGNOSIS — R531 Weakness: Secondary | ICD-10-CM

## 2024-03-29 DIAGNOSIS — G6289 Other specified polyneuropathies: Secondary | ICD-10-CM | POA: Diagnosis not present

## 2024-03-29 LAB — URINE CULTURE: Culture: >=100000 — AB

## 2024-03-29 MED ORDER — ORAL CARE MOUTH RINSE: 15.0000 mL | OROMUCOSAL | Status: DC | PRN

## 2024-03-29 MED ORDER — SULFAMETHOXAZOLE-TRIMETHOPRIM 800-160 MG PO TABS: 1.0000 | ORAL_TABLET | Freq: Two times a day (BID) | ORAL | Status: DC

## 2024-03-29 MED ORDER — QUETIAPINE FUMARATE 25 MG PO TABS: 12.5000 mg | ORAL_TABLET | Freq: Every day | ORAL | 0 refills | Status: DC

## 2024-03-29 MED ORDER — SULFAMETHOXAZOLE-TRIMETHOPRIM 800-160 MG PO TABS: 1.0000 | ORAL_TABLET | Freq: Two times a day (BID) | ORAL | Status: AC

## 2024-03-29 NOTE — TOC Progression Note (Addendum)
 Transition of Care [facility]) - Progression Note    Patient Details  Name: Susan Ford MRN: [id number] Date of Birth: February 28, 1930  [facility]) CM/SW Contact  Sonda Manuella Quill, RN Phone Number: 03/29/2024, 1:05 PM  Clinical Narrative:    Pt ready for d/c; notified Starr at [facility]; she said pt can admit today, and D/C summary needed by 3PM; Dr Sebastian notified via secure chat; pt's son [name] notified at 1329; he agreed to d/c plan; awaiting orders and D/C summary.   Expected Discharge Plan: Skilled Nursing Facility Barriers to Discharge: Continued Medical Work up, SNF Pending bed offer               Expected Discharge Plan and Services In-house Referral: NA Discharge Planning Services: CM Consult Post Acute Care Choice: NA Living arrangements for the past 2 months: Independent Living Facility                 DME Arranged: N/A DME Agency: NA       HH Arranged: NA HH Agency: NA     Representative spoke with at [facility] Agency: [name]   Social Drivers of Health (SDOH) Interventions SDOH Screenings   Food Insecurity: No Food Insecurity (03/26/2024)  Housing: Low Risk  (03/26/2024)  Transportation Needs: Unmet Transportation Needs (03/26/2024)  Utilities: Not At Risk (03/26/2024)  Alcohol  Screen: Low Risk  (06/15/2023)  Depression (PHQ2-9): Low Risk  (06/15/2023)  Financial Resource Strain: Low Risk  (06/15/2023)  Physical Activity: Inactive (06/15/2023)  Social Connections: Moderately Integrated (03/26/2024)  Stress: No Stress Concern Present (06/15/2023)  Tobacco Use: Medium Risk (03/26/2024)  Health Literacy: Adequate Health Literacy (06/15/2023)    Readmission Risk Interventions    03/27/2024   12:46 PM  Readmission Risk Prevention Plan  Transportation Screening Complete  Medication Review (RN Care Manager) Complete  PCP or Specialist appointment within 3-5 days of discharge Complete  HRI or Home Care Consult Complete  SW Recovery  Care/Counseling Consult Complete  Palliative Care Screening Not Applicable  Skilled Nursing Facility Complete

## 2024-03-29 NOTE — Progress Notes (Signed)
 Physical Therapy Treatment Patient Details Name: Susan Ford MRN: [id number] DOB: Aug 05, 1929 Today's Date: 03/29/2024   History of Present Illness Susan Ford is a 89 yr old female brought to the hospital with abdominal pain and was found to have a UTI. PMH: recent hospital discharge on 11-3--25 due to being found to have colitis, OA, HLD, bilateral cataracts, cholecystitis, ERCP, choleystectomy, a fib, GI bleed, peripheral neuropathy, PE, dementia    PT Comments  Pt is tolerating activity poorly, she moaned in pain with supine to sit. She reported her back hurt. Pt sat edge of bed with significantly kyphotic and R lateral leaning posture for a few minutes, she refused to attempt sit to stand or pivot to recliner 2* back pain. Assisted pt back to supine. Pain medication requested.     If plan is discharge home, recommend the following: A lot of help with bathing/dressing/bathroom;Assistance with cooking/housework;Assist for transportation;Help with stairs or ramp for entrance;Two people to help with walking and/or transfers   Can travel by private vehicle     No  Equipment Recommendations  None recommended by PT    Recommendations for Other Services       Precautions / Restrictions Precautions Precautions: Fall Recall of Precautions/Restrictions: Impaired Restrictions Weight Bearing Restrictions Per Provider Order: No     Mobility  Bed Mobility Overal bed mobility: Needs Assistance Bed Mobility: Supine to Sit, Sit to Supine     Supine to sit: Max assist Sit to supine: Max assist   General bed mobility comments: assist to raise trunk, pivot hips to EOB, and advance BLEs. Pt had kyphotic and R laterally flexed posture sitting EOB. Pt c/o severe pain with movement and in sitting, she refused to attempt transferring sit to stand.  +2 total to scoot to [facility].    Transfers                        Ambulation/Gait                   Stairs              Wheelchair Mobility     Tilt Bed    Modified Rankin (Stroke Patients Only)       Balance   Sitting-balance support: Bilateral upper extremity supported, Feet supported Sitting balance-Leahy Scale: Poor Sitting balance - Comments: very flexed trunk with R lateral lean Postural control: Right lateral lean                                  Communication Communication Communication: Impaired Factors Affecting Communication: Hearing impaired  Cognition Arousal: Alert Behavior During Therapy: Flat affect, Anxious   PT - Cognitive impairments: No family/caregiver present to determine baseline, Orientation   Orientation impairments: Situation, Time                     Following commands: Impaired Following commands impaired: Follows one step commands with increased time    Cueing Cueing Techniques: Verbal cues, Gestural cues  Exercises General Exercises - Lower Extremity Long Arc Quad: AROM, Both, 5 reps, Seated    General Comments        Pertinent Vitals/Pain Pain Assessment Pain Assessment: Faces Faces Pain Scale: Hurts whole lot Pain Location: back Pain Descriptors / Indicators: Grimacing, Moaning Pain Intervention(s): Limited activity within patient's tolerance, Monitored during session, Repositioned, Patient requesting pain meds-RN notified  Home Living                          Prior Function            PT Goals (current goals can now be found in the care plan section) Acute Rehab PT Goals PT Goal Formulation: Patient unable to participate in goal setting Time For Goal Achievement: 04/10/24 Potential to Achieve Goals: Fair Progress towards PT goals: Not progressing toward goals - comment (pain limiting activity tolerance)    Frequency    Min 2X/week      PT Plan      Co-evaluation              AM-PAC PT 6 Clicks Mobility   Outcome Measure  Help needed turning from your back to your side while in  a flat bed without using bedrails?: Total Help needed moving from lying on your back to sitting on the side of a flat bed without using bedrails?: Total Help needed moving to and from a bed to a chair (including a wheelchair)?: Total Help needed standing up from a chair using your arms (e.g., wheelchair or bedside chair)?: Total Help needed to walk in hospital room?: Total Help needed climbing 3-5 steps with a railing? : Total 6 Click Score: 6    End of Session Equipment Utilized During Treatment: Gait belt Activity Tolerance: Patient limited by pain Patient left: with call bell/phone within reach;with nursing/sitter in room;in bed;with bed alarm set Nurse Communication: Mobility status;Need for lift equipment PT Visit Diagnosis: Difficulty in walking, not elsewhere classified (R26.2);Muscle weakness (generalized) (M62.81);Pain     Time: 1217-1230 PT Time Calculation (min) (ACUTE ONLY): 13 min  Charges:    $Therapeutic Activity: 8-22 mins PT General Charges $$ ACUTE PT VISIT: 1 Visit                     Susan Ford PT 03/29/2024  Acute Rehabilitation Services  Office [phone]

## 2024-03-29 NOTE — Discharge Summary (Signed)
 Physician Discharge Summary  Susan Ford FMW:[id number] DOB: 1929-11-25 DOA: 03/26/2024  PCP: Charlett [name], MD  Admit date: 03/26/2024 Discharge date: 03/29/2024  Time spent: 60 minutes  Recommendations for Outpatient Follow-up:  Follow-up with MD at skilled nursing facility.  Patient will need a basic metabolic profile done in 1 week to follow-up on electrolytes and renal function.   Discharge Diagnoses:  Principal Problem:   Acute UTI (urinary tract infection) Active Problems:   Neuropathy, peripheral   Hyperlipidemia   Dementia without behavioral disturbance (HCC)   Mild protein malnutrition   Generalized weakness   Discharge Condition: Stable and improved.  Diet recommendation: Regular  Filed Weights   03/29/24 0832  Weight: 51.4 kg    History of present illness:  HPI per Dr. Celinda Heron Susan Ford is a 89 y.o. female with medical history significant of osteoarthritis, hyperlipidemia, bilateral cataracts, choledocholithiasis, cholecystitis, history of ERCP, cholecystectomy, single episode transient postcholecystectomy A-fib, history of a flutter, history of anticoagulation, history of GI bleed, GERD, hereditary and idiopathic peripheral neuropathy, history of pulmonary embolism who was recently admitted and discharged for 3 days due to stercoral colitis who was sent by her facility again to the emergency department due to abdominal pain.  Denied any pain at this time.  Seems to be more confused than a few days ago.   Lab work: CBC was normal.  Unremarkable lipase.  CMP showed sodium 132 mmol/L, the rest of the electrolytes and renal function were normal.  Glucose 106 mg/dL, total protein 6.4 and albumin 3.4 g/dL with the remaining hepatic functions within expected range.   Imaging: CT renal stone study with any acute intra-abdominal or intrapelvic abnormality.  Duodenal and total colonic diverticulosis without inflammatory changes.  Unchanged chronic interstitial  lung disease at the bases.  Aortic atherosclerosis.   ED course: Initial vital signs were temperature 97.9 F, pulse 80, respiration 20, BP 137/71 mmHg and O2 sat 98% on room air.  The patient received ceftriaxone  1 g IVPB.  Hospital Course:  #1 abdominal pain with recent obstipation and stercoral colitis likely secondary to UTI. -Constipation resolved. - Patient on admission with urinalysis consistent with UTI, suprapubic abdominal pain which is slowly improving. - Cultures with > 100,000 colonies of Klebsiella aerogenes, and > 100,000 colonies of E. coli which was sensitive to the cephalosporins.  - Patient received 4 days of IV Rocephin  during the hospitalization and will be discharged on 1 more day of Bactrim  to complete a course of antibiotic treatment.  - Patient maintained on bowel regimen during hospitalization which should be discharged on.  - Patient will be discharged in stable and improved condition.    2.  Neuropathy - Patient maintained on home regimen gabapentin .   3.  Hyperlipidemia - Patient maintained on statin.   4.  Dementia with behavioral disturbance/sundowning -Patient noted to have some sundowning early on in the hospitalization and started on Seroquel. - No further sundowning. - Outpatient follow-up.  Procedures: CT renal stone protocol 03/26/2024 Chest x-ray 03/25/2024 Plain films of the pelvis 03/25/2024  Consultations: None  Discharge Exam: Vitals:   03/29/24 0832 03/29/24 1200  BP: (!) 118/58 114/75  Pulse: 86   Resp: 17 (!) 24  Temp: 98.2 F (36.8 C) 98.7 F (37.1 C)  SpO2:  100%    General: NAD Cardiovascular: Regular rate rhythm no murmurs rubs or gallops.  No JVD.  No lower extremity edema. Respiratory: Clear to auscultation bilaterally.  No wheezes, no crackles, no rhonchi.  Fair air movement.  Speaking in full sentences.  Discharge Instructions   Discharge Instructions     Diet general   Complete by: As directed    Increase  activity slowly   Complete by: As directed       Allergies as of 03/29/2024       Reactions   Lyrica [pregabalin] Other (See Comments)   High Fever   Oxytetracycline Other (See Comments)   High Fever caused by terramycin   Memantine  Rash        Medication List     STOP taking these medications    cephALEXin  500 MG capsule Commonly known as: KEFLEX        TAKE these medications    acetaminophen  325 MG tablet Commonly known as: TYLENOL  Take 650 mg by mouth every 6 (six) hours as needed for moderate pain (pain score 4-6) or mild pain (pain score 1-3).   bisacodyl  5 MG EC tablet Commonly known as: DULCOLAX Take 2 tablets (10 mg total) by mouth daily.   gabapentin  300 MG capsule Commonly known as: NEURONTIN  TAKE 1 CAPSULE BY MOUTH EVERYDAY AT BEDTIME What changed: See the new instructions.   multivitamin tablet Take 1 tablet by mouth daily after lunch.   ondansetron  4 MG tablet Commonly known as: ZOFRAN  Take 1 tablet (4 mg total) by mouth every 6 (six) hours as needed for nausea.   polyethylene glycol 17 g packet Commonly known as: MIRALAX  / GLYCOLAX  Take 17 g by mouth 2 (two) times daily.   Prevagen Extra Strength 20 MG Caps Generic drug: Apoaequorin Take 20 mg by mouth daily.   QUEtiapine 25 MG tablet Commonly known as: SEROQUEL Take 0.5 tablets (12.5 mg total) by mouth at bedtime.   senna 8.6 MG Tabs tablet Commonly known as: SENOKOT Take 2 tablets (17.2 mg total) by mouth at bedtime.   simvastatin  10 MG tablet Commonly known as: ZOCOR  TAKE 1 TABLET BY MOUTH EVERY DAY   sulfamethoxazole -trimethoprim  800-160 MG tablet Commonly known as: BACTRIM  DS Take 1 tablet by mouth every 12 (twelve) hours for 1 day. Start taking on: March 30, 2024   vitamin C  1000 MG tablet Take 1,000 mg by mouth daily.       Allergies  Allergen Reactions   Lyrica [Pregabalin] Other (See Comments)    High Fever    Oxytetracycline Other (See Comments)     High Fever caused by terramycin    Memantine  Rash    Contact information for follow-up providers     MD AT SNF Follow up.               Contact information for after-discharge care     Destination     [facility], [place] .   Service: Skilled Nursing Contact information: [address] [place]  [phone]                      The results of significant diagnostics from this hospitalization (including imaging, microbiology, ancillary and laboratory) are listed below for reference.    Significant Diagnostic Studies: CT Renal Stone Study Result Date: 03/26/2024 CLINICAL DATA:  Abdominal/flank pain, stone suspected EXAM: CT ABDOMEN AND PELVIS WITHOUT CONTRAST TECHNIQUE: Multidetector CT imaging of the abdomen and pelvis was performed following the standard protocol without IV contrast. RADIATION DOSE REDUCTION: This exam was performed according to the departmental dose-optimization program which includes automated exposure control, adjustment of the mA and/or kV according to patient size and/or  use of iterative reconstruction technique. COMPARISON:  March 22, 2024 FINDINGS: Of note, the lack of intravenous contrast limits evaluation of the solid organ parenchyma and vascularity. Lower chest: Unchanged reticulation throughout the lung bases with a few scattered regions of ground-glass attenuation. Left fat containing diaphragmatic hernia is also unchanged. Mild cardiomegaly with multi-vessel coronary atherosclerosis. Small pericardial effusion. Hepatobiliary: No mass.Cholecystectomy. No intrahepatic or extrahepatic biliary ductal dilation. Pancreas: No mass or main ductal dilation. No peripancreatic inflammation or fluid collection. Spleen: Normal size. No mass. Adrenals/Urinary Tract: No adrenal masses. No renal mass. No hydronephrosis or nephrolithiasis. The urinary bladder is distended without focal abnormality. Stomach/Bowel:  The stomach is decompressed without focal abnormality. 5 cm duodenum diverticulum. No small bowel wall thickening or inflammation. No small bowel obstruction. Interval passage of the stool ball in the rectum. Vascular/Lymphatic: No aortic aneurysm. Diffuse aortoiliac atherosclerosis. No intraabdominal or pelvic lymphadenopathy. Reproductive: Hysterectomy. No concerning adnexal mass. No free pelvic fluid. Other: No pneumoperitoneum, ascites, or mesenteric inflammation. Musculoskeletal: No acute fracture or destructive lesion. Diffuse osteopenia. Multilevel degenerative disc disease of the spine. Healing right inferior pubic ramus fracture. Chronic compression fracture of T11. IMPRESSION: 1. No acute intra-abdominal or pelvic abnormality. 2. Duodenal and total colonic diverticulosis. No changes of acute diverticulitis. 3. Similar findings of chronic interstitial lung disease in the lung bases. No pneumonia or pleural effusion. Aortic Atherosclerosis (ICD10-I70.0). Electronically Signed   By: Rogelia Myers M.D.   On: 03/26/2024 14:19   CT Cervical Spine Wo Contrast Result Date: 03/25/2024 EXAM: CT CERVICAL SPINE WITHOUT CONTRAST 03/25/2024 10:36:00 PM TECHNIQUE: CT of the cervical spine was performed without the administration of intravenous contrast. Multiplanar reformatted images are provided for review. Automated exposure control, iterative reconstruction, and/or weight based adjustment of the mA/kV was utilized to reduce the radiation dose to as low as reasonably achievable. COMPARISON: None available. CLINICAL HISTORY: Neck trauma (Age >= 65y) FINDINGS: CERVICAL SPINE: BONES AND ALIGNMENT: No acute fracture or traumatic malalignment. DEGENERATIVE CHANGES: Multilevel spondylosis, disc space height loss, and degenerative endplate changes greatest at C5-C6 and C6-C7. Multilevel advanced facet arthropathy bilaterally. No severe spinal canal narrowing. SOFT TISSUES: No prevertebral soft tissue swelling.  IMPRESSION: 1. No acute abnormality of the cervical spine. Electronically signed by: Norman Gatlin MD 03/25/2024 10:55 PM EST RP Workstation: [id number]   CT Head Wo Contrast Result Date: 03/25/2024 EXAM: CT HEAD WITHOUT CONTRAST 03/25/2024 10:36:00 PM TECHNIQUE: CT of the head was performed without the administration of intravenous contrast. Automated exposure control, iterative reconstruction, and/or weight based adjustment of the mA/kV was utilized to reduce the radiation dose to as low as reasonably achievable. COMPARISON: Comparison to study dated 02/08/2024. CLINICAL HISTORY: Mental status change, unknown cause. FINDINGS: BRAIN AND VENTRICLES: No acute hemorrhage. No evidence of acute infarct. No hydrocephalus. No extra-axial collection. No mass effect or midline shift. Chronic microvascular ischemia and generalized atrophy. ORBITS: No acute abnormality. SINUSES: No acute abnormality. Debris in the ethmoid air cells and sphenoid sinuses. SOFT TISSUES AND SKULL: No acute soft tissue abnormality. No skull fracture. IMPRESSION: 1. No acute intracranial abnormality. Electronically signed by: Norman Gatlin MD 03/25/2024 10:52 PM EST RP Workstation: [id number]   DG Pelvis 1-2 Views Result Date: 03/25/2024 EXAM: 1 OR 2 VIEW(S) XRAY OF THE PELVIS 03/25/2024 10:22:07 PM COMPARISON: None available. CLINICAL HISTORY: [id number] Fall [id number] FINDINGS: BONES AND JOINTS: Spondylitic changes in the visualized lower lumbar spine. No focal osseous lesion. No no acute fracture or joint dislocation. SOFT TISSUES: Atherosclerotic vascular calcifications. IMPRESSION: 1. No acute  fracture. Electronically signed by: Norman Gatlin MD 03/25/2024 10:40 PM EST RP Workstation: [id number]   DG Chest 2 View Result Date: 03/25/2024 EXAM: 2 VIEW(S) XRAY OF THE CHEST 03/25/2024 10:22:07 PM COMPARISON: 02/08/2024 CLINICAL HISTORY: AMS FINDINGS: LUNGS AND PLEURA: No focal pulmonary opacity. No pleural effusion. No pneumothorax. HEART  AND MEDIASTINUM: Aortic atherosclerosis and tortuosity. No acute abnormality of the cardiac and mediastinal silhouettes. BONES AND SOFT TISSUES: Thoracic degenerative disc disease. IMPRESSION: 1. No acute cardiopulmonary findings. Electronically signed by: Norman Gatlin MD 03/25/2024 10:39 PM EST RP Workstation: [id number]   DG Abd 1 View Result Date: 03/24/2024 EXAM: 1 VIEW XRAY OF THE ABDOMEN 03/24/2024 04:15:00 AM COMPARISON: Portable abdomen film yesterday 10:39 am. CLINICAL HISTORY: [id number] Abdominal pain [id number] Abdominal pain. FINDINGS: BOWEL: Nonobstructive bowel gas pattern. SOFT TISSUES: Cholecystectomy clips are present. Pelvic phleboliths are noted. No opaque urinary calculi. BONES: Mild levoscoliosis and advanced degenerative change of the lumbar spine. LUNG BASES: Fibrotic change in the lung bases with cardiomegaly. IMPRESSION: 1. No acute abdominal abnormality identified. Electronically signed by: Francis Quam MD 03/24/2024 05:39 AM EST RP Workstation: [id number]   DG Abd 1 View Result Date: 03/23/2024 EXAM: 1 VIEW XRAY OF THE ABDOMEN 03/23/2024 10:41:00 AM COMPARISON: 09/22/2023 CLINICAL HISTORY: Abdominal pain FINDINGS: BOWEL: Nonobstructive bowel gas pattern. SOFT TISSUES: Cholecystectomy clips in place. Aortoiliac calcified atherosclerosis. Contrast medium observed in the urinary bladder, likely left over from yesterday's CT scan. No opaque urinary calculi. BONES: Advanced lumbar disc and endplate degeneration. No acute osseous abnormality. LUNGS: Stable streaky opacities at the lung bases. HEART AND MEDIASTINUM: Mild cardiomegaly. IMPRESSION: 1. No acute abdominal findings. 2. Advanced lumbar disc and endplate degeneration. 3. Aortoiliac calcified atherosclerosis. 4. Mild cardiomegaly. 5. Stable streaky opacities at the lung bases. Electronically signed by: Ryan Salvage MD 03/23/2024 04:14 PM EST RP Workstation: [id number]   CT ABDOMEN PELVIS W CONTRAST Result Date:  03/22/2024 CLINICAL DATA:  Clinical concern for bowel obstruction. EXAM: CT ABDOMEN AND PELVIS WITH CONTRAST TECHNIQUE: Multidetector CT imaging of the abdomen and pelvis was performed using the standard protocol following bolus administration of intravenous contrast. RADIATION DOSE REDUCTION: This exam was performed according to the departmental dose-optimization program which includes automated exposure control, adjustment of the mA and/or kV according to patient size and/or use of iterative reconstruction technique. CONTRAST:  80mL OMNIPAQUE  IOHEXOL  300 MG/ML  SOLN COMPARISON:  Unenhanced abdomen and pelvis CT, 03/20/2024. FINDINGS: Lower chest: Patchy ground-glass and reticular opacities noted at the lung bases, similar to the recent prior CT, consistent with chronic interstitial lung disease. Hepatobiliary: Unremarkable liver. Status post cholecystectomy. Mild chronic intrahepatic bile duct dilation. Pancreas: Unremarkable. No pancreatic ductal dilatation or surrounding inflammatory changes. Spleen: Normal in size without focal abnormality. Adrenals/Urinary Tract: Normal adrenal glands. Mild renal cortical thinning. Symmetric renal enhancement and excretion. No renal masses or stones. No hydronephrosis. Normal ureters. Bladder mostly decompressed, otherwise unremarkable. Stomach/Bowel: Rectum is significantly distended with stool. Mild perirectal fat haziness consistent with inflammation. Stomach mostly decompressed, otherwise unremarkable. Small bowel and colon are normal in caliber. No wall thickening. No inflammation. Multiple colonic diverticula mostly on the left without evidence of diverticulitis. Vascular/Lymphatic: Stable aortic atherosclerotic calcifications. No aneurysm. No enlarged lymph nodes. Reproductive: Status post hysterectomy. No adnexal masses. Other: No abdominal wall hernia or abnormality. No abdominopelvic ascites. Musculoskeletal: No acute fracture. Mild chronic compression fracture of  T11, stable from the prior CT. No bone lesion. Degenerative changes noted throughout the visualized spine. Skeletal structures are demineralized. IMPRESSION: 1. Significant rectal distention with  stool with mild adjacent perirectal fat inflammation. Findings consistent with proctitis in the proper clinical setting. 2. No other acute bowel abnormality. No evidence of bowel obstruction. 3. Colonic diverticula without diverticulitis. 4. Aortic atherosclerosis. Electronically Signed   By: Alm Parkins M.D.   On: 03/22/2024 14:21   DG Hip Unilat W or Wo Pelvis 2-3 Views Left Result Date: 03/22/2024 CLINICAL DATA:  Left hip pain.  No injury. EXAM: DG HIP (WITH OR WITHOUT PELVIS) 2-3V LEFT COMPARISON:  None Available. FINDINGS: No fracture or bone lesion. Hip joints are normally spaced and aligned. No significant arthropathic change. SI joints and pubic symphysis are also normally aligned. There are disc degenerative changes of the visualized lower lumbar spine. Skeletal structures are demineralized. Aortoiliac atherosclerotic calcifications. Soft tissues otherwise unremarkable. IMPRESSION: 1. No fracture or acute finding. No significant hip joint abnormality. Electronically Signed   By: Alm Parkins M.D.   On: 03/22/2024 13:48   CT Renal Stone Study Result Date: 03/20/2024 CLINICAL DATA:  Abdominal pain.  Renal stone suspected. EXAM: CT ABDOMEN AND PELVIS WITHOUT CONTRAST TECHNIQUE: MULTIDETECTOR CT IMAGING OF THE ABDOMEN AND PELVIS WAS PERFORMED FOLLOWING THE STANDARD PROTOCOL WITHOUT IV CONTRAST.: TECHNIQUE: MULTIDETECTOR CT IMAGING OF THE ABDOMEN AND PELVIS WAS PERFORMED FOLLOWING THE STANDARD PROTOCOL WITHOUT IV CONTRAST. RADIATION DOSE REDUCTION: This exam was performed according to the departmental dose-optimization program which includes automated exposure control, adjustment of the mA and/or kV according to patient size and/or use of iterative reconstruction technique. COMPARISON:  CT 01/25/2024  FINDINGS: Lower chest: Interstitial lung disease again noted lung bases. Hepatobiliary: Postcholecystectomy. Pancreas: Pancreas is normal. No ductal dilatation. No pancreatic inflammation. Spleen: Normal spleen Adrenals/urinary tract: No nephrolithiasis or ureterolithiasis. No obstructive uropathy. No bladder calculi. Stomach/Bowel: Stomach, small bowel, appendix, and cecum are normal. LEFT lateral abdominal wall hernia contains loop of nonobstructed small bowel. Several diverticula LEFT colon. No acute inflammation. Vascular/Lymphatic: Abdominal aorta is normal caliber with atherosclerotic calcification. There is no retroperitoneal or periportal lymphadenopathy. No pelvic lymphadenopathy. Reproductive: Post hysterectomy.  Adnexa unremarkable Other: No free fluid. Musculoskeletal: No aggressive osseous lesion. Healing RIGHT inferior pubic ramus fracture. IMPRESSION: 1. No nephrolithiasis ureterolithiasis or obstructive uropathy. 2. Stable LEFT lateral abdominal wall hernia containing loop of nonobstructed small bowel. 3. LEFT colon diverticulosis without diverticulitis Electronically Signed   By: Jackquline Boxer M.D.   On: 03/20/2024 15:04    Microbiology: Recent Results (from the past 240 hours)  Urine Culture     Status: Abnormal   Collection Time: 03/26/24 10:31 AM   Specimen: Urine, Random  Result Value Ref Range Status   Specimen Description   Final    URINE, RANDOM Performed at [facility], [address] [place], [place] [address]    Special Requests   Final    NONE Reflexed from [id number] Performed at [facility], [address], [place], [place] [address]    Culture (A)  Final    >=100,000 COLONIES/mL KLEBSIELLA AEROGENES >=100,000 COLONIES/mL ESCHERICHIA COLI    Report Status 03/29/2024 FINAL  Final   Organism ID, Bacteria KLEBSIELLA AEROGENES (A)  Final   Organism ID, Bacteria ESCHERICHIA COLI (A)  Final      Susceptibility   Klebsiella  aerogenes - MIC*    CEFEPIME <=0.12 SENSITIVE Sensitive     ERTAPENEM <=0.12 SENSITIVE Sensitive     CEFTRIAXONE  <=0.25 SENSITIVE Sensitive     CIPROFLOXACIN  <=0.06 SENSITIVE Sensitive     GENTAMICIN <=1 SENSITIVE Sensitive     NITROFURANTOIN  64 INTERMEDIATE Intermediate     TRIMETH /SULFA  <=20 SENSITIVE Sensitive     PIP/TAZO Value in next row Sensitive      <=4 SENSITIVEThis is a modified FDA-approved test that has been validated and its performance characteristics determined by the reporting laboratory.  This laboratory is certified under the Clinical Laboratory Improvement Amendments CLIA as qualified to perform high complexity clinical laboratory testing.    MEROPENEM Value in next row Sensitive      <=4 SENSITIVEThis is a modified FDA-approved test that has been validated and its performance characteristics determined by the reporting laboratory.  This laboratory is certified under the Clinical Laboratory Improvement Amendments CLIA as qualified to perform high complexity clinical laboratory testing.    * >=100,000 COLONIES/mL KLEBSIELLA AEROGENES   Escherichia coli - MIC*    AMPICILLIN Value in next row Sensitive      <=4 SENSITIVEThis is a modified FDA-approved test that has been validated and its performance characteristics determined by the reporting laboratory.  This laboratory is certified under the Clinical Laboratory Improvement Amendments CLIA as qualified to perform high complexity clinical laboratory testing.    CEFAZOLIN  (URINE) Value in next row Sensitive      2 SENSITIVEThis is a modified FDA-approved test that has been validated and its performance characteristics determined by the reporting laboratory.  This laboratory is certified under the Clinical Laboratory Improvement Amendments CLIA as qualified to perform high complexity clinical laboratory testing.    CEFEPIME Value in next row Sensitive      2 SENSITIVEThis is a modified FDA-approved test that has been validated and  its performance characteristics determined by the reporting laboratory.  This laboratory is certified under the Clinical Laboratory Improvement Amendments CLIA as qualified to perform high complexity clinical laboratory testing.    ERTAPENEM Value in next row Sensitive      2 SENSITIVEThis is a modified FDA-approved test that has been validated and its performance characteristics determined by the reporting laboratory.  This laboratory is certified under the Clinical Laboratory Improvement Amendments CLIA as qualified to perform high complexity clinical laboratory testing.    CEFTRIAXONE  Value in next row Sensitive      2 SENSITIVEThis is a modified FDA-approved test that has been validated and its performance characteristics determined by the reporting laboratory.  This laboratory is certified under the Clinical Laboratory Improvement Amendments CLIA as qualified to perform high complexity clinical laboratory testing.    CIPROFLOXACIN  Value in next row Sensitive      2 SENSITIVEThis is a modified FDA-approved test that has been validated and its performance characteristics determined by the reporting laboratory.  This laboratory is certified under the Clinical Laboratory Improvement Amendments CLIA as qualified to perform high complexity clinical laboratory testing.    GENTAMICIN Value in next row Sensitive      2 SENSITIVEThis is a modified FDA-approved test that has been validated and its performance characteristics determined by the reporting laboratory.  This laboratory is certified under the Clinical Laboratory Improvement Amendments CLIA as qualified to perform high complexity clinical laboratory testing.    NITROFURANTOIN Value in next row Sensitive      2 SENSITIVEThis is a modified FDA-approved test that has been validated and its performance characteristics determined by the reporting laboratory.  This laboratory is certified under the Clinical Laboratory Improvement Amendments CLIA as qualified  to perform high complexity clinical laboratory testing.    TRIMETH /SULFA  Value in next row Sensitive      2 SENSITIVEThis is a modified FDA-approved test  that has been validated and its performance characteristics determined by the reporting laboratory.  This laboratory is certified under the Clinical Laboratory Improvement Amendments CLIA as qualified to perform high complexity clinical laboratory testing.    AMPICILLIN/SULBACTAM Value in next row Sensitive      2 SENSITIVEThis is a modified FDA-approved test that has been validated and its performance characteristics determined by the reporting laboratory.  This laboratory is certified under the Clinical Laboratory Improvement Amendments CLIA as qualified to perform high complexity clinical laboratory testing.    PIP/TAZO Value in next row Sensitive      <=4 SENSITIVEThis is a modified FDA-approved test that has been validated and its performance characteristics determined by the reporting laboratory.  This laboratory is certified under the Clinical Laboratory Improvement Amendments CLIA as qualified to perform high complexity clinical laboratory testing.    MEROPENEM Value in next row Sensitive      <=4 SENSITIVEThis is a modified FDA-approved test that has been validated and its performance characteristics determined by the reporting laboratory.  This laboratory is certified under the Clinical Laboratory Improvement Amendments CLIA as qualified to perform high complexity clinical laboratory testing.    * >=100,000 COLONIES/mL ESCHERICHIA COLI     Labs: Basic Metabolic Panel: Recent Labs  Lab 03/23/24 0412 03/25/24 1030 03/25/24 2247 03/26/24 1216 03/27/24 0536  NA 134* 132* 134* 133* 134*  K 3.8 4.0 4.3 4.1 4.0  CL 101 99 99 101 101  CO2 22 22 25 22 23   GLUCOSE 102* 107* 116* 106* 109*  BUN 15 15 17 15 15   CREATININE 0.66 0.68 0.80 0.62 0.61  CALCIUM 8.7* 8.8* 9.0 8.9 8.8*   Liver Function Tests: Recent Labs  Lab 03/23/24 0412  03/25/24 1030 03/25/24 2247 03/26/24 1216 03/27/24 0536  AST 26 19 19 17 15   ALT 8 9 9 7 6   ALKPHOS 64 81 79 76 72  BILITOT 0.6 0.5 0.6 0.7 0.6  PROT 6.0* 6.2* 6.8 6.4* 6.2*  ALBUMIN 3.2* 3.3* 3.6 3.4* 3.2*   Recent Labs  Lab 03/26/24 1216  LIPASE 25   No results for input(s): AMMONIA in the last 168 hours. CBC: Recent Labs  Lab 03/23/24 0412 03/25/24 1030 03/25/24 2247 03/26/24 1045 03/27/24 0536  WBC 8.6 9.3 11.3* 8.7 7.8  NEUTROABS  --   --   --  6.2  --   HGB 11.7* 12.5 13.0 12.5 11.8*  HCT 35.1* 38.0 38.8 39.7 35.5*  MCV 92.1 93.8 91.3 97.5 92.9  PLT 259 301 312 261 282   Cardiac Enzymes: No results for input(s): CKTOTAL, CKMB, CKMBINDEX, TROPONINI in the last 168 hours. BNP: BNP (last 3 results) Recent Labs    07/06/23 1631  BNP 66.0    ProBNP (last 3 results) Recent Labs    05/11/23 1527  PROBNP 61.0    CBG: Recent Labs  Lab 03/25/24 2246 03/26/24 1044  GLUCAP 110* 111*       Signed:  Toribio Hummer MD.  [facility] 03/29/2024, 1:37 PM

## 2024-03-29 NOTE — Plan of Care (Signed)
  Problem: Education: Goal: Knowledge of General Education information will improve Description: Including pain rating scale, medication(s)/side effects and non-pharmacologic comfort measures Outcome: Progressing   Problem: Clinical Measurements: Goal: Ability to maintain clinical measurements within normal limits will improve Outcome: Progressing Goal: Will remain free from infection Outcome: Progressing Goal: Diagnostic test results will improve Outcome: Progressing Goal: Respiratory complications will improve Outcome: Progressing Goal: Cardiovascular complication will be avoided Outcome: Progressing   Problem: Health Behavior/Discharge Planning: Goal: Ability to manage health-related needs will improve Outcome: Progressing   Problem: Elimination: Goal: Will not experience complications related to bowel motility Outcome: Progressing Goal: Will not experience complications related to urinary retention Outcome: Progressing   Problem: Nutrition: Goal: Adequate nutrition will be maintained Outcome: Progressing   Problem: Pain Managment: Goal: General experience of comfort will improve and/or be controlled Outcome: Progressing   Problem: Safety: Goal: Ability to remain free from injury will improve Outcome: Progressing   Problem: Skin Integrity: Goal: Risk for impaired skin integrity will decrease Outcome: Progressing

## 2024-03-29 NOTE — TOC Transition Note (Signed)
 Transition of Care [facility]) - Discharge Note   Patient Details  Name: Susan Ford MRN: [id number] Date of Birth: 04/04/1930  Transition of [facility]) CM/SW Contact:  Doneta Glenys DASEN, RN Phone Number: 03/29/2024, 3:10 PM   Clinical Narrative:     Per MD patient ready for DC to [facility] . RN to call report prior to discharge [phone] Rm [phone]). RN, patient, patient's family, and facility notified of DC. Discharge Summary and FL2 sent to facility vis [facility]. DC packet on chart includes face sheet, medical necessity, DNR and discharge summary. Ambulance [facility] transport requested for patient.  Please consult us  again if new needs arise.    Final next level of care: Skilled Nursing Facility Barriers to Discharge: Barriers Resolved   Patient Goals and CMS Choice Patient states their goals for this hospitalization and ongoing recovery are:: SNF CMS Medicare.gov Compare Post Acute Care list provided to:: Patient Represenative (must comment) Beverlie (son)) Choice offered to / list presented to : Adult Children [facility] ownership interest in [facility].provided to:: Adult Children    Discharge Placement   Existing PASRR number confirmed : 03/29/24            Patient to be transferred to facility by: [facility] Name of family member notified: [name] Patient and family notified of of transfer: 03/29/24  Discharge Plan and Services Additional resources added to the After Visit Summary for   In-house Referral: NA Discharge Planning Services: CM Consult Post Acute Care Choice: NA          DME Arranged: N/A DME Agency: NA       HH Arranged: NA HH Agency: NA     Representative spoke with at [facility] Agency: [name]  Social Drivers of Health (SDOH) Interventions SDOH Screenings   Food Insecurity: No Food Insecurity (03/26/2024)  Housing: Low Risk  (03/26/2024)  Transportation Needs: Unmet Transportation Needs (03/26/2024)  Utilities: Not At Risk (03/26/2024)   Alcohol  Screen: Low Risk  (06/15/2023)  Depression (PHQ2-9): Low Risk  (06/15/2023)  Financial Resource Strain: Low Risk  (06/15/2023)  Physical Activity: Inactive (06/15/2023)  Social Connections: Moderately Integrated (03/26/2024)  Stress: No Stress Concern Present (06/15/2023)  Tobacco Use: Medium Risk (03/26/2024)  Health Literacy: Adequate Health Literacy (06/15/2023)     Readmission Risk Interventions    03/27/2024   12:46 PM  Readmission Risk Prevention Plan  Transportation Screening Complete  Medication Review (RN Care Manager) Complete  PCP or Specialist appointment within 3-5 days of discharge Complete  HRI or Home Care Consult Complete  SW Recovery Care/Counseling Consult Complete  Palliative Care Screening Not Applicable  Skilled Nursing Facility Complete

## 2024-03-29 NOTE — Progress Notes (Signed)
 Called and spoke with Control And Instrumentation Engineer at [facility]. Report given and all questions answered. Kendra aware [facility] has been called and patient is third on the list.

## 2024-04-02 DIAGNOSIS — M6281 Muscle weakness (generalized): Secondary | ICD-10-CM | POA: Diagnosis not present

## 2024-04-02 DIAGNOSIS — G3184 Mild cognitive impairment, so stated: Secondary | ICD-10-CM | POA: Diagnosis not present

## 2024-04-02 DIAGNOSIS — F4320 Adjustment disorder, unspecified: Secondary | ICD-10-CM | POA: Diagnosis not present

## 2024-04-02 DIAGNOSIS — F0390 Unspecified dementia without behavioral disturbance: Secondary | ICD-10-CM | POA: Diagnosis not present

## 2024-04-02 DIAGNOSIS — G609 Hereditary and idiopathic neuropathy, unspecified: Secondary | ICD-10-CM | POA: Diagnosis not present

## 2024-04-02 DIAGNOSIS — N390 Urinary tract infection, site not specified: Secondary | ICD-10-CM | POA: Diagnosis not present

## 2024-04-04 DIAGNOSIS — M6281 Muscle weakness (generalized): Secondary | ICD-10-CM | POA: Diagnosis not present

## 2024-04-04 DIAGNOSIS — N390 Urinary tract infection, site not specified: Secondary | ICD-10-CM | POA: Diagnosis not present

## 2024-04-04 DIAGNOSIS — G609 Hereditary and idiopathic neuropathy, unspecified: Secondary | ICD-10-CM | POA: Diagnosis not present

## 2024-04-04 DIAGNOSIS — F0390 Unspecified dementia without behavioral disturbance: Secondary | ICD-10-CM | POA: Diagnosis not present

## 2024-04-11 DIAGNOSIS — F0390 Unspecified dementia without behavioral disturbance: Secondary | ICD-10-CM | POA: Diagnosis not present

## 2024-04-11 DIAGNOSIS — G609 Hereditary and idiopathic neuropathy, unspecified: Secondary | ICD-10-CM | POA: Diagnosis not present

## 2024-04-11 DIAGNOSIS — N390 Urinary tract infection, site not specified: Secondary | ICD-10-CM | POA: Diagnosis not present

## 2024-04-11 DIAGNOSIS — M6281 Muscle weakness (generalized): Secondary | ICD-10-CM | POA: Diagnosis not present

## 2024-04-16 NOTE — Progress Notes (Cosign Needed)
 Respiratory panel swab was obtained as patient has a result of triage assessment orders.  Obtained at triage secondary to patient complaints of weakness and rule out of possible viral etiology.

## 2024-05-05 ENCOUNTER — Emergency Department (HOSPITAL_COMMUNITY)

## 2024-05-05 ENCOUNTER — Other Ambulatory Visit: Payer: Self-pay

## 2024-05-05 ENCOUNTER — Inpatient Hospital Stay (HOSPITAL_COMMUNITY)
Admission: EM | Admit: 2024-05-05 | Discharge: 2024-05-08 | DRG: 552 | Disposition: A | Discharge status: Home Health | Attending: Internal Medicine | Admitting: Internal Medicine

## 2024-05-05 DIAGNOSIS — G609 Hereditary and idiopathic neuropathy, unspecified: Secondary | ICD-10-CM | POA: Diagnosis present

## 2024-05-05 DIAGNOSIS — Z9049 Acquired absence of other specified parts of digestive tract: Secondary | ICD-10-CM

## 2024-05-05 DIAGNOSIS — W19XXXA Unspecified fall, initial encounter: Secondary | ICD-10-CM | POA: Diagnosis present

## 2024-05-05 DIAGNOSIS — Z881 Allergy status to other antibiotic agents status: Secondary | ICD-10-CM

## 2024-05-05 DIAGNOSIS — I959 Hypotension, unspecified: Secondary | ICD-10-CM | POA: Diagnosis not present

## 2024-05-05 DIAGNOSIS — M5442 Lumbago with sciatica, left side: Secondary | ICD-10-CM | POA: Diagnosis present

## 2024-05-05 DIAGNOSIS — M48061 Spinal stenosis, lumbar region without neurogenic claudication: Principal | ICD-10-CM | POA: Diagnosis present

## 2024-05-05 DIAGNOSIS — Z66 Do not resuscitate: Secondary | ICD-10-CM | POA: Diagnosis present

## 2024-05-05 DIAGNOSIS — Z85828 Personal history of other malignant neoplasm of skin: Secondary | ICD-10-CM

## 2024-05-05 DIAGNOSIS — Z888 Allergy status to other drugs, medicaments and biological substances status: Secondary | ICD-10-CM

## 2024-05-05 DIAGNOSIS — M5459 Other low back pain: Secondary | ICD-10-CM | POA: Diagnosis present

## 2024-05-05 DIAGNOSIS — Z86711 Personal history of pulmonary embolism: Secondary | ICD-10-CM

## 2024-05-05 DIAGNOSIS — F0390 Unspecified dementia without behavioral disturbance: Secondary | ICD-10-CM | POA: Diagnosis present

## 2024-05-05 DIAGNOSIS — Z90710 Acquired absence of both cervix and uterus: Secondary | ICD-10-CM

## 2024-05-05 DIAGNOSIS — Z87891 Personal history of nicotine dependence: Secondary | ICD-10-CM

## 2024-05-05 DIAGNOSIS — I10 Essential (primary) hypertension: Secondary | ICD-10-CM | POA: Diagnosis present

## 2024-05-05 DIAGNOSIS — Z79899 Other long term (current) drug therapy: Secondary | ICD-10-CM

## 2024-05-05 DIAGNOSIS — G8929 Other chronic pain: Principal | ICD-10-CM | POA: Diagnosis present

## 2024-05-05 DIAGNOSIS — E785 Hyperlipidemia, unspecified: Secondary | ICD-10-CM | POA: Diagnosis present

## 2024-05-05 LAB — CBC WITH DIFFERENTIAL/PLATELET
Abs Immature Granulocytes: 0.01 K/uL (ref 0.00–0.07)
Basophils Absolute: 0.1 K/uL (ref 0.0–0.1)
Basophils Relative: 1 %
Eosinophils Absolute: 0.4 K/uL (ref 0.0–0.5)
Eosinophils Relative: 5 %
HCT: 38.6 % (ref 36.0–46.0)
Hemoglobin: 12.9 g/dL (ref 12.0–15.0)
Immature Granulocytes: 0 %
Lymphocytes Relative: 19 %
Lymphs Abs: 1.3 K/uL (ref 0.7–4.0)
MCH: 30.2 pg (ref 26.0–34.0)
MCHC: 33.4 g/dL (ref 30.0–36.0)
MCV: 90.4 fL (ref 80.0–100.0)
Monocytes Absolute: 0.7 K/uL (ref 0.1–1.0)
Monocytes Relative: 10 %
Neutro Abs: 4.5 K/uL (ref 1.7–7.7)
Neutrophils Relative %: 65 %
Platelets: 236 K/uL (ref 150–400)
RBC: 4.27 MIL/uL (ref 3.87–5.11)
RDW: 12.8 % (ref 11.5–15.5)
WBC: 7 K/uL (ref 4.0–10.5)
nRBC: 0 % (ref 0.0–0.2)

## 2024-05-05 LAB — BASIC METABOLIC PANEL WITH GFR
Anion gap: 10 (ref 5–15)
BUN: 10 mg/dL (ref 8–23)
CO2: 24 mmol/L (ref 22–32)
Calcium: 9.3 mg/dL (ref 8.9–10.3)
Chloride: 100 mmol/L (ref 98–111)
Creatinine, Ser: 0.62 mg/dL (ref 0.44–1.00)
GFR, Estimated: >60 mL/min
Glucose, Bld: 102 mg/dL — ABNORMAL HIGH (ref 70–99)
Potassium: 4.2 mmol/L (ref 3.5–5.1)
Sodium: 133 mmol/L — ABNORMAL LOW (ref 135–145)

## 2024-05-05 LAB — URINALYSIS, ROUTINE W REFLEX MICROSCOPIC
Bilirubin Urine: NEGATIVE
Glucose, UA: NEGATIVE mg/dL
Hgb urine dipstick: NEGATIVE
Ketones, ur: NEGATIVE mg/dL
Leukocytes,Ua: NEGATIVE
Nitrite: NEGATIVE
Protein, ur: NEGATIVE mg/dL
Specific Gravity, Urine: 1.015 (ref 1.005–1.030)
pH: 7 (ref 5.0–8.0)

## 2024-05-05 MED ORDER — SENNA 8.6 MG PO TABS
1.0000 | ORAL_TABLET | Freq: Two times a day (BID) | ORAL | Status: DC
  Administered 2024-05-05: 8.6 mg via ORAL
  Filled 2024-05-05 (×5): qty 1

## 2024-05-05 MED ORDER — ACETAMINOPHEN 650 MG RE SUPP: 650.0000 mg | Freq: Four times a day (QID) | RECTAL | Status: DC | PRN

## 2024-05-05 MED ORDER — SIMVASTATIN 20 MG PO TABS
10.0000 mg | ORAL_TABLET | Freq: Every day | ORAL | Status: DC
  Administered 2024-05-05 – 2024-05-08 (×2): 10 mg via ORAL
  Filled 2024-05-05 (×3): qty 1

## 2024-05-05 MED ORDER — ONDANSETRON HCL 4 MG PO TABS: 4.0000 mg | ORAL_TABLET | Freq: Four times a day (QID) | ORAL | Status: DC | PRN

## 2024-05-05 MED ORDER — MORPHINE SULFATE (PF) 2 MG/ML IV SOLN: 2.0000 mg | INTRAVENOUS | Status: DC | PRN

## 2024-05-05 MED ORDER — GABAPENTIN 300 MG PO CAPS
300.0000 mg | ORAL_CAPSULE | Freq: Every day | ORAL | Status: DC
  Administered 2024-05-05 – 2024-05-07 (×3): 300 mg via ORAL
  Filled 2024-05-05 (×3): qty 1

## 2024-05-05 MED ORDER — POLYETHYLENE GLYCOL 3350 17 G PO PACK: 17.0000 g | PACK | Freq: Every day | ORAL | Status: DC | PRN

## 2024-05-05 MED ORDER — ONDANSETRON HCL 4 MG/2ML IJ SOLN: 4.0000 mg | Freq: Four times a day (QID) | INTRAMUSCULAR | Status: DC | PRN

## 2024-05-05 MED ORDER — QUETIAPINE FUMARATE 25 MG PO TABS
12.5000 mg | ORAL_TABLET | Freq: Every day | ORAL | Status: DC
  Administered 2024-05-06: 12.5 mg via ORAL
  Filled 2024-05-05 (×2): qty 1

## 2024-05-05 MED ORDER — TRAMADOL HCL 50 MG PO TABS
50.0000 mg | ORAL_TABLET | Freq: Once | ORAL | Status: AC
  Administered 2024-05-05: 50 mg via ORAL
  Filled 2024-05-05: qty 1

## 2024-05-05 MED ORDER — OXYCODONE HCL 5 MG PO TABS
2.5000 mg | ORAL_TABLET | ORAL | Status: DC | PRN
  Filled 2024-05-05 (×2): qty 1

## 2024-05-05 MED ORDER — ACETAMINOPHEN 325 MG PO TABS
650.0000 mg | ORAL_TABLET | Freq: Four times a day (QID) | ORAL | Status: DC | PRN
  Filled 2024-05-05 (×2): qty 2

## 2024-05-05 MED ORDER — KETOROLAC TROMETHAMINE 15 MG/ML IJ SOLN
15.0000 mg | Freq: Once | INTRAMUSCULAR | Status: AC
  Administered 2024-05-05: 15 mg via INTRAVENOUS
  Filled 2024-05-05: qty 1

## 2024-05-05 MED ORDER — ENOXAPARIN SODIUM 40 MG/0.4ML IJ SOSY
40.0000 mg | PREFILLED_SYRINGE | INTRAMUSCULAR | Status: DC
  Filled 2024-05-05 (×3): qty 0.4

## 2024-05-05 NOTE — ED Provider Notes (Addendum)
 " [place] EMERGENCY DEPARTMENT AT [facility] Provider Note   CSN: [id number] Arrival date & time: 05/05/24  1229     Patient presents with: Back Pain   Susan Ford is a 89 y.o. female.    Back Pain    89 year old female with medical history significant for dementia, PE not on anticoagulation, chronic bilateral low back pain without sciatica presenting to the emergency department with worsening pain in her low back.  The patient can normally ambulate with assistance but has been having difficulty ambulating over the past few days.  She presents from [facility] and has been able to ambulate for the past few days.  Facility had also noted decreased urine output.  Per nursing staff the patient did have a fall roughly 1 week ago.  The patient endorses 8 out of 10 pain in the low back, some radiation down her left lower extremity.  She denies any numbness or weakness.  Remainder of HPI is limited by the patient's dementia.  Prior to Admission medications  Medication Sig Start Date End Date Taking? Authorizing Provider  acetaminophen  (TYLENOL ) 325 MG tablet Take 650 mg by mouth every 6 (six) hours as needed for moderate pain (pain score 4-6) or mild pain (pain score 1-3).    [provider]  Ascorbic Acid  (VITAMIN C ) 1000 MG tablet Take 1,000 mg by mouth daily.    [provider]  gabapentin  (NEURONTIN ) 300 MG capsule TAKE 1 CAPSULE BY MOUTH EVERYDAY AT BEDTIME Patient taking differently: Take 300 mg by mouth daily as needed (FOR PAIN). 11/14/23   Panosh, Wanda K, MD  Multiple Vitamin (MULTIVITAMIN) tablet Take 1 tablet by mouth daily after lunch.    [provider]  PREVAGEN EXTRA STRENGTH 20 MG CAPS Take 20 mg by mouth daily.    [provider]  QUEtiapine  (SEROQUEL ) 25 MG tablet Take 0.5 tablets (12.5 mg total) by mouth at bedtime. 03/29/24   Sebastian Toribio GAILS, MD  senna (SENOKOT) 8.6 MG TABS tablet Take 2 tablets (17.2 mg total) by  mouth at bedtime. 03/25/24   Will Almarie MATSU, MD  simvastatin  (ZOCOR ) 10 MG tablet TAKE 1 TABLET BY MOUTH EVERY DAY Patient not taking: Reported on 03/27/2024 02/15/24   Panosh, [name], MD    Allergies: Lyrica [pregabalin], Oxytetracycline, and Memantine     Review of Systems  Unable to perform ROS: Dementia  Musculoskeletal:  Positive for back pain.    Updated Vital Signs BP (!) 163/89 (BP Location: Left Arm)   Pulse 79   Temp 97.9 F (36.6 C) (Oral)   Resp 19   SpO2 96%   Physical Exam Vitals and nursing note reviewed.  Constitutional:      General: She is not in acute distress. HENT:     Head: Normocephalic and atraumatic.  Eyes:     Conjunctiva/sclera: Conjunctivae normal.     Pupils: Pupils are equal, round, and reactive to light.  Cardiovascular:     Rate and Rhythm: Normal rate and regular rhythm.  Pulmonary:     Effort: Pulmonary effort is normal. No respiratory distress.  Abdominal:     General: There is no distension.     Tenderness: There is no guarding.  Musculoskeletal:        General: No deformity or signs of injury.     Cervical back: Neck supple.     Comments: TTP of the lower lumbar spine, positive straight leg raise on the left  Skin:  Findings: No lesion or rash.  Neurological:     General: No focal deficit present.     Mental Status: She is alert. Mental status is at baseline.     Comments: 5/5 strength in the bilateral lower extremities, intact sensation to light touch     (all labs ordered are listed, but only abnormal results are displayed) Labs Reviewed  BASIC METABOLIC PANEL WITH GFR - Abnormal; Notable for the following components:      Result Value   Sodium 133 (*)    Glucose, Bld 102 (*)    All other components within normal limits  CBC WITH DIFFERENTIAL/PLATELET  URINALYSIS, ROUTINE W REFLEX MICROSCOPIC    EKG: EKG Interpretation Date/Time:  Saturday May 05 2024 12:39:57 EST Ventricular Rate:  72 PR  Interval:  195 QRS Duration:  99 QT Interval:  406 QTC Calculation: 445 R Axis:   -18  Text Interpretation: Sinus rhythm Atrial premature complexes Borderline left axis deviation Low voltage, extremity and precordial leads RSR' in V1 or V2, probably normal variant No significant change since last tracing Confirmed by Jerrol Agent ([id number]) on 05/05/2024 1:06:29 PM  Radiology: CT Lumbar Spine Wo Contrast Result Date: 05/05/2024 EXAM: CT OF THE LUMBAR SPINE WITHOUT CONTRAST 05/05/2024 01:59:07 PM TECHNIQUE: CT of the lumbar spine was performed without the administration of intravenous contrast. Multiplanar reformatted images are provided for review. Automated exposure control, iterative reconstruction, and/or weight based adjustment of the mA/kV was utilized to reduce the radiation dose to as low as reasonably achievable. COMPARISON: CT abdomen and pelvis 03/26/2024. MRI lumbar spine 07/29/2016. CLINICAL HISTORY: Back trauma, no prior imaging. Worsening back pain with inability to ambulate and decreased urine output. FINDINGS: BONES AND ALIGNMENT: 5 lumbar type vertebrae. Mild levoscoliosis near the thoracolumbar junction and mild lower thoracic dextroscoliosis. No substantial listhesis. Diffuse osteopenia. No acute lumbar spine fracture or destructive lesion. Mild chronic depression of the T12 superior endplate on the left. DEGENERATIVE CHANGES: Widespread chronic degenerative endplate changes and advanced disc space narrowing throughout the lumbar spine. Previous left laminectomy at L4-L5 and L5-S1. Mild to moderate spinal stenosis at L3-L4 due to disc bulging and posterior element hypertrophy. Moderate bilateral neural foraminal stenosis at L4-L5 and severe left greater than right neural foraminal stenosis at L5-S1, similar to the prior MRI. SOFT TISSUES: Cholecystectomy. Abdominal aortic atherosclerosis without aneurysm. Colonic diverticulosis. IMPRESSION: 1. No acute lumbar spine fracture. 2. Diffuse  lumbar disc and facet degeneration with mild to moderate spinal stenosis at L3-L4. 3. Moderate neural foraminal stenosis at L4-L5 and severe foraminal stenosis at L5-S1. Electronically signed by: Dasie Hamburg MD MD 05/05/2024 02:48 PM EST RP Workstation: [id number]     Procedures   Medications Ordered in the ED  traMADol  (ULTRAM ) tablet 50 mg (50 mg Oral Given 05/05/24 1506)                                    Medical Decision Making Amount and/or Complexity of Data Reviewed Labs: ordered. Radiology: ordered.  Risk Prescription drug management. Decision regarding hospitalization.   89 year old female with medical history significant for dementia, PE not on anticoagulation, chronic bilateral low back pain without sciatica presenting to the emergency department with worsening pain in her low back.  The patient can normally ambulate with assistance but has been having difficulty ambulating over the past few days.  She presents from [facility] and has been able to ambulate for the past  few days.  Facility had also noted decreased urine output.  Per nursing staff the patient did have a fall roughly 1 week ago.  The patient endorses 8 out of 10 pain in the low back, some radiation down her left lower extremity.  She denies any numbness or weakness.  Remainder of HPI is limited by the patient's dementia.   On arrival, the patient was vitally stable. On my initial exam, the pt was with an intact neurologic exam, tolerating PO intake, with a positive straight leg raise on the left. Patient had midline TTP of the spine.    Reviewed and confirmed nursing documentation for past medical history, family history, social history.    Initial Assessment:   With the patient's presentation of acute back pain in the above setting, most likely diagnosis is musculoskeletal strain. Other diagnoses were considered including (but not limited to) underlying fracture, epidural hematoma, cauda equina syndrome,  spinal stenosis, spinal malignancy. These are considered less likely due to history of present illness and physical exam findings.   In particular, lack of fever, substantial history of IV drug use, or substantial neurologic abnormality is less consistent with epidural abscess versus discitis or other spinal infection.   Initial Plan:  Multimodal pain control described and patient informed on safe usage.  Screening evaluation including below radiographic evaluation reviewed  Patient stable for continued outpatient evaluation and management of their musculoskeletal pains.  Patient referred back to primary care provider for continued evaluation and management.   Initial Study Results:   Radiology  CT Lumbar Spine Wo Contrast  Final Result      IMPRESSION:  1. No acute lumbar spine fracture.  2. Diffuse lumbar disc and facet degeneration with mild to moderate spinal  stenosis at L3-L4.  3. Moderate neural foraminal stenosis at L4-L5 and severe foraminal stenosis at  L5-S1.   Labs: BMP unremarkable, CBC unremarkable, urinalysis negative for hematuria or UTI.  The patient was administered tramadol  in the emergency department for likely back pain associated with her known vital stenosis.  No concern for cauda equina syndrome at this time, patient intact strength and sensation and negative urinary fecal incontinence, no saddle anesthesia, intact neurologically.  After tramadol  administration, attempts were made to ambulate the patient line at her baseline with some assistance and the patient was unable to get out of bed due to severe pain.  In the setting of this, medicine was consulted for admission for further pain management and work with PT. Dr. Celinda was consulted and accepted the patient in admission.     Emergency Department Medication Summary:   Medications  traMADol  (ULTRAM ) tablet 50 mg (50 mg Oral Given 05/05/24 1506)         Final diagnoses:  Chronic low back pain with  left-sided sciatica, unspecified back pain laterality    ED Discharge Orders     None          Jerrol Agent, MD 05/05/24 1641    Jerrol Agent, MD 05/05/24 1642  "

## 2024-05-05 NOTE — ED Triage Notes (Signed)
 BIBA from [facility] for increasing pain in her back. Pt normally can ambulate with assistance, but has been unable to for the last few days- facility also reports she has had decreased urine output. VSS

## 2024-05-05 NOTE — ED Notes (Signed)
 Pt unable to ambulate with her walker due to pain

## 2024-05-05 NOTE — H&P (Signed)
 " History and Physical    Patient: Susan Ford FMW:[id number] DOB: Apr 23, 1930 DOA: 05/05/2024 DOS: the patient was seen and examined on 05/05/2024 PCP: Charlett [name], MD  Patient coming from: Home  Chief Complaint:  Chief Complaint  Patient presents with   Back Pain   HPI: Susan Ford is a 89 y.o. female with medical history significant of  osteoarthritis, hyperlipidemia, bilateral cataracts, choledocholithiasis, cholecystitis, history of ERCP, cholecystectomy, single episode transient postcholecystectomy A-fib, history of a flutter, history of anticoagulation, history of GI bleed, GERD, hereditary and idiopathic peripheral neuropathy, history of pulmonary embolism who was brought from her facility ([facility]) via EMS due to exacerbation of her back pain. She usually is able to ambulate with assistance, but has being unable to do so in the last few days. She has also had decreased urinary output.  No history of trauma.  Denied fever, chills, rhinorrhea, sore throat, wheezing or hemoptysis. No chest pain, palpitations, diaphoresis, PND, orthopnea or pitting edema of the lower extremities. No abdominal pain, nausea, emesis, diarrhea, constipation, melena or hematochezia. No flank pain, dysuria, frequency or hematuria. No polyuria, polydipsia, polyphagia or blurred vision.   Lab work: Urinalysis and CBC were normal.  BMP showed a sodium 133 mmol/L and glucose of 102 mg/dL but all other measurements were normal.  Imaging: CT lumbar spine without contrast with no acute lumbar spine fracture.  There is diffuse lumbar disc and facet degeneration with mild to moderate spinal stenosis L3-L4.  Moderate neuroforaminal stenosis at L4-L5 and severe foraminal stenosis at L5-S1.   ED course: Initial vital signs were temperature 97.9 F, pulse 72, respiration 17, BP 152/77 mmHg and O2 sat 96% on room air.  The patient received a tablet of tramadol  50 mg p.o. x 1.  While after this, she tried to  ambulate and was unable to do so.  Review of Systems: As mentioned in the history of present illness. All other systems reviewed and are negative. Past Medical History:  Diagnosis Date   Arthritis    Cataract 04/26/2005   bilateral cat ext    Cholecystitis    Common bile duct stone 03/22/2024   Complication of anesthesia 02/21/2014   atrial fib after cholecystectomy, lasted a few hours, none since   Dementia without behavioral disturbance (HCC) 10/03/2017   Dysrhythmia 02/21/2014   after cholecystectomy had atrial fib for a few hours, none since   GERD (gastroesophageal reflux disease)    Had endoscopy no history of Barrett's   Hx of nonmelanoma skin cancer    Hyperlipidemia 10/23/2013   On simva no   No se of med      Pulmonary embolism (HCC) 12/28/2022   Lll no strain  9 2024 hosp      Unspecified hereditary and idiopathic peripheral neuropathy    Past Surgical History:  Procedure Laterality Date   ABDOMINAL HYSTERECTOMY  15 yrs ago   complete   BIOPSY  07/09/2022   Procedure: BIOPSY;  Surgeon: Elicia Claw, MD;  Location: [facility];  Service: Gastroenterology;;   BIOPSY  04/13/2023   Procedure: BIOPSY;  Surgeon: Rosalie Kitchens, MD;  Location: [facility];  Service: Gastroenterology;;   BREAST CYST ASPIRATION     CHOLECYSTECTOMY N/A 02/21/2014   Procedure: LAPAROSCOPIC CHOLECYSTECTOMY WITH INTRAOPERATIVE CHOLANGIOGRAM;  Surgeon: Gordy Pina, MD;  Location: [facility] OR;  Service: General;  Laterality: N/A;   ENDOSCOPIC RETROGRADE CHOLANGIOPANCREATOGRAPHY (ERCP) WITH PROPOFOL  N/A 05/29/2014   Procedure: ENDOSCOPIC RETROGRADE CHOLANGIOPANCREATOGRAPHY (ERCP) WITH PROPOFOL ;  Surgeon: Elsie  Burnette, MD;  Location: [facility];  Service: Endoscopy;  Laterality: N/A;   ERCP N/A 02/24/2014   Procedure: ENDOSCOPIC RETROGRADE CHOLANGIOPANCREATOGRAPHY (ERCP);  Surgeon: Elsie Burnette, MD;  Location: [facility] END[facility]COPY;  Service: Endoscopy;  Laterality: N/A;   ESOPHAGOGASTRODUODENOSCOPY (EGD) WITH  PROPOFOL  N/A 07/09/2022   Procedure: ESOPHAGOGASTRODUODENOSCOPY (EGD) WITH PROPOFOL ;  Surgeon: Elicia Claw, MD;  Location: [facility];  Service: Gastroenterology;  Laterality: N/A;   ESOPHAGOGASTRODUODENOSCOPY (EGD) WITH PROPOFOL  N/A 04/13/2023   Procedure: ESOPHAGOGASTRODUODENOSCOPY (EGD) WITH PROPOFOL ;  Surgeon: Rosalie Kitchens, MD;  Location: [facility];  Service: Gastroenterology;  Laterality: N/A;   EYE SURGERY Bilateral 2007   both eyes cataracts with lens replacments   LUMBAR LAMINECTOMY/DECOMPRESSION MICRODISCECTOMY  02/01/2012   Procedure: LUMBAR LAMINECTOMY/DECOMPRESSION MICRODISCECTOMY 2 LEVELS;  Surgeon: Victory Gens, MD;  Location: [facility];  Service: Neurosurgery;  Laterality: Left;  Left Lumbar Four-Five Lumbar Five-Sacral One Laminectomies/Foraminotomies/Microscope   SPYGLASS CHOLANGIOSCOPY N/A 05/29/2014   Procedure: SPYGLASS CHOLANGIOSCOPY;  Surgeon: Elsie Burnette, MD;  Location: [facility];  Service: Endoscopy;  Laterality: N/A;   TONSILLECTOMY  age 4   Social History:  reports that she quit smoking about 61 years ago. Her smoking use included cigarettes. She started smoking about 68 years ago. She has a 1.8 pack-year smoking history. She has never used smokeless tobacco. She reports current alcohol  use of about 7.0 standard drinks of alcohol  per week. She reports that she does not use drugs.  Allergies[1]  Family History  Problem Relation Age of Onset   Neurodegenerative disease Mother        Shy Drager died at 80   Alcohol  abuse Other    Heart disease Other     Prior to Admission medications  Medication Sig Start Date End Date Taking? Authorizing Provider  acetaminophen  (TYLENOL ) 325 MG tablet Take 650 mg by mouth every 6 (six) hours as needed for moderate pain (pain score 4-6) or mild pain (pain score 1-3).    [provider]  Ascorbic Acid  (VITAMIN C ) 1000 MG tablet Take 1,000 mg by mouth daily.    [provider]  gabapentin  (NEURONTIN )  300 MG capsule TAKE 1 CAPSULE BY MOUTH EVERYDAY AT BEDTIME Patient taking differently: Take 300 mg by mouth daily as needed (FOR PAIN). 11/14/23   Panosh, Wanda K, MD  Multiple Vitamin (MULTIVITAMIN) tablet Take 1 tablet by mouth daily after lunch.    [provider]  PREVAGEN EXTRA STRENGTH 20 MG CAPS Take 20 mg by mouth daily.    [provider]  QUEtiapine  (SEROQUEL ) 25 MG tablet Take 0.5 tablets (12.5 mg total) by mouth at bedtime. 03/29/24   Sebastian Toribio GAILS, MD  senna (SENOKOT) 8.6 MG TABS tablet Take 2 tablets (17.2 mg total) by mouth at bedtime. 03/25/24   Will Almarie MATSU, MD  simvastatin  (ZOCOR ) 10 MG tablet TAKE 1 TABLET BY MOUTH EVERY DAY Patient not taking: Reported on 03/27/2024 02/15/24   Charlett [name], MD    Physical Exam: Vitals:   05/05/24 1235 05/05/24 1330 05/05/24 1600  BP: (!) 152/77 (!) 160/76 (!) 163/89  Pulse: 72 71 79  Resp: 17 18 19   Temp: 97.9 F (36.6 C)    TempSrc: Oral    SpO2: 96% 98% 96%   Physical Exam Vitals reviewed.  Constitutional:      Appearance: She is ill-appearing.  HENT:     Head: Normocephalic.     Nose: No rhinorrhea.     Mouth/Throat:     Mouth: Mucous membranes are moist.  Eyes:     General: No scleral icterus.    Pupils: Pupils are equal, round, and reactive to light.  Cardiovascular:     Rate and Rhythm: Normal rate and regular rhythm.  Pulmonary:     Effort: Pulmonary effort is normal.     Breath sounds: Normal breath sounds. No wheezing, rhonchi or rales.  Abdominal:     General: Bowel sounds are normal. There is no distension.     Palpations: Abdomen is soft.     Tenderness: There is no right CVA tenderness or left CVA tenderness.  Musculoskeletal:     Cervical back: Neck supple.     Lumbar back: Tenderness present. Decreased range of motion.     Right lower leg: No edema.     Left lower leg: No edema.  Skin:    General: Skin is warm and dry.  Neurological:     General: No focal deficit  present.     Mental Status: She is alert and oriented to person, place, and time.  Psychiatric:        Mood and Affect: Mood normal.        Behavior: Behavior normal.     Data Reviewed:  Results are pending, will review when available.  Assessment and Plan: Principal Problem:   Intractable low back pain Due to exacerbation of:   Chronic bilateral low back pain without sciatica Observation/MedSurg. Continue IV fluids. Analgesics as needed. - Acetaminophen  as needed. -Oxycodone  2.5 mg p.o. every 4 hours as needed. -Morphine  2 mg every 2 hours as needed for severe pain. Antiemetics as needed. Ketorolac  15 mg IVP x 1. Switch gabapentin  to 300 mg nightly. - Was 300 mg p.o. every day as needed Consult physical therapy in AM.  Active Problems:   Hereditary and idiopathic peripheral neuropathy Continue gabapentin  300 mg p.o. daily as needed.     Hyperlipidemia Continue simvastatin  10 mg p.o. daily.     Dementia without behavioral disturbance (HCC) Fully oriented at this time. Supportive care as needed.     Advance Care Planning:   Code Status: Limited: Do not attempt resuscitation (DNR) -DNR-LIMITED -Do Not Intubate/DNI    Consults:   Family Communication:   Severity of Illness: The appropriate patient status for this patient is OBSERVATION. Observation status is judged to be reasonable and necessary in order to provide the required intensity of service to ensure the patient's safety. The patient's presenting symptoms, physical exam findings, and initial radiographic and laboratory data in the context of their medical condition is felt to place them at decreased risk for further clinical deterioration. Furthermore, it is anticipated that the patient will be medically stable for discharge from the hospital within 2 midnights of admission.   Author: Alm Dorn Castor, MD 05/05/2024 4:35 PM  For on call review [email or web address].   This document was prepared using Dragon  voice recognition software and may contain some unintended transcription errors.     [1]  Allergies Allergen Reactions   Lyrica [Pregabalin] Other (See Comments)    High Fever    Oxytetracycline Other (See Comments)    High Fever caused by terramycin    Memantine  Rash   "

## 2024-05-06 ENCOUNTER — Encounter (HOSPITAL_COMMUNITY): Payer: Self-pay | Admitting: Internal Medicine

## 2024-05-06 DIAGNOSIS — M5459 Other low back pain: Secondary | ICD-10-CM | POA: Diagnosis not present

## 2024-05-06 MED ORDER — DICLOFENAC SODIUM 1 % EX GEL
4.0000 g | Freq: Four times a day (QID) | CUTANEOUS | Status: DC | PRN
  Administered 2024-05-06: 4 g via TOPICAL
  Filled 2024-05-06: qty 100

## 2024-05-06 MED ORDER — AMLODIPINE BESYLATE 5 MG PO TABS
5.0000 mg | ORAL_TABLET | Freq: Every day | ORAL | Status: DC
  Administered 2024-05-06: 5 mg via ORAL
  Filled 2024-05-06: qty 1

## 2024-05-06 MED ORDER — ACETAMINOPHEN 500 MG PO TABS
1000.0000 mg | ORAL_TABLET | Freq: Three times a day (TID) | ORAL | Status: DC
  Administered 2024-05-06 – 2024-05-08 (×4): 1000 mg via ORAL
  Filled 2024-05-06 (×5): qty 2

## 2024-05-06 MED ORDER — SODIUM CHLORIDE 0.9 % IV BOLUS
1000.0000 mL | Freq: Once | INTRAVENOUS | Status: AC
  Administered 2024-05-06: 1000 mL via INTRAVENOUS

## 2024-05-06 MED ORDER — LIDOCAINE 5 % EX PTCH
1.0000 | MEDICATED_PATCH | CUTANEOUS | Status: DC
  Administered 2024-05-06 – 2024-05-08 (×2): 1 via TRANSDERMAL
  Filled 2024-05-06 (×4): qty 1

## 2024-05-06 MED ORDER — SODIUM CHLORIDE 0.9 % IV SOLN: INTRAVENOUS | Status: DC

## 2024-05-06 MED ORDER — ENSURE PLUS HIGH PROTEIN PO LIQD
237.0000 mL | Freq: Three times a day (TID) | ORAL | Status: DC
  Administered 2024-05-06 – 2024-05-08 (×4): 237 mL via ORAL

## 2024-05-06 NOTE — Progress Notes (Signed)
 Given pm doses of home seroquel  and gabapentin - ~1 hour later pt with AMS and hypotension SBP 60-70 range. Poor PO intake past 24 hours so will give 1L NS then start IVFs at 125.hr- will continue to trend VS and mentation

## 2024-05-06 NOTE — Evaluation (Signed)
 Occupational Therapy Evaluation Patient Details Name: Susan Ford MRN: [id number] DOB: Jan 05, 1930 Today's Date: 05/06/2024   History of Present Illness   89 yo brought from her facility ([facility]) via EMS due to exacerbation of her back pain. PMH:  osteoarthritis, hyperlipidemia, bilateral cataracts, choledocholithiasis, cholecystitis, history of ERCP, cholecystectomy, single episode transient postcholecystectomy A-fib, history of a flutter, history of anticoagulation, history of GI bleed, GERD, hereditary and idiopathic peripheral neuropathy, history of pulmonary embolism.     Clinical Impressions Pt with recent hospital admission 12/3 with apparent DC to [facility]) for rehab - unsure how long pt has been at ILF/[facility] as pt is unable to give information. Pt anxious with increased RR asking to go home however required max encouragement to mobilize OOB. Required mod A with power up to stand and was able to take a few steps to the recliner. Overall mod A with LB ADL tasks at this time due to generalized weakness. Pt unaware of why she is in the situation, however did not complain of back pain during mobility. If pt has caregivers who can provide physical assistance with all mobility and ADL tasks, DC back to [facility] is an option. If not, Patient will benefit from continued inpatient follow up therapy, <3 hours/day. Acute OT to follow.  Pt shaking and stating she is cold - nsg made aware and will take temp/vitals. SpO2 >90 on RA during mobility.      If plan is discharge home, recommend the following:   A lot of help with walking and/or transfers;A lot of help with bathing/dressing/bathroom     Functional Status Assessment   Patient has had a recent decline in their functional status and demonstrates the ability to make significant improvements in function in a reasonable and predictable amount of time.     Equipment Recommendations   None recommended by OT      Recommendations for Other Services         Precautions/Restrictions   Precautions Precautions: Fall Recall of Precautions/Restrictions:  (watch O2 sats)     Mobility Bed Mobility Overal bed mobility: Needs Assistance Bed Mobility: Supine to Sit     Supine to sit: Min assist; heavy use of rails as well as physical assist          Transfers Overall transfer level: Needs assistance Equipment used: Rolling walker (2 wheels) Transfers: Sit to/from Stand, Bed to chair/wheelchair/BSC Sit to Stand: Mod assist     Step pivot transfers: Mod assist     General transfer comment: moderate vc to prevent fall - pt attempting to sit before being safely oriented to chair; stands by pulling up on RW; keeps walker further away; may use a rollator?      Balance Overall balance assessment: History of Falls, Needs assistance Sitting-balance support: Feet supported Sitting balance-Leahy Scale: Fair     Standing balance support: Bilateral upper extremity supported Standing balance-Leahy Scale: Poor                             ADL either performed or assessed with clinical judgement   ADL Overall ADL's : Needs assistance/impaired Eating/Feeding:  (poor po intake; discussed with nsg)   Grooming: Set up;Sitting   Upper Body Bathing: Minimal assistance;Sitting   Lower Body Bathing: Moderate assistance;Sit to/from stand   Upper Body Dressing : Minimal assistance;Sitting   Lower Body Dressing: Moderate assistance;Sit to/from stand   Toilet Transfer: Ambulation;Moderate assistance   Toileting-  Clothing Manipulation and Hygiene: Moderate assistance       Functional mobility during ADLs: Rolling walker (2 wheels);Cueing for safety;Moderate assistance General ADL Comments: assist to physically power up     Vision  Will further assess       Perception         Praxis         Pertinent Vitals/Pain Pain Assessment Pain Assessment: Faces Faces Pain  Scale: Hurts a little bit Pain Location: back Pain Descriptors / Indicators: Discomfort, Grimacing Pain Intervention(s): Limited activity within patient's tolerance     Extremity/Trunk Assessment Upper Extremity Assessment Upper Extremity Assessment: Generalized weakness   Lower Extremity Assessment Lower Extremity Assessment: Defer to PT evaluation   Cervical / Trunk Assessment Cervical / Trunk Assessment: Kyphotic   Communication Communication Communication: Impaired Factors Affecting Communication: Hearing impaired   Cognition Arousal: Alert Behavior During Therapy: Anxious Cognition: History of cognitive impairments, No family/caregiver present to determine baseline, Cognition impaired   Orientation impairments: Time, Situation (March) Awareness: Intellectual awareness impaired, Online awareness impaired Memory impairment (select all impairments): Short-term memory, Working civil service fast streamer, Conservation officer, historic buildings Attention impairment (select first level of impairment): Selective attention Executive functioning impairment (select all impairments): Initiation, Reasoning, Problem solving                   Following commands: Impaired Following commands impaired: Only follows one step commands consistently     Cueing  General Comments   Cueing Techniques: Verbal cues;Visual cues      Exercises     Shoulder Instructions      Home Living Family/patient expects to be discharged to:: Assisted living                             Home Equipment: Shower seat;Rollator (4 wheels)   Additional Comments: multiple falls in ILF      Prior Functioning/Environment Prior Level of Function : Independent/Modified Independent;Patient poor historian/Family not available;History of Falls (last six months)             Mobility Comments: required asssistance for ambulation on 11/30 in PT ADLs Comments: unsure; pt states she walks with a RW - unsure if RW or  rollator; staes she does her own bathing/dressing and that staff assist her with medicaiton managemetn; staes she walks to the dining room for meals    OT Problem List: Decreased strength;Decreased range of motion;Decreased activity tolerance;Impaired balance (sitting and/or standing);Decreased cognition;Decreased safety awareness;Decreased knowledge of use of DME or AE;Pain   OT Treatment/Interventions: Self-care/ADL training;Therapeutic exercise;DME and/or AE instruction;Therapeutic activities;Cognitive remediation/compensation;Patient/family education;Balance training      OT Goals(Current goals can be found in the care plan section)   Acute Rehab OT Goals Patient Stated Goal: go home OT Goal Formulation: Patient unable to participate in goal setting Time For Goal Achievement: 05/20/24 Potential to Achieve Goals: Fair   OT Frequency:  Min 2X/week    Co-evaluation              AM-PAC OT 6 Clicks Daily Activity     Outcome Measure Help from another person eating meals?: A Little Help from another person taking care of personal grooming?: A Little Help from another person toileting, which includes using toliet, bedpan, or urinal?: A Lot Help from another person bathing (including washing, rinsing, drying)?: A Lot Help from another person to put on and taking off regular upper body clothing?: A Little Help from another person to put on  and taking off regular lower body clothing?: A Lot 6 Click Score: 15   End of Session Equipment Utilized During Treatment: Gait belt;Rolling walker (2 wheels) Nurse Communication: Mobility status  Activity Tolerance: Patient tolerated treatment well Patient left: in chair;with call bell/phone within reach;with chair alarm set  OT Visit Diagnosis: Unsteadiness on feet (R26.81);Other abnormalities of gait and mobility (R26.89);Muscle weakness (generalized) (M62.81);History of falling (Z91.81);Other symptoms and signs involving cognitive  function;Pain Pain - part of body:  (back)                Time: 8462-8440 OT Time Calculation (min): 22 min Charges:  OT General Charges $OT Visit: 1 Visit OT Evaluation $OT Eval Low Complexity: 1 Low  Skylah Delauter, OT/L   Acute OT Clinical Specialist Acute Rehabilitation Services Pager [phone] Office [phone]   [facility] 05/06/2024, 4:25 PM

## 2024-05-06 NOTE — Hospital Course (Addendum)
 Patient with PMH of HLD, chronic A-fib, GERD, neuropathy, dementia, not on any anticoagulation. Present to the hospital with complaints of severe back pain. Reported she had chronic back pain but can ambulate with assistance. Patient reported to have a fall 1 week before admission.  Since then pain has intensified and patient is unable to ambulate.  Assessment and Plan: Acute on chronic back pain. Reportedly has chronic back pain and presented with acute worsening of this. CT lumbar spine shows no evidence of spinal fracture. There is significant foraminal stenosis at L5 and S1. At the time of my evaluation patient does not have any point tenderness on her spine. Actually reports that she does not have any significant pain as well and her pain is well-controlled. PT OT consulted.  Will monitor recommendation. Continue current pain regimen which includes scheduled Tylenol , lidocaine  patch, her chronic use of Neurontin  and diclofenac  gel as needed.  Fall at facility. Reportedly had a fall 1 week ago. At the time of my evaluation no focal deficit. Monitor.  Chronic neuropathy. Continue gabapentin .  HLD. Continue statin.  Dementia without behavioral issues. While the patient is oriented to time and place as well as self she is unable to tell me why she is in the hospital.  Unsure about baseline.  Lab work yesterday did not show any acute abnormality.  UA was unremarkable.  Again no focal deficit on my exam.. On Seroquel  at home which I will discontinue given her hypotension.  Monitor.  Accelerated hypertension. Now with hypotension Blood pressure initially was significantly elevated. Suspect this is related to uncontrolled pain. Patient does not take any medication at her baseline. Due to severely high blood pressure patient was started on Norvasc  although developed hypotension overnight after receiving Seroquel . Currently Seroquel  is discontinued.  Patient received IV fluid.  IV  fluid is also discouraged.  Norvasc  is discontinued.

## 2024-05-06 NOTE — Progress Notes (Addendum)
 [facility] Progress Note Patient: Susan Ford FMW:[id number] DOB: 1929-10-17  DOA: 05/05/2024 DOS: the patient was seen and examined on 05/06/2024  Brief Hospital Course: Patient with PMH of HLD, chronic A-fib, GERD, neuropathy, dementia, not on any anticoagulation. Present to the hospital with complaints of severe back pain. Reported she had chronic back pain but can ambulate with assistance. Patient reported to have a fall 1 week before admission.  Since then pain has intensified and patient is unable to ambulate.  Assessment and Plan: Acute on chronic back pain. Reportedly has chronic back pain and presented with acute worsening of this. CT lumbar spine shows no evidence of spinal fracture. There is significant foraminal stenosis at L5 and S1. At the time of my evaluation patient does not have any point tenderness on her spine. Actually reports that she does not have any significant pain as well and her pain is well-controlled. PT OT consulted.  Will monitor recommendation. Continue current pain regimen which includes scheduled Tylenol , lidocaine  patch, her chronic use of Neurontin  and diclofenac  gel as needed.  Fall at facility. Reportedly had a fall 1 week ago. At the time of my evaluation no focal deficit. Monitor.  Chronic neuropathy. Continue gabapentin .  HLD. Continue statin.  Dementia without behavioral issues. While the patient is oriented to time and place as well as self she is unable to tell me why she is in the hospital.  Unsure about baseline.  Lab work yesterday did not show any acute abnormality.  UA was unremarkable.  Again no focal deficit on my exam.  Monitor.  Accelerated hypertension. Blood pressure significantly elevated. Suspect this is related to uncontrolled pain. Patient does not take any medication at her baseline. Given her high blood pressure will initiate Norvasc  and monitor.   Subjective: Minimal oral intake.  Pain resolved for now.   No nausea or vomiting.  No BM so far.  Physical Exam: Clear to auscultation. S1-S2 present. Bowel sound present.  Nontender. No edema. No focal deficit. Alert awake and oriented x 3.  No asterixis.  Data Reviewed: I have Reviewed nursing notes, Vitals, and Lab results. Since last encounter, pertinent lab results CBC and BMP   . I have ordered test including CBC and BMP  .   Disposition: Status is: Observation  enoxaparin  (LOVENOX ) injection 40 mg Start: 05/05/24 2200   Family Communication: No one at bedside. Level of care: Med-Surg   Vitals:   05/06/24 1000 05/06/24 1440 05/06/24 1509 05/06/24 1602  BP: (!) 160/88 119/74  (!) 145/89  Pulse: 75 (!) 104 88 96  Resp: 18 20  18   Temp: 98 F (36.7 C) 97.9 F (36.6 C)  98.3 F (36.8 C)  TempSrc: Oral Oral  Oral  SpO2: 98% 97% 95% 95%     Author: Yetta Blanch, MD 05/06/2024 4:51 PM  Please look on [email or web address] to find out who is on call.

## 2024-05-07 DIAGNOSIS — I10 Essential (primary) hypertension: Secondary | ICD-10-CM | POA: Diagnosis present

## 2024-05-07 DIAGNOSIS — Z87891 Personal history of nicotine dependence: Secondary | ICD-10-CM | POA: Diagnosis not present

## 2024-05-07 DIAGNOSIS — Z85828 Personal history of other malignant neoplasm of skin: Secondary | ICD-10-CM | POA: Diagnosis not present

## 2024-05-07 DIAGNOSIS — M5442 Lumbago with sciatica, left side: Secondary | ICD-10-CM | POA: Diagnosis present

## 2024-05-07 DIAGNOSIS — F0390 Unspecified dementia without behavioral disturbance: Secondary | ICD-10-CM | POA: Diagnosis present

## 2024-05-07 DIAGNOSIS — Z888 Allergy status to other drugs, medicaments and biological substances status: Secondary | ICD-10-CM | POA: Diagnosis not present

## 2024-05-07 DIAGNOSIS — E785 Hyperlipidemia, unspecified: Secondary | ICD-10-CM | POA: Diagnosis present

## 2024-05-07 DIAGNOSIS — Z86711 Personal history of pulmonary embolism: Secondary | ICD-10-CM | POA: Diagnosis not present

## 2024-05-07 DIAGNOSIS — I959 Hypotension, unspecified: Secondary | ICD-10-CM | POA: Diagnosis not present

## 2024-05-07 DIAGNOSIS — W19XXXA Unspecified fall, initial encounter: Secondary | ICD-10-CM | POA: Diagnosis present

## 2024-05-07 DIAGNOSIS — Z881 Allergy status to other antibiotic agents status: Secondary | ICD-10-CM | POA: Diagnosis not present

## 2024-05-07 DIAGNOSIS — M5459 Other low back pain: Secondary | ICD-10-CM | POA: Diagnosis present

## 2024-05-07 DIAGNOSIS — Z90710 Acquired absence of both cervix and uterus: Secondary | ICD-10-CM | POA: Diagnosis not present

## 2024-05-07 DIAGNOSIS — G8929 Other chronic pain: Secondary | ICD-10-CM | POA: Diagnosis present

## 2024-05-07 DIAGNOSIS — Z66 Do not resuscitate: Secondary | ICD-10-CM | POA: Diagnosis present

## 2024-05-07 DIAGNOSIS — G609 Hereditary and idiopathic neuropathy, unspecified: Secondary | ICD-10-CM | POA: Diagnosis present

## 2024-05-07 DIAGNOSIS — M48061 Spinal stenosis, lumbar region without neurogenic claudication: Secondary | ICD-10-CM | POA: Diagnosis present

## 2024-05-07 DIAGNOSIS — Z79899 Other long term (current) drug therapy: Secondary | ICD-10-CM | POA: Diagnosis not present

## 2024-05-07 DIAGNOSIS — Z9049 Acquired absence of other specified parts of digestive tract: Secondary | ICD-10-CM | POA: Diagnosis not present

## 2024-05-07 LAB — CBC WITH DIFFERENTIAL/PLATELET
Abs Immature Granulocytes: 0.01 K/uL (ref 0.00–0.07)
Basophils Absolute: 0 K/uL (ref 0.0–0.1)
Basophils Relative: 1 %
Eosinophils Absolute: 0.2 K/uL (ref 0.0–0.5)
Eosinophils Relative: 3 %
HCT: 36.3 % (ref 36.0–46.0)
Hemoglobin: 11.9 g/dL — ABNORMAL LOW (ref 12.0–15.0)
Immature Granulocytes: 0 %
Lymphocytes Relative: 22 %
Lymphs Abs: 1.4 K/uL (ref 0.7–4.0)
MCH: 29.8 pg (ref 26.0–34.0)
MCHC: 32.8 g/dL (ref 30.0–36.0)
MCV: 91 fL (ref 80.0–100.0)
Monocytes Absolute: 0.3 K/uL (ref 0.1–1.0)
Monocytes Relative: 6 %
Neutro Abs: 4.3 K/uL (ref 1.7–7.7)
Neutrophils Relative %: 68 %
Platelets: 142 K/uL — ABNORMAL LOW (ref 150–400)
RBC: 3.99 MIL/uL (ref 3.87–5.11)
RDW: 12.6 % (ref 11.5–15.5)
Smear Review: NORMAL
WBC: 6.2 K/uL (ref 4.0–10.5)
nRBC: 0 % (ref 0.0–0.2)

## 2024-05-07 LAB — COMPREHENSIVE METABOLIC PANEL WITH GFR
ALT: 7 U/L (ref 0–44)
AST: 21 U/L (ref 15–41)
Albumin: 2.9 g/dL — ABNORMAL LOW (ref 3.5–5.0)
Alkaline Phosphatase: 72 U/L (ref 38–126)
Anion gap: 10 (ref 5–15)
BUN: 15 mg/dL (ref 8–23)
CO2: 19 mmol/L — ABNORMAL LOW (ref 22–32)
Calcium: 8.4 mg/dL — ABNORMAL LOW (ref 8.9–10.3)
Chloride: 103 mmol/L (ref 98–111)
Creatinine, Ser: 0.78 mg/dL (ref 0.44–1.00)
GFR, Estimated: >60 mL/min
Glucose, Bld: 157 mg/dL — ABNORMAL HIGH (ref 70–99)
Potassium: 3.8 mmol/L (ref 3.5–5.1)
Sodium: 132 mmol/L — ABNORMAL LOW (ref 135–145)
Total Bilirubin: 0.5 mg/dL (ref 0.0–1.2)
Total Protein: 5.7 g/dL — ABNORMAL LOW (ref 6.5–8.1)

## 2024-05-07 LAB — MAGNESIUM: Magnesium: 1.9 mg/dL (ref 1.7–2.4)

## 2024-05-07 NOTE — TOC Initial Note (Signed)
 Transition of Care [facility]) - Initial/Assessment Note   Patient Details  Name: Susan Ford MRN: [id number] Date of Birth: 1930/04/13  Transition of [facility]) CM/SW Contact:    Susan Ford [name], LCSW Phone Number: 05/07/2024, 2:40 PM  Clinical Narrative: PT evaluation recommended HH. CSW followed up with patient's son, Susan Ford, to complete assessment. Son confirmed patient resides in independent living at [facility] and she has 24/7 caregivers. Son would like patient to receive HHPT/OT through [facility], which is provided in-house at [employer]. CSW notified Susan Ford with [facility] and faxed HH orders [phone]). Care management to follow.  Expected Discharge Plan: Home/Self Care ([facility]) Barriers to Discharge: Continued Medical Work up  Patient Goals and CMS Choice Patient states their goals for this hospitalization and ongoing recovery are:: Get PT/OT at [facility].gov Compare Post Acute Care list provided to:: Patient Represenative (must comment) Choice offered to / list presented to : Adult Children  Expected Discharge Plan and Services In-house Referral: Clinical Social Work Post Acute Care Choice: Home Health Living arrangements for the past 2 months: Independent Living Facility           DME Arranged: N/A DME Agency: NA HH Arranged: PT, OT HH Agency: Other - See comment [employer]/development Worker) Date HH Agency Contacted: 05/07/24 Time HH Agency Contacted: 1439 Representative spoke with at [facility] Agency: Susan Ford  Prior Living Arrangements/Services Living arrangements for the past 2 months: Independent Living Facility Lives with:: Facility Resident Patient language and need for interpreter reviewed:: Yes Do you feel safe going back to the place where you live?: Yes      Need for Family Participation in Patient Care: Yes (Comment) (Patient oriented x2.) Care giver support system in place?: Yes (comment) Current home services: Other (comment) (24/7 caregivers) Criminal  Activity/Legal Involvement Pertinent to Current Situation/Hospitalization: No - Comment as needed  Activities of Daily Living ADL Screening (condition at time of admission) Independently performs ADLs?: No Does the patient have a NEW difficulty with bathing/dressing/toileting/self-feeding that is expected to last >3 days?: Yes (Initiates electronic notice to provider for possible OT consult) Does the patient have a NEW difficulty with getting in/out of bed, walking, or climbing stairs that is expected to last >3 days?: Yes (Initiates electronic notice to provider for possible PT consult) Does the patient have a NEW difficulty with communication that is expected to last >3 days?: No Is the patient deaf or have difficulty hearing?: Yes Does the patient have difficulty seeing, even when wearing glasses/contacts?: No Does the patient have difficulty concentrating, remembering, or making decisions?: Yes  Permission Sought/Granted Permission sought to share information with : Other (comment) Permission granted to share information with : Yes, Verbal Permission Granted Permission granted to share info w AGENCY: Chief Technology Officer Susan Ford)  Emotional Assessment Orientation: : Oriented to Self, Oriented to Place Alcohol  / Substance Use: Not Applicable Psych Involvement: No (comment)  Admission diagnosis:  Intractable low back pain [M54.59] Chronic low back pain with left-sided sciatica, unspecified back pain laterality [M54.42, G89.29] Patient Active Problem List   Diagnosis Date Noted   Intractable low back pain 05/05/2024   Generalized weakness 03/29/2024   Acute UTI (urinary tract infection) 03/26/2024   Mild protein malnutrition 03/26/2024   Common bile duct stone 03/22/2024   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   Left lower quadrant abdominal pain 03/22/2024   Influenza A 07/08/2023   Physical deconditioning 07/06/2023   ABLA (acute blood loss anemia) 04/12/2023   History of bleeding peptic ulcer  04/12/2023   History of  pulmonary embolism, 12/28/22 05-12-2023   Chronic anticoagulation 05-12-23   Acute GI bleeding 05/12/23   Acute UTI 05-12-23   Pulmonary embolism (HCC) 12/28/2022   Diarrhea 12/28/2022   Melena, with acute blood loss anemia 07/08/2022   Dementia without behavioral disturbance (HCC) 10/03/2017   Gait abnormality 06/29/2016   Chronic bilateral low back pain without sciatica 06/29/2016   Memory loss 11/14/2015   Death of husband 2014/05/11   Bereavement due to life event 2014-05-11   Absolute anemia 04/08/2014   Atrial flutter (HCC) 02/23/2014   Acute calculous cholecystitis 02/21/2014   Spinal stenosis of lumbar region 01/04/2014   Recent bereavement 10/25/2013   Arthritis 10/25/2013   Hyperlipidemia 10/23/2013   Hereditary and idiopathic peripheral neuropathy    Nausea alone 07/14/2012   Neuropathy, peripheral 05/05/2011   High risk medication use 05/05/2011   Subcutaneous emphysema 05/05/2011   Cough 04/03/2011   Neuropathy 04/03/2011   Osteoarthritis 05/15/2010   STIFFNESS OF JOINT NEC ANKLE AND FOOT 05/15/2010   GANGLION CYST, WRIST, RIGHT 05/15/2010   VERTIGO, BENIGN PAROXYSMAL POSITION 01/31/2007   PCP:  Charlett [name], MD Pharmacy:   [facility] [phone] - [place],  - [address] AT [facility] [address] [place] [address] Phone: [phone] Fax: [phone]  Social Drivers of Health (SDOH) Social History: SDOH Screenings   Food Insecurity: No Food Insecurity (05/05/2024)  Housing: Low Risk (05/05/2024)  Transportation Needs: Patient Declined (05/05/2024)  Recent Concern: Transportation Needs - Unmet Transportation Needs (03/26/2024)  Utilities: Patient Declined (05/05/2024)  Alcohol  Screen: Low Risk (06/15/2023)  Depression (PHQ2-9): Low Risk (06/15/2023)  Financial Resource Strain: Low Risk (06/15/2023)  Physical Activity: Inactive (06/15/2023)  Social Connections: Moderately Integrated  (05/05/2024)  Stress: No Stress Concern Present (06/15/2023)  Tobacco Use: Medium Risk (05/06/2024)  Health Literacy: Adequate Health Literacy (06/15/2023)   SDOH Interventions:    Readmission Risk Interventions    05/07/2024    2:30 PM 03/27/2024   12:46 PM  Readmission Risk Prevention Plan  Transportation Screening Complete Complete  Medication Review (RN Care Manager) Complete Complete  PCP or Specialist appointment within 3-5 days of discharge  Complete  HRI or Home Care Consult Complete Complete  SW Recovery Care/Counseling Consult Complete Complete  Palliative Care Screening Not Applicable Not Applicable  Skilled Nursing Facility Not Applicable Complete

## 2024-05-07 NOTE — Evaluation (Signed)
 Physical Therapy Evaluation Patient Details Name: Susan Ford MRN: [id number] DOB: [birth year] Today's Date: 05/07/2024  History of Present Illness  89 yo brought from her facility ([facility]) via EMS due to exacerbation of her back pain. PMH:  osteoarthritis, hyperlipidemia, bilateral cataracts, choledocholithiasis, cholecystitis, history of ERCP, cholecystectomy, single episode transient postcholecystectomy A-fib, history of a flutter, history of anticoagulation, history of GI bleed, GERD, hereditary and idiopathic peripheral neuropathy, history of pulmonary embolism.  Clinical Impression  Pt admitted with above diagnosis.  Pt reluctantly agreeable to PT/OOB. Pt overall min assist this session, amb distance ltd d/t pt incontinent of urine in standing. Assisted with LB bathing once pt seated in chair. Pt can likely return to [facility]  with HHPT, pt has caregivers per RN's conversation with pt son. Will follow in acute setting  Pt currently with functional limitations due to the deficits listed below (see PT Problem List). Pt will benefit from acute skilled PT to increase their independence and safety with mobility to allow discharge.           If plan is discharge home, recommend the following: A little help with walking and/or transfers;A little help with bathing/dressing/bathroom;Help with stairs or ramp for entrance;Assist for transportation;Supervision due to cognitive status;Direct supervision/assist for medications management   Can travel by private vehicle        Equipment Recommendations None recommended by PT  Recommendations for Other Services       Functional Status Assessment Patient has had a recent decline in their functional status and demonstrates the ability to make significant improvements in function in a reasonable and predictable amount of time.     Precautions / Restrictions Precautions Precautions: Fall Restrictions Weight Bearing Restrictions Per  Provider Order: No      Mobility  Bed Mobility Overal bed mobility: Needs Assistance Bed Mobility: Supine to Sit     Supine to sit: Min assist     General bed mobility comments: light assist to elevate trunk, incr time needed    Transfers Overall transfer level: Needs assistance Equipment used: Rolling walker (2 wheels) Transfers: Sit to/from Stand Sit to Stand: Min assist           General transfer comment: pt pulls of RW to stand, steadying assist given. (possibly uses rollator-? at baseline)    Ambulation/Gait Ambulation/Gait assistance: Min assist Gait Distance (Feet): 5 Feet Assistive device: Rolling walker (2 wheels) Gait Pattern/deviations: Decreased step length - left, Decreased step length - right       General Gait Details: steadying assist, distance limited by urinary incontinence.  Stairs            Wheelchair Mobility     Tilt Bed    Modified Rankin (Stroke Patients Only)       Balance Overall balance assessment: Needs assistance, History of Falls Sitting-balance support: Feet supported, No upper extremity supported Sitting balance-Leahy Scale: Fair     Standing balance support: Bilateral upper extremity supported, Reliant on assistive device for balance Standing balance-Leahy Scale: Poor                               Pertinent Vitals/Pain Pain Assessment Pain Assessment: Faces Faces Pain Scale: No hurt Pain Location: pt says no when asked about pain    Home Living Family/patient expects to be discharged to:: Assisted living                 Home  Equipment: Educational Psychologist (4 wheels) Additional Comments: multiple falls in ILF    Prior Function Prior Level of Function : Independent/Modified Independent;Patient poor historian/Family not available;History of Falls (last six months)             Mobility Comments: required asssistance for ambulation on 11/30 in PT ADLs Comments: unsure; pt states  she walks with a RW - unsure if RW or rollator; staes she does her own bathing/dressing and that staff assist her with medicaiton management; staes she walks to the dining room for meals     Extremity/Trunk Assessment   Upper Extremity Assessment Upper Extremity Assessment: Defer to OT evaluation;Generalized weakness    Lower Extremity Assessment Lower Extremity Assessment: Generalized weakness    Cervical / Trunk Assessment Cervical / Trunk Assessment: Kyphotic  Communication   Communication Communication: Impaired Factors Affecting Communication: Hearing impaired    Cognition Arousal: Alert Behavior During Therapy: WFL for tasks assessed/performed   PT - Cognitive impairments: History of cognitive impairments                         Following commands: Impaired Following commands impaired: Only follows one step commands consistently     Cueing Cueing Techniques: Verbal cues, Visual cues     General Comments General comments (skin integrity, edema, etc.): pt scratched her back once in sitting, area bleeding; RN placed foam pad over area    Exercises     Assessment/Plan    PT Assessment Patient needs continued PT services  PT Problem List Decreased strength;Decreased activity tolerance;Decreased balance;Decreased mobility;Decreased cognition       PT Treatment Interventions DME instruction;Therapeutic exercise;Gait training;Functional mobility training;Therapeutic activities;Patient/family education    PT Goals (Current goals can be found in the Care Plan section)  Acute Rehab PT Goals PT Goal Formulation: Patient unable to participate in goal setting Time For Goal Achievement: 05/21/24 Potential to Achieve Goals: Good    Frequency Min 3X/week     Co-evaluation               AM-PAC PT 6 Clicks Mobility  Outcome Measure Help needed turning from your back to your side while in a flat bed without using bedrails?: A Little Help needed  moving from lying on your back to sitting on the side of a flat bed without using bedrails?: A Little Help needed moving to and from a bed to a chair (including a wheelchair)?: A Little Help needed standing up from a chair using your arms (e.g., wheelchair or bedside chair)?: A Little Help needed to walk in hospital room?: A Little Help needed climbing 3-5 steps with a railing? : A Lot 6 Click Score: 17    End of Session Equipment Utilized During Treatment: Gait belt Activity Tolerance: Patient tolerated treatment well;Other (comment) (session limited d/t urinary  incontinence in standing) Patient left: in chair;with call bell/phone within reach;with chair alarm set Nurse Communication: Mobility status PT Visit Diagnosis: Other abnormalities of gait and mobility (R26.89)    Time: 8885-8867 PT Time Calculation (min) (ACUTE ONLY): 18 min   Charges:   PT Evaluation $PT Eval Low Complexity: 1 Low   PT General Charges $$ ACUTE PT VISIT: 1 Visit         Kirat Mezquita, PT  Acute Rehab Dept ([facility]) [phone]  05/07/2024   [facility] 05/07/2024, 1:20 PM

## 2024-05-07 NOTE — Progress Notes (Signed)
 [facility] Progress Note Patient: Susan Ford FMW:[id number] DOB: 04-Jan-1930  DOA: 05/05/2024 DOS: the patient was seen and examined on 05/07/2024  Brief Hospital Course: Patient with PMH of HLD, chronic A-fib, GERD, neuropathy, dementia, not on any anticoagulation. Present to the hospital with complaints of severe back pain. Reported she had chronic back pain but can ambulate with assistance. Patient reported to have a fall 1 week before admission.  Since then pain has intensified and patient is unable to ambulate.  Assessment and Plan: Acute on chronic back pain. Reportedly has chronic back pain and presented with acute worsening of this. CT lumbar spine shows no evidence of spinal fracture. There is significant foraminal stenosis at L5 and S1. At the time of my evaluation patient does not have any point tenderness on her spine. Actually reports that she does not have any significant pain as well and her pain is well-controlled. PT OT consulted.  Will monitor recommendation. Continue current pain regimen which includes scheduled Tylenol , lidocaine  patch, her chronic use of Neurontin  and diclofenac  gel as needed.  Fall at facility. Reportedly had a fall 1 week ago. At the time of my evaluation no focal deficit. Monitor.  Chronic neuropathy. Continue gabapentin .  HLD. Continue statin.  Dementia without behavioral issues. While the patient is oriented to time and place as well as self she is unable to tell me why she is in the hospital.  Unsure about baseline.  Lab work yesterday did not show any acute abnormality.  UA was unremarkable.  Again no focal deficit on my exam.. On Seroquel  at home which I will discontinue given her hypotension.  Monitor.  Accelerated hypertension. Now with hypotension Blood pressure initially was significantly elevated. Suspect this is related to uncontrolled pain. Patient does not take any medication at her baseline. Due to severely high  blood pressure patient was started on Norvasc  although developed hypotension overnight after receiving Seroquel . Currently Seroquel  is discontinued.  Patient received IV fluid.  IV fluid is also discouraged.  Norvasc  is discontinued.   Subjective: Denies any acute complaint.  No nausea no vomiting.  Oral intake is gradually improving.  Physical Exam: Clear to auscultation. S1-S2 present Bowel sound present.  Oriented to self.  Data Reviewed: I have Reviewed nursing notes, Vitals, and Lab results. Since last encounter, pertinent lab results CBC and BMP   .   Disposition: Status is: Inpatient Remains inpatient appropriate because: Monitor for placement.  Place and maintain sequential compression device Start: 05/06/24 2126 enoxaparin  (LOVENOX ) injection 40 mg Start: 05/05/24 2200   Family Communication: Discussed with son on the phone. Level of care: Med-Surg   Vitals:   05/07/24 0525 05/07/24 0949 05/07/24 1439 05/07/24 1758  BP: (!) 113/59 (!) 108/53 (!) 120/57 138/71  Pulse: (!) 57 84 74 72  Resp: 20 16 16 16   Temp: (!) 97.2 F (36.2 C) 98.2 F (36.8 C) 98.1 F (36.7 C) 98.1 F (36.7 C)  TempSrc: Oral Oral Oral Oral  SpO2: 100% 96% 97% 100%  Weight: 52 kg     Height: 5' 4 (1.626 m)        Author: Yetta Blanch, MD 05/07/2024 6:07 PM  Please look on [email or web address] to find out who is on call.

## 2024-05-07 NOTE — Progress Notes (Signed)
 Patient noted with AMS & hypotension status post administration of home meds: Seroquel  and Gabapentin . On-Call NP Kristian) made aware. Rapid Nurse Devonne) called to floor for assessment/evaluation. Will continue to monitor the patient.

## 2024-05-08 ENCOUNTER — Ambulatory Visit: Admitting: Internal Medicine

## 2024-05-08 ENCOUNTER — Other Ambulatory Visit (HOSPITAL_COMMUNITY): Payer: Self-pay

## 2024-05-08 MED ORDER — LIDOCAINE 5 % EX PTCH
1.0000 | MEDICATED_PATCH | CUTANEOUS | 0 refills | Status: AC
  Filled 2024-05-08: qty 30, 30d supply, fill #0

## 2024-05-08 MED ORDER — DICLOFENAC SODIUM 1 % EX GEL
4.0000 g | Freq: Four times a day (QID) | CUTANEOUS | 0 refills | Status: AC | PRN
  Filled 2024-05-08: qty 100, 7d supply, fill #0

## 2024-05-08 MED ORDER — ENSURE PLUS HIGH PROTEIN PO LIQD: 237.0000 mL | Freq: Three times a day (TID) | ORAL | Status: AC

## 2024-05-08 MED ORDER — POLYETHYLENE GLYCOL 3350 17 GM/SCOOP PO POWD
17.0000 g | Freq: Every day | ORAL | 0 refills | Status: AC | PRN
  Filled 2024-05-08: qty 238, 14d supply, fill #0

## 2024-05-08 NOTE — Plan of Care (Signed)

## 2024-05-08 NOTE — Progress Notes (Signed)
 Pt is alert and oriented to self and place, pleasant and cooperative. Pt is on room air, not in any distress, denies any pain at this time. Pt discharged with all of her belongings via PTAR. [name]-Robert updated over the phone.

## 2024-05-08 NOTE — Discharge Summary (Signed)
 " Physician Discharge Summary   Patient: Susan Ford MRN: [id number] DOB: 12/22/1929  Admit date:     05/05/2024  Discharge date: 05/08/2024  Discharge Physician: Yetta Blanch  PCP: Charlett [name], MD  Disposition: [facility] Recommendations at discharge:  Follow up with PCP in 1 week   Follow-up Information     Panosh, Wanda K, MD. Schedule an appointment as soon as possible for a visit in 1 week(s).   Specialties: Internal Medicine, Pediatrics Contact information: [address] [place] [address] [phone]                Hospital Course: Patient with PMH of HLD, chronic A-fib, GERD, neuropathy, dementia, not on any anticoagulation. Present to the hospital with complaints of severe back pain. Reported she had chronic back pain but can ambulate with assistance. Patient reported to have a fall 1 week before admission.  Since then pain has intensified and patient is unable to ambulate.  Assessment and Plan: Acute on chronic back pain. Reportedly has chronic back pain and presented with acute worsening of this. CT lumbar spine shows no evidence of spinal fracture. There is significant foraminal stenosis at L5 and S1. At the time of my evaluation patient does not have any point tenderness on her spine. Actually reports that she does not have any significant pain as well and her pain is well-controlled. PT OT consulted.  Will monitor recommendation. Continue current pain regimen which includes scheduled Tylenol , lidocaine  patch, her chronic use of Neurontin  and diclofenac  gel as needed.  Fall at facility. Reportedly had a fall 1 week ago. At the time of my evaluation no focal deficit. Monitor.  Chronic neuropathy. Continue gabapentin .  HLD. Continue statin.  Dementia without behavioral issues. While the patient is oriented to time and place as well as self she is unable to tell me why she is in the hospital.  Unsure about baseline.  Lab work  yesterday did not show any acute abnormality.  UA was unremarkable.  Again no focal deficit on my exam.. On Seroquel  at home which I will discontinue given her hypotension.  Monitor.  Accelerated hypertension. Now with hypotension Blood pressure initially was significantly elevated. Suspect this is related to uncontrolled pain. Patient does not take any medication at her baseline. Due to severely high blood pressure patient was started on Norvasc  although developed hypotension overnight after receiving Seroquel . Currently Seroquel  is discontinued.  Patient received IV fluid.  IV fluid is also discouraged.  Norvasc  is discontinued.   Family Communication: Plan was discussed with Family earlier. Opportunity was given to ask question and all questions were answered satisfactorily.  Consultants:  none  Procedures performed:  none  DISCHARGE MEDICATION: Allergies as of 05/08/2024       Reactions   Lyrica [pregabalin] Other (See Comments)   High Fever   Oxytetracycline Other (See Comments)   High Fever caused by terramycin   Memantine  Rash        Medication List     STOP taking these medications    QUEtiapine  25 MG tablet Commonly known as: SEROQUEL        TAKE these medications    acetaminophen  325 MG tablet Commonly known as: TYLENOL  Take 650 mg by mouth every 6 (six) hours as needed for moderate pain (pain score 4-6) or mild pain (pain score 1-3).   diclofenac  Sodium 1 % Gel Commonly known as: VOLTAREN  Apply 4 g topically 4 (four) times daily as needed (back pain).  feeding supplement Liqd Take 237 mLs by mouth 3 (three) times daily between meals.   gabapentin  300 MG capsule Commonly known as: NEURONTIN  TAKE 1 CAPSULE BY MOUTH EVERYDAY AT BEDTIME What changed: See the new instructions.   lidocaine  5 % Commonly known as: LIDODERM  Place 1 patch onto the skin daily. Remove & Discard patch within 12 hours or as directed by MD Start taking on: May 09, 2024   multivitamin tablet Take 1 tablet by mouth daily after lunch.   polyethylene glycol 17 g packet Commonly known as: MIRALAX  / GLYCOLAX  Take 17 g by mouth daily as needed for mild constipation.   Prevagen Extra Strength 20 MG Caps Generic drug: Apoaequorin Take 20 mg by mouth daily.   senna 8.6 MG Tabs tablet Commonly known as: SENOKOT Take 2 tablets (17.2 mg total) by mouth at bedtime.   simvastatin  10 MG tablet Commonly known as: ZOCOR  TAKE 1 TABLET BY MOUTH EVERY DAY   vitamin C  1000 MG tablet Take 1,000 mg by mouth daily.        Diet recommendation: Regular diet  Discharge Exam: Vitals:   05/07/24 1439 05/07/24 1758 05/07/24 2259 05/08/24 0624  BP: (!) 120/57 138/71 (!) 143/80 (!) 159/72  Pulse: 74 72 66 60  Resp: 16 16 18    Temp: 98.1 F (36.7 C) 98.1 F (36.7 C) 98.7 F (37.1 C) 98.6 F (37 C)  TempSrc: Oral Oral Oral Oral  SpO2: 97% 100% 97% 98%  Weight:      Height:        Filed Weights   05/07/24 0525  Weight: 52 kg   General: in Mild distress, No Rash Cardiovascular: S1 and S2 Present, No Murmur Respiratory: Good respiratory effort, Bilateral Air entry present. No Crackles, No wheezes Abdomen: Bowel Sound present, No tenderness Extremities: No edema Neuro: Alert and oriented x self, no new focal deficit  Condition at discharge: stable  The results of significant diagnostics from this hospitalization (including imaging, microbiology, ancillary and laboratory) are listed below for reference.   Imaging Studies: CT Lumbar Spine Wo Contrast Result Date: 05/05/2024 EXAM: CT OF THE LUMBAR SPINE WITHOUT CONTRAST 05/05/2024 01:59:07 PM TECHNIQUE: CT of the lumbar spine was performed without the administration of intravenous contrast. Multiplanar reformatted images are provided for review. Automated exposure control, iterative reconstruction, and/or weight based adjustment of the mA/kV was utilized to reduce the radiation dose to as low as reasonably  achievable. COMPARISON: CT abdomen and pelvis 03/26/2024. MRI lumbar spine 07/29/2016. CLINICAL HISTORY: Back trauma, no prior imaging. Worsening back pain with inability to ambulate and decreased urine output. FINDINGS: BONES AND ALIGNMENT: 5 lumbar type vertebrae. Mild levoscoliosis near the thoracolumbar junction and mild lower thoracic dextroscoliosis. No substantial listhesis. Diffuse osteopenia. No acute lumbar spine fracture or destructive lesion. Mild chronic depression of the T12 superior endplate on the left. DEGENERATIVE CHANGES: Widespread chronic degenerative endplate changes and advanced disc space narrowing throughout the lumbar spine. Previous left laminectomy at L4-L5 and L5-S1. Mild to moderate spinal stenosis at L3-L4 due to disc bulging and posterior element hypertrophy. Moderate bilateral neural foraminal stenosis at L4-L5 and severe left greater than right neural foraminal stenosis at L5-S1, similar to the prior MRI. SOFT TISSUES: Cholecystectomy. Abdominal aortic atherosclerosis without aneurysm. Colonic diverticulosis. IMPRESSION: 1. No acute lumbar spine fracture. 2. Diffuse lumbar disc and facet degeneration with mild to moderate spinal stenosis at L3-L4. 3. Moderate neural foraminal stenosis at L4-L5 and severe foraminal stenosis at L5-S1. Electronically signed by: Dasie  Derrill MD MD 05/05/2024 02:48 PM EST RP Workstation: [id number]    Microbiology: Results for orders placed or performed during the hospital encounter of 03/26/24  Urine Culture     Status: Abnormal   Collection Time: 03/26/24 10:31 AM   Specimen: Urine, Random  Result Value Ref Range Status   Specimen Description   Final    URINE, RANDOM Performed at [facility], [address], [place], [place] [address]    Special Requests   Final    NONE Reflexed from [id number] Performed at [facility], [address], [place], [place] [address]    Culture (A)  Final    >=100,000  COLONIES/mL KLEBSIELLA AEROGENES >=100,000 COLONIES/mL ESCHERICHIA COLI    Report Status 03/29/2024 FINAL  Final   Organism ID, Bacteria KLEBSIELLA AEROGENES (A)  Final   Organism ID, Bacteria ESCHERICHIA COLI (A)  Final      Susceptibility   Klebsiella aerogenes - MIC*    CEFEPIME <=0.12 SENSITIVE Sensitive     ERTAPENEM <=0.12 SENSITIVE Sensitive     CEFTRIAXONE  <=0.25 SENSITIVE Sensitive     CIPROFLOXACIN  <=0.06 SENSITIVE Sensitive     GENTAMICIN <=1 SENSITIVE Sensitive     NITROFURANTOIN 64 INTERMEDIATE Intermediate     TRIMETH /SULFA  <=20 SENSITIVE Sensitive     PIP/TAZO Value in next row Sensitive      <=4 SENSITIVEThis is a modified FDA-approved test that has been validated and its performance characteristics determined by the reporting laboratory.  This laboratory is certified under the Clinical Laboratory Improvement Amendments CLIA as qualified to perform high complexity clinical laboratory testing.    MEROPENEM Value in next row Sensitive      <=4 SENSITIVEThis is a modified FDA-approved test that has been validated and its performance characteristics determined by the reporting laboratory.  This laboratory is certified under the Clinical Laboratory Improvement Amendments CLIA as qualified to perform high complexity clinical laboratory testing.    * >=100,000 COLONIES/mL KLEBSIELLA AEROGENES   Escherichia coli - MIC*    AMPICILLIN Value in next row Sensitive      <=4 SENSITIVEThis is a modified FDA-approved test that has been validated and its performance characteristics determined by the reporting laboratory.  This laboratory is certified under the Clinical Laboratory Improvement Amendments CLIA as qualified to perform high complexity clinical laboratory testing.    CEFAZOLIN  (URINE) Value in next row Sensitive      2 SENSITIVEThis is a modified FDA-approved test that has been validated and its performance characteristics determined by the reporting laboratory.  This laboratory is  certified under the Clinical Laboratory Improvement Amendments CLIA as qualified to perform high complexity clinical laboratory testing.    CEFEPIME Value in next row Sensitive      2 SENSITIVEThis is a modified FDA-approved test that has been validated and its performance characteristics determined by the reporting laboratory.  This laboratory is certified under the Clinical Laboratory Improvement Amendments CLIA as qualified to perform high complexity clinical laboratory testing.    ERTAPENEM Value in next row Sensitive      2 SENSITIVEThis is a modified FDA-approved test that has been validated and its performance characteristics determined by the reporting laboratory.  This laboratory is certified under the Clinical Laboratory Improvement Amendments CLIA as qualified to perform high complexity clinical laboratory testing.    CEFTRIAXONE  Value in next row Sensitive      2 SENSITIVEThis is a modified FDA-approved test that has been validated and its performance characteristics determined by the  reporting laboratory.  This laboratory is certified under the Clinical Laboratory Improvement Amendments CLIA as qualified to perform high complexity clinical laboratory testing.    CIPROFLOXACIN  Value in next row Sensitive      2 SENSITIVEThis is a modified FDA-approved test that has been validated and its performance characteristics determined by the reporting laboratory.  This laboratory is certified under the Clinical Laboratory Improvement Amendments CLIA as qualified to perform high complexity clinical laboratory testing.    GENTAMICIN Value in next row Sensitive      2 SENSITIVEThis is a modified FDA-approved test that has been validated and its performance characteristics determined by the reporting laboratory.  This laboratory is certified under the Clinical Laboratory Improvement Amendments CLIA as qualified to perform high complexity clinical laboratory testing.    NITROFURANTOIN Value in next row  Sensitive      2 SENSITIVEThis is a modified FDA-approved test that has been validated and its performance characteristics determined by the reporting laboratory.  This laboratory is certified under the Clinical Laboratory Improvement Amendments CLIA as qualified to perform high complexity clinical laboratory testing.    TRIMETH /SULFA  Value in next row Sensitive      2 SENSITIVEThis is a modified FDA-approved test that has been validated and its performance characteristics determined by the reporting laboratory.  This laboratory is certified under the Clinical Laboratory Improvement Amendments CLIA as qualified to perform high complexity clinical laboratory testing.    AMPICILLIN/SULBACTAM Value in next row Sensitive      2 SENSITIVEThis is a modified FDA-approved test that has been validated and its performance characteristics determined by the reporting laboratory.  This laboratory is certified under the Clinical Laboratory Improvement Amendments CLIA as qualified to perform high complexity clinical laboratory testing.    PIP/TAZO Value in next row Sensitive      <=4 SENSITIVEThis is a modified FDA-approved test that has been validated and its performance characteristics determined by the reporting laboratory.  This laboratory is certified under the Clinical Laboratory Improvement Amendments CLIA as qualified to perform high complexity clinical laboratory testing.    MEROPENEM Value in next row Sensitive      <=4 SENSITIVEThis is a modified FDA-approved test that has been validated and its performance characteristics determined by the reporting laboratory.  This laboratory is certified under the Clinical Laboratory Improvement Amendments CLIA as qualified to perform high complexity clinical laboratory testing.    * >=100,000 COLONIES/mL ESCHERICHIA COLI   Labs: CBC: Recent Labs  Lab 05/05/24 1317 05/07/24 1228  WBC 7.0 6.2  NEUTROABS 4.5 4.3  HGB 12.9 11.9*  HCT 38.6 36.3  MCV 90.4 91.0  PLT  236 142*   Basic Metabolic Panel: Recent Labs  Lab 05/05/24 1317 05/07/24 1228  NA 133* 132*  K 4.2 3.8  CL 100 103  CO2 24 19*  GLUCOSE 102* 157*  BUN 10 15  CREATININE 0.62 0.78  CALCIUM 9.3 8.4*  MG  --  1.9   Liver Function Tests: Recent Labs  Lab 05/07/24 1228  AST 21  ALT 7  ALKPHOS 72  BILITOT 0.5  PROT 5.7*  ALBUMIN 2.9*   CBG: No results for input(s): GLUCAP in the last 168 hours.  Discharge time spent: 35 minutes  Author: Yetta Blanch, MD  [facility]    "

## 2024-05-08 NOTE — TOC Transition Note (Signed)
 Transition of Care [facility]) - Discharge Note   Patient Details  Name: Susan Ford MRN: [id number] Date of Birth: 10/12/1929  [facility] CM/SW Contact:  NORMAN ASPEN, LCSW Phone Number: 05/08/2024, 2:13 PM   Clinical Narrative:     Pt medically cleared for dc today returning to [place] apt at [facility].  Son confirms 24/7 caregivers are in place and requests ambulance transportation for mother back to apt.  Confirmed that [facility] therapy orders received by [facility].  PTAR called at 2:10pm.  No further IP CM needs.  Final next level of care: Other (comment) ([place] apt at [facility]) Barriers to Discharge: Barriers Resolved   Patient Goals and CMS Choice Patient states their goals for this hospitalization and ongoing recovery are:: Get PT/OT at [facility].gov Compare Post Acute Care list provided to:: Patient Represenative (must comment) Choice offered to / list presented to : Adult Children      Discharge Placement                       Discharge Plan and Services Additional resources added to the After Visit Summary for   In-house Referral: Clinical Social Work   Post Acute Care Choice: Home Health          DME Arranged: N/A DME Agency: NA       HH Arranged: PT, OT HH Agency: Other - See comment [employer]/development Worker) Date HH Agency Contacted: 05/07/24 Time HH Agency Contacted: 1439 Representative spoke with at [facility] Agency: [name]  Social Drivers of Health (SDOH) Interventions SDOH Screenings   Food Insecurity: No Food Insecurity (05/05/2024)  Housing: Low Risk (05/05/2024)  Transportation Needs: Patient Declined (05/05/2024)  Recent Concern: Transportation Needs - Unmet Transportation Needs (03/26/2024)  Utilities: Patient Declined (05/05/2024)  Alcohol  Screen: Low Risk (06/15/2023)  Depression (PHQ2-9): Low Risk (06/15/2023)  Financial Resource Strain: Low Risk (06/15/2023)  Physical Activity: Inactive (06/15/2023)  Social Connections: Moderately Integrated  (05/05/2024)  Stress: No Stress Concern Present (06/15/2023)  Tobacco Use: Medium Risk (05/06/2024)  Health Literacy: Adequate Health Literacy (06/15/2023)     Readmission Risk Interventions    05/07/2024    2:30 PM 03/27/2024   12:46 PM  Readmission Risk Prevention Plan  Transportation Screening Complete Complete  Medication Review (RN Care Manager) Complete Complete  PCP or Specialist appointment within 3-5 days of discharge  Complete  HRI or Home Care Consult Complete Complete  SW Recovery Care/Counseling Consult Complete Complete  Palliative Care Screening Not Applicable Not Applicable  Skilled Nursing Facility Not Applicable Complete

## 2024-05-08 NOTE — Progress Notes (Signed)
 Discharge meds in a secure bag delivered to patient by this RN.  Patient will return to [facility] via [facility]

## 2024-06-20 ENCOUNTER — Ambulatory Visit: Payer: Medicare Other
